# Patient Record
Sex: Female | Born: 1965
Health system: Southern US, Community
[De-identification: ages and names within clinical notes are randomized; demographics above are authoritative.]

## PROBLEM LIST (undated history)

## (undated) ENCOUNTER — Encounter

## (undated) ENCOUNTER — Ambulatory Visit

## (undated) ENCOUNTER — Encounter: Attending: Internal Medicine | Primary: Internal Medicine

## (undated) ENCOUNTER — Emergency Department (HOSPITAL_COMMUNITY): Payer: Medicare Other

## (undated) DIAGNOSIS — E119 Type 2 diabetes mellitus without complications: Secondary | ICD-10-CM

## (undated) DIAGNOSIS — I10 Essential (primary) hypertension: Secondary | ICD-10-CM

## (undated) DIAGNOSIS — I340 Nonrheumatic mitral (valve) insufficiency: Secondary | ICD-10-CM

## (undated) DIAGNOSIS — I739 Peripheral vascular disease, unspecified: Secondary | ICD-10-CM

## (undated) DIAGNOSIS — I5020 Unspecified systolic (congestive) heart failure: Secondary | ICD-10-CM

## (undated) DIAGNOSIS — E785 Hyperlipidemia, unspecified: Secondary | ICD-10-CM

## (undated) DIAGNOSIS — J90 Pleural effusion, not elsewhere classified: Secondary | ICD-10-CM

## (undated) DIAGNOSIS — N186 End stage renal disease: Secondary | ICD-10-CM

## (undated) HISTORY — PX: OTHER SURGICAL HISTORY: SHX169

## (undated) HISTORY — PX: TOE AMPUTATION: SHX809

## (undated) HISTORY — DX: Essential (primary) hypertension: I10

## (undated) HISTORY — DX: Hyperlipidemia, unspecified: E78.5

## (undated) HISTORY — PX: CHOLECYSTECTOMY: SHX55

## (undated) HISTORY — DX: Type 2 diabetes mellitus without complications: E11.9

---

## 2011-08-15 HISTORY — PX: COLONOSCOPY: SHX5424

## 2012-01-31 DIAGNOSIS — S98139A Complete traumatic amputation of one unspecified lesser toe, initial encounter: Secondary | ICD-10-CM

## 2012-01-31 HISTORY — DX: Complete traumatic amputation of one unspecified lesser toe, initial encounter: S98.139A

## 2013-09-11 DIAGNOSIS — I10 Essential (primary) hypertension: Secondary | ICD-10-CM | POA: Insufficient documentation

## 2013-09-11 DIAGNOSIS — E1121 Type 2 diabetes mellitus with diabetic nephropathy: Secondary | ICD-10-CM | POA: Insufficient documentation

## 2014-01-07 DIAGNOSIS — Z975 Presence of (intrauterine) contraceptive device: Secondary | ICD-10-CM | POA: Insufficient documentation

## 2014-04-01 DIAGNOSIS — Z8631 Personal history of diabetic foot ulcer: Secondary | ICD-10-CM | POA: Insufficient documentation

## 2014-04-01 HISTORY — DX: Personal history of diabetic foot ulcer: Z86.31

## 2014-08-28 LAB — HM COLONOSCOPY

## 2014-12-15 DIAGNOSIS — G4733 Obstructive sleep apnea (adult) (pediatric): Secondary | ICD-10-CM | POA: Insufficient documentation

## 2015-05-28 ENCOUNTER — Encounter: Payer: Self-pay | Admitting: Internal Medicine

## 2015-06-07 DIAGNOSIS — Z6841 Body Mass Index (BMI) 40.0 and over, adult: Secondary | ICD-10-CM | POA: Insufficient documentation

## 2015-07-09 ENCOUNTER — Other Ambulatory Visit: Payer: Self-pay | Admitting: Internal Medicine

## 2015-07-09 ENCOUNTER — Encounter: Payer: Self-pay | Admitting: Internal Medicine

## 2015-07-09 DIAGNOSIS — N189 Chronic kidney disease, unspecified: Secondary | ICD-10-CM | POA: Insufficient documentation

## 2015-07-09 DIAGNOSIS — E785 Hyperlipidemia, unspecified: Secondary | ICD-10-CM | POA: Insufficient documentation

## 2015-07-09 DIAGNOSIS — E1165 Type 2 diabetes mellitus with hyperglycemia: Secondary | ICD-10-CM

## 2015-07-09 DIAGNOSIS — E119 Type 2 diabetes mellitus without complications: Secondary | ICD-10-CM | POA: Insufficient documentation

## 2015-07-09 DIAGNOSIS — M179 Osteoarthritis of knee, unspecified: Secondary | ICD-10-CM | POA: Insufficient documentation

## 2015-07-09 DIAGNOSIS — E118 Type 2 diabetes mellitus with unspecified complications: Secondary | ICD-10-CM | POA: Insufficient documentation

## 2015-07-09 DIAGNOSIS — D631 Anemia in chronic kidney disease: Secondary | ICD-10-CM | POA: Insufficient documentation

## 2015-07-09 DIAGNOSIS — E1169 Type 2 diabetes mellitus with other specified complication: Secondary | ICD-10-CM | POA: Insufficient documentation

## 2015-07-09 DIAGNOSIS — E1122 Type 2 diabetes mellitus with diabetic chronic kidney disease: Secondary | ICD-10-CM | POA: Insufficient documentation

## 2015-07-09 DIAGNOSIS — M779 Enthesopathy, unspecified: Secondary | ICD-10-CM | POA: Insufficient documentation

## 2015-07-09 DIAGNOSIS — I130 Hypertensive heart and chronic kidney disease with heart failure and stage 1 through stage 4 chronic kidney disease, or unspecified chronic kidney disease: Secondary | ICD-10-CM | POA: Insufficient documentation

## 2015-07-09 DIAGNOSIS — J3089 Other allergic rhinitis: Secondary | ICD-10-CM | POA: Insufficient documentation

## 2015-07-09 DIAGNOSIS — N184 Chronic kidney disease, stage 4 (severe): Secondary | ICD-10-CM

## 2015-07-09 DIAGNOSIS — E1151 Type 2 diabetes mellitus with diabetic peripheral angiopathy without gangrene: Secondary | ICD-10-CM | POA: Insufficient documentation

## 2015-07-09 DIAGNOSIS — E559 Vitamin D deficiency, unspecified: Secondary | ICD-10-CM | POA: Insufficient documentation

## 2015-07-09 DIAGNOSIS — N186 End stage renal disease: Secondary | ICD-10-CM | POA: Insufficient documentation

## 2015-07-09 DIAGNOSIS — B009 Herpesviral infection, unspecified: Secondary | ICD-10-CM | POA: Insufficient documentation

## 2015-07-09 DIAGNOSIS — Z992 Dependence on renal dialysis: Secondary | ICD-10-CM | POA: Insufficient documentation

## 2015-07-09 DIAGNOSIS — M1710 Unilateral primary osteoarthritis, unspecified knee: Secondary | ICD-10-CM | POA: Insufficient documentation

## 2015-09-24 ENCOUNTER — Ambulatory Visit (INDEPENDENT_AMBULATORY_CARE_PROVIDER_SITE_OTHER): Payer: Federal, State, Local not specified - PPO | Admitting: Internal Medicine

## 2015-09-24 ENCOUNTER — Other Ambulatory Visit: Payer: Self-pay | Admitting: Internal Medicine

## 2015-09-24 ENCOUNTER — Encounter: Payer: Self-pay | Admitting: Internal Medicine

## 2015-09-24 VITALS — BP 124/70 | HR 80 | Ht 69.0 in | Wt 294.0 lb

## 2015-09-24 DIAGNOSIS — K5901 Slow transit constipation: Secondary | ICD-10-CM | POA: Diagnosis not present

## 2015-09-24 DIAGNOSIS — Z794 Long term (current) use of insulin: Secondary | ICD-10-CM

## 2015-09-24 DIAGNOSIS — E1122 Type 2 diabetes mellitus with diabetic chronic kidney disease: Secondary | ICD-10-CM

## 2015-09-24 DIAGNOSIS — E1121 Type 2 diabetes mellitus with diabetic nephropathy: Secondary | ICD-10-CM

## 2015-09-24 DIAGNOSIS — E1165 Type 2 diabetes mellitus with hyperglycemia: Secondary | ICD-10-CM | POA: Diagnosis not present

## 2015-09-24 DIAGNOSIS — N185 Chronic kidney disease, stage 5: Secondary | ICD-10-CM | POA: Insufficient documentation

## 2015-09-24 DIAGNOSIS — L739 Follicular disorder, unspecified: Secondary | ICD-10-CM | POA: Diagnosis not present

## 2015-09-24 DIAGNOSIS — E1129 Type 2 diabetes mellitus with other diabetic kidney complication: Secondary | ICD-10-CM | POA: Insufficient documentation

## 2015-09-24 DIAGNOSIS — N184 Chronic kidney disease, stage 4 (severe): Secondary | ICD-10-CM

## 2015-09-24 DIAGNOSIS — IMO0002 Reserved for concepts with insufficient information to code with codable children: Secondary | ICD-10-CM

## 2015-09-24 MED ORDER — LUBIPROSTONE 24 MCG PO CAPS: 24.0000 ug | ORAL_CAPSULE | Freq: Two times a day (BID) | ORAL | Status: DC

## 2015-09-24 MED ORDER — LEVOFLOXACIN 250 MG PO TABS: 250.0000 mg | ORAL_TABLET | Freq: Every day | ORAL | Status: DC

## 2015-09-24 NOTE — Progress Notes (Signed)
Date:  09/24/2015   Name:  Cindy Osborne   DOB:  01-23-1966   MRN:  [id number]   Chief Complaint: Abscess Patient developed infection under her left arm.  The area is tender and swollen but not draining.  She also has a smaller lesion under her right breast. Constipation - Patient plans constipation. The past several years she's had more difficulty despite using MiraLAX on a daily basis. If she does not take a laxative she has a bowel movement less than one time per week. She gets uncomfortable with upper abdominal fullness. She denies vomiting or blood in the stool. She has never tried another medication and is uncertain what to take with her renal disease. Renal insufficiency stage IV - she sees nephrology on a regular basis. She reports that her renal function is stable. She was declined the transplant list. She has a graft in place if dialysis is needed. Type 2 diabetes uncontrolled - she admits to poor control in part due to her diet. She sees endocrinology regularly. Her insulin was recently changed to NovoLog mix due to insurance issues.    Review of Systems  Constitutional: Negative for fever, chills and fatigue.  Eyes: Negative for visual disturbance.  Respiratory: Negative for cough, shortness of breath and wheezing.   Cardiovascular: Negative for chest pain and palpitations.  Gastrointestinal: Positive for constipation and abdominal distention. Negative for nausea, vomiting and blood in stool.  Genitourinary: Negative for dysuria.  Musculoskeletal: Negative for back pain.  Skin: Positive for color change and wound.    Patient Active Problem List   Diagnosis Date Noted  . DM (diabetes mellitus), type 2, uncontrolled, with renal complications ([place]) [id number]  . Allergic rhinitis 07/09/2015  . Anemia associated with chronic renal failure 07/09/2015  . Chronic kidney disease, stage IV (severe) ([place]) 07/09/2015  . Uncontrolled type 2 diabetes mellitus with diabetic  peripheral angiopathy without gangrene ([place]) 07/09/2015  . Herpes simplex infection 07/09/2015  . Hypercholesterolemia 07/09/2015  . Heart & renal disease, hypertensive, with heart failure ([place]) 07/09/2015  . Arthritis of knee, degenerative 07/09/2015  . Tendinitis 07/09/2015  . Avitaminosis D 07/09/2015  . Body mass index (BMI) of 40.0-44.9 in adult ([place]) 06/07/2015  . Obstructive apnea 12/15/2014  . History of diabetic ulcer of foot 04/01/2014  . Intracervical pessary 01/07/2014  . Hypertension 09/11/2013  . Amputated toe ([place]) 01/31/2012    Prior to Admission medications   Medication Sig Start Date End Date Taking? Authorizing Provider  amLODipine (NORVASC) 10 MG tablet Take 1 tablet by mouth daily.   Yes Historical Provider, MD  atorvastatin (LIPITOR) 10 MG tablet Take 1 tablet by mouth at bedtime. 09/21/15  Yes Historical Provider, MD  HUMULIN 70/30 KWIKPEN (70-30) 100 UNIT/ML PEN Dr Michiel Sites 08/04/15  Yes Historical Provider, MD  hydrochlorothiazide (HYDRODIURIL) 50 MG tablet Take 2 tablets by mouth daily.    Yes Historical Provider, MD  levonorgestrel (MIRENA, 52 MG,) 20 MCG/24HR IUD by Intrauterine route.   Yes Historical Provider, MD  lisinopril (PRINIVIL,ZESTRIL) 40 MG tablet Take 40 mg by mouth 2 (two) times daily. 09/07/15  Yes Historical Provider, MD  metoprolol succinate (TOPROL-XL) 100 MG 24 hr tablet Take 1.5 tablets by mouth daily. 06/04/15  Yes Historical Provider, MD  Vitamin D, Ergocalciferol, (DRISDOL) 50000 UNITS CAPS capsule Take 1 capsule by mouth once a week.   Yes Historical Provider, MD    Allergies  Allergen Reactions  . Atorvastatin     Other reaction(s): Other (See Comments)  Back pain  . Peanut-Containing Drug Products Itching  . Pioglitazone     Other reaction(s): Edema  . Rosuvastatin Itching  . Shellfish-Derived Products Rash    shrimp  . Sulfa Antibiotics Rash    Past Surgical History  Procedure Laterality Date  . Cholecystectomy    . Toe  amputation    . Avg for dialysis    . Colonoscopy  08/15/2011    cleared for 10 yrs    Social History  Substance Use Topics  . Smoking status: Never Smoker   . Smokeless tobacco: None  . Alcohol Use: 0.0 oz/week    0 Standard drinks or equivalent per week     Medication list has been reviewed and updated.   Physical Exam  Constitutional: She is oriented to person, place, and time. She appears well-developed. No distress.  HENT:  Head: Normocephalic and atraumatic.  Cardiovascular: Normal rate, regular rhythm and normal heart sounds.   Pulmonary/Chest: Effort normal and breath sounds normal. No respiratory distress.  Abdominal: Soft. Bowel sounds are normal.  Neurological: She is alert and oriented to person, place, and time.  Skin: Skin is warm and dry. No rash noted.     Psychiatric: She has a normal mood and affect. Her behavior is normal. Thought content normal.    BP 124/70 mmHg  Pulse 80  Ht 5\' 9"  (1.753 m)  Wt 294 lb (133.358 kg)  BMI 43.40 kg/m2  Assessment and Plan: 1. Folliculitis Renal dose levofloxacin Topical antibiotic cream - levofloxacin (LEVAQUIN) 250 MG tablet; Take 1 tablet (250 mg total) by mouth daily.  Dispense: 8 tablet; Refill: 0 (instructed differently due to renal insuff)  2. Uncontrolled type 2 diabetes mellitus with diabetic nephropathy, with long-term current use of insulin ([place]) Followed by Endocrinology  3. Chronic kidney disease, stage IV (severe) (HCC) Stable GFR 19; followed by Nephrology  4. Slow transit constipation Samples plus Rx of amitiza - lubiprostone (AMITIZA) 24 MCG capsule; Take 1 capsule (24 mcg total) by mouth 2 (two) times daily with a meal.  Dispense: 60 capsule; Refill: [place], MD [facility]  09/24/2015

## 2015-09-24 NOTE — Patient Instructions (Addendum)
Levofloxacin 250 mg - take 2 pills on the first day then one pill every 2 days until gone.  Amitiza - 24 mcg twice a day with food  Constipation, Adult Constipation is when a person has fewer than three bowel movements a week, has difficulty having a bowel movement, or has stools that are dry, hard, or larger than normal. As people grow older, constipation is more common. A low-fiber diet, not taking in enough fluids, and taking certain medicines may make constipation worse.  CAUSES   Certain medicines, such as antidepressants, pain medicine, iron supplements, antacids, and water pills.   Certain diseases, such as diabetes, irritable bowel syndrome (IBS), thyroid disease, or depression.   Not drinking enough water.   Not eating enough fiber-rich foods.   Stress or travel.   Lack of physical activity or exercise.   Ignoring the urge to have a bowel movement.   Using laxatives too much.  SIGNS AND SYMPTOMS   Having fewer than three bowel movements a week.   Straining to have a bowel movement.   Having stools that are hard, dry, or larger than normal.   Feeling full or bloated.   Pain in the lower abdomen.   Not feeling relief after having a bowel movement.  DIAGNOSIS  Your health care provider will take a medical history and perform a physical exam. Further testing may be done for severe constipation. Some tests may include:  A barium enema X-ray to examine your rectum, colon, and, sometimes, your small intestine.   A sigmoidoscopy to examine your lower colon.   A colonoscopy to examine your entire colon. TREATMENT  Treatment will depend on the severity of your constipation and what is causing it. Some dietary treatments include drinking more fluids and eating more fiber-rich foods. Lifestyle treatments may include regular exercise. If these diet and lifestyle recommendations do not help, your health care provider may recommend taking over-the-counter  laxative medicines to help you have bowel movements. Prescription medicines may be prescribed if over-the-counter medicines do not work.  HOME CARE INSTRUCTIONS   Eat foods that have a lot of fiber, such as fruits, vegetables, whole grains, and beans.  Limit foods high in fat and processed sugars, such as french fries, hamburgers, cookies, candies, and soda.   A fiber supplement may be added to your diet if you cannot get enough fiber from foods.   Drink enough fluids to keep your urine clear or pale yellow.   Exercise regularly or as directed by your health care provider.   Go to the restroom when you have the urge to go. Do not hold it.   Only take over-the-counter or prescription medicines as directed by your health care provider. Do not take other medicines for constipation without talking to your health care provider first.  [place] IF:   You have bright red blood in your stool.   Your constipation lasts for more than 4 days or gets worse.   You have abdominal or rectal pain.   You have thin, pencil-like stools.   You have unexplained weight loss. MAKE SURE YOU:   Understand these instructions.  Will watch your condition.  Will get help right away if you are not doing well or get worse.   This information is not intended to replace advice given to you by your health care provider. Make sure you discuss any questions you have with your health care provider.   Document Released: 04/28/2004 Document Revised: 08/21/2014 Document  Reviewed: 05/12/2013 Elsevier Interactive Patient Education [employer].

## 2015-11-12 ENCOUNTER — Ambulatory Visit (INDEPENDENT_AMBULATORY_CARE_PROVIDER_SITE_OTHER): Payer: Federal, State, Local not specified - PPO | Admitting: Internal Medicine

## 2015-11-12 ENCOUNTER — Encounter: Payer: Self-pay | Admitting: Internal Medicine

## 2015-11-12 VITALS — BP 118/70 | HR 102 | Temp 99.8°F | Ht 69.0 in | Wt 292.0 lb

## 2015-11-12 DIAGNOSIS — J40 Bronchitis, not specified as acute or chronic: Secondary | ICD-10-CM | POA: Diagnosis not present

## 2015-11-12 DIAGNOSIS — K5901 Slow transit constipation: Secondary | ICD-10-CM | POA: Diagnosis not present

## 2015-11-12 DIAGNOSIS — J101 Influenza due to other identified influenza virus with other respiratory manifestations: Secondary | ICD-10-CM

## 2015-11-12 LAB — POCT INFLUENZA A/B
INFLUENZA B, POC: NEGATIVE
Influenza A, POC: POSITIVE — AB

## 2015-11-12 MED ORDER — DOXYCYCLINE HYCLATE 100 MG PO TABS: 100.0000 mg | ORAL_TABLET | Freq: Two times a day (BID) | ORAL | Status: DC

## 2015-11-12 MED ORDER — LINACLOTIDE 290 MCG PO CAPS: 290.0000 ug | ORAL_CAPSULE | Freq: Every day | ORAL | Status: DC

## 2015-11-12 MED ORDER — HYDROCOD POLST-CPM POLST ER 10-8 MG/5ML PO SUER: 5.0000 mL | Freq: Two times a day (BID) | ORAL | Status: DC

## 2015-11-12 NOTE — Patient Instructions (Signed)

## 2015-11-12 NOTE — Progress Notes (Signed)
Date:  11/12/2015   Name:  Cindy Osborne   DOB:  [birth year]   MRN:  [id number]   Chief Complaint: Fever Fever  This is a new problem. The current episode started yesterday. The problem has been unchanged. The maximum temperature noted was 99 to 99.9 F. The temperature was taken using an oral thermometer. Associated symptoms include chest pain, congestion, coughing, headaches and muscle aches. Pertinent negatives include no diarrhea or sore throat. She has tried acetaminophen for the symptoms.   Constipation - did well with Amitiza but could not afford it.  Review of Systems  Constitutional: Positive for fever.  HENT: Positive for congestion and sinus pressure. Negative for sore throat and tinnitus.   Eyes: Negative for visual disturbance.  Respiratory: Positive for cough.   Cardiovascular: Positive for chest pain.  Gastrointestinal: Positive for constipation. Negative for diarrhea and blood in stool.  Musculoskeletal: Positive for myalgias. Negative for arthralgias.  Neurological: Positive for headaches. Negative for tremors and numbness.    Patient Active Problem List   Diagnosis Date Noted  . Uncontrolled type 2 diabetes mellitus with diabetic nephropathy, with long-term current use of insulin ([place]) 09/24/2015  . Slow transit constipation 09/24/2015  . Allergic rhinitis 07/09/2015  . Anemia associated with chronic renal failure 07/09/2015  . Chronic kidney disease, stage IV (severe) ([place]) 07/09/2015  . Uncontrolled type 2 diabetes mellitus with diabetic peripheral angiopathy without gangrene ([place]) 07/09/2015  . Herpes simplex infection 07/09/2015  . Hypercholesterolemia 07/09/2015  . Heart & renal disease, hypertensive, with heart failure ([place]) 07/09/2015  . Arthritis of knee, degenerative 07/09/2015  . Tendinitis 07/09/2015  . Avitaminosis D 07/09/2015  . Body mass index (BMI) of 40.0-44.9 in adult ([place]) 06/07/2015  . Obstructive apnea 12/15/2014  . History of diabetic  ulcer of foot 04/01/2014  . Intracervical pessary 01/07/2014  . Hypertension 09/11/2013  . Amputated toe ([place]) 01/31/2012    Prior to Admission medications   Medication Sig Start Date End Date Taking? Authorizing Provider  amLODipine (NORVASC) 10 MG tablet Take 1 tablet by mouth daily.   Yes Historical Provider, MD  atorvastatin (LIPITOR) 10 MG tablet Take 1 tablet by mouth at bedtime. 09/21/15  Yes Historical Provider, MD  HUMULIN 70/30 KWIKPEN (70-30) 100 UNIT/ML PEN Dr Michiel Sites 08/04/15  Yes Historical Provider, MD  hydrochlorothiazide (HYDRODIURIL) 50 MG tablet Take 2 tablets by mouth daily.    Yes Historical Provider, MD  levonorgestrel (MIRENA, 52 MG,) 20 MCG/24HR IUD by Intrauterine route.   Yes Historical Provider, MD  lisinopril (PRINIVIL,ZESTRIL) 40 MG tablet Take 40 mg by mouth 2 (two) times daily. 09/07/15  Yes Historical Provider, MD  lubiprostone (AMITIZA) 24 MCG capsule Take 1 capsule (24 mcg total) by mouth 2 (two) times daily with a meal. 09/24/15  Yes Glean Hess, MD  metoprolol succinate (TOPROL-XL) 100 MG 24 hr tablet Take 1.5 tablets by mouth daily. 06/04/15  Yes Historical Provider, MD  Vitamin D, Ergocalciferol, (DRISDOL) 50000 UNITS CAPS capsule Take 1 capsule by mouth once a week.   Yes Historical Provider, MD    Allergies  Allergen Reactions  . Atorvastatin     Other reaction(s): Other (See Comments) Back pain  . Peanut-Containing Drug Products Itching  . Pioglitazone     Other reaction(s): Edema  . Rosuvastatin Itching  . Shellfish-Derived Products Rash    shrimp  . Sulfa Antibiotics Rash    Past Surgical History  Procedure Laterality Date  . Cholecystectomy    . Toe amputation    .  Avg for dialysis    . Colonoscopy  08/15/2011    cleared for 10 yrs    Social History  Substance Use Topics  . Smoking status: Never Smoker   . Smokeless tobacco: None  . Alcohol Use: 0.0 oz/week    0 Standard drinks or equivalent per week     Medication list has  been reviewed and updated.   Physical Exam  Constitutional: She is oriented to person, place, and time. She appears well-developed and well-nourished. No distress.  HENT:  Head: Normocephalic and atraumatic.  Neck: Normal range of motion. Neck supple. No thyromegaly present.  Cardiovascular: Normal rate, regular rhythm and normal heart sounds.   Pulmonary/Chest: Effort normal. No respiratory distress. She has decreased breath sounds. She has no wheezes.  Musculoskeletal: Normal range of motion.  Lymphadenopathy:    She has no cervical adenopathy.  Neurological: She is alert and oriented to person, place, and time.  Skin: Skin is warm and dry. No rash noted.  Psychiatric: She has a normal mood and affect. Her speech is normal and behavior is normal. Thought content normal.  Nursing note and vitals reviewed.   BP 118/70 mmHg  Pulse 102  Temp(Src) 99.8 F (37.7 C) (Oral)  Ht 5\' 9"  (1.753 m)  Wt 292 lb (132.45 kg)  BMI 43.10 kg/m2  SpO2 99%  Assessment and Plan: 1. Influenza A Increase fluids, rest, out of work until 11/16/15 - chlorpheniramine-HYDROcodone (TUSSIONEX PENNKINETIC ER) 10-8 MG/5ML SUER; Take 5 mLs by mouth 2 (two) times daily.  Dispense: 473 mL; Refill: 0  2. Bronchitis - doxycycline (VIBRA-TABS) 100 MG tablet; Take 1 tablet (100 mg total) by mouth 2 (two) times daily.  Dispense: 20 tablet; Refill: 0  3. Slow transit constipation Samples of Linzess given - call for Rx if helpful  Halina Maidens, MD [facility]  11/12/2015

## 2016-01-12 ENCOUNTER — Ambulatory Visit (INDEPENDENT_AMBULATORY_CARE_PROVIDER_SITE_OTHER): Payer: Federal, State, Local not specified - PPO | Admitting: Internal Medicine

## 2016-01-12 ENCOUNTER — Encounter: Payer: Self-pay | Admitting: Internal Medicine

## 2016-01-12 VITALS — BP 144/78 | HR 88 | Ht 69.0 in | Wt 295.0 lb

## 2016-01-12 DIAGNOSIS — J018 Other acute sinusitis: Secondary | ICD-10-CM

## 2016-01-12 DIAGNOSIS — K5901 Slow transit constipation: Secondary | ICD-10-CM | POA: Diagnosis not present

## 2016-01-12 DIAGNOSIS — H109 Unspecified conjunctivitis: Secondary | ICD-10-CM | POA: Diagnosis not present

## 2016-01-12 MED ORDER — LINACLOTIDE 290 MCG PO CAPS: 290.0000 ug | ORAL_CAPSULE | Freq: Every day | ORAL | Status: DC

## 2016-01-12 MED ORDER — NEOMYCIN-POLYMYXIN-DEXAMETH 3.5-10000-0.1 OP SUSP: 2.0000 [drp] | Freq: Four times a day (QID) | OPHTHALMIC | Status: DC

## 2016-01-12 MED ORDER — AMOXICILLIN-POT CLAVULANATE 875-125 MG PO TABS: 1.0000 | ORAL_TABLET | Freq: Two times a day (BID) | ORAL | Status: DC

## 2016-01-12 NOTE — Progress Notes (Signed)
Date:  01/12/2016   Name:  Cindy Osborne   DOB:  1965/08/19   MRN:  [id number]   Chief Complaint: Sinusitis Sinusitis This is a new problem. The current episode started 1 to 4 weeks ago. The problem is unchanged. There has been no fever. Associated symptoms include congestion and sinus pressure. Pertinent negatives include no coughing or shortness of breath. (And foul odor from nares)  Conjunctivitis  The current episode started yesterday. Associated symptoms include eye itching, constipation, congestion, eye discharge, eye pain and eye redness. Pertinent negatives include no fever and no cough. The right eye is affected.  Constipation This is a chronic problem. The problem has been rapidly improving (after using Linzess) since onset. Pertinent negatives include no fever.     Review of Systems  Constitutional: Negative for fever.  HENT: Positive for congestion, postnasal drip and sinus pressure.   Eyes: Positive for pain, discharge, redness and itching.  Respiratory: Negative for cough, chest tightness and shortness of breath.   Cardiovascular: Negative for chest pain and palpitations.  Gastrointestinal: Positive for constipation.    Patient Active Problem List   Diagnosis Date Noted  . Uncontrolled type 2 diabetes mellitus with diabetic nephropathy, with long-term current use of insulin ([place]) 09/24/2015  . Slow transit constipation 09/24/2015  . Allergic rhinitis 07/09/2015  . Anemia associated with chronic renal failure 07/09/2015  . Chronic kidney disease, stage IV (severe) ([place]) 07/09/2015  . Uncontrolled type 2 diabetes mellitus with diabetic peripheral angiopathy without gangrene ([place]) 07/09/2015  . Herpes simplex infection 07/09/2015  . Hypercholesterolemia 07/09/2015  . Heart & renal disease, hypertensive, with heart failure ([place]) 07/09/2015  . Arthritis of knee, degenerative 07/09/2015  . Tendinitis 07/09/2015  . Avitaminosis D 07/09/2015  . Body mass index (BMI)  of 40.0-44.9 in adult ([place]) 06/07/2015  . Obstructive apnea 12/15/2014  . History of diabetic ulcer of foot 04/01/2014  . Intracervical pessary 01/07/2014  . Hypertension 09/11/2013  . Amputated toe ([place]) 01/31/2012    Prior to Admission medications   Medication Sig Start Date End Date Taking? Authorizing Provider  amLODipine (NORVASC) 10 MG tablet Take 1 tablet by mouth daily.   Yes Historical Provider, MD  atorvastatin (LIPITOR) 10 MG tablet Take 1 tablet by mouth at bedtime. 09/21/15  Yes Historical Provider, MD  chlorpheniramine-HYDROcodone (TUSSIONEX PENNKINETIC ER) 10-8 MG/5ML SUER Take 5 mLs by mouth 2 (two) times daily. 11/12/15  Yes Glean Hess, MD  HUMULIN 70/30 KWIKPEN (70-30) 100 UNIT/ML PEN Dr Michiel Sites 08/04/15  Yes Historical Provider, MD  hydrochlorothiazide (HYDRODIURIL) 50 MG tablet Take 2 tablets by mouth daily.    Yes Historical Provider, MD  levonorgestrel (MIRENA, 52 MG,) 20 MCG/24HR IUD by Intrauterine route.   Yes Historical Provider, MD  Linaclotide (LINZESS) 290 MCG CAPS capsule Take 1 capsule (290 mcg total) by mouth daily. 11/12/15  Yes Glean Hess, MD  lisinopril (PRINIVIL,ZESTRIL) 40 MG tablet Take 40 mg by mouth 2 (two) times daily. 09/07/15  Yes Historical Provider, MD  metoprolol succinate (TOPROL-XL) 100 MG 24 hr tablet Take 1.5 tablets by mouth daily. 06/04/15  Yes Historical Provider, MD  Vitamin D, Ergocalciferol, (DRISDOL) 50000 UNITS CAPS capsule Take 1 capsule by mouth once a week.   Yes Historical Provider, MD    Allergies  Allergen Reactions  . Atorvastatin     Other reaction(s): Other (See Comments) Back pain  . Peanut-Containing Drug Products Itching  . Pioglitazone     Other reaction(s): Edema  . Rosuvastatin  Itching  . Shellfish-Derived Products Rash    shrimp  . Sulfa Antibiotics Rash    Past Surgical History  Procedure Laterality Date  . Cholecystectomy    . Toe amputation    . Avg for dialysis    . Colonoscopy  08/15/2011     cleared for 10 yrs    Social History  Substance Use Topics  . Smoking status: Never Smoker   . Smokeless tobacco: None  . Alcohol Use: 0.0 oz/week    0 Standard drinks or equivalent per week    Medication list has been reviewed and updated.   Physical Exam  Constitutional: She is oriented to person, place, and time. She appears well-developed and well-nourished.  HENT:  Right Ear: External ear and ear canal normal. Tympanic membrane is not erythematous and not retracted.  Left Ear: External ear and ear canal normal. Tympanic membrane is not erythematous and not retracted.  Nose: Right sinus exhibits maxillary sinus tenderness and frontal sinus tenderness. Left sinus exhibits maxillary sinus tenderness and frontal sinus tenderness.  Mouth/Throat: Uvula is midline and mucous membranes are normal. No oral lesions. No oropharyngeal exudate or posterior oropharyngeal erythema.  Eyes: Right conjunctiva is injected. Right conjunctiva has no hemorrhage. Right eye exhibits no nystagmus.  Cardiovascular: Normal rate, regular rhythm and normal heart sounds.   Pulmonary/Chest: Breath sounds normal. She has no wheezes. She has no rales.  Musculoskeletal: She exhibits no edema or tenderness.  Lymphadenopathy:    She has no cervical adenopathy.  Neurological: She is alert and oriented to person, place, and time.  Psychiatric: She has a normal mood and affect. Her speech is normal.    BP 144/78 mmHg  Pulse 88  Ht 5\' 9"  (1.753 m)  Wt 295 lb (133.811 kg)  BMI 43.54 kg/m2  Assessment and Plan: 1. Conjunctivitis of right eye Continue warm compresses as needed - neomycin-polymyxin b-dexamethasone (MAXITROL) 3.5-10000-0.1 SUSP; Place 2 drops into the right eye every 6 (six) hours.  Dispense: 5 mL; Refill: 0  2. Other acute sinusitis - amoxicillin-clavulanate (AUGMENTIN) 875-125 MG tablet; Take 1 tablet by mouth 2 (two) times daily.  Dispense: 20 tablet; Refill: 0  3. Slow transit  constipation - linaclotide (LINZESS) 290 MCG CAPS capsule; Take 1 capsule (290 mcg total) by mouth daily.  Dispense: 30 capsule; Refill: [place], MD [facility]  01/12/2016

## 2016-01-26 ENCOUNTER — Telehealth: Payer: Self-pay

## 2016-01-26 NOTE — Telephone Encounter (Signed)
Pt called said she was recently seen, pt said she went to the eye doctor due to her eyes being puffy in the morning and swelling at night time and gave her eye drops, pt said her eye drops are helping throughout the day but start backs at night time pt is wanting to know if she could possibly have a ct scan done.

## 2016-01-26 NOTE — Telephone Encounter (Signed)
Cindy Osborne called and advised to go to [facility]

## 2016-05-26 DIAGNOSIS — D631 Anemia in chronic kidney disease: Secondary | ICD-10-CM | POA: Diagnosis not present

## 2016-05-26 DIAGNOSIS — N185 Chronic kidney disease, stage 5: Secondary | ICD-10-CM | POA: Diagnosis not present

## 2016-05-31 DIAGNOSIS — E1140 Type 2 diabetes mellitus with diabetic neuropathy, unspecified: Secondary | ICD-10-CM | POA: Diagnosis not present

## 2016-05-31 DIAGNOSIS — N185 Chronic kidney disease, stage 5: Secondary | ICD-10-CM | POA: Diagnosis not present

## 2016-05-31 DIAGNOSIS — I129 Hypertensive chronic kidney disease with stage 1 through stage 4 chronic kidney disease, or unspecified chronic kidney disease: Secondary | ICD-10-CM | POA: Diagnosis not present

## 2016-05-31 DIAGNOSIS — D631 Anemia in chronic kidney disease: Secondary | ICD-10-CM | POA: Diagnosis not present

## 2016-06-20 ENCOUNTER — Encounter: Payer: Self-pay | Admitting: Internal Medicine

## 2016-06-20 ENCOUNTER — Ambulatory Visit (INDEPENDENT_AMBULATORY_CARE_PROVIDER_SITE_OTHER): Payer: Federal, State, Local not specified - PPO | Admitting: Internal Medicine

## 2016-06-20 VITALS — BP 142/80 | HR 70 | Ht 69.0 in | Wt 298.0 lb

## 2016-06-20 DIAGNOSIS — H1033 Unspecified acute conjunctivitis, bilateral: Secondary | ICD-10-CM | POA: Diagnosis not present

## 2016-06-20 DIAGNOSIS — E1165 Type 2 diabetes mellitus with hyperglycemia: Secondary | ICD-10-CM

## 2016-06-20 DIAGNOSIS — Z794 Long term (current) use of insulin: Secondary | ICD-10-CM

## 2016-06-20 DIAGNOSIS — E1121 Type 2 diabetes mellitus with diabetic nephropathy: Secondary | ICD-10-CM

## 2016-06-20 DIAGNOSIS — J3089 Other allergic rhinitis: Secondary | ICD-10-CM

## 2016-06-20 DIAGNOSIS — IMO0002 Reserved for concepts with insufficient information to code with codable children: Secondary | ICD-10-CM

## 2016-06-20 DIAGNOSIS — L989 Disorder of the skin and subcutaneous tissue, unspecified: Secondary | ICD-10-CM

## 2016-06-20 DIAGNOSIS — N184 Chronic kidney disease, stage 4 (severe): Secondary | ICD-10-CM | POA: Diagnosis not present

## 2016-06-20 MED ORDER — CIPROFLOXACIN HCL 0.3 % OP SOLN: 2.0000 [drp] | OPHTHALMIC | 1 refills | Status: DC

## 2016-06-20 MED ORDER — BENZONATATE 100 MG PO CAPS: 200.0000 mg | ORAL_CAPSULE | Freq: Every day | ORAL | 5 refills | Status: DC

## 2016-06-20 NOTE — Progress Notes (Signed)
Date:  06/20/2016   Name:  Cindy Osborne   DOB:  Jan 01, 1966   MRN:  [id number]   Chief Complaint: Allergic Rhinitis  (both eyes swollen with "gunk in both" more in the mornings.) and Cyst (several keep popping up at bra line, heat compress helps them go down.)  Eye Problem   Both eyes are affected.This is a recurrent problem. The problem has been waxing and waning. There was no injury mechanism. The pain is mild. Associated symptoms include blurred vision, an eye discharge, eye redness and itching. Pertinent negatives include no fever.  Rash  This is a recurrent (she gets swellings under her breast from bra - she uses warm compresses and they go away after a few days.  There is no drainage or odor.  They receed.) problem. The affected locations include the chest (boils under breast). Associated symptoms include coughing. Pertinent negatives include no congestion, fatigue, fever, shortness of breath or sore throat.  Diabetes  She presents for her follow-up diabetic visit. She has type 2 diabetes mellitus. Her disease course has been improving. Pertinent negatives for hypoglycemia include no nervousness/anxiousness. Associated symptoms include blurred vision. Pertinent negatives for diabetes include no chest pain and no fatigue. Compliance with diabetes treatment: last A1C at [facility] 8.1.  Allergies - has been tested and has no specific allergies.  She takes claritin daily.  She also has post nasal drainage which is worse at night and causes a cough.  She has uses Tussionex for some time at night and requests a refill.  I advised it is not prudent to take this type of medication indefinitely.   Review of Systems  Constitutional: Negative for chills, fatigue and fever.  HENT: Positive for postnasal drip. Negative for congestion, sinus pressure, sore throat and trouble swallowing.   Eyes: Positive for blurred vision, discharge, redness and itching.  Respiratory: Positive for cough. Negative for  chest tightness, shortness of breath and wheezing.   Cardiovascular: Negative for chest pain and palpitations.  Gastrointestinal: Negative for abdominal pain.  Skin: Positive for rash.  Psychiatric/Behavioral: Positive for sleep disturbance. Negative for dysphoric mood. The patient is not nervous/anxious.     Patient Active Problem List   Diagnosis Date Noted  . Uncontrolled type 2 diabetes mellitus with diabetic nephropathy, with long-term current use of insulin ([place]) 09/24/2015  . Slow transit constipation 09/24/2015  . Allergic rhinitis 07/09/2015  . Anemia associated with chronic renal failure 07/09/2015  . Chronic kidney disease, stage IV (severe) ([place]) 07/09/2015  . Uncontrolled type 2 diabetes mellitus with diabetic peripheral angiopathy without gangrene ([place]) 07/09/2015  . Herpes simplex infection 07/09/2015  . Hypercholesterolemia 07/09/2015  . Heart & renal disease, hypertensive, with heart failure ([place]) 07/09/2015  . Arthritis of knee, degenerative 07/09/2015  . Tendinitis 07/09/2015  . Avitaminosis D 07/09/2015  . Body mass index (BMI) of 40.0-44.9 in adult ([place]) 06/07/2015  . Obstructive apnea 12/15/2014  . History of diabetic ulcer of foot 04/01/2014  . Intracervical pessary 01/07/2014  . Hypertension 09/11/2013  . Amputated toe ([place]) 01/31/2012    Prior to Admission medications   Medication Sig Start Date End Date Taking? Authorizing Provider  amLODipine (NORVASC) 10 MG tablet Take 1 tablet by mouth daily.   Yes Historical Provider, MD  amoxicillin-clavulanate (AUGMENTIN) 875-125 MG tablet Take 1 tablet by mouth 2 (two) times daily. 01/12/16  Yes Glean Hess, MD  atorvastatin (LIPITOR) 10 MG tablet Take 1 tablet by mouth at bedtime. 09/21/15  Yes Historical Provider,  MD  chlorpheniramine-HYDROcodone (TUSSIONEX PENNKINETIC ER) 10-8 MG/5ML SUER Take 5 mLs by mouth 2 (two) times daily. 11/12/15  Yes Glean Hess, MD  HUMULIN 70/30 KWIKPEN (70-30) 100 UNIT/ML PEN Dr  Michiel Sites 08/04/15  Yes Historical Provider, MD  hydrochlorothiazide (HYDRODIURIL) 50 MG tablet Take 2 tablets by mouth daily.    Yes Historical Provider, MD  levonorgestrel (MIRENA, 52 MG,) 20 MCG/24HR IUD by Intrauterine route.   Yes Historical Provider, MD  linaclotide (LINZESS) 290 MCG CAPS capsule Take 1 capsule (290 mcg total) by mouth daily. 01/12/16  Yes Glean Hess, MD  lisinopril (PRINIVIL,ZESTRIL) 40 MG tablet Take 40 mg by mouth 2 (two) times daily. 09/07/15  Yes Historical Provider, MD  metoprolol succinate (TOPROL-XL) 100 MG 24 hr tablet Take 1.5 tablets by mouth daily. 06/04/15  Yes Historical Provider, MD  neomycin-polymyxin b-dexamethasone (MAXITROL) 3.5-10000-0.1 SUSP Place 2 drops into the right eye every 6 (six) hours. 01/12/16  Yes Glean Hess, MD  Vitamin D, Ergocalciferol, (DRISDOL) 50000 UNITS CAPS capsule Take 1 capsule by mouth once a week.   Yes Historical Provider, MD    Allergies  Allergen Reactions  . Atorvastatin     Other reaction(s): Other (See Comments) Back pain  . Peanut-Containing Drug Products Itching  . Pioglitazone     Other reaction(s): Edema  . Rosuvastatin Itching  . Shellfish-Derived Products Rash    shrimp  . Sulfa Antibiotics Rash    Past Surgical History:  Procedure Laterality Date  . avg for dialysis    . CHOLECYSTECTOMY    . COLONOSCOPY  08/15/2011   cleared for 10 yrs  . TOE AMPUTATION      Social History  Substance Use Topics  . Smoking status: Never Smoker  . Smokeless tobacco: Never Used  . Alcohol use 0.0 oz/week     Medication list has been reviewed and updated.   Physical Exam  Constitutional: She is oriented to person, place, and time. She appears well-developed. No distress.  HENT:  Head: Normocephalic and atraumatic.  Eyes:  Eyelids swollen and inflamed.  Crusting seen on both eyes.  Conjunctiva red and tearing.  Neck: Normal range of motion. No thyromegaly present.  Cardiovascular: Normal rate, regular  rhythm and normal heart sounds.   Pulmonary/Chest: Effort normal and breath sounds normal. No respiratory distress. She has no wheezes.  Musculoskeletal: Normal range of motion.  Neurological: She is alert and oriented to person, place, and time.  Skin: Skin is warm and dry. No rash noted.  Skin under breasts is normal - no color change, rash or lesions.  Psychiatric: She has a normal mood and affect. Her speech is normal and behavior is normal. Thought content normal.  Nursing note and vitals reviewed.   BP (!) 142/80   Pulse 70   Ht 5\' 9"  (1.753 m)   Wt 298 lb (135.2 kg)   SpO2 100%   BMI 44.01 kg/m   Assessment and Plan: 1. Environmental and seasonal allergies Continue claritin Try tessalon instead of tussionex - benzonatate (TESSALON) 100 MG capsule; Take 2 capsules (200 mg total) by mouth at bedtime.  Dispense: 30 capsule; Refill: 5  2. Acute conjunctivitis of both eyes, unspecified acute conjunctivitis type - ciprofloxacin (CILOXAN) 0.3 % ophthalmic solution; Place 2 drops into both eyes every 4 (four) hours while awake.  Dispense: 10 mL; Refill: 1  3. Uncontrolled type 2 diabetes mellitus with diabetic nephropathy, with long-term current use of insulin ([place]) Continue [facility] at [facility]  4.  Chronic kidney disease, stage IV (severe) (HCC) Stable - followed by Nephrology  5. Skin lesion of chest wall Uncertain significance - could consult Dermatology   Halina Maidens, MD [facility]  06/20/2016

## 2016-06-29 DIAGNOSIS — H1033 Unspecified acute conjunctivitis, bilateral: Secondary | ICD-10-CM | POA: Diagnosis not present

## 2016-07-05 DIAGNOSIS — H10013 Acute follicular conjunctivitis, bilateral: Secondary | ICD-10-CM | POA: Diagnosis not present

## 2016-07-11 DIAGNOSIS — Z975 Presence of (intrauterine) contraceptive device: Secondary | ICD-10-CM | POA: Diagnosis not present

## 2016-07-11 DIAGNOSIS — Z01419 Encounter for gynecological examination (general) (routine) without abnormal findings: Secondary | ICD-10-CM | POA: Diagnosis not present

## 2016-07-11 DIAGNOSIS — E119 Type 2 diabetes mellitus without complications: Secondary | ICD-10-CM | POA: Diagnosis not present

## 2016-07-11 DIAGNOSIS — Z1231 Encounter for screening mammogram for malignant neoplasm of breast: Secondary | ICD-10-CM | POA: Diagnosis not present

## 2016-07-14 DIAGNOSIS — D631 Anemia in chronic kidney disease: Secondary | ICD-10-CM | POA: Diagnosis not present

## 2016-07-14 DIAGNOSIS — D539 Nutritional anemia, unspecified: Secondary | ICD-10-CM | POA: Diagnosis not present

## 2016-07-14 DIAGNOSIS — N185 Chronic kidney disease, stage 5: Secondary | ICD-10-CM | POA: Diagnosis not present

## 2016-07-14 DIAGNOSIS — H0014 Chalazion left upper eyelid: Secondary | ICD-10-CM | POA: Diagnosis not present

## 2016-07-19 DIAGNOSIS — I770 Arteriovenous fistula, acquired: Secondary | ICD-10-CM | POA: Diagnosis not present

## 2016-07-19 DIAGNOSIS — I129 Hypertensive chronic kidney disease with stage 1 through stage 4 chronic kidney disease, or unspecified chronic kidney disease: Secondary | ICD-10-CM | POA: Diagnosis not present

## 2016-07-19 DIAGNOSIS — N185 Chronic kidney disease, stage 5: Secondary | ICD-10-CM | POA: Diagnosis not present

## 2016-07-19 DIAGNOSIS — E1140 Type 2 diabetes mellitus with diabetic neuropathy, unspecified: Secondary | ICD-10-CM | POA: Diagnosis not present

## 2016-08-15 DIAGNOSIS — H00014 Hordeolum externum left upper eyelid: Secondary | ICD-10-CM | POA: Diagnosis not present

## 2016-08-15 DIAGNOSIS — H02539 Eyelid retraction unspecified eye, unspecified lid: Secondary | ICD-10-CM | POA: Diagnosis not present

## 2016-08-15 DIAGNOSIS — H02839 Dermatochalasis of unspecified eye, unspecified eyelid: Secondary | ICD-10-CM | POA: Diagnosis not present

## 2016-08-15 DIAGNOSIS — H029 Unspecified disorder of eyelid: Secondary | ICD-10-CM | POA: Diagnosis not present

## 2016-08-24 DIAGNOSIS — N185 Chronic kidney disease, stage 5: Secondary | ICD-10-CM | POA: Diagnosis not present

## 2016-08-29 DIAGNOSIS — N185 Chronic kidney disease, stage 5: Secondary | ICD-10-CM | POA: Diagnosis not present

## 2016-08-29 DIAGNOSIS — D631 Anemia in chronic kidney disease: Secondary | ICD-10-CM | POA: Diagnosis not present

## 2016-08-29 DIAGNOSIS — I129 Hypertensive chronic kidney disease with stage 1 through stage 4 chronic kidney disease, or unspecified chronic kidney disease: Secondary | ICD-10-CM | POA: Diagnosis not present

## 2016-08-29 DIAGNOSIS — E1140 Type 2 diabetes mellitus with diabetic neuropathy, unspecified: Secondary | ICD-10-CM | POA: Diagnosis not present

## 2016-09-06 DIAGNOSIS — B079 Viral wart, unspecified: Secondary | ICD-10-CM | POA: Diagnosis not present

## 2016-09-06 DIAGNOSIS — K080 Exfoliation of teeth due to systemic causes: Secondary | ICD-10-CM | POA: Diagnosis not present

## 2016-09-06 DIAGNOSIS — L97512 Non-pressure chronic ulcer of other part of right foot with fat layer exposed: Secondary | ICD-10-CM | POA: Diagnosis not present

## 2016-09-06 DIAGNOSIS — E11621 Type 2 diabetes mellitus with foot ulcer: Secondary | ICD-10-CM | POA: Diagnosis not present

## 2016-09-06 DIAGNOSIS — M2021 Hallux rigidus, right foot: Secondary | ICD-10-CM | POA: Diagnosis not present

## 2016-09-06 DIAGNOSIS — E119 Type 2 diabetes mellitus without complications: Secondary | ICD-10-CM | POA: Diagnosis not present

## 2016-09-15 DIAGNOSIS — L98491 Non-pressure chronic ulcer of skin of other sites limited to breakdown of skin: Secondary | ICD-10-CM | POA: Diagnosis not present

## 2016-10-05 DIAGNOSIS — N185 Chronic kidney disease, stage 5: Secondary | ICD-10-CM | POA: Diagnosis not present

## 2016-10-10 DIAGNOSIS — I129 Hypertensive chronic kidney disease with stage 1 through stage 4 chronic kidney disease, or unspecified chronic kidney disease: Secondary | ICD-10-CM | POA: Diagnosis not present

## 2016-10-10 DIAGNOSIS — E1140 Type 2 diabetes mellitus with diabetic neuropathy, unspecified: Secondary | ICD-10-CM | POA: Diagnosis not present

## 2016-10-10 DIAGNOSIS — N185 Chronic kidney disease, stage 5: Secondary | ICD-10-CM | POA: Diagnosis not present

## 2016-10-10 DIAGNOSIS — D631 Anemia in chronic kidney disease: Secondary | ICD-10-CM | POA: Diagnosis not present

## 2016-11-23 ENCOUNTER — Encounter: Payer: Self-pay | Admitting: Internal Medicine

## 2016-11-23 DIAGNOSIS — E11319 Type 2 diabetes mellitus with unspecified diabetic retinopathy without macular edema: Secondary | ICD-10-CM | POA: Insufficient documentation

## 2016-11-28 DIAGNOSIS — G4733 Obstructive sleep apnea (adult) (pediatric): Secondary | ICD-10-CM | POA: Diagnosis not present

## 2016-11-30 DIAGNOSIS — N185 Chronic kidney disease, stage 5: Secondary | ICD-10-CM | POA: Diagnosis not present

## 2016-12-05 DIAGNOSIS — E1140 Type 2 diabetes mellitus with diabetic neuropathy, unspecified: Secondary | ICD-10-CM | POA: Diagnosis not present

## 2016-12-05 DIAGNOSIS — N185 Chronic kidney disease, stage 5: Secondary | ICD-10-CM | POA: Diagnosis not present

## 2016-12-05 DIAGNOSIS — I129 Hypertensive chronic kidney disease with stage 1 through stage 4 chronic kidney disease, or unspecified chronic kidney disease: Secondary | ICD-10-CM | POA: Diagnosis not present

## 2017-01-10 DIAGNOSIS — J310 Chronic rhinitis: Secondary | ICD-10-CM | POA: Diagnosis not present

## 2017-01-10 DIAGNOSIS — N185 Chronic kidney disease, stage 5: Secondary | ICD-10-CM | POA: Diagnosis not present

## 2017-01-10 DIAGNOSIS — G4733 Obstructive sleep apnea (adult) (pediatric): Secondary | ICD-10-CM | POA: Diagnosis not present

## 2017-01-10 DIAGNOSIS — Z6841 Body Mass Index (BMI) 40.0 and over, adult: Secondary | ICD-10-CM | POA: Diagnosis not present

## 2017-01-16 DIAGNOSIS — I129 Hypertensive chronic kidney disease with stage 1 through stage 4 chronic kidney disease, or unspecified chronic kidney disease: Secondary | ICD-10-CM | POA: Diagnosis not present

## 2017-01-16 DIAGNOSIS — D631 Anemia in chronic kidney disease: Secondary | ICD-10-CM | POA: Diagnosis not present

## 2017-01-16 DIAGNOSIS — N185 Chronic kidney disease, stage 5: Secondary | ICD-10-CM | POA: Diagnosis not present

## 2017-01-16 DIAGNOSIS — I770 Arteriovenous fistula, acquired: Secondary | ICD-10-CM | POA: Diagnosis not present

## 2017-03-16 DIAGNOSIS — N185 Chronic kidney disease, stage 5: Secondary | ICD-10-CM | POA: Diagnosis not present

## 2017-03-21 DIAGNOSIS — E1140 Type 2 diabetes mellitus with diabetic neuropathy, unspecified: Secondary | ICD-10-CM | POA: Diagnosis not present

## 2017-03-21 DIAGNOSIS — Z6841 Body Mass Index (BMI) 40.0 and over, adult: Secondary | ICD-10-CM | POA: Diagnosis not present

## 2017-03-21 DIAGNOSIS — E1029 Type 1 diabetes mellitus with other diabetic kidney complication: Secondary | ICD-10-CM | POA: Diagnosis not present

## 2017-03-21 DIAGNOSIS — I129 Hypertensive chronic kidney disease with stage 1 through stage 4 chronic kidney disease, or unspecified chronic kidney disease: Secondary | ICD-10-CM | POA: Diagnosis not present

## 2017-03-21 DIAGNOSIS — I10 Essential (primary) hypertension: Secondary | ICD-10-CM | POA: Diagnosis not present

## 2017-03-21 DIAGNOSIS — G4733 Obstructive sleep apnea (adult) (pediatric): Secondary | ICD-10-CM | POA: Diagnosis not present

## 2017-03-21 DIAGNOSIS — E1065 Type 1 diabetes mellitus with hyperglycemia: Secondary | ICD-10-CM | POA: Diagnosis not present

## 2017-03-21 DIAGNOSIS — N185 Chronic kidney disease, stage 5: Secondary | ICD-10-CM | POA: Diagnosis not present

## 2017-03-26 DIAGNOSIS — N184 Chronic kidney disease, stage 4 (severe): Secondary | ICD-10-CM | POA: Diagnosis not present

## 2017-03-26 DIAGNOSIS — G4733 Obstructive sleep apnea (adult) (pediatric): Secondary | ICD-10-CM | POA: Diagnosis not present

## 2017-03-26 DIAGNOSIS — M2021 Hallux rigidus, right foot: Secondary | ICD-10-CM | POA: Diagnosis not present

## 2017-03-26 DIAGNOSIS — E538 Deficiency of other specified B group vitamins: Secondary | ICD-10-CM | POA: Diagnosis not present

## 2017-03-26 DIAGNOSIS — B079 Viral wart, unspecified: Secondary | ICD-10-CM | POA: Diagnosis not present

## 2017-03-26 DIAGNOSIS — E1021 Type 1 diabetes mellitus with diabetic nephropathy: Secondary | ICD-10-CM | POA: Diagnosis not present

## 2017-03-26 DIAGNOSIS — E1022 Type 1 diabetes mellitus with diabetic chronic kidney disease: Secondary | ICD-10-CM | POA: Diagnosis not present

## 2017-03-26 DIAGNOSIS — L97512 Non-pressure chronic ulcer of other part of right foot with fat layer exposed: Secondary | ICD-10-CM | POA: Diagnosis not present

## 2017-03-26 DIAGNOSIS — I129 Hypertensive chronic kidney disease with stage 1 through stage 4 chronic kidney disease, or unspecified chronic kidney disease: Secondary | ICD-10-CM | POA: Diagnosis not present

## 2017-03-26 DIAGNOSIS — E119 Type 2 diabetes mellitus without complications: Secondary | ICD-10-CM | POA: Diagnosis not present

## 2017-03-26 DIAGNOSIS — Z6841 Body Mass Index (BMI) 40.0 and over, adult: Secondary | ICD-10-CM | POA: Diagnosis not present

## 2017-03-26 DIAGNOSIS — I10 Essential (primary) hypertension: Secondary | ICD-10-CM | POA: Diagnosis not present

## 2017-03-26 DIAGNOSIS — E1065 Type 1 diabetes mellitus with hyperglycemia: Secondary | ICD-10-CM | POA: Diagnosis not present

## 2017-03-26 DIAGNOSIS — E782 Mixed hyperlipidemia: Secondary | ICD-10-CM | POA: Diagnosis not present

## 2017-03-26 DIAGNOSIS — E1029 Type 1 diabetes mellitus with other diabetic kidney complication: Secondary | ICD-10-CM | POA: Diagnosis not present

## 2017-04-08 DIAGNOSIS — S46812A Strain of other muscles, fascia and tendons at shoulder and upper arm level, left arm, initial encounter: Secondary | ICD-10-CM | POA: Diagnosis not present

## 2017-04-08 DIAGNOSIS — S29012A Strain of muscle and tendon of back wall of thorax, initial encounter: Secondary | ICD-10-CM | POA: Diagnosis not present

## 2017-04-08 DIAGNOSIS — M25519 Pain in unspecified shoulder: Secondary | ICD-10-CM | POA: Diagnosis not present

## 2017-04-08 DIAGNOSIS — S46811A Strain of other muscles, fascia and tendons at shoulder and upper arm level, right arm, initial encounter: Secondary | ICD-10-CM | POA: Diagnosis not present

## 2017-05-25 DIAGNOSIS — N185 Chronic kidney disease, stage 5: Secondary | ICD-10-CM | POA: Diagnosis not present

## 2017-05-30 DIAGNOSIS — N185 Chronic kidney disease, stage 5: Secondary | ICD-10-CM | POA: Diagnosis not present

## 2017-05-30 DIAGNOSIS — I770 Arteriovenous fistula, acquired: Secondary | ICD-10-CM | POA: Diagnosis not present

## 2017-05-30 DIAGNOSIS — E1140 Type 2 diabetes mellitus with diabetic neuropathy, unspecified: Secondary | ICD-10-CM | POA: Diagnosis not present

## 2017-05-30 DIAGNOSIS — I129 Hypertensive chronic kidney disease with stage 1 through stage 4 chronic kidney disease, or unspecified chronic kidney disease: Secondary | ICD-10-CM | POA: Diagnosis not present

## 2017-06-11 DIAGNOSIS — Z6841 Body Mass Index (BMI) 40.0 and over, adult: Secondary | ICD-10-CM | POA: Diagnosis not present

## 2017-06-11 DIAGNOSIS — J310 Chronic rhinitis: Secondary | ICD-10-CM | POA: Diagnosis not present

## 2017-06-11 DIAGNOSIS — G4733 Obstructive sleep apnea (adult) (pediatric): Secondary | ICD-10-CM | POA: Diagnosis not present

## 2017-06-25 ENCOUNTER — Ambulatory Visit: Payer: Federal, State, Local not specified - PPO | Admitting: Internal Medicine

## 2017-06-25 DIAGNOSIS — B9561 Methicillin susceptible Staphylococcus aureus infection as the cause of diseases classified elsewhere: Secondary | ICD-10-CM | POA: Diagnosis not present

## 2017-06-25 DIAGNOSIS — L0201 Cutaneous abscess of face: Secondary | ICD-10-CM | POA: Diagnosis not present

## 2017-08-03 DIAGNOSIS — N185 Chronic kidney disease, stage 5: Secondary | ICD-10-CM | POA: Diagnosis not present

## 2017-08-08 DIAGNOSIS — E1140 Type 2 diabetes mellitus with diabetic neuropathy, unspecified: Secondary | ICD-10-CM | POA: Diagnosis not present

## 2017-08-08 DIAGNOSIS — N185 Chronic kidney disease, stage 5: Secondary | ICD-10-CM | POA: Diagnosis not present

## 2017-08-08 DIAGNOSIS — I129 Hypertensive chronic kidney disease with stage 1 through stage 4 chronic kidney disease, or unspecified chronic kidney disease: Secondary | ICD-10-CM | POA: Diagnosis not present

## 2017-08-08 DIAGNOSIS — I770 Arteriovenous fistula, acquired: Secondary | ICD-10-CM | POA: Diagnosis not present

## 2017-09-26 ENCOUNTER — Ambulatory Visit: Payer: Federal, State, Local not specified - PPO | Admitting: Internal Medicine

## 2017-09-26 ENCOUNTER — Encounter: Payer: Self-pay | Admitting: Internal Medicine

## 2017-09-26 VITALS — BP 112/68 | HR 76 | Temp 98.0°F | Ht 69.0 in | Wt 282.0 lb

## 2017-09-26 DIAGNOSIS — J0100 Acute maxillary sinusitis, unspecified: Secondary | ICD-10-CM

## 2017-09-26 MED ORDER — HYDROCOD POLST-CPM POLST ER 10-8 MG/5ML PO SUER: 5.0000 mL | Freq: Two times a day (BID) | ORAL | 0 refills | Status: DC

## 2017-09-26 MED ORDER — AMOXICILLIN-POT CLAVULANATE 875-125 MG PO TABS: 1.0000 | ORAL_TABLET | Freq: Two times a day (BID) | ORAL | 0 refills | Status: AC

## 2017-09-26 NOTE — Progress Notes (Signed)
Date:  09/26/2017   Name:  Cindy Osborne   DOB:  1966/02/20   MRN:  [id number]   Chief Complaint: Cough (Started Thursday. Drainage, sinus pressure. Cough- clear mucous. Samll headache at times and eyes watery.)  Cough  This is a new problem. The current episode started in the past 7 days. The problem has been unchanged. The problem occurs every few minutes. The cough is productive of sputum. Associated symptoms include a sore throat. Pertinent negatives include no chills, fever, shortness of breath or wheezing. Her past medical history is significant for environmental allergies.     Review of Systems  Constitutional: Negative for chills, fatigue and fever.  HENT: Positive for congestion, sinus pressure and sore throat.   Eyes: Negative for visual disturbance.  Respiratory: Positive for cough. Negative for shortness of breath and wheezing.   Cardiovascular: Negative for palpitations and leg swelling.  Gastrointestinal: Negative for abdominal pain.  Allergic/Immunologic: Positive for environmental allergies.    Patient Active Problem List   Diagnosis Date Noted  . Diabetic retinopathy ([place]) 11/23/2016  . Uncontrolled type 2 diabetes mellitus with diabetic nephropathy, with long-term current use of insulin ([place]) 09/24/2015  . Slow transit constipation 09/24/2015  . Environmental and seasonal allergies 07/09/2015  . Anemia associated with chronic renal failure 07/09/2015  . Chronic kidney disease, stage IV (severe) ([place]) 07/09/2015  . Uncontrolled type 2 diabetes mellitus with diabetic peripheral angiopathy without gangrene ([place]) 07/09/2015  . Herpes simplex infection 07/09/2015  . Hypercholesterolemia 07/09/2015  . Heart & renal disease, hypertensive, with heart failure ([place]) 07/09/2015  . Arthritis of knee, degenerative 07/09/2015  . Tendinitis 07/09/2015  . Avitaminosis D 07/09/2015  . Body mass index (BMI) of 40.0-44.9 in adult ([place]) 06/07/2015  . Obstructive apnea  12/15/2014  . History of diabetic ulcer of foot 04/01/2014  . Intracervical pessary 01/07/2014  . Hypertension 09/11/2013  . Amputated toe ([place]) 01/31/2012    Prior to Admission medications   Medication Sig Start Date End Date Taking? Authorizing Provider  amLODipine (NORVASC) 10 MG tablet Take 1 tablet by mouth daily.   Yes [provider]  atorvastatin (LIPITOR) 10 MG tablet Take 1 tablet by mouth at bedtime. 09/21/15  Yes [provider]  calcium-vitamin D (OSCAL WITH D) 250-125 MG-UNIT tablet Take 1 tablet by mouth daily.   Yes [provider]  HUMULIN 70/30 KWIKPEN (70-30) 100 UNIT/ML PEN Dr Michiel Sites 08/04/15  Yes [provider]  hydrochlorothiazide (HYDRODIURIL) 50 MG tablet Take 2 tablets by mouth daily.    Yes [provider]  levonorgestrel (MIRENA, 52 MG,) 20 MCG/24HR IUD by Intrauterine route.   Yes [provider]  linaclotide (LINZESS) 290 MCG CAPS capsule Take 1 capsule (290 mcg total) by mouth daily. 01/12/16  Yes Glean Hess, MD  lisinopril (PRINIVIL,ZESTRIL) 40 MG tablet Take 40 mg by mouth 2 (two) times daily. 09/07/15  Yes [provider]  metoprolol succinate (TOPROL-XL) 100 MG 24 hr tablet Take 1.5 tablets by mouth daily. 06/04/15  Yes [provider]  neomycin-polymyxin b-dexamethasone (MAXITROL) 3.5-10000-0.1 SUSP Place 2 drops into the right eye every 6 (six) hours. 01/12/16  Yes Glean Hess, MD  Vitamin D, Ergocalciferol, (DRISDOL) 50000 UNITS CAPS capsule Take 1 capsule by mouth once a week.   Yes [provider]    Allergies  Allergen Reactions  . Atorvastatin     Other reaction(s): Other (See Comments) Back pain  . Peanut-Containing Drug Products Itching  . Pioglitazone  Other reaction(s): Edema  . Rosuvastatin Itching  . Shellfish-Derived Products Rash    shrimp  . Sulfa Antibiotics Rash    Past Surgical History:  Procedure Laterality Date  . avg for dialysis      . CHOLECYSTECTOMY    . COLONOSCOPY  08/15/2011   cleared for 10 yrs  . TOE AMPUTATION      Social History   Tobacco Use  . Smoking status: Never Smoker  . Smokeless tobacco: Never Used  Substance Use Topics  . Alcohol use: Yes    Alcohol/week: 0.0 oz  . Drug use: No     Medication list has been reviewed and updated.  PHQ 2/9 Scores 09/26/2017 01/12/2016 09/24/2015  PHQ - 2 Score 0 0 0  PHQ- 9 Score 0 - -    Physical Exam  Constitutional: She is oriented to person, place, and time. She appears well-developed and well-nourished.  HENT:  Right Ear: External ear and ear canal normal. Tympanic membrane is not erythematous and not retracted.  Left Ear: External ear and ear canal normal. Tympanic membrane is not erythematous and not retracted.  Nose: Right sinus exhibits maxillary sinus tenderness and frontal sinus tenderness. Left sinus exhibits maxillary sinus tenderness and frontal sinus tenderness.  Mouth/Throat: Uvula is midline and mucous membranes are normal. No oral lesions. Posterior oropharyngeal erythema present. No oropharyngeal exudate.  Eyes:  Clear tears without erythema or matting  Cardiovascular: Normal rate, regular rhythm and normal heart sounds.  Pulmonary/Chest: Breath sounds normal. She has no wheezes. She has no rales.  Lymphadenopathy:    She has no cervical adenopathy.  Neurological: She is alert and oriented to person, place, and time.    BP 112/68   Pulse 76   Temp 98 F (36.7 C) (Oral)   Ht 5\' 9"  (1.753 m)   Wt 282 lb (127.9 kg)   SpO2 100%   BMI 41.64 kg/m   Assessment and Plan: 1. Acute non-recurrent maxillary sinusitis - amoxicillin-clavulanate (AUGMENTIN) 875-125 MG tablet; Take 1 tablet by mouth 2 (two) times daily for 10 days.  Dispense: 20 tablet; Refill: 0 - chlorpheniramine-HYDROcodone (TUSSIONEX PENNKINETIC ER) 10-8 MG/5ML SUER; Take 5 mLs by mouth 2 (two) times daily.  Dispense: 230 mL; Refill: 0   Meds ordered this encounter   Medications  . amoxicillin-clavulanate (AUGMENTIN) 875-125 MG tablet    Sig: Take 1 tablet by mouth 2 (two) times daily for 10 days.    Dispense:  20 tablet    Refill:  0  . chlorpheniramine-HYDROcodone (TUSSIONEX PENNKINETIC ER) 10-8 MG/5ML SUER    Sig: Take 5 mLs by mouth 2 (two) times daily.    Dispense:  230 mL    Refill:  0    Partially dictated using Editor, commissioning. Any errors are unintentional.  Halina Maidens, MD [facility]  09/26/2017

## 2017-10-05 DIAGNOSIS — N185 Chronic kidney disease, stage 5: Secondary | ICD-10-CM | POA: Diagnosis not present

## 2017-10-11 DIAGNOSIS — N185 Chronic kidney disease, stage 5: Secondary | ICD-10-CM | POA: Diagnosis not present

## 2017-10-11 DIAGNOSIS — I129 Hypertensive chronic kidney disease with stage 1 through stage 4 chronic kidney disease, or unspecified chronic kidney disease: Secondary | ICD-10-CM | POA: Diagnosis not present

## 2017-10-11 DIAGNOSIS — E1140 Type 2 diabetes mellitus with diabetic neuropathy, unspecified: Secondary | ICD-10-CM | POA: Diagnosis not present

## 2017-10-11 DIAGNOSIS — D631 Anemia in chronic kidney disease: Secondary | ICD-10-CM | POA: Diagnosis not present

## 2017-10-29 DIAGNOSIS — E538 Deficiency of other specified B group vitamins: Secondary | ICD-10-CM | POA: Diagnosis not present

## 2017-10-29 DIAGNOSIS — Z6841 Body Mass Index (BMI) 40.0 and over, adult: Secondary | ICD-10-CM | POA: Diagnosis not present

## 2017-10-29 DIAGNOSIS — E782 Mixed hyperlipidemia: Secondary | ICD-10-CM | POA: Diagnosis not present

## 2017-10-29 DIAGNOSIS — N184 Chronic kidney disease, stage 4 (severe): Secondary | ICD-10-CM | POA: Diagnosis not present

## 2017-10-29 DIAGNOSIS — G4733 Obstructive sleep apnea (adult) (pediatric): Secondary | ICD-10-CM | POA: Diagnosis not present

## 2017-10-29 DIAGNOSIS — I10 Essential (primary) hypertension: Secondary | ICD-10-CM | POA: Diagnosis not present

## 2017-10-29 DIAGNOSIS — E1029 Type 1 diabetes mellitus with other diabetic kidney complication: Secondary | ICD-10-CM | POA: Diagnosis not present

## 2017-10-29 DIAGNOSIS — R0989 Other specified symptoms and signs involving the circulatory and respiratory systems: Secondary | ICD-10-CM | POA: Diagnosis not present

## 2017-10-29 DIAGNOSIS — E1065 Type 1 diabetes mellitus with hyperglycemia: Secondary | ICD-10-CM | POA: Diagnosis not present

## 2017-10-29 LAB — HEMOGLOBIN A1C: Hemoglobin A1C: 11.7

## 2017-11-08 DIAGNOSIS — N185 Chronic kidney disease, stage 5: Secondary | ICD-10-CM | POA: Diagnosis not present

## 2017-12-21 DIAGNOSIS — N185 Chronic kidney disease, stage 5: Secondary | ICD-10-CM | POA: Diagnosis not present

## 2017-12-24 DIAGNOSIS — M4306 Spondylolysis, lumbar region: Secondary | ICD-10-CM | POA: Diagnosis not present

## 2017-12-24 DIAGNOSIS — I129 Hypertensive chronic kidney disease with stage 1 through stage 4 chronic kidney disease, or unspecified chronic kidney disease: Secondary | ICD-10-CM | POA: Diagnosis not present

## 2017-12-24 DIAGNOSIS — I770 Arteriovenous fistula, acquired: Secondary | ICD-10-CM | POA: Diagnosis not present

## 2017-12-24 DIAGNOSIS — N185 Chronic kidney disease, stage 5: Secondary | ICD-10-CM | POA: Diagnosis not present

## 2017-12-24 DIAGNOSIS — E1140 Type 2 diabetes mellitus with diabetic neuropathy, unspecified: Secondary | ICD-10-CM | POA: Diagnosis not present

## 2017-12-24 DIAGNOSIS — M5416 Radiculopathy, lumbar region: Secondary | ICD-10-CM | POA: Diagnosis not present

## 2017-12-24 DIAGNOSIS — M461 Sacroiliitis, not elsewhere classified: Secondary | ICD-10-CM | POA: Diagnosis not present

## 2018-01-22 DIAGNOSIS — N185 Chronic kidney disease, stage 5: Secondary | ICD-10-CM | POA: Diagnosis not present

## 2018-01-23 DIAGNOSIS — M5416 Radiculopathy, lumbar region: Secondary | ICD-10-CM | POA: Diagnosis not present

## 2018-01-23 DIAGNOSIS — Z79899 Other long term (current) drug therapy: Secondary | ICD-10-CM | POA: Diagnosis not present

## 2018-01-25 DIAGNOSIS — E7800 Pure hypercholesterolemia, unspecified: Secondary | ICD-10-CM | POA: Diagnosis not present

## 2018-01-25 DIAGNOSIS — N185 Chronic kidney disease, stage 5: Secondary | ICD-10-CM | POA: Diagnosis not present

## 2018-01-25 DIAGNOSIS — I129 Hypertensive chronic kidney disease with stage 1 through stage 4 chronic kidney disease, or unspecified chronic kidney disease: Secondary | ICD-10-CM | POA: Diagnosis not present

## 2018-01-25 DIAGNOSIS — D631 Anemia in chronic kidney disease: Secondary | ICD-10-CM | POA: Diagnosis not present

## 2018-01-31 DIAGNOSIS — K080 Exfoliation of teeth due to systemic causes: Secondary | ICD-10-CM | POA: Diagnosis not present

## 2018-02-04 DIAGNOSIS — B9562 Methicillin resistant Staphylococcus aureus infection as the cause of diseases classified elsewhere: Secondary | ICD-10-CM | POA: Diagnosis not present

## 2018-02-04 DIAGNOSIS — L738 Other specified follicular disorders: Secondary | ICD-10-CM | POA: Diagnosis not present

## 2018-02-04 DIAGNOSIS — L0231 Cutaneous abscess of buttock: Secondary | ICD-10-CM | POA: Diagnosis not present

## 2018-02-19 LAB — HM DIABETES EYE EXAM

## 2018-02-20 ENCOUNTER — Encounter: Payer: Self-pay | Admitting: Internal Medicine

## 2018-04-01 DIAGNOSIS — M25559 Pain in unspecified hip: Secondary | ICD-10-CM | POA: Diagnosis not present

## 2018-04-01 DIAGNOSIS — N185 Chronic kidney disease, stage 5: Secondary | ICD-10-CM | POA: Diagnosis not present

## 2018-04-09 DIAGNOSIS — E11319 Type 2 diabetes mellitus with unspecified diabetic retinopathy without macular edema: Secondary | ICD-10-CM | POA: Diagnosis not present

## 2018-04-09 DIAGNOSIS — E1121 Type 2 diabetes mellitus with diabetic nephropathy: Secondary | ICD-10-CM | POA: Diagnosis not present

## 2018-04-09 DIAGNOSIS — N185 Chronic kidney disease, stage 5: Secondary | ICD-10-CM | POA: Diagnosis not present

## 2018-04-09 DIAGNOSIS — I10 Essential (primary) hypertension: Secondary | ICD-10-CM | POA: Diagnosis not present

## 2018-04-10 DIAGNOSIS — M25559 Pain in unspecified hip: Secondary | ICD-10-CM | POA: Diagnosis not present

## 2018-05-20 DIAGNOSIS — N185 Chronic kidney disease, stage 5: Secondary | ICD-10-CM | POA: Diagnosis not present

## 2018-05-20 LAB — BASIC METABOLIC PANEL
BUN: 75 — AB (ref 4–21)
Creatinine: 6.3 — AB (ref 0.5–1.1)

## 2018-05-23 DIAGNOSIS — E1121 Type 2 diabetes mellitus with diabetic nephropathy: Secondary | ICD-10-CM | POA: Diagnosis not present

## 2018-05-23 DIAGNOSIS — E7800 Pure hypercholesterolemia, unspecified: Secondary | ICD-10-CM | POA: Diagnosis not present

## 2018-05-23 DIAGNOSIS — I10 Essential (primary) hypertension: Secondary | ICD-10-CM | POA: Diagnosis not present

## 2018-05-23 DIAGNOSIS — N185 Chronic kidney disease, stage 5: Secondary | ICD-10-CM | POA: Diagnosis not present

## 2018-05-24 ENCOUNTER — Other Ambulatory Visit: Payer: Self-pay | Admitting: Internal Medicine

## 2018-07-10 DIAGNOSIS — G894 Chronic pain syndrome: Secondary | ICD-10-CM | POA: Diagnosis not present

## 2018-08-01 DIAGNOSIS — G4733 Obstructive sleep apnea (adult) (pediatric): Secondary | ICD-10-CM | POA: Diagnosis not present

## 2018-08-09 DIAGNOSIS — N185 Chronic kidney disease, stage 5: Secondary | ICD-10-CM | POA: Diagnosis not present

## 2018-08-27 DIAGNOSIS — L810 Postinflammatory hyperpigmentation: Secondary | ICD-10-CM | POA: Diagnosis not present

## 2018-10-04 DIAGNOSIS — Z1231 Encounter for screening mammogram for malignant neoplasm of breast: Secondary | ICD-10-CM | POA: Diagnosis not present

## 2018-10-04 DIAGNOSIS — Z01411 Encounter for gynecological examination (general) (routine) with abnormal findings: Secondary | ICD-10-CM | POA: Diagnosis not present

## 2018-10-04 DIAGNOSIS — Z6841 Body Mass Index (BMI) 40.0 and over, adult: Secondary | ICD-10-CM | POA: Diagnosis not present

## 2018-10-04 DIAGNOSIS — Z124 Encounter for screening for malignant neoplasm of cervix: Secondary | ICD-10-CM | POA: Diagnosis not present

## 2018-10-04 LAB — HM PAP SMEAR: HM Pap smear: NORMAL

## 2018-10-11 ENCOUNTER — Telehealth: Payer: Self-pay

## 2018-10-11 ENCOUNTER — Other Ambulatory Visit: Payer: Self-pay | Admitting: Internal Medicine

## 2018-10-11 DIAGNOSIS — E119 Type 2 diabetes mellitus without complications: Secondary | ICD-10-CM | POA: Diagnosis not present

## 2018-10-11 DIAGNOSIS — D485 Neoplasm of uncertain behavior of skin: Secondary | ICD-10-CM | POA: Diagnosis not present

## 2018-10-11 DIAGNOSIS — L97514 Non-pressure chronic ulcer of other part of right foot with necrosis of bone: Secondary | ICD-10-CM | POA: Diagnosis not present

## 2018-10-11 DIAGNOSIS — M2021 Hallux rigidus, right foot: Secondary | ICD-10-CM | POA: Diagnosis not present

## 2018-10-11 DIAGNOSIS — B079 Viral wart, unspecified: Secondary | ICD-10-CM | POA: Diagnosis not present

## 2018-10-11 DIAGNOSIS — M86271 Subacute osteomyelitis, right ankle and foot: Secondary | ICD-10-CM | POA: Diagnosis not present

## 2018-10-11 NOTE — Telephone Encounter (Signed)
I spoke with Dr. Berdine Addison and advised that I am not comfortable giving surgery clearance in a patient that I do not see on a regular basis.  In addition, she has CKD stage 5, uncontrolled DM and HTN which I do not manage.  Dr. Berdine Addison was asking for clearance to do non-elective surgery for osteomyelitis of the foot as an outpatient.  After talking, she understood my hesitation and will discuss with patient doing the surgery as an inpatient due to multiple risk factors.  She or her staff with contact the patient and make arrangements.

## 2018-10-11 NOTE — Telephone Encounter (Signed)
Patient called several times today asking Dr [name] to clear her for surgery for her toe. Her surgery is in 1 week. Patient informed we have not seen her in a year and Dr [name] cannot do surgery clearance.  Dr [name] called and spoke with Dr Berdine Addison about patient surgery clearance.   Sending to Dr B for additional documentation.

## 2018-10-14 DIAGNOSIS — D649 Anemia, unspecified: Secondary | ICD-10-CM | POA: Diagnosis not present

## 2018-10-14 DIAGNOSIS — E872 Acidosis: Secondary | ICD-10-CM | POA: Diagnosis not present

## 2018-10-14 DIAGNOSIS — E1152 Type 2 diabetes mellitus with diabetic peripheral angiopathy with gangrene: Secondary | ICD-10-CM | POA: Diagnosis not present

## 2018-10-14 DIAGNOSIS — E7800 Pure hypercholesterolemia, unspecified: Secondary | ICD-10-CM | POA: Diagnosis not present

## 2018-10-14 DIAGNOSIS — N184 Chronic kidney disease, stage 4 (severe): Secondary | ICD-10-CM | POA: Diagnosis not present

## 2018-10-14 DIAGNOSIS — L97519 Non-pressure chronic ulcer of other part of right foot with unspecified severity: Secondary | ICD-10-CM | POA: Diagnosis not present

## 2018-10-14 DIAGNOSIS — E1169 Type 2 diabetes mellitus with other specified complication: Secondary | ICD-10-CM | POA: Diagnosis not present

## 2018-10-14 DIAGNOSIS — M86271 Subacute osteomyelitis, right ankle and foot: Secondary | ICD-10-CM | POA: Diagnosis not present

## 2018-10-14 DIAGNOSIS — E11621 Type 2 diabetes mellitus with foot ulcer: Secondary | ICD-10-CM | POA: Diagnosis not present

## 2018-10-14 DIAGNOSIS — Z6841 Body Mass Index (BMI) 40.0 and over, adult: Secondary | ICD-10-CM | POA: Diagnosis not present

## 2018-10-14 DIAGNOSIS — E119 Type 2 diabetes mellitus without complications: Secondary | ICD-10-CM | POA: Diagnosis not present

## 2018-10-14 DIAGNOSIS — I10 Essential (primary) hypertension: Secondary | ICD-10-CM | POA: Diagnosis not present

## 2018-10-14 DIAGNOSIS — L97514 Non-pressure chronic ulcer of other part of right foot with necrosis of bone: Secondary | ICD-10-CM | POA: Diagnosis not present

## 2018-10-14 DIAGNOSIS — G4733 Obstructive sleep apnea (adult) (pediatric): Secondary | ICD-10-CM | POA: Diagnosis not present

## 2018-10-14 DIAGNOSIS — Z89421 Acquired absence of other right toe(s): Secondary | ICD-10-CM | POA: Diagnosis not present

## 2018-10-14 DIAGNOSIS — E1122 Type 2 diabetes mellitus with diabetic chronic kidney disease: Secondary | ICD-10-CM | POA: Diagnosis not present

## 2018-10-14 DIAGNOSIS — I120 Hypertensive chronic kidney disease with stage 5 chronic kidney disease or end stage renal disease: Secondary | ICD-10-CM | POA: Diagnosis not present

## 2018-10-14 DIAGNOSIS — E1165 Type 2 diabetes mellitus with hyperglycemia: Secondary | ICD-10-CM | POA: Diagnosis not present

## 2018-10-14 DIAGNOSIS — N185 Chronic kidney disease, stage 5: Secondary | ICD-10-CM | POA: Diagnosis not present

## 2018-10-14 DIAGNOSIS — E669 Obesity, unspecified: Secondary | ICD-10-CM | POA: Diagnosis not present

## 2018-10-14 DIAGNOSIS — M86171 Other acute osteomyelitis, right ankle and foot: Secondary | ICD-10-CM | POA: Diagnosis not present

## 2018-10-15 DIAGNOSIS — M868X8 Other osteomyelitis, other site: Secondary | ICD-10-CM | POA: Diagnosis not present

## 2018-10-15 DIAGNOSIS — M86671 Other chronic osteomyelitis, right ankle and foot: Secondary | ICD-10-CM | POA: Diagnosis not present

## 2018-10-15 DIAGNOSIS — E7800 Pure hypercholesterolemia, unspecified: Secondary | ICD-10-CM | POA: Diagnosis not present

## 2018-10-15 DIAGNOSIS — L97519 Non-pressure chronic ulcer of other part of right foot with unspecified severity: Secondary | ICD-10-CM | POA: Diagnosis not present

## 2018-10-15 DIAGNOSIS — M868X7 Other osteomyelitis, ankle and foot: Secondary | ICD-10-CM | POA: Diagnosis not present

## 2018-10-15 DIAGNOSIS — E1122 Type 2 diabetes mellitus with diabetic chronic kidney disease: Secondary | ICD-10-CM | POA: Diagnosis not present

## 2018-10-15 DIAGNOSIS — E11621 Type 2 diabetes mellitus with foot ulcer: Secondary | ICD-10-CM | POA: Diagnosis not present

## 2018-10-15 DIAGNOSIS — N184 Chronic kidney disease, stage 4 (severe): Secondary | ICD-10-CM | POA: Diagnosis not present

## 2018-10-15 DIAGNOSIS — N185 Chronic kidney disease, stage 5: Secondary | ICD-10-CM | POA: Diagnosis not present

## 2018-10-15 DIAGNOSIS — M869 Osteomyelitis, unspecified: Secondary | ICD-10-CM | POA: Diagnosis not present

## 2018-10-15 DIAGNOSIS — I10 Essential (primary) hypertension: Secondary | ICD-10-CM | POA: Diagnosis not present

## 2018-10-15 DIAGNOSIS — L97516 Non-pressure chronic ulcer of other part of right foot with bone involvement without evidence of necrosis: Secondary | ICD-10-CM | POA: Diagnosis not present

## 2018-10-15 HISTORY — PX: TOE AMPUTATION: SHX809

## 2018-10-16 DIAGNOSIS — E1065 Type 1 diabetes mellitus with hyperglycemia: Secondary | ICD-10-CM | POA: Diagnosis not present

## 2018-10-16 DIAGNOSIS — L97519 Non-pressure chronic ulcer of other part of right foot with unspecified severity: Secondary | ICD-10-CM | POA: Diagnosis not present

## 2018-10-16 DIAGNOSIS — N185 Chronic kidney disease, stage 5: Secondary | ICD-10-CM | POA: Diagnosis not present

## 2018-10-16 DIAGNOSIS — I10 Essential (primary) hypertension: Secondary | ICD-10-CM | POA: Diagnosis not present

## 2018-10-16 DIAGNOSIS — E11621 Type 2 diabetes mellitus with foot ulcer: Secondary | ICD-10-CM | POA: Diagnosis not present

## 2018-10-16 DIAGNOSIS — M86171 Other acute osteomyelitis, right ankle and foot: Secondary | ICD-10-CM | POA: Diagnosis not present

## 2018-10-16 DIAGNOSIS — I1311 Hypertensive heart and chronic kidney disease without heart failure, with stage 5 chronic kidney disease, or end stage renal disease: Secondary | ICD-10-CM | POA: Diagnosis not present

## 2018-10-16 DIAGNOSIS — I96 Gangrene, not elsewhere classified: Secondary | ICD-10-CM | POA: Diagnosis not present

## 2018-10-16 DIAGNOSIS — E1122 Type 2 diabetes mellitus with diabetic chronic kidney disease: Secondary | ICD-10-CM | POA: Diagnosis not present

## 2018-10-17 DIAGNOSIS — E1122 Type 2 diabetes mellitus with diabetic chronic kidney disease: Secondary | ICD-10-CM | POA: Diagnosis not present

## 2018-10-17 DIAGNOSIS — I10 Essential (primary) hypertension: Secondary | ICD-10-CM | POA: Diagnosis not present

## 2018-10-17 DIAGNOSIS — E1065 Type 1 diabetes mellitus with hyperglycemia: Secondary | ICD-10-CM | POA: Diagnosis not present

## 2018-10-17 DIAGNOSIS — E11621 Type 2 diabetes mellitus with foot ulcer: Secondary | ICD-10-CM | POA: Diagnosis not present

## 2018-10-17 DIAGNOSIS — I96 Gangrene, not elsewhere classified: Secondary | ICD-10-CM | POA: Diagnosis not present

## 2018-10-17 DIAGNOSIS — I1311 Hypertensive heart and chronic kidney disease without heart failure, with stage 5 chronic kidney disease, or end stage renal disease: Secondary | ICD-10-CM | POA: Diagnosis not present

## 2018-10-17 DIAGNOSIS — M86171 Other acute osteomyelitis, right ankle and foot: Secondary | ICD-10-CM | POA: Diagnosis not present

## 2018-10-17 DIAGNOSIS — N185 Chronic kidney disease, stage 5: Secondary | ICD-10-CM | POA: Diagnosis not present

## 2018-10-18 DIAGNOSIS — I1311 Hypertensive heart and chronic kidney disease without heart failure, with stage 5 chronic kidney disease, or end stage renal disease: Secondary | ICD-10-CM | POA: Diagnosis not present

## 2018-10-18 DIAGNOSIS — E1122 Type 2 diabetes mellitus with diabetic chronic kidney disease: Secondary | ICD-10-CM | POA: Diagnosis not present

## 2018-10-18 DIAGNOSIS — D631 Anemia in chronic kidney disease: Secondary | ICD-10-CM | POA: Diagnosis not present

## 2018-10-18 DIAGNOSIS — I10 Essential (primary) hypertension: Secondary | ICD-10-CM | POA: Diagnosis not present

## 2018-10-18 DIAGNOSIS — N185 Chronic kidney disease, stage 5: Secondary | ICD-10-CM | POA: Diagnosis not present

## 2018-10-18 DIAGNOSIS — E1065 Type 1 diabetes mellitus with hyperglycemia: Secondary | ICD-10-CM | POA: Diagnosis not present

## 2018-10-18 DIAGNOSIS — E11621 Type 2 diabetes mellitus with foot ulcer: Secondary | ICD-10-CM | POA: Diagnosis not present

## 2018-10-18 DIAGNOSIS — M86171 Other acute osteomyelitis, right ankle and foot: Secondary | ICD-10-CM | POA: Diagnosis not present

## 2018-10-19 DIAGNOSIS — M86171 Other acute osteomyelitis, right ankle and foot: Secondary | ICD-10-CM | POA: Diagnosis not present

## 2018-10-19 DIAGNOSIS — L97516 Non-pressure chronic ulcer of other part of right foot with bone involvement without evidence of necrosis: Secondary | ICD-10-CM | POA: Diagnosis not present

## 2018-10-19 DIAGNOSIS — E11621 Type 2 diabetes mellitus with foot ulcer: Secondary | ICD-10-CM | POA: Diagnosis not present

## 2018-10-21 ENCOUNTER — Encounter: Payer: Self-pay | Admitting: Internal Medicine

## 2018-10-21 ENCOUNTER — Other Ambulatory Visit: Payer: Self-pay | Admitting: Internal Medicine

## 2018-10-23 DIAGNOSIS — M86271 Subacute osteomyelitis, right ankle and foot: Secondary | ICD-10-CM | POA: Diagnosis not present

## 2018-10-25 ENCOUNTER — Inpatient Hospital Stay: Payer: Federal, State, Local not specified - PPO | Admitting: Internal Medicine

## 2018-10-30 DIAGNOSIS — N185 Chronic kidney disease, stage 5: Secondary | ICD-10-CM | POA: Diagnosis not present

## 2018-10-30 DIAGNOSIS — E119 Type 2 diabetes mellitus without complications: Secondary | ICD-10-CM | POA: Diagnosis not present

## 2018-10-30 DIAGNOSIS — L603 Nail dystrophy: Secondary | ICD-10-CM | POA: Diagnosis not present

## 2018-12-04 DIAGNOSIS — G894 Chronic pain syndrome: Secondary | ICD-10-CM | POA: Diagnosis not present

## 2018-12-10 DIAGNOSIS — E538 Deficiency of other specified B group vitamins: Secondary | ICD-10-CM | POA: Diagnosis not present

## 2018-12-10 DIAGNOSIS — E782 Mixed hyperlipidemia: Secondary | ICD-10-CM | POA: Diagnosis not present

## 2018-12-10 DIAGNOSIS — E1029 Type 1 diabetes mellitus with other diabetic kidney complication: Secondary | ICD-10-CM | POA: Diagnosis not present

## 2018-12-10 DIAGNOSIS — I10 Essential (primary) hypertension: Secondary | ICD-10-CM | POA: Diagnosis not present

## 2018-12-10 DIAGNOSIS — N185 Chronic kidney disease, stage 5: Secondary | ICD-10-CM | POA: Diagnosis not present

## 2018-12-10 DIAGNOSIS — E1065 Type 1 diabetes mellitus with hyperglycemia: Secondary | ICD-10-CM | POA: Diagnosis not present

## 2018-12-12 DIAGNOSIS — E1121 Type 2 diabetes mellitus with diabetic nephropathy: Secondary | ICD-10-CM | POA: Diagnosis not present

## 2018-12-12 DIAGNOSIS — Z794 Long term (current) use of insulin: Secondary | ICD-10-CM | POA: Diagnosis not present

## 2018-12-19 DIAGNOSIS — N185 Chronic kidney disease, stage 5: Secondary | ICD-10-CM | POA: Diagnosis not present

## 2018-12-19 DIAGNOSIS — Z89429 Acquired absence of other toe(s), unspecified side: Secondary | ICD-10-CM | POA: Diagnosis not present

## 2018-12-19 DIAGNOSIS — Z794 Long term (current) use of insulin: Secondary | ICD-10-CM | POA: Diagnosis not present

## 2018-12-19 DIAGNOSIS — I10 Essential (primary) hypertension: Secondary | ICD-10-CM | POA: Diagnosis not present

## 2018-12-27 ENCOUNTER — Telehealth: Payer: Self-pay

## 2018-12-27 NOTE — Telephone Encounter (Signed)
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 They have has several cases of COVID in the prison. Her work has sent out an email stating that if you have underlying medical conditions and you are concerned about it then to speak with HR.  Patient has not contacted her HR. She just received the e-mail and said she wanted to call her doctor to see what she recommended.  I did tell her that even tho she may have some underlying health conditions, we cannot give her a note to put her out of work. I told her if she is required to work then she will have to discuss this with her job.  She did ask for my name and stated that "I know who you are, you are the one that didn't get me want I needed for another Doctor. And my other Doctor had to call Dr. [name] and get it from her since you wouldn't." I informed patient I am unsure of what she is referring to but I will talk to Dr. [name] and call her back. Patient seemed upset, but said okay.  Please Advise. She said she wants to know what her doctor recommend because "this is getting crazy."

## 2018-12-27 NOTE — Telephone Encounter (Signed)
Please advise her that her that I do not give anyone letters to excuse them from work due to [place] and their personal health circumstances.  Any arrangement such as work from home or reduced hours is between the employee and the employer.  If she was advised to contact HR, then that is what I would recommend.

## 2018-12-27 NOTE — Telephone Encounter (Signed)
Patient informed of Dr. Gaspar Cola exact note. The only response she gave me was "mhm." I just told her to have a good weekend. And she said "byebye."

## 2019-02-07 DIAGNOSIS — G894 Chronic pain syndrome: Secondary | ICD-10-CM | POA: Diagnosis not present

## 2019-02-17 DIAGNOSIS — N185 Chronic kidney disease, stage 5: Secondary | ICD-10-CM | POA: Diagnosis not present

## 2019-02-17 LAB — CBC AND DIFFERENTIAL
HCT: 29 — AB (ref 36–46)
Hemoglobin: 9.6 — AB (ref 12.0–16.0)
Platelets: 227 (ref 150–399)
WBC: 8.3

## 2019-02-17 LAB — BASIC METABOLIC PANEL
BUN: 76 — AB (ref 4–21)
Creatinine: 7.1 — AB (ref 0.5–1.1)
Glucose: 161
Potassium: 4.9 (ref 3.4–5.3)
Sodium: 136 — AB (ref 137–147)

## 2019-02-17 LAB — HEMOGLOBIN A1C: Hemoglobin A1C: 7.6

## 2019-02-24 ENCOUNTER — Encounter: Payer: Self-pay | Admitting: Internal Medicine

## 2019-02-24 ENCOUNTER — Ambulatory Visit (INDEPENDENT_AMBULATORY_CARE_PROVIDER_SITE_OTHER): Payer: Federal, State, Local not specified - PPO | Admitting: Internal Medicine

## 2019-02-24 ENCOUNTER — Other Ambulatory Visit: Payer: Self-pay

## 2019-02-24 VITALS — BP 116/74 | HR 73 | Ht 69.0 in | Wt 271.0 lb

## 2019-02-24 DIAGNOSIS — E785 Hyperlipidemia, unspecified: Secondary | ICD-10-CM

## 2019-02-24 DIAGNOSIS — R935 Abnormal findings on diagnostic imaging of other abdominal regions, including retroperitoneum: Secondary | ICD-10-CM | POA: Diagnosis not present

## 2019-02-24 DIAGNOSIS — I10 Essential (primary) hypertension: Secondary | ICD-10-CM | POA: Diagnosis not present

## 2019-02-24 DIAGNOSIS — E7800 Pure hypercholesterolemia, unspecified: Secondary | ICD-10-CM | POA: Diagnosis not present

## 2019-02-24 DIAGNOSIS — E1151 Type 2 diabetes mellitus with diabetic peripheral angiopathy without gangrene: Secondary | ICD-10-CM | POA: Diagnosis not present

## 2019-02-24 DIAGNOSIS — S98131A Complete traumatic amputation of one right lesser toe, initial encounter: Secondary | ICD-10-CM

## 2019-02-24 DIAGNOSIS — IMO0002 Reserved for concepts with insufficient information to code with codable children: Secondary | ICD-10-CM

## 2019-02-24 DIAGNOSIS — Z1211 Encounter for screening for malignant neoplasm of colon: Secondary | ICD-10-CM

## 2019-02-24 DIAGNOSIS — E1169 Type 2 diabetes mellitus with other specified complication: Secondary | ICD-10-CM

## 2019-02-24 DIAGNOSIS — Z23 Encounter for immunization: Secondary | ICD-10-CM | POA: Diagnosis not present

## 2019-02-24 DIAGNOSIS — N185 Chronic kidney disease, stage 5: Secondary | ICD-10-CM

## 2019-02-24 DIAGNOSIS — E1121 Type 2 diabetes mellitus with diabetic nephropathy: Secondary | ICD-10-CM | POA: Diagnosis not present

## 2019-02-24 DIAGNOSIS — E1165 Type 2 diabetes mellitus with hyperglycemia: Secondary | ICD-10-CM | POA: Diagnosis not present

## 2019-02-24 DIAGNOSIS — E1122 Type 2 diabetes mellitus with diabetic chronic kidney disease: Secondary | ICD-10-CM

## 2019-02-24 DIAGNOSIS — Z0000 Encounter for general adult medical examination without abnormal findings: Secondary | ICD-10-CM

## 2019-02-24 NOTE — Progress Notes (Signed)
Date:  02/24/2019   Name:  Cindy Osborne   DOB:  07/29/1966   MRN:  [id number]   Chief Complaint: Annual Exam (Has OBGYN and Endo. ) Cindy Osborne is a 53 y.o. female who presents today for her Complete Annual Exam. She feels fairly well. She reports exercising walking.. She reports she is sleeping well. She continues to work but is contemplating retirement this fall.  Colonoscopy - done at [facility] per patient but no record found Mammogram - overdue; pt wants GYN to order Pap smear - 10/04/2018 Pneumonia vaccine due  Abnormal MRI - had MRI of the hip showing labral tear and tendinosis.  Also mention of left pelvic cystic structures. Hypertension This is a chronic problem. The problem is controlled. Pertinent negatives include no chest pain, headaches, palpitations or shortness of breath. Past treatments include beta blockers, calcium channel blockers, diuretics and ACE inhibitors. The current treatment provides significant improvement. Hypertensive end-organ damage includes PVD.  Diabetes She presents for her follow-up diabetic visit. She has type 2 diabetes mellitus. Pertinent negatives for hypoglycemia include no dizziness, headaches, nervousness/anxiousness or tremors. Pertinent negatives for diabetes include no chest pain, no fatigue, no polydipsia and no polyuria. Diabetic complications include nephropathy, peripheral neuropathy and PVD. Current diabetic treatment includes intensive insulin program. She is compliant with treatment most of the time. Her weight is stable. She is following a generally healthy diet. She participates in exercise three times a week. She monitors blood glucose at home 1-2 x per day. Her breakfast blood glucose is taken between 6-7 am. Her breakfast blood glucose range is generally 140-180 mg/dl. An ACE inhibitor/angiotensin II receptor blocker is being taken.  Hyperlipidemia This is a chronic problem. The problem is controlled. Pertinent negatives include no  chest pain or shortness of breath. Current antihyperlipidemic treatment includes ezetimibe. The current treatment provides mild improvement of lipids.  CKD stage 5 - followed closely by Nephrology.  Cr had been stable around 5.5 but now up to 7.  She has mild edema at times.   Lab Results  Component Value Date   CREATININE 7.1 (A) 02/17/2019   BUN 76 (A) 02/17/2019   NA 136 (A) 02/17/2019   K 4.9 02/17/2019   Lab Results  Component Value Date   HGBA1C 7.6 02/17/2019   Lab Results  Component Value Date   WBC 8.3 02/17/2019   HGB 9.6 (A) 02/17/2019   HCT 29 (A) 02/17/2019   PLT 227 02/17/2019    Review of Systems  Constitutional: Negative for chills, fatigue and fever.  HENT: Negative for congestion, hearing loss, tinnitus, trouble swallowing and voice change.   Eyes: Negative for visual disturbance.  Respiratory: Negative for cough, chest tightness, shortness of breath and wheezing.   Cardiovascular: Positive for leg swelling. Negative for chest pain and palpitations.  Gastrointestinal: Negative for abdominal pain, blood in stool, constipation, diarrhea and vomiting.  Endocrine: Negative for polydipsia and polyuria.  Genitourinary: Negative for dysuria, frequency, genital sores, vaginal bleeding and vaginal discharge.  Musculoskeletal: Positive for arthralgias. Negative for gait problem and joint swelling.  Skin: Negative for color change and rash.  Neurological: Positive for numbness. Negative for dizziness, tremors, light-headedness and headaches.  Hematological: Negative for adenopathy. Does not bruise/bleed easily.  Psychiatric/Behavioral: Negative for dysphoric mood and sleep disturbance. The patient is not nervous/anxious.     Patient Active Problem List   Diagnosis Date Noted  . Diabetic retinopathy ([place]) 11/23/2016  . Uncontrolled diabetes mellitus with stage 5 chronic kidney disease (  [place]) 09/24/2015  . Slow transit constipation 09/24/2015  . Environmental and  seasonal allergies 07/09/2015  . Anemia associated with chronic renal failure 07/09/2015  . Chronic kidney disease, stage IV (severe) ([place]) 07/09/2015  . Uncontrolled type 2 diabetes mellitus with diabetic peripheral angiopathy without gangrene ([place]) 07/09/2015  . Herpes simplex infection 07/09/2015  . Hyperlipidemia associated with type 2 diabetes mellitus ([place]) 07/09/2015  . Heart & renal disease, hypertensive, with heart failure ([place]) 07/09/2015  . Arthritis of knee, degenerative 07/09/2015  . Tendinitis 07/09/2015  . Avitaminosis D 07/09/2015  . Body mass index (BMI) of 40.0-44.9 in adult ([place]) 06/07/2015  . Obstructive apnea 12/15/2014  . History of diabetic ulcer of foot 04/01/2014  . Intracervical pessary 01/07/2014  . Hypertension 09/11/2013  . Amputated toe ([place]) 01/31/2012    Allergies  Allergen Reactions  . Atorvastatin     Other reaction(s): Other (See Comments) Back pain  . Peanut-Containing Drug Products Itching  . Pioglitazone     Other reaction(s): Edema  . Rosuvastatin Itching  . Shellfish-Derived Products Rash    shrimp  . Sulfa Antibiotics Rash    Past Surgical History:  Procedure Laterality Date  . avg for dialysis    . CHOLECYSTECTOMY    . COLONOSCOPY  08/15/2011   cleared for 10 yrs  . TOE AMPUTATION    . TOE AMPUTATION Right 10/15/2018    Social History   Tobacco Use  . Smoking status: Never Smoker  . Smokeless tobacco: Never Used  Substance Use Topics  . Alcohol use: Yes    Alcohol/week: 0.0 standard drinks  . Drug use: No     Medication list has been reviewed and updated.  Current Meds  Medication Sig  . amLODipine (NORVASC) 10 MG tablet Take 1 tablet by mouth daily.  . benazepril (LOTENSIN) 40 MG tablet   . ezetimibe (ZETIA) 10 MG tablet Take 10 mg by mouth daily.   . hydrochlorothiazide (HYDRODIURIL) 50 MG tablet Take 2 tablets by mouth daily.   . insulin aspart (NOVOLOG FLEXPEN) 100 UNIT/ML FlexPen Inject 5 Units into the skin  3 (three) times daily before meals.  . Insulin Glargine (LANTUS SOLOSTAR) 100 UNIT/ML Solostar Pen Inject 22 Units into the skin daily.  Marland Kitchen levonorgestrel (MIRENA, 52 MG,) 20 MCG/24HR IUD by Intrauterine route.  . metoprolol succinate (TOPROL-XL) 100 MG 24 hr tablet Take 1.5 tablets by mouth daily.  . Naloxone HCl 0.4 MG/0.4ML SOAJ Inject into the muscle.  . senna-docusate (SENOKOT-S) 8.6-50 MG tablet Take by mouth.  . sodium bicarbonate 650 MG tablet Take by mouth 3 (three) times daily.  . traMADol (ULTRAM) 50 MG tablet Take 50 mg by mouth every 6 (six) hours as needed. Fall- Cysts in hip bone- PRN for pain    PHQ 2/9 Scores 02/24/2019 09/26/2017 01/12/2016 09/24/2015  PHQ - 2 Score 0 0 0 0  PHQ- 9 Score 0 0 - -    BP Readings from Last 3 Encounters:  02/24/19 116/74  09/26/17 112/68  06/20/16 (!) 142/80    Physical Exam Vitals signs and nursing note reviewed.  Constitutional:      General: She is not in acute distress.    Appearance: She is well-developed.  HENT:     Head: Normocephalic and atraumatic.     Right Ear: Tympanic membrane and ear canal normal.     Left Ear: Tympanic membrane and ear canal normal.     Nose:     Right Sinus: No maxillary sinus tenderness.  Left Sinus: No maxillary sinus tenderness.  Eyes:     General: No scleral icterus.       Right eye: No discharge.        Left eye: No discharge.     Conjunctiva/sclera: Conjunctivae normal.  Neck:     Musculoskeletal: Normal range of motion. No erythema.     Thyroid: No thyromegaly.     Vascular: No carotid bruit.  Cardiovascular:     Rate and Rhythm: Normal rate and regular rhythm.     Pulses:          Dorsalis pedis pulses are 1+ on the right side and 1+ on the left side.       Posterior tibial pulses are 1+ on the right side and 1+ on the left side.     Heart sounds: Normal heart sounds. No murmur.  Pulmonary:     Effort: Pulmonary effort is normal. No respiratory distress.     Breath sounds: No  wheezing.  Abdominal:     General: Abdomen is protuberant. Bowel sounds are normal.     Palpations: Abdomen is soft.     Tenderness: There is no abdominal tenderness. There is no guarding or rebound.  Musculoskeletal:     Right lower leg: 1+ Pitting Edema present.     Left lower leg: 1+ Pitting Edema present.  Feet:     Right foot:     Skin integrity: Erythema present.     Left foot:     Skin integrity: Callus and dry skin present.     Comments: Right great toe surgically absent Distal right second toe surgically absent Lymphadenopathy:     Cervical: No cervical adenopathy.  Skin:    General: Skin is warm and dry.     Findings: No rash.  Neurological:     Mental Status: She is alert and oriented to person, place, and time.     Cranial Nerves: No cranial nerve deficit.     Sensory: No sensory deficit.     Motor: Motor function is intact.     Deep Tendon Reflexes: Reflexes are normal and symmetric.  Psychiatric:        Attention and Perception: Attention normal.        Mood and Affect: Mood normal.        Speech: Speech normal.        Behavior: Behavior normal.        Thought Content: Thought content normal.     Wt Readings from Last 3 Encounters:  02/24/19 271 lb (122.9 kg)  09/26/17 282 lb (127.9 kg)  06/20/16 298 lb (135.2 kg)    BP 116/74   Pulse 73   Ht 5\' 9"  (1.753 m)   Wt 271 lb (122.9 kg)   SpO2 99%   BMI 40.02 kg/m   Assessment and Plan: 1. Annual physical exam Pt sees GYN - due for Mammogram Needs to work on diet and weight loss  2. Colon cancer screening Likely due for colonoscopy but pt maintains that it has been done recently  3. Essential hypertension controlled  4. Uncontrolled type 2 diabetes mellitus with diabetic peripheral angiopathy without gangrene ([place]) Followed by Endocrinology - TSH + free T4  5. Hyperlipidemia associated with type 2 diabetes mellitus ([place]) continue zetia Intolerant of statins - Hepatic function panel - Lipid  panel  6. Abnormal MRI, pelvis Recommend GYN follow up and Mammogram  7. Amputation of toe of right foot ([place])  8. CKD stage 5  due to type 2 diabetes mellitus ([place]) Followed by Nephrology  9. Need for vaccination for pneumococcus Will schedule when she has a long weekend off of work   Psychologist, occupational. Any errors are unintentional.  Halina Maidens, MD [facility]  02/24/2019

## 2019-02-25 LAB — HEPATIC FUNCTION PANEL
ALT: 9 IU/L (ref 0–32)
AST: 9 IU/L (ref 0–40)
Albumin: 4 g/dL (ref 3.8–4.9)
Alkaline Phosphatase: 80 IU/L (ref 39–117)
Bilirubin Total: 0.3 mg/dL (ref 0.0–1.2)
Bilirubin, Direct: 0.07 mg/dL (ref 0.00–0.40)
Total Protein: 6.8 g/dL (ref 6.0–8.5)

## 2019-02-25 LAB — LIPID PANEL
Chol/HDL Ratio: 4.7 ratio — ABNORMAL HIGH (ref 0.0–4.4)
Cholesterol, Total: 155 mg/dL (ref 100–199)
HDL: 33 mg/dL — ABNORMAL LOW (ref >39)
LDL Calculated: 94 mg/dL (ref 0–99)
Triglycerides: 139 mg/dL (ref 0–149)
VLDL Cholesterol Cal: 28 mg/dL (ref 5–40)

## 2019-02-25 LAB — TSH+FREE T4
Free T4: 1.19 ng/dL (ref 0.82–1.77)
TSH: 1.6 u[IU]/mL (ref 0.450–4.500)

## 2019-02-26 ENCOUNTER — Other Ambulatory Visit: Payer: Self-pay

## 2019-02-26 ENCOUNTER — Ambulatory Visit (INDEPENDENT_AMBULATORY_CARE_PROVIDER_SITE_OTHER): Payer: Federal, State, Local not specified - PPO

## 2019-02-26 DIAGNOSIS — Z23 Encounter for immunization: Secondary | ICD-10-CM | POA: Diagnosis not present

## 2019-03-07 ENCOUNTER — Other Ambulatory Visit: Payer: Self-pay

## 2019-03-07 ENCOUNTER — Ambulatory Visit: Payer: Federal, State, Local not specified - PPO

## 2019-03-07 ENCOUNTER — Other Ambulatory Visit: Payer: Self-pay | Admitting: Internal Medicine

## 2019-03-07 DIAGNOSIS — Z20818 Contact with and (suspected) exposure to other bacterial communicable diseases: Secondary | ICD-10-CM

## 2019-03-07 NOTE — Addendum Note (Signed)
Addended by: Clista Bernhardt on: 03/07/2019 03:02 PM   Modules accepted: Orders

## 2019-03-14 DIAGNOSIS — N185 Chronic kidney disease, stage 5: Secondary | ICD-10-CM | POA: Diagnosis not present

## 2019-03-14 DIAGNOSIS — Z794 Long term (current) use of insulin: Secondary | ICD-10-CM | POA: Diagnosis not present

## 2019-03-14 DIAGNOSIS — E1121 Type 2 diabetes mellitus with diabetic nephropathy: Secondary | ICD-10-CM | POA: Diagnosis not present

## 2019-03-14 LAB — CULTURE, GROUP A STREP: Strep A Culture: NEGATIVE

## 2019-03-17 DIAGNOSIS — E1121 Type 2 diabetes mellitus with diabetic nephropathy: Secondary | ICD-10-CM | POA: Diagnosis not present

## 2019-03-17 DIAGNOSIS — Z794 Long term (current) use of insulin: Secondary | ICD-10-CM | POA: Diagnosis not present

## 2019-03-18 ENCOUNTER — Encounter: Payer: Self-pay | Admitting: Internal Medicine

## 2019-04-03 DIAGNOSIS — I10 Essential (primary) hypertension: Secondary | ICD-10-CM | POA: Diagnosis not present

## 2019-04-03 DIAGNOSIS — N185 Chronic kidney disease, stage 5: Secondary | ICD-10-CM | POA: Diagnosis not present

## 2019-04-03 DIAGNOSIS — E1121 Type 2 diabetes mellitus with diabetic nephropathy: Secondary | ICD-10-CM | POA: Diagnosis not present

## 2019-04-03 DIAGNOSIS — E7800 Pure hypercholesterolemia, unspecified: Secondary | ICD-10-CM | POA: Diagnosis not present

## 2019-04-14 DIAGNOSIS — E11621 Type 2 diabetes mellitus with foot ulcer: Secondary | ICD-10-CM | POA: Diagnosis not present

## 2019-04-14 DIAGNOSIS — L97514 Non-pressure chronic ulcer of other part of right foot with necrosis of bone: Secondary | ICD-10-CM | POA: Diagnosis not present

## 2019-04-15 DIAGNOSIS — M86171 Other acute osteomyelitis, right ankle and foot: Secondary | ICD-10-CM | POA: Diagnosis not present

## 2019-04-15 HISTORY — PX: LEG AMPUTATION BELOW KNEE: SHX694

## 2019-04-25 DIAGNOSIS — L0291 Cutaneous abscess, unspecified: Secondary | ICD-10-CM | POA: Diagnosis not present

## 2019-04-25 DIAGNOSIS — I739 Peripheral vascular disease, unspecified: Secondary | ICD-10-CM | POA: Diagnosis not present

## 2019-04-25 DIAGNOSIS — T8753 Necrosis of amputation stump, right lower extremity: Secondary | ICD-10-CM | POA: Diagnosis not present

## 2019-04-25 DIAGNOSIS — L02611 Cutaneous abscess of right foot: Secondary | ICD-10-CM | POA: Diagnosis not present

## 2019-04-25 DIAGNOSIS — I70201 Unspecified atherosclerosis of native arteries of extremities, right leg: Secondary | ICD-10-CM | POA: Diagnosis not present

## 2019-04-25 DIAGNOSIS — I129 Hypertensive chronic kidney disease with stage 1 through stage 4 chronic kidney disease, or unspecified chronic kidney disease: Secondary | ICD-10-CM | POA: Diagnosis not present

## 2019-04-25 DIAGNOSIS — L97814 Non-pressure chronic ulcer of other part of right lower leg with necrosis of bone: Secondary | ICD-10-CM | POA: Diagnosis not present

## 2019-04-25 DIAGNOSIS — E11621 Type 2 diabetes mellitus with foot ulcer: Secondary | ICD-10-CM | POA: Diagnosis not present

## 2019-04-25 DIAGNOSIS — N185 Chronic kidney disease, stage 5: Secondary | ICD-10-CM | POA: Diagnosis not present

## 2019-04-25 DIAGNOSIS — E1065 Type 1 diabetes mellitus with hyperglycemia: Secondary | ICD-10-CM | POA: Diagnosis not present

## 2019-04-25 DIAGNOSIS — E7800 Pure hypercholesterolemia, unspecified: Secondary | ICD-10-CM | POA: Diagnosis not present

## 2019-04-25 DIAGNOSIS — Z6841 Body Mass Index (BMI) 40.0 and over, adult: Secondary | ICD-10-CM | POA: Diagnosis not present

## 2019-04-25 DIAGNOSIS — E119 Type 2 diabetes mellitus without complications: Secondary | ICD-10-CM | POA: Diagnosis not present

## 2019-04-25 DIAGNOSIS — E1169 Type 2 diabetes mellitus with other specified complication: Secondary | ICD-10-CM | POA: Diagnosis not present

## 2019-04-25 DIAGNOSIS — N184 Chronic kidney disease, stage 4 (severe): Secondary | ICD-10-CM | POA: Diagnosis not present

## 2019-04-25 DIAGNOSIS — L97516 Non-pressure chronic ulcer of other part of right foot with bone involvement without evidence of necrosis: Secondary | ICD-10-CM | POA: Diagnosis not present

## 2019-04-25 DIAGNOSIS — M19071 Primary osteoarthritis, right ankle and foot: Secondary | ICD-10-CM | POA: Diagnosis not present

## 2019-04-25 DIAGNOSIS — L03115 Cellulitis of right lower limb: Secondary | ICD-10-CM | POA: Diagnosis not present

## 2019-04-25 DIAGNOSIS — E872 Acidosis: Secondary | ICD-10-CM | POA: Diagnosis not present

## 2019-04-25 DIAGNOSIS — Z89429 Acquired absence of other toe(s), unspecified side: Secondary | ICD-10-CM | POA: Diagnosis not present

## 2019-04-25 DIAGNOSIS — I120 Hypertensive chronic kidney disease with stage 5 chronic kidney disease or end stage renal disease: Secondary | ICD-10-CM | POA: Diagnosis not present

## 2019-04-25 DIAGNOSIS — E1152 Type 2 diabetes mellitus with diabetic peripheral angiopathy with gangrene: Secondary | ICD-10-CM | POA: Diagnosis not present

## 2019-04-25 DIAGNOSIS — E871 Hypo-osmolality and hyponatremia: Secondary | ICD-10-CM | POA: Diagnosis not present

## 2019-04-25 DIAGNOSIS — A480 Gas gangrene: Secondary | ICD-10-CM | POA: Diagnosis not present

## 2019-04-25 DIAGNOSIS — Z20828 Contact with and (suspected) exposure to other viral communicable diseases: Secondary | ICD-10-CM | POA: Diagnosis not present

## 2019-04-25 DIAGNOSIS — M86171 Other acute osteomyelitis, right ankle and foot: Secondary | ICD-10-CM | POA: Diagnosis not present

## 2019-04-25 DIAGNOSIS — M7989 Other specified soft tissue disorders: Secondary | ICD-10-CM | POA: Diagnosis not present

## 2019-04-25 DIAGNOSIS — N179 Acute kidney failure, unspecified: Secondary | ICD-10-CM | POA: Diagnosis not present

## 2019-04-25 DIAGNOSIS — L97518 Non-pressure chronic ulcer of other part of right foot with other specified severity: Secondary | ICD-10-CM | POA: Diagnosis not present

## 2019-04-26 DIAGNOSIS — I120 Hypertensive chronic kidney disease with stage 5 chronic kidney disease or end stage renal disease: Secondary | ICD-10-CM | POA: Diagnosis not present

## 2019-04-26 DIAGNOSIS — N179 Acute kidney failure, unspecified: Secondary | ICD-10-CM | POA: Diagnosis not present

## 2019-04-26 DIAGNOSIS — N185 Chronic kidney disease, stage 5: Secondary | ICD-10-CM | POA: Diagnosis not present

## 2019-04-26 DIAGNOSIS — E1122 Type 2 diabetes mellitus with diabetic chronic kidney disease: Secondary | ICD-10-CM | POA: Diagnosis not present

## 2019-04-26 DIAGNOSIS — Z6841 Body Mass Index (BMI) 40.0 and over, adult: Secondary | ICD-10-CM | POA: Diagnosis not present

## 2019-04-26 DIAGNOSIS — M86171 Other acute osteomyelitis, right ankle and foot: Secondary | ICD-10-CM | POA: Diagnosis not present

## 2019-04-26 DIAGNOSIS — E1065 Type 1 diabetes mellitus with hyperglycemia: Secondary | ICD-10-CM | POA: Diagnosis not present

## 2019-04-26 DIAGNOSIS — L02611 Cutaneous abscess of right foot: Secondary | ICD-10-CM | POA: Diagnosis not present

## 2019-04-27 DIAGNOSIS — E1122 Type 2 diabetes mellitus with diabetic chronic kidney disease: Secondary | ICD-10-CM | POA: Diagnosis not present

## 2019-04-27 DIAGNOSIS — M86171 Other acute osteomyelitis, right ankle and foot: Secondary | ICD-10-CM | POA: Diagnosis not present

## 2019-04-27 DIAGNOSIS — N179 Acute kidney failure, unspecified: Secondary | ICD-10-CM | POA: Diagnosis not present

## 2019-04-27 DIAGNOSIS — N185 Chronic kidney disease, stage 5: Secondary | ICD-10-CM | POA: Diagnosis not present

## 2019-04-27 DIAGNOSIS — L02611 Cutaneous abscess of right foot: Secondary | ICD-10-CM | POA: Diagnosis not present

## 2019-04-27 DIAGNOSIS — I120 Hypertensive chronic kidney disease with stage 5 chronic kidney disease or end stage renal disease: Secondary | ICD-10-CM | POA: Diagnosis not present

## 2019-04-27 DIAGNOSIS — Z6841 Body Mass Index (BMI) 40.0 and over, adult: Secondary | ICD-10-CM | POA: Diagnosis not present

## 2019-04-28 DIAGNOSIS — E1065 Type 1 diabetes mellitus with hyperglycemia: Secondary | ICD-10-CM | POA: Diagnosis not present

## 2019-04-28 DIAGNOSIS — E1122 Type 2 diabetes mellitus with diabetic chronic kidney disease: Secondary | ICD-10-CM | POA: Diagnosis not present

## 2019-04-28 DIAGNOSIS — G8918 Other acute postprocedural pain: Secondary | ICD-10-CM | POA: Diagnosis not present

## 2019-04-28 DIAGNOSIS — E11628 Type 2 diabetes mellitus with other skin complications: Secondary | ICD-10-CM | POA: Diagnosis not present

## 2019-04-28 DIAGNOSIS — M25571 Pain in right ankle and joints of right foot: Secondary | ICD-10-CM | POA: Diagnosis not present

## 2019-04-28 DIAGNOSIS — B9562 Methicillin resistant Staphylococcus aureus infection as the cause of diseases classified elsewhere: Secondary | ICD-10-CM | POA: Diagnosis not present

## 2019-04-28 DIAGNOSIS — M86171 Other acute osteomyelitis, right ankle and foot: Secondary | ICD-10-CM | POA: Diagnosis not present

## 2019-04-28 DIAGNOSIS — E1152 Type 2 diabetes mellitus with diabetic peripheral angiopathy with gangrene: Secondary | ICD-10-CM | POA: Diagnosis not present

## 2019-04-28 DIAGNOSIS — N185 Chronic kidney disease, stage 5: Secondary | ICD-10-CM | POA: Diagnosis not present

## 2019-04-28 DIAGNOSIS — I120 Hypertensive chronic kidney disease with stage 5 chronic kidney disease or end stage renal disease: Secondary | ICD-10-CM | POA: Diagnosis not present

## 2019-04-28 DIAGNOSIS — I739 Peripheral vascular disease, unspecified: Secondary | ICD-10-CM | POA: Diagnosis not present

## 2019-04-28 DIAGNOSIS — Z6841 Body Mass Index (BMI) 40.0 and over, adult: Secondary | ICD-10-CM | POA: Diagnosis not present

## 2019-04-28 DIAGNOSIS — L02611 Cutaneous abscess of right foot: Secondary | ICD-10-CM | POA: Diagnosis not present

## 2019-04-28 DIAGNOSIS — N179 Acute kidney failure, unspecified: Secondary | ICD-10-CM | POA: Diagnosis not present

## 2019-04-29 DIAGNOSIS — E1122 Type 2 diabetes mellitus with diabetic chronic kidney disease: Secondary | ICD-10-CM | POA: Diagnosis not present

## 2019-04-29 DIAGNOSIS — I120 Hypertensive chronic kidney disease with stage 5 chronic kidney disease or end stage renal disease: Secondary | ICD-10-CM | POA: Diagnosis not present

## 2019-04-29 DIAGNOSIS — E1065 Type 1 diabetes mellitus with hyperglycemia: Secondary | ICD-10-CM | POA: Diagnosis not present

## 2019-04-29 DIAGNOSIS — L02611 Cutaneous abscess of right foot: Secondary | ICD-10-CM | POA: Diagnosis not present

## 2019-04-29 DIAGNOSIS — N185 Chronic kidney disease, stage 5: Secondary | ICD-10-CM | POA: Diagnosis not present

## 2019-04-29 DIAGNOSIS — N179 Acute kidney failure, unspecified: Secondary | ICD-10-CM | POA: Diagnosis not present

## 2019-04-30 DIAGNOSIS — N179 Acute kidney failure, unspecified: Secondary | ICD-10-CM | POA: Diagnosis not present

## 2019-04-30 DIAGNOSIS — L02611 Cutaneous abscess of right foot: Secondary | ICD-10-CM | POA: Diagnosis not present

## 2019-04-30 DIAGNOSIS — E1122 Type 2 diabetes mellitus with diabetic chronic kidney disease: Secondary | ICD-10-CM | POA: Diagnosis not present

## 2019-04-30 DIAGNOSIS — I120 Hypertensive chronic kidney disease with stage 5 chronic kidney disease or end stage renal disease: Secondary | ICD-10-CM | POA: Diagnosis not present

## 2019-04-30 DIAGNOSIS — N185 Chronic kidney disease, stage 5: Secondary | ICD-10-CM | POA: Diagnosis not present

## 2019-05-01 DIAGNOSIS — N179 Acute kidney failure, unspecified: Secondary | ICD-10-CM | POA: Diagnosis not present

## 2019-05-01 DIAGNOSIS — E1065 Type 1 diabetes mellitus with hyperglycemia: Secondary | ICD-10-CM | POA: Diagnosis not present

## 2019-05-01 DIAGNOSIS — I120 Hypertensive chronic kidney disease with stage 5 chronic kidney disease or end stage renal disease: Secondary | ICD-10-CM | POA: Diagnosis not present

## 2019-05-01 DIAGNOSIS — M86171 Other acute osteomyelitis, right ankle and foot: Secondary | ICD-10-CM | POA: Diagnosis not present

## 2019-05-01 DIAGNOSIS — E1122 Type 2 diabetes mellitus with diabetic chronic kidney disease: Secondary | ICD-10-CM | POA: Diagnosis not present

## 2019-05-01 DIAGNOSIS — N185 Chronic kidney disease, stage 5: Secondary | ICD-10-CM | POA: Diagnosis not present

## 2019-05-02 DIAGNOSIS — M79661 Pain in right lower leg: Secondary | ICD-10-CM | POA: Diagnosis not present

## 2019-05-02 DIAGNOSIS — E1065 Type 1 diabetes mellitus with hyperglycemia: Secondary | ICD-10-CM | POA: Diagnosis not present

## 2019-05-02 DIAGNOSIS — I120 Hypertensive chronic kidney disease with stage 5 chronic kidney disease or end stage renal disease: Secondary | ICD-10-CM | POA: Diagnosis not present

## 2019-05-02 DIAGNOSIS — I70261 Atherosclerosis of native arteries of extremities with gangrene, right leg: Secondary | ICD-10-CM | POA: Diagnosis not present

## 2019-05-02 DIAGNOSIS — E1122 Type 2 diabetes mellitus with diabetic chronic kidney disease: Secondary | ICD-10-CM | POA: Diagnosis not present

## 2019-05-02 DIAGNOSIS — G8918 Other acute postprocedural pain: Secondary | ICD-10-CM | POA: Diagnosis not present

## 2019-05-02 DIAGNOSIS — N179 Acute kidney failure, unspecified: Secondary | ICD-10-CM | POA: Diagnosis not present

## 2019-05-02 DIAGNOSIS — N185 Chronic kidney disease, stage 5: Secondary | ICD-10-CM | POA: Diagnosis not present

## 2019-05-02 DIAGNOSIS — T8753 Necrosis of amputation stump, right lower extremity: Secondary | ICD-10-CM | POA: Diagnosis not present

## 2019-05-02 DIAGNOSIS — I739 Peripheral vascular disease, unspecified: Secondary | ICD-10-CM | POA: Diagnosis not present

## 2019-05-03 DIAGNOSIS — M86171 Other acute osteomyelitis, right ankle and foot: Secondary | ICD-10-CM | POA: Diagnosis not present

## 2019-05-03 DIAGNOSIS — N185 Chronic kidney disease, stage 5: Secondary | ICD-10-CM | POA: Diagnosis not present

## 2019-05-03 DIAGNOSIS — E1122 Type 2 diabetes mellitus with diabetic chronic kidney disease: Secondary | ICD-10-CM | POA: Diagnosis not present

## 2019-05-03 DIAGNOSIS — E1065 Type 1 diabetes mellitus with hyperglycemia: Secondary | ICD-10-CM | POA: Diagnosis not present

## 2019-05-03 DIAGNOSIS — I120 Hypertensive chronic kidney disease with stage 5 chronic kidney disease or end stage renal disease: Secondary | ICD-10-CM | POA: Diagnosis not present

## 2019-05-03 DIAGNOSIS — I96 Gangrene, not elsewhere classified: Secondary | ICD-10-CM | POA: Diagnosis not present

## 2019-05-03 DIAGNOSIS — N179 Acute kidney failure, unspecified: Secondary | ICD-10-CM | POA: Diagnosis not present

## 2019-05-04 DIAGNOSIS — E1065 Type 1 diabetes mellitus with hyperglycemia: Secondary | ICD-10-CM | POA: Diagnosis not present

## 2019-05-04 DIAGNOSIS — E1122 Type 2 diabetes mellitus with diabetic chronic kidney disease: Secondary | ICD-10-CM | POA: Diagnosis not present

## 2019-05-04 DIAGNOSIS — N185 Chronic kidney disease, stage 5: Secondary | ICD-10-CM | POA: Diagnosis not present

## 2019-05-04 DIAGNOSIS — N179 Acute kidney failure, unspecified: Secondary | ICD-10-CM | POA: Diagnosis not present

## 2019-05-04 DIAGNOSIS — I120 Hypertensive chronic kidney disease with stage 5 chronic kidney disease or end stage renal disease: Secondary | ICD-10-CM | POA: Diagnosis not present

## 2019-05-05 DIAGNOSIS — N185 Chronic kidney disease, stage 5: Secondary | ICD-10-CM | POA: Diagnosis not present

## 2019-05-05 DIAGNOSIS — N179 Acute kidney failure, unspecified: Secondary | ICD-10-CM | POA: Diagnosis not present

## 2019-05-05 DIAGNOSIS — E1065 Type 1 diabetes mellitus with hyperglycemia: Secondary | ICD-10-CM | POA: Diagnosis not present

## 2019-05-05 DIAGNOSIS — E1122 Type 2 diabetes mellitus with diabetic chronic kidney disease: Secondary | ICD-10-CM | POA: Diagnosis not present

## 2019-05-05 DIAGNOSIS — I120 Hypertensive chronic kidney disease with stage 5 chronic kidney disease or end stage renal disease: Secondary | ICD-10-CM | POA: Diagnosis not present

## 2019-05-06 DIAGNOSIS — E1122 Type 2 diabetes mellitus with diabetic chronic kidney disease: Secondary | ICD-10-CM | POA: Diagnosis not present

## 2019-05-06 DIAGNOSIS — L02611 Cutaneous abscess of right foot: Secondary | ICD-10-CM | POA: Diagnosis not present

## 2019-05-06 DIAGNOSIS — N179 Acute kidney failure, unspecified: Secondary | ICD-10-CM | POA: Diagnosis not present

## 2019-05-06 DIAGNOSIS — N185 Chronic kidney disease, stage 5: Secondary | ICD-10-CM | POA: Diagnosis not present

## 2019-05-06 DIAGNOSIS — I120 Hypertensive chronic kidney disease with stage 5 chronic kidney disease or end stage renal disease: Secondary | ICD-10-CM | POA: Diagnosis not present

## 2019-05-06 DIAGNOSIS — E1065 Type 1 diabetes mellitus with hyperglycemia: Secondary | ICD-10-CM | POA: Diagnosis not present

## 2019-05-06 DIAGNOSIS — M86171 Other acute osteomyelitis, right ankle and foot: Secondary | ICD-10-CM | POA: Diagnosis not present

## 2019-05-07 DIAGNOSIS — E669 Obesity, unspecified: Secondary | ICD-10-CM | POA: Diagnosis not present

## 2019-05-07 DIAGNOSIS — N184 Chronic kidney disease, stage 4 (severe): Secondary | ICD-10-CM | POA: Diagnosis not present

## 2019-05-07 DIAGNOSIS — N185 Chronic kidney disease, stage 5: Secondary | ICD-10-CM | POA: Diagnosis not present

## 2019-05-07 DIAGNOSIS — N179 Acute kidney failure, unspecified: Secondary | ICD-10-CM | POA: Diagnosis not present

## 2019-05-07 DIAGNOSIS — Z794 Long term (current) use of insulin: Secondary | ICD-10-CM | POA: Diagnosis not present

## 2019-05-07 DIAGNOSIS — M86171 Other acute osteomyelitis, right ankle and foot: Secondary | ICD-10-CM | POA: Diagnosis not present

## 2019-05-07 DIAGNOSIS — E1065 Type 1 diabetes mellitus with hyperglycemia: Secondary | ICD-10-CM | POA: Diagnosis not present

## 2019-05-07 DIAGNOSIS — L02611 Cutaneous abscess of right foot: Secondary | ICD-10-CM | POA: Diagnosis not present

## 2019-05-07 DIAGNOSIS — I129 Hypertensive chronic kidney disease with stage 1 through stage 4 chronic kidney disease, or unspecified chronic kidney disease: Secondary | ICD-10-CM | POA: Diagnosis not present

## 2019-05-07 DIAGNOSIS — S88111A Complete traumatic amputation at level between knee and ankle, right lower leg, initial encounter: Secondary | ICD-10-CM | POA: Diagnosis not present

## 2019-05-07 DIAGNOSIS — Z89511 Acquired absence of right leg below knee: Secondary | ICD-10-CM | POA: Diagnosis not present

## 2019-05-07 DIAGNOSIS — D631 Anemia in chronic kidney disease: Secondary | ICD-10-CM | POA: Diagnosis not present

## 2019-05-07 DIAGNOSIS — Z6841 Body Mass Index (BMI) 40.0 and over, adult: Secondary | ICD-10-CM | POA: Diagnosis not present

## 2019-05-07 DIAGNOSIS — R5383 Other fatigue: Secondary | ICD-10-CM | POA: Diagnosis not present

## 2019-05-07 DIAGNOSIS — I120 Hypertensive chronic kidney disease with stage 5 chronic kidney disease or end stage renal disease: Secondary | ICD-10-CM | POA: Diagnosis not present

## 2019-05-07 DIAGNOSIS — G4733 Obstructive sleep apnea (adult) (pediatric): Secondary | ICD-10-CM | POA: Diagnosis not present

## 2019-05-07 DIAGNOSIS — E7800 Pure hypercholesterolemia, unspecified: Secondary | ICD-10-CM | POA: Diagnosis not present

## 2019-05-07 DIAGNOSIS — Z4781 Encounter for orthopedic aftercare following surgical amputation: Secondary | ICD-10-CM | POA: Diagnosis not present

## 2019-05-07 DIAGNOSIS — E1122 Type 2 diabetes mellitus with diabetic chronic kidney disease: Secondary | ICD-10-CM | POA: Diagnosis not present

## 2019-05-07 DIAGNOSIS — R52 Pain, unspecified: Secondary | ICD-10-CM | POA: Diagnosis not present

## 2019-05-08 DIAGNOSIS — E1122 Type 2 diabetes mellitus with diabetic chronic kidney disease: Secondary | ICD-10-CM | POA: Diagnosis not present

## 2019-05-08 DIAGNOSIS — N185 Chronic kidney disease, stage 5: Secondary | ICD-10-CM | POA: Diagnosis not present

## 2019-05-08 DIAGNOSIS — I120 Hypertensive chronic kidney disease with stage 5 chronic kidney disease or end stage renal disease: Secondary | ICD-10-CM | POA: Diagnosis not present

## 2019-05-08 DIAGNOSIS — S88111D Complete traumatic amputation at level between knee and ankle, right lower leg, subsequent encounter: Secondary | ICD-10-CM | POA: Diagnosis not present

## 2019-05-08 DIAGNOSIS — N179 Acute kidney failure, unspecified: Secondary | ICD-10-CM | POA: Diagnosis not present

## 2019-05-09 DIAGNOSIS — I120 Hypertensive chronic kidney disease with stage 5 chronic kidney disease or end stage renal disease: Secondary | ICD-10-CM | POA: Diagnosis not present

## 2019-05-09 DIAGNOSIS — N179 Acute kidney failure, unspecified: Secondary | ICD-10-CM | POA: Diagnosis not present

## 2019-05-09 DIAGNOSIS — N185 Chronic kidney disease, stage 5: Secondary | ICD-10-CM | POA: Diagnosis not present

## 2019-05-09 DIAGNOSIS — S88111D Complete traumatic amputation at level between knee and ankle, right lower leg, subsequent encounter: Secondary | ICD-10-CM | POA: Diagnosis not present

## 2019-05-09 DIAGNOSIS — E1122 Type 2 diabetes mellitus with diabetic chronic kidney disease: Secondary | ICD-10-CM | POA: Diagnosis not present

## 2019-05-10 DIAGNOSIS — E1122 Type 2 diabetes mellitus with diabetic chronic kidney disease: Secondary | ICD-10-CM | POA: Diagnosis not present

## 2019-05-10 DIAGNOSIS — I120 Hypertensive chronic kidney disease with stage 5 chronic kidney disease or end stage renal disease: Secondary | ICD-10-CM | POA: Diagnosis not present

## 2019-05-10 DIAGNOSIS — S88111D Complete traumatic amputation at level between knee and ankle, right lower leg, subsequent encounter: Secondary | ICD-10-CM | POA: Diagnosis not present

## 2019-05-10 DIAGNOSIS — N185 Chronic kidney disease, stage 5: Secondary | ICD-10-CM | POA: Diagnosis not present

## 2019-05-10 DIAGNOSIS — N179 Acute kidney failure, unspecified: Secondary | ICD-10-CM | POA: Diagnosis not present

## 2019-05-11 DIAGNOSIS — S88111D Complete traumatic amputation at level between knee and ankle, right lower leg, subsequent encounter: Secondary | ICD-10-CM | POA: Diagnosis not present

## 2019-05-11 DIAGNOSIS — N185 Chronic kidney disease, stage 5: Secondary | ICD-10-CM | POA: Diagnosis not present

## 2019-05-11 DIAGNOSIS — I120 Hypertensive chronic kidney disease with stage 5 chronic kidney disease or end stage renal disease: Secondary | ICD-10-CM | POA: Diagnosis not present

## 2019-05-11 DIAGNOSIS — N179 Acute kidney failure, unspecified: Secondary | ICD-10-CM | POA: Diagnosis not present

## 2019-05-11 DIAGNOSIS — E1122 Type 2 diabetes mellitus with diabetic chronic kidney disease: Secondary | ICD-10-CM | POA: Diagnosis not present

## 2019-05-12 DIAGNOSIS — E1122 Type 2 diabetes mellitus with diabetic chronic kidney disease: Secondary | ICD-10-CM | POA: Diagnosis not present

## 2019-05-12 DIAGNOSIS — S88111D Complete traumatic amputation at level between knee and ankle, right lower leg, subsequent encounter: Secondary | ICD-10-CM | POA: Diagnosis not present

## 2019-05-12 DIAGNOSIS — N185 Chronic kidney disease, stage 5: Secondary | ICD-10-CM | POA: Diagnosis not present

## 2019-05-12 DIAGNOSIS — N179 Acute kidney failure, unspecified: Secondary | ICD-10-CM | POA: Diagnosis not present

## 2019-05-12 DIAGNOSIS — I120 Hypertensive chronic kidney disease with stage 5 chronic kidney disease or end stage renal disease: Secondary | ICD-10-CM | POA: Diagnosis not present

## 2019-05-13 DIAGNOSIS — N185 Chronic kidney disease, stage 5: Secondary | ICD-10-CM | POA: Diagnosis not present

## 2019-05-13 DIAGNOSIS — N179 Acute kidney failure, unspecified: Secondary | ICD-10-CM | POA: Diagnosis not present

## 2019-05-13 DIAGNOSIS — E1122 Type 2 diabetes mellitus with diabetic chronic kidney disease: Secondary | ICD-10-CM | POA: Diagnosis not present

## 2019-05-13 DIAGNOSIS — I120 Hypertensive chronic kidney disease with stage 5 chronic kidney disease or end stage renal disease: Secondary | ICD-10-CM | POA: Diagnosis not present

## 2019-05-13 DIAGNOSIS — S88111D Complete traumatic amputation at level between knee and ankle, right lower leg, subsequent encounter: Secondary | ICD-10-CM | POA: Diagnosis not present

## 2019-05-14 DIAGNOSIS — I120 Hypertensive chronic kidney disease with stage 5 chronic kidney disease or end stage renal disease: Secondary | ICD-10-CM | POA: Diagnosis not present

## 2019-05-14 DIAGNOSIS — N179 Acute kidney failure, unspecified: Secondary | ICD-10-CM | POA: Diagnosis not present

## 2019-05-14 DIAGNOSIS — S88111D Complete traumatic amputation at level between knee and ankle, right lower leg, subsequent encounter: Secondary | ICD-10-CM | POA: Diagnosis not present

## 2019-05-14 DIAGNOSIS — N185 Chronic kidney disease, stage 5: Secondary | ICD-10-CM | POA: Diagnosis not present

## 2019-05-14 DIAGNOSIS — E1122 Type 2 diabetes mellitus with diabetic chronic kidney disease: Secondary | ICD-10-CM | POA: Diagnosis not present

## 2019-05-15 DIAGNOSIS — N185 Chronic kidney disease, stage 5: Secondary | ICD-10-CM | POA: Diagnosis not present

## 2019-05-15 DIAGNOSIS — N179 Acute kidney failure, unspecified: Secondary | ICD-10-CM | POA: Diagnosis not present

## 2019-05-15 DIAGNOSIS — E1122 Type 2 diabetes mellitus with diabetic chronic kidney disease: Secondary | ICD-10-CM | POA: Diagnosis not present

## 2019-05-15 DIAGNOSIS — S88111D Complete traumatic amputation at level between knee and ankle, right lower leg, subsequent encounter: Secondary | ICD-10-CM | POA: Diagnosis not present

## 2019-05-15 DIAGNOSIS — I120 Hypertensive chronic kidney disease with stage 5 chronic kidney disease or end stage renal disease: Secondary | ICD-10-CM | POA: Diagnosis not present

## 2019-05-16 DIAGNOSIS — I120 Hypertensive chronic kidney disease with stage 5 chronic kidney disease or end stage renal disease: Secondary | ICD-10-CM | POA: Diagnosis not present

## 2019-05-16 DIAGNOSIS — E1122 Type 2 diabetes mellitus with diabetic chronic kidney disease: Secondary | ICD-10-CM | POA: Diagnosis not present

## 2019-05-16 DIAGNOSIS — N179 Acute kidney failure, unspecified: Secondary | ICD-10-CM | POA: Diagnosis not present

## 2019-05-16 DIAGNOSIS — N185 Chronic kidney disease, stage 5: Secondary | ICD-10-CM | POA: Diagnosis not present

## 2019-05-16 DIAGNOSIS — S88111D Complete traumatic amputation at level between knee and ankle, right lower leg, subsequent encounter: Secondary | ICD-10-CM | POA: Diagnosis not present

## 2019-05-17 DIAGNOSIS — N185 Chronic kidney disease, stage 5: Secondary | ICD-10-CM | POA: Diagnosis not present

## 2019-05-17 DIAGNOSIS — E1122 Type 2 diabetes mellitus with diabetic chronic kidney disease: Secondary | ICD-10-CM | POA: Diagnosis not present

## 2019-05-17 DIAGNOSIS — N179 Acute kidney failure, unspecified: Secondary | ICD-10-CM | POA: Diagnosis not present

## 2019-05-17 DIAGNOSIS — I120 Hypertensive chronic kidney disease with stage 5 chronic kidney disease or end stage renal disease: Secondary | ICD-10-CM | POA: Diagnosis not present

## 2019-05-17 DIAGNOSIS — S88111D Complete traumatic amputation at level between knee and ankle, right lower leg, subsequent encounter: Secondary | ICD-10-CM | POA: Diagnosis not present

## 2019-05-18 DIAGNOSIS — N179 Acute kidney failure, unspecified: Secondary | ICD-10-CM | POA: Diagnosis not present

## 2019-05-18 DIAGNOSIS — E1122 Type 2 diabetes mellitus with diabetic chronic kidney disease: Secondary | ICD-10-CM | POA: Diagnosis not present

## 2019-05-18 DIAGNOSIS — I120 Hypertensive chronic kidney disease with stage 5 chronic kidney disease or end stage renal disease: Secondary | ICD-10-CM | POA: Diagnosis not present

## 2019-05-18 DIAGNOSIS — N185 Chronic kidney disease, stage 5: Secondary | ICD-10-CM | POA: Diagnosis not present

## 2019-05-18 DIAGNOSIS — S88111D Complete traumatic amputation at level between knee and ankle, right lower leg, subsequent encounter: Secondary | ICD-10-CM | POA: Diagnosis not present

## 2019-05-19 DIAGNOSIS — N179 Acute kidney failure, unspecified: Secondary | ICD-10-CM | POA: Diagnosis not present

## 2019-05-19 DIAGNOSIS — Z89611 Acquired absence of right leg above knee: Secondary | ICD-10-CM | POA: Diagnosis not present

## 2019-05-19 DIAGNOSIS — E1122 Type 2 diabetes mellitus with diabetic chronic kidney disease: Secondary | ICD-10-CM | POA: Diagnosis not present

## 2019-05-19 DIAGNOSIS — I120 Hypertensive chronic kidney disease with stage 5 chronic kidney disease or end stage renal disease: Secondary | ICD-10-CM | POA: Diagnosis not present

## 2019-05-19 DIAGNOSIS — S88111D Complete traumatic amputation at level between knee and ankle, right lower leg, subsequent encounter: Secondary | ICD-10-CM | POA: Diagnosis not present

## 2019-05-19 DIAGNOSIS — N185 Chronic kidney disease, stage 5: Secondary | ICD-10-CM | POA: Diagnosis not present

## 2019-05-20 DIAGNOSIS — N179 Acute kidney failure, unspecified: Secondary | ICD-10-CM | POA: Diagnosis not present

## 2019-05-20 DIAGNOSIS — E1122 Type 2 diabetes mellitus with diabetic chronic kidney disease: Secondary | ICD-10-CM | POA: Diagnosis not present

## 2019-05-20 DIAGNOSIS — I120 Hypertensive chronic kidney disease with stage 5 chronic kidney disease or end stage renal disease: Secondary | ICD-10-CM | POA: Diagnosis not present

## 2019-05-20 DIAGNOSIS — N185 Chronic kidney disease, stage 5: Secondary | ICD-10-CM | POA: Diagnosis not present

## 2019-05-20 DIAGNOSIS — S88111D Complete traumatic amputation at level between knee and ankle, right lower leg, subsequent encounter: Secondary | ICD-10-CM | POA: Diagnosis not present

## 2019-05-21 DIAGNOSIS — Z89611 Acquired absence of right leg above knee: Secondary | ICD-10-CM | POA: Diagnosis not present

## 2019-05-23 DIAGNOSIS — Z4781 Encounter for orthopedic aftercare following surgical amputation: Secondary | ICD-10-CM | POA: Diagnosis not present

## 2019-05-23 DIAGNOSIS — E7800 Pure hypercholesterolemia, unspecified: Secondary | ICD-10-CM | POA: Diagnosis not present

## 2019-05-23 DIAGNOSIS — Z6841 Body Mass Index (BMI) 40.0 and over, adult: Secondary | ICD-10-CM | POA: Diagnosis not present

## 2019-05-23 DIAGNOSIS — Z9181 History of falling: Secondary | ICD-10-CM | POA: Diagnosis not present

## 2019-05-23 DIAGNOSIS — I120 Hypertensive chronic kidney disease with stage 5 chronic kidney disease or end stage renal disease: Secondary | ICD-10-CM | POA: Diagnosis not present

## 2019-05-23 DIAGNOSIS — E1151 Type 2 diabetes mellitus with diabetic peripheral angiopathy without gangrene: Secondary | ICD-10-CM | POA: Diagnosis not present

## 2019-05-23 DIAGNOSIS — D631 Anemia in chronic kidney disease: Secondary | ICD-10-CM | POA: Diagnosis not present

## 2019-05-23 DIAGNOSIS — E1122 Type 2 diabetes mellitus with diabetic chronic kidney disease: Secondary | ICD-10-CM | POA: Diagnosis not present

## 2019-05-23 DIAGNOSIS — Z975 Presence of (intrauterine) contraceptive device: Secondary | ICD-10-CM | POA: Diagnosis not present

## 2019-05-23 DIAGNOSIS — N185 Chronic kidney disease, stage 5: Secondary | ICD-10-CM | POA: Diagnosis not present

## 2019-05-23 DIAGNOSIS — Z794 Long term (current) use of insulin: Secondary | ICD-10-CM | POA: Diagnosis not present

## 2019-05-23 DIAGNOSIS — G4733 Obstructive sleep apnea (adult) (pediatric): Secondary | ICD-10-CM | POA: Diagnosis not present

## 2019-05-23 DIAGNOSIS — Z89511 Acquired absence of right leg below knee: Secondary | ICD-10-CM | POA: Diagnosis not present

## 2019-05-23 DIAGNOSIS — E669 Obesity, unspecified: Secondary | ICD-10-CM | POA: Diagnosis not present

## 2019-05-26 DIAGNOSIS — Z794 Long term (current) use of insulin: Secondary | ICD-10-CM | POA: Diagnosis not present

## 2019-05-26 DIAGNOSIS — N185 Chronic kidney disease, stage 5: Secondary | ICD-10-CM | POA: Diagnosis not present

## 2019-05-26 DIAGNOSIS — Z9181 History of falling: Secondary | ICD-10-CM | POA: Diagnosis not present

## 2019-05-26 DIAGNOSIS — Z89511 Acquired absence of right leg below knee: Secondary | ICD-10-CM | POA: Diagnosis not present

## 2019-05-26 DIAGNOSIS — E1151 Type 2 diabetes mellitus with diabetic peripheral angiopathy without gangrene: Secondary | ICD-10-CM | POA: Diagnosis not present

## 2019-05-26 DIAGNOSIS — Z4781 Encounter for orthopedic aftercare following surgical amputation: Secondary | ICD-10-CM | POA: Diagnosis not present

## 2019-05-26 DIAGNOSIS — G4733 Obstructive sleep apnea (adult) (pediatric): Secondary | ICD-10-CM | POA: Diagnosis not present

## 2019-05-26 DIAGNOSIS — E7800 Pure hypercholesterolemia, unspecified: Secondary | ICD-10-CM | POA: Diagnosis not present

## 2019-05-26 DIAGNOSIS — E1122 Type 2 diabetes mellitus with diabetic chronic kidney disease: Secondary | ICD-10-CM | POA: Diagnosis not present

## 2019-05-26 DIAGNOSIS — E669 Obesity, unspecified: Secondary | ICD-10-CM | POA: Diagnosis not present

## 2019-05-26 DIAGNOSIS — D631 Anemia in chronic kidney disease: Secondary | ICD-10-CM | POA: Diagnosis not present

## 2019-05-26 DIAGNOSIS — I120 Hypertensive chronic kidney disease with stage 5 chronic kidney disease or end stage renal disease: Secondary | ICD-10-CM | POA: Diagnosis not present

## 2019-05-26 DIAGNOSIS — Z6841 Body Mass Index (BMI) 40.0 and over, adult: Secondary | ICD-10-CM | POA: Diagnosis not present

## 2019-05-26 DIAGNOSIS — Z975 Presence of (intrauterine) contraceptive device: Secondary | ICD-10-CM | POA: Diagnosis not present

## 2019-05-27 DIAGNOSIS — E1122 Type 2 diabetes mellitus with diabetic chronic kidney disease: Secondary | ICD-10-CM | POA: Diagnosis not present

## 2019-05-27 DIAGNOSIS — D631 Anemia in chronic kidney disease: Secondary | ICD-10-CM | POA: Diagnosis not present

## 2019-05-27 DIAGNOSIS — I120 Hypertensive chronic kidney disease with stage 5 chronic kidney disease or end stage renal disease: Secondary | ICD-10-CM | POA: Diagnosis not present

## 2019-05-27 DIAGNOSIS — Z89511 Acquired absence of right leg below knee: Secondary | ICD-10-CM | POA: Diagnosis not present

## 2019-05-27 DIAGNOSIS — Z6841 Body Mass Index (BMI) 40.0 and over, adult: Secondary | ICD-10-CM | POA: Diagnosis not present

## 2019-05-27 DIAGNOSIS — Z975 Presence of (intrauterine) contraceptive device: Secondary | ICD-10-CM | POA: Diagnosis not present

## 2019-05-27 DIAGNOSIS — Z794 Long term (current) use of insulin: Secondary | ICD-10-CM | POA: Diagnosis not present

## 2019-05-27 DIAGNOSIS — N185 Chronic kidney disease, stage 5: Secondary | ICD-10-CM | POA: Diagnosis not present

## 2019-05-27 DIAGNOSIS — Z9181 History of falling: Secondary | ICD-10-CM | POA: Diagnosis not present

## 2019-05-27 DIAGNOSIS — E7800 Pure hypercholesterolemia, unspecified: Secondary | ICD-10-CM | POA: Diagnosis not present

## 2019-05-27 DIAGNOSIS — G4733 Obstructive sleep apnea (adult) (pediatric): Secondary | ICD-10-CM | POA: Diagnosis not present

## 2019-05-27 DIAGNOSIS — E669 Obesity, unspecified: Secondary | ICD-10-CM | POA: Diagnosis not present

## 2019-05-27 DIAGNOSIS — E1151 Type 2 diabetes mellitus with diabetic peripheral angiopathy without gangrene: Secondary | ICD-10-CM | POA: Diagnosis not present

## 2019-05-27 DIAGNOSIS — Z4781 Encounter for orthopedic aftercare following surgical amputation: Secondary | ICD-10-CM | POA: Diagnosis not present

## 2019-05-29 LAB — HEMOGLOBIN A1C: Hemoglobin A1C: 6.8

## 2019-05-30 DIAGNOSIS — I120 Hypertensive chronic kidney disease with stage 5 chronic kidney disease or end stage renal disease: Secondary | ICD-10-CM | POA: Diagnosis not present

## 2019-05-30 DIAGNOSIS — Z6841 Body Mass Index (BMI) 40.0 and over, adult: Secondary | ICD-10-CM | POA: Diagnosis not present

## 2019-05-30 DIAGNOSIS — E1151 Type 2 diabetes mellitus with diabetic peripheral angiopathy without gangrene: Secondary | ICD-10-CM | POA: Diagnosis not present

## 2019-05-30 DIAGNOSIS — E669 Obesity, unspecified: Secondary | ICD-10-CM | POA: Diagnosis not present

## 2019-05-30 DIAGNOSIS — Z794 Long term (current) use of insulin: Secondary | ICD-10-CM | POA: Diagnosis not present

## 2019-05-30 DIAGNOSIS — Z975 Presence of (intrauterine) contraceptive device: Secondary | ICD-10-CM | POA: Diagnosis not present

## 2019-05-30 DIAGNOSIS — D631 Anemia in chronic kidney disease: Secondary | ICD-10-CM | POA: Diagnosis not present

## 2019-05-30 DIAGNOSIS — E7800 Pure hypercholesterolemia, unspecified: Secondary | ICD-10-CM | POA: Diagnosis not present

## 2019-05-30 DIAGNOSIS — Z4781 Encounter for orthopedic aftercare following surgical amputation: Secondary | ICD-10-CM | POA: Diagnosis not present

## 2019-05-30 DIAGNOSIS — G4733 Obstructive sleep apnea (adult) (pediatric): Secondary | ICD-10-CM | POA: Diagnosis not present

## 2019-05-30 DIAGNOSIS — E1122 Type 2 diabetes mellitus with diabetic chronic kidney disease: Secondary | ICD-10-CM | POA: Diagnosis not present

## 2019-05-30 DIAGNOSIS — Z89511 Acquired absence of right leg below knee: Secondary | ICD-10-CM | POA: Diagnosis not present

## 2019-05-30 DIAGNOSIS — Z9181 History of falling: Secondary | ICD-10-CM | POA: Diagnosis not present

## 2019-05-30 DIAGNOSIS — N185 Chronic kidney disease, stage 5: Secondary | ICD-10-CM | POA: Diagnosis not present

## 2019-06-02 DIAGNOSIS — Z89511 Acquired absence of right leg below knee: Secondary | ICD-10-CM | POA: Diagnosis not present

## 2019-06-02 DIAGNOSIS — N184 Chronic kidney disease, stage 4 (severe): Secondary | ICD-10-CM | POA: Diagnosis not present

## 2019-06-03 DIAGNOSIS — Z89511 Acquired absence of right leg below knee: Secondary | ICD-10-CM | POA: Diagnosis not present

## 2019-06-03 DIAGNOSIS — Z975 Presence of (intrauterine) contraceptive device: Secondary | ICD-10-CM | POA: Diagnosis not present

## 2019-06-03 DIAGNOSIS — Z4781 Encounter for orthopedic aftercare following surgical amputation: Secondary | ICD-10-CM | POA: Diagnosis not present

## 2019-06-03 DIAGNOSIS — G4733 Obstructive sleep apnea (adult) (pediatric): Secondary | ICD-10-CM | POA: Diagnosis not present

## 2019-06-03 DIAGNOSIS — E1122 Type 2 diabetes mellitus with diabetic chronic kidney disease: Secondary | ICD-10-CM | POA: Diagnosis not present

## 2019-06-03 DIAGNOSIS — E669 Obesity, unspecified: Secondary | ICD-10-CM | POA: Diagnosis not present

## 2019-06-03 DIAGNOSIS — Z6841 Body Mass Index (BMI) 40.0 and over, adult: Secondary | ICD-10-CM | POA: Diagnosis not present

## 2019-06-03 DIAGNOSIS — I120 Hypertensive chronic kidney disease with stage 5 chronic kidney disease or end stage renal disease: Secondary | ICD-10-CM | POA: Diagnosis not present

## 2019-06-03 DIAGNOSIS — E7800 Pure hypercholesterolemia, unspecified: Secondary | ICD-10-CM | POA: Diagnosis not present

## 2019-06-03 DIAGNOSIS — N185 Chronic kidney disease, stage 5: Secondary | ICD-10-CM | POA: Diagnosis not present

## 2019-06-03 DIAGNOSIS — E1151 Type 2 diabetes mellitus with diabetic peripheral angiopathy without gangrene: Secondary | ICD-10-CM | POA: Diagnosis not present

## 2019-06-03 DIAGNOSIS — Z9181 History of falling: Secondary | ICD-10-CM | POA: Diagnosis not present

## 2019-06-03 DIAGNOSIS — Z794 Long term (current) use of insulin: Secondary | ICD-10-CM | POA: Diagnosis not present

## 2019-06-03 DIAGNOSIS — D631 Anemia in chronic kidney disease: Secondary | ICD-10-CM | POA: Diagnosis not present

## 2019-06-04 DIAGNOSIS — Z6841 Body Mass Index (BMI) 40.0 and over, adult: Secondary | ICD-10-CM | POA: Diagnosis not present

## 2019-06-04 DIAGNOSIS — Z794 Long term (current) use of insulin: Secondary | ICD-10-CM | POA: Diagnosis not present

## 2019-06-04 DIAGNOSIS — E7800 Pure hypercholesterolemia, unspecified: Secondary | ICD-10-CM | POA: Diagnosis not present

## 2019-06-04 DIAGNOSIS — E1122 Type 2 diabetes mellitus with diabetic chronic kidney disease: Secondary | ICD-10-CM | POA: Diagnosis not present

## 2019-06-04 DIAGNOSIS — I120 Hypertensive chronic kidney disease with stage 5 chronic kidney disease or end stage renal disease: Secondary | ICD-10-CM | POA: Diagnosis not present

## 2019-06-04 DIAGNOSIS — Z975 Presence of (intrauterine) contraceptive device: Secondary | ICD-10-CM | POA: Diagnosis not present

## 2019-06-04 DIAGNOSIS — Z9181 History of falling: Secondary | ICD-10-CM | POA: Diagnosis not present

## 2019-06-04 DIAGNOSIS — E669 Obesity, unspecified: Secondary | ICD-10-CM | POA: Diagnosis not present

## 2019-06-04 DIAGNOSIS — G4733 Obstructive sleep apnea (adult) (pediatric): Secondary | ICD-10-CM | POA: Diagnosis not present

## 2019-06-04 DIAGNOSIS — N185 Chronic kidney disease, stage 5: Secondary | ICD-10-CM | POA: Diagnosis not present

## 2019-06-04 DIAGNOSIS — Z89511 Acquired absence of right leg below knee: Secondary | ICD-10-CM | POA: Diagnosis not present

## 2019-06-04 DIAGNOSIS — E1151 Type 2 diabetes mellitus with diabetic peripheral angiopathy without gangrene: Secondary | ICD-10-CM | POA: Diagnosis not present

## 2019-06-04 DIAGNOSIS — Z4781 Encounter for orthopedic aftercare following surgical amputation: Secondary | ICD-10-CM | POA: Diagnosis not present

## 2019-06-04 DIAGNOSIS — D631 Anemia in chronic kidney disease: Secondary | ICD-10-CM | POA: Diagnosis not present

## 2019-06-06 DIAGNOSIS — D631 Anemia in chronic kidney disease: Secondary | ICD-10-CM | POA: Diagnosis not present

## 2019-06-06 DIAGNOSIS — E669 Obesity, unspecified: Secondary | ICD-10-CM | POA: Diagnosis not present

## 2019-06-06 DIAGNOSIS — N185 Chronic kidney disease, stage 5: Secondary | ICD-10-CM | POA: Diagnosis not present

## 2019-06-06 DIAGNOSIS — E7800 Pure hypercholesterolemia, unspecified: Secondary | ICD-10-CM | POA: Diagnosis not present

## 2019-06-06 DIAGNOSIS — G4733 Obstructive sleep apnea (adult) (pediatric): Secondary | ICD-10-CM | POA: Diagnosis not present

## 2019-06-06 DIAGNOSIS — E1151 Type 2 diabetes mellitus with diabetic peripheral angiopathy without gangrene: Secondary | ICD-10-CM | POA: Diagnosis not present

## 2019-06-06 DIAGNOSIS — E1122 Type 2 diabetes mellitus with diabetic chronic kidney disease: Secondary | ICD-10-CM | POA: Diagnosis not present

## 2019-06-06 DIAGNOSIS — Z9181 History of falling: Secondary | ICD-10-CM | POA: Diagnosis not present

## 2019-06-06 DIAGNOSIS — Z89511 Acquired absence of right leg below knee: Secondary | ICD-10-CM | POA: Diagnosis not present

## 2019-06-06 DIAGNOSIS — I120 Hypertensive chronic kidney disease with stage 5 chronic kidney disease or end stage renal disease: Secondary | ICD-10-CM | POA: Diagnosis not present

## 2019-06-06 DIAGNOSIS — Z4781 Encounter for orthopedic aftercare following surgical amputation: Secondary | ICD-10-CM | POA: Diagnosis not present

## 2019-06-06 DIAGNOSIS — Z975 Presence of (intrauterine) contraceptive device: Secondary | ICD-10-CM | POA: Diagnosis not present

## 2019-06-06 DIAGNOSIS — Z6841 Body Mass Index (BMI) 40.0 and over, adult: Secondary | ICD-10-CM | POA: Diagnosis not present

## 2019-06-06 DIAGNOSIS — Z794 Long term (current) use of insulin: Secondary | ICD-10-CM | POA: Diagnosis not present

## 2019-06-09 DIAGNOSIS — Z4781 Encounter for orthopedic aftercare following surgical amputation: Secondary | ICD-10-CM | POA: Diagnosis not present

## 2019-06-09 DIAGNOSIS — N185 Chronic kidney disease, stage 5: Secondary | ICD-10-CM | POA: Diagnosis not present

## 2019-06-09 DIAGNOSIS — Z89511 Acquired absence of right leg below knee: Secondary | ICD-10-CM | POA: Diagnosis not present

## 2019-06-09 DIAGNOSIS — E1151 Type 2 diabetes mellitus with diabetic peripheral angiopathy without gangrene: Secondary | ICD-10-CM | POA: Diagnosis not present

## 2019-06-09 DIAGNOSIS — I120 Hypertensive chronic kidney disease with stage 5 chronic kidney disease or end stage renal disease: Secondary | ICD-10-CM | POA: Diagnosis not present

## 2019-06-09 DIAGNOSIS — Z794 Long term (current) use of insulin: Secondary | ICD-10-CM | POA: Diagnosis not present

## 2019-06-09 DIAGNOSIS — E1122 Type 2 diabetes mellitus with diabetic chronic kidney disease: Secondary | ICD-10-CM | POA: Diagnosis not present

## 2019-06-09 DIAGNOSIS — Z9181 History of falling: Secondary | ICD-10-CM | POA: Diagnosis not present

## 2019-06-09 DIAGNOSIS — D631 Anemia in chronic kidney disease: Secondary | ICD-10-CM | POA: Diagnosis not present

## 2019-06-09 DIAGNOSIS — E7800 Pure hypercholesterolemia, unspecified: Secondary | ICD-10-CM | POA: Diagnosis not present

## 2019-06-09 DIAGNOSIS — Z6841 Body Mass Index (BMI) 40.0 and over, adult: Secondary | ICD-10-CM | POA: Diagnosis not present

## 2019-06-09 DIAGNOSIS — E669 Obesity, unspecified: Secondary | ICD-10-CM | POA: Diagnosis not present

## 2019-06-09 DIAGNOSIS — Z975 Presence of (intrauterine) contraceptive device: Secondary | ICD-10-CM | POA: Diagnosis not present

## 2019-06-09 DIAGNOSIS — G4733 Obstructive sleep apnea (adult) (pediatric): Secondary | ICD-10-CM | POA: Diagnosis not present

## 2019-06-12 DIAGNOSIS — Z9181 History of falling: Secondary | ICD-10-CM | POA: Diagnosis not present

## 2019-06-12 DIAGNOSIS — E669 Obesity, unspecified: Secondary | ICD-10-CM | POA: Diagnosis not present

## 2019-06-12 DIAGNOSIS — Z794 Long term (current) use of insulin: Secondary | ICD-10-CM | POA: Diagnosis not present

## 2019-06-12 DIAGNOSIS — I120 Hypertensive chronic kidney disease with stage 5 chronic kidney disease or end stage renal disease: Secondary | ICD-10-CM | POA: Diagnosis not present

## 2019-06-12 DIAGNOSIS — Z6841 Body Mass Index (BMI) 40.0 and over, adult: Secondary | ICD-10-CM | POA: Diagnosis not present

## 2019-06-12 DIAGNOSIS — E1151 Type 2 diabetes mellitus with diabetic peripheral angiopathy without gangrene: Secondary | ICD-10-CM | POA: Diagnosis not present

## 2019-06-12 DIAGNOSIS — Z975 Presence of (intrauterine) contraceptive device: Secondary | ICD-10-CM | POA: Diagnosis not present

## 2019-06-12 DIAGNOSIS — D631 Anemia in chronic kidney disease: Secondary | ICD-10-CM | POA: Diagnosis not present

## 2019-06-12 DIAGNOSIS — Z89511 Acquired absence of right leg below knee: Secondary | ICD-10-CM | POA: Diagnosis not present

## 2019-06-12 DIAGNOSIS — N185 Chronic kidney disease, stage 5: Secondary | ICD-10-CM | POA: Diagnosis not present

## 2019-06-12 DIAGNOSIS — E1122 Type 2 diabetes mellitus with diabetic chronic kidney disease: Secondary | ICD-10-CM | POA: Diagnosis not present

## 2019-06-12 DIAGNOSIS — G4733 Obstructive sleep apnea (adult) (pediatric): Secondary | ICD-10-CM | POA: Diagnosis not present

## 2019-06-12 DIAGNOSIS — E7800 Pure hypercholesterolemia, unspecified: Secondary | ICD-10-CM | POA: Diagnosis not present

## 2019-06-12 DIAGNOSIS — Z4781 Encounter for orthopedic aftercare following surgical amputation: Secondary | ICD-10-CM | POA: Diagnosis not present

## 2019-06-13 DIAGNOSIS — Z9181 History of falling: Secondary | ICD-10-CM | POA: Diagnosis not present

## 2019-06-13 DIAGNOSIS — E7800 Pure hypercholesterolemia, unspecified: Secondary | ICD-10-CM | POA: Diagnosis not present

## 2019-06-13 DIAGNOSIS — Z89511 Acquired absence of right leg below knee: Secondary | ICD-10-CM | POA: Diagnosis not present

## 2019-06-13 DIAGNOSIS — Z6841 Body Mass Index (BMI) 40.0 and over, adult: Secondary | ICD-10-CM | POA: Diagnosis not present

## 2019-06-13 DIAGNOSIS — I120 Hypertensive chronic kidney disease with stage 5 chronic kidney disease or end stage renal disease: Secondary | ICD-10-CM | POA: Diagnosis not present

## 2019-06-13 DIAGNOSIS — Z4781 Encounter for orthopedic aftercare following surgical amputation: Secondary | ICD-10-CM | POA: Diagnosis not present

## 2019-06-13 DIAGNOSIS — N185 Chronic kidney disease, stage 5: Secondary | ICD-10-CM | POA: Diagnosis not present

## 2019-06-13 DIAGNOSIS — E1151 Type 2 diabetes mellitus with diabetic peripheral angiopathy without gangrene: Secondary | ICD-10-CM | POA: Diagnosis not present

## 2019-06-13 DIAGNOSIS — G4733 Obstructive sleep apnea (adult) (pediatric): Secondary | ICD-10-CM | POA: Diagnosis not present

## 2019-06-13 DIAGNOSIS — D631 Anemia in chronic kidney disease: Secondary | ICD-10-CM | POA: Diagnosis not present

## 2019-06-13 DIAGNOSIS — E1122 Type 2 diabetes mellitus with diabetic chronic kidney disease: Secondary | ICD-10-CM | POA: Diagnosis not present

## 2019-06-13 DIAGNOSIS — E669 Obesity, unspecified: Secondary | ICD-10-CM | POA: Diagnosis not present

## 2019-06-13 DIAGNOSIS — Z794 Long term (current) use of insulin: Secondary | ICD-10-CM | POA: Diagnosis not present

## 2019-06-13 DIAGNOSIS — Z975 Presence of (intrauterine) contraceptive device: Secondary | ICD-10-CM | POA: Diagnosis not present

## 2019-06-17 DIAGNOSIS — D631 Anemia in chronic kidney disease: Secondary | ICD-10-CM | POA: Diagnosis not present

## 2019-06-17 DIAGNOSIS — Z6841 Body Mass Index (BMI) 40.0 and over, adult: Secondary | ICD-10-CM | POA: Diagnosis not present

## 2019-06-17 DIAGNOSIS — E669 Obesity, unspecified: Secondary | ICD-10-CM | POA: Diagnosis not present

## 2019-06-17 DIAGNOSIS — Z89511 Acquired absence of right leg below knee: Secondary | ICD-10-CM | POA: Diagnosis not present

## 2019-06-17 DIAGNOSIS — N185 Chronic kidney disease, stage 5: Secondary | ICD-10-CM | POA: Diagnosis not present

## 2019-06-17 DIAGNOSIS — Z794 Long term (current) use of insulin: Secondary | ICD-10-CM | POA: Diagnosis not present

## 2019-06-17 DIAGNOSIS — E1151 Type 2 diabetes mellitus with diabetic peripheral angiopathy without gangrene: Secondary | ICD-10-CM | POA: Diagnosis not present

## 2019-06-17 DIAGNOSIS — Z4781 Encounter for orthopedic aftercare following surgical amputation: Secondary | ICD-10-CM | POA: Diagnosis not present

## 2019-06-17 DIAGNOSIS — Z975 Presence of (intrauterine) contraceptive device: Secondary | ICD-10-CM | POA: Diagnosis not present

## 2019-06-17 DIAGNOSIS — G4733 Obstructive sleep apnea (adult) (pediatric): Secondary | ICD-10-CM | POA: Diagnosis not present

## 2019-06-17 DIAGNOSIS — E1122 Type 2 diabetes mellitus with diabetic chronic kidney disease: Secondary | ICD-10-CM | POA: Diagnosis not present

## 2019-06-17 DIAGNOSIS — E7800 Pure hypercholesterolemia, unspecified: Secondary | ICD-10-CM | POA: Diagnosis not present

## 2019-06-17 DIAGNOSIS — I120 Hypertensive chronic kidney disease with stage 5 chronic kidney disease or end stage renal disease: Secondary | ICD-10-CM | POA: Diagnosis not present

## 2019-06-17 DIAGNOSIS — Z9181 History of falling: Secondary | ICD-10-CM | POA: Diagnosis not present

## 2019-06-18 DIAGNOSIS — E7800 Pure hypercholesterolemia, unspecified: Secondary | ICD-10-CM | POA: Diagnosis not present

## 2019-06-18 DIAGNOSIS — Z89511 Acquired absence of right leg below knee: Secondary | ICD-10-CM | POA: Diagnosis not present

## 2019-06-18 DIAGNOSIS — Z4781 Encounter for orthopedic aftercare following surgical amputation: Secondary | ICD-10-CM | POA: Diagnosis not present

## 2019-06-18 DIAGNOSIS — Z794 Long term (current) use of insulin: Secondary | ICD-10-CM | POA: Diagnosis not present

## 2019-06-18 DIAGNOSIS — Z9181 History of falling: Secondary | ICD-10-CM | POA: Diagnosis not present

## 2019-06-18 DIAGNOSIS — E1122 Type 2 diabetes mellitus with diabetic chronic kidney disease: Secondary | ICD-10-CM | POA: Diagnosis not present

## 2019-06-18 DIAGNOSIS — E669 Obesity, unspecified: Secondary | ICD-10-CM | POA: Diagnosis not present

## 2019-06-18 DIAGNOSIS — Z6841 Body Mass Index (BMI) 40.0 and over, adult: Secondary | ICD-10-CM | POA: Diagnosis not present

## 2019-06-18 DIAGNOSIS — I120 Hypertensive chronic kidney disease with stage 5 chronic kidney disease or end stage renal disease: Secondary | ICD-10-CM | POA: Diagnosis not present

## 2019-06-18 DIAGNOSIS — E1151 Type 2 diabetes mellitus with diabetic peripheral angiopathy without gangrene: Secondary | ICD-10-CM | POA: Diagnosis not present

## 2019-06-18 DIAGNOSIS — G4733 Obstructive sleep apnea (adult) (pediatric): Secondary | ICD-10-CM | POA: Diagnosis not present

## 2019-06-18 DIAGNOSIS — N185 Chronic kidney disease, stage 5: Secondary | ICD-10-CM | POA: Diagnosis not present

## 2019-06-18 DIAGNOSIS — Z975 Presence of (intrauterine) contraceptive device: Secondary | ICD-10-CM | POA: Diagnosis not present

## 2019-06-18 DIAGNOSIS — D631 Anemia in chronic kidney disease: Secondary | ICD-10-CM | POA: Diagnosis not present

## 2019-06-19 DIAGNOSIS — I120 Hypertensive chronic kidney disease with stage 5 chronic kidney disease or end stage renal disease: Secondary | ICD-10-CM | POA: Diagnosis not present

## 2019-06-19 DIAGNOSIS — G4733 Obstructive sleep apnea (adult) (pediatric): Secondary | ICD-10-CM | POA: Diagnosis not present

## 2019-06-19 DIAGNOSIS — E1151 Type 2 diabetes mellitus with diabetic peripheral angiopathy without gangrene: Secondary | ICD-10-CM | POA: Diagnosis not present

## 2019-06-19 DIAGNOSIS — E1122 Type 2 diabetes mellitus with diabetic chronic kidney disease: Secondary | ICD-10-CM | POA: Diagnosis not present

## 2019-06-19 DIAGNOSIS — E7800 Pure hypercholesterolemia, unspecified: Secondary | ICD-10-CM | POA: Diagnosis not present

## 2019-06-19 DIAGNOSIS — Z975 Presence of (intrauterine) contraceptive device: Secondary | ICD-10-CM | POA: Diagnosis not present

## 2019-06-19 DIAGNOSIS — Z9181 History of falling: Secondary | ICD-10-CM | POA: Diagnosis not present

## 2019-06-19 DIAGNOSIS — N185 Chronic kidney disease, stage 5: Secondary | ICD-10-CM | POA: Diagnosis not present

## 2019-06-19 DIAGNOSIS — D631 Anemia in chronic kidney disease: Secondary | ICD-10-CM | POA: Diagnosis not present

## 2019-06-19 DIAGNOSIS — E669 Obesity, unspecified: Secondary | ICD-10-CM | POA: Diagnosis not present

## 2019-06-19 DIAGNOSIS — Z794 Long term (current) use of insulin: Secondary | ICD-10-CM | POA: Diagnosis not present

## 2019-06-19 DIAGNOSIS — Z6841 Body Mass Index (BMI) 40.0 and over, adult: Secondary | ICD-10-CM | POA: Diagnosis not present

## 2019-06-19 DIAGNOSIS — Z89511 Acquired absence of right leg below knee: Secondary | ICD-10-CM | POA: Diagnosis not present

## 2019-06-19 DIAGNOSIS — Z89611 Acquired absence of right leg above knee: Secondary | ICD-10-CM | POA: Diagnosis not present

## 2019-06-19 DIAGNOSIS — Z4781 Encounter for orthopedic aftercare following surgical amputation: Secondary | ICD-10-CM | POA: Diagnosis not present

## 2019-06-20 DIAGNOSIS — N185 Chronic kidney disease, stage 5: Secondary | ICD-10-CM | POA: Diagnosis not present

## 2019-06-20 DIAGNOSIS — Z975 Presence of (intrauterine) contraceptive device: Secondary | ICD-10-CM | POA: Diagnosis not present

## 2019-06-20 DIAGNOSIS — E669 Obesity, unspecified: Secondary | ICD-10-CM | POA: Diagnosis not present

## 2019-06-20 DIAGNOSIS — Z4781 Encounter for orthopedic aftercare following surgical amputation: Secondary | ICD-10-CM | POA: Diagnosis not present

## 2019-06-20 DIAGNOSIS — E7800 Pure hypercholesterolemia, unspecified: Secondary | ICD-10-CM | POA: Diagnosis not present

## 2019-06-20 DIAGNOSIS — Z89511 Acquired absence of right leg below knee: Secondary | ICD-10-CM | POA: Diagnosis not present

## 2019-06-20 DIAGNOSIS — I120 Hypertensive chronic kidney disease with stage 5 chronic kidney disease or end stage renal disease: Secondary | ICD-10-CM | POA: Diagnosis not present

## 2019-06-20 DIAGNOSIS — Z794 Long term (current) use of insulin: Secondary | ICD-10-CM | POA: Diagnosis not present

## 2019-06-20 DIAGNOSIS — Z6841 Body Mass Index (BMI) 40.0 and over, adult: Secondary | ICD-10-CM | POA: Diagnosis not present

## 2019-06-20 DIAGNOSIS — Z9181 History of falling: Secondary | ICD-10-CM | POA: Diagnosis not present

## 2019-06-20 DIAGNOSIS — E1122 Type 2 diabetes mellitus with diabetic chronic kidney disease: Secondary | ICD-10-CM | POA: Diagnosis not present

## 2019-06-20 DIAGNOSIS — E1151 Type 2 diabetes mellitus with diabetic peripheral angiopathy without gangrene: Secondary | ICD-10-CM | POA: Diagnosis not present

## 2019-06-20 DIAGNOSIS — G4733 Obstructive sleep apnea (adult) (pediatric): Secondary | ICD-10-CM | POA: Diagnosis not present

## 2019-06-20 DIAGNOSIS — D631 Anemia in chronic kidney disease: Secondary | ICD-10-CM | POA: Diagnosis not present

## 2019-06-26 DIAGNOSIS — Z4781 Encounter for orthopedic aftercare following surgical amputation: Secondary | ICD-10-CM | POA: Diagnosis not present

## 2019-06-26 DIAGNOSIS — I120 Hypertensive chronic kidney disease with stage 5 chronic kidney disease or end stage renal disease: Secondary | ICD-10-CM | POA: Diagnosis not present

## 2019-06-26 DIAGNOSIS — E669 Obesity, unspecified: Secondary | ICD-10-CM | POA: Diagnosis not present

## 2019-06-26 DIAGNOSIS — E7800 Pure hypercholesterolemia, unspecified: Secondary | ICD-10-CM | POA: Diagnosis not present

## 2019-06-26 DIAGNOSIS — E1151 Type 2 diabetes mellitus with diabetic peripheral angiopathy without gangrene: Secondary | ICD-10-CM | POA: Diagnosis not present

## 2019-06-26 DIAGNOSIS — D631 Anemia in chronic kidney disease: Secondary | ICD-10-CM | POA: Diagnosis not present

## 2019-06-26 DIAGNOSIS — Z975 Presence of (intrauterine) contraceptive device: Secondary | ICD-10-CM | POA: Diagnosis not present

## 2019-06-26 DIAGNOSIS — N185 Chronic kidney disease, stage 5: Secondary | ICD-10-CM | POA: Diagnosis not present

## 2019-06-26 DIAGNOSIS — E1122 Type 2 diabetes mellitus with diabetic chronic kidney disease: Secondary | ICD-10-CM | POA: Diagnosis not present

## 2019-06-26 DIAGNOSIS — Z9181 History of falling: Secondary | ICD-10-CM | POA: Diagnosis not present

## 2019-06-26 DIAGNOSIS — G4733 Obstructive sleep apnea (adult) (pediatric): Secondary | ICD-10-CM | POA: Diagnosis not present

## 2019-06-26 DIAGNOSIS — Z89511 Acquired absence of right leg below knee: Secondary | ICD-10-CM | POA: Diagnosis not present

## 2019-06-26 DIAGNOSIS — Z794 Long term (current) use of insulin: Secondary | ICD-10-CM | POA: Diagnosis not present

## 2019-06-26 DIAGNOSIS — Z6841 Body Mass Index (BMI) 40.0 and over, adult: Secondary | ICD-10-CM | POA: Diagnosis not present

## 2019-07-03 DIAGNOSIS — Z89511 Acquired absence of right leg below knee: Secondary | ICD-10-CM | POA: Diagnosis not present

## 2019-07-04 DIAGNOSIS — Z4781 Encounter for orthopedic aftercare following surgical amputation: Secondary | ICD-10-CM | POA: Diagnosis not present

## 2019-07-04 DIAGNOSIS — I120 Hypertensive chronic kidney disease with stage 5 chronic kidney disease or end stage renal disease: Secondary | ICD-10-CM | POA: Diagnosis not present

## 2019-07-04 DIAGNOSIS — Z975 Presence of (intrauterine) contraceptive device: Secondary | ICD-10-CM | POA: Diagnosis not present

## 2019-07-04 DIAGNOSIS — Z794 Long term (current) use of insulin: Secondary | ICD-10-CM | POA: Diagnosis not present

## 2019-07-04 DIAGNOSIS — E7800 Pure hypercholesterolemia, unspecified: Secondary | ICD-10-CM | POA: Diagnosis not present

## 2019-07-04 DIAGNOSIS — Z89511 Acquired absence of right leg below knee: Secondary | ICD-10-CM | POA: Diagnosis not present

## 2019-07-04 DIAGNOSIS — E669 Obesity, unspecified: Secondary | ICD-10-CM | POA: Diagnosis not present

## 2019-07-04 DIAGNOSIS — N185 Chronic kidney disease, stage 5: Secondary | ICD-10-CM | POA: Diagnosis not present

## 2019-07-04 DIAGNOSIS — E1151 Type 2 diabetes mellitus with diabetic peripheral angiopathy without gangrene: Secondary | ICD-10-CM | POA: Diagnosis not present

## 2019-07-04 DIAGNOSIS — D631 Anemia in chronic kidney disease: Secondary | ICD-10-CM | POA: Diagnosis not present

## 2019-07-04 DIAGNOSIS — Z9181 History of falling: Secondary | ICD-10-CM | POA: Diagnosis not present

## 2019-07-04 DIAGNOSIS — E1122 Type 2 diabetes mellitus with diabetic chronic kidney disease: Secondary | ICD-10-CM | POA: Diagnosis not present

## 2019-07-04 DIAGNOSIS — Z6841 Body Mass Index (BMI) 40.0 and over, adult: Secondary | ICD-10-CM | POA: Diagnosis not present

## 2019-07-04 DIAGNOSIS — G4733 Obstructive sleep apnea (adult) (pediatric): Secondary | ICD-10-CM | POA: Diagnosis not present

## 2019-07-08 DIAGNOSIS — N185 Chronic kidney disease, stage 5: Secondary | ICD-10-CM | POA: Diagnosis not present

## 2019-07-08 DIAGNOSIS — E669 Obesity, unspecified: Secondary | ICD-10-CM | POA: Diagnosis not present

## 2019-07-08 DIAGNOSIS — E1151 Type 2 diabetes mellitus with diabetic peripheral angiopathy without gangrene: Secondary | ICD-10-CM | POA: Diagnosis not present

## 2019-07-08 DIAGNOSIS — Z975 Presence of (intrauterine) contraceptive device: Secondary | ICD-10-CM | POA: Diagnosis not present

## 2019-07-08 DIAGNOSIS — Z6841 Body Mass Index (BMI) 40.0 and over, adult: Secondary | ICD-10-CM | POA: Diagnosis not present

## 2019-07-08 DIAGNOSIS — Z89511 Acquired absence of right leg below knee: Secondary | ICD-10-CM | POA: Diagnosis not present

## 2019-07-08 DIAGNOSIS — E7800 Pure hypercholesterolemia, unspecified: Secondary | ICD-10-CM | POA: Diagnosis not present

## 2019-07-08 DIAGNOSIS — E1122 Type 2 diabetes mellitus with diabetic chronic kidney disease: Secondary | ICD-10-CM | POA: Diagnosis not present

## 2019-07-08 DIAGNOSIS — I120 Hypertensive chronic kidney disease with stage 5 chronic kidney disease or end stage renal disease: Secondary | ICD-10-CM | POA: Diagnosis not present

## 2019-07-08 DIAGNOSIS — G4733 Obstructive sleep apnea (adult) (pediatric): Secondary | ICD-10-CM | POA: Diagnosis not present

## 2019-07-08 DIAGNOSIS — Z9181 History of falling: Secondary | ICD-10-CM | POA: Diagnosis not present

## 2019-07-08 DIAGNOSIS — Z4781 Encounter for orthopedic aftercare following surgical amputation: Secondary | ICD-10-CM | POA: Diagnosis not present

## 2019-07-08 DIAGNOSIS — Z794 Long term (current) use of insulin: Secondary | ICD-10-CM | POA: Diagnosis not present

## 2019-07-08 DIAGNOSIS — D631 Anemia in chronic kidney disease: Secondary | ICD-10-CM | POA: Diagnosis not present

## 2019-07-09 DIAGNOSIS — Z89511 Acquired absence of right leg below knee: Secondary | ICD-10-CM | POA: Diagnosis not present

## 2019-07-09 DIAGNOSIS — E7800 Pure hypercholesterolemia, unspecified: Secondary | ICD-10-CM | POA: Diagnosis not present

## 2019-07-09 DIAGNOSIS — E669 Obesity, unspecified: Secondary | ICD-10-CM | POA: Diagnosis not present

## 2019-07-09 DIAGNOSIS — E1122 Type 2 diabetes mellitus with diabetic chronic kidney disease: Secondary | ICD-10-CM | POA: Diagnosis not present

## 2019-07-09 DIAGNOSIS — Z975 Presence of (intrauterine) contraceptive device: Secondary | ICD-10-CM | POA: Diagnosis not present

## 2019-07-09 DIAGNOSIS — G4733 Obstructive sleep apnea (adult) (pediatric): Secondary | ICD-10-CM | POA: Diagnosis not present

## 2019-07-09 DIAGNOSIS — I120 Hypertensive chronic kidney disease with stage 5 chronic kidney disease or end stage renal disease: Secondary | ICD-10-CM | POA: Diagnosis not present

## 2019-07-09 DIAGNOSIS — Z6841 Body Mass Index (BMI) 40.0 and over, adult: Secondary | ICD-10-CM | POA: Diagnosis not present

## 2019-07-09 DIAGNOSIS — Z9181 History of falling: Secondary | ICD-10-CM | POA: Diagnosis not present

## 2019-07-09 DIAGNOSIS — E1151 Type 2 diabetes mellitus with diabetic peripheral angiopathy without gangrene: Secondary | ICD-10-CM | POA: Diagnosis not present

## 2019-07-09 DIAGNOSIS — D631 Anemia in chronic kidney disease: Secondary | ICD-10-CM | POA: Diagnosis not present

## 2019-07-09 DIAGNOSIS — Z4781 Encounter for orthopedic aftercare following surgical amputation: Secondary | ICD-10-CM | POA: Diagnosis not present

## 2019-07-09 DIAGNOSIS — N185 Chronic kidney disease, stage 5: Secondary | ICD-10-CM | POA: Diagnosis not present

## 2019-07-09 DIAGNOSIS — Z794 Long term (current) use of insulin: Secondary | ICD-10-CM | POA: Diagnosis not present

## 2019-07-21 DIAGNOSIS — N185 Chronic kidney disease, stage 5: Secondary | ICD-10-CM | POA: Diagnosis not present

## 2019-07-28 ENCOUNTER — Ambulatory Visit (INDEPENDENT_AMBULATORY_CARE_PROVIDER_SITE_OTHER): Payer: Federal, State, Local not specified - PPO | Admitting: Internal Medicine

## 2019-07-28 ENCOUNTER — Other Ambulatory Visit: Payer: Self-pay

## 2019-07-28 ENCOUNTER — Encounter: Payer: Self-pay | Admitting: Internal Medicine

## 2019-07-28 VITALS — BP 146/72 | HR 73 | Temp 97.4°F | Ht 69.0 in | Wt 254.0 lb

## 2019-07-28 DIAGNOSIS — G4733 Obstructive sleep apnea (adult) (pediatric): Secondary | ICD-10-CM | POA: Diagnosis not present

## 2019-07-28 DIAGNOSIS — E118 Type 2 diabetes mellitus with unspecified complications: Secondary | ICD-10-CM | POA: Diagnosis not present

## 2019-07-28 DIAGNOSIS — Z89511 Acquired absence of right leg below knee: Secondary | ICD-10-CM

## 2019-07-28 DIAGNOSIS — N185 Chronic kidney disease, stage 5: Secondary | ICD-10-CM | POA: Diagnosis not present

## 2019-07-28 DIAGNOSIS — I10 Essential (primary) hypertension: Secondary | ICD-10-CM

## 2019-07-28 NOTE — Progress Notes (Signed)
Date:  07/28/2019   Name:  Cindy Osborne   DOB:  10/20/1965   MRN:  [id number]   Chief Complaint: Leg Amputation (Follow up after surgery. Having high BP. ), Hypertension, and Work Form (Form to be able to stay out of work - form needed filling out. Surgeon took last stitches out today and she will have to follow up with PCP for future appts as needed. )  Hypertension This is a chronic problem. The problem is resistant. Pertinent negatives include no chest pain, headaches, palpitations or shortness of breath. Past treatments include beta blockers, diuretics, calcium channel blockers and ACE inhibitors. The current treatment provides moderate improvement.  Diabetes She presents for her follow-up diabetic visit. She has type 2 diabetes mellitus. Her disease course has been fluctuating. Pertinent negatives for hypoglycemia include no dizziness or headaches. Pertinent negatives for diabetes include no chest pain and no fatigue. Current diabetic treatment includes intensive insulin program. She is compliant with treatment most of the time (followed by endocrinology).  BKA Right - she just had final stitches removed today and is discharged from surgical follow up.  She has been measured for a prosthesis with [employer] in [place].   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  She does anticipate return to work - is working on early retirement but needs a form completed to continue to draw STD.  Lab Results  Component Value Date   CREATININE 7.1 (A) 02/17/2019   BUN 76 (A) 02/17/2019   NA 136 (A) 02/17/2019   K 4.9 02/17/2019   Lab Results  Component Value Date   CHOL 155 02/24/2019   HDL 33 (L) 02/24/2019   LDLCALC 94 02/24/2019   TRIG 139 02/24/2019   CHOLHDL 4.7 (H) 02/24/2019   Lab Results  Component Value Date   TSH 1.600 02/24/2019   Lab Results  Component Value Date   HGBA1C 7.6 02/17/2019     Review of Systems  Constitutional: Negative for chills, fatigue  and fever.  Respiratory: Negative for cough, shortness of breath and wheezing.   Cardiovascular: Negative for chest pain, palpitations and leg swelling.  Neurological: Negative for dizziness and headaches.  Psychiatric/Behavioral: Positive for sleep disturbance (not using CPAP nightly - often falls asleep without it.).    Patient Active Problem List   Diagnosis Date Noted  . Acquired absence of right leg below knee ([place]) 07/28/2019  . Diabetic retinopathy ([place]) 11/23/2016  . Uncontrolled diabetes mellitus with stage 5 chronic kidney disease ([place]) 09/24/2015  . Slow transit constipation 09/24/2015  . Environmental and seasonal allergies 07/09/2015  . Anemia associated with chronic renal failure 07/09/2015  . CKD stage 5 due to type 2 diabetes mellitus ([place]) 07/09/2015  . Uncontrolled type 2 diabetes mellitus with diabetic peripheral angiopathy without gangrene ([place]) 07/09/2015  . Herpes simplex infection 07/09/2015  . Hyperlipidemia associated with type 2 diabetes mellitus ([place]) 07/09/2015  . Heart & renal disease, hypertensive, with heart failure ([place]) 07/09/2015  . Arthritis of knee, degenerative 07/09/2015  . Tendinitis 07/09/2015  . Avitaminosis D 07/09/2015  . Body mass index (BMI) of 40.0-44.9 in adult ([employer]) 06/07/2015  . Obstructive apnea 12/15/2014  . History of diabetic ulcer of foot 04/01/2014  . Intracervical pessary 01/07/2014  . Hypertension 09/11/2013  . Amputated toe ([place]) 01/31/2012    Allergies  Allergen Reactions  . Atorvastatin     Other reaction(s): Other (See Comments) Back pain  . Peanut-Containing Drug Products Itching  . Pioglitazone  Other reaction(s): Edema  . Rosuvastatin Itching  . Shellfish-Derived Products Rash    shrimp  . Sulfa Antibiotics Rash    Past Surgical History:  Procedure Laterality Date  . avg for dialysis    . CHOLECYSTECTOMY    . COLONOSCOPY  08/15/2011   cleared for 10 yrs  . LEG AMPUTATION BELOW KNEE Right 04/2019  .  TOE AMPUTATION Right 10/15/2018    Social History   Tobacco Use  . Smoking status: Never Smoker  . Smokeless tobacco: Never Used  Substance Use Topics  . Alcohol use: Yes    Alcohol/week: 0.0 standard drinks  . Drug use: No     Medication list has been reviewed and updated.  Current Meds  Medication Sig  . amLODipine (NORVASC) 10 MG tablet Take 1 tablet by mouth daily.  . benazepril (LOTENSIN) 40 MG tablet   . ezetimibe (ZETIA) 10 MG tablet Take 10 mg by mouth daily.   . hydrochlorothiazide (HYDRODIURIL) 50 MG tablet Take 2 tablets by mouth daily.   . insulin aspart (NOVOLOG FLEXPEN) 100 UNIT/ML FlexPen Inject 5 Units into the skin 3 (three) times daily before meals.  . Insulin Glargine (LANTUS SOLOSTAR) 100 UNIT/ML Solostar Pen Inject 22 Units into the skin daily.  Marland Kitchen levonorgestrel (MIRENA, 52 MG,) 20 MCG/24HR IUD by Intrauterine route.  . metoprolol succinate (TOPROL-XL) 100 MG 24 hr tablet Take 1.5 tablets by mouth daily.  Marland Kitchen senna-docusate (SENOKOT-S) 8.6-50 MG tablet Take by mouth.  . sodium bicarbonate 650 MG tablet Take by mouth 3 (three) times daily.    PHQ 2/9 Scores 07/28/2019 02/24/2019 09/26/2017 01/12/2016  PHQ - 2 Score 0 0 0 0  PHQ- 9 Score 1 0 0 -    BP Readings from Last 3 Encounters:  07/28/19 (!) 146/72  02/24/19 116/74  09/26/17 112/68    Physical Exam Vitals and nursing note reviewed.  Constitutional:      General: She is not in acute distress.    Appearance: She is well-developed.  HENT:     Head: Normocephalic and atraumatic.  Cardiovascular:     Rate and Rhythm: Normal rate and regular rhythm.     Heart sounds: Normal heart sounds. No murmur.  Pulmonary:     Effort: Pulmonary effort is normal. No respiratory distress.     Breath sounds: Normal breath sounds and air entry. No wheezing or rhonchi.  Musculoskeletal:        General: Normal range of motion.     Right lower leg: No edema.     Left lower leg: No edema.     Right Lower Extremity:  Right leg is amputated below knee.  Skin:    General: Skin is warm and dry.     Findings: No rash.  Neurological:     Mental Status: She is alert and oriented to person, place, and time.  Psychiatric:        Attention and Perception: Attention normal.        Mood and Affect: Mood normal.        Speech: Speech normal.        Behavior: Behavior normal.        Thought Content: Thought content normal.     Wt Readings from Last 3 Encounters:  07/28/19 254 lb (115.2 kg)  02/24/19 271 lb (122.9 kg)  09/26/17 282 lb (127.9 kg)    BP (!) 146/72   Pulse 73   Temp (!) 97.4 F (36.3 C) (Temporal)   Ht 5'  9" (1.753 m)   Wt 254 lb (115.2 kg)   SpO2 99%   BMI 37.51 kg/m   Assessment and Plan: 1. Essential hypertension Fair control - recommend continuing 4 agents and follow up later today with Nephrology as planned May also improve with more regular use of CPAP  2. Obstructive apnea Recommend using CPAP nightly - set an alarm so that she does not sleep long without putting the equipment on  3. Type II diabetes mellitus with complication (HCC) Continue intensive insulin therapy and follow up with Endocrinology  4. Acquired absence of right leg below knee ([place]) Healing well Already established with Therapist, music Form for STD completed today and faxed   Partially dictated using Editor, commissioning. Any errors are unintentional.  Halina Maidens, MD [facility]  07/28/2019

## 2019-07-30 DIAGNOSIS — D638 Anemia in other chronic diseases classified elsewhere: Secondary | ICD-10-CM | POA: Diagnosis not present

## 2019-07-30 DIAGNOSIS — N185 Chronic kidney disease, stage 5: Secondary | ICD-10-CM | POA: Diagnosis not present

## 2019-07-30 DIAGNOSIS — E1121 Type 2 diabetes mellitus with diabetic nephropathy: Secondary | ICD-10-CM | POA: Diagnosis not present

## 2019-07-30 DIAGNOSIS — I10 Essential (primary) hypertension: Secondary | ICD-10-CM | POA: Diagnosis not present

## 2019-08-06 ENCOUNTER — Ambulatory Visit: Payer: Federal, State, Local not specified - PPO | Admitting: Internal Medicine

## 2019-08-14 DIAGNOSIS — Z89511 Acquired absence of right leg below knee: Secondary | ICD-10-CM | POA: Diagnosis not present

## 2019-08-20 ENCOUNTER — Ambulatory Visit (INDEPENDENT_AMBULATORY_CARE_PROVIDER_SITE_OTHER): Payer: Federal, State, Local not specified - PPO | Admitting: Internal Medicine

## 2019-08-20 ENCOUNTER — Encounter: Payer: Self-pay | Admitting: Internal Medicine

## 2019-08-20 ENCOUNTER — Telehealth: Payer: Self-pay

## 2019-08-20 ENCOUNTER — Other Ambulatory Visit: Payer: Self-pay

## 2019-08-20 VITALS — BP 148/88 | Temp 98.2°F | Ht 69.0 in | Wt 254.0 lb

## 2019-08-20 DIAGNOSIS — J3089 Other allergic rhinitis: Secondary | ICD-10-CM

## 2019-08-20 DIAGNOSIS — R05 Cough: Secondary | ICD-10-CM

## 2019-08-20 DIAGNOSIS — R059 Cough, unspecified: Secondary | ICD-10-CM

## 2019-08-20 DIAGNOSIS — Z89511 Acquired absence of right leg below knee: Secondary | ICD-10-CM

## 2019-08-20 MED ORDER — HYDROCODONE-HOMATROPINE 5-1.5 MG/5ML PO SYRP: 5.0000 mL | ORAL_SOLUTION | Freq: Four times a day (QID) | ORAL | 0 refills | Status: AC | PRN

## 2019-08-20 NOTE — Telephone Encounter (Signed)
Please call pt and schedule VV for cough and drainage today at 340 PM. She needs to take her tempeture and BP if able before 315 PM.  Thank you.

## 2019-08-20 NOTE — Progress Notes (Signed)
Date:  08/20/2019   Name:  Cindy Osborne   DOB:  11-25-1965   MRN:  [id number]  I connected with this patient, Cindy Osborne, by telephone at the patient's home.  I verified that I am speaking with the correct person using two identifiers. This visit was conducted via telephone due to the Covid-19 outbreak from my office at [facility] in [place], [place]. I discussed the limitations, risks, security and privacy concerns of performing an evaluation and management service by telephone. I also discussed with the patient that there may be a patient responsible charge related to this service. The patient expressed understanding and agreed to proceed.  Chief Complaint: Cough (Cough with drainage in throat. Pt requesting cough syrup she said she needs yearly around this time when she gets a cold. COugh is giving her trouble while sleeping. No fever or SOB or loss of taste or smell. )  Cough This is a new problem. The current episode started in the past 7 days. The problem has been unchanged. The problem occurs every few minutes. The cough is productive of sputum (mostly clear). Associated symptoms include ear congestion, nasal congestion, postnasal drip, rhinorrhea and a sore throat. Pertinent negatives include no chest pain, chills, fever, headaches, shortness of breath or wheezing. Risk factors: similar to the cough that she gets every winter related to indoor allergies. Treatments tried: allergra. The treatment provided mild relief.   S/P BKA - she reports doing well. Just measured for a prosthesis.  She is progressing in therapy.  She is requesting a handicapped parking tag.   Lab Results  Component Value Date   CREATININE 7.1 (A) 02/17/2019   BUN 76 (A) 02/17/2019   NA 136 (A) 02/17/2019   K 4.9 02/17/2019   Lab Results  Component Value Date   CHOL 155 02/24/2019   HDL 33 (L) 02/24/2019   LDLCALC 94 02/24/2019   TRIG 139 02/24/2019   CHOLHDL 4.7 (H) 02/24/2019   Lab Results    Component Value Date   TSH 1.600 02/24/2019   Lab Results  Component Value Date   HGBA1C 7.6 02/17/2019     Review of Systems  Constitutional: Negative for chills and fever.  HENT: Positive for congestion, postnasal drip, rhinorrhea and sore throat.        No loss of taste or smell  Respiratory: Positive for cough. Negative for chest tightness, shortness of breath and wheezing.   Cardiovascular: Negative for chest pain and palpitations.  Gastrointestinal: Negative for diarrhea.  Neurological: Negative for dizziness and headaches.    Patient Active Problem List   Diagnosis Date Noted  . Acquired absence of right leg below knee ([place]) 07/28/2019  . Diabetic retinopathy ([place]) 11/23/2016  . Slow transit constipation 09/24/2015  . Environmental and seasonal allergies 07/09/2015  . Anemia associated with chronic renal failure 07/09/2015  . CKD stage 5 due to type 2 diabetes mellitus ([place]) 07/09/2015  . Type II diabetes mellitus with complication ([place]) [id number]  . Herpes simplex infection 07/09/2015  . Hyperlipidemia associated with type 2 diabetes mellitus ([place]) 07/09/2015  . Heart & renal disease, hypertensive, with heart failure ([place]) 07/09/2015  . Arthritis of knee, degenerative 07/09/2015  . Tendinitis 07/09/2015  . Avitaminosis D 07/09/2015  . Body mass index (BMI) of 40.0-44.9 in adult ([place]) 06/07/2015  . Obstructive apnea 12/15/2014  . History of diabetic ulcer of foot 04/01/2014  . Intracervical pessary 01/07/2014  . Hypertension 09/11/2013  . Amputated toe ([place]) 01/31/2012  Allergies  Allergen Reactions  . Atorvastatin     Other reaction(s): Other (See Comments) Back pain  . Peanut-Containing Drug Products Itching  . Pioglitazone     Other reaction(s): Edema  . Rosuvastatin Itching  . Shellfish-Derived Products Rash    shrimp  . Sulfa Antibiotics Rash    Past Surgical History:  Procedure Laterality Date  . avg for dialysis    . CHOLECYSTECTOMY    .  COLONOSCOPY  08/15/2011   cleared for 10 yrs  . LEG AMPUTATION BELOW KNEE Right 04/2019  . TOE AMPUTATION Right 10/15/2018    Social History   Tobacco Use  . Smoking status: Never Smoker  . Smokeless tobacco: Never Used  Substance Use Topics  . Alcohol use: Yes    Alcohol/week: 0.0 standard drinks  . Drug use: No     Medication list has been reviewed and updated.  Current Meds  Medication Sig  . amLODipine (NORVASC) 10 MG tablet Take 1 tablet by mouth daily.  . benazepril (LOTENSIN) 40 MG tablet   . ezetimibe (ZETIA) 10 MG tablet Take 10 mg by mouth daily.   . hydrochlorothiazide (HYDRODIURIL) 50 MG tablet Take 2 tablets by mouth daily.   . insulin aspart (NOVOLOG FLEXPEN) 100 UNIT/ML FlexPen Inject 5 Units into the skin 3 (three) times daily before meals.  . Insulin Glargine (LANTUS SOLOSTAR) 100 UNIT/ML Solostar Pen Inject 22 Units into the skin daily.  Marland Kitchen levonorgestrel (MIRENA, 52 MG,) 20 MCG/24HR IUD by Intrauterine route.  . metoprolol succinate (TOPROL-XL) 100 MG 24 hr tablet Take 1.5 tablets by mouth daily.  Marland Kitchen senna-docusate (SENOKOT-S) 8.6-50 MG tablet Take by mouth.  . sodium bicarbonate 650 MG tablet Take by mouth 3 (three) times daily.    PHQ 2/9 Scores 08/20/2019 07/28/2019 02/24/2019 09/26/2017  PHQ - 2 Score 0 0 0 0  PHQ- 9 Score 0 1 0 0    BP Readings from Last 3 Encounters:  08/20/19 (!) 148/88  07/28/19 (!) 146/72  02/24/19 116/74    Physical Exam Pulmonary:     Comments: No cough or dyspnea noted during the call Neurological:     Mental Status: She is alert.  Psychiatric:        Attention and Perception: Attention normal.        Mood and Affect: Mood normal.        Speech: Speech normal.        Cognition and Memory: Cognition normal.     Wt Readings from Last 3 Encounters:  08/20/19 254 lb (115.2 kg)  07/28/19 254 lb (115.2 kg)  02/24/19 271 lb (122.9 kg)    BP (!) 148/88   Temp 98.2 F (36.8 C) (Oral)   Ht 5\' 9"  (1.753 m)   Wt 254 lb  (115.2 kg)   BMI 37.51 kg/m   Assessment and Plan: 1. Cough Due to post nasal drainage and allergies Doubt viral or bacterial so will defer antibiotics and Covid testing at this time - HYDROcodone-homatropine (HYCODAN) 5-1.5 MG/5ML syrup; Take 5 mLs by mouth every 6 (six) hours as needed for up to 10 days for cough.  Dispense: 120 mL; Refill: 0  2. Environmental and seasonal allergies Continue Allegra daily  3. Acquired absence of right leg below knee [facility]) Will mail her a handicapped parking placard application   Partially dictated using Editor, commissioning. Any errors are unintentional.  Halina Maidens, MD [facility]  08/20/2019

## 2019-08-26 DIAGNOSIS — N185 Chronic kidney disease, stage 5: Secondary | ICD-10-CM | POA: Diagnosis not present

## 2019-09-01 ENCOUNTER — Other Ambulatory Visit: Payer: Self-pay

## 2019-09-01 DIAGNOSIS — Z89511 Acquired absence of right leg below knee: Secondary | ICD-10-CM

## 2019-09-04 DIAGNOSIS — Z89511 Acquired absence of right leg below knee: Secondary | ICD-10-CM | POA: Diagnosis not present

## 2019-09-04 DIAGNOSIS — R269 Unspecified abnormalities of gait and mobility: Secondary | ICD-10-CM | POA: Diagnosis not present

## 2019-09-08 DIAGNOSIS — E7800 Pure hypercholesterolemia, unspecified: Secondary | ICD-10-CM | POA: Diagnosis not present

## 2019-09-08 DIAGNOSIS — N185 Chronic kidney disease, stage 5: Secondary | ICD-10-CM | POA: Diagnosis not present

## 2019-09-08 DIAGNOSIS — E1121 Type 2 diabetes mellitus with diabetic nephropathy: Secondary | ICD-10-CM | POA: Diagnosis not present

## 2019-09-08 DIAGNOSIS — I10 Essential (primary) hypertension: Secondary | ICD-10-CM | POA: Diagnosis not present

## 2019-09-09 LAB — HM DIABETES EYE EXAM

## 2019-09-11 DIAGNOSIS — L603 Nail dystrophy: Secondary | ICD-10-CM | POA: Diagnosis not present

## 2019-09-11 DIAGNOSIS — E1151 Type 2 diabetes mellitus with diabetic peripheral angiopathy without gangrene: Secondary | ICD-10-CM | POA: Diagnosis not present

## 2019-09-11 DIAGNOSIS — Z89511 Acquired absence of right leg below knee: Secondary | ICD-10-CM | POA: Diagnosis not present

## 2019-09-11 DIAGNOSIS — L851 Acquired keratosis [keratoderma] palmaris et plantaris: Secondary | ICD-10-CM | POA: Diagnosis not present

## 2019-10-06 DIAGNOSIS — N185 Chronic kidney disease, stage 5: Secondary | ICD-10-CM | POA: Diagnosis not present

## 2019-10-09 DIAGNOSIS — I10 Essential (primary) hypertension: Secondary | ICD-10-CM | POA: Diagnosis not present

## 2019-10-09 DIAGNOSIS — N185 Chronic kidney disease, stage 5: Secondary | ICD-10-CM | POA: Diagnosis not present

## 2019-10-09 DIAGNOSIS — Z23 Encounter for immunization: Secondary | ICD-10-CM | POA: Diagnosis not present

## 2019-10-09 DIAGNOSIS — E1121 Type 2 diabetes mellitus with diabetic nephropathy: Secondary | ICD-10-CM | POA: Diagnosis not present

## 2019-10-09 DIAGNOSIS — D638 Anemia in other chronic diseases classified elsewhere: Secondary | ICD-10-CM | POA: Diagnosis not present

## 2019-10-22 ENCOUNTER — Other Ambulatory Visit: Payer: Self-pay | Admitting: Internal Medicine

## 2019-10-22 ENCOUNTER — Telehealth: Payer: Self-pay

## 2019-10-22 DIAGNOSIS — J3089 Other allergic rhinitis: Secondary | ICD-10-CM

## 2019-10-22 MED ORDER — BENZONATATE 100 MG PO CAPS: 100.0000 mg | ORAL_CAPSULE | Freq: Two times a day (BID) | ORAL | 1 refills | Status: DC | PRN

## 2019-10-22 NOTE — Telephone Encounter (Signed)
Incoming voicemail from patient requesting a prescription for cough syrup.

## 2019-10-22 NOTE — Telephone Encounter (Signed)
Patient notified

## 2019-10-22 NOTE — Telephone Encounter (Signed)
I can not give her controlled/narcotic cough syrup.  I sent in [place] for her to use for cough.

## 2019-10-29 DIAGNOSIS — E1151 Type 2 diabetes mellitus with diabetic peripheral angiopathy without gangrene: Secondary | ICD-10-CM | POA: Diagnosis not present

## 2019-10-29 DIAGNOSIS — L603 Nail dystrophy: Secondary | ICD-10-CM | POA: Diagnosis not present

## 2019-10-29 DIAGNOSIS — Z89511 Acquired absence of right leg below knee: Secondary | ICD-10-CM | POA: Diagnosis not present

## 2019-11-19 ENCOUNTER — Ambulatory Visit (INDEPENDENT_AMBULATORY_CARE_PROVIDER_SITE_OTHER): Payer: Federal, State, Local not specified - PPO | Admitting: Internal Medicine

## 2019-11-19 ENCOUNTER — Other Ambulatory Visit: Payer: Self-pay

## 2019-11-19 ENCOUNTER — Encounter: Payer: Self-pay | Admitting: Internal Medicine

## 2019-11-19 VITALS — BP 132/78 | HR 79 | Ht 69.0 in | Wt 277.0 lb

## 2019-11-19 DIAGNOSIS — J3089 Other allergic rhinitis: Secondary | ICD-10-CM | POA: Diagnosis not present

## 2019-11-19 DIAGNOSIS — Z6841 Body Mass Index (BMI) 40.0 and over, adult: Secondary | ICD-10-CM

## 2019-11-19 DIAGNOSIS — I10 Essential (primary) hypertension: Secondary | ICD-10-CM

## 2019-11-19 DIAGNOSIS — E118 Type 2 diabetes mellitus with unspecified complications: Secondary | ICD-10-CM

## 2019-11-19 DIAGNOSIS — L308 Other specified dermatitis: Secondary | ICD-10-CM | POA: Diagnosis not present

## 2019-11-19 DIAGNOSIS — E1122 Type 2 diabetes mellitus with diabetic chronic kidney disease: Secondary | ICD-10-CM

## 2019-11-19 DIAGNOSIS — N185 Chronic kidney disease, stage 5: Secondary | ICD-10-CM

## 2019-11-19 MED ORDER — MONTELUKAST SODIUM 10 MG PO TABS: 10.0000 mg | ORAL_TABLET | Freq: Every day | ORAL | 3 refills | Status: DC

## 2019-11-19 MED ORDER — FLUTICASONE PROPIONATE 50 MCG/ACT NA SUSP: 2.0000 | Freq: Every day | NASAL | 6 refills | Status: DC

## 2019-11-19 MED ORDER — TRIAMCINOLONE ACETONIDE 0.1 % EX CREA: 1.0000 "application " | TOPICAL_CREAM | Freq: Two times a day (BID) | CUTANEOUS | 1 refills | Status: DC

## 2019-11-19 NOTE — Progress Notes (Signed)
Date:  11/19/2019   Name:  Cindy Osborne   DOB:  Feb 24, 1966   MRN:  [id number]   Chief Complaint: Hypertension (follow up.), Allergies (Face swelling- pearls does not help with cough. ), and Eczema (Right leg- dry spots. Started a month ago after starting to wear prostetic. )  Hypertension This is a chronic problem. The problem is controlled. Pertinent negatives include no chest pain, headaches, palpitations or shortness of breath. Past treatments include beta blockers, ACE inhibitors, diuretics and calcium channel blockers. The current treatment provides significant improvement.  Diabetes She presents for her follow-up diabetic visit. She has type 2 diabetes mellitus. Her disease course has been stable. Pertinent negatives for hypoglycemia include no dizziness, headaches or tremors. Pertinent negatives for diabetes include no chest pain, no fatigue, no polydipsia and no polyuria. Current diabetic treatment includes insulin injections. She monitors blood glucose at home 1-2 x per day. Her breakfast blood glucose is taken between 6-7 am. Her breakfast blood glucose range is generally 130-140 mg/dl. An ACE inhibitor/angiotensin II receptor blocker is being taken.  Rash This is a new problem. The problem is unchanged. The affected locations include the right lower leg (lower leg on right under the prosthetic cap). The rash is characterized by itchiness, scaling and peeling. Associated symptoms include congestion and coughing. Pertinent negatives include no fatigue, fever, shortness of breath or sore throat. Past treatments include moisturizer. The treatment provided no relief.  CKD - renal function is stable - she continues to follow up with Neph and will have labs next month along with A1C. Allergies -  Runny nose, watery eyes, sneezing and coughing. Using Allegra daily.  Using Flonase sometimes.  Lab Results  Component Value Date   CREATININE 7.1 (A) 02/17/2019   BUN 76 (A) 02/17/2019   NA  136 (A) 02/17/2019   K 4.9 02/17/2019   Lab Results  Component Value Date   CHOL 155 02/24/2019   HDL 33 (L) 02/24/2019   LDLCALC 94 02/24/2019   TRIG 139 02/24/2019   CHOLHDL 4.7 (H) 02/24/2019   Lab Results  Component Value Date   TSH 1.600 02/24/2019   Lab Results  Component Value Date   HGBA1C 7.6 02/17/2019   Lab Results  Component Value Date   WBC 8.3 02/17/2019   HGB 9.6 (A) 02/17/2019   HCT 29 (A) 02/17/2019   PLT 227 02/17/2019   Lab Results  Component Value Date   ALT 9 02/24/2019   AST 9 02/24/2019   ALKPHOS 80 02/24/2019   BILITOT 0.3 02/24/2019     Review of Systems  Constitutional: Negative for appetite change, fatigue, fever and unexpected weight change.  HENT: Positive for congestion, postnasal drip and sneezing. Negative for sinus pressure, sore throat, tinnitus and trouble swallowing.   Eyes: Negative for visual disturbance.  Respiratory: Positive for cough. Negative for chest tightness, shortness of breath and wheezing.   Cardiovascular: Negative for chest pain, palpitations and leg swelling.  Gastrointestinal: Negative for abdominal pain.  Endocrine: Negative for polydipsia and polyuria.  Genitourinary: Negative for dysuria and hematuria.  Musculoskeletal: Positive for gait problem (now ambultating with a cane on her prosthesis). Negative for arthralgias.  Skin: Positive for rash.  Neurological: Negative for dizziness, tremors, numbness and headaches.  Psychiatric/Behavioral: Negative for dysphoric mood.    Patient Active Problem List   Diagnosis Date Noted  . Acquired absence of right leg below knee ([place]) 07/28/2019  . Diabetic retinopathy ([place]) 11/23/2016  . Slow transit constipation 09/24/2015  .  Environmental and seasonal allergies 07/09/2015  . Anemia associated with chronic renal failure 07/09/2015  . CKD stage 5 due to type 2 diabetes mellitus ([place]) 07/09/2015  . Type II diabetes mellitus with complication ([place]) 83/15/1761  .  Herpes simplex infection 07/09/2015  . Hyperlipidemia associated with type 2 diabetes mellitus ([place]) 07/09/2015  . Heart & renal disease, hypertensive, with heart failure ([place]) 07/09/2015  . Arthritis of knee, degenerative 07/09/2015  . Tendinitis 07/09/2015  . Avitaminosis D 07/09/2015  . Body mass index (BMI) of 40.0-44.9 in adult ([place]) 06/07/2015  . Obstructive apnea 12/15/2014  . History of diabetic ulcer of foot 04/01/2014  . Intracervical pessary 01/07/2014  . Hypertension 09/11/2013  . Amputated toe ([place]) 01/31/2012    Allergies  Allergen Reactions  . Atorvastatin     Other reaction(s): Other (See Comments) Back pain  . Peanut-Containing Drug Products Itching  . Pioglitazone     Other reaction(s): Edema  . Rosuvastatin Itching  . Shellfish-Derived Products Rash    shrimp  . Sulfa Antibiotics Rash    Past Surgical History:  Procedure Laterality Date  . avg for dialysis    . CHOLECYSTECTOMY    . COLONOSCOPY  08/15/2011   cleared for 10 yrs  . LEG AMPUTATION BELOW KNEE Right 04/2019  . TOE AMPUTATION Right 10/15/2018    Social History   Tobacco Use  . Smoking status: Never Smoker  . Smokeless tobacco: Never Used  Substance Use Topics  . Alcohol use: Yes    Alcohol/week: 0.0 standard drinks  . Drug use: No     Medication list has been reviewed and updated.  Current Meds  Medication Sig  . amLODipine (NORVASC) 10 MG tablet Take 1 tablet by mouth daily.  . benazepril (LOTENSIN) 40 MG tablet   . ezetimibe (ZETIA) 10 MG tablet Take 10 mg by mouth daily.   . ferric citrate (AURYXIA) 1 GM 210 MG(Fe) tablet 1 tablet at breakfast, 2 at lunch, and 1 dinner  . hydrochlorothiazide (HYDRODIURIL) 50 MG tablet Take 2 tablets by mouth daily.   . Insulin Glargine (LANTUS SOLOSTAR) 100 UNIT/ML Solostar Pen Inject 22 Units into the skin daily.  Marland Kitchen levonorgestrel (MIRENA, 52 MG,) 20 MCG/24HR IUD by Intrauterine route.  . metoprolol succinate (TOPROL-XL) 100 MG 24 hr tablet  Take 1.5 tablets by mouth daily.  Marland Kitchen senna-docusate (SENOKOT-S) 8.6-50 MG tablet Take by mouth.  . sodium bicarbonate 650 MG tablet Take by mouth 3 (three) times daily.    PHQ 2/9 Scores 08/20/2019 07/28/2019 02/24/2019 09/26/2017  PHQ - 2 Score 0 0 0 0  PHQ- 9 Score 0 1 0 0    BP Readings from Last 3 Encounters:  11/19/19 132/78  08/20/19 (!) 148/88  07/28/19 (!) 146/72    Physical Exam Vitals and nursing note reviewed.  Constitutional:      General: She is not in acute distress.    Appearance: She is well-developed.  HENT:     Head: Normocephalic and atraumatic.  Cardiovascular:     Rate and Rhythm: Normal rate and regular rhythm.     Pulses: Normal pulses.  Pulmonary:     Effort: Pulmonary effort is normal. No respiratory distress.  Musculoskeletal:     Cervical back: Normal range of motion and neck supple.     Left lower leg: No edema.     Right Lower Extremity: Right leg is amputated below knee.  Skin:    General: Skin is warm and dry.     Findings:  Rash present. Rash is scaling.     Comments: On right lower leg  Neurological:     Mental Status: She is alert and oriented to person, place, and time.  Psychiatric:        Attention and Perception: Attention normal.        Mood and Affect: Mood normal.        Speech: Speech normal.        Behavior: Behavior normal.     Wt Readings from Last 3 Encounters:  11/19/19 277 lb (125.6 kg)  08/20/19 254 lb (115.2 kg)  07/28/19 254 lb (115.2 kg)    BP 132/78   Pulse 79   Ht 5\' 9"  (1.753 m)   Wt 277 lb (125.6 kg)   SpO2 100%   BMI 40.91 kg/m   Assessment and Plan: 1. Essential hypertension Clinically stable exam with well controlled BP on multiple medications. Tolerating medications without side effects at this time. Pt to continue current regimen and low sodium diet; benefits of regular exercise as able discussed.  2. Environmental and seasonal allergies Continue Allegra - start Flonase daily and add Singulair  daily - montelukast (SINGULAIR) 10 MG tablet; Take 1 tablet (10 mg total) by mouth at bedtime.  Dispense: 90 tablet; Refill: 3 - fluticasone (FLONASE) 50 MCG/ACT nasal spray; Place 2 sprays into both nostrils daily.  Dispense: 16 g; Refill: 6  3. Other eczema Use bid to rash on stump - triamcinolone cream (KENALOG) 0.1 %; Apply 1 application topically 2 (two) times daily. To rash on leg  Dispense: 30 g; Refill: 1  4. Type II diabetes mellitus with complication ([place]) Doing well; she has been submitting a home test for A1C to [employer] every 6 months Last A1C was excellent  5. CKD stage 5 due to type 2 diabetes mellitus ([place]) Being followed closely by Neph; has not required HD yet Neph will also perform other labs and A1C  6. Body mass index (BMI) of 40.0-44.9 in adult [facility]) Weight is not accurate since she has a prothesis She is becoming more active now and can get more exercise to help avoid further gain  Form for ongoing disability is completed and faxed. Partially dictated using Editor, commissioning. Any errors are unintentional.  Cindy Maidens, Cindy Osborne [facility]  11/19/2019

## 2019-11-28 DIAGNOSIS — E1121 Type 2 diabetes mellitus with diabetic nephropathy: Secondary | ICD-10-CM | POA: Diagnosis not present

## 2019-11-28 DIAGNOSIS — D638 Anemia in other chronic diseases classified elsewhere: Secondary | ICD-10-CM | POA: Diagnosis not present

## 2019-11-28 DIAGNOSIS — E1122 Type 2 diabetes mellitus with diabetic chronic kidney disease: Secondary | ICD-10-CM | POA: Diagnosis not present

## 2019-11-28 DIAGNOSIS — N185 Chronic kidney disease, stage 5: Secondary | ICD-10-CM | POA: Diagnosis not present

## 2019-11-28 LAB — HEMOGLOBIN A1C: Hemoglobin A1C: 6

## 2019-12-02 DIAGNOSIS — D638 Anemia in other chronic diseases classified elsewhere: Secondary | ICD-10-CM | POA: Diagnosis not present

## 2019-12-02 DIAGNOSIS — E1121 Type 2 diabetes mellitus with diabetic nephropathy: Secondary | ICD-10-CM | POA: Diagnosis not present

## 2019-12-02 DIAGNOSIS — N185 Chronic kidney disease, stage 5: Secondary | ICD-10-CM | POA: Diagnosis not present

## 2019-12-02 DIAGNOSIS — I120 Hypertensive chronic kidney disease with stage 5 chronic kidney disease or end stage renal disease: Secondary | ICD-10-CM | POA: Diagnosis not present

## 2019-12-03 ENCOUNTER — Telehealth: Payer: Self-pay | Admitting: Internal Medicine

## 2019-12-03 NOTE — Telephone Encounter (Unsigned)
Copied from [place] [id number]. Topic: General - Inquiry >> Dec 03, 2019  2:54 PM Richardo Priest, [place] wrote: Reason for CRM: Patient called in to inform office that she will be stopping by tomorrow to drop off form for insurance and employer. Please advise as patient would like it faxed to employer when done and a copy mailed to her.

## 2019-12-03 NOTE — Telephone Encounter (Signed)
Reviewed.  CM

## 2019-12-31 DIAGNOSIS — Z89511 Acquired absence of right leg below knee: Secondary | ICD-10-CM | POA: Diagnosis not present

## 2020-01-08 DIAGNOSIS — Z89511 Acquired absence of right leg below knee: Secondary | ICD-10-CM | POA: Diagnosis not present

## 2020-01-28 DIAGNOSIS — D638 Anemia in other chronic diseases classified elsewhere: Secondary | ICD-10-CM | POA: Diagnosis not present

## 2020-01-28 DIAGNOSIS — Z89511 Acquired absence of right leg below knee: Secondary | ICD-10-CM | POA: Diagnosis not present

## 2020-01-28 DIAGNOSIS — N185 Chronic kidney disease, stage 5: Secondary | ICD-10-CM | POA: Diagnosis not present

## 2020-02-03 DIAGNOSIS — Z23 Encounter for immunization: Secondary | ICD-10-CM | POA: Diagnosis not present

## 2020-02-03 DIAGNOSIS — I10 Essential (primary) hypertension: Secondary | ICD-10-CM | POA: Diagnosis not present

## 2020-02-03 DIAGNOSIS — N185 Chronic kidney disease, stage 5: Secondary | ICD-10-CM | POA: Diagnosis not present

## 2020-02-03 DIAGNOSIS — E1121 Type 2 diabetes mellitus with diabetic nephropathy: Secondary | ICD-10-CM | POA: Diagnosis not present

## 2020-02-11 DIAGNOSIS — Z89511 Acquired absence of right leg below knee: Secondary | ICD-10-CM | POA: Diagnosis not present

## 2020-02-13 ENCOUNTER — Telehealth: Payer: Self-pay | Admitting: Internal Medicine

## 2020-02-13 NOTE — Telephone Encounter (Signed)
Patient is calling to see if Dr. [name] can order her lab work for her 02/26/20. Please advise CB- [phone]

## 2020-02-19 DIAGNOSIS — Z89511 Acquired absence of right leg below knee: Secondary | ICD-10-CM | POA: Diagnosis not present

## 2020-02-24 DIAGNOSIS — Z01419 Encounter for gynecological examination (general) (routine) without abnormal findings: Secondary | ICD-10-CM | POA: Diagnosis not present

## 2020-02-24 DIAGNOSIS — N184 Chronic kidney disease, stage 4 (severe): Secondary | ICD-10-CM | POA: Diagnosis not present

## 2020-02-24 DIAGNOSIS — Z1231 Encounter for screening mammogram for malignant neoplasm of breast: Secondary | ICD-10-CM | POA: Diagnosis not present

## 2020-02-24 DIAGNOSIS — Z202 Contact with and (suspected) exposure to infections with a predominantly sexual mode of transmission: Secondary | ICD-10-CM | POA: Diagnosis not present

## 2020-02-24 DIAGNOSIS — E1121 Type 2 diabetes mellitus with diabetic nephropathy: Secondary | ICD-10-CM | POA: Diagnosis not present

## 2020-02-24 LAB — HM HEPATITIS C SCREENING LAB: HM Hepatitis Screen: NEGATIVE

## 2020-02-24 LAB — HM HIV SCREENING LAB: HM HIV Screening: NEGATIVE

## 2020-02-25 DIAGNOSIS — Z89511 Acquired absence of right leg below knee: Secondary | ICD-10-CM | POA: Diagnosis not present

## 2020-02-26 ENCOUNTER — Encounter: Payer: Self-pay | Admitting: Internal Medicine

## 2020-02-26 ENCOUNTER — Ambulatory Visit (INDEPENDENT_AMBULATORY_CARE_PROVIDER_SITE_OTHER): Payer: Federal, State, Local not specified - PPO | Admitting: Internal Medicine

## 2020-02-26 ENCOUNTER — Other Ambulatory Visit: Payer: Self-pay

## 2020-02-26 VITALS — BP 144/86 | HR 70 | Temp 97.9°F | Ht 69.0 in | Wt 279.0 lb

## 2020-02-26 DIAGNOSIS — E1122 Type 2 diabetes mellitus with diabetic chronic kidney disease: Secondary | ICD-10-CM

## 2020-02-26 DIAGNOSIS — E118 Type 2 diabetes mellitus with unspecified complications: Secondary | ICD-10-CM

## 2020-02-26 DIAGNOSIS — E1169 Type 2 diabetes mellitus with other specified complication: Secondary | ICD-10-CM

## 2020-02-26 DIAGNOSIS — I10 Essential (primary) hypertension: Secondary | ICD-10-CM | POA: Diagnosis not present

## 2020-02-26 DIAGNOSIS — Z0000 Encounter for general adult medical examination without abnormal findings: Secondary | ICD-10-CM

## 2020-02-26 DIAGNOSIS — E785 Hyperlipidemia, unspecified: Secondary | ICD-10-CM

## 2020-02-26 DIAGNOSIS — I130 Hypertensive heart and chronic kidney disease with heart failure and stage 1 through stage 4 chronic kidney disease, or unspecified chronic kidney disease: Secondary | ICD-10-CM

## 2020-02-26 DIAGNOSIS — N185 Chronic kidney disease, stage 5: Secondary | ICD-10-CM

## 2020-02-26 DIAGNOSIS — E1151 Type 2 diabetes mellitus with diabetic peripheral angiopathy without gangrene: Secondary | ICD-10-CM

## 2020-02-26 DIAGNOSIS — IMO0002 Reserved for concepts with insufficient information to code with codable children: Secondary | ICD-10-CM | POA: Insufficient documentation

## 2020-02-26 DIAGNOSIS — E11319 Type 2 diabetes mellitus with unspecified diabetic retinopathy without macular edema: Secondary | ICD-10-CM

## 2020-02-26 DIAGNOSIS — E1165 Type 2 diabetes mellitus with hyperglycemia: Secondary | ICD-10-CM

## 2020-02-26 NOTE — Progress Notes (Signed)
Date:  02/26/2020   Name:  Cindy Osborne   DOB:  Feb 20, 1966   MRN:  [id number]   Chief Complaint: Annual Exam (no pap, no breast exam, no UA, foot exam ) and Referral (needs toes cut needs to see podiatrist)  Tanya Crothers is a 54 y.o. female who presents today for her Complete Annual Exam. She feels well. She reports exercising walking and ride bike twice a week and PT every 2 weeks. She reports she is sleeping well. Breast complaints none.  She recently had CPX and STI testing with GYN.  Mammogram ordered.  Mammogram: ordered at [facility] by GYN DEXA: none Pap smear: due 2023 Colonoscopy: 2016 normal  Immunization History  Administered Date(s) Administered  . Hepatitis B, adult 10/09/2019  . Influenza Inj Mdck Quad With Preservative 05/27/2018  . Influenza,inj,Quad PF,6+ Mos 04/09/2019  . Influenza-Unspecified 06/10/2016, 04/23/2017  . PFIZER SARS-COV-2 Vaccination 11/13/2019, 12/04/2019  . Pneumococcal Polysaccharide-23 02/26/2019    Hypertension This is a chronic problem. The problem is controlled. Pertinent negatives include no chest pain, headaches, palpitations or shortness of breath. Past treatments include ACE inhibitors, beta blockers and diuretics. The current treatment provides significant improvement. Hypertensive end-organ damage includes kidney disease and heart failure.  Diabetes She presents for her follow-up diabetic visit. She has type 2 diabetes mellitus. Her disease course has been stable. Pertinent negatives for hypoglycemia include no dizziness, headaches, nervousness/anxiousness or tremors. Pertinent negatives for diabetes include no chest pain and no fatigue. Current diabetic treatment includes insulin injections. An ACE inhibitor/angiotensin II receptor blocker is being taken. Eye exam is not current.  Hyperlipidemia The problem is controlled. Pertinent negatives include no chest pain or shortness of breath. Current antihyperlipidemic treatment includes  ezetimibe (intolerant of statins).  BKA - doing well with prosthesis.  Still going to PT and doing home exercise.  Her stump is shrinking so she needs orders for [employer] to adjust her sleeve and socket.  CKD - being followed by Nephrology.  Has AVG in left forearm.  Still feels well, has not needed dialysis.  No n/v, itching, etc.   Lab Results  Component Value Date   CREATININE 7.1 (A) 02/17/2019   BUN 76 (A) 02/17/2019   NA 136 (A) 02/17/2019   K 4.9 02/17/2019   Lab Results  Component Value Date   CHOL 155 02/24/2019   HDL 33 (L) 02/24/2019   LDLCALC 94 02/24/2019   TRIG 139 02/24/2019   CHOLHDL 4.7 (H) 02/24/2019   Lab Results  Component Value Date   TSH 1.600 02/24/2019   Lab Results  Component Value Date   HGBA1C 6.0 11/28/2019   Lab Results  Component Value Date   WBC 8.3 02/17/2019   HGB 9.6 (A) 02/17/2019   HCT 29 (A) 02/17/2019   PLT 227 02/17/2019   Lab Results  Component Value Date   ALT 9 02/24/2019   AST 9 02/24/2019   ALKPHOS 80 02/24/2019   BILITOT 0.3 02/24/2019     Review of Systems  Constitutional: Negative for chills, fatigue and fever.  HENT: Negative for congestion, hearing loss, tinnitus, trouble swallowing and voice change.   Eyes: Negative for visual disturbance.  Respiratory: Negative for cough, chest tightness, shortness of breath and wheezing.   Cardiovascular: Negative for chest pain, palpitations and leg swelling.  Gastrointestinal: Negative for abdominal pain, constipation, diarrhea and vomiting.  Genitourinary: Negative for dysuria, frequency, genital sores, vaginal bleeding and vaginal discharge.  Musculoskeletal: Positive for gait problem (doing well with her  prosthesis). Negative for arthralgias and joint swelling.  Skin: Negative for color change, rash and wound.  Allergic/Immunologic: Positive for environmental allergies.  Neurological: Positive for numbness. Negative for dizziness, tremors, light-headedness and headaches.   Hematological: Negative for adenopathy. Does not bruise/bleed easily.  Psychiatric/Behavioral: Negative for dysphoric mood and sleep disturbance. The patient is not nervous/anxious.     Patient Active Problem List   Diagnosis Date Noted  . Uncontrolled type 2 diabetes mellitus with diabetic peripheral angiopathy without gangrene ([place]) 02/26/2020  . Acquired absence of right leg below knee ([place]) 07/28/2019  . Diabetic retinopathy ([place]) 11/23/2016  . Slow transit constipation 09/24/2015  . Environmental and seasonal allergies 07/09/2015  . Anemia associated with chronic renal failure 07/09/2015  . CKD stage 5 due to type 2 diabetes mellitus ([place]) 07/09/2015  . Type II diabetes mellitus with complication ([place]) 94/70/9628  . Herpes simplex infection 07/09/2015  . Hyperlipidemia associated with type 2 diabetes mellitus ([place]) 07/09/2015  . Heart & renal disease, hypertensive, with heart failure ([place]) 07/09/2015  . Arthritis of knee, degenerative 07/09/2015  . Tendinitis 07/09/2015  . Avitaminosis D 07/09/2015  . Body mass index (BMI) of 40.0-44.9 in adult ([place]) 06/07/2015  . Obstructive apnea 12/15/2014  . Intracervical pessary 01/07/2014  . Essential hypertension 09/11/2013  . Amputated toe ([place]) 01/31/2012    Allergies  Allergen Reactions  . Atorvastatin     Other reaction(s): Other (See Comments) Back pain  . Peanut-Containing Drug Products Itching  . Pioglitazone     Other reaction(s): Edema  . Rosuvastatin Itching  . Shellfish-Derived Products Rash    shrimp  . Sulfa Antibiotics Rash    Past Surgical History:  Procedure Laterality Date  . avg for dialysis    . CHOLECYSTECTOMY    . COLONOSCOPY  08/15/2011   cleared for 10 yrs  . LEG AMPUTATION BELOW KNEE Right 04/2019  . TOE AMPUTATION Right 10/15/2018    Social History   Tobacco Use  . Smoking status: Never Smoker  . Smokeless tobacco: Never Used  Substance Use Topics  . Alcohol use: Yes    Alcohol/week: 0.0  standard drinks  . Drug use: No     Medication list has been reviewed and updated.  Current Meds  Medication Sig  . amLODipine (NORVASC) 10 MG tablet Take 1 tablet by mouth daily.  . benazepril (LOTENSIN) 40 MG tablet   . ezetimibe (ZETIA) 10 MG tablet Take 10 mg by mouth daily.   . ferric citrate (AURYXIA) 1 GM 210 MG(Fe) tablet 1 tablet at breakfast, 2 at lunch, and 1 dinner  . fluticasone (FLONASE) 50 MCG/ACT nasal spray Place 2 sprays into both nostrils daily.  . hydrochlorothiazide (HYDRODIURIL) 50 MG tablet Take 2 tablets by mouth daily.   . insulin aspart (NOVOLOG FLEXPEN) 100 UNIT/ML FlexPen Inject 5 Units into the skin 3 (three) times daily before meals.  . Insulin Glargine (LANTUS SOLOSTAR) 100 UNIT/ML Solostar Pen Inject 22 Units into the skin daily.  Marland Kitchen levonorgestrel (MIRENA, 52 MG,) 20 MCG/24HR IUD by Intrauterine route.  . metoprolol succinate (TOPROL-XL) 100 MG 24 hr tablet Take 1.5 tablets by mouth daily.  . sodium bicarbonate 650 MG tablet Take by mouth 3 (three) times daily.  Marland Kitchen triamcinolone cream (KENALOG) 0.1 % Apply 1 application topically 2 (two) times daily. To rash on leg    PHQ 2/9 Scores 02/26/2020 08/20/2019 07/28/2019 02/24/2019  PHQ - 2 Score 0 0 0 0  PHQ- 9 Score 0 0 1 0  GAD 7 : Generalized Anxiety Score 02/26/2020  Nervous, Anxious, on Edge 0  Control/stop worrying 0  Worry too much - different things 0  Trouble relaxing 0  Restless 0  Easily annoyed or irritable 0  Afraid - awful might happen 0  Total GAD 7 Score 0  Anxiety Difficulty Not difficult at all    BP Readings from Last 3 Encounters:  02/26/20 (!) 144/86  11/19/19 132/78  08/20/19 (!) 148/88    Physical Exam Vitals and nursing note reviewed.  Constitutional:      General: She is not in acute distress.    Appearance: She is well-developed.  HENT:     Head: Normocephalic and atraumatic.     Right Ear: Tympanic membrane and ear canal normal.     Left Ear: Tympanic membrane and  ear canal normal.     Nose:     Right Sinus: No maxillary sinus tenderness.     Left Sinus: No maxillary sinus tenderness.  Eyes:     General: No scleral icterus.       Right eye: No discharge.        Left eye: No discharge.     Conjunctiva/sclera: Conjunctivae normal.  Neck:     Thyroid: No thyromegaly.     Vascular: No carotid bruit.  Cardiovascular:     Rate and Rhythm: Normal rate and regular rhythm.     Pulses:          Dorsalis pedis pulses are 1+ on the left side. Right dorsalis pedis pulse not accessible.       Posterior tibial pulses are 1+ on the left side. Right posterior tibial pulse not accessible.     Heart sounds: Normal heart sounds. No murmur heard.      Arteriovenous access: left arteriovenous access is present. Pulmonary:     Effort: Pulmonary effort is normal. No respiratory distress.     Breath sounds: No wheezing.  Musculoskeletal:     Cervical back: Normal range of motion. No erythema.     Right lower leg: No edema.     Left lower leg: No edema.  Lymphadenopathy:     Cervical: No cervical adenopathy.  Skin:    General: Skin is warm and dry.     Findings: No rash.  Neurological:     Mental Status: She is alert and oriented to person, place, and time.     Cranial Nerves: No cranial nerve deficit.     Sensory: No sensory deficit.     Deep Tendon Reflexes: Reflexes are normal and symmetric.  Psychiatric:        Attention and Perception: Attention normal.        Mood and Affect: Mood normal.     Wt Readings from Last 3 Encounters:  02/26/20 279 lb (126.6 kg)  11/19/19 277 lb (125.6 kg)  08/20/19 254 lb (115.2 kg)    BP (!) 144/86   Pulse 70   Temp 97.9 F (36.6 C) (Oral)   Ht 5\' 9"  (1.753 m)   Wt 279 lb (126.6 kg)   SpO2 99%   BMI 41.20 kg/m   Assessment and Plan: 1. Annual physical exam Up to date on [facility] Mammogram ordered Eye exam upcoming Immunizations UTD  2. Essential hypertension Clinically stable exam with well controlled  BP. Tolerating medications without side effects at this time. Pt to continue current regimen and low sodium diet; benefits of regular exercise as able discussed. - TSH - CBC with Differential/Platelet  3. Type II diabetes mellitus with complication ([place]) Clinically stable by exam and report without s/s of hypoglycemia. DM complicated by lipids and obesity. Followed by [facility] Recent A1C well controlled  4. Hyperlipidemia associated with type 2 diabetes mellitus ([place]) Tolerating  zetia without side effects at this time LDL is almost at goal of < 70 on current dose Continue same therapy without change at this time. - Lipid panel - Hepatic function panel  5. Diabetic retinopathy of both eyes associated with type 2 diabetes mellitus, macular edema presence unspecified, unspecified retinopathy severity ([place]) Has followed up eye exam in the near future  6. Uncontrolled type 2 diabetes mellitus with diabetic peripheral angiopathy without gangrene ([place]) Stable LE vascular findings Needs podiatry to trim nails and do foot care  7. Heart & renal disease, hypertensive, with heart failure (HCC) Appears stable with normal volume status No chest pain, SOB or change in edema  8. CKD stage 5 due to type 2 diabetes mellitus ([place]) Followed by Nephrology; has not progressed to HD yet despite GFR = 6 Has AVG available in left forearm   Partially dictated using Dragon software. Any errors are unintentional.  Halina Maidens, MD [facility]  02/26/2020

## 2020-02-27 LAB — LIPID PANEL
Chol/HDL Ratio: 5.6 ratio — ABNORMAL HIGH (ref 0.0–4.4)
Cholesterol, Total: 208 mg/dL — ABNORMAL HIGH (ref 100–199)
HDL: 37 mg/dL — ABNORMAL LOW (ref >39)
LDL Chol Calc (NIH): 149 mg/dL — ABNORMAL HIGH (ref 0–99)
Triglycerides: 122 mg/dL (ref 0–149)
VLDL Cholesterol Cal: 22 mg/dL (ref 5–40)

## 2020-02-27 LAB — HEPATIC FUNCTION PANEL
ALT: 9 IU/L (ref 0–32)
AST: 10 IU/L (ref 0–40)
Albumin: 4.2 g/dL (ref 3.8–4.9)
Alkaline Phosphatase: 111 IU/L (ref 48–121)
Bilirubin Total: 0.2 mg/dL (ref 0.0–1.2)
Bilirubin, Direct: 0.07 mg/dL (ref 0.00–0.40)
Total Protein: 6.9 g/dL (ref 6.0–8.5)

## 2020-02-27 LAB — CBC WITH DIFFERENTIAL/PLATELET
Basophils Absolute: 0 10*3/uL (ref 0.0–0.2)
Basos: 0 %
EOS (ABSOLUTE): 0.3 10*3/uL (ref 0.0–0.4)
Eos: 3 %
Hematocrit: 30.7 % — ABNORMAL LOW (ref 34.0–46.6)
Hemoglobin: 10.3 g/dL — ABNORMAL LOW (ref 11.1–15.9)
Immature Grans (Abs): 0 10*3/uL (ref 0.0–0.1)
Immature Granulocytes: 0 %
Lymphocytes Absolute: 3.2 10*3/uL — ABNORMAL HIGH (ref 0.7–3.1)
Lymphs: 35 %
MCH: 30.5 pg (ref 26.6–33.0)
MCHC: 33.6 g/dL (ref 31.5–35.7)
MCV: 91 fL (ref 79–97)
Monocytes Absolute: 0.7 10*3/uL (ref 0.1–0.9)
Monocytes: 7 %
Neutrophils Absolute: 5 10*3/uL (ref 1.4–7.0)
Neutrophils: 55 %
Platelets: 222 10*3/uL (ref 150–450)
RBC: 3.38 x10E6/uL — ABNORMAL LOW (ref 3.77–5.28)
RDW: 11.8 % (ref 11.7–15.4)
WBC: 9.2 10*3/uL (ref 3.4–10.8)

## 2020-02-27 LAB — TSH: TSH: 2.07 u[IU]/mL (ref 0.450–4.500)

## 2020-03-01 ENCOUNTER — Other Ambulatory Visit: Payer: Self-pay | Admitting: Internal Medicine

## 2020-03-01 DIAGNOSIS — E1169 Type 2 diabetes mellitus with other specified complication: Secondary | ICD-10-CM

## 2020-03-01 MED ORDER — PRAVASTATIN SODIUM 20 MG PO TABS: 20.0000 mg | ORAL_TABLET | Freq: Every day | ORAL | 3 refills | Status: DC

## 2020-03-01 NOTE — Progress Notes (Signed)
Pt is willing to take another statin. She would like it to be sent into [facility] in [place]

## 2020-03-11 DIAGNOSIS — Z794 Long term (current) use of insulin: Secondary | ICD-10-CM | POA: Diagnosis not present

## 2020-03-11 DIAGNOSIS — E1121 Type 2 diabetes mellitus with diabetic nephropathy: Secondary | ICD-10-CM | POA: Diagnosis not present

## 2020-03-12 DIAGNOSIS — E1151 Type 2 diabetes mellitus with diabetic peripheral angiopathy without gangrene: Secondary | ICD-10-CM | POA: Diagnosis not present

## 2020-03-12 DIAGNOSIS — L603 Nail dystrophy: Secondary | ICD-10-CM | POA: Diagnosis not present

## 2020-03-17 DIAGNOSIS — Z89511 Acquired absence of right leg below knee: Secondary | ICD-10-CM | POA: Diagnosis not present

## 2020-04-05 DIAGNOSIS — N185 Chronic kidney disease, stage 5: Secondary | ICD-10-CM | POA: Diagnosis not present

## 2020-04-05 DIAGNOSIS — E1121 Type 2 diabetes mellitus with diabetic nephropathy: Secondary | ICD-10-CM | POA: Diagnosis not present

## 2020-04-05 DIAGNOSIS — E211 Secondary hyperparathyroidism, not elsewhere classified: Secondary | ICD-10-CM | POA: Diagnosis not present

## 2020-04-05 DIAGNOSIS — D631 Anemia in chronic kidney disease: Secondary | ICD-10-CM | POA: Diagnosis not present

## 2020-04-06 DIAGNOSIS — E1121 Type 2 diabetes mellitus with diabetic nephropathy: Secondary | ICD-10-CM | POA: Diagnosis not present

## 2020-04-06 DIAGNOSIS — N185 Chronic kidney disease, stage 5: Secondary | ICD-10-CM | POA: Diagnosis not present

## 2020-04-06 DIAGNOSIS — D631 Anemia in chronic kidney disease: Secondary | ICD-10-CM | POA: Diagnosis not present

## 2020-04-06 DIAGNOSIS — E785 Hyperlipidemia, unspecified: Secondary | ICD-10-CM | POA: Diagnosis not present

## 2020-04-08 DIAGNOSIS — Z89511 Acquired absence of right leg below knee: Secondary | ICD-10-CM | POA: Diagnosis not present

## 2020-04-22 ENCOUNTER — Telehealth: Payer: Self-pay | Admitting: Internal Medicine

## 2020-04-22 NOTE — Telephone Encounter (Signed)
Called and left patient a VM informing that we received her disability FAX form and faxed it off this morning with a confirmed fax receipt. Told her to call back with any further questions.  CM

## 2020-04-22 NOTE — Telephone Encounter (Signed)
Copied from [place] [id number]. Topic: General - Other >> Apr 21, 2020  4:06 PM Rainey Pines A wrote: Patient would like a callback in regards to if a fax was received regarding paperwork for Dr. [name] to fill out. Please advise

## 2020-06-02 DIAGNOSIS — D631 Anemia in chronic kidney disease: Secondary | ICD-10-CM | POA: Diagnosis not present

## 2020-06-02 DIAGNOSIS — N185 Chronic kidney disease, stage 5: Secondary | ICD-10-CM | POA: Diagnosis not present

## 2020-06-08 DIAGNOSIS — E1121 Type 2 diabetes mellitus with diabetic nephropathy: Secondary | ICD-10-CM | POA: Diagnosis not present

## 2020-06-08 DIAGNOSIS — I10 Essential (primary) hypertension: Secondary | ICD-10-CM | POA: Diagnosis not present

## 2020-06-08 DIAGNOSIS — D631 Anemia in chronic kidney disease: Secondary | ICD-10-CM | POA: Diagnosis not present

## 2020-06-08 DIAGNOSIS — N185 Chronic kidney disease, stage 5: Secondary | ICD-10-CM | POA: Diagnosis not present

## 2020-06-10 DIAGNOSIS — Z0289 Encounter for other administrative examinations: Secondary | ICD-10-CM

## 2020-06-11 DIAGNOSIS — L603 Nail dystrophy: Secondary | ICD-10-CM | POA: Diagnosis not present

## 2020-06-11 DIAGNOSIS — L97521 Non-pressure chronic ulcer of other part of left foot limited to breakdown of skin: Secondary | ICD-10-CM | POA: Diagnosis not present

## 2020-06-11 DIAGNOSIS — E1121 Type 2 diabetes mellitus with diabetic nephropathy: Secondary | ICD-10-CM | POA: Diagnosis not present

## 2020-06-11 DIAGNOSIS — Z794 Long term (current) use of insulin: Secondary | ICD-10-CM | POA: Diagnosis not present

## 2020-06-11 DIAGNOSIS — E1151 Type 2 diabetes mellitus with diabetic peripheral angiopathy without gangrene: Secondary | ICD-10-CM | POA: Diagnosis not present

## 2020-06-11 LAB — HEMOGLOBIN A1C: Hemoglobin A1C: 7

## 2020-06-15 DIAGNOSIS — R230 Cyanosis: Secondary | ICD-10-CM | POA: Diagnosis not present

## 2020-06-15 DIAGNOSIS — Z89511 Acquired absence of right leg below knee: Secondary | ICD-10-CM | POA: Diagnosis not present

## 2020-06-15 DIAGNOSIS — E1151 Type 2 diabetes mellitus with diabetic peripheral angiopathy without gangrene: Secondary | ICD-10-CM | POA: Diagnosis not present

## 2020-06-28 DIAGNOSIS — N184 Chronic kidney disease, stage 4 (severe): Secondary | ICD-10-CM | POA: Diagnosis not present

## 2020-06-28 DIAGNOSIS — I10 Essential (primary) hypertension: Secondary | ICD-10-CM | POA: Diagnosis not present

## 2020-06-28 DIAGNOSIS — I739 Peripheral vascular disease, unspecified: Secondary | ICD-10-CM | POA: Diagnosis not present

## 2020-06-30 ENCOUNTER — Other Ambulatory Visit: Payer: Self-pay

## 2020-06-30 ENCOUNTER — Encounter: Payer: Self-pay | Admitting: Internal Medicine

## 2020-06-30 ENCOUNTER — Ambulatory Visit: Payer: Federal, State, Local not specified - PPO | Admitting: Internal Medicine

## 2020-06-30 VITALS — BP 128/80 | HR 77 | Temp 98.2°F | Ht 69.0 in | Wt 286.0 lb

## 2020-06-30 DIAGNOSIS — I10 Essential (primary) hypertension: Secondary | ICD-10-CM

## 2020-06-30 DIAGNOSIS — E785 Hyperlipidemia, unspecified: Secondary | ICD-10-CM | POA: Diagnosis not present

## 2020-06-30 DIAGNOSIS — E1169 Type 2 diabetes mellitus with other specified complication: Secondary | ICD-10-CM | POA: Diagnosis not present

## 2020-06-30 DIAGNOSIS — IMO0002 Reserved for concepts with insufficient information to code with codable children: Secondary | ICD-10-CM

## 2020-06-30 DIAGNOSIS — E1151 Type 2 diabetes mellitus with diabetic peripheral angiopathy without gangrene: Secondary | ICD-10-CM

## 2020-06-30 DIAGNOSIS — E1165 Type 2 diabetes mellitus with hyperglycemia: Secondary | ICD-10-CM

## 2020-06-30 NOTE — Progress Notes (Signed)
Date:  06/30/2020   Name:  Cindy Osborne   DOB:  14-Jun-1966   MRN:  [id number]   Chief Complaint: Hyperlipidemia and right leg check  Hyperlipidemia This is a chronic problem. The problem is uncontrolled. Pertinent negatives include no chest pain or shortness of breath. Current antihyperlipidemic treatment includes statins (started last visit). There are no compliance problems.  Risk factors for coronary artery disease include diabetes mellitus and dyslipidemia.  Diabetes She presents for her follow-up diabetic visit. She has type 2 diabetes mellitus. Her disease course has been stable (last a1c = 7.0). Pertinent negatives for hypoglycemia include no dizziness or headaches. Pertinent negatives for diabetes include no chest pain and no fatigue. Diabetic complications include nephropathy and PVD. An ACE inhibitor/angiotensin II receptor blocker is being taken.  Hypertension This is a chronic problem. The problem is controlled. Pertinent negatives include no chest pain, headaches or shortness of breath. Past treatments include beta blockers, calcium channel blockers, angiotensin blockers and ACE inhibitors. The current treatment provides significant improvement. Hypertensive end-organ damage includes kidney disease and PVD.  She is now seeing a new Vascular Psychologist, sport and exercise.  Right lower leg had plaque but she can not undergo a dye study due to advanced renal disease.  She will see him again soon to discuss intervention options. Her right stump is healed; she is walking with her prosthesis and doing well. No ulcers or skin breakdown on left leg and foot.  Lab Results  Component Value Date   CREATININE 7.1 (A) 02/17/2019   BUN 76 (A) 02/17/2019   NA 136 (A) 02/17/2019   K 4.9 02/17/2019   Lab Results  Component Value Date   CHOL 208 (H) 02/26/2020   HDL 37 (L) 02/26/2020   LDLCALC 149 (H) 02/26/2020   TRIG 122 02/26/2020   CHOLHDL 5.6 (H) 02/26/2020   Lab Results  Component Value Date    TSH 2.070 02/26/2020   Lab Results  Component Value Date   HGBA1C 7.0 06/11/2020   Lab Results  Component Value Date   WBC 9.2 02/26/2020   HGB 10.3 (L) 02/26/2020   HCT 30.7 (L) 02/26/2020   MCV 91 02/26/2020   PLT 222 02/26/2020   Lab Results  Component Value Date   ALT 9 02/26/2020   AST 10 02/26/2020   ALKPHOS 111 02/26/2020   BILITOT 0.2 02/26/2020     Review of Systems  Constitutional: Negative for chills, fatigue, fever and unexpected weight change.  Respiratory: Negative for chest tightness and shortness of breath.   Cardiovascular: Negative for chest pain and leg swelling.  Musculoskeletal: Positive for gait problem (walking fairly well with prothesis alone). Negative for arthralgias and joint swelling.  Neurological: Negative for dizziness and headaches.  Psychiatric/Behavioral: Negative for dysphoric mood and sleep disturbance.    Patient Active Problem List   Diagnosis Date Noted  . Uncontrolled type 2 diabetes mellitus with diabetic peripheral angiopathy without gangrene ([place]) 02/26/2020  . Acquired absence of right leg below knee ([place]) 07/28/2019  . Diabetic retinopathy ([place]) 11/23/2016  . Slow transit constipation 09/24/2015  . Environmental and seasonal allergies 07/09/2015  . Anemia associated with chronic renal failure 07/09/2015  . CKD stage 5 due to type 2 diabetes mellitus ([place]) 07/09/2015  . Type II diabetes mellitus with complication ([place]) 54/62/7035  . Herpes simplex infection 07/09/2015  . Hyperlipidemia associated with type 2 diabetes mellitus ([place]) 07/09/2015  . Heart & renal disease, hypertensive, with heart failure ([place]) 07/09/2015  . Arthritis of knee,  degenerative 07/09/2015  . Tendinitis 07/09/2015  . Avitaminosis D 07/09/2015  . Body mass index (BMI) of 40.0-44.9 in adult ([place]) 06/07/2015  . Obstructive apnea 12/15/2014  . Intracervical pessary 01/07/2014  . Essential hypertension 09/11/2013  . Type 2 diabetes mellitus with diabetic  nephropathy ([place]) 09/11/2013  . Amputated toe ([place]) 01/31/2012    Allergies  Allergen Reactions  . Atorvastatin     Other reaction(s): Other (See Comments) Back pain  . Peanut-Containing Drug Products Itching  . Pioglitazone     Other reaction(s): Edema  . Rosuvastatin Itching  . Shellfish-Derived Products Rash    shrimp  . Sulfa Antibiotics Rash    Past Surgical History:  Procedure Laterality Date  . avg for dialysis    . CHOLECYSTECTOMY    . COLONOSCOPY  08/15/2011   cleared for 10 yrs  . LEG AMPUTATION BELOW KNEE Right 04/2019  . TOE AMPUTATION Right 10/15/2018    Social History   Tobacco Use  . Smoking status: Never Smoker  . Smokeless tobacco: Never Used  Substance Use Topics  . Alcohol use: Yes    Alcohol/week: 0.0 standard drinks  . Drug use: No     Medication list has been reviewed and updated.  Current Meds  Medication Sig  . amLODipine (NORVASC) 10 MG tablet Take 1 tablet by mouth daily.  . benazepril (LOTENSIN) 40 MG tablet   . Cholecalciferol 25 MCG (1000 UT) tablet Take by mouth daily. Takes 3 tablets  . ferric citrate (AURYXIA) 1 GM 210 MG(Fe) tablet 1 tablet at breakfast, 2 at lunch, and 1 dinner  . fluticasone (FLONASE) 50 MCG/ACT nasal spray Place 2 sprays into both nostrils daily.  . hydrochlorothiazide (HYDRODIURIL) 50 MG tablet Take 2 tablets by mouth daily.   . insulin aspart (NOVOLOG FLEXPEN) 100 UNIT/ML FlexPen Inject 5 Units into the skin 3 (three) times daily before meals.  . Insulin Glargine (LANTUS SOLOSTAR) 100 UNIT/ML Solostar Pen Inject 22 Units into the skin daily.  Marland Kitchen levonorgestrel (MIRENA, 52 MG,) 20 MCG/24HR IUD by Intrauterine route.  . metoprolol (TOPROL-XL) 200 MG 24 hr tablet Take 200 mg by mouth daily.  . montelukast (SINGULAIR) 10 MG tablet Take 1 tablet (10 mg total) by mouth at bedtime. (Patient taking differently: Take 10 mg by mouth as needed. )  . mupirocin ointment (BACTROBAN) 2 % mupirocin 2 % topical ointment  .  pravastatin (PRAVACHOL) 20 MG tablet Take 1 tablet (20 mg total) by mouth daily.  . sodium bicarbonate 650 MG tablet Take by mouth 3 (three) times daily.  Marland Kitchen triamcinolone cream (KENALOG) 0.1 % Apply 1 application topically 2 (two) times daily. To rash on leg  . [DISCONTINUED] ezetimibe (ZETIA) 10 MG tablet Take 10 mg by mouth daily.     PHQ 2/9 Scores 06/30/2020 02/26/2020 08/20/2019 07/28/2019  PHQ - 2 Score 0 0 0 0  PHQ- 9 Score 0 0 0 1    GAD 7 : Generalized Anxiety Score 06/30/2020 02/26/2020  Nervous, Anxious, on Edge 0 0  Control/stop worrying 0 0  Worry too much - different things 0 0  Trouble relaxing 0 0  Restless 0 0  Easily annoyed or irritable 0 0  Afraid - awful might happen 0 0  Total GAD 7 Score 0 0  Anxiety Difficulty - Not difficult at all    BP Readings from Last 3 Encounters:  06/30/20 128/80  02/26/20 (!) 144/86  11/19/19 132/78    Physical Exam Vitals and nursing note reviewed.  Constitutional:      General: She is not in acute distress.    Appearance: Normal appearance. She is well-developed.  HENT:     Head: Normocephalic and atraumatic.  Cardiovascular:     Rate and Rhythm: Normal rate and regular rhythm.     Pulses:          Popliteal pulses are 1+ on the right side and 1+ on the left side.       Dorsalis pedis pulses are 0 on the left side.       Posterior tibial pulses are 0 on the left side.     Heart sounds: No murmur heard.   Pulmonary:     Effort: Pulmonary effort is normal. No respiratory distress.     Breath sounds: No wheezing or rhonchi.  Musculoskeletal:     Cervical back: Normal range of motion.     Left lower leg: No edema.     Right Lower Extremity: Right leg is amputated below knee.  Skin:    General: Skin is warm and dry.     Findings: No rash.  Neurological:     Mental Status: She is alert and oriented to person, place, and time.  Psychiatric:        Attention and Perception: Attention normal.        Mood and Affect: Mood  normal.     Wt Readings from Last 3 Encounters:  06/30/20 286 lb (129.7 kg)  02/26/20 279 lb (126.6 kg)  11/19/19 277 lb (125.6 kg)    BP 128/80   Pulse 77   Temp 98.2 F (36.8 C) (Oral)   Ht 5\' 9"  (1.753 m)   Wt 286 lb (129.7 kg)   SpO2 99%   BMI 42.23 kg/m   Assessment and Plan: 1. Hyperlipidemia associated with type 2 diabetes mellitus ([place]) Now on pravachol and doing well without side effects Will get follow up labs Return in 6 months - Comprehensive metabolic panel - Lipid panel  2. Uncontrolled type 2 diabetes mellitus with diabetic peripheral angiopathy without gangrene ([place]) Begin followed by Endo and vascular surgery A1C is fairly well controlled Will follow up regarding LLE revascularization  3. Essential hypertension Clinically stable exam with well controlled BP on metoprolol, benazepril and amlodipine. Tolerating medications without side effects at this time. Pt to continue current regimen and low sodium diet; benefits of regular exercise as able discussed.   Partially dictated using Editor, commissioning. Any errors are unintentional.  Halina Maidens, MD [facility]  06/30/2020

## 2020-07-01 LAB — COMPREHENSIVE METABOLIC PANEL
ALT: 10 IU/L (ref 0–32)
AST: 8 IU/L (ref 0–40)
Albumin/Globulin Ratio: 1.4 (ref 1.2–2.2)
Albumin: 4.2 g/dL (ref 3.8–4.9)
Alkaline Phosphatase: 118 IU/L (ref 44–121)
BUN/Creatinine Ratio: 10 (ref 9–23)
BUN: 80 mg/dL (ref 6–24)
Bilirubin Total: 0.2 mg/dL (ref 0.0–1.2)
CO2: 18 mmol/L — ABNORMAL LOW (ref 20–29)
Calcium: 9.9 mg/dL (ref 8.7–10.2)
Chloride: 102 mmol/L (ref 96–106)
Creatinine, Ser: 7.63 mg/dL — ABNORMAL HIGH (ref 0.57–1.00)
GFR calc Af Amer: 6 mL/min/{1.73_m2} — ABNORMAL LOW (ref >59)
GFR calc non Af Amer: 5 mL/min/{1.73_m2} — ABNORMAL LOW (ref >59)
Globulin, Total: 2.9 g/dL (ref 1.5–4.5)
Glucose: 73 mg/dL (ref 65–99)
Potassium: 5.2 mmol/L (ref 3.5–5.2)
Sodium: 137 mmol/L (ref 134–144)
Total Protein: 7.1 g/dL (ref 6.0–8.5)

## 2020-07-01 LAB — LIPID PANEL
Chol/HDL Ratio: 5.7 ratio — ABNORMAL HIGH (ref 0.0–4.4)
Cholesterol, Total: 177 mg/dL (ref 100–199)
HDL: 31 mg/dL — ABNORMAL LOW (ref >39)
LDL Chol Calc (NIH): 112 mg/dL — ABNORMAL HIGH (ref 0–99)
Triglycerides: 191 mg/dL — ABNORMAL HIGH (ref 0–149)
VLDL Cholesterol Cal: 34 mg/dL (ref 5–40)

## 2020-07-02 DIAGNOSIS — L97522 Non-pressure chronic ulcer of other part of left foot with fat layer exposed: Secondary | ICD-10-CM | POA: Diagnosis not present

## 2020-07-02 DIAGNOSIS — E1151 Type 2 diabetes mellitus with diabetic peripheral angiopathy without gangrene: Secondary | ICD-10-CM | POA: Diagnosis not present

## 2020-07-13 DIAGNOSIS — E1151 Type 2 diabetes mellitus with diabetic peripheral angiopathy without gangrene: Secondary | ICD-10-CM | POA: Diagnosis not present

## 2020-07-13 DIAGNOSIS — L97521 Non-pressure chronic ulcer of other part of left foot limited to breakdown of skin: Secondary | ICD-10-CM | POA: Diagnosis not present

## 2020-07-14 DIAGNOSIS — E1140 Type 2 diabetes mellitus with diabetic neuropathy, unspecified: Secondary | ICD-10-CM | POA: Diagnosis not present

## 2020-07-14 DIAGNOSIS — T148XXA Other injury of unspecified body region, initial encounter: Secondary | ICD-10-CM | POA: Diagnosis not present

## 2020-07-14 DIAGNOSIS — E669 Obesity, unspecified: Secondary | ICD-10-CM | POA: Diagnosis not present

## 2020-07-14 DIAGNOSIS — E785 Hyperlipidemia, unspecified: Secondary | ICD-10-CM | POA: Diagnosis not present

## 2020-07-14 DIAGNOSIS — Z01818 Encounter for other preprocedural examination: Secondary | ICD-10-CM | POA: Diagnosis not present

## 2020-07-14 DIAGNOSIS — N189 Chronic kidney disease, unspecified: Secondary | ICD-10-CM | POA: Diagnosis not present

## 2020-07-14 DIAGNOSIS — N179 Acute kidney failure, unspecified: Secondary | ICD-10-CM | POA: Diagnosis not present

## 2020-07-14 DIAGNOSIS — D72828 Other elevated white blood cell count: Secondary | ICD-10-CM | POA: Diagnosis not present

## 2020-07-14 DIAGNOSIS — I70222 Atherosclerosis of native arteries of extremities with rest pain, left leg: Secondary | ICD-10-CM | POA: Diagnosis not present

## 2020-07-14 DIAGNOSIS — I120 Hypertensive chronic kidney disease with stage 5 chronic kidney disease or end stage renal disease: Secondary | ICD-10-CM | POA: Diagnosis not present

## 2020-07-14 DIAGNOSIS — Z20822 Contact with and (suspected) exposure to covid-19: Secondary | ICD-10-CM | POA: Diagnosis not present

## 2020-07-14 DIAGNOSIS — Z6841 Body Mass Index (BMI) 40.0 and over, adult: Secondary | ICD-10-CM | POA: Diagnosis not present

## 2020-07-14 DIAGNOSIS — E1151 Type 2 diabetes mellitus with diabetic peripheral angiopathy without gangrene: Secondary | ICD-10-CM | POA: Diagnosis not present

## 2020-07-14 DIAGNOSIS — E1122 Type 2 diabetes mellitus with diabetic chronic kidney disease: Secondary | ICD-10-CM | POA: Diagnosis not present

## 2020-07-14 DIAGNOSIS — R059 Cough, unspecified: Secondary | ICD-10-CM | POA: Diagnosis not present

## 2020-07-14 DIAGNOSIS — T391X5A Adverse effect of 4-Aminophenol derivatives, initial encounter: Secondary | ICD-10-CM | POA: Diagnosis not present

## 2020-07-14 DIAGNOSIS — L03116 Cellulitis of left lower limb: Secondary | ICD-10-CM | POA: Diagnosis not present

## 2020-07-14 DIAGNOSIS — L97509 Non-pressure chronic ulcer of other part of unspecified foot with unspecified severity: Secondary | ICD-10-CM | POA: Diagnosis not present

## 2020-07-14 DIAGNOSIS — N184 Chronic kidney disease, stage 4 (severe): Secondary | ICD-10-CM | POA: Diagnosis not present

## 2020-07-14 DIAGNOSIS — I739 Peripheral vascular disease, unspecified: Secondary | ICD-10-CM | POA: Diagnosis not present

## 2020-07-14 DIAGNOSIS — D649 Anemia, unspecified: Secondary | ICD-10-CM | POA: Diagnosis not present

## 2020-07-14 DIAGNOSIS — N185 Chronic kidney disease, stage 5: Secondary | ICD-10-CM | POA: Diagnosis not present

## 2020-07-14 DIAGNOSIS — L97529 Non-pressure chronic ulcer of other part of left foot with unspecified severity: Secondary | ICD-10-CM | POA: Diagnosis not present

## 2020-07-14 HISTORY — PX: OTHER SURGICAL HISTORY: SHX169

## 2020-07-15 DIAGNOSIS — N179 Acute kidney failure, unspecified: Secondary | ICD-10-CM | POA: Diagnosis not present

## 2020-07-15 DIAGNOSIS — I120 Hypertensive chronic kidney disease with stage 5 chronic kidney disease or end stage renal disease: Secondary | ICD-10-CM | POA: Diagnosis not present

## 2020-07-15 DIAGNOSIS — I10 Essential (primary) hypertension: Secondary | ICD-10-CM | POA: Diagnosis not present

## 2020-07-15 DIAGNOSIS — I739 Peripheral vascular disease, unspecified: Secondary | ICD-10-CM | POA: Diagnosis not present

## 2020-07-15 DIAGNOSIS — I70202 Unspecified atherosclerosis of native arteries of extremities, left leg: Secondary | ICD-10-CM | POA: Diagnosis not present

## 2020-07-15 DIAGNOSIS — R208 Other disturbances of skin sensation: Secondary | ICD-10-CM | POA: Diagnosis not present

## 2020-07-15 DIAGNOSIS — Z01818 Encounter for other preprocedural examination: Secondary | ICD-10-CM | POA: Diagnosis not present

## 2020-07-15 DIAGNOSIS — N185 Chronic kidney disease, stage 5: Secondary | ICD-10-CM | POA: Diagnosis not present

## 2020-07-15 DIAGNOSIS — N184 Chronic kidney disease, stage 4 (severe): Secondary | ICD-10-CM | POA: Diagnosis not present

## 2020-07-15 DIAGNOSIS — T148XXA Other injury of unspecified body region, initial encounter: Secondary | ICD-10-CM | POA: Diagnosis not present

## 2020-07-15 DIAGNOSIS — E119 Type 2 diabetes mellitus without complications: Secondary | ICD-10-CM | POA: Diagnosis not present

## 2020-07-15 DIAGNOSIS — E1121 Type 2 diabetes mellitus with diabetic nephropathy: Secondary | ICD-10-CM | POA: Diagnosis not present

## 2020-07-15 DIAGNOSIS — M7989 Other specified soft tissue disorders: Secondary | ICD-10-CM | POA: Diagnosis not present

## 2020-07-15 DIAGNOSIS — L03116 Cellulitis of left lower limb: Secondary | ICD-10-CM | POA: Diagnosis not present

## 2020-07-16 DIAGNOSIS — I70202 Unspecified atherosclerosis of native arteries of extremities, left leg: Secondary | ICD-10-CM | POA: Diagnosis not present

## 2020-07-16 DIAGNOSIS — M7989 Other specified soft tissue disorders: Secondary | ICD-10-CM | POA: Diagnosis not present

## 2020-07-16 DIAGNOSIS — D631 Anemia in chronic kidney disease: Secondary | ICD-10-CM | POA: Diagnosis not present

## 2020-07-16 DIAGNOSIS — I120 Hypertensive chronic kidney disease with stage 5 chronic kidney disease or end stage renal disease: Secondary | ICD-10-CM | POA: Diagnosis not present

## 2020-07-16 DIAGNOSIS — I10 Essential (primary) hypertension: Secondary | ICD-10-CM | POA: Diagnosis not present

## 2020-07-16 DIAGNOSIS — N179 Acute kidney failure, unspecified: Secondary | ICD-10-CM | POA: Diagnosis not present

## 2020-07-16 DIAGNOSIS — I739 Peripheral vascular disease, unspecified: Secondary | ICD-10-CM | POA: Diagnosis not present

## 2020-07-16 DIAGNOSIS — N185 Chronic kidney disease, stage 5: Secondary | ICD-10-CM | POA: Diagnosis not present

## 2020-07-16 DIAGNOSIS — N184 Chronic kidney disease, stage 4 (severe): Secondary | ICD-10-CM | POA: Diagnosis not present

## 2020-07-16 DIAGNOSIS — E119 Type 2 diabetes mellitus without complications: Secondary | ICD-10-CM | POA: Diagnosis not present

## 2020-07-16 DIAGNOSIS — L03116 Cellulitis of left lower limb: Secondary | ICD-10-CM | POA: Diagnosis not present

## 2020-07-17 DIAGNOSIS — I120 Hypertensive chronic kidney disease with stage 5 chronic kidney disease or end stage renal disease: Secondary | ICD-10-CM | POA: Diagnosis not present

## 2020-07-17 DIAGNOSIS — I10 Essential (primary) hypertension: Secondary | ICD-10-CM | POA: Diagnosis not present

## 2020-07-17 DIAGNOSIS — N179 Acute kidney failure, unspecified: Secondary | ICD-10-CM | POA: Diagnosis not present

## 2020-07-17 DIAGNOSIS — N185 Chronic kidney disease, stage 5: Secondary | ICD-10-CM | POA: Diagnosis not present

## 2020-07-17 DIAGNOSIS — L03116 Cellulitis of left lower limb: Secondary | ICD-10-CM | POA: Diagnosis not present

## 2020-07-17 DIAGNOSIS — I739 Peripheral vascular disease, unspecified: Secondary | ICD-10-CM | POA: Diagnosis not present

## 2020-07-17 DIAGNOSIS — N184 Chronic kidney disease, stage 4 (severe): Secondary | ICD-10-CM | POA: Diagnosis not present

## 2020-07-17 DIAGNOSIS — D631 Anemia in chronic kidney disease: Secondary | ICD-10-CM | POA: Diagnosis not present

## 2020-07-18 DIAGNOSIS — N185 Chronic kidney disease, stage 5: Secondary | ICD-10-CM | POA: Diagnosis not present

## 2020-07-18 DIAGNOSIS — I10 Essential (primary) hypertension: Secondary | ICD-10-CM | POA: Diagnosis not present

## 2020-07-18 DIAGNOSIS — D631 Anemia in chronic kidney disease: Secondary | ICD-10-CM | POA: Diagnosis not present

## 2020-07-18 DIAGNOSIS — I739 Peripheral vascular disease, unspecified: Secondary | ICD-10-CM | POA: Diagnosis not present

## 2020-07-18 DIAGNOSIS — I120 Hypertensive chronic kidney disease with stage 5 chronic kidney disease or end stage renal disease: Secondary | ICD-10-CM | POA: Diagnosis not present

## 2020-07-18 DIAGNOSIS — N184 Chronic kidney disease, stage 4 (severe): Secondary | ICD-10-CM | POA: Diagnosis not present

## 2020-07-18 DIAGNOSIS — L03116 Cellulitis of left lower limb: Secondary | ICD-10-CM | POA: Diagnosis not present

## 2020-07-18 DIAGNOSIS — N179 Acute kidney failure, unspecified: Secondary | ICD-10-CM | POA: Diagnosis not present

## 2020-07-19 DIAGNOSIS — N185 Chronic kidney disease, stage 5: Secondary | ICD-10-CM | POA: Diagnosis not present

## 2020-07-19 DIAGNOSIS — I120 Hypertensive chronic kidney disease with stage 5 chronic kidney disease or end stage renal disease: Secondary | ICD-10-CM | POA: Diagnosis not present

## 2020-07-19 DIAGNOSIS — N184 Chronic kidney disease, stage 4 (severe): Secondary | ICD-10-CM | POA: Diagnosis not present

## 2020-07-19 DIAGNOSIS — I739 Peripheral vascular disease, unspecified: Secondary | ICD-10-CM | POA: Diagnosis not present

## 2020-07-19 DIAGNOSIS — D631 Anemia in chronic kidney disease: Secondary | ICD-10-CM | POA: Diagnosis not present

## 2020-07-19 DIAGNOSIS — N179 Acute kidney failure, unspecified: Secondary | ICD-10-CM | POA: Diagnosis not present

## 2020-07-19 DIAGNOSIS — L03116 Cellulitis of left lower limb: Secondary | ICD-10-CM | POA: Diagnosis not present

## 2020-07-19 DIAGNOSIS — I10 Essential (primary) hypertension: Secondary | ICD-10-CM | POA: Diagnosis not present

## 2020-07-20 DIAGNOSIS — N185 Chronic kidney disease, stage 5: Secondary | ICD-10-CM | POA: Diagnosis not present

## 2020-07-20 DIAGNOSIS — N179 Acute kidney failure, unspecified: Secondary | ICD-10-CM | POA: Diagnosis not present

## 2020-07-20 DIAGNOSIS — N184 Chronic kidney disease, stage 4 (severe): Secondary | ICD-10-CM | POA: Diagnosis not present

## 2020-07-20 DIAGNOSIS — I120 Hypertensive chronic kidney disease with stage 5 chronic kidney disease or end stage renal disease: Secondary | ICD-10-CM | POA: Diagnosis not present

## 2020-07-20 DIAGNOSIS — N189 Chronic kidney disease, unspecified: Secondary | ICD-10-CM | POA: Diagnosis not present

## 2020-07-20 DIAGNOSIS — D631 Anemia in chronic kidney disease: Secondary | ICD-10-CM | POA: Diagnosis not present

## 2020-07-20 DIAGNOSIS — Z89511 Acquired absence of right leg below knee: Secondary | ICD-10-CM | POA: Diagnosis not present

## 2020-07-20 DIAGNOSIS — I7092 Chronic total occlusion of artery of the extremities: Secondary | ICD-10-CM | POA: Diagnosis not present

## 2020-07-20 DIAGNOSIS — I739 Peripheral vascular disease, unspecified: Secondary | ICD-10-CM | POA: Diagnosis not present

## 2020-07-20 DIAGNOSIS — L03116 Cellulitis of left lower limb: Secondary | ICD-10-CM | POA: Diagnosis not present

## 2020-07-20 DIAGNOSIS — I70222 Atherosclerosis of native arteries of extremities with rest pain, left leg: Secondary | ICD-10-CM | POA: Diagnosis not present

## 2020-07-20 DIAGNOSIS — E1121 Type 2 diabetes mellitus with diabetic nephropathy: Secondary | ICD-10-CM | POA: Diagnosis not present

## 2020-07-21 DIAGNOSIS — D631 Anemia in chronic kidney disease: Secondary | ICD-10-CM | POA: Diagnosis not present

## 2020-07-21 DIAGNOSIS — E1121 Type 2 diabetes mellitus with diabetic nephropathy: Secondary | ICD-10-CM | POA: Diagnosis not present

## 2020-07-21 DIAGNOSIS — N185 Chronic kidney disease, stage 5: Secondary | ICD-10-CM | POA: Diagnosis not present

## 2020-07-21 DIAGNOSIS — L03116 Cellulitis of left lower limb: Secondary | ICD-10-CM | POA: Diagnosis not present

## 2020-07-21 DIAGNOSIS — I120 Hypertensive chronic kidney disease with stage 5 chronic kidney disease or end stage renal disease: Secondary | ICD-10-CM | POA: Diagnosis not present

## 2020-07-21 DIAGNOSIS — I739 Peripheral vascular disease, unspecified: Secondary | ICD-10-CM | POA: Diagnosis not present

## 2020-07-21 DIAGNOSIS — N179 Acute kidney failure, unspecified: Secondary | ICD-10-CM | POA: Diagnosis not present

## 2020-07-21 DIAGNOSIS — N184 Chronic kidney disease, stage 4 (severe): Secondary | ICD-10-CM | POA: Diagnosis not present

## 2020-07-22 DIAGNOSIS — I120 Hypertensive chronic kidney disease with stage 5 chronic kidney disease or end stage renal disease: Secondary | ICD-10-CM | POA: Diagnosis not present

## 2020-07-22 DIAGNOSIS — N185 Chronic kidney disease, stage 5: Secondary | ICD-10-CM | POA: Diagnosis not present

## 2020-07-22 DIAGNOSIS — N179 Acute kidney failure, unspecified: Secondary | ICD-10-CM | POA: Diagnosis not present

## 2020-07-22 DIAGNOSIS — D631 Anemia in chronic kidney disease: Secondary | ICD-10-CM | POA: Diagnosis not present

## 2020-07-23 DIAGNOSIS — D631 Anemia in chronic kidney disease: Secondary | ICD-10-CM | POA: Diagnosis not present

## 2020-07-23 DIAGNOSIS — N179 Acute kidney failure, unspecified: Secondary | ICD-10-CM | POA: Diagnosis not present

## 2020-07-23 DIAGNOSIS — I120 Hypertensive chronic kidney disease with stage 5 chronic kidney disease or end stage renal disease: Secondary | ICD-10-CM | POA: Diagnosis not present

## 2020-07-23 DIAGNOSIS — N185 Chronic kidney disease, stage 5: Secondary | ICD-10-CM | POA: Diagnosis not present

## 2020-07-23 DIAGNOSIS — L03116 Cellulitis of left lower limb: Secondary | ICD-10-CM | POA: Diagnosis not present

## 2020-07-28 ENCOUNTER — Other Ambulatory Visit: Payer: Self-pay

## 2020-07-28 ENCOUNTER — Ambulatory Visit: Payer: Federal, State, Local not specified - PPO | Admitting: Internal Medicine

## 2020-07-28 ENCOUNTER — Encounter: Payer: Self-pay | Admitting: Internal Medicine

## 2020-07-28 VITALS — BP 128/80 | HR 76 | Temp 98.5°F | Ht 69.0 in | Wt 280.0 lb

## 2020-07-28 DIAGNOSIS — Z794 Long term (current) use of insulin: Secondary | ICD-10-CM | POA: Diagnosis not present

## 2020-07-28 DIAGNOSIS — Z6841 Body Mass Index (BMI) 40.0 and over, adult: Secondary | ICD-10-CM | POA: Diagnosis not present

## 2020-07-28 DIAGNOSIS — I739 Peripheral vascular disease, unspecified: Secondary | ICD-10-CM | POA: Insufficient documentation

## 2020-07-28 DIAGNOSIS — L03032 Cellulitis of left toe: Secondary | ICD-10-CM

## 2020-07-28 DIAGNOSIS — E1121 Type 2 diabetes mellitus with diabetic nephropathy: Secondary | ICD-10-CM | POA: Diagnosis not present

## 2020-07-28 DIAGNOSIS — Z89511 Acquired absence of right leg below knee: Secondary | ICD-10-CM | POA: Diagnosis not present

## 2020-07-28 MED ORDER — CLINDAMYCIN HCL 300 MG PO CAPS: 300.0000 mg | ORAL_CAPSULE | Freq: Three times a day (TID) | ORAL | 0 refills | Status: AC

## 2020-07-28 NOTE — Progress Notes (Signed)
Date:  07/28/2020   Name:  Cindy Osborne   DOB:  11-Apr-1966   MRN:  [id number]   Chief Complaint: Hyperlipidemia and Foot Problem (Went to ER 07/16/20 for ulcer on toe per podiatrist. Opened veins (surgery) in hopsital so pt could get circulation in foot, foots not getting better with healing process. Dr. Rockey Situ pt that PCP needs to look at foot )  Wound Check She was originally treated more than 14 days ago. Previous treatment included oral antibiotics (and politeal angioplasty). There has been no drainage from the wound. The redness has not changed. The swelling has worsened (since being up on her foot today). There is no pain present.  Treated with 2 rounds of antibiotics without improvement.  Admitted to [facility] and treated with IV Vancomycin and a left angioplasty was performed with improvement in blood flow.  She was discharged home without antibiotics.  Instructed to clean the wounds and apply a dry dressing.  She is concerned that the wound is not improving.  There is no pain due to neuropathy.  No bleeding and no drainage.  She can not see the area very well. Her follow up is in 5 days with vascular surgery but they told her to come here for evaluation.  Lab Results  Component Value Date   CREATININE 7.63 (H) 06/30/2020   BUN 80 (HH) 06/30/2020   NA 137 06/30/2020   K 5.2 06/30/2020   CL 102 06/30/2020   CO2 18 (L) 06/30/2020   Lab Results  Component Value Date   CHOL 177 06/30/2020   HDL 31 (L) 06/30/2020   LDLCALC 112 (H) 06/30/2020   TRIG 191 (H) 06/30/2020   CHOLHDL 5.7 (H) 06/30/2020   Lab Results  Component Value Date   TSH 2.070 02/26/2020   Lab Results  Component Value Date   HGBA1C 7.0 06/11/2020   Lab Results  Component Value Date   WBC 9.2 02/26/2020   HGB 10.3 (L) 02/26/2020   HCT 30.7 (L) 02/26/2020   MCV 91 02/26/2020   PLT 222 02/26/2020   Lab Results  Component Value Date   ALT 10 06/30/2020   AST 8 06/30/2020   ALKPHOS 118 06/30/2020    BILITOT 0.2 06/30/2020     Review of Systems  Constitutional: Negative for chills, fatigue and fever.  Skin: Positive for color change and wound.    Patient Active Problem List   Diagnosis Date Noted  . Uncontrolled type 2 diabetes mellitus with diabetic peripheral angiopathy without gangrene ([place]) 02/26/2020  . Acquired absence of right leg below knee ([place]) 07/28/2019  . Diabetic retinopathy ([place]) 11/23/2016  . Slow transit constipation 09/24/2015  . Environmental and seasonal allergies 07/09/2015  . Anemia associated with chronic renal failure 07/09/2015  . CKD stage 5 due to type 2 diabetes mellitus ([place]) 07/09/2015  . Type II diabetes mellitus with complication ([place]) 63/87/5643  . Herpes simplex infection 07/09/2015  . Hyperlipidemia associated with type 2 diabetes mellitus ([place]) 07/09/2015  . Heart & renal disease, hypertensive, with heart failure ([place]) 07/09/2015  . Arthritis of knee, degenerative 07/09/2015  . Tendinitis 07/09/2015  . Avitaminosis D 07/09/2015  . Body mass index (BMI) of 40.0-44.9 in adult ([place]) 06/07/2015  . Obstructive apnea 12/15/2014  . Intracervical pessary 01/07/2014  . Essential hypertension 09/11/2013  . Type 2 diabetes mellitus with diabetic nephropathy ([place]) 09/11/2013  . Amputated toe ([place]) 01/31/2012    Allergies  Allergen Reactions  . Atorvastatin     Other  reaction(s): Other (See Comments) Back pain  . Peanut-Containing Drug Products Itching  . Pioglitazone     Other reaction(s): Edema  . Rosuvastatin Itching  . Shellfish-Derived Products Rash    shrimp  . Sulfa Antibiotics Rash    Past Surgical History:  Procedure Laterality Date  . avg for dialysis    . CHOLECYSTECTOMY    . COLONOSCOPY  08/15/2011   cleared for 10 yrs  . LEG AMPUTATION BELOW KNEE Right 04/2019  . TOE AMPUTATION Right 10/15/2018    Social History   Tobacco Use  . Smoking status: Never Smoker  . Smokeless tobacco: Never Used  Substance Use Topics  .  Alcohol use: Yes    Alcohol/week: 0.0 standard drinks  . Drug use: No     Medication list has been reviewed and updated.  Current Meds  Medication Sig  . Acetaminophen (TYLENOL PO) Take by mouth.  Marland Kitchen amLODipine (NORVASC) 10 MG tablet Take 1 tablet by mouth daily.  Marland Kitchen aspirin 81 MG EC tablet Take by mouth.  . benazepril (LOTENSIN) 40 MG tablet   . Cholecalciferol 25 MCG (1000 UT) tablet Take by mouth daily. Takes 3 tablets  . clopidogrel (PLAVIX) 75 MG tablet Take 75 mg by mouth daily.  . ferric citrate (AURYXIA) 1 GM 210 MG(Fe) tablet 1 tablet at breakfast, 2 at lunch, and 1 dinner  . fluticasone (FLONASE) 50 MCG/ACT nasal spray Place 2 sprays into both nostrils daily.  . insulin aspart (NOVOLOG) 100 UNIT/ML FlexPen Inject 5 Units into the skin 3 (three) times daily before meals.  . insulin glargine (LANTUS) 100 UNIT/ML Solostar Pen Inject 24 Units into the skin daily.  Marland Kitchen losartan (COZAAR) 25 MG tablet Take 10 mg by mouth daily.  . metoprolol (TOPROL-XL) 200 MG 24 hr tablet Take 200 mg by mouth daily.  . montelukast (SINGULAIR) 10 MG tablet Take 1 tablet (10 mg total) by mouth at bedtime. (Patient taking differently: Take 10 mg by mouth as needed.)  . mupirocin ointment (BACTROBAN) 2 % mupirocin 2 % topical ointment  . pravastatin (PRAVACHOL) 20 MG tablet Take 1 tablet (20 mg total) by mouth daily.  . sennosides-docusate sodium (SENOKOT-S) 8.6-50 MG tablet Take 1 tablet by mouth as needed for constipation.  . sodium bicarbonate 650 MG tablet Take by mouth 3 (three) times daily.  Marland Kitchen triamcinolone cream (KENALOG) 0.1 % Apply 1 application topically 2 (two) times daily. To rash on leg    PHQ 2/9 Scores 07/28/2020 06/30/2020 02/26/2020 08/20/2019  PHQ - 2 Score 0 0 0 0  PHQ- 9 Score 0 0 0 0    GAD 7 : Generalized Anxiety Score 07/28/2020 06/30/2020 02/26/2020  Nervous, Anxious, on Edge 0 0 0  Control/stop worrying 0 0 0  Worry too much - different things 0 0 0  Trouble relaxing 0 0 0   Restless 0 0 0  Easily annoyed or irritable 0 0 0  Afraid - awful might happen 0 0 0  Total GAD 7 Score 0 0 0  Anxiety Difficulty - - Not difficult at all    BP Readings from Last 3 Encounters:  07/28/20 128/80  06/30/20 128/80  02/26/20 (!) 144/86    Physical Exam Vitals and nursing note reviewed.  Constitutional:      General: She is not in acute distress.    Appearance: She is well-developed.  HENT:     Head: Normocephalic and atraumatic.  Cardiovascular:     Pulses:  Dorsalis pedis pulses are 0 on the left side.       Posterior tibial pulses are 0 on the left side.  Pulmonary:     Effort: Pulmonary effort is normal. No respiratory distress.  Musculoskeletal:        General: Normal range of motion.       Feet:     Right Lower Extremity: Right leg is amputated below knee.  Feet:     Left foot:     Skin integrity: Ulcer and skin breakdown present.     Comments: Maceration between 2-3 and 3-4 toes. Toes warm, slightly swollen. 1.5 cm ulcer base of 1-2 toes Skin peeling from under all 5 toes; no drainage or odor, no bleeding Skin:    General: Skin is warm and dry.     Findings: No rash.  Neurological:     Mental Status: She is alert and oriented to person, place, and time.  Psychiatric:        Mood and Affect: Mood and affect normal.        Behavior: Behavior normal.        Thought Content: Thought content normal.     Wt Readings from Last 3 Encounters:  07/28/20 280 lb (127 kg)  06/30/20 286 lb (129.7 kg)  02/26/20 279 lb (126.6 kg)    BP 128/80   Pulse 76   Temp 98.5 F (36.9 C) (Oral)   Ht 5\' 9"  (1.753 m)   Wt 280 lb (127 kg)   SpO2 100%   BMI 41.35 kg/m   Assessment and Plan: 1. Cellulitis of toe of left foot Needs aggressive wound care - will refer to wound clinic Additional antibiotics with clindamycin Follow up with Vascular surgery as planned - pt will contact them to let them know about the current plan and see if they can  expedite wound clinic evaluation - clindamycin (CLEOCIN) 300 MG capsule; Take 1 capsule (300 mg total) by mouth 3 (three) times daily for 10 days.  Dispense: 30 capsule; Refill: 0 - Ambulatory referral to Wound Clinic   Partially dictated using Dragon software. Any errors are unintentional.  Halina Maidens, MD [facility]  07/28/2020

## 2020-08-03 ENCOUNTER — Telehealth: Payer: Self-pay

## 2020-08-03 NOTE — Telephone Encounter (Signed)
Patient did reach out to a [place] with an email, with the dates. Patient is emailing me the information on a Therapist, art from [place].

## 2020-08-03 NOTE — Telephone Encounter (Signed)
Copied from [place] [id number]. Topic: General - Other >> Aug 03, 2020  8:35 AM Celene Kras wrote: Reason for CRM: Pt called stating that the disability paperwork that was filled out by PCP is needing to be more specific with the dates of the pts disability. She states that her insurance is not accepting the paperwork how it was written. Pt states that the insurance company did send over a new fax with a request for a revision. She states that the paperwork still was not filled out correctly and is requesting to have this revised again. Please advise.

## 2020-08-04 NOTE — Telephone Encounter (Signed)
Pt calling stating that the company is faxing over paperwork today and is requesting to have [place] on the lookout. Please advise.

## 2020-08-04 NOTE — Telephone Encounter (Signed)
Ok. We are still waiting on the fax.

## 2020-08-04 NOTE — Telephone Encounter (Signed)
Pt called back requesting [facility] email address, send to [email or web address] insurance  Pt is requesting direct number for [place]. Pt is going to send [email or web address] to the insurance company (she vaguely remembers taking this down yesterday)

## 2020-08-05 NOTE — Telephone Encounter (Signed)
Still waiting on fax.

## 2020-08-09 NOTE — Telephone Encounter (Signed)
We have received the e-mail and I have routed it to Dr Gaspar Cola nurse.

## 2020-08-18 ENCOUNTER — Telehealth: Payer: Self-pay

## 2020-08-18 NOTE — Telephone Encounter (Unsigned)
Copied from [place] [id number]. Topic: General - Other >> Aug 18, 2020  9:32 AM Hinda Lenis D wrote: PT needs to speak with Estill Bamberg  about her insurance paperwork / please advise

## 2020-08-19 DIAGNOSIS — D631 Anemia in chronic kidney disease: Secondary | ICD-10-CM | POA: Diagnosis not present

## 2020-08-19 DIAGNOSIS — N185 Chronic kidney disease, stage 5: Secondary | ICD-10-CM | POA: Diagnosis not present

## 2020-08-19 DIAGNOSIS — E1121 Type 2 diabetes mellitus with diabetic nephropathy: Secondary | ICD-10-CM | POA: Diagnosis not present

## 2020-08-23 DIAGNOSIS — E1121 Type 2 diabetes mellitus with diabetic nephropathy: Secondary | ICD-10-CM | POA: Diagnosis not present

## 2020-08-23 DIAGNOSIS — I739 Peripheral vascular disease, unspecified: Secondary | ICD-10-CM | POA: Diagnosis not present

## 2020-08-23 DIAGNOSIS — Z794 Long term (current) use of insulin: Secondary | ICD-10-CM | POA: Diagnosis not present

## 2020-08-24 DIAGNOSIS — Z888 Allergy status to other drugs, medicaments and biological substances status: Secondary | ICD-10-CM | POA: Diagnosis not present

## 2020-08-24 DIAGNOSIS — Z89511 Acquired absence of right leg below knee: Secondary | ICD-10-CM | POA: Diagnosis not present

## 2020-08-24 DIAGNOSIS — Z7902 Long term (current) use of antithrombotics/antiplatelets: Secondary | ICD-10-CM | POA: Diagnosis not present

## 2020-08-24 DIAGNOSIS — E872 Acidosis: Secondary | ICD-10-CM | POA: Diagnosis not present

## 2020-08-24 DIAGNOSIS — E1169 Type 2 diabetes mellitus with other specified complication: Secondary | ICD-10-CM | POA: Diagnosis not present

## 2020-08-24 DIAGNOSIS — E1152 Type 2 diabetes mellitus with diabetic peripheral angiopathy with gangrene: Secondary | ICD-10-CM | POA: Diagnosis not present

## 2020-08-24 DIAGNOSIS — I1311 Hypertensive heart and chronic kidney disease without heart failure, with stage 5 chronic kidney disease, or end stage renal disease: Secondary | ICD-10-CM | POA: Diagnosis not present

## 2020-08-24 DIAGNOSIS — E871 Hypo-osmolality and hyponatremia: Secondary | ICD-10-CM | POA: Diagnosis not present

## 2020-08-24 DIAGNOSIS — Z20822 Contact with and (suspected) exposure to covid-19: Secondary | ICD-10-CM | POA: Diagnosis not present

## 2020-08-24 DIAGNOSIS — L97529 Non-pressure chronic ulcer of other part of left foot with unspecified severity: Secondary | ICD-10-CM | POA: Diagnosis not present

## 2020-08-24 DIAGNOSIS — N185 Chronic kidney disease, stage 5: Secondary | ICD-10-CM | POA: Diagnosis not present

## 2020-08-24 DIAGNOSIS — Z794 Long term (current) use of insulin: Secondary | ICD-10-CM | POA: Diagnosis not present

## 2020-08-24 DIAGNOSIS — Z882 Allergy status to sulfonamides status: Secondary | ICD-10-CM | POA: Diagnosis not present

## 2020-08-24 DIAGNOSIS — Z7982 Long term (current) use of aspirin: Secondary | ICD-10-CM | POA: Diagnosis not present

## 2020-08-24 DIAGNOSIS — I739 Peripheral vascular disease, unspecified: Secondary | ICD-10-CM | POA: Diagnosis not present

## 2020-08-24 DIAGNOSIS — I120 Hypertensive chronic kidney disease with stage 5 chronic kidney disease or end stage renal disease: Secondary | ICD-10-CM | POA: Diagnosis not present

## 2020-08-24 DIAGNOSIS — E1122 Type 2 diabetes mellitus with diabetic chronic kidney disease: Secondary | ICD-10-CM | POA: Diagnosis not present

## 2020-08-24 DIAGNOSIS — M869 Osteomyelitis, unspecified: Secondary | ICD-10-CM | POA: Diagnosis not present

## 2020-08-24 DIAGNOSIS — E11621 Type 2 diabetes mellitus with foot ulcer: Secondary | ICD-10-CM | POA: Diagnosis not present

## 2020-08-25 DIAGNOSIS — G8918 Other acute postprocedural pain: Secondary | ICD-10-CM | POA: Diagnosis not present

## 2020-08-25 DIAGNOSIS — M869 Osteomyelitis, unspecified: Secondary | ICD-10-CM | POA: Diagnosis not present

## 2020-08-25 DIAGNOSIS — I739 Peripheral vascular disease, unspecified: Secondary | ICD-10-CM | POA: Diagnosis not present

## 2020-08-25 DIAGNOSIS — M79672 Pain in left foot: Secondary | ICD-10-CM | POA: Diagnosis not present

## 2020-08-26 DIAGNOSIS — N185 Chronic kidney disease, stage 5: Secondary | ICD-10-CM | POA: Diagnosis not present

## 2020-08-26 DIAGNOSIS — I1311 Hypertensive heart and chronic kidney disease without heart failure, with stage 5 chronic kidney disease, or end stage renal disease: Secondary | ICD-10-CM | POA: Diagnosis not present

## 2020-08-26 DIAGNOSIS — E1122 Type 2 diabetes mellitus with diabetic chronic kidney disease: Secondary | ICD-10-CM | POA: Diagnosis not present

## 2020-08-26 DIAGNOSIS — E872 Acidosis: Secondary | ICD-10-CM | POA: Diagnosis not present

## 2020-08-27 DIAGNOSIS — N185 Chronic kidney disease, stage 5: Secondary | ICD-10-CM | POA: Diagnosis not present

## 2020-08-27 DIAGNOSIS — I1311 Hypertensive heart and chronic kidney disease without heart failure, with stage 5 chronic kidney disease, or end stage renal disease: Secondary | ICD-10-CM | POA: Diagnosis not present

## 2020-08-27 DIAGNOSIS — E872 Acidosis: Secondary | ICD-10-CM | POA: Diagnosis not present

## 2020-08-27 DIAGNOSIS — E1122 Type 2 diabetes mellitus with diabetic chronic kidney disease: Secondary | ICD-10-CM | POA: Diagnosis not present

## 2020-09-02 DIAGNOSIS — E1122 Type 2 diabetes mellitus with diabetic chronic kidney disease: Secondary | ICD-10-CM | POA: Diagnosis not present

## 2020-09-02 DIAGNOSIS — N2581 Secondary hyperparathyroidism of renal origin: Secondary | ICD-10-CM | POA: Diagnosis not present

## 2020-09-02 DIAGNOSIS — E669 Obesity, unspecified: Secondary | ICD-10-CM | POA: Diagnosis not present

## 2020-09-02 DIAGNOSIS — I132 Hypertensive heart and chronic kidney disease with heart failure and with stage 5 chronic kidney disease, or end stage renal disease: Secondary | ICD-10-CM | POA: Diagnosis not present

## 2020-09-02 DIAGNOSIS — N185 Chronic kidney disease, stage 5: Secondary | ICD-10-CM | POA: Diagnosis not present

## 2020-09-02 DIAGNOSIS — Z4781 Encounter for orthopedic aftercare following surgical amputation: Secondary | ICD-10-CM | POA: Diagnosis not present

## 2020-09-02 DIAGNOSIS — E7800 Pure hypercholesterolemia, unspecified: Secondary | ICD-10-CM | POA: Diagnosis not present

## 2020-09-02 DIAGNOSIS — D631 Anemia in chronic kidney disease: Secondary | ICD-10-CM | POA: Diagnosis not present

## 2020-09-02 DIAGNOSIS — Z794 Long term (current) use of insulin: Secondary | ICD-10-CM | POA: Diagnosis not present

## 2020-09-02 DIAGNOSIS — Z4801 Encounter for change or removal of surgical wound dressing: Secondary | ICD-10-CM | POA: Diagnosis not present

## 2020-09-02 DIAGNOSIS — I509 Heart failure, unspecified: Secondary | ICD-10-CM | POA: Diagnosis not present

## 2020-09-02 DIAGNOSIS — E11319 Type 2 diabetes mellitus with unspecified diabetic retinopathy without macular edema: Secondary | ICD-10-CM | POA: Diagnosis not present

## 2020-09-02 DIAGNOSIS — G4733 Obstructive sleep apnea (adult) (pediatric): Secondary | ICD-10-CM | POA: Diagnosis not present

## 2020-09-02 DIAGNOSIS — E1151 Type 2 diabetes mellitus with diabetic peripheral angiopathy without gangrene: Secondary | ICD-10-CM | POA: Diagnosis not present

## 2020-09-02 DIAGNOSIS — Z7982 Long term (current) use of aspirin: Secondary | ICD-10-CM | POA: Diagnosis not present

## 2020-09-02 DIAGNOSIS — Z7951 Long term (current) use of inhaled steroids: Secondary | ICD-10-CM | POA: Diagnosis not present

## 2020-09-06 DIAGNOSIS — I120 Hypertensive chronic kidney disease with stage 5 chronic kidney disease or end stage renal disease: Secondary | ICD-10-CM | POA: Diagnosis not present

## 2020-09-06 DIAGNOSIS — Z6841 Body Mass Index (BMI) 40.0 and over, adult: Secondary | ICD-10-CM | POA: Diagnosis not present

## 2020-09-06 DIAGNOSIS — Z89511 Acquired absence of right leg below knee: Secondary | ICD-10-CM | POA: Diagnosis not present

## 2020-09-06 DIAGNOSIS — Z20822 Contact with and (suspected) exposure to covid-19: Secondary | ICD-10-CM | POA: Diagnosis not present

## 2020-09-06 DIAGNOSIS — I739 Peripheral vascular disease, unspecified: Secondary | ICD-10-CM | POA: Diagnosis not present

## 2020-09-06 DIAGNOSIS — L03116 Cellulitis of left lower limb: Secondary | ICD-10-CM | POA: Diagnosis not present

## 2020-09-06 DIAGNOSIS — E1122 Type 2 diabetes mellitus with diabetic chronic kidney disease: Secondary | ICD-10-CM | POA: Diagnosis not present

## 2020-09-06 DIAGNOSIS — Z882 Allergy status to sulfonamides status: Secondary | ICD-10-CM | POA: Diagnosis not present

## 2020-09-06 DIAGNOSIS — E875 Hyperkalemia: Secondary | ICD-10-CM | POA: Diagnosis not present

## 2020-09-06 DIAGNOSIS — N185 Chronic kidney disease, stage 5: Secondary | ICD-10-CM | POA: Diagnosis not present

## 2020-09-06 DIAGNOSIS — D649 Anemia, unspecified: Secondary | ICD-10-CM | POA: Diagnosis not present

## 2020-09-06 DIAGNOSIS — E669 Obesity, unspecified: Secondary | ICD-10-CM | POA: Diagnosis not present

## 2020-09-06 DIAGNOSIS — E1151 Type 2 diabetes mellitus with diabetic peripheral angiopathy without gangrene: Secondary | ICD-10-CM | POA: Diagnosis not present

## 2020-09-06 DIAGNOSIS — E871 Hypo-osmolality and hyponatremia: Secondary | ICD-10-CM | POA: Diagnosis not present

## 2020-09-06 DIAGNOSIS — E785 Hyperlipidemia, unspecified: Secondary | ICD-10-CM | POA: Diagnosis not present

## 2020-09-06 DIAGNOSIS — T8744 Infection of amputation stump, left lower extremity: Secondary | ICD-10-CM | POA: Diagnosis not present

## 2020-09-07 DIAGNOSIS — E872 Acidosis: Secondary | ICD-10-CM | POA: Diagnosis not present

## 2020-09-07 DIAGNOSIS — N185 Chronic kidney disease, stage 5: Secondary | ICD-10-CM | POA: Diagnosis not present

## 2020-09-07 DIAGNOSIS — I120 Hypertensive chronic kidney disease with stage 5 chronic kidney disease or end stage renal disease: Secondary | ICD-10-CM | POA: Diagnosis not present

## 2020-09-07 DIAGNOSIS — E1122 Type 2 diabetes mellitus with diabetic chronic kidney disease: Secondary | ICD-10-CM | POA: Diagnosis not present

## 2020-09-08 DIAGNOSIS — E872 Acidosis: Secondary | ICD-10-CM | POA: Diagnosis not present

## 2020-09-08 DIAGNOSIS — I7389 Other specified peripheral vascular diseases: Secondary | ICD-10-CM | POA: Diagnosis not present

## 2020-09-08 DIAGNOSIS — T8754 Necrosis of amputation stump, left lower extremity: Secondary | ICD-10-CM | POA: Diagnosis not present

## 2020-09-08 DIAGNOSIS — N185 Chronic kidney disease, stage 5: Secondary | ICD-10-CM | POA: Diagnosis not present

## 2020-09-08 DIAGNOSIS — G8918 Other acute postprocedural pain: Secondary | ICD-10-CM | POA: Diagnosis not present

## 2020-09-08 DIAGNOSIS — T8744 Infection of amputation stump, left lower extremity: Secondary | ICD-10-CM | POA: Diagnosis not present

## 2020-09-08 DIAGNOSIS — E1122 Type 2 diabetes mellitus with diabetic chronic kidney disease: Secondary | ICD-10-CM | POA: Diagnosis not present

## 2020-09-08 DIAGNOSIS — M79605 Pain in left leg: Secondary | ICD-10-CM | POA: Diagnosis not present

## 2020-09-08 DIAGNOSIS — I739 Peripheral vascular disease, unspecified: Secondary | ICD-10-CM | POA: Diagnosis not present

## 2020-09-08 DIAGNOSIS — I120 Hypertensive chronic kidney disease with stage 5 chronic kidney disease or end stage renal disease: Secondary | ICD-10-CM | POA: Diagnosis not present

## 2020-09-08 HISTORY — PX: LEG AMPUTATION BELOW KNEE: SHX694

## 2020-09-09 DIAGNOSIS — E1122 Type 2 diabetes mellitus with diabetic chronic kidney disease: Secondary | ICD-10-CM | POA: Diagnosis not present

## 2020-09-09 DIAGNOSIS — I120 Hypertensive chronic kidney disease with stage 5 chronic kidney disease or end stage renal disease: Secondary | ICD-10-CM | POA: Diagnosis not present

## 2020-09-09 DIAGNOSIS — E872 Acidosis: Secondary | ICD-10-CM | POA: Diagnosis not present

## 2020-09-09 DIAGNOSIS — N185 Chronic kidney disease, stage 5: Secondary | ICD-10-CM | POA: Diagnosis not present

## 2020-09-10 DIAGNOSIS — E875 Hyperkalemia: Secondary | ICD-10-CM | POA: Diagnosis not present

## 2020-09-10 DIAGNOSIS — N185 Chronic kidney disease, stage 5: Secondary | ICD-10-CM | POA: Diagnosis not present

## 2020-09-10 DIAGNOSIS — E1122 Type 2 diabetes mellitus with diabetic chronic kidney disease: Secondary | ICD-10-CM | POA: Diagnosis not present

## 2020-09-10 DIAGNOSIS — I120 Hypertensive chronic kidney disease with stage 5 chronic kidney disease or end stage renal disease: Secondary | ICD-10-CM | POA: Diagnosis not present

## 2020-09-11 DIAGNOSIS — I120 Hypertensive chronic kidney disease with stage 5 chronic kidney disease or end stage renal disease: Secondary | ICD-10-CM | POA: Diagnosis not present

## 2020-09-11 DIAGNOSIS — E875 Hyperkalemia: Secondary | ICD-10-CM | POA: Diagnosis not present

## 2020-09-11 DIAGNOSIS — E1122 Type 2 diabetes mellitus with diabetic chronic kidney disease: Secondary | ICD-10-CM | POA: Diagnosis not present

## 2020-09-11 DIAGNOSIS — N185 Chronic kidney disease, stage 5: Secondary | ICD-10-CM | POA: Diagnosis not present

## 2020-09-12 DIAGNOSIS — N185 Chronic kidney disease, stage 5: Secondary | ICD-10-CM | POA: Diagnosis not present

## 2020-09-12 DIAGNOSIS — I120 Hypertensive chronic kidney disease with stage 5 chronic kidney disease or end stage renal disease: Secondary | ICD-10-CM | POA: Diagnosis not present

## 2020-09-12 DIAGNOSIS — E875 Hyperkalemia: Secondary | ICD-10-CM | POA: Diagnosis not present

## 2020-09-12 DIAGNOSIS — E1122 Type 2 diabetes mellitus with diabetic chronic kidney disease: Secondary | ICD-10-CM | POA: Diagnosis not present

## 2020-09-13 ENCOUNTER — Other Ambulatory Visit (INDEPENDENT_AMBULATORY_CARE_PROVIDER_SITE_OTHER): Payer: Federal, State, Local not specified - PPO | Admitting: Internal Medicine

## 2020-09-13 DIAGNOSIS — I130 Hypertensive heart and chronic kidney disease with heart failure and stage 1 through stage 4 chronic kidney disease, or unspecified chronic kidney disease: Secondary | ICD-10-CM

## 2020-09-13 DIAGNOSIS — N185 Chronic kidney disease, stage 5: Secondary | ICD-10-CM

## 2020-09-13 DIAGNOSIS — E118 Type 2 diabetes mellitus with unspecified complications: Secondary | ICD-10-CM

## 2020-09-13 DIAGNOSIS — I120 Hypertensive chronic kidney disease with stage 5 chronic kidney disease or end stage renal disease: Secondary | ICD-10-CM | POA: Diagnosis not present

## 2020-09-13 DIAGNOSIS — E1122 Type 2 diabetes mellitus with diabetic chronic kidney disease: Secondary | ICD-10-CM

## 2020-09-13 DIAGNOSIS — E11319 Type 2 diabetes mellitus with unspecified diabetic retinopathy without macular edema: Secondary | ICD-10-CM

## 2020-09-13 DIAGNOSIS — Z7902 Long term (current) use of antithrombotics/antiplatelets: Secondary | ICD-10-CM

## 2020-09-13 DIAGNOSIS — Z4801 Encounter for change or removal of surgical wound dressing: Secondary | ICD-10-CM | POA: Diagnosis not present

## 2020-09-13 DIAGNOSIS — Z89432 Acquired absence of left foot: Secondary | ICD-10-CM

## 2020-09-13 DIAGNOSIS — E1151 Type 2 diabetes mellitus with diabetic peripheral angiopathy without gangrene: Secondary | ICD-10-CM

## 2020-09-13 DIAGNOSIS — Z794 Long term (current) use of insulin: Secondary | ICD-10-CM

## 2020-09-13 DIAGNOSIS — E875 Hyperkalemia: Secondary | ICD-10-CM | POA: Diagnosis not present

## 2020-09-13 DIAGNOSIS — Z9181 History of falling: Secondary | ICD-10-CM

## 2020-09-13 DIAGNOSIS — E1169 Type 2 diabetes mellitus with other specified complication: Secondary | ICD-10-CM

## 2020-09-13 DIAGNOSIS — I10 Essential (primary) hypertension: Secondary | ICD-10-CM

## 2020-09-13 DIAGNOSIS — E785 Hyperlipidemia, unspecified: Secondary | ICD-10-CM

## 2020-09-13 DIAGNOSIS — Z4781 Encounter for orthopedic aftercare following surgical amputation: Secondary | ICD-10-CM

## 2020-09-13 DIAGNOSIS — Z7982 Long term (current) use of aspirin: Secondary | ICD-10-CM

## 2020-09-13 DIAGNOSIS — I739 Peripheral vascular disease, unspecified: Secondary | ICD-10-CM

## 2020-09-13 DIAGNOSIS — E1165 Type 2 diabetes mellitus with hyperglycemia: Secondary | ICD-10-CM

## 2020-09-13 DIAGNOSIS — Z7951 Long term (current) use of inhaled steroids: Secondary | ICD-10-CM

## 2020-09-13 DIAGNOSIS — Z89511 Acquired absence of right leg below knee: Secondary | ICD-10-CM

## 2020-09-13 DIAGNOSIS — IMO0002 Reserved for concepts with insufficient information to code with codable children: Secondary | ICD-10-CM

## 2020-09-13 NOTE — Progress Notes (Signed)
Received orders from [facility]. Start of care 09/02/20.   Orders from 09/02/20 through 10/31/20 are reviewed, signed and faxed.

## 2020-09-14 DIAGNOSIS — I120 Hypertensive chronic kidney disease with stage 5 chronic kidney disease or end stage renal disease: Secondary | ICD-10-CM | POA: Diagnosis not present

## 2020-09-14 DIAGNOSIS — N185 Chronic kidney disease, stage 5: Secondary | ICD-10-CM | POA: Diagnosis not present

## 2020-09-14 DIAGNOSIS — E1122 Type 2 diabetes mellitus with diabetic chronic kidney disease: Secondary | ICD-10-CM | POA: Diagnosis not present

## 2020-09-14 DIAGNOSIS — E875 Hyperkalemia: Secondary | ICD-10-CM | POA: Diagnosis not present

## 2020-09-15 DIAGNOSIS — I120 Hypertensive chronic kidney disease with stage 5 chronic kidney disease or end stage renal disease: Secondary | ICD-10-CM | POA: Diagnosis not present

## 2020-09-15 DIAGNOSIS — E872 Acidosis: Secondary | ICD-10-CM | POA: Diagnosis not present

## 2020-09-15 DIAGNOSIS — E1122 Type 2 diabetes mellitus with diabetic chronic kidney disease: Secondary | ICD-10-CM | POA: Diagnosis not present

## 2020-09-15 DIAGNOSIS — N185 Chronic kidney disease, stage 5: Secondary | ICD-10-CM | POA: Diagnosis not present

## 2020-09-16 DIAGNOSIS — E872 Acidosis: Secondary | ICD-10-CM | POA: Diagnosis not present

## 2020-09-16 DIAGNOSIS — N185 Chronic kidney disease, stage 5: Secondary | ICD-10-CM | POA: Diagnosis not present

## 2020-09-16 DIAGNOSIS — I120 Hypertensive chronic kidney disease with stage 5 chronic kidney disease or end stage renal disease: Secondary | ICD-10-CM | POA: Diagnosis not present

## 2020-09-16 DIAGNOSIS — E1122 Type 2 diabetes mellitus with diabetic chronic kidney disease: Secondary | ICD-10-CM | POA: Diagnosis not present

## 2020-09-17 DIAGNOSIS — Z89511 Acquired absence of right leg below knee: Secondary | ICD-10-CM | POA: Diagnosis not present

## 2020-09-17 DIAGNOSIS — R52 Pain, unspecified: Secondary | ICD-10-CM | POA: Diagnosis not present

## 2020-09-17 DIAGNOSIS — E669 Obesity, unspecified: Secondary | ICD-10-CM | POA: Diagnosis not present

## 2020-09-17 DIAGNOSIS — N185 Chronic kidney disease, stage 5: Secondary | ICD-10-CM | POA: Diagnosis not present

## 2020-09-17 DIAGNOSIS — K5900 Constipation, unspecified: Secondary | ICD-10-CM | POA: Diagnosis not present

## 2020-09-17 DIAGNOSIS — Z4781 Encounter for orthopedic aftercare following surgical amputation: Secondary | ICD-10-CM | POA: Diagnosis not present

## 2020-09-17 DIAGNOSIS — E872 Acidosis: Secondary | ICD-10-CM | POA: Diagnosis not present

## 2020-09-17 DIAGNOSIS — E1122 Type 2 diabetes mellitus with diabetic chronic kidney disease: Secondary | ICD-10-CM | POA: Diagnosis not present

## 2020-09-17 DIAGNOSIS — Z20822 Contact with and (suspected) exposure to covid-19: Secondary | ICD-10-CM | POA: Diagnosis not present

## 2020-09-17 DIAGNOSIS — D631 Anemia in chronic kidney disease: Secondary | ICD-10-CM | POA: Diagnosis not present

## 2020-09-17 DIAGNOSIS — Z6841 Body Mass Index (BMI) 40.0 and over, adult: Secondary | ICD-10-CM | POA: Diagnosis not present

## 2020-09-17 DIAGNOSIS — E7800 Pure hypercholesterolemia, unspecified: Secondary | ICD-10-CM | POA: Diagnosis not present

## 2020-09-17 DIAGNOSIS — E11319 Type 2 diabetes mellitus with unspecified diabetic retinopathy without macular edema: Secondary | ICD-10-CM | POA: Diagnosis not present

## 2020-09-17 DIAGNOSIS — Z89512 Acquired absence of left leg below knee: Secondary | ICD-10-CM | POA: Diagnosis not present

## 2020-09-17 DIAGNOSIS — I120 Hypertensive chronic kidney disease with stage 5 chronic kidney disease or end stage renal disease: Secondary | ICD-10-CM | POA: Diagnosis not present

## 2020-09-17 DIAGNOSIS — E875 Hyperkalemia: Secondary | ICD-10-CM | POA: Diagnosis not present

## 2020-09-18 DIAGNOSIS — I120 Hypertensive chronic kidney disease with stage 5 chronic kidney disease or end stage renal disease: Secondary | ICD-10-CM | POA: Diagnosis not present

## 2020-09-18 DIAGNOSIS — E1122 Type 2 diabetes mellitus with diabetic chronic kidney disease: Secondary | ICD-10-CM | POA: Diagnosis not present

## 2020-09-18 DIAGNOSIS — Z89512 Acquired absence of left leg below knee: Secondary | ICD-10-CM | POA: Diagnosis not present

## 2020-09-18 DIAGNOSIS — N185 Chronic kidney disease, stage 5: Secondary | ICD-10-CM | POA: Diagnosis not present

## 2020-09-18 DIAGNOSIS — E872 Acidosis: Secondary | ICD-10-CM | POA: Diagnosis not present

## 2020-09-19 DIAGNOSIS — I120 Hypertensive chronic kidney disease with stage 5 chronic kidney disease or end stage renal disease: Secondary | ICD-10-CM | POA: Diagnosis not present

## 2020-09-19 DIAGNOSIS — E872 Acidosis: Secondary | ICD-10-CM | POA: Diagnosis not present

## 2020-09-19 DIAGNOSIS — E1122 Type 2 diabetes mellitus with diabetic chronic kidney disease: Secondary | ICD-10-CM | POA: Diagnosis not present

## 2020-09-19 DIAGNOSIS — Z89512 Acquired absence of left leg below knee: Secondary | ICD-10-CM | POA: Diagnosis not present

## 2020-09-19 DIAGNOSIS — N185 Chronic kidney disease, stage 5: Secondary | ICD-10-CM | POA: Diagnosis not present

## 2020-09-20 DIAGNOSIS — Z89512 Acquired absence of left leg below knee: Secondary | ICD-10-CM | POA: Diagnosis not present

## 2020-09-20 DIAGNOSIS — E1122 Type 2 diabetes mellitus with diabetic chronic kidney disease: Secondary | ICD-10-CM | POA: Diagnosis not present

## 2020-09-20 DIAGNOSIS — I120 Hypertensive chronic kidney disease with stage 5 chronic kidney disease or end stage renal disease: Secondary | ICD-10-CM | POA: Diagnosis not present

## 2020-09-20 DIAGNOSIS — E875 Hyperkalemia: Secondary | ICD-10-CM | POA: Diagnosis not present

## 2020-09-20 DIAGNOSIS — N185 Chronic kidney disease, stage 5: Secondary | ICD-10-CM | POA: Diagnosis not present

## 2020-09-21 DIAGNOSIS — I120 Hypertensive chronic kidney disease with stage 5 chronic kidney disease or end stage renal disease: Secondary | ICD-10-CM | POA: Diagnosis not present

## 2020-09-21 DIAGNOSIS — N185 Chronic kidney disease, stage 5: Secondary | ICD-10-CM | POA: Diagnosis not present

## 2020-09-21 DIAGNOSIS — Z89512 Acquired absence of left leg below knee: Secondary | ICD-10-CM | POA: Diagnosis not present

## 2020-09-21 DIAGNOSIS — E1122 Type 2 diabetes mellitus with diabetic chronic kidney disease: Secondary | ICD-10-CM | POA: Diagnosis not present

## 2020-09-21 DIAGNOSIS — E875 Hyperkalemia: Secondary | ICD-10-CM | POA: Diagnosis not present

## 2020-09-22 DIAGNOSIS — I120 Hypertensive chronic kidney disease with stage 5 chronic kidney disease or end stage renal disease: Secondary | ICD-10-CM | POA: Diagnosis not present

## 2020-09-22 DIAGNOSIS — Z89512 Acquired absence of left leg below knee: Secondary | ICD-10-CM | POA: Diagnosis not present

## 2020-09-22 DIAGNOSIS — E1122 Type 2 diabetes mellitus with diabetic chronic kidney disease: Secondary | ICD-10-CM | POA: Diagnosis not present

## 2020-09-22 DIAGNOSIS — E872 Acidosis: Secondary | ICD-10-CM | POA: Diagnosis not present

## 2020-09-22 DIAGNOSIS — N185 Chronic kidney disease, stage 5: Secondary | ICD-10-CM | POA: Diagnosis not present

## 2020-09-23 DIAGNOSIS — E872 Acidosis: Secondary | ICD-10-CM | POA: Diagnosis not present

## 2020-09-23 DIAGNOSIS — I120 Hypertensive chronic kidney disease with stage 5 chronic kidney disease or end stage renal disease: Secondary | ICD-10-CM | POA: Diagnosis not present

## 2020-09-23 DIAGNOSIS — N185 Chronic kidney disease, stage 5: Secondary | ICD-10-CM | POA: Diagnosis not present

## 2020-09-23 DIAGNOSIS — Z89512 Acquired absence of left leg below knee: Secondary | ICD-10-CM | POA: Diagnosis not present

## 2020-09-23 DIAGNOSIS — E1122 Type 2 diabetes mellitus with diabetic chronic kidney disease: Secondary | ICD-10-CM | POA: Diagnosis not present

## 2020-09-24 DIAGNOSIS — I120 Hypertensive chronic kidney disease with stage 5 chronic kidney disease or end stage renal disease: Secondary | ICD-10-CM | POA: Diagnosis not present

## 2020-09-24 DIAGNOSIS — E872 Acidosis: Secondary | ICD-10-CM | POA: Diagnosis not present

## 2020-09-24 DIAGNOSIS — N185 Chronic kidney disease, stage 5: Secondary | ICD-10-CM | POA: Diagnosis not present

## 2020-09-24 DIAGNOSIS — Z89512 Acquired absence of left leg below knee: Secondary | ICD-10-CM | POA: Diagnosis not present

## 2020-09-24 DIAGNOSIS — E1122 Type 2 diabetes mellitus with diabetic chronic kidney disease: Secondary | ICD-10-CM | POA: Diagnosis not present

## 2020-09-25 DIAGNOSIS — Z89512 Acquired absence of left leg below knee: Secondary | ICD-10-CM | POA: Diagnosis not present

## 2020-09-26 DIAGNOSIS — Z89512 Acquired absence of left leg below knee: Secondary | ICD-10-CM | POA: Diagnosis not present

## 2020-09-27 DIAGNOSIS — Z89512 Acquired absence of left leg below knee: Secondary | ICD-10-CM | POA: Diagnosis not present

## 2020-09-28 ENCOUNTER — Telehealth: Payer: Self-pay

## 2020-09-28 DIAGNOSIS — Z89512 Acquired absence of left leg below knee: Secondary | ICD-10-CM | POA: Diagnosis not present

## 2020-09-28 NOTE — Telephone Encounter (Signed)
Called, left message

## 2020-09-28 NOTE — Telephone Encounter (Signed)
No hospital follow up appointment within 14 days available  Copied from [place] [phone]. Topic: Appointment Scheduling - Scheduling Inquiry for Clinic >> Sep 28, 2020  8:30 AM Loma Boston wrote: CRM for notification. See Telephone encounter for: 09/28/20.[facility] calling re pt as to a HFU visit for a BK amputation and wanting a FU 10 to 14 days out, no appt hfu available, called Estill Bamberg and requested for a crm FU . Please fu with pt re fu at [phone]

## 2020-09-30 DIAGNOSIS — E669 Obesity, unspecified: Secondary | ICD-10-CM | POA: Diagnosis not present

## 2020-09-30 DIAGNOSIS — G4733 Obstructive sleep apnea (adult) (pediatric): Secondary | ICD-10-CM | POA: Diagnosis not present

## 2020-09-30 DIAGNOSIS — I132 Hypertensive heart and chronic kidney disease with heart failure and with stage 5 chronic kidney disease, or end stage renal disease: Secondary | ICD-10-CM | POA: Diagnosis not present

## 2020-09-30 DIAGNOSIS — N185 Chronic kidney disease, stage 5: Secondary | ICD-10-CM | POA: Diagnosis not present

## 2020-09-30 DIAGNOSIS — E1122 Type 2 diabetes mellitus with diabetic chronic kidney disease: Secondary | ICD-10-CM | POA: Diagnosis not present

## 2020-09-30 DIAGNOSIS — Z7982 Long term (current) use of aspirin: Secondary | ICD-10-CM | POA: Diagnosis not present

## 2020-09-30 DIAGNOSIS — I509 Heart failure, unspecified: Secondary | ICD-10-CM | POA: Diagnosis not present

## 2020-09-30 DIAGNOSIS — N2581 Secondary hyperparathyroidism of renal origin: Secondary | ICD-10-CM | POA: Diagnosis not present

## 2020-09-30 DIAGNOSIS — D631 Anemia in chronic kidney disease: Secondary | ICD-10-CM | POA: Diagnosis not present

## 2020-09-30 DIAGNOSIS — Z794 Long term (current) use of insulin: Secondary | ICD-10-CM | POA: Diagnosis not present

## 2020-09-30 DIAGNOSIS — E7800 Pure hypercholesterolemia, unspecified: Secondary | ICD-10-CM | POA: Diagnosis not present

## 2020-09-30 DIAGNOSIS — Z4781 Encounter for orthopedic aftercare following surgical amputation: Secondary | ICD-10-CM | POA: Diagnosis not present

## 2020-09-30 DIAGNOSIS — E11319 Type 2 diabetes mellitus with unspecified diabetic retinopathy without macular edema: Secondary | ICD-10-CM | POA: Diagnosis not present

## 2020-09-30 DIAGNOSIS — E1151 Type 2 diabetes mellitus with diabetic peripheral angiopathy without gangrene: Secondary | ICD-10-CM | POA: Diagnosis not present

## 2020-09-30 DIAGNOSIS — Z7951 Long term (current) use of inhaled steroids: Secondary | ICD-10-CM | POA: Diagnosis not present

## 2020-09-30 DIAGNOSIS — Z4801 Encounter for change or removal of surgical wound dressing: Secondary | ICD-10-CM | POA: Diagnosis not present

## 2020-10-06 DIAGNOSIS — I10 Essential (primary) hypertension: Secondary | ICD-10-CM | POA: Diagnosis not present

## 2020-10-06 DIAGNOSIS — Z89511 Acquired absence of right leg below knee: Secondary | ICD-10-CM | POA: Diagnosis not present

## 2020-10-06 DIAGNOSIS — Z89512 Acquired absence of left leg below knee: Secondary | ICD-10-CM | POA: Diagnosis not present

## 2020-10-06 DIAGNOSIS — E1065 Type 1 diabetes mellitus with hyperglycemia: Secondary | ICD-10-CM | POA: Diagnosis not present

## 2020-10-06 DIAGNOSIS — Z6841 Body Mass Index (BMI) 40.0 and over, adult: Secondary | ICD-10-CM | POA: Diagnosis not present

## 2020-10-07 ENCOUNTER — Ambulatory Visit: Payer: Self-pay | Admitting: *Deleted

## 2020-10-07 DIAGNOSIS — I132 Hypertensive heart and chronic kidney disease with heart failure and with stage 5 chronic kidney disease, or end stage renal disease: Secondary | ICD-10-CM | POA: Diagnosis not present

## 2020-10-07 DIAGNOSIS — E1122 Type 2 diabetes mellitus with diabetic chronic kidney disease: Secondary | ICD-10-CM | POA: Diagnosis not present

## 2020-10-07 DIAGNOSIS — Z4801 Encounter for change or removal of surgical wound dressing: Secondary | ICD-10-CM | POA: Diagnosis not present

## 2020-10-07 DIAGNOSIS — I509 Heart failure, unspecified: Secondary | ICD-10-CM | POA: Diagnosis not present

## 2020-10-07 DIAGNOSIS — Z7951 Long term (current) use of inhaled steroids: Secondary | ICD-10-CM | POA: Diagnosis not present

## 2020-10-07 DIAGNOSIS — E7800 Pure hypercholesterolemia, unspecified: Secondary | ICD-10-CM | POA: Diagnosis not present

## 2020-10-07 DIAGNOSIS — Z794 Long term (current) use of insulin: Secondary | ICD-10-CM | POA: Diagnosis not present

## 2020-10-07 DIAGNOSIS — E11319 Type 2 diabetes mellitus with unspecified diabetic retinopathy without macular edema: Secondary | ICD-10-CM | POA: Diagnosis not present

## 2020-10-07 DIAGNOSIS — Z4781 Encounter for orthopedic aftercare following surgical amputation: Secondary | ICD-10-CM | POA: Diagnosis not present

## 2020-10-07 DIAGNOSIS — N185 Chronic kidney disease, stage 5: Secondary | ICD-10-CM | POA: Diagnosis not present

## 2020-10-07 DIAGNOSIS — D631 Anemia in chronic kidney disease: Secondary | ICD-10-CM | POA: Diagnosis not present

## 2020-10-07 DIAGNOSIS — E669 Obesity, unspecified: Secondary | ICD-10-CM | POA: Diagnosis not present

## 2020-10-07 DIAGNOSIS — G4733 Obstructive sleep apnea (adult) (pediatric): Secondary | ICD-10-CM | POA: Diagnosis not present

## 2020-10-07 DIAGNOSIS — E1151 Type 2 diabetes mellitus with diabetic peripheral angiopathy without gangrene: Secondary | ICD-10-CM | POA: Diagnosis not present

## 2020-10-07 DIAGNOSIS — N2581 Secondary hyperparathyroidism of renal origin: Secondary | ICD-10-CM | POA: Diagnosis not present

## 2020-10-07 DIAGNOSIS — Z7982 Long term (current) use of aspirin: Secondary | ICD-10-CM | POA: Diagnosis not present

## 2020-10-07 NOTE — Telephone Encounter (Signed)
Follow-up:  call for B/P. Now 177/90 HR 71 bpm. Continues to deny pain, CP,SOB, dizziness, weakness/vision changes, HA.

## 2020-10-07 NOTE — Telephone Encounter (Signed)
Spoke to pt let her know that she would need to call her nephrologists. Pt verbalized understanding.  KP

## 2020-10-07 NOTE — Telephone Encounter (Signed)
Home Health OT visiting with patient. Reporting B/P 177/ and 188/96 this morning. Denies CP/SOB/dizziness/one-sided weakness/HA. Denies vision changes and is not having any pain. She is S/P below knee amputation from several weeks ago. Had OV with vascular yesterday B/P was 177 immediately following a chair transfer. Was not rechecked.  Patient takes antihypertensives as directed. While hospitalzied for BKA surgery and upon discharge benazepril 40 mg tabs were discontinued she reported. Care Advice: increase daily water intake now and today. Placing on callback in one hour, 10:15a. Reviewed with patient if she experiences CP, SOB, dizziness, one-sided weakness, severe HA, vision changes to call 911 immediately.  Reason for Disposition . Systolic BP  >= 99991111 OR Diastolic >= A999333  Answer Assessment - Initial Assessment Questions 1. BLOOD PRESSURE: "What is the blood pressure?" "Did you take at least two measurements 5 minutes apart?"     188/96 and 177/84   2. ONSET: "When did you take your blood pressure?"     This morning 3. HOW: "How did you obtain the blood pressure?" (e.g., visiting nurse, automatic home BP monitor)     Home Health visiting her home  4. HISTORY: "Do you have a history of high blood pressure?"     yes 5. MEDICATIONS: "Are you taking any medications for blood pressure?" "Have you missed any doses recently?"     Yes taking medications and have not missed any. 6. OTHER SYMPTOMS: "Do you have any symptoms?" (e.g., headache, chest pain, blurred vision, difficulty breathing, weakness)     Denies any symptoms. 7. PREGNANCY: "Is there any chance you are pregnant?" "When was your last menstrual period?"     na  Protocols used: BLOOD PRESSURE - HIGH-A-AH

## 2020-10-07 NOTE — Telephone Encounter (Signed)
She needs to call her nephrologist who is managing her HTN in view of her stage V renal diseaes.

## 2020-10-09 DIAGNOSIS — Z7951 Long term (current) use of inhaled steroids: Secondary | ICD-10-CM | POA: Diagnosis not present

## 2020-10-09 DIAGNOSIS — E1122 Type 2 diabetes mellitus with diabetic chronic kidney disease: Secondary | ICD-10-CM | POA: Diagnosis not present

## 2020-10-09 DIAGNOSIS — D631 Anemia in chronic kidney disease: Secondary | ICD-10-CM | POA: Diagnosis not present

## 2020-10-09 DIAGNOSIS — E669 Obesity, unspecified: Secondary | ICD-10-CM | POA: Diagnosis not present

## 2020-10-09 DIAGNOSIS — Z7982 Long term (current) use of aspirin: Secondary | ICD-10-CM | POA: Diagnosis not present

## 2020-10-09 DIAGNOSIS — N2581 Secondary hyperparathyroidism of renal origin: Secondary | ICD-10-CM | POA: Diagnosis not present

## 2020-10-09 DIAGNOSIS — I509 Heart failure, unspecified: Secondary | ICD-10-CM | POA: Diagnosis not present

## 2020-10-09 DIAGNOSIS — E11319 Type 2 diabetes mellitus with unspecified diabetic retinopathy without macular edema: Secondary | ICD-10-CM | POA: Diagnosis not present

## 2020-10-09 DIAGNOSIS — E7800 Pure hypercholesterolemia, unspecified: Secondary | ICD-10-CM | POA: Diagnosis not present

## 2020-10-09 DIAGNOSIS — Z4781 Encounter for orthopedic aftercare following surgical amputation: Secondary | ICD-10-CM | POA: Diagnosis not present

## 2020-10-09 DIAGNOSIS — I132 Hypertensive heart and chronic kidney disease with heart failure and with stage 5 chronic kidney disease, or end stage renal disease: Secondary | ICD-10-CM | POA: Diagnosis not present

## 2020-10-09 DIAGNOSIS — N185 Chronic kidney disease, stage 5: Secondary | ICD-10-CM | POA: Diagnosis not present

## 2020-10-09 DIAGNOSIS — E1151 Type 2 diabetes mellitus with diabetic peripheral angiopathy without gangrene: Secondary | ICD-10-CM | POA: Diagnosis not present

## 2020-10-09 DIAGNOSIS — Z794 Long term (current) use of insulin: Secondary | ICD-10-CM | POA: Diagnosis not present

## 2020-10-09 DIAGNOSIS — G4733 Obstructive sleep apnea (adult) (pediatric): Secondary | ICD-10-CM | POA: Diagnosis not present

## 2020-10-09 DIAGNOSIS — Z4801 Encounter for change or removal of surgical wound dressing: Secondary | ICD-10-CM | POA: Diagnosis not present

## 2020-10-21 DIAGNOSIS — E1121 Type 2 diabetes mellitus with diabetic nephropathy: Secondary | ICD-10-CM | POA: Diagnosis not present

## 2020-10-21 DIAGNOSIS — I10 Essential (primary) hypertension: Secondary | ICD-10-CM | POA: Diagnosis not present

## 2020-10-21 DIAGNOSIS — N185 Chronic kidney disease, stage 5: Secondary | ICD-10-CM | POA: Diagnosis not present

## 2020-10-21 DIAGNOSIS — E785 Hyperlipidemia, unspecified: Secondary | ICD-10-CM | POA: Diagnosis not present

## 2020-10-21 DIAGNOSIS — D631 Anemia in chronic kidney disease: Secondary | ICD-10-CM | POA: Diagnosis not present

## 2020-10-26 ENCOUNTER — Encounter: Payer: Self-pay | Admitting: Internal Medicine

## 2020-10-26 ENCOUNTER — Other Ambulatory Visit: Payer: Self-pay

## 2020-10-26 ENCOUNTER — Telehealth (INDEPENDENT_AMBULATORY_CARE_PROVIDER_SITE_OTHER): Payer: Federal, State, Local not specified - PPO | Admitting: Internal Medicine

## 2020-10-26 VITALS — BP 170/88 | HR 71 | Temp 97.8°F | Resp 18 | Ht 69.0 in | Wt 280.0 lb

## 2020-10-26 DIAGNOSIS — J9801 Acute bronchospasm: Secondary | ICD-10-CM | POA: Diagnosis not present

## 2020-10-26 DIAGNOSIS — J3089 Other allergic rhinitis: Secondary | ICD-10-CM | POA: Diagnosis not present

## 2020-10-26 DIAGNOSIS — Z89512 Acquired absence of left leg below knee: Secondary | ICD-10-CM | POA: Insufficient documentation

## 2020-10-26 DIAGNOSIS — I10 Essential (primary) hypertension: Secondary | ICD-10-CM | POA: Diagnosis not present

## 2020-10-26 MED ORDER — ALBUTEROL SULFATE HFA 108 (90 BASE) MCG/ACT IN AERS: 2.0000 | INHALATION_SPRAY | Freq: Four times a day (QID) | RESPIRATORY_TRACT | 0 refills | Status: DC | PRN

## 2020-10-26 MED ORDER — FLUTICASONE PROPIONATE 50 MCG/ACT NA SUSP: 2.0000 | Freq: Every day | NASAL | 6 refills | Status: DC

## 2020-10-26 NOTE — Progress Notes (Signed)
Date:  10/26/2020   Name:  Cindy Osborne   DOB:  [birth year]   MRN:  [id number]  This encounter was conducted via video encounter due to the need for social distancing in light of the Covid-19 pandemic.  The patient was correctly identified.  I advised that I am conducting the visit from a secure room in my office at [facility] clinic.  The patient is located at her home. The limitations of this form of encounter were discussed with the patient and he/she agreed to proceed.  Some vital signs will be absent. Due to technical issues, the visit was converted to a telephone encounter.  Chief Complaint: Allergies (Nose Running, Sneezing, Wheezing. Has allergies every year this time. Started a few days ago. Taking Flonase, and Singulair daily. )  Wheezing  This is a new problem. The current episode started yesterday. The problem occurs intermittently. The problem has been gradually worsening. Associated symptoms include rhinorrhea. Pertinent negatives include no chills, coughing, diarrhea, ear pain, fever, headaches, neck pain, shortness of breath, sputum production or swollen glands. The symptoms are aggravated by pollens and lying flat. She has tried beta agonist inhalers (used albuterol in the past) for the symptoms.    Lab Results  Component Value Date   CREATININE 7.63 (H) 06/30/2020   BUN 80 (HH) 06/30/2020   NA 137 06/30/2020   K 5.2 06/30/2020   CL 102 06/30/2020   CO2 18 (L) 06/30/2020   Lab Results  Component Value Date   CHOL 177 06/30/2020   HDL 31 (L) 06/30/2020   LDLCALC 112 (H) 06/30/2020   TRIG 191 (H) 06/30/2020   CHOLHDL 5.7 (H) 06/30/2020   Lab Results  Component Value Date   TSH 2.070 02/26/2020   Lab Results  Component Value Date   HGBA1C 7.0 06/11/2020   Lab Results  Component Value Date   WBC 9.2 02/26/2020   HGB 10.3 (L) 02/26/2020   HCT 30.7 (L) 02/26/2020   MCV 91 02/26/2020   PLT 222 02/26/2020   Lab Results  Component Value Date   ALT 10  06/30/2020   AST 8 06/30/2020   ALKPHOS 118 06/30/2020   BILITOT 0.2 06/30/2020     Review of Systems  Constitutional: Negative for chills, fatigue and fever.  HENT: Positive for postnasal drip, rhinorrhea and sneezing. Negative for ear pain and sinus pressure.   Respiratory: Positive for wheezing. Negative for cough, sputum production, chest tightness and shortness of breath.   Gastrointestinal: Negative for diarrhea.  Musculoskeletal: Positive for gait problem (recent BKA). Negative for neck pain.  Neurological: Negative for dizziness, light-headedness and headaches.  Psychiatric/Behavioral: Positive for sleep disturbance. Negative for dysphoric mood. The patient is not nervous/anxious.     Patient Active Problem List   Diagnosis Date Noted  . Acquired absence of left leg below knee ([place]) 10/26/2020  . PVD (peripheral vascular disease) ([place]) 07/28/2020  . Uncontrolled type 2 diabetes mellitus with diabetic peripheral angiopathy without gangrene ([place]) 02/26/2020  . Acquired absence of right leg below knee ([place]) 07/28/2019  . Diabetic retinopathy ([place]) 11/23/2016  . Slow transit constipation 09/24/2015  . Environmental and seasonal allergies 07/09/2015  . Anemia associated with chronic renal failure 07/09/2015  . CKD stage 5 due to type 2 diabetes mellitus ([place]) 07/09/2015  . Type II diabetes mellitus with complication ([place]) [id number]  . Herpes simplex infection 07/09/2015  . Hyperlipidemia associated with type 2 diabetes mellitus ([place]) 07/09/2015  . Heart & renal disease, hypertensive, with  heart failure ([place]) 07/09/2015  . Arthritis of knee, degenerative 07/09/2015  . Tendinitis 07/09/2015  . Avitaminosis D 07/09/2015  . Body mass index (BMI) of 40.0-44.9 in adult ([place]) 06/07/2015  . Obstructive apnea 12/15/2014  . Intracervical pessary 01/07/2014  . Essential hypertension 09/11/2013  . Type 2 diabetes mellitus with diabetic nephropathy ([place]) 09/11/2013    Allergies   Allergen Reactions  . Atorvastatin     Other reaction(s): Other (See Comments) Back pain  . Peanut-Containing Drug Products Itching  . Pioglitazone     Other reaction(s): Edema  . Rosuvastatin Itching  . Shellfish-Derived Products Rash    shrimp  . Sulfa Antibiotics Rash    Past Surgical History:  Procedure Laterality Date  . angioplasty politeal Left 07/2020  . avg for dialysis    . CHOLECYSTECTOMY    . COLONOSCOPY  08/15/2011   cleared for 10 yrs  . LEG AMPUTATION BELOW KNEE Right 04/2019  . LEG AMPUTATION BELOW KNEE Left 09/08/2020  . TOE AMPUTATION Right 10/15/2018    Social History   Tobacco Use  . Smoking status: Never Smoker  . Smokeless tobacco: Never Used  Substance Use Topics  . Alcohol use: Yes    Alcohol/week: 0.0 standard drinks  . Drug use: No     Medication list has been reviewed and updated.  Current Meds  Medication Sig  . Acetaminophen (TYLENOL PO) Take by mouth.  Marland Kitchen albuterol (VENTOLIN HFA) 108 (90 Base) MCG/ACT inhaler Inhale 2 puffs into the lungs every 6 (six) hours as needed for wheezing or shortness of breath.  Marland Kitchen amLODipine (NORVASC) 10 MG tablet Take 1 tablet by mouth daily.  Marland Kitchen aspirin 81 MG EC tablet Take by mouth.  . Cholecalciferol 25 MCG (1000 UT) tablet Take by mouth daily. Takes 3 tablets  . clopidogrel (PLAVIX) 75 MG tablet Take 75 mg by mouth daily.  . ferric citrate (AURYXIA) 1 GM 210 MG(Fe) tablet 1 tablet at breakfast, 2 at lunch, and 1 dinner  . fluticasone (FLONASE) 50 MCG/ACT nasal spray Place 2 sprays into both nostrils daily.  . hydrochlorothiazide (HYDRODIURIL) 50 MG tablet Take 2 tablets by mouth daily.   . insulin aspart (NOVOLOG) 100 UNIT/ML FlexPen Inject 5 Units into the skin 3 (three) times daily before meals.  . insulin glargine (LANTUS) 100 UNIT/ML Solostar Pen Inject 24 Units into the skin daily.  Marland Kitchen levonorgestrel (MIRENA) 20 MCG/24HR IUD by Intrauterine route.  . metoprolol (TOPROL-XL) 200 MG 24 hr tablet Take  200 mg by mouth daily.  . montelukast (SINGULAIR) 10 MG tablet Take 1 tablet (10 mg total) by mouth at bedtime. (Patient taking differently: Take 10 mg by mouth as needed.)  . mupirocin ointment (BACTROBAN) 2 % mupirocin 2 % topical ointment  . pravastatin (PRAVACHOL) 20 MG tablet Take 1 tablet (20 mg total) by mouth daily.  . sennosides-docusate sodium (SENOKOT-S) 8.6-50 MG tablet Take 1 tablet by mouth as needed for constipation.  . sodium bicarbonate 650 MG tablet Take by mouth 3 (three) times daily.  Marland Kitchen triamcinolone cream (KENALOG) 0.1 % Apply 1 application topically 2 (two) times daily. To rash on leg  . [DISCONTINUED] benazepril (LOTENSIN) 40 MG tablet   . [DISCONTINUED] losartan (COZAAR) 25 MG tablet Take 10 mg by mouth daily.    PHQ 2/9 Scores 10/26/2020 07/28/2020 06/30/2020 02/26/2020  PHQ - 2 Score 0 0 0 0  PHQ- 9 Score 0 0 0 0    GAD 7 : Generalized Anxiety Score 10/26/2020 07/28/2020 06/30/2020 02/26/2020  Nervous, Anxious, on Edge 1 0 0 0  Control/stop worrying 2 0 0 0  Worry too much - different things 2 0 0 0  Trouble relaxing 0 0 0 0  Restless 0 0 0 0  Easily annoyed or irritable 0 0 0 0  Afraid - awful might happen 0 0 0 0  Total GAD 7 Score 5 0 0 0  Anxiety Difficulty Not difficult at all - - Not difficult at all    BP Readings from Last 3 Encounters:  10/26/20 (!) 170/88  07/28/20 128/80  06/30/20 128/80    Physical Exam Pulmonary:     Effort: Pulmonary effort is normal.     Comments: No cough or dyspnea noted during the call No wheezing appreciated Neurological:     Mental Status: She is alert.  Psychiatric:        Attention and Perception: Attention normal.        Mood and Affect: Mood normal.        Speech: Speech normal.        Cognition and Memory: Cognition normal.     Wt Readings from Last 3 Encounters:  10/26/20 280 lb (127 kg)  07/28/20 280 lb (127 kg)  06/30/20 286 lb (129.7 kg)    BP (!) 170/88   Pulse 71   Temp 97.8 F (36.6 C)  (Oral)   Resp 18   Ht '5\' 9"'$  (1.753 m)   Wt 280 lb (127 kg)   SpO2 99%   BMI 41.35 kg/m   Assessment and Plan: 1. Environmental and seasonal allergies Continue Singulair and Allegra Will refill flonase spray - fluticasone (FLONASE) 50 MCG/ACT nasal spray; Place 2 sprays into both nostrils daily.  Dispense: 16 g; Refill: 6  2. Bronchospasm Due to PND and allergies - albuterol (VENTOLIN HFA) 108 (90 Base) MCG/ACT inhaler; Inhale 2 puffs into the lungs every 6 (six) hours as needed for wheezing or shortness of breath.  Dispense: 1 each; Refill: 0  3. Essential hypertension Recent changes in regimen due to hyperkalemia Now on amlodipine, HCTZ, Lasix and metoprolol She will follow up with Nephrology for management  I spent 10 minutes on this encounter. Partially dictated using Editor, commissioning. Any errors are unintentional.  Halina Maidens, MD [facility]  10/26/2020

## 2020-11-01 DIAGNOSIS — Z89512 Acquired absence of left leg below knee: Secondary | ICD-10-CM | POA: Diagnosis not present

## 2020-11-01 DIAGNOSIS — Z794 Long term (current) use of insulin: Secondary | ICD-10-CM | POA: Diagnosis not present

## 2020-11-01 DIAGNOSIS — Z6841 Body Mass Index (BMI) 40.0 and over, adult: Secondary | ICD-10-CM | POA: Diagnosis not present

## 2020-11-01 DIAGNOSIS — Z89511 Acquired absence of right leg below knee: Secondary | ICD-10-CM | POA: Diagnosis not present

## 2020-11-01 DIAGNOSIS — E1121 Type 2 diabetes mellitus with diabetic nephropathy: Secondary | ICD-10-CM | POA: Diagnosis not present

## 2020-11-14 ENCOUNTER — Other Ambulatory Visit: Payer: Self-pay | Admitting: Internal Medicine

## 2020-11-14 DIAGNOSIS — J9801 Acute bronchospasm: Secondary | ICD-10-CM

## 2020-11-14 NOTE — Telephone Encounter (Signed)
Requested Prescriptions  Pending Prescriptions Disp Refills  . albuterol (VENTOLIN HFA) 108 (90 Base) MCG/ACT inhaler [Pharmacy Med Name: ALBUTEROL HFA (VENTOLIN) INH] 18 each 0    Sig: TAKE 2 PUFFS BY MOUTH EVERY 6 HOURS AS NEEDED FOR WHEEZE OR SHORTNESS OF BREATH     Pulmonology:  Beta Agonists Failed - 11/14/2020  1:17 AM      Failed - One inhaler should last at least one month. If the patient is requesting refills earlier, contact the patient to check for uncontrolled symptoms.      Passed - Valid encounter within last 12 months    Recent Outpatient Visits          2 weeks ago Environmental and seasonal allergies   [facility] Glean Hess, MD   3 months ago Cellulitis of toe of left foot   [facility] Glean Hess, MD   4 months ago Hyperlipidemia associated with type 2 diabetes mellitus [facility])   [facility] Glean Hess, MD   8 months ago Annual physical exam   [facility] Glean Hess, MD   12 months ago Essential hypertension   [facility] Glean Hess, MD      Future Appointments            In 3 months [name] [name], MD [facility], [facility]

## 2020-11-25 ENCOUNTER — Telehealth: Payer: Self-pay | Admitting: Internal Medicine

## 2020-11-25 NOTE — Telephone Encounter (Signed)
Patient called to request a referral for PT for her amputated foot.  She stated that she is ready for PT since she will be getting a prosthetic foot.  Please advise and call patient once the referral has been submitted.  Patient would like it to be with [facility].  CB# [phone]

## 2020-11-29 ENCOUNTER — Other Ambulatory Visit: Payer: Self-pay

## 2020-11-29 DIAGNOSIS — Z7689 Persons encountering health services in other specified circumstances: Secondary | ICD-10-CM

## 2020-11-29 DIAGNOSIS — Z0189 Encounter for other specified special examinations: Secondary | ICD-10-CM

## 2020-11-29 NOTE — Telephone Encounter (Signed)
Pt will get 50 visits with insurance. News a new order. Pt gets her foot in 2 weeks. Help pt learn how to walk with her new foot. PT will come out and see pt at her home. [facility] Fax [phone] Phone number-[phone]. A RF will be placed.

## 2020-11-29 NOTE — Telephone Encounter (Signed)
Please review.  KP

## 2020-12-04 DIAGNOSIS — F1020 Alcohol dependence, uncomplicated: Secondary | ICD-10-CM | POA: Diagnosis not present

## 2020-12-04 DIAGNOSIS — G4733 Obstructive sleep apnea (adult) (pediatric): Secondary | ICD-10-CM | POA: Diagnosis not present

## 2020-12-04 DIAGNOSIS — Z89512 Acquired absence of left leg below knee: Secondary | ICD-10-CM | POA: Diagnosis not present

## 2020-12-04 DIAGNOSIS — I132 Hypertensive heart and chronic kidney disease with heart failure and with stage 5 chronic kidney disease, or end stage renal disease: Secondary | ICD-10-CM | POA: Diagnosis not present

## 2020-12-04 DIAGNOSIS — E1151 Type 2 diabetes mellitus with diabetic peripheral angiopathy without gangrene: Secondary | ICD-10-CM | POA: Diagnosis not present

## 2020-12-04 DIAGNOSIS — Z7982 Long term (current) use of aspirin: Secondary | ICD-10-CM | POA: Diagnosis not present

## 2020-12-04 DIAGNOSIS — Z794 Long term (current) use of insulin: Secondary | ICD-10-CM | POA: Diagnosis not present

## 2020-12-04 DIAGNOSIS — Z7951 Long term (current) use of inhaled steroids: Secondary | ICD-10-CM | POA: Diagnosis not present

## 2020-12-04 DIAGNOSIS — N185 Chronic kidney disease, stage 5: Secondary | ICD-10-CM | POA: Diagnosis not present

## 2020-12-04 DIAGNOSIS — E11319 Type 2 diabetes mellitus with unspecified diabetic retinopathy without macular edema: Secondary | ICD-10-CM | POA: Diagnosis not present

## 2020-12-04 DIAGNOSIS — D649 Anemia, unspecified: Secondary | ICD-10-CM | POA: Diagnosis not present

## 2020-12-04 DIAGNOSIS — Z7902 Long term (current) use of antithrombotics/antiplatelets: Secondary | ICD-10-CM | POA: Diagnosis not present

## 2020-12-04 DIAGNOSIS — E1169 Type 2 diabetes mellitus with other specified complication: Secondary | ICD-10-CM | POA: Diagnosis not present

## 2020-12-04 DIAGNOSIS — I509 Heart failure, unspecified: Secondary | ICD-10-CM | POA: Diagnosis not present

## 2020-12-04 DIAGNOSIS — E785 Hyperlipidemia, unspecified: Secondary | ICD-10-CM | POA: Diagnosis not present

## 2020-12-04 DIAGNOSIS — Z89511 Acquired absence of right leg below knee: Secondary | ICD-10-CM | POA: Diagnosis not present

## 2020-12-13 ENCOUNTER — Other Ambulatory Visit (INDEPENDENT_AMBULATORY_CARE_PROVIDER_SITE_OTHER): Payer: Federal, State, Local not specified - PPO | Admitting: Internal Medicine

## 2020-12-13 DIAGNOSIS — Z89432 Acquired absence of left foot: Secondary | ICD-10-CM

## 2020-12-13 DIAGNOSIS — E1165 Type 2 diabetes mellitus with hyperglycemia: Secondary | ICD-10-CM

## 2020-12-13 DIAGNOSIS — Z7902 Long term (current) use of antithrombotics/antiplatelets: Secondary | ICD-10-CM

## 2020-12-13 DIAGNOSIS — Z89512 Acquired absence of left leg below knee: Secondary | ICD-10-CM

## 2020-12-13 DIAGNOSIS — E1122 Type 2 diabetes mellitus with diabetic chronic kidney disease: Secondary | ICD-10-CM

## 2020-12-13 DIAGNOSIS — F1020 Alcohol dependence, uncomplicated: Secondary | ICD-10-CM

## 2020-12-13 DIAGNOSIS — IMO0002 Reserved for concepts with insufficient information to code with codable children: Secondary | ICD-10-CM

## 2020-12-13 DIAGNOSIS — E1151 Type 2 diabetes mellitus with diabetic peripheral angiopathy without gangrene: Secondary | ICD-10-CM

## 2020-12-13 DIAGNOSIS — I10 Essential (primary) hypertension: Secondary | ICD-10-CM

## 2020-12-13 DIAGNOSIS — G4733 Obstructive sleep apnea (adult) (pediatric): Secondary | ICD-10-CM

## 2020-12-13 DIAGNOSIS — N185 Chronic kidney disease, stage 5: Secondary | ICD-10-CM

## 2020-12-13 DIAGNOSIS — I130 Hypertensive heart and chronic kidney disease with heart failure and stage 1 through stage 4 chronic kidney disease, or unspecified chronic kidney disease: Secondary | ICD-10-CM

## 2020-12-13 DIAGNOSIS — Z89511 Acquired absence of right leg below knee: Secondary | ICD-10-CM

## 2020-12-13 DIAGNOSIS — Z7951 Long term (current) use of inhaled steroids: Secondary | ICD-10-CM

## 2020-12-13 DIAGNOSIS — Z7982 Long term (current) use of aspirin: Secondary | ICD-10-CM

## 2020-12-13 DIAGNOSIS — E118 Type 2 diabetes mellitus with unspecified complications: Secondary | ICD-10-CM

## 2020-12-13 NOTE — Progress Notes (Signed)
Received orders from [facility]. Start of care 12/04/20.   Orders from 12/04/20 through 02/01/21 are reviewed, signed and faxed.   

## 2020-12-22 DIAGNOSIS — N185 Chronic kidney disease, stage 5: Secondary | ICD-10-CM | POA: Diagnosis not present

## 2020-12-22 DIAGNOSIS — E1121 Type 2 diabetes mellitus with diabetic nephropathy: Secondary | ICD-10-CM | POA: Diagnosis not present

## 2020-12-22 DIAGNOSIS — E785 Hyperlipidemia, unspecified: Secondary | ICD-10-CM | POA: Diagnosis not present

## 2020-12-22 DIAGNOSIS — Z89512 Acquired absence of left leg below knee: Secondary | ICD-10-CM | POA: Diagnosis not present

## 2020-12-22 DIAGNOSIS — D631 Anemia in chronic kidney disease: Secondary | ICD-10-CM | POA: Diagnosis not present

## 2020-12-23 ENCOUNTER — Other Ambulatory Visit: Payer: Self-pay | Admitting: Internal Medicine

## 2020-12-23 DIAGNOSIS — E1169 Type 2 diabetes mellitus with other specified complication: Secondary | ICD-10-CM

## 2020-12-28 DIAGNOSIS — Z794 Long term (current) use of insulin: Secondary | ICD-10-CM | POA: Diagnosis not present

## 2020-12-28 DIAGNOSIS — I132 Hypertensive heart and chronic kidney disease with heart failure and with stage 5 chronic kidney disease, or end stage renal disease: Secondary | ICD-10-CM | POA: Diagnosis not present

## 2020-12-28 DIAGNOSIS — E11319 Type 2 diabetes mellitus with unspecified diabetic retinopathy without macular edema: Secondary | ICD-10-CM | POA: Diagnosis not present

## 2020-12-28 DIAGNOSIS — E1151 Type 2 diabetes mellitus with diabetic peripheral angiopathy without gangrene: Secondary | ICD-10-CM | POA: Diagnosis not present

## 2020-12-28 DIAGNOSIS — F1020 Alcohol dependence, uncomplicated: Secondary | ICD-10-CM | POA: Diagnosis not present

## 2020-12-28 DIAGNOSIS — Z7982 Long term (current) use of aspirin: Secondary | ICD-10-CM | POA: Diagnosis not present

## 2020-12-28 DIAGNOSIS — Z7902 Long term (current) use of antithrombotics/antiplatelets: Secondary | ICD-10-CM | POA: Diagnosis not present

## 2020-12-28 DIAGNOSIS — Z89511 Acquired absence of right leg below knee: Secondary | ICD-10-CM | POA: Diagnosis not present

## 2020-12-28 DIAGNOSIS — Z7951 Long term (current) use of inhaled steroids: Secondary | ICD-10-CM | POA: Diagnosis not present

## 2020-12-28 DIAGNOSIS — E1169 Type 2 diabetes mellitus with other specified complication: Secondary | ICD-10-CM | POA: Diagnosis not present

## 2020-12-28 DIAGNOSIS — N185 Chronic kidney disease, stage 5: Secondary | ICD-10-CM | POA: Diagnosis not present

## 2020-12-28 DIAGNOSIS — G4733 Obstructive sleep apnea (adult) (pediatric): Secondary | ICD-10-CM | POA: Diagnosis not present

## 2020-12-28 DIAGNOSIS — Z89512 Acquired absence of left leg below knee: Secondary | ICD-10-CM | POA: Diagnosis not present

## 2020-12-28 DIAGNOSIS — E785 Hyperlipidemia, unspecified: Secondary | ICD-10-CM | POA: Diagnosis not present

## 2020-12-28 DIAGNOSIS — D649 Anemia, unspecified: Secondary | ICD-10-CM | POA: Diagnosis not present

## 2020-12-28 DIAGNOSIS — I509 Heart failure, unspecified: Secondary | ICD-10-CM | POA: Diagnosis not present

## 2020-12-29 DIAGNOSIS — F1020 Alcohol dependence, uncomplicated: Secondary | ICD-10-CM | POA: Diagnosis not present

## 2020-12-29 DIAGNOSIS — I509 Heart failure, unspecified: Secondary | ICD-10-CM | POA: Diagnosis not present

## 2020-12-29 DIAGNOSIS — N185 Chronic kidney disease, stage 5: Secondary | ICD-10-CM | POA: Diagnosis not present

## 2020-12-29 DIAGNOSIS — E1169 Type 2 diabetes mellitus with other specified complication: Secondary | ICD-10-CM | POA: Diagnosis not present

## 2020-12-29 DIAGNOSIS — E1151 Type 2 diabetes mellitus with diabetic peripheral angiopathy without gangrene: Secondary | ICD-10-CM | POA: Diagnosis not present

## 2020-12-29 DIAGNOSIS — Z89512 Acquired absence of left leg below knee: Secondary | ICD-10-CM | POA: Diagnosis not present

## 2020-12-29 DIAGNOSIS — I132 Hypertensive heart and chronic kidney disease with heart failure and with stage 5 chronic kidney disease, or end stage renal disease: Secondary | ICD-10-CM | POA: Diagnosis not present

## 2020-12-29 DIAGNOSIS — Z794 Long term (current) use of insulin: Secondary | ICD-10-CM | POA: Diagnosis not present

## 2020-12-29 DIAGNOSIS — Z7982 Long term (current) use of aspirin: Secondary | ICD-10-CM | POA: Diagnosis not present

## 2020-12-29 DIAGNOSIS — E11319 Type 2 diabetes mellitus with unspecified diabetic retinopathy without macular edema: Secondary | ICD-10-CM | POA: Diagnosis not present

## 2020-12-29 DIAGNOSIS — Z7951 Long term (current) use of inhaled steroids: Secondary | ICD-10-CM | POA: Diagnosis not present

## 2020-12-29 DIAGNOSIS — Z89511 Acquired absence of right leg below knee: Secondary | ICD-10-CM | POA: Diagnosis not present

## 2020-12-29 DIAGNOSIS — D649 Anemia, unspecified: Secondary | ICD-10-CM | POA: Diagnosis not present

## 2020-12-29 DIAGNOSIS — Z7902 Long term (current) use of antithrombotics/antiplatelets: Secondary | ICD-10-CM | POA: Diagnosis not present

## 2020-12-29 DIAGNOSIS — G4733 Obstructive sleep apnea (adult) (pediatric): Secondary | ICD-10-CM | POA: Diagnosis not present

## 2020-12-29 DIAGNOSIS — E785 Hyperlipidemia, unspecified: Secondary | ICD-10-CM | POA: Diagnosis not present

## 2020-12-30 LAB — HEMOGLOBIN A1C: Hemoglobin A1C: 6.3

## 2021-01-04 DIAGNOSIS — Z89511 Acquired absence of right leg below knee: Secondary | ICD-10-CM | POA: Diagnosis not present

## 2021-01-04 DIAGNOSIS — Z89512 Acquired absence of left leg below knee: Secondary | ICD-10-CM | POA: Diagnosis not present

## 2021-01-04 DIAGNOSIS — E1169 Type 2 diabetes mellitus with other specified complication: Secondary | ICD-10-CM | POA: Diagnosis not present

## 2021-01-04 DIAGNOSIS — Z7951 Long term (current) use of inhaled steroids: Secondary | ICD-10-CM | POA: Diagnosis not present

## 2021-01-04 DIAGNOSIS — I509 Heart failure, unspecified: Secondary | ICD-10-CM | POA: Diagnosis not present

## 2021-01-04 DIAGNOSIS — E11319 Type 2 diabetes mellitus with unspecified diabetic retinopathy without macular edema: Secondary | ICD-10-CM | POA: Diagnosis not present

## 2021-01-04 DIAGNOSIS — D649 Anemia, unspecified: Secondary | ICD-10-CM | POA: Diagnosis not present

## 2021-01-04 DIAGNOSIS — N185 Chronic kidney disease, stage 5: Secondary | ICD-10-CM | POA: Diagnosis not present

## 2021-01-04 DIAGNOSIS — E785 Hyperlipidemia, unspecified: Secondary | ICD-10-CM | POA: Diagnosis not present

## 2021-01-04 DIAGNOSIS — I132 Hypertensive heart and chronic kidney disease with heart failure and with stage 5 chronic kidney disease, or end stage renal disease: Secondary | ICD-10-CM | POA: Diagnosis not present

## 2021-01-04 DIAGNOSIS — G4733 Obstructive sleep apnea (adult) (pediatric): Secondary | ICD-10-CM | POA: Diagnosis not present

## 2021-01-04 DIAGNOSIS — F1020 Alcohol dependence, uncomplicated: Secondary | ICD-10-CM | POA: Diagnosis not present

## 2021-01-04 DIAGNOSIS — E1151 Type 2 diabetes mellitus with diabetic peripheral angiopathy without gangrene: Secondary | ICD-10-CM | POA: Diagnosis not present

## 2021-01-04 DIAGNOSIS — Z794 Long term (current) use of insulin: Secondary | ICD-10-CM | POA: Diagnosis not present

## 2021-01-04 DIAGNOSIS — Z7982 Long term (current) use of aspirin: Secondary | ICD-10-CM | POA: Diagnosis not present

## 2021-01-04 DIAGNOSIS — Z7902 Long term (current) use of antithrombotics/antiplatelets: Secondary | ICD-10-CM | POA: Diagnosis not present

## 2021-01-06 DIAGNOSIS — I509 Heart failure, unspecified: Secondary | ICD-10-CM | POA: Diagnosis not present

## 2021-01-06 DIAGNOSIS — Z7951 Long term (current) use of inhaled steroids: Secondary | ICD-10-CM | POA: Diagnosis not present

## 2021-01-06 DIAGNOSIS — E1151 Type 2 diabetes mellitus with diabetic peripheral angiopathy without gangrene: Secondary | ICD-10-CM | POA: Diagnosis not present

## 2021-01-06 DIAGNOSIS — Z7982 Long term (current) use of aspirin: Secondary | ICD-10-CM | POA: Diagnosis not present

## 2021-01-06 DIAGNOSIS — F1020 Alcohol dependence, uncomplicated: Secondary | ICD-10-CM | POA: Diagnosis not present

## 2021-01-06 DIAGNOSIS — Z7902 Long term (current) use of antithrombotics/antiplatelets: Secondary | ICD-10-CM | POA: Diagnosis not present

## 2021-01-06 DIAGNOSIS — E1169 Type 2 diabetes mellitus with other specified complication: Secondary | ICD-10-CM | POA: Diagnosis not present

## 2021-01-06 DIAGNOSIS — Z89512 Acquired absence of left leg below knee: Secondary | ICD-10-CM | POA: Diagnosis not present

## 2021-01-06 DIAGNOSIS — Z794 Long term (current) use of insulin: Secondary | ICD-10-CM | POA: Diagnosis not present

## 2021-01-06 DIAGNOSIS — Z89511 Acquired absence of right leg below knee: Secondary | ICD-10-CM | POA: Diagnosis not present

## 2021-01-06 DIAGNOSIS — N185 Chronic kidney disease, stage 5: Secondary | ICD-10-CM | POA: Diagnosis not present

## 2021-01-06 DIAGNOSIS — E11319 Type 2 diabetes mellitus with unspecified diabetic retinopathy without macular edema: Secondary | ICD-10-CM | POA: Diagnosis not present

## 2021-01-06 DIAGNOSIS — G4733 Obstructive sleep apnea (adult) (pediatric): Secondary | ICD-10-CM | POA: Diagnosis not present

## 2021-01-06 DIAGNOSIS — E785 Hyperlipidemia, unspecified: Secondary | ICD-10-CM | POA: Diagnosis not present

## 2021-01-06 DIAGNOSIS — D649 Anemia, unspecified: Secondary | ICD-10-CM | POA: Diagnosis not present

## 2021-01-06 DIAGNOSIS — I132 Hypertensive heart and chronic kidney disease with heart failure and with stage 5 chronic kidney disease, or end stage renal disease: Secondary | ICD-10-CM | POA: Diagnosis not present

## 2021-01-07 DIAGNOSIS — Z4781 Encounter for orthopedic aftercare following surgical amputation: Secondary | ICD-10-CM | POA: Diagnosis not present

## 2021-01-07 DIAGNOSIS — Z89512 Acquired absence of left leg below knee: Secondary | ICD-10-CM | POA: Diagnosis not present

## 2021-01-07 DIAGNOSIS — Z89511 Acquired absence of right leg below knee: Secondary | ICD-10-CM | POA: Diagnosis not present

## 2021-01-11 DIAGNOSIS — F1020 Alcohol dependence, uncomplicated: Secondary | ICD-10-CM | POA: Diagnosis not present

## 2021-01-11 DIAGNOSIS — Z89511 Acquired absence of right leg below knee: Secondary | ICD-10-CM | POA: Diagnosis not present

## 2021-01-11 DIAGNOSIS — Z794 Long term (current) use of insulin: Secondary | ICD-10-CM | POA: Diagnosis not present

## 2021-01-11 DIAGNOSIS — E785 Hyperlipidemia, unspecified: Secondary | ICD-10-CM | POA: Diagnosis not present

## 2021-01-11 DIAGNOSIS — E1151 Type 2 diabetes mellitus with diabetic peripheral angiopathy without gangrene: Secondary | ICD-10-CM | POA: Diagnosis not present

## 2021-01-11 DIAGNOSIS — I132 Hypertensive heart and chronic kidney disease with heart failure and with stage 5 chronic kidney disease, or end stage renal disease: Secondary | ICD-10-CM | POA: Diagnosis not present

## 2021-01-11 DIAGNOSIS — Z7951 Long term (current) use of inhaled steroids: Secondary | ICD-10-CM | POA: Diagnosis not present

## 2021-01-11 DIAGNOSIS — Z7982 Long term (current) use of aspirin: Secondary | ICD-10-CM | POA: Diagnosis not present

## 2021-01-11 DIAGNOSIS — Z7902 Long term (current) use of antithrombotics/antiplatelets: Secondary | ICD-10-CM | POA: Diagnosis not present

## 2021-01-11 DIAGNOSIS — G4733 Obstructive sleep apnea (adult) (pediatric): Secondary | ICD-10-CM | POA: Diagnosis not present

## 2021-01-11 DIAGNOSIS — E11319 Type 2 diabetes mellitus with unspecified diabetic retinopathy without macular edema: Secondary | ICD-10-CM | POA: Diagnosis not present

## 2021-01-11 DIAGNOSIS — D649 Anemia, unspecified: Secondary | ICD-10-CM | POA: Diagnosis not present

## 2021-01-11 DIAGNOSIS — I509 Heart failure, unspecified: Secondary | ICD-10-CM | POA: Diagnosis not present

## 2021-01-11 DIAGNOSIS — N185 Chronic kidney disease, stage 5: Secondary | ICD-10-CM | POA: Diagnosis not present

## 2021-01-11 DIAGNOSIS — E1169 Type 2 diabetes mellitus with other specified complication: Secondary | ICD-10-CM | POA: Diagnosis not present

## 2021-01-11 DIAGNOSIS — Z89512 Acquired absence of left leg below knee: Secondary | ICD-10-CM | POA: Diagnosis not present

## 2021-01-13 DIAGNOSIS — E1169 Type 2 diabetes mellitus with other specified complication: Secondary | ICD-10-CM | POA: Diagnosis not present

## 2021-01-13 DIAGNOSIS — Z7902 Long term (current) use of antithrombotics/antiplatelets: Secondary | ICD-10-CM | POA: Diagnosis not present

## 2021-01-13 DIAGNOSIS — N185 Chronic kidney disease, stage 5: Secondary | ICD-10-CM | POA: Diagnosis not present

## 2021-01-13 DIAGNOSIS — Z7982 Long term (current) use of aspirin: Secondary | ICD-10-CM | POA: Diagnosis not present

## 2021-01-13 DIAGNOSIS — I509 Heart failure, unspecified: Secondary | ICD-10-CM | POA: Diagnosis not present

## 2021-01-13 DIAGNOSIS — E1151 Type 2 diabetes mellitus with diabetic peripheral angiopathy without gangrene: Secondary | ICD-10-CM | POA: Diagnosis not present

## 2021-01-13 DIAGNOSIS — E785 Hyperlipidemia, unspecified: Secondary | ICD-10-CM | POA: Diagnosis not present

## 2021-01-13 DIAGNOSIS — Z89512 Acquired absence of left leg below knee: Secondary | ICD-10-CM | POA: Diagnosis not present

## 2021-01-13 DIAGNOSIS — F1020 Alcohol dependence, uncomplicated: Secondary | ICD-10-CM | POA: Diagnosis not present

## 2021-01-13 DIAGNOSIS — G4733 Obstructive sleep apnea (adult) (pediatric): Secondary | ICD-10-CM | POA: Diagnosis not present

## 2021-01-13 DIAGNOSIS — Z89511 Acquired absence of right leg below knee: Secondary | ICD-10-CM | POA: Diagnosis not present

## 2021-01-13 DIAGNOSIS — D649 Anemia, unspecified: Secondary | ICD-10-CM | POA: Diagnosis not present

## 2021-01-13 DIAGNOSIS — E11319 Type 2 diabetes mellitus with unspecified diabetic retinopathy without macular edema: Secondary | ICD-10-CM | POA: Diagnosis not present

## 2021-01-13 DIAGNOSIS — Z7951 Long term (current) use of inhaled steroids: Secondary | ICD-10-CM | POA: Diagnosis not present

## 2021-01-13 DIAGNOSIS — Z794 Long term (current) use of insulin: Secondary | ICD-10-CM | POA: Diagnosis not present

## 2021-01-13 DIAGNOSIS — I132 Hypertensive heart and chronic kidney disease with heart failure and with stage 5 chronic kidney disease, or end stage renal disease: Secondary | ICD-10-CM | POA: Diagnosis not present

## 2021-01-17 DIAGNOSIS — D649 Anemia, unspecified: Secondary | ICD-10-CM | POA: Diagnosis not present

## 2021-01-17 DIAGNOSIS — N185 Chronic kidney disease, stage 5: Secondary | ICD-10-CM | POA: Diagnosis not present

## 2021-01-17 DIAGNOSIS — I132 Hypertensive heart and chronic kidney disease with heart failure and with stage 5 chronic kidney disease, or end stage renal disease: Secondary | ICD-10-CM | POA: Diagnosis not present

## 2021-01-17 DIAGNOSIS — Z7902 Long term (current) use of antithrombotics/antiplatelets: Secondary | ICD-10-CM | POA: Diagnosis not present

## 2021-01-17 DIAGNOSIS — F1020 Alcohol dependence, uncomplicated: Secondary | ICD-10-CM | POA: Diagnosis not present

## 2021-01-17 DIAGNOSIS — E1151 Type 2 diabetes mellitus with diabetic peripheral angiopathy without gangrene: Secondary | ICD-10-CM | POA: Diagnosis not present

## 2021-01-17 DIAGNOSIS — E11319 Type 2 diabetes mellitus with unspecified diabetic retinopathy without macular edema: Secondary | ICD-10-CM | POA: Diagnosis not present

## 2021-01-17 DIAGNOSIS — Z89512 Acquired absence of left leg below knee: Secondary | ICD-10-CM | POA: Diagnosis not present

## 2021-01-17 DIAGNOSIS — G4733 Obstructive sleep apnea (adult) (pediatric): Secondary | ICD-10-CM | POA: Diagnosis not present

## 2021-01-17 DIAGNOSIS — Z7982 Long term (current) use of aspirin: Secondary | ICD-10-CM | POA: Diagnosis not present

## 2021-01-17 DIAGNOSIS — Z794 Long term (current) use of insulin: Secondary | ICD-10-CM | POA: Diagnosis not present

## 2021-01-17 DIAGNOSIS — I509 Heart failure, unspecified: Secondary | ICD-10-CM | POA: Diagnosis not present

## 2021-01-17 DIAGNOSIS — Z7951 Long term (current) use of inhaled steroids: Secondary | ICD-10-CM | POA: Diagnosis not present

## 2021-01-17 DIAGNOSIS — E785 Hyperlipidemia, unspecified: Secondary | ICD-10-CM | POA: Diagnosis not present

## 2021-01-17 DIAGNOSIS — Z89511 Acquired absence of right leg below knee: Secondary | ICD-10-CM | POA: Diagnosis not present

## 2021-01-17 DIAGNOSIS — E1169 Type 2 diabetes mellitus with other specified complication: Secondary | ICD-10-CM | POA: Diagnosis not present

## 2021-01-20 DIAGNOSIS — Z7982 Long term (current) use of aspirin: Secondary | ICD-10-CM | POA: Diagnosis not present

## 2021-01-20 DIAGNOSIS — Z89512 Acquired absence of left leg below knee: Secondary | ICD-10-CM | POA: Diagnosis not present

## 2021-01-20 DIAGNOSIS — I132 Hypertensive heart and chronic kidney disease with heart failure and with stage 5 chronic kidney disease, or end stage renal disease: Secondary | ICD-10-CM | POA: Diagnosis not present

## 2021-01-20 DIAGNOSIS — N185 Chronic kidney disease, stage 5: Secondary | ICD-10-CM | POA: Diagnosis not present

## 2021-01-20 DIAGNOSIS — Z7951 Long term (current) use of inhaled steroids: Secondary | ICD-10-CM | POA: Diagnosis not present

## 2021-01-20 DIAGNOSIS — I509 Heart failure, unspecified: Secondary | ICD-10-CM | POA: Diagnosis not present

## 2021-01-20 DIAGNOSIS — E1151 Type 2 diabetes mellitus with diabetic peripheral angiopathy without gangrene: Secondary | ICD-10-CM | POA: Diagnosis not present

## 2021-01-20 DIAGNOSIS — D649 Anemia, unspecified: Secondary | ICD-10-CM | POA: Diagnosis not present

## 2021-01-20 DIAGNOSIS — F1020 Alcohol dependence, uncomplicated: Secondary | ICD-10-CM | POA: Diagnosis not present

## 2021-01-20 DIAGNOSIS — Z89511 Acquired absence of right leg below knee: Secondary | ICD-10-CM | POA: Diagnosis not present

## 2021-01-20 DIAGNOSIS — Z7902 Long term (current) use of antithrombotics/antiplatelets: Secondary | ICD-10-CM | POA: Diagnosis not present

## 2021-01-20 DIAGNOSIS — E11319 Type 2 diabetes mellitus with unspecified diabetic retinopathy without macular edema: Secondary | ICD-10-CM | POA: Diagnosis not present

## 2021-01-20 DIAGNOSIS — E785 Hyperlipidemia, unspecified: Secondary | ICD-10-CM | POA: Diagnosis not present

## 2021-01-20 DIAGNOSIS — Z794 Long term (current) use of insulin: Secondary | ICD-10-CM | POA: Diagnosis not present

## 2021-01-20 DIAGNOSIS — G4733 Obstructive sleep apnea (adult) (pediatric): Secondary | ICD-10-CM | POA: Diagnosis not present

## 2021-01-20 DIAGNOSIS — E1169 Type 2 diabetes mellitus with other specified complication: Secondary | ICD-10-CM | POA: Diagnosis not present

## 2021-01-26 DIAGNOSIS — E785 Hyperlipidemia, unspecified: Secondary | ICD-10-CM | POA: Diagnosis not present

## 2021-01-26 DIAGNOSIS — Z89512 Acquired absence of left leg below knee: Secondary | ICD-10-CM | POA: Diagnosis not present

## 2021-01-26 DIAGNOSIS — G4733 Obstructive sleep apnea (adult) (pediatric): Secondary | ICD-10-CM | POA: Diagnosis not present

## 2021-01-26 DIAGNOSIS — E11319 Type 2 diabetes mellitus with unspecified diabetic retinopathy without macular edema: Secondary | ICD-10-CM | POA: Diagnosis not present

## 2021-01-26 DIAGNOSIS — D649 Anemia, unspecified: Secondary | ICD-10-CM | POA: Diagnosis not present

## 2021-01-26 DIAGNOSIS — N185 Chronic kidney disease, stage 5: Secondary | ICD-10-CM | POA: Diagnosis not present

## 2021-01-26 DIAGNOSIS — F1020 Alcohol dependence, uncomplicated: Secondary | ICD-10-CM | POA: Diagnosis not present

## 2021-01-26 DIAGNOSIS — Z7902 Long term (current) use of antithrombotics/antiplatelets: Secondary | ICD-10-CM | POA: Diagnosis not present

## 2021-01-26 DIAGNOSIS — I509 Heart failure, unspecified: Secondary | ICD-10-CM | POA: Diagnosis not present

## 2021-01-26 DIAGNOSIS — Z794 Long term (current) use of insulin: Secondary | ICD-10-CM | POA: Diagnosis not present

## 2021-01-26 DIAGNOSIS — E1151 Type 2 diabetes mellitus with diabetic peripheral angiopathy without gangrene: Secondary | ICD-10-CM | POA: Diagnosis not present

## 2021-01-26 DIAGNOSIS — Z7951 Long term (current) use of inhaled steroids: Secondary | ICD-10-CM | POA: Diagnosis not present

## 2021-01-26 DIAGNOSIS — Z89511 Acquired absence of right leg below knee: Secondary | ICD-10-CM | POA: Diagnosis not present

## 2021-01-26 DIAGNOSIS — I132 Hypertensive heart and chronic kidney disease with heart failure and with stage 5 chronic kidney disease, or end stage renal disease: Secondary | ICD-10-CM | POA: Diagnosis not present

## 2021-01-26 DIAGNOSIS — E1169 Type 2 diabetes mellitus with other specified complication: Secondary | ICD-10-CM | POA: Diagnosis not present

## 2021-01-26 DIAGNOSIS — Z7982 Long term (current) use of aspirin: Secondary | ICD-10-CM | POA: Diagnosis not present

## 2021-01-28 DIAGNOSIS — G4733 Obstructive sleep apnea (adult) (pediatric): Secondary | ICD-10-CM | POA: Diagnosis not present

## 2021-01-28 DIAGNOSIS — E785 Hyperlipidemia, unspecified: Secondary | ICD-10-CM | POA: Diagnosis not present

## 2021-01-28 DIAGNOSIS — F1020 Alcohol dependence, uncomplicated: Secondary | ICD-10-CM | POA: Diagnosis not present

## 2021-01-28 DIAGNOSIS — E11319 Type 2 diabetes mellitus with unspecified diabetic retinopathy without macular edema: Secondary | ICD-10-CM | POA: Diagnosis not present

## 2021-01-28 DIAGNOSIS — Z89512 Acquired absence of left leg below knee: Secondary | ICD-10-CM | POA: Diagnosis not present

## 2021-01-28 DIAGNOSIS — Z7951 Long term (current) use of inhaled steroids: Secondary | ICD-10-CM | POA: Diagnosis not present

## 2021-01-28 DIAGNOSIS — Z7902 Long term (current) use of antithrombotics/antiplatelets: Secondary | ICD-10-CM | POA: Diagnosis not present

## 2021-01-28 DIAGNOSIS — N185 Chronic kidney disease, stage 5: Secondary | ICD-10-CM | POA: Diagnosis not present

## 2021-01-28 DIAGNOSIS — Z89511 Acquired absence of right leg below knee: Secondary | ICD-10-CM | POA: Diagnosis not present

## 2021-01-28 DIAGNOSIS — Z7982 Long term (current) use of aspirin: Secondary | ICD-10-CM | POA: Diagnosis not present

## 2021-01-28 DIAGNOSIS — Z794 Long term (current) use of insulin: Secondary | ICD-10-CM | POA: Diagnosis not present

## 2021-01-28 DIAGNOSIS — D649 Anemia, unspecified: Secondary | ICD-10-CM | POA: Diagnosis not present

## 2021-01-28 DIAGNOSIS — I132 Hypertensive heart and chronic kidney disease with heart failure and with stage 5 chronic kidney disease, or end stage renal disease: Secondary | ICD-10-CM | POA: Diagnosis not present

## 2021-01-28 DIAGNOSIS — E1169 Type 2 diabetes mellitus with other specified complication: Secondary | ICD-10-CM | POA: Diagnosis not present

## 2021-01-28 DIAGNOSIS — I509 Heart failure, unspecified: Secondary | ICD-10-CM | POA: Diagnosis not present

## 2021-01-28 DIAGNOSIS — E1151 Type 2 diabetes mellitus with diabetic peripheral angiopathy without gangrene: Secondary | ICD-10-CM | POA: Diagnosis not present

## 2021-01-31 DIAGNOSIS — E1169 Type 2 diabetes mellitus with other specified complication: Secondary | ICD-10-CM | POA: Diagnosis not present

## 2021-01-31 DIAGNOSIS — E1151 Type 2 diabetes mellitus with diabetic peripheral angiopathy without gangrene: Secondary | ICD-10-CM | POA: Diagnosis not present

## 2021-01-31 DIAGNOSIS — Z7902 Long term (current) use of antithrombotics/antiplatelets: Secondary | ICD-10-CM | POA: Diagnosis not present

## 2021-01-31 DIAGNOSIS — E785 Hyperlipidemia, unspecified: Secondary | ICD-10-CM | POA: Diagnosis not present

## 2021-01-31 DIAGNOSIS — D649 Anemia, unspecified: Secondary | ICD-10-CM | POA: Diagnosis not present

## 2021-01-31 DIAGNOSIS — Z7951 Long term (current) use of inhaled steroids: Secondary | ICD-10-CM | POA: Diagnosis not present

## 2021-01-31 DIAGNOSIS — I132 Hypertensive heart and chronic kidney disease with heart failure and with stage 5 chronic kidney disease, or end stage renal disease: Secondary | ICD-10-CM | POA: Diagnosis not present

## 2021-01-31 DIAGNOSIS — I509 Heart failure, unspecified: Secondary | ICD-10-CM | POA: Diagnosis not present

## 2021-01-31 DIAGNOSIS — N185 Chronic kidney disease, stage 5: Secondary | ICD-10-CM | POA: Diagnosis not present

## 2021-01-31 DIAGNOSIS — Z89512 Acquired absence of left leg below knee: Secondary | ICD-10-CM | POA: Diagnosis not present

## 2021-01-31 DIAGNOSIS — E11319 Type 2 diabetes mellitus with unspecified diabetic retinopathy without macular edema: Secondary | ICD-10-CM | POA: Diagnosis not present

## 2021-01-31 DIAGNOSIS — Z7982 Long term (current) use of aspirin: Secondary | ICD-10-CM | POA: Diagnosis not present

## 2021-01-31 DIAGNOSIS — F1020 Alcohol dependence, uncomplicated: Secondary | ICD-10-CM | POA: Diagnosis not present

## 2021-01-31 DIAGNOSIS — G4733 Obstructive sleep apnea (adult) (pediatric): Secondary | ICD-10-CM | POA: Diagnosis not present

## 2021-01-31 DIAGNOSIS — Z794 Long term (current) use of insulin: Secondary | ICD-10-CM | POA: Diagnosis not present

## 2021-01-31 DIAGNOSIS — Z89511 Acquired absence of right leg below knee: Secondary | ICD-10-CM | POA: Diagnosis not present

## 2021-02-01 DIAGNOSIS — G4733 Obstructive sleep apnea (adult) (pediatric): Secondary | ICD-10-CM | POA: Diagnosis not present

## 2021-02-01 DIAGNOSIS — I132 Hypertensive heart and chronic kidney disease with heart failure and with stage 5 chronic kidney disease, or end stage renal disease: Secondary | ICD-10-CM | POA: Diagnosis not present

## 2021-02-01 DIAGNOSIS — E1151 Type 2 diabetes mellitus with diabetic peripheral angiopathy without gangrene: Secondary | ICD-10-CM | POA: Diagnosis not present

## 2021-02-01 DIAGNOSIS — E11319 Type 2 diabetes mellitus with unspecified diabetic retinopathy without macular edema: Secondary | ICD-10-CM | POA: Diagnosis not present

## 2021-02-01 DIAGNOSIS — Z7982 Long term (current) use of aspirin: Secondary | ICD-10-CM | POA: Diagnosis not present

## 2021-02-01 DIAGNOSIS — Z7951 Long term (current) use of inhaled steroids: Secondary | ICD-10-CM | POA: Diagnosis not present

## 2021-02-01 DIAGNOSIS — F1020 Alcohol dependence, uncomplicated: Secondary | ICD-10-CM | POA: Diagnosis not present

## 2021-02-01 DIAGNOSIS — N185 Chronic kidney disease, stage 5: Secondary | ICD-10-CM | POA: Diagnosis not present

## 2021-02-01 DIAGNOSIS — I509 Heart failure, unspecified: Secondary | ICD-10-CM | POA: Diagnosis not present

## 2021-02-01 DIAGNOSIS — Z794 Long term (current) use of insulin: Secondary | ICD-10-CM | POA: Diagnosis not present

## 2021-02-01 DIAGNOSIS — Z7902 Long term (current) use of antithrombotics/antiplatelets: Secondary | ICD-10-CM | POA: Diagnosis not present

## 2021-02-01 DIAGNOSIS — D649 Anemia, unspecified: Secondary | ICD-10-CM | POA: Diagnosis not present

## 2021-02-01 DIAGNOSIS — E785 Hyperlipidemia, unspecified: Secondary | ICD-10-CM | POA: Diagnosis not present

## 2021-02-01 DIAGNOSIS — E1169 Type 2 diabetes mellitus with other specified complication: Secondary | ICD-10-CM | POA: Diagnosis not present

## 2021-02-01 DIAGNOSIS — Z89511 Acquired absence of right leg below knee: Secondary | ICD-10-CM | POA: Diagnosis not present

## 2021-02-01 DIAGNOSIS — Z89512 Acquired absence of left leg below knee: Secondary | ICD-10-CM | POA: Diagnosis not present

## 2021-02-02 DIAGNOSIS — D631 Anemia in chronic kidney disease: Secondary | ICD-10-CM | POA: Diagnosis not present

## 2021-02-02 LAB — CBC AND DIFFERENTIAL
HCT: 30 — AB (ref 36–46)
Hemoglobin: 10.2 — AB (ref 12.0–16.0)
Platelets: 227 (ref 150–399)
WBC: 8

## 2021-02-02 LAB — BASIC METABOLIC PANEL
BUN: 112 — AB (ref 4–21)
CO2: 22 (ref 13–22)
Chloride: 92 — AB (ref 99–108)
Creatinine: 9.8 — AB (ref 0.5–1.1)
Glucose: 103
Potassium: 4.3 (ref 3.4–5.3)
Sodium: 136 — AB (ref 137–147)

## 2021-02-02 LAB — HEPATIC FUNCTION PANEL
ALT: 14 (ref 7–35)
AST: 16 (ref 13–35)
Alkaline Phosphatase: 80 (ref 25–125)

## 2021-02-02 LAB — COMPREHENSIVE METABOLIC PANEL
Calcium: 10.4 (ref 8.7–10.7)
GFR calc Af Amer: 4

## 2021-02-07 DIAGNOSIS — I10 Essential (primary) hypertension: Secondary | ICD-10-CM | POA: Diagnosis not present

## 2021-02-07 DIAGNOSIS — E1121 Type 2 diabetes mellitus with diabetic nephropathy: Secondary | ICD-10-CM | POA: Diagnosis not present

## 2021-02-07 DIAGNOSIS — N185 Chronic kidney disease, stage 5: Secondary | ICD-10-CM | POA: Diagnosis not present

## 2021-02-07 DIAGNOSIS — D631 Anemia in chronic kidney disease: Secondary | ICD-10-CM | POA: Diagnosis not present

## 2021-02-23 DIAGNOSIS — N185 Chronic kidney disease, stage 5: Secondary | ICD-10-CM | POA: Diagnosis not present

## 2021-02-23 DIAGNOSIS — E1122 Type 2 diabetes mellitus with diabetic chronic kidney disease: Secondary | ICD-10-CM | POA: Diagnosis not present

## 2021-02-23 DIAGNOSIS — E1159 Type 2 diabetes mellitus with other circulatory complications: Secondary | ICD-10-CM | POA: Diagnosis not present

## 2021-02-23 DIAGNOSIS — E1169 Type 2 diabetes mellitus with other specified complication: Secondary | ICD-10-CM | POA: Diagnosis not present

## 2021-02-25 DIAGNOSIS — Z1231 Encounter for screening mammogram for malignant neoplasm of breast: Secondary | ICD-10-CM | POA: Diagnosis not present

## 2021-02-25 DIAGNOSIS — N6321 Unspecified lump in the left breast, upper outer quadrant: Secondary | ICD-10-CM | POA: Diagnosis not present

## 2021-02-25 LAB — HM DIABETES EYE EXAM

## 2021-02-25 LAB — HM MAMMOGRAPHY

## 2021-02-28 DIAGNOSIS — Z89511 Acquired absence of right leg below knee: Secondary | ICD-10-CM | POA: Diagnosis not present

## 2021-02-28 DIAGNOSIS — Z89512 Acquired absence of left leg below knee: Secondary | ICD-10-CM | POA: Diagnosis not present

## 2021-03-03 ENCOUNTER — Encounter: Payer: Self-pay | Admitting: Internal Medicine

## 2021-03-03 ENCOUNTER — Ambulatory Visit (INDEPENDENT_AMBULATORY_CARE_PROVIDER_SITE_OTHER): Payer: Federal, State, Local not specified - PPO | Admitting: Internal Medicine

## 2021-03-03 ENCOUNTER — Other Ambulatory Visit: Payer: Self-pay

## 2021-03-03 VITALS — BP 116/68 | HR 80 | Temp 98.4°F | Resp 14 | Ht 69.0 in | Wt 251.5 lb

## 2021-03-03 DIAGNOSIS — N185 Chronic kidney disease, stage 5: Secondary | ICD-10-CM

## 2021-03-03 DIAGNOSIS — F410 Panic disorder [episodic paroxysmal anxiety] without agoraphobia: Secondary | ICD-10-CM

## 2021-03-03 DIAGNOSIS — Z6837 Body mass index (BMI) 37.0-37.9, adult: Secondary | ICD-10-CM

## 2021-03-03 DIAGNOSIS — Z0000 Encounter for general adult medical examination without abnormal findings: Secondary | ICD-10-CM | POA: Diagnosis not present

## 2021-03-03 DIAGNOSIS — Z1231 Encounter for screening mammogram for malignant neoplasm of breast: Secondary | ICD-10-CM | POA: Diagnosis not present

## 2021-03-03 DIAGNOSIS — Z89511 Acquired absence of right leg below knee: Secondary | ICD-10-CM | POA: Diagnosis not present

## 2021-03-03 DIAGNOSIS — E1122 Type 2 diabetes mellitus with diabetic chronic kidney disease: Secondary | ICD-10-CM | POA: Diagnosis not present

## 2021-03-03 DIAGNOSIS — E1169 Type 2 diabetes mellitus with other specified complication: Secondary | ICD-10-CM

## 2021-03-03 DIAGNOSIS — L308 Other specified dermatitis: Secondary | ICD-10-CM | POA: Diagnosis not present

## 2021-03-03 DIAGNOSIS — E785 Hyperlipidemia, unspecified: Secondary | ICD-10-CM | POA: Diagnosis not present

## 2021-03-03 DIAGNOSIS — I739 Peripheral vascular disease, unspecified: Secondary | ICD-10-CM

## 2021-03-03 MED ORDER — TRIAMCINOLONE ACETONIDE 0.1 % EX CREA: 1.0000 "application " | TOPICAL_CREAM | Freq: Two times a day (BID) | CUTANEOUS | 1 refills | Status: DC

## 2021-03-03 MED ORDER — HYDROXYZINE PAMOATE 25 MG PO CAPS
25.0000 mg | ORAL_CAPSULE | Freq: Three times a day (TID) | ORAL | 0 refills | Status: DC | PRN
Start: 2021-03-03 — End: 2021-05-17

## 2021-03-03 NOTE — Progress Notes (Signed)
Date:  03/03/2021   Name:  Cindy Osborne   DOB:  1965-09-13   MRN:  [id number]   Chief Complaint: Annual Exam (No breast exam. Just had mammo. ) Daureen Wassell is a 55 y.o. female who presents today for her Complete Annual Exam. She feels well. She reports exercising - walking/ PT. She reports she is sleeping fairly well. Breast complaints - none.  Mammogram: 02/2021 left breast mass UOQ- additional views needed DEXA: none Pap smear: 09/2018 Colonoscopy: 04/2015 normal Eye exam - recently done; report to be sent  Immunization History  Administered Date(s) Administered   Hepatitis B, adult 10/09/2019, 02/03/2020   Influenza Inj Mdck Quad With Preservative 05/27/2018   Influenza,inj,Quad PF,6+ Mos 04/09/2019, 05/27/2020   Influenza-Unspecified 06/10/2016, 04/23/2017   PFIZER(Purple Top)SARS-COV-2 Vaccination 11/13/2019, 12/04/2019, 09/27/2020   Pneumococcal Polysaccharide-23 02/26/2019    Diabetes She presents for her follow-up (now seeing Dr. Honor Junes) diabetic visit. She has type 2 diabetes mellitus. Pertinent negatives for hypoglycemia include no dizziness, headaches, nervousness/anxiousness or tremors. Pertinent negatives for diabetes include no chest pain, no fatigue, no polydipsia and no polyuria. Symptoms are stable. Diabetic complications include PVD and retinopathy.  Hypertension This is a chronic (followed and treated by Nephrology) problem. The problem is resistant. Pertinent negatives include no chest pain, headaches, palpitations or shortness of breath. Past treatments include calcium channel blockers, beta blockers and diuretics. The current treatment provides moderate improvement. Hypertensive end-organ damage includes kidney disease, PVD and retinopathy.  Hyperlipidemia This is a chronic problem. The problem is controlled. Pertinent negatives include no chest pain or shortness of breath. Current antihyperlipidemic treatment includes statins (tolerating pravastatin).   PVD - now s/p bilateral BKA.  Doing well with prostheses and walker; still working with PTx. Rash - scaly area on stump under rubber prosthetic cup - itching at times.  Lab Results  Component Value Date   CREATININE 9.8 (A) 02/02/2021   BUN 112 (A) 02/02/2021   NA 136 (A) 02/02/2021   K 4.3 02/02/2021   CL 92 (A) 02/02/2021   CO2 22 02/02/2021   Lab Results  Component Value Date   CHOL 177 06/30/2020   HDL 31 (L) 06/30/2020   LDLCALC 112 (H) 06/30/2020   TRIG 191 (H) 06/30/2020   CHOLHDL 5.7 (H) 06/30/2020   Lab Results  Component Value Date   TSH 2.070 02/26/2020   Lab Results  Component Value Date   HGBA1C 6.3 12/30/2020   Lab Results  Component Value Date   WBC 8.0 02/02/2021   HGB 10.2 (A) 02/02/2021   HCT 30 (A) 02/02/2021   MCV 91 02/26/2020   PLT 227 02/02/2021   Lab Results  Component Value Date   ALT 14 02/02/2021   AST 16 02/02/2021   ALKPHOS 80 02/02/2021   BILITOT 0.2 06/30/2020     Review of Systems  Constitutional:  Negative for chills, fatigue and fever.  HENT:  Negative for congestion, hearing loss, tinnitus, trouble swallowing and voice change.   Eyes:  Negative for visual disturbance.  Respiratory:  Negative for cough, chest tightness, shortness of breath and wheezing.   Cardiovascular:  Negative for chest pain, palpitations and leg swelling.  Gastrointestinal:  Negative for abdominal pain, constipation, diarrhea and vomiting.  Endocrine: Negative for polydipsia and polyuria.  Genitourinary:  Negative for dysuria, frequency, genital sores, vaginal bleeding and vaginal discharge.  Musculoskeletal:  Positive for gait problem (bilateral BKA). Negative for arthralgias and joint swelling.  Skin:  Positive for rash. Negative for color  change.  Neurological:  Negative for dizziness, tremors, light-headedness and headaches.  Hematological:  Negative for adenopathy. Does not bruise/bleed easily.  Psychiatric/Behavioral:  Negative for dysphoric mood  and sleep disturbance. The patient is not nervous/anxious.    Patient Active Problem List   Diagnosis Date Noted   Acquired absence of left leg below knee ([place]) 10/26/2020   PVD (peripheral vascular disease) ([place]) 07/28/2020   Uncontrolled type 2 diabetes mellitus with diabetic peripheral angiopathy without gangrene ([place]) 02/26/2020   Acquired absence of right leg below knee ([place]) 07/28/2019   Diabetic retinopathy ([place]) 11/23/2016   Slow transit constipation 09/24/2015   Environmental and seasonal allergies 07/09/2015   Anemia associated with chronic renal failure 07/09/2015   CKD stage 5 due to type 2 diabetes mellitus ([place]) 07/09/2015   Type II diabetes mellitus with complication ([place]) [id number]   Herpes simplex infection 07/09/2015   Hyperlipidemia associated with type 2 diabetes mellitus ([place]) 07/09/2015   Heart & renal disease, hypertensive, with heart failure ([place]) 07/09/2015   Arthritis of knee, degenerative 07/09/2015   Tendinitis 07/09/2015   Avitaminosis D 07/09/2015   Body mass index (BMI) of 40.0-44.9 in adult [facility]) 06/07/2015   Obstructive apnea 12/15/2014   Intracervical pessary 01/07/2014   Essential hypertension 09/11/2013   Type 2 diabetes mellitus with diabetic nephropathy ([place]) 09/11/2013    Allergies  Allergen Reactions   Atorvastatin     Other reaction(s): Other (See Comments) Back pain   Peanut-Containing Drug Products Itching   Pioglitazone     Other reaction(s): Edema   Rosuvastatin Itching   Shellfish-Derived Products Rash    shrimp   Sulfa Antibiotics Rash    Past Surgical History:  Procedure Laterality Date   angioplasty politeal Left 07/2020   avg for dialysis     CHOLECYSTECTOMY     COLONOSCOPY  08/15/2011   cleared for 10 yrs   LEG AMPUTATION BELOW KNEE Right 04/2019   LEG AMPUTATION BELOW KNEE Left 09/08/2020   TOE AMPUTATION Right 10/15/2018    Social History   Tobacco Use   Smoking status: Never   Smokeless tobacco: Never   Substance Use Topics   Alcohol use: Yes    Comment: less than monthly   Drug use: No     Medication list has been reviewed and updated.  Current Meds  Medication Sig   Acetaminophen (TYLENOL PO) Take by mouth.   albuterol (VENTOLIN HFA) 108 (90 Base) MCG/ACT inhaler TAKE 2 PUFFS BY MOUTH EVERY 6 HOURS AS NEEDED FOR WHEEZE OR SHORTNESS OF BREATH   amLODipine (NORVASC) 10 MG tablet Take 1 tablet by mouth daily.   aspirin 81 MG EC tablet Take by mouth.   ferric citrate (AURYXIA) 1 GM 210 MG(Fe) tablet 1 tablet at breakfast, 2 at lunch, and 1 dinner   fluticasone (FLONASE) 50 MCG/ACT nasal spray Place 2 sprays into both nostrils daily.   furosemide (LASIX) 80 MG tablet Take 80 mg by mouth every morning.   hydrochlorothiazide (HYDRODIURIL) 50 MG tablet Take 2 tablets by mouth daily.    hydrOXYzine (VISTARIL) 25 MG capsule Take 1 capsule (25 mg total) by mouth 3 (three) times daily as needed for anxiety.   insulin glargine (LANTUS) 100 UNIT/ML Solostar Pen Inject 24 Units into the skin daily.   levonorgestrel (MIRENA) 20 MCG/24HR IUD by Intrauterine route.   metoprolol (TOPROL-XL) 200 MG 24 hr tablet Take 200 mg by mouth daily.   montelukast (SINGULAIR) 10 MG tablet Take 1 tablet (10 mg total) by  mouth at bedtime. (Patient taking differently: Take 10 mg by mouth as needed.)   mupirocin ointment (BACTROBAN) 2 % mupirocin 2 % topical ointment   pravastatin (PRAVACHOL) 20 MG tablet TAKE 1 TABLET BY MOUTH EVERY DAY   sennosides-docusate sodium (SENOKOT-S) 8.6-50 MG tablet Take 1 tablet by mouth as needed for constipation.   sodium bicarbonate 650 MG tablet Take by mouth 3 (three) times daily.   [DISCONTINUED] ASPIRIN 81 PO SMARTSIG:Tablet(s) By Mouth   [DISCONTINUED] Cholecalciferol 25 MCG (1000 UT) tablet Take by mouth daily. Takes 3 tablets   [DISCONTINUED] triamcinolone cream (KENALOG) 0.1 % Apply 1 application topically 2 (two) times daily. To rash on leg    PHQ 2/9 Scores 03/03/2021  10/26/2020 07/28/2020 06/30/2020  PHQ - 2 Score 0 0 0 0  PHQ- 9 Score 0 0 0 0    GAD 7 : Generalized Anxiety Score 03/03/2021 10/26/2020 07/28/2020 06/30/2020  Nervous, Anxious, on Edge 0 1 0 0  Control/stop worrying 0 2 0 0  Worry too much - different things 0 2 0 0  Trouble relaxing 1 0 0 0  Restless 0 0 0 0  Easily annoyed or irritable 1 0 0 0  Afraid - awful might happen 0 0 0 0  Total GAD 7 Score 2 5 0 0  Anxiety Difficulty Not difficult at all Not difficult at all - -    BP Readings from Last 3 Encounters:  03/03/21 116/68  10/26/20 (!) 170/88  07/28/20 128/80    Physical Exam Vitals and nursing note reviewed.  Constitutional:      General: She is not in acute distress.    Appearance: She is well-developed.  HENT:     Head: Normocephalic and atraumatic.     Right Ear: Tympanic membrane and ear canal normal.     Left Ear: Tympanic membrane and ear canal normal.     Nose:     Right Sinus: No maxillary sinus tenderness.     Left Sinus: No maxillary sinus tenderness.  Eyes:     General: No scleral icterus.       Right eye: No discharge.        Left eye: No discharge.     Conjunctiva/sclera: Conjunctivae normal.  Neck:     Thyroid: No thyromegaly.     Vascular: No carotid bruit.  Cardiovascular:     Rate and Rhythm: Normal rate and regular rhythm.     Pulses: Normal pulses.     Heart sounds: Normal heart sounds.  Pulmonary:     Effort: Pulmonary effort is normal. No respiratory distress.     Breath sounds: No wheezing.  Chest:  Breasts:    Right: No mass, nipple discharge, skin change or tenderness.     Left: No mass, nipple discharge, skin change or tenderness.  Abdominal:     General: Bowel sounds are normal.     Palpations: Abdomen is soft.     Tenderness: There is no abdominal tenderness.  Musculoskeletal:     Cervical back: Normal range of motion. No erythema.     Right lower leg: No edema.     Left lower leg: No edema.     Right Lower Extremity: Right  leg is amputated below knee.     Left Lower Extremity: Left leg is amputated below knee.  Lymphadenopathy:     Cervical: No cervical adenopathy.  Skin:    General: Skin is warm and dry.     Findings: Rash (peeling dry skin  on left knee and area of dry skin below the left knee anteriorly) present.  Neurological:     Mental Status: She is alert and oriented to person, place, and time.     Cranial Nerves: No cranial nerve deficit.     Sensory: No sensory deficit.     Deep Tendon Reflexes: Reflexes are normal and symmetric.  Psychiatric:        Attention and Perception: Attention normal.        Mood and Affect: Mood normal.    Wt Readings from Last 3 Encounters:  03/03/21 251 lb 8 oz (114.1 kg)  10/26/20 280 lb (127 kg)  07/28/20 280 lb (127 kg)    BP 116/68 (BP Location: Right Arm, Patient Position: Sitting, Cuff Size: Large)   Pulse 80   Temp 98.4 F (36.9 C) (Oral)   Resp 14   Ht '5\' 9"'$  (1.753 m)   Wt 251 lb 8 oz (114.1 kg)   SpO2 100%   BMI 37.14 kg/m   Assessment and Plan: 1. Annual physical exam Exam is normal except for weight. Encourage appropriate dietary changes. Up to date on screenings and immunizations  2. Encounter for screening mammogram for breast cancer Follow up on left breast mass planned with [place] [facility]  3. Panic disorder Having more anxiety since second amputation.  Mostly at night once she lies down to sleep -  Will try vistaril PRN - TSH - hydrOXYzine (VISTARIL) 25 MG capsule; Take 1 capsule (25 mg total) by mouth 3 (three) times daily as needed for anxiety.  Dispense: 30 capsule; Refill: 0  4. Hyperlipidemia associated with type 2 diabetes mellitus ([place]) Tolerating statin medication without side effects at this time LDL is not at goal of < 70 on current dose Continue same therapy without change at this time. She has not tolerated other statins - Lipid panel  5. Acquired absence of right leg below knee ([facility])  6. CKD stage 5 due to type 2  diabetes mellitus ([place]) Followed by Nephrology Has AVG but has not started HD yet  7. PVD (peripheral vascular disease) ([place]) Now s/p bilateral BKAs  8. BMI 37.0-37.9, adult Continue to work on diet changes  9. Other eczema Noted on left BKA and left knee - triamcinolone cream (KENALOG) 0.1 %; Apply 1 application topically 2 (two) times daily. To rash on leg  Dispense: 30 g; Refill: 1   Partially dictated using Editor, commissioning. Any errors are unintentional.  Halina Maidens, MD [facility]  03/03/2021

## 2021-03-04 LAB — LIPID PANEL
Chol/HDL Ratio: 4.8 ratio — ABNORMAL HIGH (ref 0.0–4.4)
Cholesterol, Total: 168 mg/dL (ref 100–199)
HDL: 35 mg/dL — ABNORMAL LOW (ref >39)
LDL Chol Calc (NIH): 113 mg/dL — ABNORMAL HIGH (ref 0–99)
Triglycerides: 107 mg/dL (ref 0–149)
VLDL Cholesterol Cal: 20 mg/dL (ref 5–40)

## 2021-03-04 LAB — TSH: TSH: 3.12 u[IU]/mL (ref 0.450–4.500)

## 2021-03-06 ENCOUNTER — Encounter: Payer: Self-pay | Admitting: Internal Medicine

## 2021-03-07 DIAGNOSIS — Z89512 Acquired absence of left leg below knee: Secondary | ICD-10-CM | POA: Diagnosis not present

## 2021-03-07 DIAGNOSIS — Z89511 Acquired absence of right leg below knee: Secondary | ICD-10-CM | POA: Diagnosis not present

## 2021-03-08 DIAGNOSIS — N6320 Unspecified lump in the left breast, unspecified quadrant: Secondary | ICD-10-CM | POA: Diagnosis not present

## 2021-03-08 DIAGNOSIS — N6321 Unspecified lump in the left breast, upper outer quadrant: Secondary | ICD-10-CM | POA: Diagnosis not present

## 2021-03-08 DIAGNOSIS — N6002 Solitary cyst of left breast: Secondary | ICD-10-CM | POA: Diagnosis not present

## 2021-03-15 DIAGNOSIS — Z01419 Encounter for gynecological examination (general) (routine) without abnormal findings: Secondary | ICD-10-CM | POA: Diagnosis not present

## 2021-03-15 DIAGNOSIS — Z30433 Encounter for removal and reinsertion of intrauterine contraceptive device: Secondary | ICD-10-CM | POA: Diagnosis not present

## 2021-03-15 DIAGNOSIS — Z30430 Encounter for insertion of intrauterine contraceptive device: Secondary | ICD-10-CM | POA: Insufficient documentation

## 2021-03-22 DIAGNOSIS — Z89512 Acquired absence of left leg below knee: Secondary | ICD-10-CM | POA: Diagnosis not present

## 2021-03-22 DIAGNOSIS — Z89511 Acquired absence of right leg below knee: Secondary | ICD-10-CM | POA: Diagnosis not present

## 2021-03-26 ENCOUNTER — Other Ambulatory Visit: Payer: Self-pay | Admitting: Internal Medicine

## 2021-03-26 DIAGNOSIS — E1169 Type 2 diabetes mellitus with other specified complication: Secondary | ICD-10-CM

## 2021-03-28 DIAGNOSIS — E113211 Type 2 diabetes mellitus with mild nonproliferative diabetic retinopathy with macular edema, right eye: Secondary | ICD-10-CM | POA: Diagnosis not present

## 2021-03-28 DIAGNOSIS — H25813 Combined forms of age-related cataract, bilateral: Secondary | ICD-10-CM | POA: Diagnosis not present

## 2021-03-29 DIAGNOSIS — Z89512 Acquired absence of left leg below knee: Secondary | ICD-10-CM | POA: Diagnosis not present

## 2021-03-29 DIAGNOSIS — N185 Chronic kidney disease, stage 5: Secondary | ICD-10-CM | POA: Diagnosis not present

## 2021-03-29 DIAGNOSIS — D631 Anemia in chronic kidney disease: Secondary | ICD-10-CM | POA: Diagnosis not present

## 2021-03-29 DIAGNOSIS — Z89511 Acquired absence of right leg below knee: Secondary | ICD-10-CM | POA: Diagnosis not present

## 2021-03-31 DIAGNOSIS — E785 Hyperlipidemia, unspecified: Secondary | ICD-10-CM | POA: Diagnosis not present

## 2021-03-31 DIAGNOSIS — I10 Essential (primary) hypertension: Secondary | ICD-10-CM | POA: Diagnosis not present

## 2021-03-31 DIAGNOSIS — N185 Chronic kidney disease, stage 5: Secondary | ICD-10-CM | POA: Diagnosis not present

## 2021-03-31 DIAGNOSIS — E1121 Type 2 diabetes mellitus with diabetic nephropathy: Secondary | ICD-10-CM | POA: Diagnosis not present

## 2021-04-05 DIAGNOSIS — Z89512 Acquired absence of left leg below knee: Secondary | ICD-10-CM | POA: Diagnosis not present

## 2021-04-05 DIAGNOSIS — Z89511 Acquired absence of right leg below knee: Secondary | ICD-10-CM | POA: Diagnosis not present

## 2021-04-11 DIAGNOSIS — N184 Chronic kidney disease, stage 4 (severe): Secondary | ICD-10-CM | POA: Diagnosis not present

## 2021-04-12 DIAGNOSIS — Z794 Long term (current) use of insulin: Secondary | ICD-10-CM | POA: Diagnosis not present

## 2021-04-12 DIAGNOSIS — N184 Chronic kidney disease, stage 4 (severe): Secondary | ICD-10-CM | POA: Diagnosis not present

## 2021-04-12 DIAGNOSIS — I10 Essential (primary) hypertension: Secondary | ICD-10-CM | POA: Diagnosis not present

## 2021-04-12 DIAGNOSIS — E1121 Type 2 diabetes mellitus with diabetic nephropathy: Secondary | ICD-10-CM | POA: Diagnosis not present

## 2021-04-19 DIAGNOSIS — Z89512 Acquired absence of left leg below knee: Secondary | ICD-10-CM | POA: Diagnosis not present

## 2021-04-19 DIAGNOSIS — Z89511 Acquired absence of right leg below knee: Secondary | ICD-10-CM | POA: Diagnosis not present

## 2021-04-22 DIAGNOSIS — D631 Anemia in chronic kidney disease: Secondary | ICD-10-CM | POA: Diagnosis not present

## 2021-04-22 DIAGNOSIS — N184 Chronic kidney disease, stage 4 (severe): Secondary | ICD-10-CM | POA: Diagnosis not present

## 2021-04-22 DIAGNOSIS — Z975 Presence of (intrauterine) contraceptive device: Secondary | ICD-10-CM | POA: Diagnosis not present

## 2021-04-22 DIAGNOSIS — I739 Peripheral vascular disease, unspecified: Secondary | ICD-10-CM | POA: Diagnosis not present

## 2021-04-22 DIAGNOSIS — G4733 Obstructive sleep apnea (adult) (pediatric): Secondary | ICD-10-CM | POA: Diagnosis not present

## 2021-04-22 DIAGNOSIS — Z01818 Encounter for other preprocedural examination: Secondary | ICD-10-CM | POA: Diagnosis not present

## 2021-04-22 DIAGNOSIS — F410 Panic disorder [episodic paroxysmal anxiety] without agoraphobia: Secondary | ICD-10-CM | POA: Diagnosis not present

## 2021-04-22 DIAGNOSIS — N189 Chronic kidney disease, unspecified: Secondary | ICD-10-CM | POA: Diagnosis not present

## 2021-04-22 DIAGNOSIS — E1121 Type 2 diabetes mellitus with diabetic nephropathy: Secondary | ICD-10-CM | POA: Diagnosis not present

## 2021-04-22 DIAGNOSIS — Z89511 Acquired absence of right leg below knee: Secondary | ICD-10-CM | POA: Diagnosis not present

## 2021-04-22 DIAGNOSIS — Z794 Long term (current) use of insulin: Secondary | ICD-10-CM | POA: Diagnosis not present

## 2021-04-22 DIAGNOSIS — I10 Essential (primary) hypertension: Secondary | ICD-10-CM | POA: Diagnosis not present

## 2021-04-26 DIAGNOSIS — Z89512 Acquired absence of left leg below knee: Secondary | ICD-10-CM | POA: Diagnosis not present

## 2021-04-26 DIAGNOSIS — Z89511 Acquired absence of right leg below knee: Secondary | ICD-10-CM | POA: Diagnosis not present

## 2021-05-05 DIAGNOSIS — I129 Hypertensive chronic kidney disease with stage 1 through stage 4 chronic kidney disease, or unspecified chronic kidney disease: Secondary | ICD-10-CM | POA: Insufficient documentation

## 2021-05-05 DIAGNOSIS — I120 Hypertensive chronic kidney disease with stage 5 chronic kidney disease or end stage renal disease: Secondary | ICD-10-CM | POA: Diagnosis not present

## 2021-05-05 DIAGNOSIS — N185 Chronic kidney disease, stage 5: Secondary | ICD-10-CM | POA: Diagnosis not present

## 2021-05-05 DIAGNOSIS — D631 Anemia in chronic kidney disease: Secondary | ICD-10-CM | POA: Diagnosis not present

## 2021-05-05 DIAGNOSIS — E1121 Type 2 diabetes mellitus with diabetic nephropathy: Secondary | ICD-10-CM | POA: Diagnosis not present

## 2021-05-05 DIAGNOSIS — E872 Acidosis, unspecified: Secondary | ICD-10-CM | POA: Insufficient documentation

## 2021-05-06 DIAGNOSIS — Z89512 Acquired absence of left leg below knee: Secondary | ICD-10-CM | POA: Diagnosis not present

## 2021-05-06 DIAGNOSIS — Z89511 Acquired absence of right leg below knee: Secondary | ICD-10-CM | POA: Diagnosis not present

## 2021-05-12 DIAGNOSIS — Z89511 Acquired absence of right leg below knee: Secondary | ICD-10-CM | POA: Diagnosis not present

## 2021-05-12 DIAGNOSIS — Z89512 Acquired absence of left leg below knee: Secondary | ICD-10-CM | POA: Diagnosis not present

## 2021-05-16 ENCOUNTER — Other Ambulatory Visit: Payer: Self-pay | Admitting: Internal Medicine

## 2021-05-16 DIAGNOSIS — F410 Panic disorder [episodic paroxysmal anxiety] without agoraphobia: Secondary | ICD-10-CM

## 2021-05-25 DIAGNOSIS — Z89512 Acquired absence of left leg below knee: Secondary | ICD-10-CM | POA: Diagnosis not present

## 2021-05-25 DIAGNOSIS — Z4789 Encounter for other orthopedic aftercare: Secondary | ICD-10-CM | POA: Diagnosis not present

## 2021-05-25 DIAGNOSIS — Z89511 Acquired absence of right leg below knee: Secondary | ICD-10-CM | POA: Diagnosis not present

## 2021-05-30 DIAGNOSIS — Z89511 Acquired absence of right leg below knee: Secondary | ICD-10-CM | POA: Diagnosis not present

## 2021-05-30 DIAGNOSIS — Z4789 Encounter for other orthopedic aftercare: Secondary | ICD-10-CM | POA: Diagnosis not present

## 2021-05-30 DIAGNOSIS — Z89512 Acquired absence of left leg below knee: Secondary | ICD-10-CM | POA: Diagnosis not present

## 2021-06-06 DIAGNOSIS — Z89511 Acquired absence of right leg below knee: Secondary | ICD-10-CM | POA: Diagnosis not present

## 2021-06-06 DIAGNOSIS — Z4789 Encounter for other orthopedic aftercare: Secondary | ICD-10-CM | POA: Diagnosis not present

## 2021-06-06 DIAGNOSIS — Z89512 Acquired absence of left leg below knee: Secondary | ICD-10-CM | POA: Diagnosis not present

## 2021-06-07 ENCOUNTER — Ambulatory Visit: Payer: Self-pay | Admitting: *Deleted

## 2021-06-07 NOTE — Telephone Encounter (Signed)
Pt reports increased anxiety.  States onset in Jan after LLE amputation. Reports has worsened last 2 months, "The Hydroxyzine never really worked." States "Just feel like I have to move, restless, have to move my legs." Occurs mostly after P.T. and at night, varies during day. States once up and moving around for few hours, resolves. Has been taking hydroxyzine  3 times day/daily. No associated physical symptoms; denies sweating, no nausea, no rapid heart beat or SOB, no CP. No suicidal or homicidal ideation. "Just feel like I have to move." Reports awakens frequently at night, able to go back to sleep but awakens frequently all night. Has support group through P.T.  Offered appt, declines. "Just wonder if I could try another medication or add one." Advised may need appt. Assured pt NT would route to practice for PCPs review and final disposition. Pt verbalizes understanding.   CB#[phone]

## 2021-06-07 NOTE — Telephone Encounter (Signed)
Reason for Disposition  [1] Started on anti-anxiety medication AND [2] no relief  Answer Assessment - Initial Assessment Questions 1. CONCERN: "Did anything happen that prompted you to call today?"     Med just not helping 2. ANXIETY SYMPTOMS: "Can you describe how you (your loved one; patient) have been feeling?" (e.g., tense, restless, panicky, anxious, keyed up, overwhelmed, sense of impending doom).      Since amputation JAn 3. ONSET: "How long have you been feeling this way?" (e.g., hours, days, weeks)     Jan. Worsening past  4. SEVERITY: "How would you rate the level of anxiety?" (e.g., 0 - 10; or mild, moderate, severe).     7.5/10  Varies 5. FUNCTIONAL IMPAIRMENT: "How have these feelings affected your ability to do daily activities?" "Have you had more difficulty than usual doing your normal daily activities?" (e.g., getting better, same, worse; self-care, school, work, interactions)     "Need to keep moving." 6. HISTORY: "Have you felt this way before?" "Have you ever been diagnosed with an anxiety problem in the past?" (e.g., generalized anxiety disorder, panic attacks, PTSD). If Yes, ask: "How was this problem treated?" (e.g., medicines, counseling, etc.)     Yes after amputation 7. RISK OF HARM - SUICIDAL IDEATION: "Do you ever have thoughts of hurting or killing yourself?" If Yes, ask:  "Do you have these feelings now?" "Do you have a plan on how you would do this?"     no 8. TREATMENT:  "What has been done so far to treat this anxiety?" (e.g., medicines, relaxation strategies). "What has helped?"     Anxiety med 9. TREATMENT - THERAPIST: "Do you have a counselor or therapist? Name?"    Support group in [place].T.. 10. POTENTIAL TRIGGERS: "Do you drink caffeinated beverages (e.g., coffee, colas, teas), and how much daily?" "Do you drink alcohol or use any drugs?" "Have you started any new medicines recently?"     no 10. PATIENT SUPPORT: "Who is with you now?" "Who do you live with?"  "Do you have family or friends who you can talk to?"        yes 55. OTHER SYMPTOMS: "Do you have any other symptoms?" (e.g., feeling depressed, trouble concentrating, trouble sleeping, trouble breathing, palpitations or fast heartbeat, chest pain, sweating, nausea, or diarrhea)      None  Protocols used: Anxiety and Panic Attack-A-AH

## 2021-06-08 DIAGNOSIS — I120 Hypertensive chronic kidney disease with stage 5 chronic kidney disease or end stage renal disease: Secondary | ICD-10-CM | POA: Diagnosis not present

## 2021-06-08 DIAGNOSIS — N185 Chronic kidney disease, stage 5: Secondary | ICD-10-CM | POA: Diagnosis not present

## 2021-06-08 DIAGNOSIS — D631 Anemia in chronic kidney disease: Secondary | ICD-10-CM | POA: Diagnosis not present

## 2021-06-08 DIAGNOSIS — E1121 Type 2 diabetes mellitus with diabetic nephropathy: Secondary | ICD-10-CM | POA: Diagnosis not present

## 2021-06-09 ENCOUNTER — Other Ambulatory Visit: Payer: Self-pay

## 2021-06-09 ENCOUNTER — Encounter: Payer: Self-pay | Admitting: Internal Medicine

## 2021-06-09 ENCOUNTER — Ambulatory Visit (INDEPENDENT_AMBULATORY_CARE_PROVIDER_SITE_OTHER): Payer: Federal, State, Local not specified - PPO | Admitting: Internal Medicine

## 2021-06-09 VITALS — BP 126/72 | HR 78 | Ht 69.0 in | Wt 249.5 lb

## 2021-06-09 DIAGNOSIS — R300 Dysuria: Secondary | ICD-10-CM

## 2021-06-09 DIAGNOSIS — F39 Unspecified mood [affective] disorder: Secondary | ICD-10-CM

## 2021-06-09 DIAGNOSIS — G2581 Restless legs syndrome: Secondary | ICD-10-CM

## 2021-06-09 LAB — POC URINALYSIS WITH MICROSCOPIC (NON AUTO)MANUAL RESULT
Bilirubin, UA: NEGATIVE
Crystals: 0
Glucose, UA: POSITIVE — AB
Ketones, UA: NEGATIVE
Mucus, UA: 0
Nitrite, UA: NEGATIVE
Protein, UA: POSITIVE — AB
RBC: 5 M/uL (ref 4.04–5.48)
Spec Grav, UA: 1.015 (ref 1.010–1.025)
Urobilinogen, UA: 0.2 E.U./dL
WBC Casts, UA: 5
pH, UA: 7 (ref 5.0–8.0)

## 2021-06-09 MED ORDER — AMITRIPTYLINE HCL 25 MG PO TABS: 25.0000 mg | ORAL_TABLET | Freq: Every day | ORAL | 1 refills | Status: DC

## 2021-06-09 MED ORDER — LEVOFLOXACIN 500 MG PO TABS: 500.0000 mg | ORAL_TABLET | Freq: Every day | ORAL | 0 refills | Status: AC

## 2021-06-09 NOTE — Progress Notes (Signed)
Date:  06/09/2021   Name:  Cindy Osborne   DOB:  07/17/1966   MRN:  [id number]   Chief Complaint: restless leg  Anxiety Presents for follow-up visit. Symptoms include excessive worry, irritability, nervous/anxious behavior and restlessness. Patient reports no chest pain, confusion, feeling of choking, insomnia, shortness of breath or suicidal ideas. Symptoms occur most days.    Dysuria  This is a new problem. The current episode started in the past 7 days. The problem has been waxing and waning. The quality of the pain is described as aching. The patient is experiencing no pain. There has been no fever. Pertinent negatives include no chills.   Lab Results  Component Value Date   CREATININE 9.8 (A) 02/02/2021   BUN 112 (A) 02/02/2021   NA 136 (A) 02/02/2021   K 4.3 02/02/2021   CL 92 (A) 02/02/2021   CO2 22 02/02/2021   Lab Results  Component Value Date   CHOL 168 03/03/2021   HDL 35 (L) 03/03/2021   LDLCALC 113 (H) 03/03/2021   TRIG 107 03/03/2021   CHOLHDL 4.8 (H) 03/03/2021   Lab Results  Component Value Date   TSH 3.120 03/03/2021   Lab Results  Component Value Date   HGBA1C 6.3 12/30/2020   Lab Results  Component Value Date   WBC 8.0 02/02/2021   HGB 10.2 (A) 02/02/2021   HCT 30 (A) 02/02/2021   MCV 91 02/26/2020   PLT 227 02/02/2021   Lab Results  Component Value Date   ALT 14 02/02/2021   AST 16 02/02/2021   ALKPHOS 80 02/02/2021   BILITOT 0.2 06/30/2020     Review of Systems  Constitutional:  Positive for irritability. Negative for chills, fatigue and fever.  Respiratory:  Negative for chest tightness and shortness of breath.   Cardiovascular:  Negative for chest pain.  Gastrointestinal:  Negative for abdominal pain, constipation and diarrhea.  Genitourinary:  Positive for dysuria (cloudy urine).  Psychiatric/Behavioral:  Positive for dysphoric mood. Negative for confusion and suicidal ideas. The patient is nervous/anxious. The patient does  not have insomnia.    Patient Active Problem List   Diagnosis Date Noted   Panic disorder 03/03/2021   Acquired absence of left leg below knee ([place]) 10/26/2020   PVD (peripheral vascular disease) ([place]) 07/28/2020   Uncontrolled type 2 diabetes mellitus with diabetic peripheral angiopathy without gangrene 02/26/2020   Acquired absence of right leg below knee ([place]) 07/28/2019   Diabetic retinopathy ([place]) 11/23/2016   Slow transit constipation 09/24/2015   Environmental and seasonal allergies 07/09/2015   Anemia associated with chronic renal failure 07/09/2015   CKD stage 5 due to type 2 diabetes mellitus ([place]) 07/09/2015   Type II diabetes mellitus with complication ([place]) 38/25/0539   Herpes simplex infection 07/09/2015   Hyperlipidemia associated with type 2 diabetes mellitus ([place]) 07/09/2015   Heart & renal disease, hypertensive, with heart failure ([place]) 07/09/2015   Arthritis of knee, degenerative 07/09/2015   Tendinitis 07/09/2015   Avitaminosis D 07/09/2015   Obstructive apnea 12/15/2014   Intracervical pessary 01/07/2014   Essential hypertension 09/11/2013   Type 2 diabetes mellitus with diabetic nephropathy ([place]) 09/11/2013    Allergies  Allergen Reactions   Atorvastatin     Other reaction(s): Other (See Comments) Back pain   Peanut-Containing Drug Products Itching   Pioglitazone     Other reaction(s): Edema   Rosuvastatin Itching   Shellfish-Derived Products Rash    shrimp   Sulfa Antibiotics Rash  Past Surgical History:  Procedure Laterality Date   angioplasty politeal Left 07/2020   avg for dialysis     CHOLECYSTECTOMY     COLONOSCOPY  08/15/2011   cleared for 10 yrs   LEG AMPUTATION BELOW KNEE Right 04/2019   LEG AMPUTATION BELOW KNEE Left 09/08/2020   TOE AMPUTATION Right 10/15/2018    Social History   Tobacco Use   Smoking status: Never   Smokeless tobacco: Never  Substance Use Topics   Alcohol use: Yes    Comment: less than monthly   Drug  use: No     Medication list has been reviewed and updated.  Current Meds  Medication Sig   Acetaminophen (TYLENOL PO) Take by mouth.   albuterol (VENTOLIN HFA) 108 (90 Base) MCG/ACT inhaler TAKE 2 PUFFS BY MOUTH EVERY 6 HOURS AS NEEDED FOR WHEEZE OR SHORTNESS OF BREATH   amLODipine (NORVASC) 10 MG tablet Take 1 tablet by mouth daily.   aspirin 81 MG EC tablet Take by mouth.   fluticasone (FLONASE) 50 MCG/ACT nasal spray Place 2 sprays into both nostrils daily.   furosemide (LASIX) 80 MG tablet Take 80 mg by mouth every morning.   hydrochlorothiazide (HYDRODIURIL) 50 MG tablet Take 2 tablets by mouth daily.    hydrOXYzine (VISTARIL) 25 MG capsule TAKE 1 CAPSULE BY MOUTH 3 TIMES DAILY AS NEEDED FOR ANXIETY.   insulin aspart (NOVOLOG) 100 UNIT/ML FlexPen Inject 5 Units into the skin 3 (three) times daily before meals.   insulin glargine (LANTUS) 100 UNIT/ML Solostar Pen Inject 24 Units into the skin daily.   levonorgestrel (MIRENA) 20 MCG/24HR IUD by Intrauterine route.   metoprolol (TOPROL-XL) 200 MG 24 hr tablet Take 200 mg by mouth daily.   montelukast (SINGULAIR) 10 MG tablet Take 1 tablet (10 mg total) by mouth at bedtime. (Patient taking differently: Take 10 mg by mouth as needed.)   mupirocin ointment (BACTROBAN) 2 % mupirocin 2 % topical ointment   pravastatin (PRAVACHOL) 20 MG tablet TAKE 1 TABLET BY MOUTH EVERY DAY   sennosides-docusate sodium (SENOKOT-S) 8.6-50 MG tablet Take 1 tablet by mouth as needed for constipation.   sodium bicarbonate 650 MG tablet Take by mouth 3 (three) times daily.   sucroferric oxyhydroxide (VELPHORO) 500 MG chewable tablet Chew 500 mg by mouth in the morning and at bedtime.   triamcinolone cream (KENALOG) 0.1 % Apply 1 application topically 2 (two) times daily. To rash on leg    PHQ 2/9 Scores 06/09/2021 03/03/2021 10/26/2020 07/28/2020  PHQ - 2 Score 0 0 0 0  PHQ- 9 Score 3 0 0 0    GAD 7 : Generalized Anxiety Score 06/09/2021 03/03/2021 10/26/2020  07/28/2020  Nervous, Anxious, on Edge 3 0 1 0  Control/stop worrying 0 0 2 0  Worry too much - different things 1 0 2 0  Trouble relaxing 3 1 0 0  Restless 3 0 0 0  Easily annoyed or irritable 3 1 0 0  Afraid - awful might happen 0 0 0 0  Total GAD 7 Score 13 2 5  0  Anxiety Difficulty Not difficult at all Not difficult at all Not difficult at all -    BP Readings from Last 3 Encounters:  06/09/21 126/72  03/03/21 116/68  10/26/20 (!) 170/88    Physical Exam Vitals and nursing note reviewed.  Constitutional:      General: She is not in acute distress.    Appearance: She is well-developed.  HENT:  Head: Normocephalic and atraumatic.  Cardiovascular:     Rate and Rhythm: Normal rate and regular rhythm.     Pulses: Normal pulses.  Pulmonary:     Effort: Pulmonary effort is normal. No respiratory distress.     Breath sounds: No wheezing or rhonchi.  Musculoskeletal:     Cervical back: Normal range of motion.     Comments: Bilateral AKAs  Skin:    General: Skin is warm and dry.     Capillary Refill: Capillary refill takes less than 2 seconds.     Findings: No rash.  Neurological:     Mental Status: She is alert and oriented to person, place, and time.  Psychiatric:        Mood and Affect: Mood normal.        Behavior: Behavior normal.    Wt Readings from Last 3 Encounters:  06/09/21 249 lb 8 oz (113.2 kg)  03/03/21 251 lb 8 oz (114.1 kg)  10/26/20 280 lb (127 kg)    BP 126/72   Pulse 78   Ht 5\' 9"  (1.753 m)   Wt 249 lb 8 oz (113.2 kg)   SpO2 99%   BMI 36.84 kg/m   Assessment and Plan: 1. Mood disorder ([place]) She is not getting much relief from Vistaril; SSRIs and similar are contraindicated or unknown in RF/HD. Will start Elavil at bedtime - call if there are side effects - amitriptyline (ELAVIL) 25 MG tablet; Take 1 tablet (25 mg total) by mouth at bedtime.  Dispense: 30 tablet; Refill: 1  2. Restless legs syndrome She has a hx of restless leg symptoms  that have never been addressed She take tylenol and gets up to walk as needed Consider medication once she starts on HD (probably Jan)  3. Dysuria UA is positive; will give one dose of levofloxacin. She can discuss with Nephrology next week if sx persist - POC urinalysis w microscopic (non auto) - levofloxacin (LEVAQUIN) 500 MG tablet; Take 1 tablet (500 mg total) by mouth daily for 1 day.  Dispense: 1 tablet; Refill: 0   Partially dictated using Editor, commissioning. Any errors are unintentional.  Halina Maidens, MD [facility]  06/09/2021

## 2021-06-13 DIAGNOSIS — Z4789 Encounter for other orthopedic aftercare: Secondary | ICD-10-CM | POA: Diagnosis not present

## 2021-06-13 DIAGNOSIS — E211 Secondary hyperparathyroidism, not elsewhere classified: Secondary | ICD-10-CM | POA: Diagnosis not present

## 2021-06-13 DIAGNOSIS — Z89511 Acquired absence of right leg below knee: Secondary | ICD-10-CM | POA: Diagnosis not present

## 2021-06-13 DIAGNOSIS — R809 Proteinuria, unspecified: Secondary | ICD-10-CM | POA: Insufficient documentation

## 2021-06-13 DIAGNOSIS — Z89512 Acquired absence of left leg below knee: Secondary | ICD-10-CM | POA: Diagnosis not present

## 2021-06-13 DIAGNOSIS — D631 Anemia in chronic kidney disease: Secondary | ICD-10-CM | POA: Diagnosis not present

## 2021-06-13 DIAGNOSIS — N185 Chronic kidney disease, stage 5: Secondary | ICD-10-CM | POA: Diagnosis not present

## 2021-06-13 DIAGNOSIS — E1121 Type 2 diabetes mellitus with diabetic nephropathy: Secondary | ICD-10-CM | POA: Diagnosis not present

## 2021-06-20 DIAGNOSIS — Z89511 Acquired absence of right leg below knee: Secondary | ICD-10-CM | POA: Diagnosis not present

## 2021-06-20 DIAGNOSIS — Z89512 Acquired absence of left leg below knee: Secondary | ICD-10-CM | POA: Diagnosis not present

## 2021-06-20 DIAGNOSIS — Z4789 Encounter for other orthopedic aftercare: Secondary | ICD-10-CM | POA: Diagnosis not present

## 2021-06-21 ENCOUNTER — Other Ambulatory Visit: Payer: Self-pay | Admitting: Internal Medicine

## 2021-06-21 NOTE — Telephone Encounter (Signed)
Requested medication (s) are due for refill today: Yes  Requested medication (s) are on the active medication list: Yes  Last refill:  07/28/20  Future visit scheduled: Yes  Notes to clinic:  Historical provider.    Requested Prescriptions  Pending Prescriptions Disp Refills   ASPIRIN LOW DOSE 81 MG EC tablet [Pharmacy Med Name: ASPIRIN EC 81 MG TABLET] 30 tablet 1    Sig: TAKE 1 TABLET BY MOUTH EVERY DAY     Analgesics:  NSAIDS - aspirin Passed - 06/21/2021  9:33 AM      Passed - Patient is not pregnant      Passed - Valid encounter within last 12 months    Recent Outpatient Visits           1 week ago Mood disorder [facility])   [facility]an [facility], [facility]   3 months ago Annual physical exam   [facility]   7 months ago Environmental and seasonal allergies   [facility] Glean Hess, MD   10 months ago Cellulitis of toe of left foot   [facility], [facility]   11 months ago Hyperlipidemia associated with type 2 diabetes mellitus [facility])   [facility] Glean Hess, MD       Future Appointments             In 8 months [name] [name], MD [facility], [facility]

## 2021-06-23 DIAGNOSIS — Z6835 Body mass index (BMI) 35.0-35.9, adult: Secondary | ICD-10-CM | POA: Diagnosis not present

## 2021-06-23 DIAGNOSIS — M79622 Pain in left upper arm: Secondary | ICD-10-CM | POA: Diagnosis not present

## 2021-06-23 DIAGNOSIS — I440 Atrioventricular block, first degree: Secondary | ICD-10-CM | POA: Diagnosis not present

## 2021-06-23 DIAGNOSIS — I119 Hypertensive heart disease without heart failure: Secondary | ICD-10-CM | POA: Diagnosis not present

## 2021-06-23 DIAGNOSIS — F419 Anxiety disorder, unspecified: Secondary | ICD-10-CM | POA: Diagnosis not present

## 2021-06-23 DIAGNOSIS — D631 Anemia in chronic kidney disease: Secondary | ICD-10-CM | POA: Diagnosis not present

## 2021-06-23 DIAGNOSIS — Z794 Long term (current) use of insulin: Secondary | ICD-10-CM | POA: Diagnosis not present

## 2021-06-23 DIAGNOSIS — N186 End stage renal disease: Secondary | ICD-10-CM | POA: Diagnosis not present

## 2021-06-23 DIAGNOSIS — E1122 Type 2 diabetes mellitus with diabetic chronic kidney disease: Secondary | ICD-10-CM | POA: Diagnosis not present

## 2021-06-23 DIAGNOSIS — T82898A Other specified complication of vascular prosthetic devices, implants and grafts, initial encounter: Secondary | ICD-10-CM | POA: Diagnosis not present

## 2021-06-23 DIAGNOSIS — Y832 Surgical operation with anastomosis, bypass or graft as the cause of abnormal reaction of the patient, or of later complication, without mention of misadventure at the time of the procedure: Secondary | ICD-10-CM | POA: Diagnosis not present

## 2021-06-23 DIAGNOSIS — E669 Obesity, unspecified: Secondary | ICD-10-CM | POA: Diagnosis not present

## 2021-06-23 DIAGNOSIS — E1151 Type 2 diabetes mellitus with diabetic peripheral angiopathy without gangrene: Secondary | ICD-10-CM | POA: Diagnosis not present

## 2021-06-23 DIAGNOSIS — E1142 Type 2 diabetes mellitus with diabetic polyneuropathy: Secondary | ICD-10-CM | POA: Diagnosis not present

## 2021-06-23 DIAGNOSIS — G4733 Obstructive sleep apnea (adult) (pediatric): Secondary | ICD-10-CM | POA: Diagnosis not present

## 2021-06-23 DIAGNOSIS — I871 Compression of vein: Secondary | ICD-10-CM | POA: Diagnosis not present

## 2021-06-23 DIAGNOSIS — G8918 Other acute postprocedural pain: Secondary | ICD-10-CM | POA: Diagnosis not present

## 2021-06-23 DIAGNOSIS — Z882 Allergy status to sulfonamides status: Secondary | ICD-10-CM | POA: Diagnosis not present

## 2021-06-27 ENCOUNTER — Telehealth: Payer: Self-pay

## 2021-06-27 NOTE — Telephone Encounter (Signed)
Copied from [place] [id number]. Topic: General - Other >> Jun 27, 2021 11:28 AM Valere Dross wrote: Reason for CRM: Pt called in stating if pcp could write her a letter so she can [place] to [place], stating she is disabled, and uses a walker, and would need the trash men to be able to come up to her house to get her trash because she is unable to go outside and take it, it can be sent [email or web address] please advise.

## 2021-06-28 ENCOUNTER — Encounter: Payer: Self-pay | Admitting: Internal Medicine

## 2021-06-29 NOTE — Telephone Encounter (Signed)
Patient informed. Sent to email address listed.

## 2021-06-30 DIAGNOSIS — Z89511 Acquired absence of right leg below knee: Secondary | ICD-10-CM | POA: Diagnosis not present

## 2021-06-30 DIAGNOSIS — E11311 Type 2 diabetes mellitus with unspecified diabetic retinopathy with macular edema: Secondary | ICD-10-CM | POA: Insufficient documentation

## 2021-06-30 DIAGNOSIS — H25813 Combined forms of age-related cataract, bilateral: Secondary | ICD-10-CM | POA: Diagnosis not present

## 2021-06-30 DIAGNOSIS — E113293 Type 2 diabetes mellitus with mild nonproliferative diabetic retinopathy without macular edema, bilateral: Secondary | ICD-10-CM | POA: Diagnosis not present

## 2021-06-30 DIAGNOSIS — E113313 Type 2 diabetes mellitus with moderate nonproliferative diabetic retinopathy with macular edema, bilateral: Secondary | ICD-10-CM | POA: Diagnosis not present

## 2021-06-30 DIAGNOSIS — Z89512 Acquired absence of left leg below knee: Secondary | ICD-10-CM | POA: Diagnosis not present

## 2021-07-01 ENCOUNTER — Other Ambulatory Visit: Payer: Self-pay | Admitting: Internal Medicine

## 2021-07-01 DIAGNOSIS — F39 Unspecified mood [affective] disorder: Secondary | ICD-10-CM

## 2021-07-01 DIAGNOSIS — Z89511 Acquired absence of right leg below knee: Secondary | ICD-10-CM | POA: Diagnosis not present

## 2021-07-01 DIAGNOSIS — Z4781 Encounter for orthopedic aftercare following surgical amputation: Secondary | ICD-10-CM | POA: Diagnosis not present

## 2021-07-01 DIAGNOSIS — Z89512 Acquired absence of left leg below knee: Secondary | ICD-10-CM | POA: Diagnosis not present

## 2021-07-01 NOTE — Telephone Encounter (Signed)
Requested medication (s) are due for refill today: No  Requested medication (s) are on the active medication list: Yes  Last refill:  06/09/21 #30/1RF  Future visit scheduled: Yes  Notes to clinic:  requesting for 90 day supply, new medication that was started on 06/09/21     Requested Prescriptions  Pending Prescriptions Disp Refills   amitriptyline (ELAVIL) 25 MG tablet [Pharmacy Med Name: AMITRIPTYLINE HCL 25 MG TAB] 90 tablet 1    Sig: TAKE 1 TABLET BY MOUTH EVERYDAY AT BEDTIME     Psychiatry:  Antidepressants - Heterocyclics (TCAs) Passed - 07/01/2021  8:35 AM      Passed - Valid encounter within last 6 months    Recent Outpatient Visits           3 weeks ago Mood disorder [facility])   [facility], [facility]   4 months ago Annual physical exam   [facility] Glean Hess, MD   8 months ago Environmental and seasonal allergies   [facility] Glean Hess, MD   11 months ago Cellulitis of toe of left foot   [facility] Glean Hess, MD   1 year ago Hyperlipidemia associated with type 2 diabetes mellitus [facility])   [facility], [facility]       Future Appointments             In 8 months [name] [name], MD [facility], [facility]

## 2021-07-05 NOTE — Telephone Encounter (Signed)
Resent letter to e- mail through fax machine.

## 2021-07-05 NOTE — Telephone Encounter (Signed)
Pt states the city of [place] has not received her letter.  Can you resend to [email or web address]?  They need by the 28th.  Thanks.

## 2021-07-12 ENCOUNTER — Telehealth: Payer: Self-pay

## 2021-07-12 NOTE — Telephone Encounter (Signed)
Copied from [place] [phone]. Topic: General - Other >> Jul 12, 2021  8:46 AM Tessa Lerner A wrote: The patient would like their letter related to trash services, originally written on 06/28/21 emailed to [email or web address] [place]ans can also be reached by phone at [phone]  [employer] sanitation  Please contact further if needed

## 2021-07-12 NOTE — Telephone Encounter (Signed)
Spoke to Mrs. Edwards she gave me the fax number to send letter to. [phone]  KP

## 2021-07-14 ENCOUNTER — Other Ambulatory Visit: Payer: Self-pay | Admitting: Internal Medicine

## 2021-07-14 DIAGNOSIS — E11311 Type 2 diabetes mellitus with unspecified diabetic retinopathy with macular edema: Secondary | ICD-10-CM | POA: Diagnosis not present

## 2021-07-15 NOTE — Telephone Encounter (Signed)
Requested Prescriptions  Pending Prescriptions Disp Refills  . ASPIRIN LOW DOSE 81 MG EC tablet [Pharmacy Med Name: ASPIRIN EC 81 MG TABLET] 30 tablet 1    Sig: TAKE 1 TABLET BY MOUTH EVERY DAY     Analgesics:  NSAIDS - aspirin Passed - 07/14/2021  9:35 AM      Passed - Patient is not pregnant      Passed - Valid encounter within last 12 months    Recent Outpatient Visits          1 month ago Mood disorder [facility])   [facility], [facility]   4 months ago Annual physical exam   [facility], [facility]   8 months ago Environmental and seasonal allergies   [facility] Gle[facility] Hess, [facility]   11 months ago Cellulitis of toe of left foot   [facility] Glean Hess, MD   1 year ago Hyperlipidemia associated with type 2 diabetes mellitus [facility])   [facility] Glean Hess, MD      Future Appointments            In 7 months [name] [name], MD [facility], [facility]

## 2021-07-19 DIAGNOSIS — E1121 Type 2 diabetes mellitus with diabetic nephropathy: Secondary | ICD-10-CM | POA: Diagnosis not present

## 2021-07-19 DIAGNOSIS — D631 Anemia in chronic kidney disease: Secondary | ICD-10-CM | POA: Diagnosis not present

## 2021-07-19 DIAGNOSIS — I120 Hypertensive chronic kidney disease with stage 5 chronic kidney disease or end stage renal disease: Secondary | ICD-10-CM | POA: Diagnosis not present

## 2021-07-19 DIAGNOSIS — N185 Chronic kidney disease, stage 5: Secondary | ICD-10-CM | POA: Diagnosis not present

## 2021-07-20 DIAGNOSIS — L988 Other specified disorders of the skin and subcutaneous tissue: Secondary | ICD-10-CM | POA: Diagnosis not present

## 2021-07-20 DIAGNOSIS — R238 Other skin changes: Secondary | ICD-10-CM | POA: Diagnosis not present

## 2021-07-22 DIAGNOSIS — E1121 Type 2 diabetes mellitus with diabetic nephropathy: Secondary | ICD-10-CM | POA: Diagnosis not present

## 2021-07-22 DIAGNOSIS — N186 End stage renal disease: Secondary | ICD-10-CM | POA: Diagnosis not present

## 2021-07-22 DIAGNOSIS — Z794 Long term (current) use of insulin: Secondary | ICD-10-CM | POA: Diagnosis not present

## 2021-07-22 DIAGNOSIS — I10 Essential (primary) hypertension: Secondary | ICD-10-CM | POA: Diagnosis not present

## 2021-07-22 DIAGNOSIS — N184 Chronic kidney disease, stage 4 (severe): Secondary | ICD-10-CM | POA: Diagnosis not present

## 2021-07-25 DIAGNOSIS — Z89511 Acquired absence of right leg below knee: Secondary | ICD-10-CM | POA: Diagnosis not present

## 2021-07-25 DIAGNOSIS — Z89512 Acquired absence of left leg below knee: Secondary | ICD-10-CM | POA: Diagnosis not present

## 2021-07-25 DIAGNOSIS — Z4789 Encounter for other orthopedic aftercare: Secondary | ICD-10-CM | POA: Diagnosis not present

## 2021-07-26 DIAGNOSIS — N185 Chronic kidney disease, stage 5: Secondary | ICD-10-CM | POA: Diagnosis not present

## 2021-07-26 DIAGNOSIS — E1121 Type 2 diabetes mellitus with diabetic nephropathy: Secondary | ICD-10-CM | POA: Diagnosis not present

## 2021-07-26 DIAGNOSIS — R809 Proteinuria, unspecified: Secondary | ICD-10-CM | POA: Diagnosis not present

## 2021-07-26 DIAGNOSIS — D631 Anemia in chronic kidney disease: Secondary | ICD-10-CM | POA: Diagnosis not present

## 2021-08-01 DIAGNOSIS — Z89512 Acquired absence of left leg below knee: Secondary | ICD-10-CM | POA: Diagnosis not present

## 2021-08-01 DIAGNOSIS — Z89511 Acquired absence of right leg below knee: Secondary | ICD-10-CM | POA: Diagnosis not present

## 2021-08-01 DIAGNOSIS — Z4789 Encounter for other orthopedic aftercare: Secondary | ICD-10-CM | POA: Diagnosis not present

## 2021-08-10 ENCOUNTER — Other Ambulatory Visit: Payer: Self-pay | Admitting: Internal Medicine

## 2021-08-10 NOTE — Telephone Encounter (Signed)
Requested Prescriptions  Pending Prescriptions Disp Refills   ASPIRIN LOW DOSE 81 MG EC tablet [Pharmacy Med Name: ASPIRIN EC 81 MG TABLET] 30 tablet 1    Sig: TAKE 1 TABLET BY MOUTH EVERY DAY     Analgesics:  NSAIDS - aspirin Passed - 08/10/2021  8:34 AM      Passed - Patient is not pregnant      Passed - Valid encounter within last 12 months    Recent Outpatient Visits          2 months ago Mood disorder [facility])   [facility] Gle[facility], [facility]   5 months ago Annual physical exam   [facility] Glean Hess, MD   9 months ago Environmental and seasonal allergies   [facility] Glean Hess, MD   1 year ago Cellulitis of toe of left foot   [facility] Glean Hess, MD   1 year ago Hyperlipidemia associated with type 2 diabetes mellitus [facility])   [facility] Glean Hess, MD      Future Appointments            In 6 months [name] [name], MD [facility], [facility]

## 2021-08-12 DIAGNOSIS — E11311 Type 2 diabetes mellitus with unspecified diabetic retinopathy with macular edema: Secondary | ICD-10-CM | POA: Diagnosis not present

## 2021-08-20 ENCOUNTER — Other Ambulatory Visit: Payer: Self-pay | Admitting: Internal Medicine

## 2021-08-20 DIAGNOSIS — J9801 Acute bronchospasm: Secondary | ICD-10-CM

## 2021-08-20 NOTE — Telephone Encounter (Signed)
Requested Prescriptions  Pending Prescriptions Disp Refills   albuterol (VENTOLIN HFA) 108 (90 Base) MCG/ACT inhaler [Pharmacy Med Name: ALBUTEROL HFA (VENTOLIN) INH] 18 each 0    Sig: TAKE 2 PUFFS BY MOUTH EVERY 6 HOURS AS NEEDED FOR WHEEZE OR SHORTNESS OF BREATH     Pulmonology:  Beta Agonists Failed - 08/20/2021 12:44 AM      Failed - One inhaler should last at least one month. If the patient is requesting refills earlier, contact the patient to check for uncontrolled symptoms.      Passed - Valid encounter within last 12 months    Recent Outpatient Visits          2 months ago Mood disorder [facility])   [facility] Gle[facility], [facility]   5 months ago Annual physical exam   [facility] Glean Hess, MD   9 months ago Environmental and seasonal allergies   [facility] Glean Hess, MD   1 year ago Cellulitis of toe of left foot   [facility] Glean [facility], MD   1 year ago Hyperlipidemia associated with type 2 diabetes mellitus [facility])   [facility] Glean Hess, MD      Future Appointments            In 6 months [name] [name], MD [facility], [facility]

## 2021-08-22 DIAGNOSIS — Z4789 Encounter for other orthopedic aftercare: Secondary | ICD-10-CM | POA: Diagnosis not present

## 2021-08-22 DIAGNOSIS — Z89511 Acquired absence of right leg below knee: Secondary | ICD-10-CM | POA: Diagnosis not present

## 2021-08-22 DIAGNOSIS — Z89512 Acquired absence of left leg below knee: Secondary | ICD-10-CM | POA: Diagnosis not present

## 2021-08-30 DIAGNOSIS — Z794 Long term (current) use of insulin: Secondary | ICD-10-CM | POA: Diagnosis not present

## 2021-08-30 DIAGNOSIS — I152 Hypertension secondary to endocrine disorders: Secondary | ICD-10-CM | POA: Diagnosis not present

## 2021-08-30 DIAGNOSIS — E1159 Type 2 diabetes mellitus with other circulatory complications: Secondary | ICD-10-CM | POA: Diagnosis not present

## 2021-08-30 DIAGNOSIS — N185 Chronic kidney disease, stage 5: Secondary | ICD-10-CM | POA: Diagnosis not present

## 2021-08-30 DIAGNOSIS — E785 Hyperlipidemia, unspecified: Secondary | ICD-10-CM | POA: Diagnosis not present

## 2021-08-30 DIAGNOSIS — E1169 Type 2 diabetes mellitus with other specified complication: Secondary | ICD-10-CM | POA: Diagnosis not present

## 2021-08-30 DIAGNOSIS — E1122 Type 2 diabetes mellitus with diabetic chronic kidney disease: Secondary | ICD-10-CM | POA: Diagnosis not present

## 2021-08-30 LAB — HEMOGLOBIN A1C: Hemoglobin A1C: 6

## 2021-08-31 DIAGNOSIS — I10 Essential (primary) hypertension: Secondary | ICD-10-CM | POA: Diagnosis not present

## 2021-08-31 DIAGNOSIS — N184 Chronic kidney disease, stage 4 (severe): Secondary | ICD-10-CM | POA: Diagnosis not present

## 2021-08-31 DIAGNOSIS — E113313 Type 2 diabetes mellitus with moderate nonproliferative diabetic retinopathy with macular edema, bilateral: Secondary | ICD-10-CM | POA: Diagnosis not present

## 2021-08-31 DIAGNOSIS — Z89511 Acquired absence of right leg below knee: Secondary | ICD-10-CM | POA: Diagnosis not present

## 2021-09-05 DIAGNOSIS — Z4789 Encounter for other orthopedic aftercare: Secondary | ICD-10-CM | POA: Diagnosis not present

## 2021-09-05 DIAGNOSIS — Z89512 Acquired absence of left leg below knee: Secondary | ICD-10-CM | POA: Diagnosis not present

## 2021-09-05 DIAGNOSIS — Z89511 Acquired absence of right leg below knee: Secondary | ICD-10-CM | POA: Diagnosis not present

## 2021-09-06 DIAGNOSIS — Z794 Long term (current) use of insulin: Secondary | ICD-10-CM | POA: Diagnosis not present

## 2021-09-06 DIAGNOSIS — G4733 Obstructive sleep apnea (adult) (pediatric): Secondary | ICD-10-CM | POA: Diagnosis not present

## 2021-09-06 DIAGNOSIS — E118 Type 2 diabetes mellitus with unspecified complications: Secondary | ICD-10-CM | POA: Diagnosis not present

## 2021-09-06 DIAGNOSIS — I10 Essential (primary) hypertension: Secondary | ICD-10-CM | POA: Diagnosis not present

## 2021-09-06 DIAGNOSIS — D631 Anemia in chronic kidney disease: Secondary | ICD-10-CM | POA: Diagnosis not present

## 2021-09-06 DIAGNOSIS — N186 End stage renal disease: Secondary | ICD-10-CM | POA: Diagnosis not present

## 2021-09-06 DIAGNOSIS — N189 Chronic kidney disease, unspecified: Secondary | ICD-10-CM | POA: Diagnosis not present

## 2021-09-06 DIAGNOSIS — Z89511 Acquired absence of right leg below knee: Secondary | ICD-10-CM | POA: Diagnosis not present

## 2021-09-06 DIAGNOSIS — Z01818 Encounter for other preprocedural examination: Secondary | ICD-10-CM | POA: Diagnosis not present

## 2021-09-06 DIAGNOSIS — I739 Peripheral vascular disease, unspecified: Secondary | ICD-10-CM | POA: Diagnosis not present

## 2021-09-06 DIAGNOSIS — N2581 Secondary hyperparathyroidism of renal origin: Secondary | ICD-10-CM | POA: Diagnosis not present

## 2021-09-06 DIAGNOSIS — E1121 Type 2 diabetes mellitus with diabetic nephropathy: Secondary | ICD-10-CM | POA: Diagnosis not present

## 2021-09-12 ENCOUNTER — Telehealth: Payer: Federal, State, Local not specified - PPO | Admitting: Internal Medicine

## 2021-09-13 ENCOUNTER — Other Ambulatory Visit: Payer: Self-pay | Admitting: Internal Medicine

## 2021-09-13 DIAGNOSIS — J9801 Acute bronchospasm: Secondary | ICD-10-CM

## 2021-09-13 NOTE — Telephone Encounter (Signed)
Requested Prescriptions  Pending Prescriptions Disp Refills   albuterol (VENTOLIN HFA) 108 (90 Base) MCG/ACT inhaler [Pharmacy Med Name: ALBUTEROL HFA (VENTOLIN) INH] 18 each 0    Sig: TAKE 2 PUFFS BY MOUTH EVERY 6 HOURS AS NEEDED FOR WHEEZE OR SHORTNESS OF BREATH     Pulmonology:  Beta Agonists Failed - 09/13/2021  1:35 AM      Failed - One inhaler should last at least one month. If the patient is requesting refills earlier, contact the patient to check for uncontrolled symptoms.      Passed - Valid encounter within last 12 months    Recent Outpatient Visits          3 months ago Mood disorder [facility])   [facility] Gle[facility] Hess, [facility]   6 months ago Annual physical exam   [facility] Glean Hess, MD   10 months ago Environmental and seasonal allergies   [facility] Glean Hess, [facility]   1 year ago Cellulitis of toe of left foot   [facility] Glean Hess, MD   1 year ago Hyperlipidemia associated with type 2 diabetes mellitus [facility])   [facility] Glean [facility], MD      Future Appointments            Tomorrow Glean Hess, MD [facility], [place]   In 5 months [name] [name], MD [facility], [facility]

## 2021-09-14 ENCOUNTER — Encounter: Payer: Self-pay | Admitting: Internal Medicine

## 2021-09-14 ENCOUNTER — Other Ambulatory Visit: Payer: Self-pay

## 2021-09-14 ENCOUNTER — Ambulatory Visit: Payer: Medicare Other | Admitting: Internal Medicine

## 2021-09-14 VITALS — BP 138/82 | HR 70 | Temp 98.2°F | Ht 69.0 in | Wt 253.8 lb

## 2021-09-14 DIAGNOSIS — J0100 Acute maxillary sinusitis, unspecified: Secondary | ICD-10-CM

## 2021-09-14 MED ORDER — AMOXICILLIN-POT CLAVULANATE 500-125 MG PO TABS: 1.0000 | ORAL_TABLET | Freq: Two times a day (BID) | ORAL | 0 refills | Status: DC

## 2021-09-14 MED ORDER — LEVOFLOXACIN 250 MG PO TABS: 250.0000 mg | ORAL_TABLET | ORAL | 0 refills | Status: AC

## 2021-09-14 NOTE — Progress Notes (Signed)
Date:  09/14/2021   Name:  Cindy Osborne   DOB:  Mar 28, 1966   MRN:  [id number]   Chief Complaint: Nasal Congestion (Symptoms of fatigue, runny nose, sneezing x1 week; has not taken home Covid test; afebrile, no body aches)  Sinus Problem This is a new problem. The current episode started in the past 7 days. The problem is unchanged. There has been no fever. The pain is mild. Associated symptoms include congestion, sinus pressure and sneezing. Pertinent negatives include no chills, coughing, headaches, shortness of breath or sore throat. Past treatments include spray decongestants. The treatment provided mild relief.   Lab Results  Component Value Date   NA 136 (A) 02/02/2021   K 4.3 02/02/2021   CO2 22 02/02/2021   GLUCOSE 73 06/30/2020   BUN 112 (A) 02/02/2021   CREATININE 9.8 (A) 02/02/2021   CALCIUM 10.4 02/02/2021   GFRNONAA 5 (L) 06/30/2020   Lab Results  Component Value Date   CHOL 168 03/03/2021   HDL 35 (L) 03/03/2021   LDLCALC 113 (H) 03/03/2021   TRIG 107 03/03/2021   CHOLHDL 4.8 (H) 03/03/2021   Lab Results  Component Value Date   TSH 3.120 03/03/2021   Lab Results  Component Value Date   HGBA1C 6.3 12/30/2020   Lab Results  Component Value Date   WBC 8.0 02/02/2021   HGB 10.2 (A) 02/02/2021   HCT 30 (A) 02/02/2021   MCV 91 02/26/2020   PLT 227 02/02/2021   Lab Results  Component Value Date   ALT 14 02/02/2021   AST 16 02/02/2021   ALKPHOS 80 02/02/2021   BILITOT 0.2 06/30/2020   No results found for: 25OHVITD2, 25OHVITD3, VD25OH   Review of Systems  Constitutional:  Negative for chills, fatigue, fever and unexpected weight change.  HENT:  Positive for congestion, postnasal drip, sinus pressure and sneezing. Negative for nosebleeds and sore throat.   Eyes:  Negative for visual disturbance.  Respiratory:  Negative for cough, choking, chest tightness, shortness of breath and wheezing.   Cardiovascular:  Negative for chest pain and palpitations.   Gastrointestinal:  Negative for abdominal pain, constipation and diarrhea.  Neurological:  Negative for dizziness, weakness, light-headedness and headaches.   Patient Active Problem List   Diagnosis Date Noted   Combined forms of age-related cataract of both eyes 06/30/2021   Diabetic macular edema ([place]) 06/30/2021   Proteinuria 06/13/2021   Benign hypertensive kidney disease with chronic kidney disease 67/89/3810   Metabolic acidosis 17/51/0258   Encounter for insertion of mirena IUD 03/15/2021   Panic disorder 03/03/2021   Acquired absence of left leg below knee ([place]) 10/26/2020   PVD (peripheral vascular disease) ([place]) 07/28/2020   Uncontrolled type 2 diabetes mellitus with diabetic peripheral angiopathy without gangrene 02/26/2020   Acquired absence of right leg below knee ([place]) 07/28/2019   Diabetic retinopathy ([place]) 11/23/2016   Slow transit constipation 09/24/2015   Environmental and seasonal allergies 07/09/2015   Anemia associated with chronic renal failure 07/09/2015   CKD stage 5 due to type 2 diabetes mellitus ([place]) 07/09/2015   Type II diabetes mellitus with complication ([place]) 52/77/8242   Herpes simplex infection 07/09/2015   Hyperlipidemia associated with type 2 diabetes mellitus ([place]) 07/09/2015   Heart & renal disease, hypertensive, with heart failure ([place]) 07/09/2015   Arthritis of knee, degenerative 07/09/2015   Tendinitis 07/09/2015   Avitaminosis D 07/09/2015   Obstructive apnea 12/15/2014   Intracervical pessary 01/07/2014   Essential hypertension 09/11/2013  Type 2 diabetes mellitus with diabetic nephropathy (HCC) 09/11/2013    Allergies  Allergen Reactions   Atorvastatin Other (See Comments)    Back pain   Pioglitazone Other (See Comments)    Edema   Peanut-Containing Drug Products Itching   Rosuvastatin Itching   Shellfish-Derived Products Rash    shrimp   Sulfa Antibiotics Rash    Past Surgical History:  Procedure Laterality Date    angioplasty politeal Left 07/2020   avg for dialysis     CHOLECYSTECTOMY     COLONOSCOPY  08/15/2011   cleared for 10 yrs   LEG AMPUTATION BELOW KNEE Right 04/2019   LEG AMPUTATION BELOW KNEE Left 09/08/2020   TOE AMPUTATION Right 10/15/2018    Social History   Tobacco Use   Smoking status: Never   Smokeless tobacco: Never  Vaping Use   Vaping Use: Never used  Substance Use Topics   Alcohol use: Yes   Drug use: Never     Medication list has been reviewed and updated.  Current Meds  Medication Sig   Acetaminophen (TYLENOL PO) Take 1,000 mg by mouth daily as needed.   albuterol (VENTOLIN HFA) 108 (90 Base) MCG/ACT inhaler TAKE 2 PUFFS BY MOUTH EVERY 6 HOURS AS NEEDED FOR WHEEZE OR SHORTNESS OF BREATH   amitriptyline (ELAVIL) 25 MG tablet Take 25 mg by mouth at bedtime.   amLODipine (NORVASC) 10 MG tablet Take 1 tablet by mouth daily.   ASPIRIN LOW DOSE 81 MG EC tablet TAKE 1 TABLET BY MOUTH EVERY DAY   fexofenadine (ALLEGRA) 180 MG tablet Take 180 mg by mouth daily.   fluticasone (FLONASE) 50 MCG/ACT nasal spray Place 2 sprays into both nostrils daily.   furosemide (LASIX) 80 MG tablet Take 80 mg by mouth every morning.   insulin aspart (NOVOLOG) 100 UNIT/ML FlexPen Inject 5 Units into the skin 3 (three) times daily before meals.   Insulin Glargine (BASAGLAR KWIKPEN [place]) Inject 18 Units into the skin daily.   levonorgestrel (MIRENA) 20 MCG/24HR IUD by Intrauterine route.   metoprolol (TOPROL-XL) 200 MG 24 hr tablet Take 200 mg by mouth daily.   montelukast (SINGULAIR) 10 MG tablet Take 1 tablet (10 mg total) by mouth at bedtime. (Patient taking differently: Take 10 mg by mouth as needed.)   mupirocin ointment (BACTROBAN) 2 % mupirocin 2 % topical ointment   pravastatin (PRAVACHOL) 20 MG tablet TAKE 1 TABLET BY MOUTH EVERY DAY   sennosides-docusate sodium (SENOKOT-S) 8.6-50 MG tablet Take 1 tablet by mouth as needed for constipation.   sodium bicarbonate 650 MG tablet Take by  mouth 3 (three) times daily.   sucroferric oxyhydroxide (VELPHORO) 500 MG chewable tablet Chew 500 mg by mouth in the morning and at bedtime.   triamcinolone cream (KENALOG) 0.1 % Apply 1 application topically 2 (two) times daily. To rash on leg   [DISCONTINUED] amitriptyline (ELAVIL) 25 MG tablet TAKE 1 TABLET BY MOUTH EVERYDAY AT BEDTIME    PHQ 2/9 Scores 06/09/2021 03/03/2021 10/26/2020 07/28/2020  PHQ - 2 Score 0 0 0 0  PHQ- 9 Score 3 0 0 0    GAD 7 : Generalized Anxiety Score 06/09/2021 03/03/2021 10/26/2020 07/28/2020  Nervous, Anxious, on Edge 3 0 1 0  Control/stop worrying 0 0 2 0  Worry too much - different things 1 0 2 0  Trouble relaxing 3 1 0 0  Restless 3 0 0 0  Easily annoyed or irritable 3 1 0 0  Afraid - awful might happen  0 0 0 0  Total GAD 7 Score 13 2 5  0  Anxiety Difficulty Not difficult at all Not difficult at all Not difficult at all -    BP Readings from Last 3 Encounters:  09/14/21 138/82  06/09/21 126/72  03/03/21 116/68    Physical Exam Constitutional:      Appearance: Normal appearance. She is well-developed.  HENT:     Right Ear: Ear canal and external ear normal. Tympanic membrane is not erythematous or retracted.     Left Ear: Ear canal and external ear normal. Tympanic membrane is not erythematous or retracted.     Nose:     Right Sinus: Maxillary sinus tenderness present. No frontal sinus tenderness.     Left Sinus: Maxillary sinus tenderness present. No frontal sinus tenderness.     Mouth/Throat:     Pharynx: Uvula midline.  Cardiovascular:     Rate and Rhythm: Normal rate and regular rhythm.     Heart sounds: Normal heart sounds.  Pulmonary:     Effort: Pulmonary effort is normal.     Breath sounds: Normal breath sounds. No wheezing or rales.  Musculoskeletal:     Cervical back: Normal range of motion.  Lymphadenopathy:     Cervical: No cervical adenopathy.  Neurological:     Mental Status: She is alert and oriented to person, place,  and time.    Wt Readings from Last 3 Encounters:  09/14/21 253 lb 12.8 oz (115.1 kg)  06/09/21 249 lb 8 oz (113.2 kg)  03/03/21 251 lb 8 oz (114.1 kg)    BP 138/82 (BP Location: Right Arm, Patient Position: Sitting, Cuff Size: Large)    Pulse 70    Temp 98.2 F (36.8 C) (Oral)    Ht 5\' 9"  (1.753 m)    Wt 253 lb 12.8 oz (115.1 kg) Comment: patient wears prosthetic   SpO2 98%    BMI 37.48 kg/m   Assessment and Plan: 1. Acute non-recurrent maxillary sinusitis Continue Flonase spray. Treat with dose adjusted Levofloxacin for 10 days. - levofloxacin (LEVAQUIN) 250 MG tablet; Take 1 tablet (250 mg total) by mouth every other day for 10 days.  Dispense: 5 tablet; Refill: 0   Partially dictated using Editor, commissioning. Any errors are unintentional.  Halina Maidens, MD [facility]  09/14/2021

## 2021-09-16 DIAGNOSIS — Z89511 Acquired absence of right leg below knee: Secondary | ICD-10-CM | POA: Diagnosis not present

## 2021-09-16 DIAGNOSIS — N186 End stage renal disease: Secondary | ICD-10-CM | POA: Diagnosis not present

## 2021-09-16 DIAGNOSIS — Z7982 Long term (current) use of aspirin: Secondary | ICD-10-CM | POA: Diagnosis not present

## 2021-09-16 DIAGNOSIS — Z882 Allergy status to sulfonamides status: Secondary | ICD-10-CM | POA: Diagnosis not present

## 2021-09-16 DIAGNOSIS — Z888 Allergy status to other drugs, medicaments and biological substances status: Secondary | ICD-10-CM | POA: Diagnosis not present

## 2021-09-16 DIAGNOSIS — Y832 Surgical operation with anastomosis, bypass or graft as the cause of abnormal reaction of the patient, or of later complication, without mention of misadventure at the time of the procedure: Secondary | ICD-10-CM | POA: Diagnosis not present

## 2021-09-16 DIAGNOSIS — E1122 Type 2 diabetes mellitus with diabetic chronic kidney disease: Secondary | ICD-10-CM | POA: Diagnosis not present

## 2021-09-16 DIAGNOSIS — Z89432 Acquired absence of left foot: Secondary | ICD-10-CM | POA: Diagnosis not present

## 2021-09-16 DIAGNOSIS — G8918 Other acute postprocedural pain: Secondary | ICD-10-CM | POA: Diagnosis not present

## 2021-09-16 DIAGNOSIS — Z794 Long term (current) use of insulin: Secondary | ICD-10-CM | POA: Diagnosis not present

## 2021-09-16 DIAGNOSIS — I120 Hypertensive chronic kidney disease with stage 5 chronic kidney disease or end stage renal disease: Secondary | ICD-10-CM | POA: Diagnosis not present

## 2021-09-16 DIAGNOSIS — Z79899 Other long term (current) drug therapy: Secondary | ICD-10-CM | POA: Diagnosis not present

## 2021-09-16 DIAGNOSIS — T82590A Other mechanical complication of surgically created arteriovenous fistula, initial encounter: Secondary | ICD-10-CM | POA: Diagnosis not present

## 2021-09-16 DIAGNOSIS — M79622 Pain in left upper arm: Secondary | ICD-10-CM | POA: Diagnosis not present

## 2021-09-17 DIAGNOSIS — Z79899 Other long term (current) drug therapy: Secondary | ICD-10-CM | POA: Diagnosis not present

## 2021-09-17 DIAGNOSIS — Z888 Allergy status to other drugs, medicaments and biological substances status: Secondary | ICD-10-CM | POA: Diagnosis not present

## 2021-09-17 DIAGNOSIS — Z794 Long term (current) use of insulin: Secondary | ICD-10-CM | POA: Diagnosis not present

## 2021-09-17 DIAGNOSIS — I120 Hypertensive chronic kidney disease with stage 5 chronic kidney disease or end stage renal disease: Secondary | ICD-10-CM | POA: Diagnosis not present

## 2021-09-17 DIAGNOSIS — T82590A Other mechanical complication of surgically created arteriovenous fistula, initial encounter: Secondary | ICD-10-CM | POA: Diagnosis not present

## 2021-09-17 DIAGNOSIS — Z89432 Acquired absence of left foot: Secondary | ICD-10-CM | POA: Diagnosis not present

## 2021-09-17 DIAGNOSIS — Z89511 Acquired absence of right leg below knee: Secondary | ICD-10-CM | POA: Diagnosis not present

## 2021-09-17 DIAGNOSIS — E1122 Type 2 diabetes mellitus with diabetic chronic kidney disease: Secondary | ICD-10-CM | POA: Diagnosis not present

## 2021-09-17 DIAGNOSIS — N186 End stage renal disease: Secondary | ICD-10-CM | POA: Diagnosis not present

## 2021-09-17 DIAGNOSIS — Z7982 Long term (current) use of aspirin: Secondary | ICD-10-CM | POA: Diagnosis not present

## 2021-09-17 DIAGNOSIS — Z882 Allergy status to sulfonamides status: Secondary | ICD-10-CM | POA: Diagnosis not present

## 2021-09-17 DIAGNOSIS — Y832 Surgical operation with anastomosis, bypass or graft as the cause of abnormal reaction of the patient, or of later complication, without mention of misadventure at the time of the procedure: Secondary | ICD-10-CM | POA: Diagnosis not present

## 2021-10-04 DIAGNOSIS — E1122 Type 2 diabetes mellitus with diabetic chronic kidney disease: Secondary | ICD-10-CM | POA: Diagnosis not present

## 2021-10-04 DIAGNOSIS — I770 Arteriovenous fistula, acquired: Secondary | ICD-10-CM | POA: Diagnosis not present

## 2021-10-04 DIAGNOSIS — Z6841 Body Mass Index (BMI) 40.0 and over, adult: Secondary | ICD-10-CM | POA: Diagnosis not present

## 2021-10-04 DIAGNOSIS — I120 Hypertensive chronic kidney disease with stage 5 chronic kidney disease or end stage renal disease: Secondary | ICD-10-CM | POA: Diagnosis not present

## 2021-10-04 DIAGNOSIS — N184 Chronic kidney disease, stage 4 (severe): Secondary | ICD-10-CM | POA: Diagnosis not present

## 2021-10-04 DIAGNOSIS — Z89511 Acquired absence of right leg below knee: Secondary | ICD-10-CM | POA: Diagnosis not present

## 2021-10-04 DIAGNOSIS — N186 End stage renal disease: Secondary | ICD-10-CM | POA: Diagnosis not present

## 2021-10-06 ENCOUNTER — Other Ambulatory Visit: Payer: Self-pay

## 2021-10-06 ENCOUNTER — Other Ambulatory Visit: Payer: Self-pay | Admitting: Internal Medicine

## 2021-10-06 DIAGNOSIS — J9801 Acute bronchospasm: Secondary | ICD-10-CM

## 2021-10-06 NOTE — Telephone Encounter (Signed)
Requested medication (s) are due for refill today - unsure  Requested medication (s) are on the active medication list -yes  Future visit scheduled -yes  Last refill: 09/13/21  Notes to clinic: Request RF: Call to patient- less than 1 month RF- left message to call office- not sure if this Rx was for acute visit- or chronic use- sent for review   Requested Prescriptions  Pending Prescriptions Disp Refills   albuterol (VENTOLIN HFA) 108 (90 Base) MCG/ACT inhaler [Pharmacy Med Name: ALBUTEROL HFA (VENTOLIN) INH] 18 each 0    Sig: TAKE 2 PUFFS BY MOUTH EVERY 6 HOURS AS NEEDED FOR WHEEZE OR SHORTNESS OF BREATH     Pulmonology:  Beta Agonists 2 Passed - 10/06/2021  1:46 AM      Passed - Last BP in normal range    BP Readings from Last 1 Encounters:  09/14/21 138/82          Passed - Last Heart Rate in normal range    Pulse Readings from Last 1 Encounters:  09/14/21 70          Passed - Valid encounter within last 12 months    Recent Outpatient Visits           3 weeks ago Acute non-recurrent maxillary sinusitis   [facility], [facility]   3 months ago Mood disorder [facility])   [facility] Glean [facility]   7 months ago Annual physical exam   [facility] Glean Hess, MD   11 months ago Environmental and seasonal allergies   [facility] Gle[facility], [facility]   1 year ago Cellulitis of toe of left foot   [facility] Glean Hess, MD       Future Appointments             In 5 months [name] [name], MD [facility], [facility]               Requested Prescriptions  Pending Prescriptions Disp Refills   albuterol (VENTOLIN HFA) 108 (90 Base) MCG/ACT inhaler [Pharmacy Med Name: ALBUTEROL HFA (VENTOLIN) INH] 18 each 0    Sig: TAKE 2 PUFFS BY MOUTH EVERY 6 HOURS AS NEEDED FOR WHEEZE OR SHORTNESS OF BREATH     Pulmonology:  Beta Agonists 2 Passed - 10/06/2021  1:46 AM      Passed - Last BP in  normal range    BP Readings from Last 1 Encounters:  09/14/21 138/82          Passed - Last Heart Rate in normal range    Pulse Readings from Last 1 Encounters:  09/14/21 70          Passed - Valid encounter within last 12 months    Recent Outpatient Visits           3 weeks ago Acute non-recurrent maxillary sinusitis   [facility] Glean [facility]   3 months ago Mood disorder [facility])   [facility] Glean [facility], MD   7 months ago Annual physical exam   [facility], [facility]   11 months ago Environmental and seasonal allergies   [facility] Glean Hess, MD   1 year ago Cellulitis of toe of left foot   [facility] Glean Hess, MD       Future Appointments  In 5 months [name], [name], MD [place]

## 2021-10-07 DIAGNOSIS — Z89512 Acquired absence of left leg below knee: Secondary | ICD-10-CM | POA: Diagnosis not present

## 2021-10-13 DIAGNOSIS — E1121 Type 2 diabetes mellitus with diabetic nephropathy: Secondary | ICD-10-CM | POA: Diagnosis not present

## 2021-10-13 DIAGNOSIS — N185 Chronic kidney disease, stage 5: Secondary | ICD-10-CM | POA: Diagnosis not present

## 2021-10-13 DIAGNOSIS — D631 Anemia in chronic kidney disease: Secondary | ICD-10-CM | POA: Diagnosis not present

## 2021-10-13 DIAGNOSIS — R809 Proteinuria, unspecified: Secondary | ICD-10-CM | POA: Diagnosis not present

## 2021-10-19 DIAGNOSIS — D631 Anemia in chronic kidney disease: Secondary | ICD-10-CM | POA: Diagnosis not present

## 2021-10-19 DIAGNOSIS — R809 Proteinuria, unspecified: Secondary | ICD-10-CM | POA: Diagnosis not present

## 2021-10-19 DIAGNOSIS — E1121 Type 2 diabetes mellitus with diabetic nephropathy: Secondary | ICD-10-CM | POA: Diagnosis not present

## 2021-10-19 DIAGNOSIS — N185 Chronic kidney disease, stage 5: Secondary | ICD-10-CM | POA: Diagnosis not present

## 2021-10-25 ENCOUNTER — Other Ambulatory Visit: Payer: Self-pay | Admitting: Internal Medicine

## 2021-10-25 DIAGNOSIS — J3089 Other allergic rhinitis: Secondary | ICD-10-CM

## 2021-10-25 NOTE — Telephone Encounter (Signed)
Requested medication (s) are due for refill today: expired medication ? ?Requested medication (s) are on the active medication list: yes  ? ?Last refill:  11/19/19   #90 3 refills ? ?Future visit scheduled: yes in 4 months  ? ?Notes to clinic:  expired medication. Do you want to renew Rx? ? ? ?  ?Requested Prescriptions  ?Pending Prescriptions Disp Refills  ? montelukast (SINGULAIR) 10 MG tablet [Pharmacy Med Name: MONTELUKAST SOD 10 MG TABLET] 90 tablet 3  ?  Sig: TAKE 1 TABLET BY MOUTH EVERYDAY AT BEDTIME  ?  ? Pulmonology:  Leukotriene Inhibitors Passed - 10/25/2021 12:36 PM  ?  ?  Passed - Valid encounter within last 12 months  ?  Recent Outpatient Visits   ? ?      ? 1 month ago Acute non-recurrent maxillary sinusitis  ? [facility] Glean Hess, MD  ? 4 months ago Mood disorder [facility])  ? [facility] Glean Hess, [facility]  ? 7 months ago Annual physical exam  ? [facility] Glean Hess, MD  ? 12 months ago Environmental and seasonal allergies  ? [facility] Gle[facility], [facility]  ? 1 year ago Cellulitis of toe of left foot  ? [facility] Glean Hess, MD  ? ?  ?  ?Future Appointments   ? ?        ? In 4 months [name] [name], MD [facility], [place]  ? ?  ? ?  ?  ?  ? ?

## 2021-10-26 DIAGNOSIS — Y832 Surgical operation with anastomosis, bypass or graft as the cause of abnormal reaction of the patient, or of later complication, without mention of misadventure at the time of the procedure: Secondary | ICD-10-CM | POA: Diagnosis not present

## 2021-10-26 DIAGNOSIS — Z992 Dependence on renal dialysis: Secondary | ICD-10-CM | POA: Diagnosis not present

## 2021-10-26 DIAGNOSIS — T82858A Stenosis of vascular prosthetic devices, implants and grafts, initial encounter: Secondary | ICD-10-CM | POA: Diagnosis not present

## 2021-10-26 DIAGNOSIS — N186 End stage renal disease: Secondary | ICD-10-CM | POA: Diagnosis not present

## 2021-10-27 ENCOUNTER — Other Ambulatory Visit: Payer: Self-pay | Admitting: Internal Medicine

## 2021-10-27 NOTE — Telephone Encounter (Signed)
Requested medication (s) are due for refill today: yes ? ?Requested medication (s) are on the active medication list: yes ? ?Last refill:  07/15/21 #30/1 ? ?Future visit scheduled: yes ? ?Notes to clinic:  Unable to refill per protocol due to failed labs, no updated results. ? ? ? ?  ?Requested Prescriptions  ?Pending Prescriptions Disp Refills  ? ASPIRIN LOW DOSE 81 MG EC tablet [Pharmacy Med Name: ASPIRIN EC 81 MG TABLET] 30 tablet 1  ?  Sig: TAKE 1 TABLET BY MOUTH EVERY DAY  ?  ? Analgesics:  NSAIDS - aspirin Failed - 10/27/2021  2:35 PM  ?  ?  Failed - Cr in normal range and within 360 days  ?  Creatinine  ?Date Value Ref Range Status  ?02/02/2021 9.8 (A) 0.5 - 1.1 Final  ? ?Creatinine, Ser  ?Date Value Ref Range Status  ?06/30/2020 7.63 (H) 0.57 - 1.00 mg/dL Final  ?  ?  ?  ?  Failed - eGFR is 10 or above and within 360 days  ?  GFR calc Af Amer  ?Date Value Ref Range Status  ?02/02/2021 4  Final  ? ?GFR calc non Af Amer  ?Date Value Ref Range Status  ?06/30/2020 5 (L) >59 mL/min/1.73 Final  ?  ?  ?  ?  Passed - Patient is not pregnant  ?  ?  Passed - Valid encounter within last 12 months  ?  Recent Outpatient Visits   ? ?      ? 1 month ago Acute non-recurrent maxillary sinusitis  ? [facility] Glean Hess, MD  ? 4 months ago Mood disorder [facility])  ? [facility] Glean Hess, [facility]  ? 7 months ago Annual physical exam  ? [facility] Glean Hess, MD  ? 1 year ago Environmental and seasonal allergies  ? [facility] Gle[facility], [facility]  ? 1 year ago Cellulitis of toe of left foot  ? [facility] Glean Hess, MD  ? ?  ?  ?Future Appointments   ? ?        ? In 4 months [name] [name], MD [facility], [place]  ? ?  ? ?  ?  ?  ? ?

## 2021-10-29 DIAGNOSIS — E1122 Type 2 diabetes mellitus with diabetic chronic kidney disease: Secondary | ICD-10-CM | POA: Diagnosis not present

## 2021-10-29 DIAGNOSIS — Z89511 Acquired absence of right leg below knee: Secondary | ICD-10-CM | POA: Diagnosis not present

## 2021-10-29 DIAGNOSIS — J810 Acute pulmonary edema: Secondary | ICD-10-CM | POA: Diagnosis not present

## 2021-10-29 DIAGNOSIS — Z794 Long term (current) use of insulin: Secondary | ICD-10-CM | POA: Diagnosis not present

## 2021-10-29 DIAGNOSIS — Z89512 Acquired absence of left leg below knee: Secondary | ICD-10-CM | POA: Diagnosis not present

## 2021-10-29 DIAGNOSIS — D649 Anemia, unspecified: Secondary | ICD-10-CM | POA: Diagnosis not present

## 2021-10-29 DIAGNOSIS — E8770 Fluid overload, unspecified: Secondary | ICD-10-CM | POA: Diagnosis not present

## 2021-10-29 DIAGNOSIS — E113313 Type 2 diabetes mellitus with moderate nonproliferative diabetic retinopathy with macular edema, bilateral: Secondary | ICD-10-CM | POA: Diagnosis not present

## 2021-10-29 DIAGNOSIS — I120 Hypertensive chronic kidney disease with stage 5 chronic kidney disease or end stage renal disease: Secondary | ICD-10-CM | POA: Diagnosis not present

## 2021-10-29 DIAGNOSIS — E11649 Type 2 diabetes mellitus with hypoglycemia without coma: Secondary | ICD-10-CM | POA: Diagnosis not present

## 2021-10-29 DIAGNOSIS — J181 Lobar pneumonia, unspecified organism: Secondary | ICD-10-CM | POA: Diagnosis not present

## 2021-10-29 DIAGNOSIS — E1151 Type 2 diabetes mellitus with diabetic peripheral angiopathy without gangrene: Secondary | ICD-10-CM | POA: Diagnosis not present

## 2021-10-29 DIAGNOSIS — D631 Anemia in chronic kidney disease: Secondary | ICD-10-CM | POA: Diagnosis not present

## 2021-10-29 DIAGNOSIS — N186 End stage renal disease: Secondary | ICD-10-CM | POA: Diagnosis not present

## 2021-10-29 DIAGNOSIS — E669 Obesity, unspecified: Secondary | ICD-10-CM | POA: Diagnosis not present

## 2021-10-29 DIAGNOSIS — N2581 Secondary hyperparathyroidism of renal origin: Secondary | ICD-10-CM | POA: Diagnosis not present

## 2021-10-29 DIAGNOSIS — J9601 Acute respiratory failure with hypoxia: Secondary | ICD-10-CM | POA: Diagnosis not present

## 2021-10-30 DIAGNOSIS — R0600 Dyspnea, unspecified: Secondary | ICD-10-CM | POA: Diagnosis not present

## 2021-10-30 DIAGNOSIS — N185 Chronic kidney disease, stage 5: Secondary | ICD-10-CM | POA: Diagnosis not present

## 2021-10-30 DIAGNOSIS — E8770 Fluid overload, unspecified: Secondary | ICD-10-CM | POA: Diagnosis not present

## 2021-10-30 DIAGNOSIS — N289 Disorder of kidney and ureter, unspecified: Secondary | ICD-10-CM | POA: Diagnosis not present

## 2021-10-30 DIAGNOSIS — D649 Anemia, unspecified: Secondary | ICD-10-CM | POA: Diagnosis not present

## 2021-10-30 DIAGNOSIS — E1121 Type 2 diabetes mellitus with diabetic nephropathy: Secondary | ICD-10-CM | POA: Diagnosis not present

## 2021-10-30 DIAGNOSIS — J181 Lobar pneumonia, unspecified organism: Secondary | ICD-10-CM | POA: Diagnosis not present

## 2021-10-31 DIAGNOSIS — E8770 Fluid overload, unspecified: Secondary | ICD-10-CM | POA: Diagnosis not present

## 2021-10-31 DIAGNOSIS — J9601 Acute respiratory failure with hypoxia: Secondary | ICD-10-CM | POA: Diagnosis not present

## 2021-10-31 DIAGNOSIS — I739 Peripheral vascular disease, unspecified: Secondary | ICD-10-CM | POA: Diagnosis not present

## 2021-10-31 DIAGNOSIS — R0600 Dyspnea, unspecified: Secondary | ICD-10-CM | POA: Diagnosis not present

## 2021-10-31 DIAGNOSIS — R0609 Other forms of dyspnea: Secondary | ICD-10-CM | POA: Diagnosis not present

## 2021-10-31 DIAGNOSIS — N185 Chronic kidney disease, stage 5: Secondary | ICD-10-CM | POA: Diagnosis not present

## 2021-10-31 DIAGNOSIS — J810 Acute pulmonary edema: Secondary | ICD-10-CM | POA: Diagnosis not present

## 2021-10-31 DIAGNOSIS — E1122 Type 2 diabetes mellitus with diabetic chronic kidney disease: Secondary | ICD-10-CM | POA: Diagnosis not present

## 2021-11-01 DIAGNOSIS — N185 Chronic kidney disease, stage 5: Secondary | ICD-10-CM | POA: Diagnosis not present

## 2021-11-01 DIAGNOSIS — R0609 Other forms of dyspnea: Secondary | ICD-10-CM | POA: Diagnosis not present

## 2021-11-01 DIAGNOSIS — E1122 Type 2 diabetes mellitus with diabetic chronic kidney disease: Secondary | ICD-10-CM | POA: Diagnosis not present

## 2021-11-01 DIAGNOSIS — J810 Acute pulmonary edema: Secondary | ICD-10-CM | POA: Diagnosis not present

## 2021-11-01 DIAGNOSIS — E8770 Fluid overload, unspecified: Secondary | ICD-10-CM | POA: Diagnosis not present

## 2021-11-01 DIAGNOSIS — R0600 Dyspnea, unspecified: Secondary | ICD-10-CM | POA: Diagnosis not present

## 2021-11-02 DIAGNOSIS — R0609 Other forms of dyspnea: Secondary | ICD-10-CM | POA: Diagnosis not present

## 2021-11-02 DIAGNOSIS — N185 Chronic kidney disease, stage 5: Secondary | ICD-10-CM | POA: Diagnosis not present

## 2021-11-02 DIAGNOSIS — E8770 Fluid overload, unspecified: Secondary | ICD-10-CM | POA: Diagnosis not present

## 2021-11-02 DIAGNOSIS — J810 Acute pulmonary edema: Secondary | ICD-10-CM | POA: Diagnosis not present

## 2021-11-02 DIAGNOSIS — R0600 Dyspnea, unspecified: Secondary | ICD-10-CM | POA: Diagnosis not present

## 2021-11-02 DIAGNOSIS — E1122 Type 2 diabetes mellitus with diabetic chronic kidney disease: Secondary | ICD-10-CM | POA: Diagnosis not present

## 2021-11-03 DIAGNOSIS — R0609 Other forms of dyspnea: Secondary | ICD-10-CM | POA: Diagnosis not present

## 2021-11-03 DIAGNOSIS — E8770 Fluid overload, unspecified: Secondary | ICD-10-CM | POA: Diagnosis not present

## 2021-11-03 DIAGNOSIS — N185 Chronic kidney disease, stage 5: Secondary | ICD-10-CM | POA: Diagnosis not present

## 2021-11-03 DIAGNOSIS — E1122 Type 2 diabetes mellitus with diabetic chronic kidney disease: Secondary | ICD-10-CM | POA: Diagnosis not present

## 2021-11-03 DIAGNOSIS — J810 Acute pulmonary edema: Secondary | ICD-10-CM | POA: Diagnosis not present

## 2021-11-03 DIAGNOSIS — R0600 Dyspnea, unspecified: Secondary | ICD-10-CM | POA: Diagnosis not present

## 2021-11-04 ENCOUNTER — Other Ambulatory Visit: Payer: Self-pay | Admitting: Internal Medicine

## 2021-11-04 DIAGNOSIS — N185 Chronic kidney disease, stage 5: Secondary | ICD-10-CM | POA: Diagnosis not present

## 2021-11-04 DIAGNOSIS — J9801 Acute bronchospasm: Secondary | ICD-10-CM

## 2021-11-04 DIAGNOSIS — E1122 Type 2 diabetes mellitus with diabetic chronic kidney disease: Secondary | ICD-10-CM | POA: Diagnosis not present

## 2021-11-04 DIAGNOSIS — E8770 Fluid overload, unspecified: Secondary | ICD-10-CM | POA: Diagnosis not present

## 2021-11-04 DIAGNOSIS — R0609 Other forms of dyspnea: Secondary | ICD-10-CM | POA: Diagnosis not present

## 2021-11-04 DIAGNOSIS — J810 Acute pulmonary edema: Secondary | ICD-10-CM | POA: Diagnosis not present

## 2021-11-04 DIAGNOSIS — R0600 Dyspnea, unspecified: Secondary | ICD-10-CM | POA: Diagnosis not present

## 2021-11-07 NOTE — Telephone Encounter (Signed)
Requested Prescriptions  ?Pending Prescriptions Disp Refills  ?? albuterol (VENTOLIN HFA) 108 (90 Base) MCG/ACT inhaler [Pharmacy Med Name: ALBUTEROL HFA (VENTOLIN) INH] 18 each 1  ?  Sig: TAKE 2 PUFFS BY MOUTH EVERY 6 HOURS AS NEEDED FOR WHEEZE OR SHORTNESS OF BREATH  ?  ? Pulmonology:  [facility] 2 Passed - 11/04/2021  2:10 AM  ?  ?  Passed - Last BP in normal range  ?  BP Readings from Last 1 Encounters:  ?09/14/21 138/82  ?   ?  ?  Passed - Last Heart Rate in normal range  ?  Pulse Readings from Last 1 Encounters:  ?09/14/21 70  ?   ?  ?  Passed - Valid encounter within last 12 months  ?  Recent Outpatient Visits   ?      ? 1 month ago Acute non-recurrent maxillary sinusitis  ? [facility] Glean Hess, MD  ? 5 months ago Mood disorder [facility])  ? [facility] Glean Hess, MD  ? 8 months ago Annual physical exam  ? [facility] Glean Hess, MD  ? 1 year ago Environmental and seasonal allergies  ? [facility] Glean Hess, MD  ? 1 year ago Cellulitis of toe of left foot  ? [facility] Glean Hess, MD  ?  ?  ?Future Appointments   ?        ? In 4 months [name] [name], MD [facility], [place]  ?  ? ?  ?  ?  ? ?

## 2021-11-08 DIAGNOSIS — N2581 Secondary hyperparathyroidism of renal origin: Secondary | ICD-10-CM | POA: Diagnosis not present

## 2021-11-08 DIAGNOSIS — N186 End stage renal disease: Secondary | ICD-10-CM | POA: Diagnosis not present

## 2021-11-08 DIAGNOSIS — Z992 Dependence on renal dialysis: Secondary | ICD-10-CM | POA: Diagnosis not present

## 2021-11-10 DIAGNOSIS — N186 End stage renal disease: Secondary | ICD-10-CM | POA: Diagnosis not present

## 2021-11-10 DIAGNOSIS — N2581 Secondary hyperparathyroidism of renal origin: Secondary | ICD-10-CM | POA: Diagnosis not present

## 2021-11-10 DIAGNOSIS — Z992 Dependence on renal dialysis: Secondary | ICD-10-CM | POA: Diagnosis not present

## 2021-11-12 DIAGNOSIS — Z992 Dependence on renal dialysis: Secondary | ICD-10-CM | POA: Diagnosis not present

## 2021-11-12 DIAGNOSIS — N186 End stage renal disease: Secondary | ICD-10-CM | POA: Diagnosis not present

## 2021-11-12 DIAGNOSIS — N2581 Secondary hyperparathyroidism of renal origin: Secondary | ICD-10-CM | POA: Diagnosis not present

## 2021-11-12 DIAGNOSIS — Z23 Encounter for immunization: Secondary | ICD-10-CM | POA: Diagnosis not present

## 2021-11-15 DIAGNOSIS — Z23 Encounter for immunization: Secondary | ICD-10-CM | POA: Diagnosis not present

## 2021-11-15 DIAGNOSIS — Z992 Dependence on renal dialysis: Secondary | ICD-10-CM | POA: Diagnosis not present

## 2021-11-15 DIAGNOSIS — N2581 Secondary hyperparathyroidism of renal origin: Secondary | ICD-10-CM | POA: Diagnosis not present

## 2021-11-15 DIAGNOSIS — N186 End stage renal disease: Secondary | ICD-10-CM | POA: Diagnosis not present

## 2021-11-17 DIAGNOSIS — Z992 Dependence on renal dialysis: Secondary | ICD-10-CM | POA: Diagnosis not present

## 2021-11-17 DIAGNOSIS — Z23 Encounter for immunization: Secondary | ICD-10-CM | POA: Diagnosis not present

## 2021-11-17 DIAGNOSIS — N2581 Secondary hyperparathyroidism of renal origin: Secondary | ICD-10-CM | POA: Diagnosis not present

## 2021-11-17 DIAGNOSIS — N186 End stage renal disease: Secondary | ICD-10-CM | POA: Diagnosis not present

## 2021-11-19 DIAGNOSIS — N2581 Secondary hyperparathyroidism of renal origin: Secondary | ICD-10-CM | POA: Diagnosis not present

## 2021-11-19 DIAGNOSIS — Z992 Dependence on renal dialysis: Secondary | ICD-10-CM | POA: Diagnosis not present

## 2021-11-19 DIAGNOSIS — Z23 Encounter for immunization: Secondary | ICD-10-CM | POA: Diagnosis not present

## 2021-11-19 DIAGNOSIS — N186 End stage renal disease: Secondary | ICD-10-CM | POA: Diagnosis not present

## 2021-11-22 DIAGNOSIS — N2581 Secondary hyperparathyroidism of renal origin: Secondary | ICD-10-CM | POA: Diagnosis not present

## 2021-11-22 DIAGNOSIS — N186 End stage renal disease: Secondary | ICD-10-CM | POA: Diagnosis not present

## 2021-11-22 DIAGNOSIS — Z992 Dependence on renal dialysis: Secondary | ICD-10-CM | POA: Diagnosis not present

## 2021-11-22 DIAGNOSIS — Z23 Encounter for immunization: Secondary | ICD-10-CM | POA: Diagnosis not present

## 2021-11-24 DIAGNOSIS — Z992 Dependence on renal dialysis: Secondary | ICD-10-CM | POA: Diagnosis not present

## 2021-11-24 DIAGNOSIS — N186 End stage renal disease: Secondary | ICD-10-CM | POA: Diagnosis not present

## 2021-11-24 DIAGNOSIS — N2581 Secondary hyperparathyroidism of renal origin: Secondary | ICD-10-CM | POA: Diagnosis not present

## 2021-11-24 DIAGNOSIS — Z23 Encounter for immunization: Secondary | ICD-10-CM | POA: Diagnosis not present

## 2021-11-26 DIAGNOSIS — Z992 Dependence on renal dialysis: Secondary | ICD-10-CM | POA: Diagnosis not present

## 2021-11-26 DIAGNOSIS — Z23 Encounter for immunization: Secondary | ICD-10-CM | POA: Diagnosis not present

## 2021-11-26 DIAGNOSIS — N186 End stage renal disease: Secondary | ICD-10-CM | POA: Diagnosis not present

## 2021-11-26 DIAGNOSIS — N2581 Secondary hyperparathyroidism of renal origin: Secondary | ICD-10-CM | POA: Diagnosis not present

## 2021-11-28 ENCOUNTER — Other Ambulatory Visit: Payer: Self-pay

## 2021-11-29 ENCOUNTER — Telehealth: Payer: Self-pay | Admitting: Internal Medicine

## 2021-11-29 ENCOUNTER — Other Ambulatory Visit: Payer: Self-pay

## 2021-11-29 DIAGNOSIS — Z992 Dependence on renal dialysis: Secondary | ICD-10-CM | POA: Diagnosis not present

## 2021-11-29 DIAGNOSIS — N2581 Secondary hyperparathyroidism of renal origin: Secondary | ICD-10-CM | POA: Diagnosis not present

## 2021-11-29 DIAGNOSIS — N186 End stage renal disease: Secondary | ICD-10-CM | POA: Diagnosis not present

## 2021-11-29 DIAGNOSIS — Z23 Encounter for immunization: Secondary | ICD-10-CM | POA: Diagnosis not present

## 2021-11-29 MED ORDER — AMITRIPTYLINE HCL 25 MG PO TABS: 25.0000 mg | ORAL_TABLET | Freq: Every day | ORAL | 0 refills | Status: DC

## 2021-11-29 NOTE — Telephone Encounter (Signed)
Reached pt for clarification. Pt states she has new pharmacy through "Medicaid" States they called practice for verification of RX amitriptyline. States pharmacy was told she was no longer on med. Pt states she had refill last month and does still take. Not aware med was discontinued. States she has meds through May but needs clarification communicated to pharmacy. Please review and advise. Med is not on current profile. Thank you. ?

## 2021-11-29 NOTE — Telephone Encounter (Signed)
Pt called to report that she still takes her Rx because she needs it, it was discontinued yesterday but she needs it called in so she can receive her refill that is due in May. The patient does not know the name of her pharmacy because she says she receives it via Medicaid. [facility].  ?  ?amitriptyline (ELAVIL) 25 MG tablet ?

## 2021-11-29 NOTE — Telephone Encounter (Signed)
[facility] ?

## 2021-12-01 ENCOUNTER — Telehealth: Payer: Self-pay

## 2021-12-01 ENCOUNTER — Other Ambulatory Visit: Payer: Self-pay | Admitting: Internal Medicine

## 2021-12-01 DIAGNOSIS — N186 End stage renal disease: Secondary | ICD-10-CM | POA: Diagnosis not present

## 2021-12-01 DIAGNOSIS — N2581 Secondary hyperparathyroidism of renal origin: Secondary | ICD-10-CM | POA: Diagnosis not present

## 2021-12-01 DIAGNOSIS — Z992 Dependence on renal dialysis: Secondary | ICD-10-CM | POA: Diagnosis not present

## 2021-12-01 DIAGNOSIS — Z23 Encounter for immunization: Secondary | ICD-10-CM | POA: Diagnosis not present

## 2021-12-01 MED ORDER — AMITRIPTYLINE HCL 25 MG PO TABS: 25.0000 mg | ORAL_TABLET | Freq: Every day | ORAL | 0 refills | Status: DC

## 2021-12-01 NOTE — Telephone Encounter (Signed)
Received 2nd request for this medication to be refilled at a different pharmacy.  ?I refilled the 2nd request to the new pharmacy. [facility] in [place] ? ?[facility] [phone] and requested to cancel refill already sent there. I spoke with Anne Ng [phone], who placed a Priority note to cancel this refill. Medication had not been refilled yet.  ?

## 2021-12-01 NOTE — Telephone Encounter (Signed)
Copied from [place] [id number]. Topic: General - Other ?>> Dec 01, 2021 11:31 AM Tessa Lerner A wrote: ?Reason for CRM: Medication Refill - Medication: amitriptyline (ELAVIL) 25 MG tablet [[id number]] ? ?Has the patient contacted their pharmacy? Yes.  The patient's pharmacy has made contact on their behalf  ?(Agent: If no, request that the patient contact the pharmacy for the refill. If patient does not wish to contact the pharmacy document the reason why and proceed with request.) ?(Agent: If yes, when and what did the pharmacy advise?) ? ?Preferred Pharmacy (with phone number or street name): [facility] - [place], [place] - [address] ?[address] [employer], [place] [address] ?Phone: [phone] Fax: [phone] ?Hours: Not open 24 hours ? ?Has the patient been seen for an appointment in the last year OR does the patient have an upcoming appointment? Yes.   ? ?Agent: Please be advised that RX refills may take up to 3 business days. We ask that you follow-up with your pharmacy. ?

## 2021-12-01 NOTE — Telephone Encounter (Signed)
Requested Prescriptions  ?Pending Prescriptions Disp Refills  ?? amitriptyline (ELAVIL) 25 MG tablet 90 tablet 0  ?  Sig: Take 1 tablet (25 mg total) by mouth at bedtime.  ?  ? Psychiatry:  Antidepressants - Heterocyclics (TCAs) Passed - 12/01/2021 12:19 PM  ?  ?  Passed - Valid encounter within last 6 months  ?  Recent Outpatient Visits   ?      ? 2 months ago Acute non-recurrent maxillary sinusitis  ? [facility] Glean Hess, MD  ? 5 months ago Mood disorder [facility])  ? [facility] Glean Hess, MD  ? 9 months ago Annual physical exam  ? [facility] Glean Hess, MD  ? 1 year ago Environmental and seasonal allergies  ? [facility] Glean Hess, MD  ? 1 year ago Cellulitis of toe of left foot  ? [facility] Glean Hess, MD  ?  ?  ?Future Appointments   ?        ? In 3 months [name] [name], MD [facility], [place]  ?  ? ?  ?  ?  ? ?

## 2021-12-02 NOTE — Telephone Encounter (Signed)
Noted  KP 

## 2021-12-03 DIAGNOSIS — N186 End stage renal disease: Secondary | ICD-10-CM | POA: Diagnosis not present

## 2021-12-03 DIAGNOSIS — Z992 Dependence on renal dialysis: Secondary | ICD-10-CM | POA: Diagnosis not present

## 2021-12-03 DIAGNOSIS — Z23 Encounter for immunization: Secondary | ICD-10-CM | POA: Diagnosis not present

## 2021-12-03 DIAGNOSIS — N2581 Secondary hyperparathyroidism of renal origin: Secondary | ICD-10-CM | POA: Diagnosis not present

## 2021-12-06 DIAGNOSIS — N2581 Secondary hyperparathyroidism of renal origin: Secondary | ICD-10-CM | POA: Diagnosis not present

## 2021-12-06 DIAGNOSIS — N186 End stage renal disease: Secondary | ICD-10-CM | POA: Diagnosis not present

## 2021-12-06 DIAGNOSIS — Z23 Encounter for immunization: Secondary | ICD-10-CM | POA: Diagnosis not present

## 2021-12-06 DIAGNOSIS — Z992 Dependence on renal dialysis: Secondary | ICD-10-CM | POA: Diagnosis not present

## 2021-12-08 DIAGNOSIS — Z23 Encounter for immunization: Secondary | ICD-10-CM | POA: Diagnosis not present

## 2021-12-08 DIAGNOSIS — Z992 Dependence on renal dialysis: Secondary | ICD-10-CM | POA: Diagnosis not present

## 2021-12-08 DIAGNOSIS — N186 End stage renal disease: Secondary | ICD-10-CM | POA: Diagnosis not present

## 2021-12-08 DIAGNOSIS — N2581 Secondary hyperparathyroidism of renal origin: Secondary | ICD-10-CM | POA: Diagnosis not present

## 2021-12-10 DIAGNOSIS — Z992 Dependence on renal dialysis: Secondary | ICD-10-CM | POA: Diagnosis not present

## 2021-12-10 DIAGNOSIS — N2581 Secondary hyperparathyroidism of renal origin: Secondary | ICD-10-CM | POA: Diagnosis not present

## 2021-12-10 DIAGNOSIS — Z23 Encounter for immunization: Secondary | ICD-10-CM | POA: Diagnosis not present

## 2021-12-10 DIAGNOSIS — N186 End stage renal disease: Secondary | ICD-10-CM | POA: Diagnosis not present

## 2022-01-07 ENCOUNTER — Other Ambulatory Visit: Payer: Self-pay | Admitting: Internal Medicine

## 2022-01-07 DIAGNOSIS — F39 Unspecified mood [affective] disorder: Secondary | ICD-10-CM

## 2022-01-10 NOTE — Telephone Encounter (Signed)
Rx 12/01/21 #90- too soon Requested Prescriptions  Pending Prescriptions Disp Refills  . amitriptyline (ELAVIL) 25 MG tablet [Pharmacy Med Name: AMITRIPTYLINE HCL 25 MG TAB] 90 tablet 1    Sig: TAKE 1 TABLET BY MOUTH EVERYDAY AT BEDTIME     Psychiatry:  Antidepressants - Heterocyclics (TCAs) Passed - 01/07/2022  1:21 AM      Passed - Valid encounter within last 6 months    Recent Outpatient Visits          3 months ago Acute non-recurrent maxillary sinusitis   [facility] Glean Hess, MD   7 months ago Mood disorder [facility])   [facility] Glean Hess, MD   10 months ago Annual physical exam   [facility] Glean Hess, MD   1 year ago Environmental and seasonal allergies   [facility] Glean Hess, MD   1 year ago Cellulitis of toe of left foot   [facility] Glean Hess, MD      Future Appointments            In 1 month [name], [name], MD [facility], [facility]

## 2022-01-21 ENCOUNTER — Emergency Department (HOSPITAL_COMMUNITY): Payer: Medicare Other

## 2022-01-21 ENCOUNTER — Encounter (HOSPITAL_COMMUNITY): Admission: EM | Disposition: A | Discharge status: Home / Self Care | Payer: Self-pay | Attending: Neurology

## 2022-01-21 ENCOUNTER — Emergency Department (HOSPITAL_COMMUNITY): Payer: Medicare Other | Admitting: Anesthesiology

## 2022-01-21 ENCOUNTER — Inpatient Hospital Stay (HOSPITAL_COMMUNITY)
Admission: EM | Admit: 2022-01-21 | Discharge: 2022-01-24 | DRG: 023 | Disposition: A | Discharge status: Home / Self Care | Payer: Medicare Other | Source: Other Acute Inpatient Hospital | Attending: Internal Medicine | Admitting: Internal Medicine

## 2022-01-21 DIAGNOSIS — I639 Cerebral infarction, unspecified: Secondary | ICD-10-CM

## 2022-01-21 DIAGNOSIS — Z794 Long term (current) use of insulin: Secondary | ICD-10-CM

## 2022-01-21 DIAGNOSIS — N186 End stage renal disease: Secondary | ICD-10-CM | POA: Diagnosis present

## 2022-01-21 DIAGNOSIS — Z91010 Allergy to peanuts: Secondary | ICD-10-CM | POA: Diagnosis not present

## 2022-01-21 DIAGNOSIS — E119 Type 2 diabetes mellitus without complications: Secondary | ICD-10-CM | POA: Diagnosis present

## 2022-01-21 DIAGNOSIS — Z882 Allergy status to sulfonamides status: Secondary | ICD-10-CM

## 2022-01-21 DIAGNOSIS — I120 Hypertensive chronic kidney disease with stage 5 chronic kidney disease or end stage renal disease: Secondary | ICD-10-CM

## 2022-01-21 DIAGNOSIS — E669 Obesity, unspecified: Secondary | ICD-10-CM | POA: Diagnosis present

## 2022-01-21 DIAGNOSIS — I63511 Cerebral infarction due to unspecified occlusion or stenosis of right middle cerebral artery: Principal | ICD-10-CM | POA: Diagnosis present

## 2022-01-21 DIAGNOSIS — Z833 Family history of diabetes mellitus: Secondary | ICD-10-CM

## 2022-01-21 DIAGNOSIS — R29716 NIHSS score 16: Secondary | ICD-10-CM | POA: Diagnosis present

## 2022-01-21 DIAGNOSIS — N185 Chronic kidney disease, stage 5: Secondary | ICD-10-CM | POA: Diagnosis present

## 2022-01-21 DIAGNOSIS — Z7982 Long term (current) use of aspirin: Secondary | ICD-10-CM | POA: Diagnosis not present

## 2022-01-21 DIAGNOSIS — G8194 Hemiplegia, unspecified affecting left nondominant side: Secondary | ICD-10-CM | POA: Diagnosis present

## 2022-01-21 DIAGNOSIS — Z8673 Personal history of transient ischemic attack (TIA), and cerebral infarction without residual deficits: Secondary | ICD-10-CM

## 2022-01-21 DIAGNOSIS — I10 Essential (primary) hypertension: Secondary | ICD-10-CM

## 2022-01-21 DIAGNOSIS — Z66 Do not resuscitate: Secondary | ICD-10-CM | POA: Diagnosis present

## 2022-01-21 DIAGNOSIS — N189 Chronic kidney disease, unspecified: Secondary | ICD-10-CM | POA: Diagnosis present

## 2022-01-21 DIAGNOSIS — Z89512 Acquired absence of left leg below knee: Secondary | ICD-10-CM

## 2022-01-21 DIAGNOSIS — E785 Hyperlipidemia, unspecified: Secondary | ICD-10-CM | POA: Diagnosis present

## 2022-01-21 DIAGNOSIS — Z6836 Body mass index (BMI) 36.0-36.9, adult: Secondary | ICD-10-CM

## 2022-01-21 DIAGNOSIS — D631 Anemia in chronic kidney disease: Secondary | ICD-10-CM | POA: Diagnosis present

## 2022-01-21 DIAGNOSIS — Z89511 Acquired absence of right leg below knee: Secondary | ICD-10-CM

## 2022-01-21 DIAGNOSIS — Z91013 Allergy to seafood: Secondary | ICD-10-CM | POA: Diagnosis not present

## 2022-01-21 DIAGNOSIS — E1122 Type 2 diabetes mellitus with diabetic chronic kidney disease: Secondary | ICD-10-CM | POA: Diagnosis present

## 2022-01-21 DIAGNOSIS — Z992 Dependence on renal dialysis: Secondary | ICD-10-CM

## 2022-01-21 DIAGNOSIS — E118 Type 2 diabetes mellitus with unspecified complications: Secondary | ICD-10-CM | POA: Diagnosis present

## 2022-01-21 DIAGNOSIS — N2581 Secondary hyperparathyroidism of renal origin: Secondary | ICD-10-CM | POA: Diagnosis present

## 2022-01-21 DIAGNOSIS — J9801 Acute bronchospasm: Secondary | ICD-10-CM

## 2022-01-21 DIAGNOSIS — R471 Dysarthria and anarthria: Secondary | ICD-10-CM | POA: Diagnosis present

## 2022-01-21 HISTORY — PX: IR CT HEAD LTD: IMG2386

## 2022-01-21 HISTORY — PX: IR US GUIDE VASC ACCESS RIGHT: IMG2390

## 2022-01-21 HISTORY — PX: RADIOLOGY WITH ANESTHESIA: SHX6223

## 2022-01-21 HISTORY — DX: Cerebral infarction, unspecified: I63.9

## 2022-01-21 HISTORY — PX: IR PERCUTANEOUS ART THROMBECTOMY/INFUSION INTRACRANIAL INC DIAG ANGIO: IMG6087

## 2022-01-21 LAB — PROTIME-INR
INR: 1.2 (ref 0.8–1.2)
Prothrombin Time: 14.6 seconds (ref 11.4–15.2)

## 2022-01-21 LAB — DIFFERENTIAL
Abs Immature Granulocytes: 0.01 10*3/uL (ref 0.00–0.07)
Basophils Absolute: 0 10*3/uL (ref 0.0–0.1)
Basophils Relative: 1 %
Eosinophils Absolute: 0.4 10*3/uL (ref 0.0–0.5)
Eosinophils Relative: 5 %
Immature Granulocytes: 0 %
Lymphocytes Relative: 33 %
Lymphs Abs: 2.7 10*3/uL (ref 0.7–4.0)
Monocytes Absolute: 0.6 10*3/uL (ref 0.1–1.0)
Monocytes Relative: 7 %
Neutro Abs: 4.4 10*3/uL (ref 1.7–7.7)
Neutrophils Relative %: 54 %

## 2022-01-21 LAB — CBC
HCT: 35.4 % — ABNORMAL LOW (ref 36.0–46.0)
Hemoglobin: 10.9 g/dL — ABNORMAL LOW (ref 12.0–15.0)
MCH: 29.1 pg (ref 26.0–34.0)
MCHC: 30.8 g/dL (ref 30.0–36.0)
MCV: 94.4 fL (ref 80.0–100.0)
Platelets: 181 10*3/uL (ref 150–400)
RBC: 3.75 MIL/uL — ABNORMAL LOW (ref 3.87–5.11)
RDW: 15.5 % (ref 11.5–15.5)
WBC: 8 10*3/uL (ref 4.0–10.5)
nRBC: 0 % (ref 0.0–0.2)

## 2022-01-21 LAB — HIV ANTIBODY (ROUTINE TESTING W REFLEX): HIV Screen 4th Generation wRfx: NONREACTIVE

## 2022-01-21 LAB — I-STAT CHEM 8, ED
BUN: 44 mg/dL — ABNORMAL HIGH (ref 6–20)
Calcium, Ion: 1.07 mmol/L — ABNORMAL LOW (ref 1.15–1.40)
Chloride: 97 mmol/L — ABNORMAL LOW (ref 98–111)
Creatinine, Ser: 9.9 mg/dL — ABNORMAL HIGH (ref 0.44–1.00)
Glucose, Bld: 142 mg/dL — ABNORMAL HIGH (ref 70–99)
HCT: 35 % — ABNORMAL LOW (ref 36.0–46.0)
Hemoglobin: 11.9 g/dL — ABNORMAL LOW (ref 12.0–15.0)
Potassium: 3.7 mmol/L (ref 3.5–5.1)
Sodium: 138 mmol/L (ref 135–145)
TCO2: 25 mmol/L (ref 22–32)

## 2022-01-21 LAB — COMPREHENSIVE METABOLIC PANEL
ALT: 9 U/L (ref 0–44)
AST: 12 U/L — ABNORMAL LOW (ref 15–41)
Albumin: 3.5 g/dL (ref 3.5–5.0)
Alkaline Phosphatase: 153 U/L — ABNORMAL HIGH (ref 38–126)
Anion gap: 12 (ref 5–15)
BUN: 47 mg/dL — ABNORMAL HIGH (ref 6–20)
CO2: 27 mmol/L (ref 22–32)
Calcium: 9.6 mg/dL (ref 8.9–10.3)
Chloride: 98 mmol/L (ref 98–111)
Creatinine, Ser: 9.16 mg/dL — ABNORMAL HIGH (ref 0.44–1.00)
GFR, Estimated: 5 mL/min — ABNORMAL LOW (ref >60)
Glucose, Bld: 147 mg/dL — ABNORMAL HIGH (ref 70–99)
Potassium: 3.7 mmol/L (ref 3.5–5.1)
Sodium: 137 mmol/L (ref 135–145)
Total Bilirubin: 0.6 mg/dL (ref 0.3–1.2)
Total Protein: 7.2 g/dL (ref 6.5–8.1)

## 2022-01-21 LAB — GLUCOSE, CAPILLARY
Glucose-Capillary: 125 mg/dL — ABNORMAL HIGH (ref 70–99)
Glucose-Capillary: 173 mg/dL — ABNORMAL HIGH (ref 70–99)
Glucose-Capillary: 229 mg/dL — ABNORMAL HIGH (ref 70–99)

## 2022-01-21 LAB — APTT: aPTT: 29 seconds (ref 24–36)

## 2022-01-21 LAB — I-STAT BETA HCG BLOOD, ED (MC, WL, AP ONLY): I-stat hCG, quantitative: 11 m[IU]/mL — ABNORMAL HIGH (ref <5)

## 2022-01-21 LAB — CBG MONITORING, ED: Glucose-Capillary: 151 mg/dL — ABNORMAL HIGH (ref 70–99)

## 2022-01-21 SURGERY — IR WITH ANESTHESIA
Anesthesia: General

## 2022-01-21 MED ORDER — ESMOLOL HCL 100 MG/10ML IV SOLN
INTRAVENOUS | Status: DC | PRN
  Administered 2022-01-21 (×2): 20 mg via INTRAVENOUS

## 2022-01-21 MED ORDER — PANTOPRAZOLE SODIUM 40 MG IV SOLR
40.0000 mg | Freq: Every day | INTRAVENOUS | Status: DC
  Administered 2022-01-21: 40 mg via INTRAVENOUS
  Filled 2022-01-21: qty 10

## 2022-01-21 MED ORDER — CEFAZOLIN SODIUM-DEXTROSE 2-3 GM-%(50ML) IV SOLR
INTRAVENOUS | Status: DC | PRN
  Administered 2022-01-21: 2 g via INTRAVENOUS

## 2022-01-21 MED ORDER — NITROGLYCERIN 1 MG/10 ML FOR IR/CATH LAB
INTRA_ARTERIAL | Status: AC
  Filled 2022-01-21: qty 10

## 2022-01-21 MED ORDER — MONTELUKAST SODIUM 10 MG PO TABS
10.0000 mg | ORAL_TABLET | Freq: Every day | ORAL | Status: DC
  Administered 2022-01-21: 10 mg via ORAL
  Filled 2022-01-21 (×3): qty 1

## 2022-01-21 MED ORDER — TENECTEPLASE FOR STROKE
25.0000 mg | PACK | Freq: Once | INTRAVENOUS | Status: AC
  Administered 2022-01-21: 25 mg via INTRAVENOUS
  Filled 2022-01-21: qty 10

## 2022-01-21 MED ORDER — CANGRELOR TETRASODIUM 50 MG IV SOLR
INTRAVENOUS | Status: AC
  Filled 2022-01-21: qty 50

## 2022-01-21 MED ORDER — IOHEXOL 300 MG/ML  SOLN
100.0000 mL | Freq: Once | INTRAMUSCULAR | Status: AC | PRN
  Administered 2022-01-21: 60 mL via INTRA_ARTERIAL

## 2022-01-21 MED ORDER — SUCCINYLCHOLINE CHLORIDE 200 MG/10ML IV SOSY
PREFILLED_SYRINGE | INTRAVENOUS | Status: DC | PRN
  Administered 2022-01-21: 120 mg via INTRAVENOUS

## 2022-01-21 MED ORDER — CLEVIDIPINE BUTYRATE 0.5 MG/ML IV EMUL
INTRAVENOUS | Status: AC
  Filled 2022-01-21: qty 50

## 2022-01-21 MED ORDER — CLEVIDIPINE BUTYRATE 0.5 MG/ML IV EMUL
0.0000 mg/h | INTRAVENOUS | Status: DC
  Administered 2022-01-21 (×2): 4 mg/h via INTRAVENOUS
  Filled 2022-01-21 (×2): qty 50

## 2022-01-21 MED ORDER — IOHEXOL 350 MG/ML SOLN
80.0000 mL | Freq: Once | INTRAVENOUS | Status: AC | PRN
  Administered 2022-01-21: 80 mL via INTRAVENOUS

## 2022-01-21 MED ORDER — EPTIFIBATIDE 20 MG/10ML IV SOLN
INTRAVENOUS | Status: AC
  Filled 2022-01-21: qty 10

## 2022-01-21 MED ORDER — FENTANYL CITRATE (PF) 100 MCG/2ML IJ SOLN
INTRAMUSCULAR | Status: DC | PRN
Start: 2022-01-21 — End: 2022-01-21
  Administered 2022-01-21: 100 ug via INTRAVENOUS

## 2022-01-21 MED ORDER — ROCURONIUM BROMIDE 100 MG/10ML IV SOLN: INTRAVENOUS | Status: DC | PRN

## 2022-01-21 MED ORDER — STROKE: EARLY STAGES OF RECOVERY BOOK
Freq: Once | Status: AC
  Filled 2022-01-21: qty 1

## 2022-01-21 MED ORDER — ACETAMINOPHEN 650 MG RE SUPP: 650.0000 mg | RECTAL | Status: DC | PRN

## 2022-01-21 MED ORDER — IOHEXOL 300 MG/ML  SOLN: 100.0000 mL | Freq: Once | INTRAMUSCULAR | Status: DC | PRN

## 2022-01-21 MED ORDER — LIDOCAINE 2% (20 MG/ML) 5 ML SYRINGE
INTRAMUSCULAR | Status: DC | PRN
  Administered 2022-01-21: 60 mg via INTRAVENOUS

## 2022-01-21 MED ORDER — ASPIRIN 81 MG PO CHEW
CHEWABLE_TABLET | ORAL | Status: AC
  Filled 2022-01-21: qty 1

## 2022-01-21 MED ORDER — CLOPIDOGREL BISULFATE 300 MG PO TABS
ORAL_TABLET | ORAL | Status: AC
  Filled 2022-01-21: qty 1

## 2022-01-21 MED ORDER — PRAVASTATIN SODIUM 10 MG PO TABS
20.0000 mg | ORAL_TABLET | Freq: Every day | ORAL | Status: DC
  Administered 2022-01-21: 20 mg via ORAL
  Filled 2022-01-21: qty 2

## 2022-01-21 MED ORDER — ACETAMINOPHEN 160 MG/5ML PO SOLN: 650.0000 mg | ORAL | Status: DC | PRN

## 2022-01-21 MED ORDER — ACETAMINOPHEN 325 MG PO TABS
650.0000 mg | ORAL_TABLET | ORAL | Status: DC | PRN
  Administered 2022-01-22: 650 mg via ORAL
  Filled 2022-01-21: qty 2

## 2022-01-21 MED ORDER — DEXAMETHASONE SODIUM PHOSPHATE 10 MG/ML IJ SOLN
INTRAMUSCULAR | Status: DC | PRN
  Administered 2022-01-21: 5 mg via INTRAVENOUS

## 2022-01-21 MED ORDER — METOPROLOL SUCCINATE ER 100 MG PO TB24
200.0000 mg | ORAL_TABLET | Freq: Every day | ORAL | Status: DC
  Administered 2022-01-21 – 2022-01-24 (×3): 200 mg via ORAL
  Filled 2022-01-21: qty 4
  Filled 2022-01-21: qty 2
  Filled 2022-01-21: qty 4

## 2022-01-21 MED ORDER — VERAPAMIL HCL 2.5 MG/ML IV SOLN
INTRAVENOUS | Status: AC
  Filled 2022-01-21: qty 2

## 2022-01-21 MED ORDER — TICAGRELOR 90 MG PO TABS
ORAL_TABLET | ORAL | Status: AC
  Filled 2022-01-21: qty 2

## 2022-01-21 MED ORDER — IOHEXOL 350 MG/ML SOLN: 100.0000 mL | Freq: Once | INTRAVENOUS | Status: DC | PRN

## 2022-01-21 MED ORDER — FENTANYL CITRATE (PF) 100 MCG/2ML IJ SOLN
INTRAMUSCULAR | Status: AC
  Filled 2022-01-21: qty 2

## 2022-01-21 MED ORDER — PROPOFOL 10 MG/ML IV BOLUS
INTRAVENOUS | Status: DC | PRN
  Administered 2022-01-21: 100 mg via INTRAVENOUS

## 2022-01-21 MED ORDER — SODIUM CHLORIDE 0.9% FLUSH: 3.0000 mL | Freq: Once | INTRAVENOUS | Status: DC

## 2022-01-21 MED ORDER — ONDANSETRON HCL 4 MG/2ML IJ SOLN
INTRAMUSCULAR | Status: DC | PRN
  Administered 2022-01-21: 4 mg via INTRAVENOUS

## 2022-01-21 MED ORDER — INSULIN ASPART 100 UNIT/ML IJ SOLN
0.0000 [IU] | INTRAMUSCULAR | Status: DC
  Administered 2022-01-21: 5 [IU] via SUBCUTANEOUS
  Administered 2022-01-21 – 2022-01-22 (×3): 3 [IU] via SUBCUTANEOUS
  Administered 2022-01-22: 2 [IU] via SUBCUTANEOUS
  Administered 2022-01-22: 5 [IU] via SUBCUTANEOUS
  Administered 2022-01-22 (×2): 3 [IU] via SUBCUTANEOUS
  Administered 2022-01-23 (×2): 2 [IU] via SUBCUTANEOUS

## 2022-01-21 MED ORDER — SUGAMMADEX SODIUM 200 MG/2ML IV SOLN
INTRAVENOUS | Status: DC | PRN
  Administered 2022-01-21: 200 mg via INTRAVENOUS

## 2022-01-21 MED ORDER — PHENYLEPHRINE HCL-NACL 20-0.9 MG/250ML-% IV SOLN
INTRAVENOUS | Status: DC | PRN
  Administered 2022-01-21: 50 ug/min via INTRAVENOUS

## 2022-01-21 MED ORDER — SODIUM CHLORIDE 0.9 % IV SOLN: INTRAVENOUS | Status: DC | PRN

## 2022-01-21 MED ORDER — CEFAZOLIN SODIUM-DEXTROSE 2-4 GM/100ML-% IV SOLN
INTRAVENOUS | Status: AC
  Filled 2022-01-21: qty 100

## 2022-01-21 MED ORDER — ROCURONIUM BROMIDE 10 MG/ML (PF) SYRINGE
PREFILLED_SYRINGE | INTRAVENOUS | Status: DC | PRN
  Administered 2022-01-21: 60 mg via INTRAVENOUS

## 2022-01-21 NOTE — Assessment & Plan Note (Signed)
Due to DM and HTN Makes some urine   - Contact nephrology for scheduled HD via fistula

## 2022-01-21 NOTE — Consult Note (Signed)
   NAME:  Cindy Osborne, MRN:  [id number], DOB:  22-Apr-1966, LOS: 0 ADMISSION DATE:  01/21/2022, CONSULTATION DATE:  01/21/2022 REFERRING MD:  Leonel Ramsay, CHIEF COMPLAINT:  acute stroke   History of Present Illness:  56 year old woman who developed sudden left sided weakness while waiting for HD.  Arrived via EMS - CT showed acute right M1 stroke Mechanically revascularized.   Pertinent  Medical History   Past Medical History:  Diagnosis Date   Amputated toe ([place]) 01/31/2012   Right second toe distal phalanx Right great toe   Diabetes mellitus without complication ([place])    History of diabetic ulcer of foot 04/01/2014   Hyperlipidemia    Hypertension     Significant Hospital Events: Including procedures, antibiotic start and stop dates in addition to other pertinent events   6/10 Distal Rt M1 occlusion, Mechanical thrombectomy was performed by an ADAPT(A direct Aspiration First Pass Technique) approach with TICI 3 results at the first pass  Interim History / Subjective:  Arrived extubated with no discomfort.   Objective   Blood pressure 132/72, pulse 80, temperature 97.7 F (36.5 C), temperature source Axillary, resp. rate 17, weight 111.6 kg, SpO2 100 %.        Intake/Output Summary (Last 24 hours) at 01/21/2022 1520 Last data filed at 01/21/2022 1327 Gross per 24 hour  Intake 350 ml  Output 20 ml  Net 330 ml   Filed Weights   01/21/22 1100  Weight: 111.6 kg    Examination: General: Obese, no distress.  HENT: Moist mucosae with no carotid bruits Lungs: clear Cardiovascular: HS are normal  Abdomen: soft Extremities: bilateral BKA Neuro: awake and answers appropriately, moves all limbs but less spontaneous movement on left side GU: Fistula for HD  Ancillary tests   El;evated creatininie but other electrolytes nomal.   Assessment & Plan:  Arterial ischemic stroke, MCA (middle cerebral artery), right, acute (HCC) Improving strength on left. Remains  dysarthric.  - Secondary stroke prevention: ASA, Brillinta, statin - Echo pending.  - MRI at 24h mark - Arrhythmia monitoring for afib. - Swallow evaluation.   CKD (chronic kidney disease), stage V (HCC) Due to DM and HTN Makes some urine   - Contact nephrology for scheduled HD via fistula  Hypertension Currently requiring Cleviprex to maintain goal BP 130-150 following intervention.   - Restart oral medications once she is cleared to swallow.    Best Practice (right click and "Reselect all SmartList Selections" daily)   Diet/type: full liquids  DVT prophylaxis: other GI prophylaxis: DAPT Lines: N/A Foley:  N/A Code Status:  DNR Last date of multidisciplinary goals of care discussion [6/10, patient updated]   CRITICAL CARE Performed by: Kipp Brood   Total critical care time: 35 minutes  Critical care time was exclusive of separately billable procedures and treating other patients.  Critical care was necessary to treat or prevent imminent or life-threatening deterioration.  Critical care was time spent personally by me on the following activities: development of treatment plan with patient and/or surrogate as well as nursing, discussions with consultants, evaluation of patient's response to treatment, examination of patient, obtaining history from patient or surrogate, ordering and performing treatments and interventions, ordering and review of laboratory studies, ordering and review of radiographic studies, pulse oximetry, re-evaluation of patient's condition and participation in multidisciplinary rounds.  Kipp Brood, MD [facility] ICU Physician [place]  Pager: [phone] Mobile: [phone] After hours: [phone].

## 2022-01-21 NOTE — Anesthesia Postprocedure Evaluation (Signed)
Anesthesia Post Note  Patient: Cindy Osborne  Procedure(s) Performed: IR WITH ANESTHESIA     Patient location during evaluation: PACU Anesthesia Type: General Level of consciousness: awake and alert Pain management: pain level controlled Vital Signs Assessment: post-procedure vital signs reviewed and stable Respiratory status: spontaneous breathing, nonlabored ventilation, respiratory function stable and patient connected to nasal cannula oxygen Cardiovascular status: blood pressure returned to baseline and stable Postop Assessment: no apparent nausea or vomiting Anesthetic complications: no   No notable events documented.  Last Vitals:  Vitals:   01/21/22 1600 01/21/22 1630  BP: 132/72 129/75  Pulse: 81 80  Resp: 15 18  Temp: 36.5 C   SpO2: 97% 94%    Last Pain:  Vitals:   01/21/22 1600  TempSrc: Axillary                 Belenda Cruise P Greysen [place]

## 2022-01-21 NOTE — Procedures (Signed)
  Procedure: Mechanical Thrombectomy  for distal Rt M1 occlusion Preprocedure diagnosis: Distal Rt M1 occlusion Postprocedure diagnosis: same EBL:    minimal Complications:   none immediate  Findings: Distal Rt M1 occlusion, Mechanical thrombectomy was performed by an ADAPT(A direct Aspiration First Pass Technique) approach with TICI 3 results at the first pass.  No intracranial bleed  See full dictation in [employer].  Frazier Richards, MD Main # [phone] Mobile [phone]

## 2022-01-21 NOTE — Anesthesia Procedure Notes (Signed)
Arterial Line Insertion Start/End6/05/2022 12:28 PM, 01/21/2022 12:35 PM Performed by: Dorann Lodge, CRNA, CRNA  Patient location: Pre-op. Preanesthetic checklist: patient identified, IV checked, site marked, risks and benefits discussed, surgical consent, monitors and equipment checked, pre-op evaluation, timeout performed and anesthesia consent Lidocaine 1% used for infiltration Right, radial was placed Catheter size: 20 G Hand hygiene performed  and maximum sterile barriers used   Attempts: 1 Procedure performed without using ultrasound guided technique. Following insertion, dressing applied and Biopatch. Post procedure assessment: normal and unchanged  Patient tolerated the procedure well with no immediate complications.

## 2022-01-21 NOTE — H&P (Signed)
Neurology H&P  CC: Left-sided weakness  History is obtained from: Patient, EMS  HPI: Cindy Osborne is a 56 y.o. female who was in her normal state of health this morning.  She was normal on arrival to the dialysis center when she checked in.  When folks at dialysis went to get her to come back, she had begun having problems and was unable to walk.  EMS was called and a code stroke was activated.  On arrival, she presented with signs and symptoms of a right MCA syndrome.  She does have bilateral BKA's, but lives alone and takes care of all of her own normal activities of daily living.  LKW: 11 AM tnk given?:  Yes IR Thrombectomy?  Yes Modified Rankin Scale: 2-Slight disability-UNABLE to perform all activities but does not need assistance NIHSS: 16   Past Medical History:  Diagnosis Date   Amputated toe ([place]) 01/31/2012   Right second toe distal phalanx Right great toe   Diabetes mellitus without complication ([place])    History of diabetic ulcer of foot 04/01/2014   Hyperlipidemia    Hypertension      Family History  Problem Relation Age of Onset   Diabetes Mother    Diabetes Sister      Social History:  reports that she has never smoked. She has never used smokeless tobacco. She reports current alcohol use. She reports that she does not use drugs.   Prior to Admission medications   Medication Sig Start Date End Date Taking? Authorizing Provider  Acetaminophen (TYLENOL PO) Take 1,000 mg by mouth daily as needed.    [provider]  albuterol (VENTOLIN HFA) 108 (90 Base) MCG/ACT inhaler TAKE 2 PUFFS BY MOUTH EVERY 6 HOURS AS NEEDED FOR WHEEZE OR SHORTNESS OF BREATH 11/07/21   Glean Hess, MD  amitriptyline (ELAVIL) 25 MG tablet Take 1 tablet (25 mg total) by mouth at bedtime. 12/01/21   Glean Hess, MD  amLODipine (NORVASC) 10 MG tablet Take 1 tablet by mouth daily.    [provider]  ASPIRIN LOW DOSE 81 MG EC tablet TAKE 1 TABLET BY MOUTH EVERY  DAY 10/28/21   Glean Hess, MD  fexofenadine (ALLEGRA) 180 MG tablet Take 180 mg by mouth daily.    [provider]  fluticasone (FLONASE) 50 MCG/ACT nasal spray Place 2 sprays into both nostrils daily. 10/26/20   Glean Hess, MD  furosemide (LASIX) 80 MG tablet Take 80 mg by mouth every morning. 12/26/20   [provider]  insulin aspart (NOVOLOG) 100 UNIT/ML FlexPen Inject 5 Units into the skin 3 (three) times daily before meals. 10/18/18 09/14/21  [provider]  Insulin Glargine (BASAGLAR KWIKPEN [place]) Inject 18 Units into the skin daily.    [provider]  levonorgestrel (MIRENA) 20 MCG/24HR IUD by Intrauterine route.    [provider]  metoprolol (TOPROL-XL) 200 MG 24 hr tablet Take 200 mg by mouth daily. 04/02/20   [provider]  montelukast (SINGULAIR) 10 MG tablet TAKE 1 TABLET BY MOUTH EVERYDAY AT BEDTIME 10/25/21   Glean Hess, MD  mupirocin ointment (BACTROBAN) 2 % mupirocin 2 % topical ointment    [provider]  pravastatin (PRAVACHOL) 20 MG tablet TAKE 1 TABLET BY MOUTH EVERY DAY 03/26/21   Glean Hess, MD  sennosides-docusate sodium (SENOKOT-S) 8.6-50 MG tablet Take 1 tablet by mouth as needed for constipation.    [provider]  sodium bicarbonate 650 MG tablet Take  by mouth 3 (three) times daily. 11/14/14   [provider]  sucroferric oxyhydroxide (VELPHORO) 500 MG chewable tablet Chew 500 mg by mouth in the morning and at bedtime.    [provider]  triamcinolone cream (KENALOG) 0.1 % Apply 1 application topically 2 (two) times daily. To rash on leg 03/03/21   Glean Hess, MD     Exam: Current vital signs: Wt 111.6 kg   BMI 36.33 kg/m    Physical Exam  Constitutional: Appears well-developed and well-nourished.  Psych: Affect appropriate to situation Eyes: No scleral injection HENT: No OP obstrucion Head: Normocephalic.  Cardiovascular: Normal rate and  regular rhythm.  Respiratory: Effort normal and breath sounds normal to anterior ascultation GI: Soft.  No distension. There is no tenderness.  Skin: WDI  Neuro: Mental Status: Patient is awake, alert, oriented to month and year, no signs of aphasia but she does have a severe neglect.  She does not recognize her own hand in front of her face, does not recommend that she has any deficits on the left side Cranial Nerves: II: She has a left hemianopia. Pupils are equal, round, and reactive to light.   III,IV, VI: She has a left gaze preference, comes to midline but does not cross midline. VII: Facial movement is diminished in the left Motor: She has a severe left hemiparesis, minimal movement both her arm and leg. 1/5 Sensory: She has a severe reduction in sensation on the left Cerebellar: Does not perform  I have reviewed labs in epic and the pertinent results are: Results for orders placed or performed during the hospital encounter of 01/21/22 (from the past 24 hour(s))  Protime-INR     Status: None   Collection Time: 01/21/22 11:47 AM  Result Value Ref Range   Prothrombin Time 14.6 11.4 - 15.2 seconds   INR 1.2 0.8 - 1.2  APTT     Status: None   Collection Time: 01/21/22 11:47 AM  Result Value Ref Range   aPTT 29 24 - 36 seconds  CBC     Status: Abnormal   Collection Time: 01/21/22 11:47 AM  Result Value Ref Range   WBC 8.0 4.0 - 10.5 K/uL   RBC 3.75 (L) 3.87 - 5.11 MIL/uL   Hemoglobin 10.9 (L) 12.0 - 15.0 g/dL   HCT 35.4 (L) 36.0 - 46.0 %   MCV 94.4 80.0 - 100.0 fL   MCH 29.1 26.0 - 34.0 pg   MCHC 30.8 30.0 - 36.0 g/dL   RDW 15.5 11.5 - 15.5 %   Platelets 181 150 - 400 K/uL   nRBC 0.0 0.0 - 0.2 %  Differential     Status: None   Collection Time: 01/21/22 11:47 AM  Result Value Ref Range   Neutrophils Relative % 54 %   Neutro Abs 4.4 1.7 - 7.7 K/uL   Lymphocytes Relative 33 %   Lymphs Abs 2.7 0.7 - 4.0 K/uL   Monocytes Relative 7 %   Monocytes Absolute 0.6 0.1 - 1.0  K/uL   Eosinophils Relative 5 %   Eosinophils Absolute 0.4 0.0 - 0.5 K/uL   Basophils Relative 1 %   Basophils Absolute 0.0 0.0 - 0.1 K/uL   Immature Granulocytes 0 %   Abs Immature Granulocytes 0.01 0.00 - 0.07 K/uL  Comprehensive metabolic panel     Status: Abnormal   Collection Time: 01/21/22 11:47 AM  Result Value Ref Range   Sodium 137 135 - 145 mmol/L   Potassium 3.7  3.5 - 5.1 mmol/L   Chloride 98 98 - 111 mmol/L   CO2 27 22 - 32 mmol/L   Glucose, Bld 147 (H) 70 - 99 mg/dL   BUN 47 (H) 6 - 20 mg/dL   Creatinine, Ser 9.16 (H) 0.44 - 1.00 mg/dL   Calcium 9.6 8.9 - 10.3 mg/dL   Total Protein 7.2 6.5 - 8.1 g/dL   Albumin 3.5 3.5 - 5.0 g/dL   AST 12 (L) 15 - 41 U/L   ALT 9 0 - 44 U/L   Alkaline Phosphatase 153 (H) 38 - 126 U/L   Total Bilirubin 0.6 0.3 - 1.2 mg/dL   GFR, Estimated 5 (L) >60 mL/min   Anion gap 12 5 - 15  CBG monitoring, ED     Status: Abnormal   Collection Time: 01/21/22 11:50 AM  Result Value Ref Range   Glucose-Capillary 151 (H) 70 - 99 mg/dL  I-Stat beta hCG blood, ED     Status: Abnormal   Collection Time: 01/21/22 11:52 AM  Result Value Ref Range   I-stat hCG, quantitative 11.0 (H) <5 mIU/mL   Comment 3          I-stat chem 8, ED     Status: Abnormal   Collection Time: 01/21/22 11:54 AM  Result Value Ref Range   Sodium 138 135 - 145 mmol/L   Potassium 3.7 3.5 - 5.1 mmol/L   Chloride 97 (L) 98 - 111 mmol/L   BUN 44 (H) 6 - 20 mg/dL   Creatinine, Ser 9.90 (H) 0.44 - 1.00 mg/dL   Glucose, Bld 142 (H) 70 - 99 mg/dL   Calcium, Ion 1.07 (L) 1.15 - 1.40 mmol/L   TCO2 25 22 - 32 mmol/L   Hemoglobin 11.9 (L) 12.0 - 15.0 g/dL   HCT 35.0 (L) 36.0 - 46.0 %     I have reviewed the images obtained: CT head-no acute findings, CTA-right M1 occlusion  Primary Diagnosis:  Cerebral infarction due to embolism of  right middle cerebral artery.   Secondary Diagnosis: Essential (primary) hypertension, Type 2 diabetes mellitus with hyperglycemia , Obesity, and  ESRD   Impression: 56 year old female with right M1 occlusion.  She has received IV tenecteplase and we will proceed with thrombectomy. The number had for sister appears to be incorrect and therefore we are proceeding with emergency consent, the patient does indicate that she wants to proceed as well.  Plan: - HgbA1c, fasting lipid panel - MRI of the brain without contrast - Frequent neuro checks - Echocardiogram - Prophylactic therapy-none for 24 hours - Risk factor modification - Telemetry monitoring - PT consult, OT consult, Speech consult - Stroke team to follow  - SSI for DM - hold home anti-htn meds for now, will use PRN labetalol if needed    This patient is critically ill and at significant risk of neurological worsening, death and care requires constant monitoring of vital signs, hemodynamics,respiratory and cardiac monitoring, neurological assessment, discussion with family, other specialists and medical decision making of high complexity. I spent 60 minutes of neurocritical care time  in the care of  this patient. This was time spent independent of any time provided by nurse practitioner or PA.  Roland Rack, MD [facility] [phone]  If 7pm- 7am, please page neurology on call as listed in [place].

## 2022-01-21 NOTE — Progress Notes (Incomplete)
1515: Oozing noted from groin site with blood visible on gauze dressing. Pressure applied to groin site at this time.  1519: Dr. Hyman Hopes called regarding bleeding from groin site, provider en route to assess patient.

## 2022-01-21 NOTE — Consult Note (Addendum)
[facility] Renal Consultation Note    Indication for Consultation:  Management of ESRD/hemodialysis; anemia, hypertension/volume and secondary hyperparathyroidism  SWN:IOEVOJJK, [name], MD  HPI: Cindy Osborne is a 56 y.o. female with ESRD on HD TTS at [facility]. Followed by [facility]. Her past medical history is significant for HTN, hyperlipidemia, DM, and amputated R 2nd toe. Patient presented to the ED from her outpatient HD center for further evaluation for stroke-like symptoms. A code stroke was initiated. Head CT (+) large vessel occlusion at R M1 segment. S/p mechanical thrombectomy today in IR. Seen and examined patient at bedside. Patient's mother and sister also at bedside. She denies SOB, CP, and N/V. Labs include: SrCr 9.9, BUN 44, K+ 3.7, Ca 9.6, WBC 8.0, and Hgb 11.9. Plan for HD on Monday 6/12.  Past Medical History:  Diagnosis Date   Amputated toe ([place]) 01/31/2012   Right second toe distal phalanx Right great toe   Diabetes mellitus without complication ([place])    History of diabetic ulcer of foot 04/01/2014   Hyperlipidemia    Hypertension    Past Surgical History:  Procedure Laterality Date   angioplasty politeal Left 07/2020   avg for dialysis     CHOLECYSTECTOMY     COLONOSCOPY  08/15/2011   cleared for 10 yrs   IR CT HEAD LTD  01/21/2022   IR PERCUTANEOUS ART THROMBECTOMY/INFUSION INTRACRANIAL INC DIAG ANGIO  01/21/2022   IR US GUIDE VASC ACCESS RIGHT  01/21/2022   LEG AMPUTATION BELOW KNEE Right 04/2019   LEG AMPUTATION BELOW KNEE Left 09/08/2020   TOE AMPUTATION Right 10/15/2018   Family History  Problem Relation Age of Onset   Diabetes Mother    Diabetes Sister    Social History:  reports that she has never smoked. She has never used smokeless tobacco. She reports current alcohol use. She reports that she does not use drugs. Allergies  Allergen Reactions   Augmentin [Amoxicillin-Pot Clavulanate]  Shortness Of Breath   Atorvastatin Other (See Comments)    Back pain   Pioglitazone Other (See Comments)    Edema   Peanut-Containing Drug Products Itching   Rosuvastatin Itching   Shellfish-Derived Products Rash    shrimp   Sulfa Antibiotics Rash   Prior to Admission medications   Medication Sig Start Date End Date Taking? Authorizing Provider  Acetaminophen (TYLENOL PO) Take 1,000 mg by mouth daily as needed.    [provider]  albuterol (VENTOLIN HFA) 108 (90 Base) MCG/ACT inhaler TAKE 2 PUFFS BY MOUTH EVERY 6 HOURS AS NEEDED FOR WHEEZE OR SHORTNESS OF BREATH 11/07/21   Glean Hess, MD  amitriptyline (ELAVIL) 25 MG tablet Take 1 tablet (25 mg total) by mouth at bedtime. 12/01/21   Glean Hess, MD  amLODipine (NORVASC) 10 MG tablet Take 1 tablet by mouth daily.    [provider]  ASPIRIN LOW DOSE 81 MG EC tablet TAKE 1 TABLET BY MOUTH EVERY DAY 10/28/21   Glean Hess, MD  fexofenadine (ALLEGRA) 180 MG tablet Take 180 mg by mouth daily.    [provider]  fluticasone (FLONASE) 50 MCG/ACT nasal spray Place 2 sprays into both nostrils daily. 10/26/20   Glean Hess, MD  furosemide (LASIX) 80 MG tablet Take 80 mg by mouth every morning. 12/26/20   [provider]  insulin aspart (NOVOLOG) 100 UNIT/ML FlexPen Inject 5 Units into the skin 3 (three) times daily before meals. 10/18/18 09/14/21  [provider]  Insulin Glargine (BASAGLAR KWIKPEN [place]) Inject 18 Units into the skin daily.    [provider]  levonorgestrel (MIRENA) 20 MCG/24HR IUD by Intrauterine route.    [provider]  metoprolol (TOPROL-XL) 200 MG 24 hr tablet Take 200 mg by mouth daily. 04/02/20   [provider]  montelukast (SINGULAIR) 10 MG tablet TAKE 1 TABLET BY MOUTH EVERYDAY AT BEDTIME 10/25/21   Glean Hess, MD  mupirocin ointment (BACTROBAN) 2 % mupirocin 2 % topical ointment    [provider]  pravastatin  (PRAVACHOL) 20 MG tablet TAKE 1 TABLET BY MOUTH EVERY DAY 03/26/21   Glean Hess, MD  sennosides-docusate sodium (SENOKOT-S) 8.6-50 MG tablet Take 1 tablet by mouth as needed for constipation.    [provider]  sodium bicarbonate 650 MG tablet Take by mouth 3 (three) times daily. 11/14/14   [provider]  sucroferric oxyhydroxide (VELPHORO) 500 MG chewable tablet Chew 500 mg by mouth in the morning and at bedtime.    [provider]  triamcinolone cream (KENALOG) 0.1 % Apply 1 application topically 2 (two) times daily. To rash on leg 03/03/21   Glean Hess, MD   Current Facility-Administered Medications  Medication Dose Route Frequency Provider Last Rate Last Admin    stroke: early stages of recovery book   Does not apply Once Greta Doom, MD       acetaminophen (TYLENOL) tablet 650 mg  650 mg Oral Q4H PRN Greta Doom, MD       Or   acetaminophen (TYLENOL) 160 MG/5ML solution 650 mg  650 mg Per Tube Q4H PRN Greta Doom, MD       Or   acetaminophen (TYLENOL) suppository 650 mg  650 mg Rectal Q4H PRN Greta Doom, MD       aspirin 81 MG chewable tablet            cangrelor (KENGREAL) 50 MG SOLR            ceFAZolin (ANCEF) 2-4 GM/100ML-% IVPB            clevidipine (CLEVIPREX) infusion 0.5 mg/mL  0-21 mg/hr Intravenous Continuous Glori Bickers, MD 6 mL/hr at 01/21/22 1358 3 mg/hr at 01/21/22 1358   clopidogrel (PLAVIX) 300 MG tablet            eptifibatide (INTEGRILIN) 20 MG/10ML injection            insulin aspart (novoLOG) injection 0-15 Units  0-15 Units Subcutaneous Q4H Greta Doom, MD       iohexol (OMNIPAQUE) 300 MG/ML solution 100 mL  100 mL Intra-arterial Once PRN Glori Bickers, MD       metoprolol succinate (TOPROL-XL) 24 hr tablet 200 mg  200 mg Oral Daily Agarwala, Ravi, MD       montelukast (SINGULAIR) tablet 10 mg  10 mg Oral QHS Greta Doom, MD       nitroGLYCERIN 100 mcg/mL  intra-arterial injection            pantoprazole (PROTONIX) injection 40 mg  40 mg Intravenous QHS Greta Doom, MD       pravastatin (PRAVACHOL) tablet 20 mg  20 mg Oral q1800 Agarwala, Einar Grad, MD       sodium chloride flush (NS) 0.9 % injection 3 mL  3 mL Intravenous Once Godfrey Pick, MD       ticagrelor (BRILINTA) 90 MG tablet  verapamil (ISOPTIN) 2.5 MG/ML injection            Labs: Basic Metabolic Panel: Recent Labs  Lab 01/21/22 1147 01/21/22 1154  NA 137 138  K 3.7 3.7  CL 98 97*  CO2 27  --   GLUCOSE 147* 142*  BUN 47* 44*  CREATININE 9.16* 9.90*  CALCIUM 9.6  --    Liver Function Tests: Recent Labs  Lab 01/21/22 1147  AST 12*  ALT 9  ALKPHOS 153*  BILITOT 0.6  PROT 7.2  ALBUMIN 3.5   No results for input(s): "LIPASE", "AMYLASE" in the last 168 hours. No results for input(s): "AMMONIA" in the last 168 hours. CBC: Recent Labs  Lab 01/21/22 1147 01/21/22 1154  WBC 8.0  --   NEUTROABS 4.4  --   HGB 10.9* 11.9*  HCT 35.4* 35.0*  MCV 94.4  --   PLT 181  --    Cardiac Enzymes: No results for input(s): "CKTOTAL", "CKMB", "CKMBINDEX", "TROPONINI" in the last 168 hours. CBG: Recent Labs  Lab 01/21/22 1150 01/21/22 1503  GLUCAP 151* 125*   Iron Studies: No results for input(s): "IRON", "TIBC", "TRANSFERRIN", "FERRITIN" in the last 72 hours. Studies/Results: IR PERCUTANEOUS ART THROMBECTOMY/INFUSION INTRACRANIAL INC DIAG ANGIO  Result Date: 01/21/2022 INDICATION: Patient with MCA syndrome, distal M1 occlusion, left hemiplegia EXAM: 1. EMERGENT LARGE VESSEL OCCLUSION THROMBOLYSIS (ANTERIOR CIRCULATION) 2. MECHANICAL THROMBECTOMY OF THE DISTAL M1 OCCLUSION BY THE ADAPT (A DIRECT ASPIRATION FIRST PASS TECHNIQUE) TECHNIQUE COMPARISON:  Correlation is made with a CTA of the head and neck performed on the same day earlier MEDICATIONS: Injection Ancef 2 g was administered within 1 hour of the procedure ANESTHESIA/SEDATION: General anesthesia  CONTRAST:  60 mL of Omnipaque 300 FLUOROSCOPY TIME:  Fluoroscopy Time: 10 minutes 42 seconds (581 mGy). COMPLICATIONS: None immediate. TECHNIQUE: As the patient's family was not available and patient was not in a position to understand and consent for the procedure, procedure was performed under an emergent consent. The patient was then put under general anesthesia by the Department of Anesthesiology at [facility]. The right groin was prepped and draped in the usual sterile fashion. Thereafter using modified Seldinger technique, transfemoral access into the right common femoral artery was obtained without difficulty. Over a 0.035 inch guidewire a 8 French x 25 cm pinnacle was inserted. Through this, and also over a 0.035 inch glidewire a a walrus balloon guide catheter with a 6 French neuron select catheter was advanced to the aortic arch region and selective catheterization of the right common carotid artery followed by angiogram was performed. Then, under roadmap guidance, the guide catheter was advanced into the distal intracranial internal carotid artery. Next, zoom 55 and phenom 21 microcatheter and synchro 14 microwire were utilized and under roadmap guidance, microcatheter and zoom 55 were advanced into the distal intracranial ICA. Biplane DSA cerebral angiography of the right ICA injection was performed over the head. Then, under roadmap guidance, microwire and microcatheter were navigated into the M1 segment of the right MCA. The microwire was advanced into the M2-M3 segment past the occlusion at the distal M1 segment. Next, zoom 55 was advanced with its distal end at the site of the occlusion of the distal M1. The microcatheter and wire were removed and the zoom 55 was connected to the penumbra aspiration suction system. After 3 minutes of suction, the zoom 55 catheter was removed the walrus guide catheter was disconnected at the hub, 10 mL of blood was aspirated and then DSA  cerebral angiogram  was performed. Next, repeat DSA cerebral angiogram was performed after about 10 minutes to confirm the continued patency of the right MCA and its branches. Subsequently, dynamic CT head was performed. Next, the femoral sheath was partially withdrawn with its distal end in the external iliac artery and right femoral angiogram was performed and the femoral access site was closed by using an 8 [place] Angio-Seal device. Patient was then extubated and was transferred to the ICU in stable condition. FINDINGS: Right common carotid angiogram demonstrated common, internal and external carotid arteries to be widely patent without significant stenosis. Mild atheromatous disease. Biplane DSA cerebral angiography demonstrated distal intracranial internal carotid artery to be widely patent. Anterior cerebral artery and its branches have a normal appearance. There is occlusion at the distal M1 immediately distal to the origin of the anterior temporal artery. Post aspiration thrombectomy cerebral angiogram demonstrated complete revascularization with TICI 3 results. The right middle cerebral artery and its branches have a normal appearance. The venous phase of the angiogram is unremarkable with patent superior sagittal, inferior sagittal, transverse, sigmoid sinuses. No aneurysm, av malformation or dural fistula seen. Dyna CT of the head demonstrated no intracranial bleed. IMPRESSION: Successful mechanical thrombectomy for distal right M1 occlusion of the MCA by direct aspiration first pass technique with TICI 3 results. PLAN: Patient will be admitted in the ICU for close monitoring. Systolic blood pressure goal to be at 120-140 mm Hg. Electronically Signed   By: Frazier Richards M.D.   On: 01/21/2022 15:23   IR CT Head Ltd  Result Date: 01/21/2022 INDICATION: Patient with MCA syndrome, distal M1 occlusion, left hemiplegia EXAM: 1. EMERGENT LARGE VESSEL OCCLUSION THROMBOLYSIS (ANTERIOR CIRCULATION) 2. MECHANICAL THROMBECTOMY OF  THE DISTAL M1 OCCLUSION BY THE ADAPT (A DIRECT ASPIRATION FIRST PASS TECHNIQUE) TECHNIQUE COMPARISON:  Correlation is made with a CTA of the head and neck performed on the same day earlier MEDICATIONS: Injection Ancef 2 g was administered within 1 hour of the procedure ANESTHESIA/SEDATION: General anesthesia CONTRAST:  60 mL of Omnipaque 300 FLUOROSCOPY TIME:  Fluoroscopy Time: 10 minutes 42 seconds (581 mGy). COMPLICATIONS: None immediate. TECHNIQUE: As the patient's family was not available and patient was not in a position to understand and consent for the procedure, procedure was performed under an emergent consent. The patient was then put under general anesthesia by the Department of Anesthesiology at [facility]. The right groin was prepped and draped in the usual sterile fashion. Thereafter using modified Seldinger technique, transfemoral access into the right common femoral artery was obtained without difficulty. Over a 0.035 inch guidewire a 8 French x 25 cm pinnacle was inserted. Through this, and also over a 0.035 inch glidewire a a walrus balloon guide catheter with a 6 French neuron select catheter was advanced to the aortic arch region and selective catheterization of the right common carotid artery followed by angiogram was performed. Then, under roadmap guidance, the guide catheter was advanced into the distal intracranial internal carotid artery. Next, zoom 55 and phenom 21 microcatheter and synchro 14 microwire were utilized and under roadmap guidance, microcatheter and zoom 55 were advanced into the distal intracranial ICA. Biplane DSA cerebral angiography of the right ICA injection was performed over the head. Then, under roadmap guidance, microwire and microcatheter were navigated into the M1 segment of the right MCA. The microwire was advanced into the M2-M3 segment past the occlusion at the distal M1 segment. Next, zoom 55 was advanced with its distal end at the site  of the occlusion  of the distal M1. The microcatheter and wire were removed and the zoom 55 was connected to the penumbra aspiration suction system. After 3 minutes of suction, the zoom 55 catheter was removed the walrus guide catheter was disconnected at the hub, 10 mL of blood was aspirated and then DSA cerebral angiogram was performed. Next, repeat DSA cerebral angiogram was performed after about 10 minutes to confirm the continued patency of the right MCA and its branches. Subsequently, dynamic CT head was performed. Next, the femoral sheath was partially withdrawn with its distal end in the external iliac artery and right femoral angiogram was performed and the femoral access site was closed by using an 8 [place] Angio-Seal device. Patient was then extubated and was transferred to the ICU in stable condition. FINDINGS: Right common carotid angiogram demonstrated common, internal and external carotid arteries to be widely patent without significant stenosis. Mild atheromatous disease. Biplane DSA cerebral angiography demonstrated distal intracranial internal carotid artery to be widely patent. Anterior cerebral artery and its branches have a normal appearance. There is occlusion at the distal M1 immediately distal to the origin of the anterior temporal artery. Post aspiration thrombectomy cerebral angiogram demonstrated complete revascularization with TICI 3 results. The right middle cerebral artery and its branches have a normal appearance. The venous phase of the angiogram is unremarkable with patent superior sagittal, inferior sagittal, transverse, sigmoid sinuses. No aneurysm, av malformation or dural fistula seen. Dyna CT of the head demonstrated no intracranial bleed. IMPRESSION: Successful mechanical thrombectomy for distal right M1 occlusion of the MCA by direct aspiration first pass technique with TICI 3 results. PLAN: Patient will be admitted in the ICU for close monitoring. Systolic blood pressure goal to be at 120-140  mm Hg. Electronically Signed   By: Frazier Richards M.D.   On: 01/21/2022 15:23   IR US Guide Vasc Access Right  Result Date: 01/21/2022 INDICATION: Patient with MCA syndrome, distal M1 occlusion, left hemiplegia EXAM: 1. EMERGENT LARGE VESSEL OCCLUSION THROMBOLYSIS (ANTERIOR CIRCULATION) 2. MECHANICAL THROMBECTOMY OF THE DISTAL M1 OCCLUSION BY THE ADAPT (A DIRECT ASPIRATION FIRST PASS TECHNIQUE) TECHNIQUE COMPARISON:  Correlation is made with a CTA of the head and neck performed on the same day earlier MEDICATIONS: Injection Ancef 2 g was administered within 1 hour of the procedure ANESTHESIA/SEDATION: General anesthesia CONTRAST:  60 mL of Omnipaque 300 FLUOROSCOPY TIME:  Fluoroscopy Time: 10 minutes 42 seconds (581 mGy). COMPLICATIONS: None immediate. TECHNIQUE: As the patient's family was not available and patient was not in a position to understand and consent for the procedure, procedure was performed under an emergent consent. The patient was then put under general anesthesia by the Department of Anesthesiology at [facility]. The right groin was prepped and draped in the usual sterile fashion. Thereafter using modified Seldinger technique, transfemoral access into the right common femoral artery was obtained without difficulty. Over a 0.035 inch guidewire a 8 French x 25 cm pinnacle was inserted. Through this, and also over a 0.035 inch glidewire a a walrus balloon guide catheter with a 6 French neuron select catheter was advanced to the aortic arch region and selective catheterization of the right common carotid artery followed by angiogram was performed. Then, under roadmap guidance, the guide catheter was advanced into the distal intracranial internal carotid artery. Next, zoom 55 and phenom 21 microcatheter and synchro 14 microwire were utilized and under roadmap guidance, microcatheter and zoom 55 were advanced into the distal intracranial ICA. Biplane DSA cerebral  angiography of the right ICA  injection was performed over the head. Then, under roadmap guidance, microwire and microcatheter were navigated into the M1 segment of the right MCA. The microwire was advanced into the M2-M3 segment past the occlusion at the distal M1 segment. Next, zoom 55 was advanced with its distal end at the site of the occlusion of the distal M1. The microcatheter and wire were removed and the zoom 55 was connected to the penumbra aspiration suction system. After 3 minutes of suction, the zoom 55 catheter was removed the walrus guide catheter was disconnected at the hub, 10 mL of blood was aspirated and then DSA cerebral angiogram was performed. Next, repeat DSA cerebral angiogram was performed after about 10 minutes to confirm the continued patency of the right MCA and its branches. Subsequently, dynamic CT head was performed. Next, the femoral sheath was partially withdrawn with its distal end in the external iliac artery and right femoral angiogram was performed and the femoral access site was closed by using an 8 [place] Angio-Seal device. Patient was then extubated and was transferred to the ICU in stable condition. FINDINGS: Right common carotid angiogram demonstrated common, internal and external carotid arteries to be widely patent without significant stenosis. Mild atheromatous disease. Biplane DSA cerebral angiography demonstrated distal intracranial internal carotid artery to be widely patent. Anterior cerebral artery and its branches have a normal appearance. There is occlusion at the distal M1 immediately distal to the origin of the anterior temporal artery. Post aspiration thrombectomy cerebral angiogram demonstrated complete revascularization with TICI 3 results. The right middle cerebral artery and its branches have a normal appearance. The venous phase of the angiogram is unremarkable with patent superior sagittal, inferior sagittal, transverse, sigmoid sinuses. No aneurysm, av malformation or dural fistula  seen. Dyna CT of the head demonstrated no intracranial bleed. IMPRESSION: Successful mechanical thrombectomy for distal right M1 occlusion of the MCA by direct aspiration first pass technique with TICI 3 results. PLAN: Patient will be admitted in the ICU for close monitoring. Systolic blood pressure goal to be at 120-140 mm Hg. Electronically Signed   By: Frazier Richards M.D.   On: 01/21/2022 15:23   CT ANGIO HEAD NECK W WO CM (CODE STROKE)  Result Date: 01/21/2022 CLINICAL DATA:  Acute stroke suspected EXAM: CT ANGIOGRAPHY HEAD AND NECK TECHNIQUE: Multidetector CT imaging of the head and neck was performed using the standard protocol during bolus administration of intravenous contrast. Multiplanar CT image reconstructions and MIPs were obtained to evaluate the vascular anatomy. Carotid stenosis measurements (when applicable) are obtained utilizing NASCET criteria, using the distal internal carotid diameter as the denominator. RADIATION DOSE REDUCTION: This exam was performed according to the departmental dose-optimization program which includes automated exposure control, adjustment of the mA and/or kV according to patient size and/or use of iterative reconstruction technique. CONTRAST:  44mL OMNIPAQUE IOHEXOL 350 MG/ML SOLN COMPARISON:  None Available. FINDINGS: CTA NECK FINDINGS Aortic arch: Atheromatous plaque.  Three vessel branching. Right carotid system: Diffuse atheromatous plaque, primarily calcified. No flow limiting stenosis or ulceration of the common and internal carotid arteries. ECA origin stenosis. Left carotid system: Calcified atheromatous plaque without stenosis or ulceration. Vertebral arteries: No proximal subclavian stenosis. Calcified plaque at the right vertebral origin. No flow limiting stenosis, beading, or dissection. Skeleton: Renal osteodystrophy findings at the medial clavicular heads. Other neck: 13 mm left thyroid nodule. No followup recommended (ref: J Am Coll Radiol. 2015  Feb;12(2): 143-50). Upper chest: At least moderate volume layering right  pleural effusion. Review of the MIP images confirms the above findings CTA HEAD FINDINGS Anterior circulation: Abrupt cut off of the right MCA just beyond the anterior temporal branch. Partial downstream reconstitution. No left-sided or anterior cerebral embolic disease is seen. Confluent calcified plaque on the carotid siphons Posterior circulation: The vertebral and basilar arteries are smoothly contoured and diffusely patent. No branch occlusion, beading, or aneurysm. Venous sinuses: Unremarkable for the arterial phase Anatomic variants: None significant Review of the MIP images confirms the above findings Critical Value/emergent results were called by telephone at the time of interpretation on 01/21/2022 at 12:05 pm to provider [facility] , who verbally acknowledged these results. IMPRESSION: 1. Emergent large vessel occlusion at the right M1 segment, just beyond the anterior temporal branch. 2. Atherosclerosis without flow limiting stenosis or embolic source seen in the more proximal neck. 3. Layering right pleural effusion. Electronically Signed   By: Jorje Guild M.D.   On: 01/21/2022 12:10   CT HEAD CODE STROKE WO CONTRAST  Result Date: 01/21/2022 CLINICAL DATA:  Code stroke.  Left-sided weakness EXAM: CT HEAD WITHOUT CONTRAST TECHNIQUE: Contiguous axial images were obtained from the base of the skull through the vertex without intravenous contrast. RADIATION DOSE REDUCTION: This exam was performed according to the departmental dose-optimization program which includes automated exposure control, adjustment of the mA and/or kV according to patient size and/or use of iterative reconstruction technique. COMPARISON:  None Available. FINDINGS: Brain: No hemorrhage or convincing acute infarction. Remote left occipital parietal cortex infarct. No hydrocephalus or masslike finding Vascular: Only seen on reformats (due to slice  spacing) there is a dense appearance at the distal right MCA Skull: Heterogeneous calvarial density diffusely, likely renal osteodystrophy. Sinuses/Orbits: No acute finding Other: These results were communicated to Dr. Leonel Ramsay at 11:55 am on 01/21/2022 by text page via the [facility] messaging system. ASPECTS [facility] Stroke Program Early CT Score) -when compared to gray-white differentiation on the left - Ganglionic level infarction (caudate, lentiform nuclei, internal capsule, insula, M1-M3 cortex): 7 - Supraganglionic infarction (M4-M6 cortex): 3 Total score (0-10 with 10 being normal): 10 IMPRESSION: 1. Possible dense right MCA. 2. No intracranial hemorrhage.  ASPECTS is 10. 3. Remote left occipital parietal cortex infarction. Electronically Signed   By: Jorje Guild M.D.   On: 01/21/2022 11:57     Physical Exam: Vitals:   01/21/22 1500 01/21/22 1515 01/21/22 1530 01/21/22 1545  BP: 132/72 129/77 133/74 126/76  Pulse: 80 80 80 80  Resp: 17 (!) 21 19 15   Temp:      TempSrc:      SpO2: 100% 100% 95% 92%  Weight:         General: Awake and alert; NAD Head: sclera anicteric  Lungs: Clear anteriorly and laterally; No wheeze, rales or rhonchi. Breathing is unlabored. Heart: RRR. No murmur, rubs or gallops.  Abdomen: large, soft, nontender Lower extremities: Bilateral BKA; no edema noted at stumps. Noted bedside RN applying pressure to R groin (s/p mechanical thrmobectomy) for bleeding-IR to come and assess. Neuro: AAOx3. Moves all extremities spontaneously. Psych:  Responds to questions appropriately with a normal affect. Dialysis Access: AVF (+) B/T  Dialysis Orders:  [facility] - HD Rx currently being faxed over to [place] here.   Assessment/Plan: Arterial Ischemic CVA/MCA-s/p mechanical thrombectomy today in IR; on 2nd CVA prevention: ASA, Brillinta, statin; ECHO pending; MRI and swallow eval ordered; followed by critical care team ESRD  - on HD TTS; plan for  HD on 6/12. If remains in ICU, HD will need to be done at bedside. Hypertension/volume-patient euvolemic on exam and Bps stable Anemia of CKD - Hgb 11.9; no indication for ESA or Fe at this time 5.   Secondary Hyperparathyroidism - Corr Ca 10; will obtain PO4 in     AM 6.   Nutrition - Currently NPO; advance to renal diet once clinically stable  Tobie Poet, NP [employer] 01/21/2022, 3:52 PM    I have seen and examined this patient and agree with plan and assessment in the above note with renal recommendations/intervention highlighted. Pt went to HD today but became confused and "lost my balance" and was  sent to [facility].  CT showed acute right M1 stroke underwent mechanical thrombectomy.  We were asked to provide dialysis during her hospitalization.  No evidence of volume overload or electrolyte abnormalities. Will plan for HD either tomorrow or Monday given high hospital census.   Governor Rooks Burdette Forehand,MD 01/21/2022 4:29 PM

## 2022-01-21 NOTE — Anesthesia Preprocedure Evaluation (Addendum)
Anesthesia Evaluation  Patient identified by MRN, date of birth, ID band Patient confused  Preop documentation limited or incomplete due to emergent nature of procedure.  Airway Mallampati: II  TM Distance: >3 FB Neck ROM: Full    Dental  (+) Poor Dentition   Pulmonary sleep apnea ,    Pulmonary exam normal        Cardiovascular hypertension, Pt. on medications and Pt. on home beta blockers + Peripheral Vascular Disease   Rhythm:Regular Rate:Normal     Neuro/Psych Anxiety CODE STROKE CVA (left arm weakness), Residual Symptoms    GI/Hepatic negative GI ROS,   Endo/Other  diabetes, Poorly Controlled, Type 2  Renal/GU CRF and DialysisRenal disease  negative genitourinary   Musculoskeletal  (+) Arthritis , Osteoarthritis,    Abdominal Normal abdominal exam  (+)   Peds  Hematology  (+) Blood dyscrasia, anemia ,   Anesthesia Other Findings   Reproductive/Obstetrics                           Anesthesia Physical Anesthesia Plan  ASA: 4 and emergent  Anesthesia Plan: General   Post-op Pain Management:    Induction: Intravenous and Rapid sequence  PONV Risk Score and Plan: 3 and Ondansetron and Treatment may vary due to age or medical condition  Airway Management Planned: Mask and Oral ETT  Additional Equipment: Arterial line  Intra-op Plan:   Post-operative Plan: Possible Post-op intubation/ventilation  Informed Consent:     Only emergency history available  Plan Discussed with: CRNA  Anesthesia Plan Comments: (Lab Results      Component                Value               Date                      WBC                      8.0                 01/21/2022                HGB                      11.9 (L)            01/21/2022                HCT                      35.0 (L)            01/21/2022                MCV                      94.4                01/21/2022                 PLT                      181                 01/21/2022           Lab Results  Component                Value               Date                      NA                       138                 01/21/2022                K                        3.7                 01/21/2022                CO2                      27                  01/21/2022                GLUCOSE                  142 (H)             01/21/2022                BUN                      44 (H)              01/21/2022                CREATININE               9.90 (H)            01/21/2022                CALCIUM                  9.6                 01/21/2022                GFRNONAA                 5 (L)               01/21/2022          )        Anesthesia Quick Evaluation

## 2022-01-21 NOTE — Assessment & Plan Note (Signed)
Currently requiring Cleviprex to maintain goal BP 130-150 following intervention.   - Restart oral medications once she is cleared to swallow.

## 2022-01-21 NOTE — Progress Notes (Addendum)
PHARMACIST CODE STROKE RESPONSE  Notified to mix TNK at 1152 by Dr. Leonel Ramsay Delivered TNK to RN at 1153  TNK dose = 25 mg IV over 5 seconds.   Issues/delays encountered (if applicable): IV access, administered [place] 01/21/22 11:57 AM

## 2022-01-21 NOTE — Anesthesia Procedure Notes (Signed)
Procedure Name: Intubation Date/Time: 01/21/2022 12:50 PM  Performed by: Dorann Lodge, CRNAPre-anesthesia Checklist: Patient identified, Emergency Drugs available, Suction available and Patient being monitored Patient Re-evaluated:Patient Re-evaluated prior to induction Oxygen Delivery Method: Circle System Utilized Preoxygenation: Pre-oxygenation with 100% oxygen Induction Type: IV induction and Rapid sequence Laryngoscope Size: Mac and 4 Grade View: Grade I Tube type: Oral Tube size: 7.0 mm Number of attempts: 1 Airway Equipment and Method: Stylet Placement Confirmation: ETT inserted through vocal cords under direct vision, positive ETCO2 and breath sounds checked- equal and bilateral Secured at: 22 cm Tube secured with: Tape Dental Injury: Teeth and Oropharynx as per pre-operative assessment

## 2022-01-21 NOTE — ED Triage Notes (Signed)
Pt BIB [facility] from a dialysis center as a code stroke. Pt has extensive past medical history .Per EMS, pt was in the waiting room of the dialysis center, and all of sudden pt started exhibiting  L side weakness , slurred speech and L side negligence. Pt has bilateral BKA. A&O x4. Pt's LKW was 1050 am this morning.   Pt arrived to the ED at 1144.  Arrived in CT at 1145.  TNK was given at 1156.  Pt went to the IR directly from CT , did not stop by ED.

## 2022-01-21 NOTE — Transfer of Care (Signed)
Immediate Anesthesia Transfer of Care Note  Patient: Cindy Osborne  Procedure(s) Performed: IR WITH ANESTHESIA  Patient Location: ICU  Anesthesia Type:General  Level of Consciousness: drowsy  Airway & Oxygen Therapy: Patient Spontanous Breathing and Patient connected to face mask oxygen  Post-op Assessment: Report given to RN and Post -op Vital signs reviewed and stable  Post vital signs: Reviewed and stable  Last Vitals:  Vitals Value Taken Time  BP 124/78 01/21/22 1420  Temp    Pulse 81 01/21/22 1421  Resp 19 01/21/22 1422  SpO2 94 % 01/21/22 1421  Vitals shown include unvalidated device data.  Last Pain: There were no vitals filed for this visit.       Complications: No notable events documented.

## 2022-01-21 NOTE — ED Notes (Signed)
Temperature was not obtained as the Pt went to the IR emergently from the CT.

## 2022-01-21 NOTE — ED Provider Notes (Signed)
[facility] EMERGENCY DEPARTMENT Provider Note   CSN: [id number] Arrival date & time: 01/21/22  1144  An emergency department physician performed an initial assessment on this suspected stroke patient at 1145.  History  Chief Complaint  Patient presents with   Code Stroke    Cindy Osborne is a 56 y.o. female.  HPI Patient presents for strokelike symptoms.  Her medical history includes ESRD on HD, DM, HLD, HTN.  Patient was at her dialysis session this morning.  While undergoing dialysis, she had left-sided weakness and slurred speech.  Time of onset was 10:50 AM.  Patient has had ongoing symptoms since that time.  Patient was normotensive with normal blood glucose prior to arrival.  Patient denies any current blood thinner use.    Home Medications Prior to Admission medications   Medication Sig Start Date End Date Taking? Authorizing Provider  Acetaminophen (TYLENOL PO) Take 1,000 mg by mouth daily as needed.    [provider]  albuterol (VENTOLIN HFA) 108 (90 Base) MCG/ACT inhaler TAKE 2 PUFFS BY MOUTH EVERY 6 HOURS AS NEEDED FOR WHEEZE OR SHORTNESS OF BREATH 11/07/21   Glean Hess, MD  amitriptyline (ELAVIL) 25 MG tablet Take 1 tablet (25 mg total) by mouth at bedtime. 12/01/21   Glean Hess, MD  amLODipine (NORVASC) 10 MG tablet Take 1 tablet by mouth daily.    [provider]  ASPIRIN LOW DOSE 81 MG EC tablet TAKE 1 TABLET BY MOUTH EVERY DAY 10/28/21   Glean Hess, MD  fexofenadine (ALLEGRA) 180 MG tablet Take 180 mg by mouth daily.    [provider]  fluticasone (FLONASE) 50 MCG/ACT nasal spray Place 2 sprays into both nostrils daily. 10/26/20   Glean Hess, MD  furosemide (LASIX) 80 MG tablet Take 80 mg by mouth every morning. 12/26/20   [provider]  insulin aspart (NOVOLOG) 100 UNIT/ML FlexPen Inject 5 Units into the skin 3 (three) times daily before meals. 10/18/18 09/14/21  [provider]   Insulin Glargine (BASAGLAR KWIKPEN [place]) Inject 18 Units into the skin daily.    [provider]  levonorgestrel (MIRENA) 20 MCG/24HR IUD by Intrauterine route.    [provider]  metoprolol (TOPROL-XL) 200 MG 24 hr tablet Take 200 mg by mouth daily. 04/02/20   [provider]  montelukast (SINGULAIR) 10 MG tablet TAKE 1 TABLET BY MOUTH EVERYDAY AT BEDTIME 10/25/21   Glean Hess, MD  mupirocin ointment (BACTROBAN) 2 % mupirocin 2 % topical ointment    [provider]  pravastatin (PRAVACHOL) 20 MG tablet TAKE 1 TABLET BY MOUTH EVERY DAY 03/26/21   Glean Hess, MD  sennosides-docusate sodium (SENOKOT-S) 8.6-50 MG tablet Take 1 tablet by mouth as needed for constipation.    [provider]  sodium bicarbonate 650 MG tablet Take by mouth 3 (three) times daily. 11/14/14   [provider]  sucroferric oxyhydroxide (VELPHORO) 500 MG chewable tablet Chew 500 mg by mouth in the morning and at bedtime.    [provider]  triamcinolone cream (KENALOG) 0.1 % Apply 1 application topically 2 (two) times daily. To rash on leg 03/03/21   Glean Hess, MD      Allergies    Augmentin [amoxicillin-pot clavulanate], Atorvastatin, Pioglitazone, Peanut-containing drug products, Rosuvastatin, Shellfish-derived products, and Sulfa antibiotics    Review of Systems   Review of Systems  Neurological:  Positive for facial asymmetry, speech difficulty, weakness and numbness.  All other  systems reviewed and are negative.   Physical Exam Updated Vital Signs BP 129/75   Pulse 80   Temp 97.7 F (36.5 C) (Axillary)   Resp 18   Wt 111.6 kg   SpO2 94%   BMI 36.33 kg/m  Physical Exam Vitals and nursing note reviewed.  Constitutional:      General: She is not in acute distress.    Appearance: She is well-developed. She is not toxic-appearing or diaphoretic.  HENT:     Head: Normocephalic and atraumatic.     Right Ear: External ear normal.      Left Ear: External ear normal.     Nose: Nose normal.     Mouth/Throat:     Mouth: Mucous membranes are moist.     Pharynx: Oropharynx is clear.  Eyes:     General: Visual field deficit present.     Extraocular Movements: Extraocular movements intact.     Conjunctiva/sclera: Conjunctivae normal.  Cardiovascular:     Rate and Rhythm: Normal rate and regular rhythm.     Heart sounds: No murmur heard. Pulmonary:     Effort: Pulmonary effort is normal. No respiratory distress.  Abdominal:     General: There is no distension.     Palpations: Abdomen is soft.  Musculoskeletal:        General: No swelling or deformity.     Cervical back: Neck supple. No rigidity.  Skin:    General: Skin is warm and dry.  Neurological:     Mental Status: She is alert and oriented to person, place, and time.     Cranial Nerves: Dysarthria and facial asymmetry present.     Motor: Weakness (Left hemibody) present.  Psychiatric:        Mood and Affect: Mood normal.        Behavior: Behavior normal.     ED Results / Procedures / Treatments   Labs (all labs ordered are listed, but only abnormal results are displayed) Labs Reviewed  CBC - Abnormal; Notable for the following components:      Result Value   RBC 3.75 (*)    Hemoglobin 10.9 (*)    HCT 35.4 (*)    All other components within normal limits  COMPREHENSIVE METABOLIC PANEL - Abnormal; Notable for the following components:   Glucose, Bld 147 (*)    BUN 47 (*)    Creatinine, Ser 9.16 (*)    AST 12 (*)    Alkaline Phosphatase 153 (*)    GFR, Estimated 5 (*)    All other components within normal limits  GLUCOSE, CAPILLARY - Abnormal; Notable for the following components:   Glucose-Capillary 125 (*)    All other components within normal limits  I-STAT CHEM 8, ED - Abnormal; Notable for the following components:   Chloride 97 (*)    BUN 44 (*)    Creatinine, Ser 9.90 (*)    Glucose, Bld 142 (*)    Calcium, Ion 1.07 (*)    Hemoglobin  11.9 (*)    HCT 35.0 (*)    All other components within normal limits  CBG MONITORING, ED - Abnormal; Notable for the following components:   Glucose-Capillary 151 (*)    All other components within normal limits  I-STAT BETA HCG BLOOD, ED ([facility], [facility], [facility] ONLY) - Abnormal; Notable for the following components:   I-stat hCG, quantitative 11.0 (*)    All other components within normal limits  PROTIME-INR  APTT  DIFFERENTIAL  RAPID URINE  DRUG SCREEN, HOSP PERFORMED  HIV ANTIBODY (ROUTINE TESTING W REFLEX)  LIPID PANEL    EKG None  Radiology IR PERCUTANEOUS ART THROMBECTOMY/INFUSION INTRACRANIAL INC DIAG ANGIO  Result Date: 01/21/2022 INDICATION: Patient with MCA syndrome, distal M1 occlusion, left hemiplegia EXAM: 1. EMERGENT LARGE VESSEL OCCLUSION THROMBOLYSIS (ANTERIOR CIRCULATION) 2. MECHANICAL THROMBECTOMY OF THE DISTAL M1 OCCLUSION BY THE ADAPT (A DIRECT ASPIRATION FIRST PASS TECHNIQUE) TECHNIQUE COMPARISON:  Correlation is made with a CTA of the head and neck performed on the same day earlier MEDICATIONS: Injection Ancef 2 g was administered within 1 hour of the procedure ANESTHESIA/SEDATION: General anesthesia CONTRAST:  60 mL of Omnipaque 300 FLUOROSCOPY TIME:  Fluoroscopy Time: 10 minutes 42 seconds (581 mGy). COMPLICATIONS: None immediate. TECHNIQUE: As the patient's family was not available and patient was not in a position to understand and consent for the procedure, procedure was performed under an emergent consent. The patient was then put under general anesthesia by the Department of Anesthesiology at [facility]. The right groin was prepped and draped in the usual sterile fashion. Thereafter using modified Seldinger technique, transfemoral access into the right common femoral artery was obtained without difficulty. Over a 0.035 inch guidewire a 8 French x 25 cm pinnacle was inserted. Through this, and also over a 0.035 inch glidewire a a walrus balloon guide catheter with a 6  French neuron select catheter was advanced to the aortic arch region and selective catheterization of the right common carotid artery followed by angiogram was performed. Then, under roadmap guidance, the guide catheter was advanced into the distal intracranial internal carotid artery. Next, zoom 55 and phenom 21 microcatheter and synchro 14 microwire were utilized and under roadmap guidance, microcatheter and zoom 55 were advanced into the distal intracranial ICA. Biplane DSA cerebral angiography of the right ICA injection was performed over the head. Then, under roadmap guidance, microwire and microcatheter were navigated into the M1 segment of the right MCA. The microwire was advanced into the M2-M3 segment past the occlusion at the distal M1 segment. Next, zoom 55 was advanced with its distal end at the site of the occlusion of the distal M1. The microcatheter and wire were removed and the zoom 55 was connected to the penumbra aspiration suction system. After 3 minutes of suction, the zoom 55 catheter was removed the walrus guide catheter was disconnected at the hub, 10 mL of blood was aspirated and then DSA cerebral angiogram was performed. Next, repeat DSA cerebral angiogram was performed after about 10 minutes to confirm the continued patency of the right MCA and its branches. Subsequently, dynamic CT head was performed. Next, the femoral sheath was partially withdrawn with its distal end in the external iliac artery and right femoral angiogram was performed and the femoral access site was closed by using an 8 [place] Angio-Seal device. Patient was then extubated and was transferred to the ICU in stable condition. FINDINGS: Right common carotid angiogram demonstrated common, internal and external carotid arteries to be widely patent without significant stenosis. Mild atheromatous disease. Biplane DSA cerebral angiography demonstrated distal intracranial internal carotid artery to be widely patent. Anterior  cerebral artery and its branches have a normal appearance. There is occlusion at the distal M1 immediately distal to the origin of the anterior temporal artery. Post aspiration thrombectomy cerebral angiogram demonstrated complete revascularization with TICI 3 results. The right middle cerebral artery and its branches have a normal appearance. The venous phase of the angiogram is unremarkable with patent superior sagittal, inferior sagittal,  transverse, sigmoid sinuses. No aneurysm, av malformation or dural fistula seen. Dyna CT of the head demonstrated no intracranial bleed. IMPRESSION: Successful mechanical thrombectomy for distal right M1 occlusion of the MCA by direct aspiration first pass technique with TICI 3 results. PLAN: Patient will be admitted in the ICU for close monitoring. Systolic blood pressure goal to be at 120-140 mm Hg. Electronically Signed   By: Frazier Richards M.D.   On: 01/21/2022 15:23   IR CT Head Ltd  Result Date: 01/21/2022 INDICATION: Patient with MCA syndrome, distal M1 occlusion, left hemiplegia EXAM: 1. EMERGENT LARGE VESSEL OCCLUSION THROMBOLYSIS (ANTERIOR CIRCULATION) 2. MECHANICAL THROMBECTOMY OF THE DISTAL M1 OCCLUSION BY THE ADAPT (A DIRECT ASPIRATION FIRST PASS TECHNIQUE) TECHNIQUE COMPARISON:  Correlation is made with a CTA of the head and neck performed on the same day earlier MEDICATIONS: Injection Ancef 2 g was administered within 1 hour of the procedure ANESTHESIA/SEDATION: General anesthesia CONTRAST:  60 mL of Omnipaque 300 FLUOROSCOPY TIME:  Fluoroscopy Time: 10 minutes 42 seconds (581 mGy). COMPLICATIONS: None immediate. TECHNIQUE: As the patient's family was not available and patient was not in a position to understand and consent for the procedure, procedure was performed under an emergent consent. The patient was then put under general anesthesia by the Department of Anesthesiology at [facility]. The right groin was prepped and draped in the usual sterile  fashion. Thereafter using modified Seldinger technique, transfemoral access into the right common femoral artery was obtained without difficulty. Over a 0.035 inch guidewire a 8 French x 25 cm pinnacle was inserted. Through this, and also over a 0.035 inch glidewire a a walrus balloon guide catheter with a 6 French neuron select catheter was advanced to the aortic arch region and selective catheterization of the right common carotid artery followed by angiogram was performed. Then, under roadmap guidance, the guide catheter was advanced into the distal intracranial internal carotid artery. Next, zoom 55 and phenom 21 microcatheter and synchro 14 microwire were utilized and under roadmap guidance, microcatheter and zoom 55 were advanced into the distal intracranial ICA. Biplane DSA cerebral angiography of the right ICA injection was performed over the head. Then, under roadmap guidance, microwire and microcatheter were navigated into the M1 segment of the right MCA. The microwire was advanced into the M2-M3 segment past the occlusion at the distal M1 segment. Next, zoom 55 was advanced with its distal end at the site of the occlusion of the distal M1. The microcatheter and wire were removed and the zoom 55 was connected to the penumbra aspiration suction system. After 3 minutes of suction, the zoom 55 catheter was removed the walrus guide catheter was disconnected at the hub, 10 mL of blood was aspirated and then DSA cerebral angiogram was performed. Next, repeat DSA cerebral angiogram was performed after about 10 minutes to confirm the continued patency of the right MCA and its branches. Subsequently, dynamic CT head was performed. Next, the femoral sheath was partially withdrawn with its distal end in the external iliac artery and right femoral angiogram was performed and the femoral access site was closed by using an 8 [place] Angio-Seal device. Patient was then extubated and was transferred to the ICU in stable  condition. FINDINGS: Right common carotid angiogram demonstrated common, internal and external carotid arteries to be widely patent without significant stenosis. Mild atheromatous disease. Biplane DSA cerebral angiography demonstrated distal intracranial internal carotid artery to be widely patent. Anterior cerebral artery and its branches have a normal appearance. There  is occlusion at the distal M1 immediately distal to the origin of the anterior temporal artery. Post aspiration thrombectomy cerebral angiogram demonstrated complete revascularization with TICI 3 results. The right middle cerebral artery and its branches have a normal appearance. The venous phase of the angiogram is unremarkable with patent superior sagittal, inferior sagittal, transverse, sigmoid sinuses. No aneurysm, av malformation or dural fistula seen. Dyna CT of the head demonstrated no intracranial bleed. IMPRESSION: Successful mechanical thrombectomy for distal right M1 occlusion of the MCA by direct aspiration first pass technique with TICI 3 results. PLAN: Patient will be admitted in the ICU for close monitoring. Systolic blood pressure goal to be at 120-140 mm Hg. Electronically Signed   By: Frazier Richards M.D.   On: 01/21/2022 15:23   IR US Guide Vasc Access Right  Result Date: 01/21/2022 INDICATION: Patient with MCA syndrome, distal M1 occlusion, left hemiplegia EXAM: 1. EMERGENT LARGE VESSEL OCCLUSION THROMBOLYSIS (ANTERIOR CIRCULATION) 2. MECHANICAL THROMBECTOMY OF THE DISTAL M1 OCCLUSION BY THE ADAPT (A DIRECT ASPIRATION FIRST PASS TECHNIQUE) TECHNIQUE COMPARISON:  Correlation is made with a CTA of the head and neck performed on the same day earlier MEDICATIONS: Injection Ancef 2 g was administered within 1 hour of the procedure ANESTHESIA/SEDATION: General anesthesia CONTRAST:  60 mL of Omnipaque 300 FLUOROSCOPY TIME:  Fluoroscopy Time: 10 minutes 42 seconds (581 mGy). COMPLICATIONS: None immediate. TECHNIQUE: As the patient's  family was not available and patient was not in a position to understand and consent for the procedure, procedure was performed under an emergent consent. The patient was then put under general anesthesia by the Department of Anesthesiology at [facility]. The right groin was prepped and draped in the usual sterile fashion. Thereafter using modified Seldinger technique, transfemoral access into the right common femoral artery was obtained without difficulty. Over a 0.035 inch guidewire a 8 French x 25 cm pinnacle was inserted. Through this, and also over a 0.035 inch glidewire a a walrus balloon guide catheter with a 6 French neuron select catheter was advanced to the aortic arch region and selective catheterization of the right common carotid artery followed by angiogram was performed. Then, under roadmap guidance, the guide catheter was advanced into the distal intracranial internal carotid artery. Next, zoom 55 and phenom 21 microcatheter and synchro 14 microwire were utilized and under roadmap guidance, microcatheter and zoom 55 were advanced into the distal intracranial ICA. Biplane DSA cerebral angiography of the right ICA injection was performed over the head. Then, under roadmap guidance, microwire and microcatheter were navigated into the M1 segment of the right MCA. The microwire was advanced into the M2-M3 segment past the occlusion at the distal M1 segment. Next, zoom 55 was advanced with its distal end at the site of the occlusion of the distal M1. The microcatheter and wire were removed and the zoom 55 was connected to the penumbra aspiration suction system. After 3 minutes of suction, the zoom 55 catheter was removed the walrus guide catheter was disconnected at the hub, 10 mL of blood was aspirated and then DSA cerebral angiogram was performed. Next, repeat DSA cerebral angiogram was performed after about 10 minutes to confirm the continued patency of the right MCA and its branches.  Subsequently, dynamic CT head was performed. Next, the femoral sheath was partially withdrawn with its distal end in the external iliac artery and right femoral angiogram was performed and the femoral access site was closed by using an 8 [place] Angio-Seal device. Patient was then extubated  and was transferred to the ICU in stable condition. FINDINGS: Right common carotid angiogram demonstrated common, internal and external carotid arteries to be widely patent without significant stenosis. Mild atheromatous disease. Biplane DSA cerebral angiography demonstrated distal intracranial internal carotid artery to be widely patent. Anterior cerebral artery and its branches have a normal appearance. There is occlusion at the distal M1 immediately distal to the origin of the anterior temporal artery. Post aspiration thrombectomy cerebral angiogram demonstrated complete revascularization with TICI 3 results. The right middle cerebral artery and its branches have a normal appearance. The venous phase of the angiogram is unremarkable with patent superior sagittal, inferior sagittal, transverse, sigmoid sinuses. No aneurysm, av malformation or dural fistula seen. Dyna CT of the head demonstrated no intracranial bleed. IMPRESSION: Successful mechanical thrombectomy for distal right M1 occlusion of the MCA by direct aspiration first pass technique with TICI 3 results. PLAN: Patient will be admitted in the ICU for close monitoring. Systolic blood pressure goal to be at 120-140 mm Hg. Electronically Signed   By: Frazier Richards M.D.   On: 01/21/2022 15:23   CT ANGIO HEAD NECK W WO CM (CODE STROKE)  Result Date: 01/21/2022 CLINICAL DATA:  Acute stroke suspected EXAM: CT ANGIOGRAPHY HEAD AND NECK TECHNIQUE: Multidetector CT imaging of the head and neck was performed using the standard protocol during bolus administration of intravenous contrast. Multiplanar CT image reconstructions and MIPs were obtained to evaluate the vascular  anatomy. Carotid stenosis measurements (when applicable) are obtained utilizing NASCET criteria, using the distal internal carotid diameter as the denominator. RADIATION DOSE REDUCTION: This exam was performed according to the departmental dose-optimization program which includes automated exposure control, adjustment of the mA and/or kV according to patient size and/or use of iterative reconstruction technique. CONTRAST:  45mL OMNIPAQUE IOHEXOL 350 MG/ML SOLN COMPARISON:  None Available. FINDINGS: CTA NECK FINDINGS Aortic arch: Atheromatous plaque.  Three vessel branching. Right carotid system: Diffuse atheromatous plaque, primarily calcified. No flow limiting stenosis or ulceration of the common and internal carotid arteries. ECA origin stenosis. Left carotid system: Calcified atheromatous plaque without stenosis or ulceration. Vertebral arteries: No proximal subclavian stenosis. Calcified plaque at the right vertebral origin. No flow limiting stenosis, beading, or dissection. Skeleton: Renal osteodystrophy findings at the medial clavicular heads. Other neck: 13 mm left thyroid nodule. No followup recommended (ref: J Am Coll Radiol. 2015 Feb;12(2): 143-50). Upper chest: At least moderate volume layering right pleural effusion. Review of the MIP images confirms the above findings CTA HEAD FINDINGS Anterior circulation: Abrupt cut off of the right MCA just beyond the anterior temporal branch. Partial downstream reconstitution. No left-sided or anterior cerebral embolic disease is seen. Confluent calcified plaque on the carotid siphons Posterior circulation: The vertebral and basilar arteries are smoothly contoured and diffusely patent. No branch occlusion, beading, or aneurysm. Venous sinuses: Unremarkable for the arterial phase Anatomic variants: None significant Review of the MIP images confirms the above findings Critical Value/emergent results were called by telephone at the time of interpretation on 01/21/2022  at 12:05 pm to provider [facility] , who verbally acknowledged these results. IMPRESSION: 1. Emergent large vessel occlusion at the right M1 segment, just beyond the anterior temporal branch. 2. Atherosclerosis without flow limiting stenosis or embolic source seen in the more proximal neck. 3. Layering right pleural effusion. Electronically Signed   By: Jorje Guild M.D.   On: 01/21/2022 12:10   CT HEAD CODE STROKE WO CONTRAST  Result Date: 01/21/2022 CLINICAL DATA:  Code stroke.  Left-sided  weakness EXAM: CT HEAD WITHOUT CONTRAST TECHNIQUE: Contiguous axial images were obtained from the base of the skull through the vertex without intravenous contrast. RADIATION DOSE REDUCTION: This exam was performed according to the departmental dose-optimization program which includes automated exposure control, adjustment of the mA and/or kV according to patient size and/or use of iterative reconstruction technique. COMPARISON:  None Available. FINDINGS: Brain: No hemorrhage or convincing acute infarction. Remote left occipital parietal cortex infarct. No hydrocephalus or masslike finding Vascular: Only seen on reformats (due to slice spacing) there is a dense appearance at the distal right MCA Skull: Heterogeneous calvarial density diffusely, likely renal osteodystrophy. Sinuses/Orbits: No acute finding Other: These results were communicated to Dr. Leonel Ramsay at 11:55 am on 01/21/2022 by text page via the [facility] messaging system. ASPECTS [facility] Stroke Program Early CT Score) -when compared to gray-white differentiation on the left - Ganglionic level infarction (caudate, lentiform nuclei, internal capsule, insula, M1-M3 cortex): 7 - Supraganglionic infarction (M4-M6 cortex): 3 Total score (0-10 with 10 being normal): 10 IMPRESSION: 1. Possible dense right MCA. 2. No intracranial hemorrhage.  ASPECTS is 10. 3. Remote left occipital parietal cortex infarction. Electronically Signed   By: Jorje Guild M.D.    On: 01/21/2022 11:57    Procedures Procedures    Medications Ordered in ED Medications  sodium chloride flush (NS) 0.9 % injection 3 mL ( Intravenous MAR Unhold 01/21/22 1429)   stroke: early stages of recovery book ( Does not apply MAR Unhold 01/21/22 1429)  acetaminophen (TYLENOL) tablet 650 mg ( Oral MAR Unhold 01/21/22 1429)    Or  acetaminophen (TYLENOL) 160 MG/5ML solution 650 mg ( Per Tube MAR Unhold 01/21/22 1429)    Or  acetaminophen (TYLENOL) suppository 650 mg ( Rectal MAR Unhold 01/21/22 1429)  pantoprazole (PROTONIX) injection 40 mg ( Intravenous MAR Unhold 01/21/22 1429)  ceFAZolin (ANCEF) 2-4 GM/100ML-% IVPB (has no administration in time range)  nitroGLYCERIN 100 mcg/mL intra-arterial injection (has no administration in time range)  aspirin 81 MG chewable tablet (has no administration in time range)  verapamil (ISOPTIN) 2.5 MG/ML injection (has no administration in time range)  ticagrelor (BRILINTA) 90 MG tablet (has no administration in time range)  clopidogrel (PLAVIX) 300 MG tablet (has no administration in time range)  cangrelor (KENGREAL) 50 MG SOLR (has no administration in time range)  eptifibatide (INTEGRILIN) 20 MG/10ML injection (has no administration in time range)  montelukast (SINGULAIR) tablet 10 mg ( Oral MAR Unhold 01/21/22 1429)  insulin aspart (novoLOG) injection 0-15 Units ( Subcutaneous Not Given 01/21/22 1505)  iohexol (OMNIPAQUE) 300 MG/ML solution 100 mL ( Intra-arterial MAR Unhold 01/21/22 1429)  clevidipine (CLEVIPREX) infusion 0.5 mg/mL (4 mg/hr Intravenous New Bag/Given 01/21/22 1746)  pravastatin (PRAVACHOL) tablet 20 mg (has no administration in time range)  metoprolol succinate (TOPROL-XL) 24 hr tablet 200 mg (has no administration in time range)  tenecteplase (TNKASE) injection for Stroke 25 mg ( Intravenous MAR Unhold 01/21/22 1429)  iohexol (OMNIPAQUE) 350 MG/ML injection 80 mL (80 mLs Intravenous Contrast Given 01/21/22 1202)  fentaNYL (SUBLIMAZE)  100 MCG/2ML injection (  Override pull for Anesthesia 01/21/22 1312)  clevidipine (CLEVIPREX) 0.5 MG/ML infusion (  Override pull for Anesthesia 01/21/22 1419)  iohexol (OMNIPAQUE) 300 MG/ML solution 100 mL (60 mLs Intra-arterial Contrast Given 01/21/22 1338)    ED Course/ Medical Decision Making/ A&P                           Medical Decision  Making Amount and/or Complexity of Data Reviewed Labs: ordered. Radiology: ordered.  Risk Decision regarding hospitalization.   This patient presents to the ED for concern of strokelike symptoms, this involves an extensive number of treatment options, and is a complaint that carries with it a high risk of complications and morbidity.  The differential diagnosis includes CVA, hypoglycemia, seizure, complex migraine, ICH, neoplasm   Co morbidities that complicate the patient evaluation  ESRD on HD, DM, HLD, HTN   Additional history obtained:  Additional history obtained from EMS External records from outside source obtained and reviewed including EMR   Lab Tests:  I Ordered, and personally interpreted labs.  The pertinent results include: Elevated creatinine and BUN consistent with ESRD, baseline anemia, no leukocytosis, normal electrolytes   Imaging Studies ordered:  I ordered imaging studies including CT code stroke studies I independently visualized and interpreted imaging which showed right MCA infarct I agree with the radiologist interpretation   Cardiac Monitoring: / EKG:  The patient was maintained on a cardiac monitor.  I personally viewed and interpreted the cardiac monitored which showed an underlying rhythm of: Sinus rhythm   Consultations Obtained:  I requested consultation with the neurologist,  and discussed lab and imaging findings as well as pertinent plan - they recommend: Initiation of TNKase and IR thrombectomy   Problem List / ED Course / Critical interventions / Medication management  56 year old female  presenting from hemodialysis via EMS for acute onset of left-sided facial droop, left hemibody weakness, and slurred speech.  Vital signs are normal on arrival.  Patient is alert and oriented.  She has clear left facial droop, slurred speech, left hemineglect, and left hemibody weakness.  Time of onset was specific given that she was at dialysis at the time.  She states that she was in her normal state of health this morning and did drive herself to the dialysis session.  Patient arrives as a code stroke and was taken to [facility].  CT scans confirmed right MCA infarct.  Neurology initiated [facility] and patient was taken to IR for thrombectomy.         Final Clinical Impression(s) / ED Diagnoses Final diagnoses:  Acute ischemic right middle cerebral artery (MCA) stroke [facility])    Rx / DC Orders ED Discharge Orders     None         Godfrey Pick, MD 01/21/22 1754

## 2022-01-21 NOTE — Assessment & Plan Note (Addendum)
Improving strength on left. Remains dysarthric.  - Secondary stroke prevention: ASA, Brillinta, statin - Echo pending.  - MRI at 24h mark - Arrhythmia monitoring for afib. - Swallow evaluation.

## 2022-01-22 ENCOUNTER — Inpatient Hospital Stay (HOSPITAL_COMMUNITY): Payer: Medicare Other

## 2022-01-22 ENCOUNTER — Other Ambulatory Visit (HOSPITAL_COMMUNITY): Payer: Medicare Other

## 2022-01-22 ENCOUNTER — Encounter (HOSPITAL_COMMUNITY): Payer: Self-pay | Admitting: Radiology

## 2022-01-22 DIAGNOSIS — E785 Hyperlipidemia, unspecified: Secondary | ICD-10-CM

## 2022-01-22 DIAGNOSIS — I63511 Cerebral infarction due to unspecified occlusion or stenosis of right middle cerebral artery: Secondary | ICD-10-CM | POA: Diagnosis not present

## 2022-01-22 DIAGNOSIS — I10 Essential (primary) hypertension: Secondary | ICD-10-CM

## 2022-01-22 LAB — ECHOCARDIOGRAM COMPLETE
AR max vel: 1.36 cm2
AV Area VTI: 1.4 cm2
AV Area mean vel: 1.29 cm2
AV Mean grad: 9 mmHg
AV Peak grad: 15.7 mmHg
Ao pk vel: 1.98 m/s
Area-P 1/2: 3.02 cm2
MV M vel: 4.78 m/s
MV Peak grad: 91.4 mmHg
Radius: 0.6 cm
S' Lateral: 3.9 cm
Weight: 3936.53 oz

## 2022-01-22 LAB — HEPATITIS C ANTIBODY: HCV Ab: NONREACTIVE

## 2022-01-22 LAB — RENAL FUNCTION PANEL
Albumin: 3.1 g/dL — ABNORMAL LOW (ref 3.5–5.0)
Anion gap: 18 — ABNORMAL HIGH (ref 5–15)
BUN: 60 mg/dL — ABNORMAL HIGH (ref 6–20)
CO2: 22 mmol/L (ref 22–32)
Calcium: 9.5 mg/dL (ref 8.9–10.3)
Chloride: 95 mmol/L — ABNORMAL LOW (ref 98–111)
Creatinine, Ser: 10.07 mg/dL — ABNORMAL HIGH (ref 0.44–1.00)
GFR, Estimated: 4 mL/min — ABNORMAL LOW (ref >60)
Glucose, Bld: 178 mg/dL — ABNORMAL HIGH (ref 70–99)
Phosphorus: 6.3 mg/dL — ABNORMAL HIGH (ref 2.5–4.6)
Potassium: 4.2 mmol/L (ref 3.5–5.1)
Sodium: 135 mmol/L (ref 135–145)

## 2022-01-22 LAB — GLUCOSE, CAPILLARY
Glucose-Capillary: 171 mg/dL — ABNORMAL HIGH (ref 70–99)
Glucose-Capillary: 182 mg/dL — ABNORMAL HIGH (ref 70–99)
Glucose-Capillary: 138 mg/dL — ABNORMAL HIGH (ref 70–99)
Glucose-Capillary: 198 mg/dL — ABNORMAL HIGH (ref 70–99)
Glucose-Capillary: 158 mg/dL — ABNORMAL HIGH (ref 70–99)
Glucose-Capillary: 201 mg/dL — ABNORMAL HIGH (ref 70–99)

## 2022-01-22 LAB — HEPATITIS B CORE ANTIBODY, TOTAL: Hep B Core Total Ab: NONREACTIVE

## 2022-01-22 LAB — LIPID PANEL
Cholesterol: 134 mg/dL (ref 0–200)
HDL: 36 mg/dL — ABNORMAL LOW (ref >40)
LDL Cholesterol: 79 mg/dL (ref 0–99)
Total CHOL/HDL Ratio: 3.7 RATIO
Triglycerides: 93 mg/dL (ref <150)
VLDL: 19 mg/dL (ref 0–40)

## 2022-01-22 LAB — HEMOGLOBIN A1C
Hgb A1c MFr Bld: 6 % — ABNORMAL HIGH (ref 4.8–5.6)
Mean Plasma Glucose: 125.5 mg/dL

## 2022-01-22 LAB — HEPATITIS B SURFACE ANTIGEN: Hepatitis B Surface Ag: NONREACTIVE

## 2022-01-22 LAB — HEPATITIS B SURFACE ANTIBODY,QUALITATIVE: Hep B S Ab: REACTIVE — AB

## 2022-01-22 MED ORDER — PANTOPRAZOLE SODIUM 40 MG PO TBEC
40.0000 mg | DELAYED_RELEASE_TABLET | Freq: Every day | ORAL | Status: DC
  Administered 2022-01-22 – 2022-01-23 (×2): 40 mg via ORAL
  Filled 2022-01-22 (×2): qty 1

## 2022-01-22 MED ORDER — EZETIMIBE 10 MG PO TABS: 10.0000 mg | ORAL_TABLET | Freq: Every day | ORAL | Status: DC

## 2022-01-22 MED ORDER — LIDOCAINE-PRILOCAINE 2.5-2.5 % EX CREA: 1.0000 "application " | TOPICAL_CREAM | CUTANEOUS | Status: DC | PRN

## 2022-01-22 MED ORDER — HEPARIN SODIUM (PORCINE) 1000 UNIT/ML DIALYSIS: 1000.0000 [IU] | INTRAMUSCULAR | Status: DC | PRN

## 2022-01-22 MED ORDER — CINACALCET HCL 30 MG PO TABS
30.0000 mg | ORAL_TABLET | Freq: Once | ORAL | Status: AC
  Administered 2022-01-23: 30 mg via ORAL
  Filled 2022-01-22 (×2): qty 1

## 2022-01-22 MED ORDER — PRAVASTATIN SODIUM 40 MG PO TABS
40.0000 mg | ORAL_TABLET | Freq: Every day | ORAL | Status: DC
  Administered 2022-01-22 – 2022-01-23 (×2): 40 mg via ORAL
  Filled 2022-01-22 (×2): qty 1

## 2022-01-22 MED ORDER — LIDOCAINE HCL (PF) 1 % IJ SOLN
5.0000 mL | INTRAMUSCULAR | Status: DC | PRN
Start: 2022-01-22 — End: 2022-01-24

## 2022-01-22 MED ORDER — ALTEPLASE 2 MG IJ SOLR: 2.0000 mg | Freq: Once | INTRAMUSCULAR | Status: DC | PRN

## 2022-01-22 MED ORDER — SODIUM ZIRCONIUM CYCLOSILICATE 10 G PO PACK: 10.0000 g | PACK | Freq: Every day | ORAL | Status: DC

## 2022-01-22 MED ORDER — FUROSEMIDE 10 MG/ML IJ SOLN
160.0000 mg | Freq: Once | INTRAVENOUS | Status: AC
  Administered 2022-01-22: 160 mg via INTRAVENOUS
  Filled 2022-01-22: qty 10

## 2022-01-22 MED ORDER — CHLORHEXIDINE GLUCONATE CLOTH 2 % EX PADS
6.0000 | MEDICATED_PAD | Freq: Every day | CUTANEOUS | Status: DC
  Administered 2022-01-22 – 2022-01-24 (×3): 6 via TOPICAL

## 2022-01-22 MED ORDER — PENTAFLUOROPROP-TETRAFLUOROETH EX AERO
1.0000 | INHALATION_SPRAY | CUTANEOUS | Status: DC | PRN
Start: 2022-01-22 — End: 2022-01-24

## 2022-01-22 NOTE — Progress Notes (Signed)
  Progress Note   Patient: Cindy Osborne QQV:[id number] DOB: 05-01-1966 DOA: 01/21/2022     1 DOS: the patient was seen and examined on 01/22/2022   Brief hospital course: Pt with s/p mechanical thrombectomy for Rt distal M1 occlusion yesterday by ADAPT technique. Pt has recovered well with complete resolution of left sided weakness. She is talking, well oriented x3, w/o any complaintes.  Assessment and Plan: No notes have been filed under this hospital service. Service: Hospitalist       Subjective: Stroke, with left sided weakness has resolved  Physical Exam: Vitals:   01/22/22 1300 01/22/22 1400 01/22/22 1500 01/22/22 1600  BP: 122/61 93/70 (!) 88/76   Pulse: 73 76 71   Resp: 13 15 19    Temp:    97.9 F (36.6 C)  TempSrc:    Oral  SpO2: 97% 96% 95%   Weight:       CN II to XII are grossly normal. Motor and sensory functions are WNL. Her motor strength in all the extremities are 5/5.  Data Reviewed:  There are no new results to review at this time.  Family Communication: Findings were discussed with the patient's mother and sister  Disposition: Status is: Inpatient Remains inpatient appropriate because: of recent stroke and thrombectomy yesterday.      Time spent: 10 minutes. 50% of the time was spent on emphasizing the importance of controlling her BP, DM, cholesterol, dieting and weight adequately.  Author: Glori Bickers, MD 01/22/2022 8:30 PM  For on call review [email or web address].

## 2022-01-22 NOTE — Progress Notes (Addendum)
[facility] Progress Note   Subjective:    Seen and examined patient at bedside. Doing much better. BP stable. Denies SOB, CP, and N/V. Plan for HD tomorrow morning first case.  Objective Vitals:   01/22/22 0600 01/22/22 0700 01/22/22 0754 01/22/22 0800  BP: 131/64 132/81  116/73  Pulse: 88 72  73  Resp: 10 16    Temp:   97.8 F (36.6 C)   TempSrc:   Oral   SpO2: 96% 92%  91%  Weight:       Physical Exam General: Well-appearing; on RA; NAD Heart: Normal S1 and S2; No murmurs, gallops, or rubs Lungs: Clear throughout; No wheezing, rales, or rhonchi Abdomen: Soft and non-tender Extremities: Bilateral BKAs-no edema at stumps Dialysis Access: AVF (+) B/T   Filed Weights   01/21/22 1100  Weight: 111.6 kg    Intake/Output Summary (Last 24 hours) at 01/22/2022 0908 Last data filed at 01/22/2022 0600 Gross per 24 hour  Intake 789.71 ml  Output 20 ml  Net 769.71 ml    Additional Objective Labs: Basic Metabolic Panel: Recent Labs  Lab 01/21/22 1147 01/21/22 1154  NA 137 138  K 3.7 3.7  CL 98 97*  CO2 27  --   GLUCOSE 147* 142*  BUN 47* 44*  CREATININE 9.16* 9.90*  CALCIUM 9.6  --    Liver Function Tests: Recent Labs  Lab 01/21/22 1147  AST 12*  ALT 9  ALKPHOS 153*  BILITOT 0.6  PROT 7.2  ALBUMIN 3.5   No results for input(s): "LIPASE", "AMYLASE" in the last 168 hours. CBC: Recent Labs  Lab 01/21/22 1147 01/21/22 1154  WBC 8.0  --   NEUTROABS 4.4  --   HGB 10.9* 11.9*  HCT 35.4* 35.0*  MCV 94.4  --   PLT 181  --    Blood Culture No results found for: "SDES", "SPECREQUEST", "CULT", "REPTSTATUS"  Cardiac Enzymes: No results for input(s): "CKTOTAL", "CKMB", "CKMBINDEX", "TROPONINI" in the last 168 hours. CBG: Recent Labs  Lab 01/21/22 1503 01/21/22 1955 01/21/22 2259 01/22/22 0322 01/22/22 0751  GLUCAP 125* 173* 229* 171* 182*   Iron Studies: No results for input(s): "IRON", "TIBC", "TRANSFERRIN", "FERRITIN" in the last 72  hours. Lab Results  Component Value Date   INR 1.2 01/21/2022   Studies/Results: IR PERCUTANEOUS ART THROMBECTOMY/INFUSION INTRACRANIAL INC DIAG ANGIO  Result Date: 01/21/2022 INDICATION: Patient with MCA syndrome, distal M1 occlusion, left hemiplegia EXAM: 1. EMERGENT LARGE VESSEL OCCLUSION THROMBOLYSIS (ANTERIOR CIRCULATION) 2. MECHANICAL THROMBECTOMY OF THE DISTAL M1 OCCLUSION BY THE ADAPT (A DIRECT ASPIRATION FIRST PASS TECHNIQUE) TECHNIQUE COMPARISON:  Correlation is made with a CTA of the head and neck performed on the same day earlier MEDICATIONS: Injection Ancef 2 g was administered within 1 hour of the procedure ANESTHESIA/SEDATION: General anesthesia CONTRAST:  60 mL of Omnipaque 300 FLUOROSCOPY TIME:  Fluoroscopy Time: 10 minutes 42 seconds (581 mGy). COMPLICATIONS: None immediate. TECHNIQUE: As the patient's family was not available and patient was not in a position to understand and consent for the procedure, procedure was performed under an emergent consent. The patient was then put under general anesthesia by the Department of Anesthesiology at [facility]. The right groin was prepped and draped in the usual sterile fashion. Thereafter using modified Seldinger technique, transfemoral access into the right common femoral artery was obtained without difficulty. Over a 0.035 inch guidewire a 8 French x 25 cm pinnacle was inserted. Through this, and also over a 0.035 inch glidewire  a a walrus balloon guide catheter with a 6 French neuron select catheter was advanced to the aortic arch region and selective catheterization of the right common carotid artery followed by angiogram was performed. Then, under roadmap guidance, the guide catheter was advanced into the distal intracranial internal carotid artery. Next, zoom 55 and phenom 21 microcatheter and synchro 14 microwire were utilized and under roadmap guidance, microcatheter and zoom 55 were advanced into the distal intracranial ICA.  Biplane DSA cerebral angiography of the right ICA injection was performed over the head. Then, under roadmap guidance, microwire and microcatheter were navigated into the M1 segment of the right MCA. The microwire was advanced into the M2-M3 segment past the occlusion at the distal M1 segment. Next, zoom 55 was advanced with its distal end at the site of the occlusion of the distal M1. The microcatheter and wire were removed and the zoom 55 was connected to the penumbra aspiration suction system. After 3 minutes of suction, the zoom 55 catheter was removed the walrus guide catheter was disconnected at the hub, 10 mL of blood was aspirated and then DSA cerebral angiogram was performed. Next, repeat DSA cerebral angiogram was performed after about 10 minutes to confirm the continued patency of the right MCA and its branches. Subsequently, dynamic CT head was performed. Next, the femoral sheath was partially withdrawn with its distal end in the external iliac artery and right femoral angiogram was performed and the femoral access site was closed by using an 8 [place] Angio-Seal device. Patient was then extubated and was transferred to the ICU in stable condition. FINDINGS: Right common carotid angiogram demonstrated common, internal and external carotid arteries to be widely patent without significant stenosis. Mild atheromatous disease. Biplane DSA cerebral angiography demonstrated distal intracranial internal carotid artery to be widely patent. Anterior cerebral artery and its branches have a normal appearance. There is occlusion at the distal M1 immediately distal to the origin of the anterior temporal artery. Post aspiration thrombectomy cerebral angiogram demonstrated complete revascularization with TICI 3 results. The right middle cerebral artery and its branches have a normal appearance. The venous phase of the angiogram is unremarkable with patent superior sagittal, inferior sagittal, transverse, sigmoid  sinuses. No aneurysm, av malformation or dural fistula seen. Dyna CT of the head demonstrated no intracranial bleed. IMPRESSION: Successful mechanical thrombectomy for distal right M1 occlusion of the MCA by direct aspiration first pass technique with TICI 3 results. PLAN: Patient will be admitted in the ICU for close monitoring. Systolic blood pressure goal to be at 120-140 mm Hg. Electronically Signed   By: Frazier Richards M.D.   On: 01/21/2022 15:23   IR CT Head Ltd  Result Date: 01/21/2022 INDICATION: Patient with MCA syndrome, distal M1 occlusion, left hemiplegia EXAM: 1. EMERGENT LARGE VESSEL OCCLUSION THROMBOLYSIS (ANTERIOR CIRCULATION) 2. MECHANICAL THROMBECTOMY OF THE DISTAL M1 OCCLUSION BY THE ADAPT (A DIRECT ASPIRATION FIRST PASS TECHNIQUE) TECHNIQUE COMPARISON:  Correlation is made with a CTA of the head and neck performed on the same day earlier MEDICATIONS: Injection Ancef 2 g was administered within 1 hour of the procedure ANESTHESIA/SEDATION: General anesthesia CONTRAST:  60 mL of Omnipaque 300 FLUOROSCOPY TIME:  Fluoroscopy Time: 10 minutes 42 seconds (581 mGy). COMPLICATIONS: None immediate. TECHNIQUE: As the patient's family was not available and patient was not in a position to understand and consent for the procedure, procedure was performed under an emergent consent. The patient was then put under general anesthesia by the Department of Anesthesiology at [facility]  [place] right groin was prepped and draped in the usual sterile fashion. Thereafter using modified Seldinger technique, transfemoral access into the right common femoral artery was obtained without difficulty. Over a 0.035 inch guidewire a 8 French x 25 cm pinnacle was inserted. Through this, and also over a 0.035 inch glidewire a a walrus balloon guide catheter with a 6 French neuron select catheter was advanced to the aortic arch region and selective catheterization of the right common carotid artery followed by angiogram  was performed. Then, under roadmap guidance, the guide catheter was advanced into the distal intracranial internal carotid artery. Next, zoom 55 and phenom 21 microcatheter and synchro 14 microwire were utilized and under roadmap guidance, microcatheter and zoom 55 were advanced into the distal intracranial ICA. Biplane DSA cerebral angiography of the right ICA injection was performed over the head. Then, under roadmap guidance, microwire and microcatheter were navigated into the M1 segment of the right MCA. The microwire was advanced into the M2-M3 segment past the occlusion at the distal M1 segment. Next, zoom 55 was advanced with its distal end at the site of the occlusion of the distal M1. The microcatheter and wire were removed and the zoom 55 was connected to the penumbra aspiration suction system. After 3 minutes of suction, the zoom 55 catheter was removed the walrus guide catheter was disconnected at the hub, 10 mL of blood was aspirated and then DSA cerebral angiogram was performed. Next, repeat DSA cerebral angiogram was performed after about 10 minutes to confirm the continued patency of the right MCA and its branches. Subsequently, dynamic CT head was performed. Next, the femoral sheath was partially withdrawn with its distal end in the external iliac artery and right femoral angiogram was performed and the femoral access site was closed by using an 8 [place] Angio-Seal device. Patient was then extubated and was transferred to the ICU in stable condition. FINDINGS: Right common carotid angiogram demonstrated common, internal and external carotid arteries to be widely patent without significant stenosis. Mild atheromatous disease. Biplane DSA cerebral angiography demonstrated distal intracranial internal carotid artery to be widely patent. Anterior cerebral artery and its branches have a normal appearance. There is occlusion at the distal M1 immediately distal to the origin of the anterior temporal  artery. Post aspiration thrombectomy cerebral angiogram demonstrated complete revascularization with TICI 3 results. The right middle cerebral artery and its branches have a normal appearance. The venous phase of the angiogram is unremarkable with patent superior sagittal, inferior sagittal, transverse, sigmoid sinuses. No aneurysm, av malformation or dural fistula seen. Dyna CT of the head demonstrated no intracranial bleed. IMPRESSION: Successful mechanical thrombectomy for distal right M1 occlusion of the MCA by direct aspiration first pass technique with TICI 3 results. PLAN: Patient will be admitted in the ICU for close monitoring. Systolic blood pressure goal to be at 120-140 mm Hg. Electronically Signed   By: Frazier Richards M.D.   On: 01/21/2022 15:23   IR US Guide Vasc Access Right  Result Date: 01/21/2022 INDICATION: Patient with MCA syndrome, distal M1 occlusion, left hemiplegia EXAM: 1. EMERGENT LARGE VESSEL OCCLUSION THROMBOLYSIS (ANTERIOR CIRCULATION) 2. MECHANICAL THROMBECTOMY OF THE DISTAL M1 OCCLUSION BY THE ADAPT (A DIRECT ASPIRATION FIRST PASS TECHNIQUE) TECHNIQUE COMPARISON:  Correlation is made with a CTA of the head and neck performed on the same day earlier MEDICATIONS: Injection Ancef 2 g was administered within 1 hour of the procedure ANESTHESIA/SEDATION: General anesthesia CONTRAST:  60 mL of Omnipaque 300 FLUOROSCOPY TIME:  Fluoroscopy Time: 10 minutes 42 seconds (581 mGy). COMPLICATIONS: None immediate. TECHNIQUE: As the patient's family was not available and patient was not in a position to understand and consent for the procedure, procedure was performed under an emergent consent. The patient was then put under general anesthesia by the Department of Anesthesiology at [facility]. The right groin was prepped and draped in the usual sterile fashion. Thereafter using modified Seldinger technique, transfemoral access into the right common femoral artery was obtained without  difficulty. Over a 0.035 inch guidewire a 8 French x 25 cm pinnacle was inserted. Through this, and also over a 0.035 inch glidewire a a walrus balloon guide catheter with a 6 French neuron select catheter was advanced to the aortic arch region and selective catheterization of the right common carotid artery followed by angiogram was performed. Then, under roadmap guidance, the guide catheter was advanced into the distal intracranial internal carotid artery. Next, zoom 55 and phenom 21 microcatheter and synchro 14 microwire were utilized and under roadmap guidance, microcatheter and zoom 55 were advanced into the distal intracranial ICA. Biplane DSA cerebral angiography of the right ICA injection was performed over the head. Then, under roadmap guidance, microwire and microcatheter were navigated into the M1 segment of the right MCA. The microwire was advanced into the M2-M3 segment past the occlusion at the distal M1 segment. Next, zoom 55 was advanced with its distal end at the site of the occlusion of the distal M1. The microcatheter and wire were removed and the zoom 55 was connected to the penumbra aspiration suction system. After 3 minutes of suction, the zoom 55 catheter was removed the walrus guide catheter was disconnected at the hub, 10 mL of blood was aspirated and then DSA cerebral angiogram was performed. Next, repeat DSA cerebral angiogram was performed after about 10 minutes to confirm the continued patency of the right MCA and its branches. Subsequently, dynamic CT head was performed. Next, the femoral sheath was partially withdrawn with its distal end in the external iliac artery and right femoral angiogram was performed and the femoral access site was closed by using an 8 [place] Angio-Seal device. Patient was then extubated and was transferred to the ICU in stable condition. FINDINGS: Right common carotid angiogram demonstrated common, internal and external carotid arteries to be widely patent  without significant stenosis. Mild atheromatous disease. Biplane DSA cerebral angiography demonstrated distal intracranial internal carotid artery to be widely patent. Anterior cerebral artery and its branches have a normal appearance. There is occlusion at the distal M1 immediately distal to the origin of the anterior temporal artery. Post aspiration thrombectomy cerebral angiogram demonstrated complete revascularization with TICI 3 results. The right middle cerebral artery and its branches have a normal appearance. The venous phase of the angiogram is unremarkable with patent superior sagittal, inferior sagittal, transverse, sigmoid sinuses. No aneurysm, av malformation or dural fistula seen. Dyna CT of the head demonstrated no intracranial bleed. IMPRESSION: Successful mechanical thrombectomy for distal right M1 occlusion of the MCA by direct aspiration first pass technique with TICI 3 results. PLAN: Patient will be admitted in the ICU for close monitoring. Systolic blood pressure goal to be at 120-140 mm Hg. Electronically Signed   By: Frazier Richards M.D.   On: 01/21/2022 15:23   CT ANGIO HEAD NECK W WO CM (CODE STROKE)  Result Date: 01/21/2022 CLINICAL DATA:  Acute stroke suspected EXAM: CT ANGIOGRAPHY HEAD AND NECK TECHNIQUE: Multidetector CT imaging of the head and neck was performed  using the standard protocol during bolus administration of intravenous contrast. Multiplanar CT image reconstructions and MIPs were obtained to evaluate the vascular anatomy. Carotid stenosis measurements (when applicable) are obtained utilizing NASCET criteria, using the distal internal carotid diameter as the denominator. RADIATION DOSE REDUCTION: This exam was performed according to the departmental dose-optimization program which includes automated exposure control, adjustment of the mA and/or kV according to patient size and/or use of iterative reconstruction technique. CONTRAST:  81mL OMNIPAQUE IOHEXOL 350 MG/ML SOLN  COMPARISON:  None Available. FINDINGS: CTA NECK FINDINGS Aortic arch: Atheromatous plaque.  Three vessel branching. Right carotid system: Diffuse atheromatous plaque, primarily calcified. No flow limiting stenosis or ulceration of the common and internal carotid arteries. ECA origin stenosis. Left carotid system: Calcified atheromatous plaque without stenosis or ulceration. Vertebral arteries: No proximal subclavian stenosis. Calcified plaque at the right vertebral origin. No flow limiting stenosis, beading, or dissection. Skeleton: Renal osteodystrophy findings at the medial clavicular heads. Other neck: 13 mm left thyroid nodule. No followup recommended (ref: J Am Coll Radiol. 2015 Feb;12(2): 143-50). Upper chest: At least moderate volume layering right pleural effusion. Review of the MIP images confirms the above findings CTA HEAD FINDINGS Anterior circulation: Abrupt cut off of the right MCA just beyond the anterior temporal branch. Partial downstream reconstitution. No left-sided or anterior cerebral embolic disease is seen. Confluent calcified plaque on the carotid siphons Posterior circulation: The vertebral and basilar arteries are smoothly contoured and diffusely patent. No branch occlusion, beading, or aneurysm. Venous sinuses: Unremarkable for the arterial phase Anatomic variants: None significant Review of the MIP images confirms the above findings Critical Value/emergent results were called by telephone at the time of interpretation on 01/21/2022 at 12:05 pm to provider [facility] , who verbally acknowledged these results. IMPRESSION: 1. Emergent large vessel occlusion at the right M1 segment, just beyond the anterior temporal branch. 2. Atherosclerosis without flow limiting stenosis or embolic source seen in the more proximal neck. 3. Layering right pleural effusion. Electronically Signed   By: Jorje Guild M.D.   On: 01/21/2022 12:10   CT HEAD CODE STROKE WO CONTRAST  Result Date:  01/21/2022 CLINICAL DATA:  Code stroke.  Left-sided weakness EXAM: CT HEAD WITHOUT CONTRAST TECHNIQUE: Contiguous axial images were obtained from the base of the skull through the vertex without intravenous contrast. RADIATION DOSE REDUCTION: This exam was performed according to the departmental dose-optimization program which includes automated exposure control, adjustment of the mA and/or kV according to patient size and/or use of iterative reconstruction technique. COMPARISON:  None Available. FINDINGS: Brain: No hemorrhage or convincing acute infarction. Remote left occipital parietal cortex infarct. No hydrocephalus or masslike finding Vascular: Only seen on reformats (due to slice spacing) there is a dense appearance at the distal right MCA Skull: Heterogeneous calvarial density diffusely, likely renal osteodystrophy. Sinuses/Orbits: No acute finding Other: These results were communicated to Dr. Leonel Ramsay at 11:55 am on 01/21/2022 by text page via the [facility] messaging system. ASPECTS [facility] Stroke Program Early CT Score) -when compared to gray-white differentiation on the left - Ganglionic level infarction (caudate, lentiform nuclei, internal capsule, insula, M1-M3 cortex): 7 - Supraganglionic infarction (M4-M6 cortex): 3 Total score (0-10 with 10 being normal): 10 IMPRESSION: 1. Possible dense right MCA. 2. No intracranial hemorrhage.  ASPECTS is 10. 3. Remote left occipital parietal cortex infarction. Electronically Signed   By: Jorje Guild M.D.   On: 01/21/2022 11:57    Medications:  clevidipine Stopped (01/22/22 0325)   furosemide  stroke: early stages of recovery book   Does not apply Once   Chlorhexidine Gluconate Cloth  6 each Topical Daily   insulin aspart  0-15 Units Subcutaneous Q4H   metoprolol succinate  200 mg Oral Daily   montelukast  10 mg Oral QHS   pantoprazole (PROTONIX) IV  40 mg Intravenous QHS   pravastatin  20 mg Oral q1800   sodium chloride flush  3 mL Intravenous  Once    Dialysis Orders: [facility] TTS - 4hrs; BFR 400; DFR 600; 2K/2.5Ca; EDW 105kg -Heparin 2000 unit bolus with HD -Sensipar 30mg  with HD -Calcitriol 0.61mcg with HD-last dose 01/19/22 -Venofer 100mg  IV X 5 doses (new order, suppose to start 6/10) -Micera 2110mcg Q2wks-last dose 01/14/22  Assessment/Plan: Arterial Ischemic CVA/MCA-s/p mechanical thrombectomy 6/10 in IR; on 2nd CVA prevention: ASA, Brillinta, statin; ECHO pending; MRI and swallow eval ordered; followed by critical care team ESRD - on HD TTS; no urgent need for HD today. Plan for HD on 6/12 first case. Patient reports 3.6kg is removed from pre-HD weight d/t BL amputations. I will set UFG 2-3L as tolerated. If she remains inpatient, will also schedule HD 6/13 to place her back on routine schedule. If remains in ICU, HD will need to be done at bedside. Hypertension/volume-patient remains euvolemic on exam and Bps stable. Noted patient ordered 1x dose IV lasix. Monitor weight trends closely. Anemia of CKD - Hgb 11.9; ESA last given 01/14/22. No indication to resume ESA or Fe at this time. Checking labs in AM. 5.   Secondary Hyperparathyroidism - Corr Ca 10.2 and PO4 elevated. Will resume Sensipar and hold Calcitriol for now. 6.   Nutrition - Advance to renal diet once clinically stable  Tobie Poet, NP [facility] 01/22/2022,9:08 AM  LOS: 1 day    I have seen and examined this patient and agree with plan and assessment in the above note with renal recommendations/intervention highlighted.  Feels much better and no focal deficits.  Per Neurology, she will be able to be discharged after HD tomorrow.  She will then follow up with her outpatient schedule on Tuesday.  Broadus John A Rosenda Geffrard,MD 01/22/2022 11:14 AM

## 2022-01-22 NOTE — Progress Notes (Addendum)
STROKE TEAM PROGRESS NOTE   INTERVAL HISTORY Patient is seen in her room with her RN at the bedside.  Yesterday, when she went to her dialysis appointment, she experienced acute onset left sided weakness and sensory deficit.  With NIH stroke scale 16 on admission.  CTA showed right M1 occlusion TNK was given in the ED, and patient underwent thrombectomy.  This was successful with TICI 3 flow achieved.  Patient has been extubated and is now doing well with very few deficits seen on neuro exam. Vital signs are stable.  Blood pressure adequately controlled. Vitals:   01/22/22 0754 01/22/22 0800 01/22/22 0900 01/22/22 1000  BP:  116/73 138/73 121/67  Pulse:  73 72 75  Resp:    14  Temp: 97.8 F (36.6 C)     TempSrc: Oral     SpO2:  91% 94% (!) 86%  Weight:       CBC:  Recent Labs  Lab 01/21/22 1147 01/21/22 1154  WBC 8.0  --   NEUTROABS 4.4  --   HGB 10.9* 11.9*  HCT 35.4* 35.0*  MCV 94.4  --   PLT 181  --    Basic Metabolic Panel:  Recent Labs  Lab 01/21/22 1147 01/21/22 1154 01/22/22 1018  NA 137 138 135  K 3.7 3.7 4.2  CL 98 97* 95*  CO2 27  --  22  GLUCOSE 147* 142* 178*  BUN 47* 44* 60*  CREATININE 9.16* 9.90* 10.07*  CALCIUM 9.6  --  9.5  PHOS  --   --  6.3*   Lipid Panel:  Recent Labs  Lab 01/22/22 0328  CHOL 134  TRIG 93  HDL 36*  CHOLHDL 3.7  VLDL 19  LDLCALC 79   HgbA1c:  Recent Labs  Lab 01/22/22 1018  HGBA1C 6.0*   Urine Drug Screen: No results for input(s): "LABOPIA", "COCAINSCRNUR", "LABBENZ", "AMPHETMU", "THCU", "LABBARB" in the last 168 hours.  Alcohol Level No results for input(s): "ETH" in the last 168 hours.  IMAGING past 24 hours IR PERCUTANEOUS ART THROMBECTOMY/INFUSION INTRACRANIAL INC DIAG ANGIO  Result Date: 01/21/2022 INDICATION: Patient with MCA syndrome, distal M1 occlusion, left hemiplegia EXAM: 1. EMERGENT LARGE VESSEL OCCLUSION THROMBOLYSIS (ANTERIOR CIRCULATION) 2. MECHANICAL THROMBECTOMY OF THE DISTAL M1 OCCLUSION BY THE  ADAPT (A DIRECT ASPIRATION FIRST PASS TECHNIQUE) TECHNIQUE COMPARISON:  Correlation is made with a CTA of the head and neck performed on the same day earlier MEDICATIONS: Injection Ancef 2 g was administered within 1 hour of the procedure ANESTHESIA/SEDATION: General anesthesia CONTRAST:  60 mL of Omnipaque 300 FLUOROSCOPY TIME:  Fluoroscopy Time: 10 minutes 42 seconds (581 mGy). COMPLICATIONS: None immediate. TECHNIQUE: As the patient's family was not available and patient was not in a position to understand and consent for the procedure, procedure was performed under an emergent consent. The patient was then put under general anesthesia by the Department of Anesthesiology at [facility]. The right groin was prepped and draped in the usual sterile fashion. Thereafter using modified Seldinger technique, transfemoral access into the right common femoral artery was obtained without difficulty. Over a 0.035 inch guidewire a 8 French x 25 cm pinnacle was inserted. Through this, and also over a 0.035 inch glidewire a a walrus balloon guide catheter with a 6 French neuron select catheter was advanced to the aortic arch region and selective catheterization of the right common carotid artery followed by angiogram was performed. Then, under roadmap guidance, the guide catheter was advanced into the distal intracranial  internal carotid artery. Next, zoom 55 and phenom 21 microcatheter and synchro 14 microwire were utilized and under roadmap guidance, microcatheter and zoom 55 were advanced into the distal intracranial ICA. Biplane DSA cerebral angiography of the right ICA injection was performed over the head. Then, under roadmap guidance, microwire and microcatheter were navigated into the M1 segment of the right MCA. The microwire was advanced into the M2-M3 segment past the occlusion at the distal M1 segment. Next, zoom 55 was advanced with its distal end at the site of the occlusion of the distal M1. The  microcatheter and wire were removed and the zoom 55 was connected to the penumbra aspiration suction system. After 3 minutes of suction, the zoom 55 catheter was removed the walrus guide catheter was disconnected at the hub, 10 mL of blood was aspirated and then DSA cerebral angiogram was performed. Next, repeat DSA cerebral angiogram was performed after about 10 minutes to confirm the continued patency of the right MCA and its branches. Subsequently, dynamic CT head was performed. Next, the femoral sheath was partially withdrawn with its distal end in the external iliac artery and right femoral angiogram was performed and the femoral access site was closed by using an 8 [place] Angio-Seal device. Patient was then extubated and was transferred to the ICU in stable condition. FINDINGS: Right common carotid angiogram demonstrated common, internal and external carotid arteries to be widely patent without significant stenosis. Mild atheromatous disease. Biplane DSA cerebral angiography demonstrated distal intracranial internal carotid artery to be widely patent. Anterior cerebral artery and its branches have a normal appearance. There is occlusion at the distal M1 immediately distal to the origin of the anterior temporal artery. Post aspiration thrombectomy cerebral angiogram demonstrated complete revascularization with TICI 3 results. The right middle cerebral artery and its branches have a normal appearance. The venous phase of the angiogram is unremarkable with patent superior sagittal, inferior sagittal, transverse, sigmoid sinuses. No aneurysm, av malformation or dural fistula seen. Dyna CT of the head demonstrated no intracranial bleed. IMPRESSION: Successful mechanical thrombectomy for distal right M1 occlusion of the MCA by direct aspiration first pass technique with TICI 3 results. PLAN: Patient will be admitted in the ICU for close monitoring. Systolic blood pressure goal to be at 120-140 mm Hg. Electronically  Signed   By: Frazier Richards M.D.   On: 01/21/2022 15:23   IR CT Head Ltd  Result Date: 01/21/2022 INDICATION: Patient with MCA syndrome, distal M1 occlusion, left hemiplegia EXAM: 1. EMERGENT LARGE VESSEL OCCLUSION THROMBOLYSIS (ANTERIOR CIRCULATION) 2. MECHANICAL THROMBECTOMY OF THE DISTAL M1 OCCLUSION BY THE ADAPT (A DIRECT ASPIRATION FIRST PASS TECHNIQUE) TECHNIQUE COMPARISON:  Correlation is made with a CTA of the head and neck performed on the same day earlier MEDICATIONS: Injection Ancef 2 g was administered within 1 hour of the procedure ANESTHESIA/SEDATION: General anesthesia CONTRAST:  60 mL of Omnipaque 300 FLUOROSCOPY TIME:  Fluoroscopy Time: 10 minutes 42 seconds (581 mGy). COMPLICATIONS: None immediate. TECHNIQUE: As the patient's family was not available and patient was not in a position to understand and consent for the procedure, procedure was performed under an emergent consent. The patient was then put under general anesthesia by the Department of Anesthesiology at [facility]. The right groin was prepped and draped in the usual sterile fashion. Thereafter using modified Seldinger technique, transfemoral access into the right common femoral artery was obtained without difficulty. Over a 0.035 inch guidewire a 8 French x 25 cm pinnacle was inserted. Through this,  and also over a 0.035 inch glidewire a a walrus balloon guide catheter with a 6 [place] neuron select catheter was advanced to the aortic arch region and selective catheterization of the right common carotid artery followed by angiogram was performed. Then, under roadmap guidance, the guide catheter was advanced into the distal intracranial internal carotid artery. Next, zoom 55 and phenom 21 microcatheter and synchro 14 microwire were utilized and under roadmap guidance, microcatheter and zoom 55 were advanced into the distal intracranial ICA. Biplane DSA cerebral angiography of the right ICA injection was performed over the  head. Then, under roadmap guidance, microwire and microcatheter were navigated into the M1 segment of the right MCA. The microwire was advanced into the M2-M3 segment past the occlusion at the distal M1 segment. Next, zoom 55 was advanced with its distal end at the site of the occlusion of the distal M1. The microcatheter and wire were removed and the zoom 55 was connected to the penumbra aspiration suction system. After 3 minutes of suction, the zoom 55 catheter was removed the walrus guide catheter was disconnected at the hub, 10 mL of blood was aspirated and then DSA cerebral angiogram was performed. Next, repeat DSA cerebral angiogram was performed after about 10 minutes to confirm the continued patency of the right MCA and its branches. Subsequently, dynamic CT head was performed. Next, the femoral sheath was partially withdrawn with its distal end in the external iliac artery and right femoral angiogram was performed and the femoral access site was closed by using an 8 [place] Angio-Seal device. Patient was then extubated and was transferred to the ICU in stable condition. FINDINGS: Right common carotid angiogram demonstrated common, internal and external carotid arteries to be widely patent without significant stenosis. Mild atheromatous disease. Biplane DSA cerebral angiography demonstrated distal intracranial internal carotid artery to be widely patent. Anterior cerebral artery and its branches have a normal appearance. There is occlusion at the distal M1 immediately distal to the origin of the anterior temporal artery. Post aspiration thrombectomy cerebral angiogram demonstrated complete revascularization with TICI 3 results. The right middle cerebral artery and its branches have a normal appearance. The venous phase of the angiogram is unremarkable with patent superior sagittal, inferior sagittal, transverse, sigmoid sinuses. No aneurysm, av malformation or dural fistula seen. Dyna CT of the head  demonstrated no intracranial bleed. IMPRESSION: Successful mechanical thrombectomy for distal right M1 occlusion of the MCA by direct aspiration first pass technique with TICI 3 results. PLAN: Patient will be admitted in the ICU for close monitoring. Systolic blood pressure goal to be at 120-140 mm Hg. Electronically Signed   By: Frazier Richards M.D.   On: 01/21/2022 15:23   IR US Guide Vasc Access Right  Result Date: 01/21/2022 INDICATION: Patient with MCA syndrome, distal M1 occlusion, left hemiplegia EXAM: 1. EMERGENT LARGE VESSEL OCCLUSION THROMBOLYSIS (ANTERIOR CIRCULATION) 2. MECHANICAL THROMBECTOMY OF THE DISTAL M1 OCCLUSION BY THE ADAPT (A DIRECT ASPIRATION FIRST PASS TECHNIQUE) TECHNIQUE COMPARISON:  Correlation is made with a CTA of the head and neck performed on the same day earlier MEDICATIONS: Injection Ancef 2 g was administered within 1 hour of the procedure ANESTHESIA/SEDATION: General anesthesia CONTRAST:  60 mL of Omnipaque 300 FLUOROSCOPY TIME:  Fluoroscopy Time: 10 minutes 42 seconds (581 mGy). COMPLICATIONS: None immediate. TECHNIQUE: As the patient's family was not available and patient was not in a position to understand and consent for the procedure, procedure was performed under an emergent consent. The patient was then put under  general anesthesia by the Department of Anesthesiology at [facility]. The right groin was prepped and draped in the usual sterile fashion. Thereafter using modified Seldinger technique, transfemoral access into the right common femoral artery was obtained without difficulty. Over a 0.035 inch guidewire a 8 French x 25 cm pinnacle was inserted. Through this, and also over a 0.035 inch glidewire a a walrus balloon guide catheter with a 6 French neuron select catheter was advanced to the aortic arch region and selective catheterization of the right common carotid artery followed by angiogram was performed. Then, under roadmap guidance, the guide catheter was  advanced into the distal intracranial internal carotid artery. Next, zoom 55 and phenom 21 microcatheter and synchro 14 microwire were utilized and under roadmap guidance, microcatheter and zoom 55 were advanced into the distal intracranial ICA. Biplane DSA cerebral angiography of the right ICA injection was performed over the head. Then, under roadmap guidance, microwire and microcatheter were navigated into the M1 segment of the right MCA. The microwire was advanced into the M2-M3 segment past the occlusion at the distal M1 segment. Next, zoom 55 was advanced with its distal end at the site of the occlusion of the distal M1. The microcatheter and wire were removed and the zoom 55 was connected to the penumbra aspiration suction system. After 3 minutes of suction, the zoom 55 catheter was removed the walrus guide catheter was disconnected at the hub, 10 mL of blood was aspirated and then DSA cerebral angiogram was performed. Next, repeat DSA cerebral angiogram was performed after about 10 minutes to confirm the continued patency of the right MCA and its branches. Subsequently, dynamic CT head was performed. Next, the femoral sheath was partially withdrawn with its distal end in the external iliac artery and right femoral angiogram was performed and the femoral access site was closed by using an 8 [place] Angio-Seal device. Patient was then extubated and was transferred to the ICU in stable condition. FINDINGS: Right common carotid angiogram demonstrated common, internal and external carotid arteries to be widely patent without significant stenosis. Mild atheromatous disease. Biplane DSA cerebral angiography demonstrated distal intracranial internal carotid artery to be widely patent. Anterior cerebral artery and its branches have a normal appearance. There is occlusion at the distal M1 immediately distal to the origin of the anterior temporal artery. Post aspiration thrombectomy cerebral angiogram demonstrated  complete revascularization with TICI 3 results. The right middle cerebral artery and its branches have a normal appearance. The venous phase of the angiogram is unremarkable with patent superior sagittal, inferior sagittal, transverse, sigmoid sinuses. No aneurysm, av malformation or dural fistula seen. Dyna CT of the head demonstrated no intracranial bleed. IMPRESSION: Successful mechanical thrombectomy for distal right M1 occlusion of the MCA by direct aspiration first pass technique with TICI 3 results. PLAN: Patient will be admitted in the ICU for close monitoring. Systolic blood pressure goal to be at 120-140 mm Hg. Electronically Signed   By: Frazier Richards M.D.   On: 01/21/2022 15:23   CT ANGIO HEAD NECK W WO CM (CODE STROKE)  Result Date: 01/21/2022 CLINICAL DATA:  Acute stroke suspected EXAM: CT ANGIOGRAPHY HEAD AND NECK TECHNIQUE: Multidetector CT imaging of the head and neck was performed using the standard protocol during bolus administration of intravenous contrast. Multiplanar CT image reconstructions and MIPs were obtained to evaluate the vascular anatomy. Carotid stenosis measurements (when applicable) are obtained utilizing NASCET criteria, using the distal internal carotid diameter as the denominator. RADIATION DOSE REDUCTION: This  exam was performed according to the departmental dose-optimization program which includes automated exposure control, adjustment of the mA and/or kV according to patient size and/or use of iterative reconstruction technique. CONTRAST:  65mL OMNIPAQUE IOHEXOL 350 MG/ML SOLN COMPARISON:  None Available. FINDINGS: CTA NECK FINDINGS Aortic arch: Atheromatous plaque.  Three vessel branching. Right carotid system: Diffuse atheromatous plaque, primarily calcified. No flow limiting stenosis or ulceration of the common and internal carotid arteries. ECA origin stenosis. Left carotid system: Calcified atheromatous plaque without stenosis or ulceration. Vertebral arteries: No  proximal subclavian stenosis. Calcified plaque at the right vertebral origin. No flow limiting stenosis, beading, or dissection. Skeleton: Renal osteodystrophy findings at the medial clavicular heads. Other neck: 13 mm left thyroid nodule. No followup recommended (ref: J Am Coll Radiol. 2015 Feb;12(2): 143-50). Upper chest: At least moderate volume layering right pleural effusion. Review of the MIP images confirms the above findings CTA HEAD FINDINGS Anterior circulation: Abrupt cut off of the right MCA just beyond the anterior temporal branch. Partial downstream reconstitution. No left-sided or anterior cerebral embolic disease is seen. Confluent calcified plaque on the carotid siphons Posterior circulation: The vertebral and basilar arteries are smoothly contoured and diffusely patent. No branch occlusion, beading, or aneurysm. Venous sinuses: Unremarkable for the arterial phase Anatomic variants: None significant Review of the MIP images confirms the above findings Critical Value/emergent results were called by telephone at the time of interpretation on 01/21/2022 at 12:05 pm to provider MCNEILL [facility] , who verbally acknowledged these results. IMPRESSION: 1. Emergent large vessel occlusion at the right M1 segment, just beyond the anterior temporal branch. 2. Atherosclerosis without flow limiting stenosis or embolic source seen in the more proximal neck. 3. Layering right pleural effusion. Electronically Signed   By: Jorje Guild M.D.   On: 01/21/2022 12:10   CT HEAD CODE STROKE WO CONTRAST  Result Date: 01/21/2022 CLINICAL DATA:  Code stroke.  Left-sided weakness EXAM: CT HEAD WITHOUT CONTRAST TECHNIQUE: Contiguous axial images were obtained from the base of the skull through the vertex without intravenous contrast. RADIATION DOSE REDUCTION: This exam was performed according to the departmental dose-optimization program which includes automated exposure control, adjustment of the mA and/or kV  according to patient size and/or use of iterative reconstruction technique. COMPARISON:  None Available. FINDINGS: Brain: No hemorrhage or convincing acute infarction. Remote left occipital parietal cortex infarct. No hydrocephalus or masslike finding Vascular: Only seen on reformats (due to slice spacing) there is a dense appearance at the distal right MCA Skull: Heterogeneous calvarial density diffusely, likely renal osteodystrophy. Sinuses/Orbits: No acute finding Other: These results were communicated to Dr. Leonel Ramsay at 11:55 am on 01/21/2022 by text page via the [facility] messaging system. ASPECTS [facility] Stroke Program Early CT Score) -when compared to gray-white differentiation on the left - Ganglionic level infarction (caudate, lentiform nuclei, internal capsule, insula, M1-M3 cortex): 7 - Supraganglionic infarction (M4-M6 cortex): 3 Total score (0-10 with 10 being normal): 10 IMPRESSION: 1. Possible dense right MCA. 2. No intracranial hemorrhage.  ASPECTS is 10. 3. Remote left occipital parietal cortex infarction. Electronically Signed   By: Jorje Guild M.D.   On: 01/21/2022 11:57    PHYSICAL EXAM General:  Alert, obese middle-aged African-American lady with bilateral BKAs in no acute distress Respiratory:  Regular, unlabored respirations on room air  NEURO:  Mental Status: AA&Ox3  Speech/Language: speech is without dysarthria or aphasia.  Fluency, and comprehension intact.  Cranial Nerves:  II: PERRL. Visual fields full.  III, IV, VI: EOMI. Eyelids  elevate symmetrically.  V: Sensation is intact to light touch and symmetrical to face.  VII: Smile is symmetrical. Able to puff cheeks and raise eyebrows.  VIII: hearing intact to voice. IX, X: Phonation is normal.  XII: tongue is midline without fasciculations. Motor: 5/5 strength to all muscle groups tested.  Diminished fine finger movements on the left.  Orbits right over left upper extremity. Tone: is normal and bulk is  normal Sensation- Intact to light touch bilaterally.  Coordination: FTN intact bilaterally, HKS: no ataxia in BLE.No drift. Fine motor movements slowed on right with left hand orbiting right Gait- deferred  NIH stroke scale 0  ASSESSMENT/PLAN Cindy Osborne is a 56 y.o. female with history of T2DM, bilateral BKA and ESRD presenting with acute onset left sided weakness and sensory deficit.  TNK was given in the ED, and patient underwent thrombectomy.  This was successful with TICI 3 flow achieved.  Patient has been extubated and is now doing well with very few deficits seen on neuro exam.  Stroke:  right MCA infarct likely secondary due to right M1 occlusion s/p TNK and mechanical thrombectomy with TICI 3 revascularization Code Stroke CT head dense right MCA CTA head & neck LVO at right M1 segment MRI  pending 2D Echo pending LDL 79 HgbA1c 6.0 VTE prophylaxis - unable to wear SCDs due to BKA, hold lovenox for 24 hours post TNK    Diet   Diet Carb Modified Fluid consistency: Thin; Room service appropriate? Yes; Fluid restriction: 1200 mL Fluid   aspirin 81 mg daily prior to admission, now on No antithrombotic <24 hours post TNK Therapy recommendations:  no PT/OT follow up Disposition:  pending, likely home  Hypertension Home meds:  amlodipine 10 mg daily, metoprolol XR 200 mg daily Stable Permissive hypertension (OK if < 220/120) but gradually normalize in 5-7 days Long-term BP goal normotensive  Hyperlipidemia Home meds:  pravastatin 20 mg daily, increase to 40 mg daily LDL 79, goal < 70 High intensity statin not indicated due to previous intolerance Continue statin at discharge  Diabetes type II Controlled Home meds:  insulin glargine 18 units daily HgbA1c 6.0, goal < 7.0 CBGs Recent Labs    01/21/22 2259 01/22/22 0322 01/22/22 0751  GLUCAP 229* 171* 182*    SSI  ESRD Patient is on IHD outpatient Plan for dialysis tomorrow  Other Stroke Risk  Factors Obesity, Body mass index is 36.33 kg/m., BMI >/= 30 associated with increased stroke risk, recommend weight loss, diet and exercise as appropriate   Other Active Problems none  Hospital day # Archer , MSN, AGACNP-BC [facility] See Amion for schedule and pager information 01/22/2022 11:08 AM    STROKE MD NOTE :  I have personally obtained history,examined this patient, reviewed notes, independently viewed imaging studies, participated in medical decision making and plan of care.ROS completed by me personally and pertinent positives fully documented  I have made any additions or clarifications directly to the above note. Agree with note above.  Patient presented with left hemiparesis due to right M1 occlusion and was treated with thrombolysis with IV TNK followed by successful mechanical thrombectomy with resultant TICI 3 revascularization.  She is doing neurologically quite well with practically no residual deficits.  Continue close neurological observation and strict blood pressure control as per post TNK and thrombectomy protocol.  Keep systolic blood pressure in the 120-140 range for the first 24 hours.  Check MRI scan of the brain  later today.  Mobilize out of bed.  Physical Occupational Therapy consults.  Decision on antiplatelet agents after the brain MRI.  Check echocardiogram and continue cardiac monitoring.  Long discussion with the patient and daughter and mother at the bedside and answered questions.  Discussed with Dr. Lynetta Mare critical care medicine.This patient is critically ill and at significant risk of neurological worsening, death and care requires constant monitoring of vital signs, hemodynamics,respiratory and cardiac monitoring, extensive review of multiple databases, frequent neurological assessment, discussion with family, other specialists and medical decision making of high complexity.I have made any additions or clarifications directly to  the above note.This critical care time does not reflect procedure time, or teaching time or supervisory time of PA/NP/Med Resident etc but could involve care discussion time.  I spent 40 minutes of neurocritical care time  in the care of  this patient.       Antony Contras, MD Medical Director [facility] Stroke Center Pager: [phone] 01/22/2022 12:48 PM   To contact Stroke Continuity provider, please refer to [email or web address] After hours, contact General Neurology

## 2022-01-22 NOTE — Evaluation (Addendum)
Physical Therapy Evaluation Patient Details Name: Cindy Osborne MRN: [id number] DOB: 1965/10/22 Today's Date: 01/22/2022  History of Present Illness  56 yo female admited 6/10 with L side weakness, found to have have R M1 occlusion. Pt now s/p R MCA revasularization thrombectomy TICI 3 on 6/10. PMH includes: Bil BKA, DM, HLD, HTN.   Clinical Impression  Pt in bed upon arrival of PT, agreeable to evaluation at this time. Prior to admission the pt was ambulating with modified independence (using cane and bilateral prosthetics), living alone, driving, and independent with all ADLs and IADLs. The pt now presents with mild limitations in functional mobility, power, and dynamic stability due to above dx, but is safe to return home with intermittent family support when medically stable for d/c. The pt was able to complete all bed mobility independently, don prosthetics with good seated balance and no assist, and complete 200 ft hallway ambulation with gait that pt reports is her baseline. Will continue to follow acutely to maintain mobility during admission, do not anticipate follow up therapy needs.         Recommendations for follow up therapy are one component of a multi-disciplinary discharge planning process, led by the attending physician.  Recommendations may be updated based on patient status, additional functional criteria and insurance authorization.  Follow Up Recommendations No PT follow up    Assistance Recommended at Discharge Intermittent Supervision/Assistance  Patient can return home with the following  A little help with bathing/dressing/bathroom;Assistance with cooking/housework;Help with stairs or ramp for entrance    Equipment Recommendations None recommended by PT  Recommendations for Other Services       Functional Status Assessment Patient has had a recent decline in their functional status and demonstrates the ability to make significant improvements in function in a  reasonable and predictable amount of time.     Precautions / Restrictions Precautions Precautions: Fall Precaution Comments: HD TTHS Required Braces or Orthoses: Other Brace (bil prosthetic) Restrictions Weight Bearing Restrictions: No      Mobility  Bed Mobility Overal bed mobility: Independent                  Transfers Overall transfer level: Independent                 General transfer comment: has a widen base of support for standing but able to use correct hand placement and power up. cued for RW slightly closer.    Ambulation/Gait Ambulation/Gait assistance: Min guard, Supervision Gait Distance (Feet): 200 Feet Assistive device: Rolling walker (2 wheels) Gait Pattern/deviations: Step-through pattern, Decreased stride length Gait velocity: decreased but pt reports baseline Gait velocity interpretation: <1.31 ft/sec, indicative of household ambulator   General Gait Details: pt with slow but steady gait, states she is near her baseline. Fatigued after 100 ft and took short standing rest break. No overt LOB, progressed to supervision   Modified Rankin (Stroke Patients Only) Modified Rankin (Stroke Patients Only) Pre-Morbid Rankin Score: Slight disability Modified Rankin: Moderate disability     Balance Overall balance assessment: Modified Independent                                           Pertinent Vitals/Pain Pain Assessment Pain Assessment: No/denies pain    Home Living Family/patient expects to be discharged to:: Private residence Living Arrangements: Alone Available Help at Discharge: Family Type of Home: House  Home Access: Ramped entrance       Home Layout: One level Home Equipment: Conservation officer, nature (2 wheels);Cane - single point;Tub bench;Wheelchair - manual;Grab bars - tub/shower;BSC/3in1;Adaptive equipment (has hand held shower if needed) Additional Comments: having bathroom redone    Prior Function Prior Level  of Function : Independent/Modified Independent;Driving             Mobility Comments: uses a cane, no recent falls ADLs Comments: pt reports full independence     Hand Dominance   Dominant Hand: Right    Extremity/Trunk Assessment   Upper Extremity Assessment Upper Extremity Assessment: Overall WFL for tasks assessed    Lower Extremity Assessment Lower Extremity Assessment: Overall WFL for tasks assessed RLE Deficits / Details: BKA with prosthetic, no sensory deficits or weakness in hips, able to fully extend knee LLE Deficits / Details: BKA with prosthetic, no sensory deficits or weakness in hips, able to fully extend knee    Cervical / Trunk Assessment Cervical / Trunk Assessment: Normal  Communication   Communication: No difficulties  Cognition Arousal/Alertness: Awake/alert Behavior During Therapy: WFL for tasks assessed/performed Overall Cognitive Status: Within Functional Limits for tasks assessed                                          General Comments General comments (skin integrity, edema, etc.): VSS with all activity        Assessment/Plan    PT Assessment Patient needs continued PT services  PT Problem List Decreased strength;Decreased range of motion;Decreased activity tolerance;Decreased balance;Decreased mobility;Decreased coordination;Decreased cognition       PT Treatment Interventions DME instruction;Stair training;Gait training;Functional mobility training;Therapeutic activities;Therapeutic exercise;Balance training;Patient/family education    PT Goals (Current goals can be found in the Care Plan section)  Acute Rehab PT Goals Patient Stated Goal: return home PT Goal Formulation: With patient Time For Goal Achievement: 02/05/22 Potential to Achieve Goals: Good    Frequency Min 3X/week        AM-PAC PT "6 Clicks" Mobility  Outcome Measure Help needed turning from your back to your side while in a flat bed without  using bedrails?: None Help needed moving from lying on your back to sitting on the side of a flat bed without using bedrails?: None Help needed moving to and from a bed to a chair (including a wheelchair)?: A Little Help needed standing up from a chair using your arms (e.g., wheelchair or bedside chair)?: A Little Help needed to walk in hospital room?: A Little Help needed climbing 3-5 steps with a railing? : A Little 6 Click Score: 20    End of Session Equipment Utilized During Treatment: Gait belt Activity Tolerance: Patient tolerated treatment well Patient left: in chair;with call bell/phone within reach Nurse Communication: Mobility status PT Visit Diagnosis: Other abnormalities of gait and mobility (R26.89)    Time: 7026-3785 PT Time Calculation (min) (ACUTE ONLY): 38 min   Charges:   PT Evaluation $PT Eval Low Complexity: 1 Low PT Treatments $Therapeutic Exercise: 8-22 mins        [place], PT, DPT   Acute Rehabilitation Department  Sandra Cockayne 01/22/2022, 10:00 AM

## 2022-01-22 NOTE — Consult Note (Addendum)
   NAME:  Cindy Osborne, MRN:  [id number], DOB:  06-23-1966, LOS: 1 ADMISSION DATE:  01/21/2022, CONSULTATION DATE:  01/21/2022 REFERRING MD:  Leonel Ramsay, CHIEF COMPLAINT:  acute stroke   History of Present Illness:  56 year old woman who developed sudden left sided weakness while waiting for HD.  Arrived via EMS - CT showed acute right M1 stroke Mechanically revascularized.   Pertinent  Medical History   Past Medical History:  Diagnosis Date   Amputated toe ([place]) 01/31/2012   Right second toe distal phalanx Right great toe   Diabetes mellitus without complication ([place])    History of diabetic ulcer of foot 04/01/2014   Hyperlipidemia    Hypertension     Significant Hospital Events: Including procedures, antibiotic start and stop dates in addition to other pertinent events   6/10 Distal Rt M1 occlusion, Mechanical thrombectomy was performed by an ADAPT(A direct Aspiration First Pass Technique) approach with TICI 3 results at the first pass  Interim History / Subjective:  Dysarthria has completely resolved. Patient still feels left face is a little weak. Function of left hand back to normal  Objective   Blood pressure 116/73, pulse 73, temperature 97.8 F (36.6 C), temperature source Oral, resp. rate 16, weight 111.6 kg, SpO2 91 %.        Intake/Output Summary (Last 24 hours) at 01/22/2022 0824 Last data filed at 01/22/2022 0600 Gross per 24 hour  Intake 789.71 ml  Output 20 ml  Net 769.71 ml    Filed Weights   01/21/22 1100  Weight: 111.6 kg    Examination: General: Obese, no distress.  HENT: Moist mucosae with no carotid bruits Lungs: clear Cardiovascular: HS are normal  Abdomen: soft Extremities: bilateral BKA Neuro: awake and answers appropriately, no focal deficits, speech now completely fluent.  GU: Fistula for HD  Ancillary tests   Low HDL on lipid panel.   Assessment & Plan:  Arterial ischemic stroke, MCA (middle cerebral artery), right, acute  (HCC) Deficits have essentially completely resolved.  - Secondary stroke prevention: ASA, Brillinta, statin - Echo pending.  - MRI at 24h mark - Arrhythmia monitoring for afib. - Swallow evaluation.   CKD (chronic kidney disease), stage V (HCC) Due to DM and HTN Makes some urine   - For HD today.   Hypertension Currently requiring Cleviprex to maintain goal BP 130-150 following intervention.   - Hold oral BP medications on dialysis days as patient does as OP.  Ready for transfer. PCCM will sign off  Best Practice (right click and "Reselect all SmartList Selections" daily)   Diet/type: Regular consistency (see orders) DVT prophylaxis: other DAPT GI prophylaxis: N/A Lines: Arterial Line and No longer needed.  Order written to d/c  Foley:  N/A Code Status:  full code Last date of multidisciplinary goals of care discussion [6/10, patient updated]   Kipp Brood, MD [facility] ICU Physician [place]  Pager: [phone] Mobile: [phone] After hours: [phone].

## 2022-01-22 NOTE — Progress Notes (Signed)
  2D Echocardiogram has been performed.  Merrie Roof F 01/22/2022, 2:37 PM

## 2022-01-22 NOTE — Evaluation (Signed)
Occupational Therapy Evaluation Patient Details Name: Cindy Osborne MRN: [id number] DOB: August 19, 1965 Today's Date: 01/22/2022   History of Present Illness 56 yo female admited 6/10 L side weakness would to have R M1 occlusion / R MCA 6/10 revasularization thrombectomy  TICI 3 results PMH Bil BKA, DM, HLD, HTN,   Clinical Impression   Patient evaluated by Occupational Therapy with no further acute OT needs identified. All education has been completed and the patient has no further questions. See below for any follow-up Occupational Therapy or equipment needs. OT to sign off. Thank you for referral.        Recommendations for follow up therapy are one component of a multi-disciplinary discharge planning process, led by the attending physician.  Recommendations may be updated based on patient status, additional functional criteria and insurance authorization.   Follow Up Recommendations  No OT follow up    Assistance Recommended at Discharge None  Patient can return home with the following      Functional Status Assessment  Patient has had a recent decline in their functional status and demonstrates the ability to make significant improvements in function in a reasonable and predictable amount of time.  Equipment Recommendations  None recommended by OT    Recommendations for Other Services       Precautions / Restrictions Precautions Precautions: Fall Precaution Comments: HD TTHS Required Braces or Orthoses: Other Brace (bil prosthetic)      Mobility Bed Mobility Overal bed mobility: Independent                  Transfers Overall transfer level: Independent                 General transfer comment: has a widen base of support for standing but able to use correct hand placement and power up. cued for RW slightly closer.      Balance Overall balance assessment: Modified Independent                                         ADL either  performed or assessed with clinical judgement   ADL Overall ADL's : Modified independent                                             Vision Baseline Vision/History: 1 Wears glasses Additional Comments: readers, had prescriptions glasses     Perception     Praxis      Pertinent Vitals/Pain Pain Assessment Pain Assessment: No/denies pain     Hand Dominance Right   Extremity/Trunk Assessment Upper Extremity Assessment Upper Extremity Assessment: Overall WFL for tasks assessed   Lower Extremity Assessment Lower Extremity Assessment: Defer to PT evaluation;RLE deficits/detail;LLE deficits/detail RLE Deficits / Details: BKA with prosthetic LLE Deficits / Details: BKA with prosthetic   Cervical / Trunk Assessment Cervical / Trunk Assessment: Normal   Communication Communication Communication: No difficulties   Cognition Arousal/Alertness: Awake/alert Behavior During Therapy: WFL for tasks assessed/performed Overall Cognitive Status: Within Functional Limits for tasks assessed                                       General Comments  VSS    Exercises  Shoulder Instructions      Home Living Family/patient expects to be discharged to:: Private residence Living Arrangements: Alone Available Help at Discharge: Family Type of Home: House Home Access: [place]: One level     Bathroom Shower/Tub: Teacher, early years/pre: [place]: Conservation officer, nature (2 wheels);Cane - single point;Tub bench;Wheelchair - manual;Grab bars - tub/shower;BSC/3in1;Adaptive equipment (has hand held shower if needed) Adaptive Equipment: Long-handled sponge;Reacher Additional Comments: having bathroom redone      Prior Functioning/Environment Prior Level of Function : Independent/Modified Independent;Driving             Mobility Comments: uses a cane          OT Problem List:        OT  Treatment/Interventions:      OT Goals(Current goals can be found in the care plan section) Acute Rehab OT Goals Patient Stated Goal: to return home Potential to Achieve Goals: Good  OT Frequency:      Co-evaluation              AM-PAC OT "6 Clicks" Daily Activity     Outcome Measure Help from another person eating meals?: None Help from another person taking care of personal grooming?: None Help from another person toileting, which includes using toliet, bedpan, or urinal?: None Help from another person bathing (including washing, rinsing, drying)?: None Help from another person to put on and taking off regular upper body clothing?: None Help from another person to put on and taking off regular lower body clothing?: None 6 Click Score: 24   End of Session Equipment Utilized During Treatment: Gait belt;Rolling walker (2 wheels) Nurse Communication: Mobility status  Activity Tolerance: Patient tolerated treatment well Patient left: Other (comment) (up with PT Katie)                   Time: 5784-6962 OT Time Calculation (min): 27 min Charges:  OT General Charges $OT Visit: 1 Visit OT Evaluation $OT Eval Moderate Complexity: 1 Mod   Brynn, OTR/L  Acute Rehabilitation Services Office: [phone] .   Jeri Modena 01/22/2022, 9:45 AM

## 2022-01-22 NOTE — Progress Notes (Signed)
NIR follow up. Pt currently off unit for imaging. Per RN and team notes, doing very well. Minimal residual neuro deficits. No groin issues/hematoma per RN.  NIR will cont following.  Ascencion Dike PA-C Interventional Radiology 01/22/2022 12:42 PM

## 2022-01-23 LAB — RENAL FUNCTION PANEL
Albumin: 3.4 g/dL — ABNORMAL LOW (ref 3.5–5.0)
Anion gap: 16 — ABNORMAL HIGH (ref 5–15)
BUN: 71 mg/dL — ABNORMAL HIGH (ref 6–20)
CO2: 25 mmol/L (ref 22–32)
Calcium: 9.8 mg/dL (ref 8.9–10.3)
Chloride: 94 mmol/L — ABNORMAL LOW (ref 98–111)
Creatinine, Ser: 11.33 mg/dL — ABNORMAL HIGH (ref 0.44–1.00)
GFR, Estimated: 4 mL/min — ABNORMAL LOW (ref >60)
Glucose, Bld: 85 mg/dL (ref 70–99)
Phosphorus: 7.1 mg/dL — ABNORMAL HIGH (ref 2.5–4.6)
Potassium: 4.6 mmol/L (ref 3.5–5.1)
Sodium: 135 mmol/L (ref 135–145)

## 2022-01-23 LAB — CBC WITH DIFFERENTIAL/PLATELET
Abs Immature Granulocytes: 0.03 10*3/uL (ref 0.00–0.07)
Basophils Absolute: 0 10*3/uL (ref 0.0–0.1)
Basophils Relative: 1 %
Eosinophils Absolute: 0.5 10*3/uL (ref 0.0–0.5)
Eosinophils Relative: 6 %
HCT: 32.7 % — ABNORMAL LOW (ref 36.0–46.0)
Hemoglobin: 10 g/dL — ABNORMAL LOW (ref 12.0–15.0)
Immature Granulocytes: 0 %
Lymphocytes Relative: 33 %
Lymphs Abs: 2.7 10*3/uL (ref 0.7–4.0)
MCH: 28.3 pg (ref 26.0–34.0)
MCHC: 30.6 g/dL (ref 30.0–36.0)
MCV: 92.6 fL (ref 80.0–100.0)
Monocytes Absolute: 0.4 10*3/uL (ref 0.1–1.0)
Monocytes Relative: 5 %
Neutro Abs: 4.6 10*3/uL (ref 1.7–7.7)
Neutrophils Relative %: 55 %
Platelets: 176 10*3/uL (ref 150–400)
RBC: 3.53 MIL/uL — ABNORMAL LOW (ref 3.87–5.11)
RDW: 15.8 % — ABNORMAL HIGH (ref 11.5–15.5)
WBC: 8.3 10*3/uL (ref 4.0–10.5)
nRBC: 0 % (ref 0.0–0.2)

## 2022-01-23 LAB — GLUCOSE, CAPILLARY
Glucose-Capillary: 121 mg/dL — ABNORMAL HIGH (ref 70–99)
Glucose-Capillary: 77 mg/dL (ref 70–99)
Glucose-Capillary: 87 mg/dL (ref 70–99)
Glucose-Capillary: 109 mg/dL — ABNORMAL HIGH (ref 70–99)
Glucose-Capillary: 123 mg/dL — ABNORMAL HIGH (ref 70–99)

## 2022-01-23 MED ORDER — ASPIRIN 81 MG PO CHEW
81.0000 mg | CHEWABLE_TABLET | Freq: Every day | ORAL | Status: DC
  Administered 2022-01-23 – 2022-01-24 (×2): 81 mg via ORAL
  Filled 2022-01-23 (×2): qty 1

## 2022-01-23 MED ORDER — CLOPIDOGREL BISULFATE 75 MG PO TABS
75.0000 mg | ORAL_TABLET | Freq: Every day | ORAL | Status: DC
  Administered 2022-01-23 – 2022-01-24 (×2): 75 mg via ORAL
  Filled 2022-01-23 (×2): qty 1

## 2022-01-23 MED ORDER — CHLORHEXIDINE GLUCONATE CLOTH 2 % EX PADS
6.0000 | MEDICATED_PAD | Freq: Every day | CUTANEOUS | Status: DC
  Administered 2022-01-24: 6 via TOPICAL

## 2022-01-23 MED ORDER — AMITRIPTYLINE HCL 25 MG PO TABS
25.0000 mg | ORAL_TABLET | Freq: Every day | ORAL | Status: DC
  Administered 2022-01-23 – 2022-01-24 (×2): 25 mg via ORAL
  Filled 2022-01-23 (×2): qty 1

## 2022-01-23 NOTE — Progress Notes (Signed)
  Transition of Care [facility]) Screening Note   Patient Details  Name: Cindy Osborne Date of Birth: April 09, 1966   Transition of Care [facility]) CM/SW Contact:    Ella Bodo, RN Phone Number: 01/23/2022, 4:18 PM    Transition of Care Department [facility]) has reviewed patient and no TOC needs have been identified at this time. We will continue to monitor patient advancement through interdisciplinary progression rounds. If new patient transition needs arise, please place a TOC consult.  Reinaldo Raddle, RN, BSN  Trauma/Neuro ICU Case Manager [phone]

## 2022-01-23 NOTE — Progress Notes (Signed)
PT Cancellation Note  Patient Details Name: Cindy Osborne MRN: [id number] DOB: [birth year]   Cancelled Treatment:    Reason Eval/Treat Not Completed: Medical issues which prohibited therapy  Patient reports she feels too short of breath to ambulate. States she has not had dialysis since Thursday and knows she will be breathing/feeling better after dialysis today. Agrees to walk with nursing after dialysis today.   [place]  Office [phone]  Rexanne Mano 01/23/2022, 12:49 PM

## 2022-01-23 NOTE — Progress Notes (Signed)
Referring Physician(s): Stroke Team   Supervising Physician: Glori Bickers MD  Patient Status:  [facility] - In-pt  Chief Complaint:  Rt M1 Stroke  Subjective:  Patient is 56 yo female with PMH significant for DM, HTN, hyperlipidemia, and hx of bilateral BKAs who presented on 6/10 for stroke-like symptoms. CT revealed a Rt distal M1 occlusion, and pt underwent  mechanical thrombectomy on 01/21/22. Pt was alert and sitting upright in bed at time of exam.  Allergies: Augmentin [amoxicillin-pot clavulanate], Atorvastatin, Pioglitazone, Peanut-containing drug products, Rosuvastatin, Shellfish-derived products, and Sulfa antibiotics  Medications: Prior to Admission medications   Medication Sig Start Date End Date Taking? Authorizing Provider  acetaminophen (TYLENOL) 500 MG tablet Take 1,000 mg by mouth at bedtime as needed (pain).   Yes [provider]  albuterol (VENTOLIN HFA) 108 (90 Base) MCG/ACT inhaler TAKE 2 PUFFS BY MOUTH EVERY 6 HOURS AS NEEDED FOR WHEEZE OR SHORTNESS OF BREATH Patient taking differently: Inhale 2 puffs into the lungs 4 (four) times daily as needed for wheezing or shortness of breath (during allergy season). 11/07/21  Yes Glean Hess, MD  amitriptyline (ELAVIL) 25 MG tablet Take 1 tablet (25 mg total) by mouth at bedtime. 12/01/21  Yes Glean Hess, MD  amLODipine (NORVASC) 10 MG tablet Take 10 mg by mouth See admin instructions. Take 10 mg by mouth on Monday, Wednesday, Friday, and Sunday   Yes [provider]  ASPIRIN LOW DOSE 81 MG EC tablet TAKE 1 TABLET BY MOUTH EVERY DAY Patient taking differently: Take 81 mg by mouth daily. 10/28/21  Yes Glean Hess, MD  calcitRIOL (ROCALTROL) 0.5 MCG capsule Take 0.5 mcg by mouth See admin instructions. Given at dialysis   Yes [provider]  cinacalcet (SENSIPAR) 30 MG tablet Take 30 mg by mouth See admin instructions. Given at dialysis   Yes [provider]  fexofenadine  (ALLEGRA) 180 MG tablet Take 180 mg by mouth daily.   Yes [provider]  fluticasone (FLONASE) 50 MCG/ACT nasal spray Place 2 sprays into both nostrils daily. Patient taking differently: Place 2 sprays into both nostrils daily as needed for allergies. 10/26/20  Yes Glean Hess, MD  insulin aspart (NOVOLOG) 100 UNIT/ML FlexPen Inject 2-3 Units into the skin daily as needed for high blood sugar. 10/18/18 01/22/22 Yes [provider]  Insulin Glargine (BASAGLAR KWIKPEN [place]) Inject 10-12 Units into the skin daily.   Yes [provider]  levonorgestrel (MIRENA) 20 MCG/24HR IUD by Intrauterine route.   Yes [provider]  metoprolol (TOPROL-XL) 200 MG 24 hr tablet Take 200 mg by mouth See admin instructions. Take 200 mg by mouth on Monday, Wednesday, Friday, and Sunday 04/02/20  Yes [provider]  montelukast (SINGULAIR) 10 MG tablet TAKE 1 TABLET BY MOUTH EVERYDAY AT BEDTIME Patient taking differently: Take 10 mg by mouth at bedtime as needed (allergies). 10/25/21  Yes Glean Hess, MD  pravastatin (PRAVACHOL) 20 MG tablet TAKE 1 TABLET BY MOUTH EVERY DAY Patient taking differently: Take 20 mg by mouth at bedtime. 03/26/21  Yes Glean Hess, MD  PRESCRIPTION MEDICATION 200 mg See admin instructions. 200 mg every 2 weeks Given at dialysis   Yes [provider]  sucroferric oxyhydroxide (VELPHORO) 500 MG chewable tablet Chew 2,000 mg by mouth daily.   Yes [provider]  torsemide (DEMADEX) 100 MG tablet Take 100 mg by mouth See admin instructions. Take 100 mg by mouth twice a day on  Monday, Wednesday, Friday, and Sunday 11/27/21  Yes [provider]  triamcinolone cream (KENALOG) 0.1 % Apply 1 application topically 2 (two) times daily. To rash on leg Patient taking differently: Apply 1 application  topically See admin instructions. 1 application to rash on leg 1-2 times a week 03/03/21  Yes Berglund, Laura H, MD   furosemide (LASIX) 80 MG tablet Take 80 mg by mouth every morning. Patient not taking: Reported on 01/22/2022 12/26/20   [provider]  mupirocin ointment (BACTROBAN) 2 % mupirocin 2 % topical ointment Patient not taking: Reported on 01/22/2022    [provider]  sennosides-docusate sodium (SENOKOT-S) 8.6-50 MG tablet Take 1 tablet by mouth as needed for constipation. Patient not taking: Reported on 01/22/2022    [provider]  sodium bicarbonate 650 MG tablet Take by mouth 3 (three) times daily. Patient not taking: Reported on 01/22/2022 11/14/14   [provider]     Vital Signs: BP 139/81   Pulse 73   Temp 97.9 F (36.6 C) (Oral)   Resp 19   Wt 111.6 kg   SpO2 95%   BMI 36.33 kg/m   Physical Exam HENT:     Head: Normocephalic.  Eyes:     Pupils: Pupils are equal, round, and reactive to light.  Cardiovascular:     Rate and Rhythm: Normal rate.     Pulses:          Popliteal pulses are 2+ on the right side.  Pulmonary:     Effort: Pulmonary effort is normal.  Musculoskeletal:     Comments: Rt groin non-tender and soft at site of puncture. Site was clean, dry, and intact.   Skin:    General: Skin is warm and dry.  Neurological:     Mental Status: She is alert and oriented to person, place, and time.     Cranial Nerves: No facial asymmetry.     Motor: Motor function is intact. No weakness or pronator drift.     Coordination: Coordination is intact.     Comments: Motor strength 5/5 in all extremities. Fine motor intact. Bilateral BKAs noted.  Psychiatric:        Mood and Affect: Mood normal.        Behavior: Behavior normal.        Thought Content: Thought content normal.        Judgment: Judgment normal.     Imaging: ECHOCARDIOGRAM COMPLETE  Result Date: 01/22/2022    ECHOCARDIOGRAM REPORT   Patient Name:   Erandi Mcglinchey Date of Exam: 01/22/2022 Medical Rec #:  [id number]         Height:       69.0 in Accession #:     [id number]        Weight:       246.0 lb Date of Birth:  01/10/1966          BSA:          2.256 m Patient Age:    55 years          BP:           122/61 mmHg Patient Gender: F                 HR:           72  bpm. Exam Location:  Inpatient Procedure: 2D Echo, Cardiac Doppler and Color Doppler Indications:    Stroke  History:        Patient  has prior history of Echocardiogram examinations, most                 recent 10/31/2021.  Sonographer:    Merrie Roof RDCS Referring Phys: [phone] MCNEILL P [place]  1. Left ventricular ejection fraction, by estimation, is 55 to 60%. The left ventricle has normal function. The left ventricle has no regional wall motion abnormalities. The left ventricular internal cavity size was mildly dilated. There is moderate concentric left ventricular hypertrophy. Left ventricular diastolic parameters are consistent with Grade II diastolic dysfunction (pseudonormalization). Elevated left ventricular end-diastolic pressure.  2. Right ventricular systolic function is normal. The right ventricular size is mildly enlarged. Tricuspid regurgitation signal is inadequate for assessing PA pressure.  3. Left atrial size was moderately dilated.  4. The mitral valve is abnormal. Mild to moderate mitral valve regurgitation. No evidence of mitral stenosis. Moderate to severe mitral annular calcification.  5. The aortic valve is tricuspid. There is moderate calcification of the aortic valve. There is mild thickening of the aortic valve. Aortic valve regurgitation is not visualized. Mild aortic valve stenosis.  6. The inferior vena cava is normal in size with greater than 50% respiratory variability, suggesting right atrial pressure of 3 mmHg. Comparison(s): Prior images unable to be directly viewed, comparison made by report only. [facility] echo 10/31/21: EF 55%, moderate LVH, mild MR, trivial TR, RVSP 54 mmHg. Conclusion(s)/Recommendation(s): No intracardiac source of embolism detected on this  transthoracic study. Consider a transesophageal echocardiogram to exclude cardiac source of embolism if clinically indicated. FINDINGS  Left Ventricle: Left ventricular ejection fraction, by estimation, is 55 to 60%. The left ventricle has normal function. The left ventricle has no regional wall motion abnormalities. The left ventricular internal cavity size was mildly dilated. There is  moderate concentric left ventricular hypertrophy. Left ventricular diastolic parameters are consistent with Grade II diastolic dysfunction (pseudonormalization). Elevated left ventricular end-diastolic pressure. Right Ventricle: The right ventricular size is mildly enlarged. Right vetricular wall thickness was not well visualized. Right ventricular systolic function is normal. Tricuspid regurgitation signal is inadequate for assessing PA pressure. Left Atrium: Left atrial size was moderately dilated. Right Atrium: Right atrial size was normal in size. Pericardium: There is no evidence of pericardial effusion. Mitral Valve: The mitral valve is abnormal. There is moderate thickening of the mitral valve leaflet(s). There is moderate calcification of the mitral valve leaflet(s). Moderate to severe mitral annular calcification. Mild to moderate mitral valve regurgitation. No evidence of mitral valve stenosis. Tricuspid Valve: The tricuspid valve is grossly normal. Tricuspid valve regurgitation is trivial. No evidence of tricuspid stenosis. Aortic Valve: The aortic valve is tricuspid. There is moderate calcification of the aortic valve. There is mild thickening of the aortic valve. Aortic valve regurgitation is not visualized. Mild aortic stenosis is present. Aortic valve mean gradient measures 9.0 mmHg. Aortic valve peak gradient measures 15.7 mmHg. Aortic valve area, by VTI measures 1.40 cm. Pulmonic Valve: The pulmonic valve was not well visualized. Pulmonic valve regurgitation is trivial. No evidence of pulmonic stenosis. Aorta: The  aortic root and ascending aorta are structurally normal, with no evidence of dilitation. Venous: The inferior vena cava is normal in size with greater than 50% respiratory variability, suggesting right atrial pressure of 3 mmHg. IAS/Shunts: The interatrial septum was not well visualized.  LEFT VENTRICLE PLAX 2D LVIDd:         5.30 cm   Diastology LVIDs:         3.90 cm   LV e'  medial:    5.70 cm/s LV PW:         1.40 cm   LV E/e' medial:  28.2 LV IVS:        1.20 cm   LV e' lateral:   6.30 cm/s LVOT diam:     2.00 cm   LV E/e' lateral: 25.6 LV SV:         59 LV SV Index:   26 LVOT Area:     3.14 cm  RIGHT VENTRICLE             IVC RV Basal diam:  4.50 cm     IVC diam: 2.00 cm RV S prime:     11.40 cm/s TAPSE (M-mode): 2.4 cm LEFT ATRIUM              Index        RIGHT ATRIUM           Index LA diam:        5.00 cm  2.22 cm/m   RA Area:     18.20 cm LA Vol (A2C):   109.0 ml 48.31 ml/m  RA Volume:   52.10 ml  23.09 ml/m LA Vol (A4C):   68.9 ml  30.54 ml/m LA Biplane Vol: 89.6 ml  39.71 ml/m  AORTIC VALVE AV Area (Vmax):    1.36 cm AV Area (Vmean):   1.29 cm AV Area (VTI):     1.40 cm AV Vmax:           198.00 cm/s AV Vmean:          138.000 cm/s AV VTI:            0.423 m AV Peak Grad:      15.7 mmHg AV Mean Grad:      9.0 mmHg LVOT Vmax:         85.40 cm/s LVOT Vmean:        56.600 cm/s LVOT VTI:          0.188 m LVOT/AV VTI ratio: 0.44  AORTA Ao Root diam: 3.00 cm Ao Asc diam:  3.40 cm MITRAL VALVE MV Area (PHT): 3.02 cm       SHUNTS MV Decel Time: 251 msec       Systemic VTI:  0.19 m MR Peak grad:    91.4 mmHg    Systemic Diam: 2.00 cm MR Mean grad:    60.0 mmHg MR Vmax:         478.00 cm/s MR Vmean:        364.0 cm/s MR PISA:         2.26 cm MR PISA Eff ROA: 16 mm MR PISA Radius:  0.60 cm MV E velocity: 161.00 cm/s MV A velocity: 83.80 cm/s MV E/A ratio:  1.92 Buford Dresser MD Electronically signed by Buford Dresser MD Signature Date/Time: 01/22/2022/3:55:40 PM    Final    MR BRAIN WO  CONTRAST  Result Date: 01/22/2022 CLINICAL DATA:  Stroke follow-up. Acute onset left-sided weakness and sensory deficit at dialysis appointment EXAM: MRI HEAD WITHOUT CONTRAST MRA HEAD WITHOUT CONTRAST TECHNIQUE: Multiplanar, multi-echo pulse sequences of the brain and surrounding structures were acquired without intravenous contrast. Angiographic images of the Circle of Willis were acquired using MRA technique without intravenous contrast. COMPARISON:  Head CT and CTA from yesterday. FINDINGS: MRI HEAD FINDINGS Brain: Scattered areas of acute cortical infarction in the right insula, posterior temporal, and frontal cortex, overall small volume. Mild white matter infarction  in the right occipital parietal region. No acute hemorrhage, hydrocephalus, or collection. Left parietal white matter FLAIR hyperintensity likely from old ischemic insult. Vascular: Arterial findings below. Skull and upper cervical spine: Normal marrow signal Sinuses/Orbits: Negative MRA HEAD FINDINGS Anterior circulation: Atheromatous irregularity of the carotid siphons. Re-established flow in the right MCA at site of previously seen embolism. No downstream or contralateral embolic disease noted. Negative for aneurysm. Posterior circulation: Codominant vertebral arteries. The vertebral and basilar arteries are smoothly contoured and widely patent. No branch occlusion, beading, or aneurysm IMPRESSION: Brain MRI: Small areas of acute infarction in the right MCA distribution, primarily cortical and limited in extent when compared to the level of prior occlusion. MRA: 1. Normalized appearance of the right MCA bifurcation after embolectomy. No evidence of interval embolic disease. 2. Atheromatous irregularity of the carotid siphons. Electronically Signed   By: Jorje Guild M.D.   On: 01/22/2022 12:26   MR ANGIO HEAD WO CONTRAST  Result Date: 01/22/2022 CLINICAL DATA:  Stroke follow-up. Acute onset left-sided weakness and sensory deficit at  dialysis appointment EXAM: MRI HEAD WITHOUT CONTRAST MRA HEAD WITHOUT CONTRAST TECHNIQUE: Multiplanar, multi-echo pulse sequences of the brain and surrounding structures were acquired without intravenous contrast. Angiographic images of the Circle of Willis were acquired using MRA technique without intravenous contrast. COMPARISON:  Head CT and CTA from yesterday. FINDINGS: MRI HEAD FINDINGS Brain: Scattered areas of acute cortical infarction in the right insula, posterior temporal, and frontal cortex, overall small volume. Mild white matter infarction in the right occipital parietal region. No acute hemorrhage, hydrocephalus, or collection. Left parietal white matter FLAIR hyperintensity likely from old ischemic insult. Vascular: Arterial findings below. Skull and upper cervical spine: Normal marrow signal Sinuses/Orbits: Negative MRA HEAD FINDINGS Anterior circulation: Atheromatous irregularity of the carotid siphons. Re-established flow in the right MCA at site of previously seen embolism. No downstream or contralateral embolic disease noted. Negative for aneurysm. Posterior circulation: Codominant vertebral arteries. The vertebral and basilar arteries are smoothly contoured and widely patent. No branch occlusion, beading, or aneurysm IMPRESSION: Brain MRI: Small areas of acute infarction in the right MCA distribution, primarily cortical and limited in extent when compared to the level of prior occlusion. MRA: 1. Normalized appearance of the right MCA bifurcation after embolectomy. No evidence of interval embolic disease. 2. Atheromatous irregularity of the carotid siphons. Electronically Signed   By: Jorje Guild M.D.   On: 01/22/2022 12:26   IR PERCUTANEOUS ART THROMBECTOMY/INFUSION INTRACRANIAL INC DIAG ANGIO  Result Date: 01/21/2022 INDICATION: Patient with MCA syndrome, distal M1 occlusion, left hemiplegia EXAM: 1. EMERGENT LARGE VESSEL OCCLUSION THROMBOLYSIS (ANTERIOR CIRCULATION) 2. MECHANICAL  THROMBECTOMY OF THE DISTAL M1 OCCLUSION BY THE ADAPT (A DIRECT ASPIRATION FIRST PASS TECHNIQUE) TECHNIQUE COMPARISON:  Correlation is made with a CTA of the head and neck performed on the same day earlier MEDICATIONS: Injection Ancef 2 g was administered within 1 hour of the procedure ANESTHESIA/SEDATION: General anesthesia CONTRAST:  60 mL of Omnipaque 300 FLUOROSCOPY TIME:  Fluoroscopy Time: 10 minutes 42 seconds (581 mGy). COMPLICATIONS: None immediate. TECHNIQUE: As the patient's family was not available and patient was not in a position to understand and consent for the procedure, procedure was performed under an emergent consent. The patient was then put under general anesthesia by the Department of Anesthesiology at [facility]. The right groin was prepped and draped in the usual sterile fashion. Thereafter using modified Seldinger technique, transfemoral access into the right common femoral artery was obtained without difficulty. Over  a 0.035 inch guidewire a 8 French x 25 cm pinnacle was inserted. Through this, and also over a 0.035 inch glidewire a a walrus balloon guide catheter with a 6 French neuron select catheter was advanced to the aortic arch region and selective catheterization of the right common carotid artery followed by angiogram was performed. Then, under roadmap guidance, the guide catheter was advanced into the distal intracranial internal carotid artery. Next, zoom 55 and phenom 21 microcatheter and synchro 14 microwire were utilized and under roadmap guidance, microcatheter and zoom 55 were advanced into the distal intracranial ICA. Biplane DSA cerebral angiography of the right ICA injection was performed over the head. Then, under roadmap guidance, microwire and microcatheter were navigated into the M1 segment of the right MCA. The microwire was advanced into the M2-M3 segment past the occlusion at the distal M1 segment. Next, zoom 55 was advanced with its distal end at the site  of the occlusion of the distal M1. The microcatheter and wire were removed and the zoom 55 was connected to the penumbra aspiration suction system. After 3 minutes of suction, the zoom 55 catheter was removed the walrus guide catheter was disconnected at the hub, 10 mL of blood was aspirated and then DSA cerebral angiogram was performed. Next, repeat DSA cerebral angiogram was performed after about 10 minutes to confirm the continued patency of the right MCA and its branches. Subsequently, dynamic CT head was performed. Next, the femoral sheath was partially withdrawn with its distal end in the external iliac artery and right femoral angiogram was performed and the femoral access site was closed by using an 8 [place] Angio-Seal device. Patient was then extubated and was transferred to the ICU in stable condition. FINDINGS: Right common carotid angiogram demonstrated common, internal and external carotid arteries to be widely patent without significant stenosis. Mild atheromatous disease. Biplane DSA cerebral angiography demonstrated distal intracranial internal carotid artery to be widely patent. Anterior cerebral artery and its branches have a normal appearance. There is occlusion at the distal M1 immediately distal to the origin of the anterior temporal artery. Post aspiration thrombectomy cerebral angiogram demonstrated complete revascularization with TICI 3 results. The right middle cerebral artery and its branches have a normal appearance. The venous phase of the angiogram is unremarkable with patent superior sagittal, inferior sagittal, transverse, sigmoid sinuses. No aneurysm, av malformation or dural fistula seen. Dyna CT of the head demonstrated no intracranial bleed. IMPRESSION: Successful mechanical thrombectomy for distal right M1 occlusion of the MCA by direct aspiration first pass technique with TICI 3 results. PLAN: Patient will be admitted in the ICU for close monitoring. Systolic blood pressure goal  to be at 120-140 mm Hg. Electronically Signed   By: Frazier Richards M.D.   On: 01/21/2022 15:23   IR CT Head Ltd  Result Date: 01/21/2022 INDICATION: Patient with MCA syndrome, distal M1 occlusion, left hemiplegia EXAM: 1. EMERGENT LARGE VESSEL OCCLUSION THROMBOLYSIS (ANTERIOR CIRCULATION) 2. MECHANICAL THROMBECTOMY OF THE DISTAL M1 OCCLUSION BY THE ADAPT (A DIRECT ASPIRATION FIRST PASS TECHNIQUE) TECHNIQUE COMPARISON:  Correlation is made with a CTA of the head and neck performed on the same day earlier MEDICATIONS: Injection Ancef 2 g was administered within 1 hour of the procedure ANESTHESIA/SEDATION: General anesthesia CONTRAST:  60 mL of Omnipaque 300 FLUOROSCOPY TIME:  Fluoroscopy Time: 10 minutes 42 seconds (581 mGy). COMPLICATIONS: None immediate. TECHNIQUE: As the patient's family was not available and patient was not in a position to understand and consent for the procedure,  procedure was performed under an emergent consent. The patient was then put under general anesthesia by the Department of Anesthesiology at [facility]. The right groin was prepped and draped in the usual sterile fashion. Thereafter using modified Seldinger technique, transfemoral access into the right common femoral artery was obtained without difficulty. Over a 0.035 inch guidewire a 8 French x 25 cm pinnacle was inserted. Through this, and also over a 0.035 inch glidewire a a walrus balloon guide catheter with a 6 French neuron select catheter was advanced to the aortic arch region and selective catheterization of the right common carotid artery followed by angiogram was performed. Then, under roadmap guidance, the guide catheter was advanced into the distal intracranial internal carotid artery. Next, zoom 55 and phenom 21 microcatheter and synchro 14 microwire were utilized and under roadmap guidance, microcatheter and zoom 55 were advanced into the distal intracranial ICA. Biplane DSA cerebral angiography of the right  ICA injection was performed over the head. Then, under roadmap guidance, microwire and microcatheter were navigated into the M1 segment of the right MCA. The microwire was advanced into the M2-M3 segment past the occlusion at the distal M1 segment. Next, zoom 55 was advanced with its distal end at the site of the occlusion of the distal M1. The microcatheter and wire were removed and the zoom 55 was connected to the penumbra aspiration suction system. After 3 minutes of suction, the zoom 55 catheter was removed the walrus guide catheter was disconnected at the hub, 10 mL of blood was aspirated and then DSA cerebral angiogram was performed. Next, repeat DSA cerebral angiogram was performed after about 10 minutes to confirm the continued patency of the right MCA and its branches. Subsequently, dynamic CT head was performed. Next, the femoral sheath was partially withdrawn with its distal end in the external iliac artery and right femoral angiogram was performed and the femoral access site was closed by using an 8 [place] Angio-Seal device. Patient was then extubated and was transferred to the ICU in stable condition. FINDINGS: Right common carotid angiogram demonstrated common, internal and external carotid arteries to be widely patent without significant stenosis. Mild atheromatous disease. Biplane DSA cerebral angiography demonstrated distal intracranial internal carotid artery to be widely patent. Anterior cerebral artery and its branches have a normal appearance. There is occlusion at the distal M1 immediately distal to the origin of the anterior temporal artery. Post aspiration thrombectomy cerebral angiogram demonstrated complete revascularization with TICI 3 results. The right middle cerebral artery and its branches have a normal appearance. The venous phase of the angiogram is unremarkable with patent superior sagittal, inferior sagittal, transverse, sigmoid sinuses. No aneurysm, av malformation or dural  fistula seen. Dyna CT of the head demonstrated no intracranial bleed. IMPRESSION: Successful mechanical thrombectomy for distal right M1 occlusion of the MCA by direct aspiration first pass technique with TICI 3 results. PLAN: Patient will be admitted in the ICU for close monitoring. Systolic blood pressure goal to be at 120-140 mm Hg. Electronically Signed   By: Frazier Richards M.D.   On: 01/21/2022 15:23   IR US Guide Vasc Access Right  Result Date: 01/21/2022 INDICATION: Patient with MCA syndrome, distal M1 occlusion, left hemiplegia EXAM: 1. EMERGENT LARGE VESSEL OCCLUSION THROMBOLYSIS (ANTERIOR CIRCULATION) 2. MECHANICAL THROMBECTOMY OF THE DISTAL M1 OCCLUSION BY THE ADAPT (A DIRECT ASPIRATION FIRST PASS TECHNIQUE) TECHNIQUE COMPARISON:  Correlation is made with a CTA of the head and neck performed on the same day earlier MEDICATIONS: Injection Ancef  2 g was administered within 1 hour of the procedure ANESTHESIA/SEDATION: General anesthesia CONTRAST:  60 mL of Omnipaque 300 FLUOROSCOPY TIME:  Fluoroscopy Time: 10 minutes 42 seconds (581 mGy). COMPLICATIONS: None immediate. TECHNIQUE: As the patient's family was not available and patient was not in a position to understand and consent for the procedure, procedure was performed under an emergent consent. The patient was then put under general anesthesia by the Department of Anesthesiology at [facility]. The right groin was prepped and draped in the usual sterile fashion. Thereafter using modified Seldinger technique, transfemoral access into the right common femoral artery was obtained without difficulty. Over a 0.035 inch guidewire a 8 French x 25 cm pinnacle was inserted. Through this, and also over a 0.035 inch glidewire a a walrus balloon guide catheter with a 6 French neuron select catheter was advanced to the aortic arch region and selective catheterization of the right common carotid artery followed by angiogram was performed. Then, under  roadmap guidance, the guide catheter was advanced into the distal intracranial internal carotid artery. Next, zoom 55 and phenom 21 microcatheter and synchro 14 microwire were utilized and under roadmap guidance, microcatheter and zoom 55 were advanced into the distal intracranial ICA. Biplane DSA cerebral angiography of the right ICA injection was performed over the head. Then, under roadmap guidance, microwire and microcatheter were navigated into the M1 segment of the right MCA. The microwire was advanced into the M2-M3 segment past the occlusion at the distal M1 segment. Next, zoom 55 was advanced with its distal end at the site of the occlusion of the distal M1. The microcatheter and wire were removed and the zoom 55 was connected to the penumbra aspiration suction system. After 3 minutes of suction, the zoom 55 catheter was removed the walrus guide catheter was disconnected at the hub, 10 mL of blood was aspirated and then DSA cerebral angiogram was performed. Next, repeat DSA cerebral angiogram was performed after about 10 minutes to confirm the continued patency of the right MCA and its branches. Subsequently, dynamic CT head was performed. Next, the femoral sheath was partially withdrawn with its distal end in the external iliac artery and right femoral angiogram was performed and the femoral access site was closed by using an 8 [place] Angio-Seal device. Patient was then extubated and was transferred to the ICU in stable condition. FINDINGS: Right common carotid angiogram demonstrated common, internal and external carotid arteries to be widely patent without significant stenosis. Mild atheromatous disease. Biplane DSA cerebral angiography demonstrated distal intracranial internal carotid artery to be widely patent. Anterior cerebral artery and its branches have a normal appearance. There is occlusion at the distal M1 immediately distal to the origin of the anterior temporal artery. Post aspiration  thrombectomy cerebral angiogram demonstrated complete revascularization with TICI 3 results. The right middle cerebral artery and its branches have a normal appearance. The venous phase of the angiogram is unremarkable with patent superior sagittal, inferior sagittal, transverse, sigmoid sinuses. No aneurysm, av malformation or dural fistula seen. Dyna CT of the head demonstrated no intracranial bleed. IMPRESSION: Successful mechanical thrombectomy for distal right M1 occlusion of the MCA by direct aspiration first pass technique with TICI 3 results. PLAN: Patient will be admitted in the ICU for close monitoring. Systolic blood pressure goal to be at 120-140 mm Hg. Electronically Signed   By: Frazier Richards M.D.   On: 01/21/2022 15:23   CT ANGIO HEAD NECK W WO CM (CODE STROKE)  Result Date: 01/21/2022  CLINICAL DATA:  Acute stroke suspected EXAM: CT ANGIOGRAPHY HEAD AND NECK TECHNIQUE: Multidetector CT imaging of the head and neck was performed using the standard protocol during bolus administration of intravenous contrast. Multiplanar CT image reconstructions and MIPs were obtained to evaluate the vascular anatomy. Carotid stenosis measurements (when applicable) are obtained utilizing NASCET criteria, using the distal internal carotid diameter as the denominator. RADIATION DOSE REDUCTION: This exam was performed according to the departmental dose-optimization program which includes automated exposure control, adjustment of the mA and/or kV according to patient size and/or use of iterative reconstruction technique. CONTRAST:  43mL OMNIPAQUE IOHEXOL 350 MG/ML SOLN COMPARISON:  None Available. FINDINGS: CTA NECK FINDINGS Aortic arch: Atheromatous plaque.  Three vessel branching. Right carotid system: Diffuse atheromatous plaque, primarily calcified. No flow limiting stenosis or ulceration of the common and internal carotid arteries. ECA origin stenosis. Left carotid system: Calcified atheromatous plaque without  stenosis or ulceration. Vertebral arteries: No proximal subclavian stenosis. Calcified plaque at the right vertebral origin. No flow limiting stenosis, beading, or dissection. Skeleton: Renal osteodystrophy findings at the medial clavicular heads. Other neck: 13 mm left thyroid nodule. No followup recommended (ref: J Am Coll Radiol. 2015 Feb;12(2): 143-50). Upper chest: At least moderate volume layering right pleural effusion. Review of the MIP images confirms the above findings CTA HEAD FINDINGS Anterior circulation: Abrupt cut off of the right MCA just beyond the anterior temporal branch. Partial downstream reconstitution. No left-sided or anterior cerebral embolic disease is seen. Confluent calcified plaque on the carotid siphons Posterior circulation: The vertebral and basilar arteries are smoothly contoured and diffusely patent. No branch occlusion, beading, or aneurysm. Venous sinuses: Unremarkable for the arterial phase Anatomic variants: None significant Review of the MIP images confirms the above findings Critical Value/emergent results were called by telephone at the time of interpretation on 01/21/2022 at 12:05 pm to provider [facility] , who verbally acknowledged these results. IMPRESSION: 1. Emergent large vessel occlusion at the right M1 segment, just beyond the anterior temporal branch. 2. Atherosclerosis without flow limiting stenosis or embolic source seen in the more proximal neck. 3. Layering right pleural effusion. Electronically Signed   By: Jorje Guild M.D.   On: 01/21/2022 12:10   CT HEAD CODE STROKE WO CONTRAST  Result Date: 01/21/2022 CLINICAL DATA:  Code stroke.  Left-sided weakness EXAM: CT HEAD WITHOUT CONTRAST TECHNIQUE: Contiguous axial images were obtained from the base of the skull through the vertex without intravenous contrast. RADIATION DOSE REDUCTION: This exam was performed according to the departmental dose-optimization program which includes automated exposure  control, adjustment of the mA and/or kV according to patient size and/or use of iterative reconstruction technique. COMPARISON:  None Available. FINDINGS: Brain: No hemorrhage or convincing acute infarction. Remote left occipital parietal cortex infarct. No hydrocephalus or masslike finding Vascular: Only seen on reformats (due to slice spacing) there is a dense appearance at the distal right MCA Skull: Heterogeneous calvarial density diffusely, likely renal osteodystrophy. Sinuses/Orbits: No acute finding Other: These results were communicated to Dr. Leonel Ramsay at 11:55 am on 01/21/2022 by text page via the [facility] messaging system. ASPECTS [facility] Stroke Program Early CT Score) -when compared to gray-white differentiation on the left - Ganglionic level infarction (caudate, lentiform nuclei, internal capsule, insula, M1-M3 cortex): 7 - Supraganglionic infarction (M4-M6 cortex): 3 Total score (0-10 with 10 being normal): 10 IMPRESSION: 1. Possible dense right MCA. 2. No intracranial hemorrhage.  ASPECTS is 10. 3. Remote left occipital parietal cortex infarction. Electronically Signed   By: Roderic Palau  Watts M.D.   On: 01/21/2022 11:57    Labs:  CBC: Recent Labs    02/02/21 0000 01/21/22 1147 01/21/22 1154 01/23/22 0526  WBC 8.0 8.0  --  8.3  HGB 10.2* 10.9* 11.9* 10.0*  HCT 30* 35.4* 35.0* 32.7*  PLT 227 181  --  176    COAGS: Recent Labs    01/21/22 1147  INR 1.2  APTT 29    BMP: Recent Labs    02/02/21 0000 01/21/22 1147 01/21/22 1154 01/22/22 1018 01/23/22 0526  NA 136* 137 138 135 135  K 4.3 3.7 3.7 4.2 4.6  CL 92* 98 97* 95* 94*  CO2 22 27  --  22 25  GLUCOSE  --  147* 142* 178* 85  BUN 112* 47* 44* 60* 71*  CALCIUM 10.4 9.6  --  9.5 9.8  CREATININE 9.8* 9.16* 9.90* 10.07* 11.33*  GFRNONAA  --  5*  --  4* 4*  GFRAA 4  --   --   --   --     LIVER FUNCTION TESTS: Recent Labs    02/02/21 0000 01/21/22 1147 01/22/22 1018 01/23/22 0526  BILITOT  --  0.6  --   --    AST 16 12*  --   --   ALT 14 9  --   --   ALKPHOS 80 153*  --   --   PROT  --  7.2  --   --   ALBUMIN  --  3.5 3.1* 3.4*    Assessment and Plan: 56 yo female with code stroke, s/p Rt distal M1 mechanical thrombectomy on 01/21/22 by Dr. Gerhard Perches.  Plan -Continue ASA 81mg  QD and Plavix 75mg  QD as directed -No further follow-up needed from [facility] -Further plan per inpatient neurology and stroke team; NIR available for assistance at any time. Please call for any questions or concerns      Electronically Signed: Lura Em, PA 01/23/2022, 9:59 AM   I spent a total of 15 Minutes at the the patient's bedside AND on the patient's hospital floor or unit, greater than 50% of which was counseling/coordinating care for s/p Rt Distal M1 thrombectomy.

## 2022-01-23 NOTE — Progress Notes (Addendum)
STROKE TEAM PROGRESS NOTE   INTERVAL HISTORY She looks good today. Awake and alert sitting up in bed in NAD Will start ASA/Plavix today. HD scheduled for this afternoon  Vitals:   01/23/22 0500 01/23/22 0600 01/23/22 0700 01/23/22 0800  BP: 131/77 138/74 131/84 139/81  Pulse: 70 72 71 73  Resp: (!) 21 16 20 19   Temp:    97.9 F (36.6 C)  TempSrc:    Oral  SpO2: 95% 94% 98% 95%  Weight:       CBC:  Recent Labs  Lab 01/21/22 1147 01/21/22 1154 01/23/22 0526  WBC 8.0  --  8.3  NEUTROABS 4.4  --  4.6  HGB 10.9* 11.9* 10.0*  HCT 35.4* 35.0* 32.7*  MCV 94.4  --  92.6  PLT 181  --  299    Basic Metabolic Panel:  Recent Labs  Lab 01/22/22 1018 01/23/22 0526  NA 135 135  K 4.2 4.6  CL 95* 94*  CO2 22 25  GLUCOSE 178* 85  BUN 60* 71*  CREATININE 10.07* 11.33*  CALCIUM 9.5 9.8  PHOS 6.3* 7.1*    Lipid Panel:  Recent Labs  Lab 01/22/22 0328  CHOL 134  TRIG 93  HDL 36*  CHOLHDL 3.7  VLDL 19  LDLCALC 79    HgbA1c:  Recent Labs  Lab 01/22/22 1018  HGBA1C 6.0*     IMAGING past 24 hours ECHOCARDIOGRAM COMPLETE  Result Date: 01/22/2022    ECHOCARDIOGRAM REPORT   Patient Name:   KATLEN SEYER Date of Exam: 01/22/2022 Medical Rec #:  [id number]         Height:       69.0 in Accession #:    [id number]        Weight:       246.0 lb Date of Birth:  15-May-1966          BSA:          2.256 m Patient Age:    56 years          BP:           122/61 mmHg Patient Gender: F                 HR:           72 bpm. Exam Location:  Inpatient Procedure: 2D Echo, Cardiac Doppler and Color Doppler Indications:    Stroke  History:        Patient has prior history of Echocardiogram examinations, most                 recent 10/31/2021.  Sonographer:    Merrie Roof RDCS Referring Phys: [phone] MCNE[place] P [place]  1. Left ventricular ejection fraction, by estimation, is 55 to 60%. The left ventricle has normal function. The left ventricle has no regional wall motion  abnormalities. The left ventricular internal cavity size was mildly dilated. There is moderate concentric left ventricular hypertrophy. Left ventricular diastolic parameters are consistent with Grade II diastolic dysfunction (pseudonormalization). Elevated left ventricular end-diastolic pressure.  2. Right ventricular systolic function is normal. The right ventricular size is mildly enlarged. Tricuspid regurgitation signal is inadequate for assessing PA pressure.  3. Left atrial size was moderately dilated.  4. The mitral valve is abnormal. Mild to moderate mitral valve regurgitation. No evidence of mitral stenosis. Moderate to severe mitral annular calcification.  5. The aortic valve is tricuspid. There is moderate calcification of the aortic valve. There is mild thickening  of the aortic valve. Aortic valve regurgitation is not visualized. Mild aortic valve stenosis.  6. The inferior vena cava is normal in size with greater than 50% respiratory variability, suggesting right atrial pressure of 3 mmHg. Comparison(s): Prior images unable to be directly viewed, comparison made by report only. [facility] echo 10/31/21: EF 55%, moderate LVH, mild MR, trivial TR, RVSP 54 mmHg. Conclusion(s)/Recommendation(s): No intracardiac source of embolism detected on this transthoracic study. Consider a transesophageal echocardiogram to exclude cardiac source of embolism if clinically indicated. FINDINGS  Left Ventricle: Left ventricular ejection fraction, by estimation, is 55 to 60%. The left ventricle has normal function. The left ventricle has no regional wall motion abnormalities. The left ventricular internal cavity size was mildly dilated. There is  moderate concentric left ventricular hypertrophy. Left ventricular diastolic parameters are consistent with Grade II diastolic dysfunction (pseudonormalization). Elevated left ventricular end-diastolic pressure. Right Ventricle: The right ventricular size is mildly enlarged. Right  vetricular wall thickness was not well visualized. Right ventricular systolic function is normal. Tricuspid regurgitation signal is inadequate for assessing PA pressure. Left Atrium: Left atrial size was moderately dilated. Right Atrium: Right atrial size was normal in size. Pericardium: There is no evidence of pericardial effusion. Mitral Valve: The mitral valve is abnormal. There is moderate thickening of the mitral valve leaflet(s). There is moderate calcification of the mitral valve leaflet(s). Moderate to severe mitral annular calcification. Mild to moderate mitral valve regurgitation. No evidence of mitral valve stenosis. Tricuspid Valve: The tricuspid valve is grossly normal. Tricuspid valve regurgitation is trivial. No evidence of tricuspid stenosis. Aortic Valve: The aortic valve is tricuspid. There is moderate calcification of the aortic valve. There is mild thickening of the aortic valve. Aortic valve regurgitation is not visualized. Mild aortic stenosis is present. Aortic valve mean gradient measures 9.0 mmHg. Aortic valve peak gradient measures 15.7 mmHg. Aortic valve area, by VTI measures 1.40 cm. Pulmonic Valve: The pulmonic valve was not well visualized. Pulmonic valve regurgitation is trivial. No evidence of pulmonic stenosis. Aorta: The aortic root and ascending aorta are structurally normal, with no evidence of dilitation. Venous: The inferior vena cava is normal in size with greater than 50% respiratory variability, suggesting right atrial pressure of 3 mmHg. IAS/Shunts: The interatrial septum was not well visualized.  LEFT VENTRICLE PLAX 2D LVIDd:         5.30 cm   Diastology LVIDs:         3.90 cm   LV e' medial:    5.70 cm/s LV PW:         1.40 cm   LV E/e' medial:  28.2 LV IVS:        1.20 cm   LV e' lateral:   6.30 cm/s LVOT diam:     2.00 cm   LV E/e' lateral: 25.6 LV SV:         59 LV SV Index:   26 LVOT Area:     3.14 cm  RIGHT VENTRICLE             IVC RV Basal diam:  4.50 cm     IVC  diam: 2.00 cm RV S prime:     11.40 cm/s TAPSE (M-mode): 2.4 cm LEFT ATRIUM              Index        RIGHT ATRIUM           Index LA diam:        5.00 cm  2.22 cm/m   RA Area:     18.20 cm LA Vol (A2C):   109.0 ml 48.31 ml/m  RA Volume:   52.10 ml  23.09 ml/m LA Vol (A4C):   68.9 ml  30.54 ml/m LA Biplane Vol: 89.6 ml  39.71 ml/m  AORTIC VALVE AV Area (Vmax):    1.36 cm AV Area (Vmean):   1.29 cm AV Area (VTI):     1.40 cm AV Vmax:           198.00 cm/s AV Vmean:          138.000 cm/s AV VTI:            0.423 m AV Peak Grad:      15.7 mmHg AV Mean Grad:      9.0 mmHg LVOT Vmax:         85.40 cm/s LVOT Vmean:        56.600 cm/s LVOT VTI:          0.188 m LVOT/AV VTI ratio: 0.44  AORTA Ao Root diam: 3.00 cm Ao Asc diam:  3.40 cm MITRAL VALVE MV Area (PHT): 3.02 cm       SHUNTS MV Decel Time: 251 msec       Systemic VTI:  0.19 m MR Peak grad:    91.4 mmHg    Systemic Diam: 2.00 cm MR Mean grad:    60.0 mmHg MR Vmax:         478.00 cm/s MR Vmean:        364.0 cm/s MR PISA:         2.26 cm MR PISA Eff ROA: 16 mm MR PISA Radius:  0.60 cm MV E velocity: 161.00 cm/s MV A velocity: 83.80 cm/s MV E/A ratio:  1.92 Buford Dresser MD Electronically signed by Buford Dresser MD Signature Date/Time: 01/22/2022/3:55:40 PM    Final    MR BRAIN WO CONTRAST  Result Date: 01/22/2022 CLINICAL DATA:  Stroke follow-up. Acute onset left-sided weakness and sensory deficit at dialysis appointment EXAM: MRI HEAD WITHOUT CONTRAST MRA HEAD WITHOUT CONTRAST TECHNIQUE: Multiplanar, multi-echo pulse sequences of the brain and surrounding structures were acquired without intravenous contrast. Angiographic images of the Circle of Willis were acquired using MRA technique without intravenous contrast. COMPARISON:  Head CT and CTA from yesterday. FINDINGS: MRI HEAD FINDINGS Brain: Scattered areas of acute cortical infarction in the right insula, posterior temporal, and frontal cortex, overall small volume. Mild white matter  infarction in the right occipital parietal region. No acute hemorrhage, hydrocephalus, or collection. Left parietal white matter FLAIR hyperintensity likely from old ischemic insult. Vascular: Arterial findings below. Skull and upper cervical spine: Normal marrow signal Sinuses/Orbits: Negative MRA HEAD FINDINGS Anterior circulation: Atheromatous irregularity of the carotid siphons. Re-established flow in the right MCA at site of previously seen embolism. No downstream or contralateral embolic disease noted. Negative for aneurysm. Posterior circulation: Codominant vertebral arteries. The vertebral and basilar arteries are smoothly contoured and widely patent. No branch occlusion, beading, or aneurysm IMPRESSION: Brain MRI: Small areas of acute infarction in the right MCA distribution, primarily cortical and limited in extent when compared to the level of prior occlusion. MRA: 1. Normalized appearance of the right MCA bifurcation after embolectomy. No evidence of interval embolic disease. 2. Atheromatous irregularity of the carotid siphons. Electronically Signed   By: Jorje Guild M.D.   On: 01/22/2022 12:26   MR ANGIO HEAD WO CONTRAST  Result Date: 01/22/2022 CLINICAL DATA:  Stroke follow-up. Acute onset  left-sided weakness and sensory deficit at dialysis appointment EXAM: MRI HEAD WITHOUT CONTRAST MRA HEAD WITHOUT CONTRAST TECHNIQUE: Multiplanar, multi-echo pulse sequences of the brain and surrounding structures were acquired without intravenous contrast. Angiographic images of the Circle of Willis were acquired using MRA technique without intravenous contrast. COMPARISON:  Head CT and CTA from yesterday. FINDINGS: MRI HEAD FINDINGS Brain: Scattered areas of acute cortical infarction in the right insula, posterior temporal, and frontal cortex, overall small volume. Mild white matter infarction in the right occipital parietal region. No acute hemorrhage, hydrocephalus, or collection. Left parietal white  matter FLAIR hyperintensity likely from old ischemic insult. Vascular: Arterial findings below. Skull and upper cervical spine: Normal marrow signal Sinuses/Orbits: Negative MRA HEAD FINDINGS Anterior circulation: Atheromatous irregularity of the carotid siphons. Re-established flow in the right MCA at site of previously seen embolism. No downstream or contralateral embolic disease noted. Negative for aneurysm. Posterior circulation: Codominant vertebral arteries. The vertebral and basilar arteries are smoothly contoured and widely patent. No branch occlusion, beading, or aneurysm IMPRESSION: Brain MRI: Small areas of acute infarction in the right MCA distribution, primarily cortical and limited in extent when compared to the level of prior occlusion. MRA: 1. Normalized appearance of the right MCA bifurcation after embolectomy. No evidence of interval embolic disease. 2. Atheromatous irregularity of the carotid siphons. Electronically Signed   By: Jorje Guild M.D.   On: 01/22/2022 12:26    PHYSICAL EXAM General:  Alert, obese middle-aged African-American lady with bilateral BKAs in no acute distress Respiratory:  Regular, unlabored respirations on room air  NEURO:  Mental Status: AA&Ox3  Speech/Language: speech is without dysarthria or aphasia.  Fluency, and comprehension intact.  Cranial Nerves:  II: PERRL. Visual fields full.  III, IV, VI: EOMI. Eyelids elevate symmetrically.  V: Sensation is intact to light touch and symmetrical to face.  VII: Smile is symmetrical. Able to puff cheeks and raise eyebrows.  VIII: hearing intact to voice. IX, X: Phonation is normal.  XII: tongue is midline without fasciculations. Motor: 5/5 strength to all muscle groups tested.  Diminished fine finger movements on the left.  Orbits right over left upper extremity. Tone: is normal and bulk is normal Sensation- Intact to light touch bilaterally.  Coordination: FTN intact bilaterally, HKS: no ataxia in BLE.No  drift. Fine motor movements slowed on right with left hand orbiting right Gait- deferred  NIH stroke scale 0  ASSESSMENT/PLAN Ms. Cindy Osborne is a 56 y.o. female with history of T2DM, bilateral BKA and ESRD presenting with acute onset left sided weakness and sensory deficit.  TNK was given in the ED, and patient underwent thrombectomy.  This was successful with TICI 3 flow achieved.  Patient has been extubated and is now doing well with very few deficits seen on neuro exam.  Stroke:  right MCA infarct likely secondary due to right M1 occlusion s/p TNK and mechanical thrombectomy with TICI 3 revascularization Code Stroke CT head dense right MCA CTA head & neck LVO at right M1 segment MRI  Small areas of acute infarction in the right MCA distribution, primarily cortical and limited in extent when compared to the level of prior occlusion MRA head 1. Normalized appearance of the right MCA bifurcation after embolectomy. No evidence of interval embolic disease. 2. Atheromatous irregularity of the carotid siphons 2D Echo EF 55-60% LDL 79 HgbA1c 6.0 VTE prophylaxis - unable to wear SCDs due to BKA, hold lovenox for 24 hours post TNK    Diet   Diet Carb  Modified Fluid consistency: Thin; Room service appropriate? Yes; Fluid restriction: 1200 mL Fluid   aspirin 81 mg daily prior to admission, now on aspirin 81 mg daily and clopidogrel 75 mg daily  Therapy recommendations:  no PT/OT follow up Disposition:  pending, likely home  Hypertension Home meds:  amlodipine 10 mg daily, metoprolol XR 200 mg daily Stable Permissive hypertension (OK if < 220/120) but gradually normalize in 5-7 days Long-term BP goal normotensive  Hyperlipidemia Home meds:  pravastatin 20 mg daily, increase to 40 mg daily LDL 79, goal < 70 High intensity statin not indicated due to previous intolerance Continue statin at discharge  Diabetes type II Controlled Home meds:  insulin glargine 18 units daily HgbA1c  6.0, goal < 7.0 CBGs Recent Labs    01/22/22 2321 01/23/22 0311 01/23/22 0745  GLUCAP 201* 121* 66    SSI  ESRD Patient is on IHD outpatient Plan for dialysis today  Other Stroke Risk Factors Obesity, Body mass index is 36.33 kg/m., BMI >/= 30 associated with increased stroke risk, recommend weight loss, diet and exercise as appropriate   Other Active Problems Anemia due to CKD    Hospital day # 2  August Albino  DNP, [place] See Amion for schedule and pager information 01/23/2022 10:05 AM  ATTENDING ATTESTATION:  56 year old right M1 occlusion status post TNK and IR.  She is now back to her baseline which is quite remarkable.  MRI shows right MCA CVA.  MRI is negative.  Echo is negative.  We will add DAPT therapy today.  She is to go for dialysis today and can get transfer out of the ICU today.  Dr. Reeves Forth evaluated pt independently, reviewed imaging, chart, labs. Discussed and formulated plan with the APP. Please see APP note above for details.   I spent 35 minutes of care time in the care of this patient.   Ranen Doolin,MD     To contact Stroke Continuity provider, please refer to [email or web address] After hours, contact General Neurology

## 2022-01-23 NOTE — Progress Notes (Signed)
Requested to see pt this afternoon regarding pt receiving out-pt HD tomorrow at pt's clinic to avoid HD on day of d/c. Navigator made multiple attempts to reach staff at [facility] to make clinic aware pt will likely require out-pt HD tomorrow but was unable to reach any staff. Contacted pt via phone. Pt is scheduled for inpt HD today and was expecting to d/c to home after HD. Pt's inpt HD treatment has been pushed back to 2nd shift. Pt is hoping to d/c home this evening after HD and resume out-pt HD at clinic tomorrow. Navigator did discuss with pt that in order to avoid HD on day of d/c, pt may be d/c tomorrow am in order to make it to out-pt clinic appt. Pt voices concerns about obtaining transportation at d/c from hospital in order to get to pt's home in order for pt to drive self to clinic. May need to discuss this further with [facility] staff in the am if pt unable to d/c this evening. Have requested that pt not receive HD on first shift tomorrow so plans can be made for out-pt HD treatment at clinic. Will contact clinic in the am if pt does not d/c this evening. Will also f/u with pt and providers in the am if needed.  Melven Sartorius Renal Navigator [phone]

## 2022-01-23 NOTE — Progress Notes (Signed)
[facility] Progress Note   Dialysis Orders: [facility] - 4hrs; BFR 400; DFR 600; 2K/2.5Ca; EDW 105kg -Heparin 2000 unit bolus with HD -Sensipar 30mg  with HD -Calcitriol 0.68mcg with HD-last dose 01/19/22 -Venofer 100mg  IV X 5 doses (new order, suppose to start 6/10) -Micera 216mcg Q2wks-last dose 01/14/22  Assessment/Plan: Arterial Ischemic CVA/MCA-s/p mechanical thrombectomy 6/10 in IR; on 2nd CVA prevention: ASA, Brillinta, statin; ECHO pending; MRI and swallow eval ordered; followed by critical care team ESRD - on HD TTS; no urgent need for HD today. She's on for HD today 2nd shift; if she's still here tomorrow will HD Tuesday o/w resume outpt regimen.  Hypertension/volume-patient remains euvolemic on exam and Bps stable. Noted patient ordered 1x dose IV lasix. Monitor weight trends closely. Anemia of CKD - Hgb 11.9; ESA last given 01/14/22. No indication to resume ESA or Fe at this time. Checking labs in AM. 5.   Secondary Hyperparathyroidism - Corr Ca 10.2 and PO4 elevated. Will resume Sensipar and hold Calcitriol for now. 6.   Nutrition - Advance to renal diet once clinically stable  Subjective:    Seen and examined patient at bedside. BP stable. Denies SOB, CP, and N/V. Plan for HD today 2nd shift.  Objective Vitals:   01/23/22 0500 01/23/22 0600 01/23/22 0700 01/23/22 0800  BP: 131/77 138/74 131/84 139/81  Pulse: 70 72 71 73  Resp: (!) 21 16 20 19   Temp:    97.9 F (36.6 C)  TempSrc:    Oral  SpO2: 95% 94% 98% 95%  Weight:       Physical Exam General: Well-appearing; on RA; NAD Heart: Normal S1 and S2; No murmurs, gallops, or rubs Lungs: Clear throughout; No wheezing, rales, or rhonchi Abdomen: Soft and non-tender Extremities: Bilateral BKAs-no edema at stumps Dialysis Access: AVF (+) B/T, transposed lt BCF  Filed Weights   01/21/22 1100  Weight: 111.6 kg    Intake/Output Summary (Last 24 hours)  at 01/23/2022 1147 Last data filed at 01/23/2022 0800 Gross per 24 hour  Intake 497.38 ml  Output --  Net 497.38 ml    Additional Objective Labs: Basic Metabolic Panel: Recent Labs  Lab 01/21/22 1147 01/21/22 1154 01/22/22 1018 01/23/22 0526  NA 137 138 135 135  K 3.7 3.7 4.2 4.6  CL 98 97* 95* 94*  CO2 27  --  22 25  GLUCOSE 147* 142* 178* 85  BUN 47* 44* 60* 71*  CREATININE 9.16* 9.90* 10.07* 11.33*  CALCIUM 9.6  --  9.5 9.8  PHOS  --   --  6.3* 7.1*   Liver Function Tests: Recent Labs  Lab 01/21/22 1147 01/22/22 1018 01/23/22 0526  AST 12*  --   --   ALT 9  --   --   ALKPHOS 153*  --   --   BILITOT 0.6  --   --   PROT 7.2  --   --   ALBUMIN 3.5 3.1* 3.4*   No results for input(s): "LIPASE", "AMYLASE" in the last 168 hours. CBC: Recent Labs  Lab 01/21/22 1147 01/21/22 1154 01/23/22 0526  WBC 8.0  --  8.3  NEUTROABS 4.4  --  4.6  HGB 10.9* 11.9* 10.0*  HCT 35.4* 35.0* 32.7*  MCV 94.4  --  92.6  PLT 181  --  176   Blood Culture No results found for: "SDES", "SPECREQUEST", "CULT", "REPTSTATUS"  Cardiac Enzymes: No results for input(s): "CKTOTAL", "CKMB", "CKMBINDEX", "TROPONINI" in  the last 168 hours. CBG: Recent Labs  Lab 01/22/22 1921 01/22/22 2321 01/23/22 0311 01/23/22 0745 01/23/22 1134  GLUCAP 158* 201* 121* 77 87   Iron Studies: No results for input(s): "IRON", "TIBC", "TRANSFERRIN", "FERRITIN" in the last 72 hours. Lab Results  Component Value Date   INR 1.2 01/21/2022   Studies/Results: ECHOCARDIOGRAM COMPLETE  Result Date: 01/22/2022    ECHOCARDIOGRAM REPORT   Patient Name:   Cindy Osborne Date of Exam: 01/22/2022 Medical Rec #:  [id number]         Height:       69.0 in Accession #:    [id number]        Weight:       246.0 lb Date of Birth:  February 12, 1966          BSA:          2.256 m Patient Age:    56 years          BP:           122/61 mmHg Patient Gender: F                 HR:           72 bpm. Exam Location:  Inpatient Procedure:  2D Echo, Cardiac Doppler and Color Doppler Indications:    Stroke  History:        Patient has prior history of Echocardiogram examinations, most                 recent 10/31/2021.  Sonographer:    Merrie Roof RDCS Referring Phys: [phone] MCNEILL P [place]  1. Left ventricular ejection fraction, by estimation, is 55 to 60%. The left ventricle has normal function. The left ventricle has no regional wall motion abnormalities. The left ventricular internal cavity size was mildly dilated. There is moderate concentric left ventricular hypertrophy. Left ventricular diastolic parameters are consistent with Grade II diastolic dysfunction (pseudonormalization). Elevated left ventricular end-diastolic pressure.  2. Right ventricular systolic function is normal. The right ventricular size is mildly enlarged. Tricuspid regurgitation signal is inadequate for assessing PA pressure.  3. Left atrial size was moderately dilated.  4. The mitral valve is abnormal. Mild to moderate mitral valve regurgitation. No evidence of mitral stenosis. Moderate to severe mitral annular calcification.  5. The aortic valve is tricuspid. There is moderate calcification of the aortic valve. There is mild thickening of the aortic valve. Aortic valve regurgitation is not visualized. Mild aortic valve stenosis.  6. The inferior vena cava is normal in size with greater than 50% respiratory variability, suggesting right atrial pressure of 3 mmHg. Comparison(s): Prior images unable to be directly viewed, comparison made by report only. [facility] echo 10/31/21: EF 55%, moderate LVH, mild MR, trivial TR, RVSP 54 mmHg. Conclusion(s)/Recommendation(s): No intracardiac source of embolism detected on this transthoracic study. Consider a transesophageal echocardiogram to exclude cardiac source of embolism if clinically indicated. FINDINGS  Left Ventricle: Left ventricular ejection fraction, by estimation, is 55 to 60%. The left ventricle has normal function.  The left ventricle has no regional wall motion abnormalities. The left ventricular internal cavity size was mildly dilated. There is  moderate concentric left ventricular hypertrophy. Left ventricular diastolic parameters are consistent with Grade II diastolic dysfunction (pseudonormalization). Elevated left ventricular end-diastolic pressure. Right Ventricle: The right ventricular size is mildly enlarged. Right vetricular wall thickness was not well visualized. Right ventricular systolic function is normal. Tricuspid regurgitation signal is inadequate for assessing PA pressure. Left Atrium:  Left atrial size was moderately dilated. Right Atrium: Right atrial size was normal in size. Pericardium: There is no evidence of pericardial effusion. Mitral Valve: The mitral valve is abnormal. There is moderate thickening of the mitral valve leaflet(s). There is moderate calcification of the mitral valve leaflet(s). Moderate to severe mitral annular calcification. Mild to moderate mitral valve regurgitation. No evidence of mitral valve stenosis. Tricuspid Valve: The tricuspid valve is grossly normal. Tricuspid valve regurgitation is trivial. No evidence of tricuspid stenosis. Aortic Valve: The aortic valve is tricuspid. There is moderate calcification of the aortic valve. There is mild thickening of the aortic valve. Aortic valve regurgitation is not visualized. Mild aortic stenosis is present. Aortic valve mean gradient measures 9.0 mmHg. Aortic valve peak gradient measures 15.7 mmHg. Aortic valve area, by VTI measures 1.40 cm. Pulmonic Valve: The pulmonic valve was not well visualized. Pulmonic valve regurgitation is trivial. No evidence of pulmonic stenosis. Aorta: The aortic root and ascending aorta are structurally normal, with no evidence of dilitation. Venous: The inferior vena cava is normal in size with greater than 50% respiratory variability, suggesting right atrial pressure of 3 mmHg. IAS/Shunts: The interatrial  septum was not well visualized.  LEFT VENTRICLE PLAX 2D LVIDd:         5.30 cm   Diastology LVIDs:         3.90 cm   LV e' medial:    5.70 cm/s LV PW:         1.40 cm   LV E/e' medial:  28.2 LV IVS:        1.20 cm   LV e' lateral:   6.30 cm/s LVOT diam:     2.00 cm   LV E/e' lateral: 25.6 LV SV:         59 LV SV Index:   26 LVOT Area:     3.14 cm  RIGHT VENTRICLE             IVC RV Basal diam:  4.50 cm     IVC diam: 2.00 cm RV S prime:     11.40 cm/s TAPSE (M-mode): 2.4 cm LEFT ATRIUM              Index        RIGHT ATRIUM           Index LA diam:        5.00 cm  2.22 cm/m   RA Area:     18.20 cm LA Vol (A2C):   109.0 ml 48.31 ml/m  RA Volume:   52.10 ml  23.09 ml/m LA Vol (A4C):   68.9 ml  30.54 ml/m LA Biplane Vol: 89.6 ml  39.71 ml/m  AORTIC VALVE AV Area (Vmax):    1.36 cm AV Area (Vmean):   1.29 cm AV Area (VTI):     1.40 cm AV Vmax:           198.00 cm/s AV Vmean:          138.000 cm/s AV VTI:            0.423 m AV Peak Grad:      15.7 mmHg AV Mean Grad:      9.0 mmHg LVOT Vmax:         85.40 cm/s LVOT Vmean:        56.600 cm/s LVOT VTI:          0.188 m LVOT/AV VTI ratio: 0.44  AORTA Ao Root diam: 3.00 cm Ao  Asc diam:  3.40 cm MITRAL VALVE MV Area (PHT): 3.02 cm       SHUNTS MV Decel Time: 251 msec       Systemic VTI:  0.19 m MR Peak grad:    91.4 mmHg    Systemic Diam: 2.00 cm MR Mean grad:    60.0 mmHg MR Vmax:         478.00 cm/s MR Vmean:        364.0 cm/s MR PISA:         2.26 cm MR PISA Eff ROA: 16 mm MR PISA Radius:  0.60 cm MV E velocity: 161.00 cm/s MV A velocity: 83.80 cm/s MV E/A ratio:  1.92 Buford Dresser MD Electronically signed by Buford Dresser MD Signature Date/Time: 01/22/2022/3:55:40 PM    Final    MR BRAIN WO CONTRAST  Result Date: 01/22/2022 CLINICAL DATA:  Stroke follow-up. Acute onset left-sided weakness and sensory deficit at dialysis appointment EXAM: MRI HEAD WITHOUT CONTRAST MRA HEAD WITHOUT CONTRAST TECHNIQUE: Multiplanar, multi-echo pulse sequences of  the brain and surrounding structures were acquired without intravenous contrast. Angiographic images of the Circle of Willis were acquired using MRA technique without intravenous contrast. COMPARISON:  Head CT and CTA from yesterday. FINDINGS: MRI HEAD FINDINGS Brain: Scattered areas of acute cortical infarction in the right insula, posterior temporal, and frontal cortex, overall small volume. Mild white matter infarction in the right occipital parietal region. No acute hemorrhage, hydrocephalus, or collection. Left parietal white matter FLAIR hyperintensity likely from old ischemic insult. Vascular: Arterial findings below. Skull and upper cervical spine: Normal marrow signal Sinuses/Orbits: Negative MRA HEAD FINDINGS Anterior circulation: Atheromatous irregularity of the carotid siphons. Re-established flow in the right MCA at site of previously seen embolism. No downstream or contralateral embolic disease noted. Negative for aneurysm. Posterior circulation: Codominant vertebral arteries. The vertebral and basilar arteries are smoothly contoured and widely patent. No branch occlusion, beading, or aneurysm IMPRESSION: Brain MRI: Small areas of acute infarction in the right MCA distribution, primarily cortical and limited in extent when compared to the level of prior occlusion. MRA: 1. Normalized appearance of the right MCA bifurcation after embolectomy. No evidence of interval embolic disease. 2. Atheromatous irregularity of the carotid siphons. Electronically Signed   By: Jorje Guild M.D.   On: 01/22/2022 12:26   MR ANGIO HEAD WO CONTRAST  Result Date: 01/22/2022 CLINICAL DATA:  Stroke follow-up. Acute onset left-sided weakness and sensory deficit at dialysis appointment EXAM: MRI HEAD WITHOUT CONTRAST MRA HEAD WITHOUT CONTRAST TECHNIQUE: Multiplanar, multi-echo pulse sequences of the brain and surrounding structures were acquired without intravenous contrast. Angiographic images of the Circle of Willis  were acquired using MRA technique without intravenous contrast. COMPARISON:  Head CT and CTA from yesterday. FINDINGS: MRI HEAD FINDINGS Brain: Scattered areas of acute cortical infarction in the right insula, posterior temporal, and frontal cortex, overall small volume. Mild white matter infarction in the right occipital parietal region. No acute hemorrhage, hydrocephalus, or collection. Left parietal white matter FLAIR hyperintensity likely from old ischemic insult. Vascular: Arterial findings below. Skull and upper cervical spine: Normal marrow signal Sinuses/Orbits: Negative MRA HEAD FINDINGS Anterior circulation: Atheromatous irregularity of the carotid siphons. Re-established flow in the right MCA at site of previously seen embolism. No downstream or contralateral embolic disease noted. Negative for aneurysm. Posterior circulation: Codominant vertebral arteries. The vertebral and basilar arteries are smoothly contoured and widely patent. No branch occlusion, beading, or aneurysm IMPRESSION: Brain MRI: Small areas of acute infarction in the  right MCA distribution, primarily cortical and limited in extent when compared to the level of prior occlusion. MRA: 1. Normalized appearance of the right MCA bifurcation after embolectomy. No evidence of interval embolic disease. 2. Atheromatous irregularity of the carotid siphons. Electronically Signed   By: Jorje Guild M.D.   On: 01/22/2022 12:26   IR PERCUTANEOUS ART THROMBECTOMY/INFUSION INTRACRANIAL INC DIAG ANGIO  Result Date: 01/21/2022 INDICATION: Patient with MCA syndrome, distal M1 occlusion, left hemiplegia EXAM: 1. EMERGENT LARGE VESSEL OCCLUSION THROMBOLYSIS (ANTERIOR CIRCULATION) 2. MECHANICAL THROMBECTOMY OF THE DISTAL M1 OCCLUSION BY THE ADAPT (A DIRECT ASPIRATION FIRST PASS TECHNIQUE) TECHNIQUE COMPARISON:  Correlation is made with a CTA of the head and neck performed on the same day earlier MEDICATIONS: Injection Ancef 2 g was administered within 1  hour of the procedure ANESTHESIA/SEDATION: General anesthesia CONTRAST:  60 mL of Omnipaque 300 FLUOROSCOPY TIME:  Fluoroscopy Time: 10 minutes 42 seconds (581 mGy). COMPLICATIONS: None immediate. TECHNIQUE: As the patient's family was not available and patient was not in a position to understand and consent for the procedure, procedure was performed under an emergent consent. The patient was then put under general anesthesia by the Department of Anesthesiology at [facility]. The right groin was prepped and draped in the usual sterile fashion. Thereafter using modified Seldinger technique, transfemoral access into the right common femoral artery was obtained without difficulty. Over a 0.035 inch guidewire a 8 French x 25 cm pinnacle was inserted. Through this, and also over a 0.035 inch glidewire a a walrus balloon guide catheter with a 6 French neuron select catheter was advanced to the aortic arch region and selective catheterization of the right common carotid artery followed by angiogram was performed. Then, under roadmap guidance, the guide catheter was advanced into the distal intracranial internal carotid artery. Next, zoom 55 and phenom 21 microcatheter and synchro 14 microwire were utilized and under roadmap guidance, microcatheter and zoom 55 were advanced into the distal intracranial ICA. Biplane DSA cerebral angiography of the right ICA injection was performed over the head. Then, under roadmap guidance, microwire and microcatheter were navigated into the M1 segment of the right MCA. The microwire was advanced into the M2-M3 segment past the occlusion at the distal M1 segment. Next, zoom 55 was advanced with its distal end at the site of the occlusion of the distal M1. The microcatheter and wire were removed and the zoom 55 was connected to the penumbra aspiration suction system. After 3 minutes of suction, the zoom 55 catheter was removed the walrus guide catheter was disconnected at the hub,  10 mL of blood was aspirated and then DSA cerebral angiogram was performed. Next, repeat DSA cerebral angiogram was performed after about 10 minutes to confirm the continued patency of the right MCA and its branches. Subsequently, dynamic CT head was performed. Next, the femoral sheath was partially withdrawn with its distal end in the external iliac artery and right femoral angiogram was performed and the femoral access site was closed by using an 8 [place] Angio-Seal device. Patient was then extubated and was transferred to the ICU in stable condition. FINDINGS: Right common carotid angiogram demonstrated common, internal and external carotid arteries to be widely patent without significant stenosis. Mild atheromatous disease. Biplane DSA cerebral angiography demonstrated distal intracranial internal carotid artery to be widely patent. Anterior cerebral artery and its branches have a normal appearance. There is occlusion at the distal M1 immediately distal to the origin of the anterior temporal artery. Post aspiration  thrombectomy cerebral angiogram demonstrated complete revascularization with TICI 3 results. The right middle cerebral artery and its branches have a normal appearance. The venous phase of the angiogram is unremarkable with patent superior sagittal, inferior sagittal, transverse, sigmoid sinuses. No aneurysm, av malformation or dural fistula seen. Dyna CT of the head demonstrated no intracranial bleed. IMPRESSION: Successful mechanical thrombectomy for distal right M1 occlusion of the MCA by direct aspiration first pass technique with TICI 3 results. PLAN: Patient will be admitted in the ICU for close monitoring. Systolic blood pressure goal to be at 120-140 mm Hg. Electronically Signed   By: Frazier Richards M.D.   On: 01/21/2022 15:23   IR CT Head Ltd  Result Date: 01/21/2022 INDICATION: Patient with MCA syndrome, distal M1 occlusion, left hemiplegia EXAM: 1. EMERGENT LARGE VESSEL OCCLUSION  THROMBOLYSIS (ANTERIOR CIRCULATION) 2. MECHANICAL THROMBECTOMY OF THE DISTAL M1 OCCLUSION BY THE ADAPT (A DIRECT ASPIRATION FIRST PASS TECHNIQUE) TECHNIQUE COMPARISON:  Correlation is made with a CTA of the head and neck performed on the same day earlier MEDICATIONS: Injection Ancef 2 g was administered within 1 hour of the procedure ANESTHESIA/SEDATION: General anesthesia CONTRAST:  60 mL of Omnipaque 300 FLUOROSCOPY TIME:  Fluoroscopy Time: 10 minutes 42 seconds (581 mGy). COMPLICATIONS: None immediate. TECHNIQUE: As the patient's family was not available and patient was not in a position to understand and consent for the procedure, procedure was performed under an emergent consent. The patient was then put under general anesthesia by the Department of Anesthesiology at [facility]. The right groin was prepped and draped in the usual sterile fashion. Thereafter using modified Seldinger technique, transfemoral access into the right common femoral artery was obtained without difficulty. Over a 0.035 inch guidewire a 8 French x 25 cm pinnacle was inserted. Through this, and also over a 0.035 inch glidewire a a walrus balloon guide catheter with a 6 French neuron select catheter was advanced to the aortic arch region and selective catheterization of the right common carotid artery followed by angiogram was performed. Then, under roadmap guidance, the guide catheter was advanced into the distal intracranial internal carotid artery. Next, zoom 55 and phenom 21 microcatheter and synchro 14 microwire were utilized and under roadmap guidance, microcatheter and zoom 55 were advanced into the distal intracranial ICA. Biplane DSA cerebral angiography of the right ICA injection was performed over the head. Then, under roadmap guidance, microwire and microcatheter were navigated into the M1 segment of the right MCA. The microwire was advanced into the M2-M3 segment past the occlusion at the distal M1 segment. Next, zoom  55 was advanced with its distal end at the site of the occlusion of the distal M1. The microcatheter and wire were removed and the zoom 55 was connected to the penumbra aspiration suction system. After 3 minutes of suction, the zoom 55 catheter was removed the walrus guide catheter was disconnected at the hub, 10 mL of blood was aspirated and then DSA cerebral angiogram was performed. Next, repeat DSA cerebral angiogram was performed after about 10 minutes to confirm the continued patency of the right MCA and its branches. Subsequently, dynamic CT head was performed. Next, the femoral sheath was partially withdrawn with its distal end in the external iliac artery and right femoral angiogram was performed and the femoral access site was closed by using an 8 [place] Angio-Seal device. Patient was then extubated and was transferred to the ICU in stable condition. FINDINGS: Right common carotid angiogram demonstrated common, internal and external carotid  arteries to be widely patent without significant stenosis. Mild atheromatous disease. Biplane DSA cerebral angiography demonstrated distal intracranial internal carotid artery to be widely patent. Anterior cerebral artery and its branches have a normal appearance. There is occlusion at the distal M1 immediately distal to the origin of the anterior temporal artery. Post aspiration thrombectomy cerebral angiogram demonstrated complete revascularization with TICI 3 results. The right middle cerebral artery and its branches have a normal appearance. The venous phase of the angiogram is unremarkable with patent superior sagittal, inferior sagittal, transverse, sigmoid sinuses. No aneurysm, av malformation or dural fistula seen. Dyna CT of the head demonstrated no intracranial bleed. IMPRESSION: Successful mechanical thrombectomy for distal right M1 occlusion of the MCA by direct aspiration first pass technique with TICI 3 results. PLAN: Patient will be admitted in the ICU for  close monitoring. Systolic blood pressure goal to be at 120-140 mm Hg. Electronically Signed   By: Frazier Richards M.D.   On: 01/21/2022 15:23   IR US Guide Vasc Access Right  Result Date: 01/21/2022 INDICATION: Patient with MCA syndrome, distal M1 occlusion, left hemiplegia EXAM: 1. EMERGENT LARGE VESSEL OCCLUSION THROMBOLYSIS (ANTERIOR CIRCULATION) 2. MECHANICAL THROMBECTOMY OF THE DISTAL M1 OCCLUSION BY THE ADAPT (A DIRECT ASPIRATION FIRST PASS TECHNIQUE) TECHNIQUE COMPARISON:  Correlation is made with a CTA of the head and neck performed on the same day earlier MEDICATIONS: Injection Ancef 2 g was administered within 1 hour of the procedure ANESTHESIA/SEDATION: General anesthesia CONTRAST:  60 mL of Omnipaque 300 FLUOROSCOPY TIME:  Fluoroscopy Time: 10 minutes 42 seconds (581 mGy). COMPLICATIONS: None immediate. TECHNIQUE: As the patient's family was not available and patient was not in a position to understand and consent for the procedure, procedure was performed under an emergent consent. The patient was then put under general anesthesia by the Department of Anesthesiology at [facility]. The right groin was prepped and draped in the usual sterile fashion. Thereafter using modified Seldinger technique, transfemoral access into the right common femoral artery was obtained without difficulty. Over a 0.035 inch guidewire a 8 French x 25 cm pinnacle was inserted. Through this, and also over a 0.035 inch glidewire a a walrus balloon guide catheter with a 6 French neuron select catheter was advanced to the aortic arch region and selective catheterization of the right common carotid artery followed by angiogram was performed. Then, under roadmap guidance, the guide catheter was advanced into the distal intracranial internal carotid artery. Next, zoom 55 and phenom 21 microcatheter and synchro 14 microwire were utilized and under roadmap guidance, microcatheter and zoom 55 were advanced into the distal  intracranial ICA. Biplane DSA cerebral angiography of the right ICA injection was performed over the head. Then, under roadmap guidance, microwire and microcatheter were navigated into the M1 segment of the right MCA. The microwire was advanced into the M2-M3 segment past the occlusion at the distal M1 segment. Next, zoom 55 was advanced with its distal end at the site of the occlusion of the distal M1. The microcatheter and wire were removed and the zoom 55 was connected to the penumbra aspiration suction system. After 3 minutes of suction, the zoom 55 catheter was removed the walrus guide catheter was disconnected at the hub, 10 mL of blood was aspirated and then DSA cerebral angiogram was performed. Next, repeat DSA cerebral angiogram was performed after about 10 minutes to confirm the continued patency of the right MCA and its branches. Subsequently, dynamic CT head was performed. Next, the femoral sheath  was partially withdrawn with its distal end in the external iliac artery and right femoral angiogram was performed and the femoral access site was closed by using an 8 [place] Angio-Seal device. Patient was then extubated and was transferred to the ICU in stable condition. FINDINGS: Right common carotid angiogram demonstrated common, internal and external carotid arteries to be widely patent without significant stenosis. Mild atheromatous disease. Biplane DSA cerebral angiography demonstrated distal intracranial internal carotid artery to be widely patent. Anterior cerebral artery and its branches have a normal appearance. There is occlusion at the distal M1 immediately distal to the origin of the anterior temporal artery. Post aspiration thrombectomy cerebral angiogram demonstrated complete revascularization with TICI 3 results. The right middle cerebral artery and its branches have a normal appearance. The venous phase of the angiogram is unremarkable with patent superior sagittal, inferior sagittal,  transverse, sigmoid sinuses. No aneurysm, av malformation or dural fistula seen. Dyna CT of the head demonstrated no intracranial bleed. IMPRESSION: Successful mechanical thrombectomy for distal right M1 occlusion of the MCA by direct aspiration first pass technique with TICI 3 results. PLAN: Patient will be admitted in the ICU for close monitoring. Systolic blood pressure goal to be at 120-140 mm Hg. Electronically Signed   By: Frazier Richards M.D.   On: 01/21/2022 15:23   CT ANGIO HEAD NECK W WO CM (CODE STROKE)  Result Date: 01/21/2022 CLINICAL DATA:  Acute stroke suspected EXAM: CT ANGIOGRAPHY HEAD AND NECK TECHNIQUE: Multidetector CT imaging of the head and neck was performed using the standard protocol during bolus administration of intravenous contrast. Multiplanar CT image reconstructions and MIPs were obtained to evaluate the vascular anatomy. Carotid stenosis measurements (when applicable) are obtained utilizing NASCET criteria, using the distal internal carotid diameter as the denominator. RADIATION DOSE REDUCTION: This exam was performed according to the departmental dose-optimization program which includes automated exposure control, adjustment of the mA and/or kV according to patient size and/or use of iterative reconstruction technique. CONTRAST:  23mL OMNIPAQUE IOHEXOL 350 MG/ML SOLN COMPARISON:  None Available. FINDINGS: CTA NECK FINDINGS Aortic arch: Atheromatous plaque.  Three vessel branching. Right carotid system: Diffuse atheromatous plaque, primarily calcified. No flow limiting stenosis or ulceration of the common and internal carotid arteries. ECA origin stenosis. Left carotid system: Calcified atheromatous plaque without stenosis or ulceration. Vertebral arteries: No proximal subclavian stenosis. Calcified plaque at the right vertebral origin. No flow limiting stenosis, beading, or dissection. Skeleton: Renal osteodystrophy findings at the medial clavicular heads. Other neck: 13 mm left  thyroid nodule. No followup recommended (ref: J Am Coll Radiol. 2015 Feb;12(2): 143-50). Upper chest: At least moderate volume layering right pleural effusion. Review of the MIP images confirms the above findings CTA HEAD FINDINGS Anterior circulation: Abrupt cut off of the right MCA just beyond the anterior temporal branch. Partial downstream reconstitution. No left-sided or anterior cerebral embolic disease is seen. Confluent calcified plaque on the carotid siphons Posterior circulation: The vertebral and basilar arteries are smoothly contoured and diffusely patent. No branch occlusion, beading, or aneurysm. Venous sinuses: Unremarkable for the arterial phase Anatomic variants: None significant Review of the MIP images confirms the above findings Critical Value/emergent results were called by telephone at the time of interpretation on 01/21/2022 at 12:05 pm to provider [facility] , who verbally acknowledged these results. IMPRESSION: 1. Emergent large vessel occlusion at the right M1 segment, just beyond the anterior temporal branch. 2. Atherosclerosis without flow limiting stenosis or embolic source seen in the more proximal neck. 3. Layering  right pleural effusion. Electronically Signed   By: Jorje Guild M.D.   On: 01/21/2022 12:10   CT HEAD CODE STROKE WO CONTRAST  Result Date: 01/21/2022 CLINICAL DATA:  Code stroke.  Left-sided weakness EXAM: CT HEAD WITHOUT CONTRAST TECHNIQUE: Contiguous axial images were obtained from the base of the skull through the vertex without intravenous contrast. RADIATION DOSE REDUCTION: This exam was performed according to the departmental dose-optimization program which includes automated exposure control, adjustment of the mA and/or kV according to patient size and/or use of iterative reconstruction technique. COMPARISON:  None Available. FINDINGS: Brain: No hemorrhage or convincing acute infarction. Remote left occipital parietal cortex infarct. No hydrocephalus  or masslike finding Vascular: Only seen on reformats (due to slice spacing) there is a dense appearance at the distal right MCA Skull: Heterogeneous calvarial density diffusely, likely renal osteodystrophy. Sinuses/Orbits: No acute finding Other: These results were communicated to Dr. Leonel Ramsay at 11:55 am on 01/21/2022 by text page via the [facility] messaging system. ASPECTS [facility] Stroke Program Early CT Score) -when compared to gray-white differentiation on the left - Ganglionic level infarction (caudate, lentiform nuclei, internal capsule, insula, M1-M3 cortex): 7 - Supraganglionic infarction (M4-M6 cortex): 3 Total score (0-10 with 10 being normal): 10 IMPRESSION: 1. Possible dense right MCA. 2. No intracranial hemorrhage.  ASPECTS is 10. 3. Remote left occipital parietal cortex infarction. Electronically Signed   By: Jorje Guild M.D.   On: 01/21/2022 11:57    Medications:     stroke: early stages of recovery book   Does not apply Once   amitriptyline  25 mg Oral QHS   aspirin  81 mg Oral Daily   Chlorhexidine Gluconate Cloth  6 each Topical Daily   cinacalcet  30 mg Oral Once in dialysis   clopidogrel  75 mg Oral Daily   insulin aspart  0-15 Units Subcutaneous Q4H   metoprolol succinate  200 mg Oral Daily   montelukast  10 mg Oral QHS   pantoprazole  40 mg Oral Q supper   pravastatin  40 mg Oral q1800   sodium chloride flush  3 mL Intravenous Once

## 2022-01-24 ENCOUNTER — Other Ambulatory Visit (HOSPITAL_COMMUNITY): Payer: Self-pay

## 2022-01-24 DIAGNOSIS — E785 Hyperlipidemia, unspecified: Secondary | ICD-10-CM

## 2022-01-24 DIAGNOSIS — E669 Obesity, unspecified: Secondary | ICD-10-CM

## 2022-01-24 LAB — GLUCOSE, CAPILLARY
Glucose-Capillary: 86 mg/dL (ref 70–99)
Glucose-Capillary: 124 mg/dL — ABNORMAL HIGH (ref 70–99)

## 2022-01-24 MED ORDER — CLOPIDOGREL BISULFATE 75 MG PO TABS
75.0000 mg | ORAL_TABLET | Freq: Every day | ORAL | 0 refills | Status: DC
  Filled 2022-01-24: qty 21, 21d supply, fill #0

## 2022-01-24 MED ORDER — MONTELUKAST SODIUM 10 MG PO TABS
10.0000 mg | ORAL_TABLET | Freq: Every day | ORAL | 0 refills | Status: DC
  Filled 2022-01-24: qty 30, 30d supply, fill #0

## 2022-01-24 MED ORDER — METOPROLOL SUCCINATE ER 200 MG PO TB24
200.0000 mg | ORAL_TABLET | Freq: Every day | ORAL | 0 refills | Status: DC
  Filled 2022-01-24: qty 30, 30d supply, fill #0

## 2022-01-24 MED ORDER — ASPIRIN 81 MG PO CHEW
81.0000 mg | CHEWABLE_TABLET | Freq: Every day | ORAL | 0 refills | Status: DC
  Filled 2022-01-24: qty 30, 30d supply, fill #0

## 2022-01-24 MED ORDER — PRAVASTATIN SODIUM 40 MG PO TABS
40.0000 mg | ORAL_TABLET | Freq: Every day | ORAL | 0 refills | Status: DC
  Filled 2022-01-24: qty 30, 30d supply, fill #0

## 2022-01-24 NOTE — Progress Notes (Signed)
[facility] Progress Note   Dialysis Orders: [facility]TS - 4hrs; BFR 400; DFR 600; 2K/2.5Ca; EDW 105kg -Heparin 2000 unit bolus with HD -Sensipar 30mg  with HD -Calcitriol 0.69mcg with HD-last dose 01/19/22 -Venofer 100mg  IV X 5 doses (new order, suppose to start 6/10) -Micera 217mcg Q2wks-last dose 01/14/22  Assessment/Plan: Arterial Ischemic CVA/MCA-s/p mechanical thrombectomy 6/10 in IR; on 2nd CVA prevention: ASA, Brillinta, statin; ECHO pending; MRI and swallow eval ordered; followed by critical care team ESRD - on HD TTS; no urgent need for HD today. She finished HD at 4AM. Appreciate renal navigator; she will receive HD Wed 530am as outpt. Therefore will not hd again today esp given she finished at 4AM. She's at her EDW. Hypertension/volume-patient remains euvolemic on exam and Bps stable.  Monitor weight trends closely. Anemia of CKD - Hgb 11.9; ESA last given 01/14/22. No indication to resume ESA or Fe at this time.  5.   Secondary Hyperparathyroidism - Corr Ca 10.2 and PO4 elevated. Will resume Sensipar and hold Calcitriol for now. 6.   Nutrition - Advance to renal diet once clinically stable  Subjective:    Seen and examined patient who was sitting in a chair.  BP stable. Denies SOB, CP, and N/V.   Objective Vitals:   01/24/22 0310 01/24/22 0315 01/24/22 0406 01/24/22 0732  BP: 100/69 112/62 127/68 135/69  Pulse: 76 78 79 80  Resp: (!) 22 (!) 22 20 14   Temp:   97.9 F (36.6 C) 98.6 F (37 C)  TempSrc:   Oral Oral  SpO2: 94% 91% 97% 94%  Weight:  105.6 kg     Physical Exam General: Well-appearing; on RA; NAD Heart: Normal S1 and S2; No murmurs, gallops, or rubs Lungs: Clear throughout; No wheezing, rales, or rhonchi Abdomen: Soft and non-tender Extremities: Bilateral BKAs-no edema at stumps Dialysis Access: AVF (+) B/T, transposed lt BCF  Filed Weights   01/21/22 1100 01/24/22 0315  Weight: 111.6 kg  105.6 kg    Intake/Output Summary (Last 24 hours) at 01/24/2022 1002 Last data filed at 01/23/2022 1800 Gross per 24 hour  Intake 50 ml  Output --  Net 50 ml    Additional Objective Labs: Basic Metabolic Panel: Recent Labs  Lab 01/21/22 1147 01/21/22 1154 01/22/22 1018 01/23/22 0526  NA 137 138 135 135  K 3.7 3.7 4.2 4.6  CL 98 97* 95* 94*  CO2 27  --  22 25  GLUCOSE 147* 142* 178* 85  BUN 47* 44* 60* 71*  CREATININE 9.16* 9.90* 10.07* 11.33*  CALCIUM 9.6  --  9.5 9.8  PHOS  --   --  6.3* 7.1*   Liver Function Tests: Recent Labs  Lab 01/21/22 1147 01/22/22 1018 01/23/22 0526  AST 12*  --   --   ALT 9  --   --   ALKPHOS 153*  --   --   BILITOT 0.6  --   --   PROT 7.2  --   --   ALBUMIN 3.5 3.1* 3.4*   No results for input(s): "LIPASE", "AMYLASE" in the last 168 hours. CBC: Recent Labs  Lab 01/21/22 1147 01/21/22 1154 01/23/22 0526  WBC 8.0  --  8.3  NEUTROABS 4.4  --  4.6  HGB 10.9* 11.9* 10.0*  HCT 35.4* 35.0* 32.7*  MCV 94.4  --  92.6  PLT 181  --  176   Blood Culture No results found for: "SDES", "SPECREQUEST", "CULT", "REPTSTATUS"  Cardiac Enzymes: No results for input(s): "CKTOTAL", "CKMB", "CKMBINDEX", "TROPONINI" in the last 168 hours. CBG: Recent Labs  Lab 01/23/22 0745 01/23/22 1134 01/23/22 1546 01/23/22 2027 01/24/22 0411  GLUCAP 77 87 109* 123* 86   Iron Studies: No results for input(s): "IRON", "TIBC", "TRANSFERRIN", "FERRITIN" in the last 72 hours. Lab Results  Component Value Date   INR 1.2 01/21/2022   Studies/Results: ECHOCARDIOGRAM COMPLETE  Result Date: 01/22/2022    ECHOCARDIOGRAM REPORT   Patient Name:   Cindy Osborne Date of Exam: 01/22/2022 Medical Rec #:  [id number]         Height:       69.0 in Accession #:    [id number]        Weight:       246.0 lb Date of Birth:  25-Jul-1966          BSA:          2.256 m Patient Age:    56 years          BP:           122/61 mmHg Patient Gender: F                 HR:            72 bpm. Exam Location:  Inpatient Procedure: 2D Echo, Cardiac Doppler and Color Doppler Indications:    Stroke  History:        Patient has prior history of Echocardiogram examinations, most                 recent 10/31/2021.  Sonographer:    Merrie Roof RDCS Referring Phys: [phone] [facility] [place]  1. Left ventricular ejection fraction, by estimation, is 55 to 60%. The left ventricle has normal function. The left ventricle has no regional wall motion abnormalities. The left ventricular internal cavity size was mildly dilated. There is moderate concentric left ventricular hypertrophy. Left ventricular diastolic parameters are consistent with Grade II diastolic dysfunction (pseudonormalization). Elevated left ventricular end-diastolic pressure.  2. Right ventricular systolic function is normal. The right ventricular size is mildly enlarged. Tricuspid regurgitation signal is inadequate for assessing PA pressure.  3. Left atrial size was moderately dilated.  4. The mitral valve is abnormal. Mild to moderate mitral valve regurgitation. No evidence of mitral stenosis. Moderate to severe mitral annular calcification.  5. The aortic valve is tricuspid. There is moderate calcification of the aortic valve. There is mild thickening of the aortic valve. Aortic valve regurgitation is not visualized. Mild aortic valve stenosis.  6. The inferior vena cava is normal in size with greater than 50% respiratory variability, suggesting right atrial pressure of 3 mmHg. Comparison(s): Prior images unable to be directly viewed, comparison made by report only. [facility] echo 10/31/21: EF 55%, moderate LVH, mild MR, trivial TR, RVSP 54 mmHg. Conclusion(s)/Recommendation(s): No intracardiac source of embolism detected on this transthoracic study. Consider a transesophageal echocardiogram to exclude cardiac source of embolism if clinically indicated. FINDINGS  Left Ventricle: Left ventricular ejection fraction, by estimation, is 55 to  60%. The left ventricle has normal function. The left ventricle has no regional wall motion abnormalities. The left ventricular internal cavity size was mildly dilated. There is  moderate concentric left ventricular hypertrophy. Left ventricular diastolic parameters are consistent with Grade II diastolic dysfunction (pseudonormalization). Elevated left ventricular end-diastolic pressure. Right Ventricle: The right ventricular size is mildly enlarged. Right vetricular wall thickness was not well visualized. Right ventricular systolic function is normal.  Tricuspid regurgitation signal is inadequate for assessing PA pressure. Left Atrium: Left atrial size was moderately dilated. Right Atrium: Right atrial size was normal in size. Pericardium: There is no evidence of pericardial effusion. Mitral Valve: The mitral valve is abnormal. There is moderate thickening of the mitral valve leaflet(s). There is moderate calcification of the mitral valve leaflet(s). Moderate to severe mitral annular calcification. Mild to moderate mitral valve regurgitation. No evidence of mitral valve stenosis. Tricuspid Valve: The tricuspid valve is grossly normal. Tricuspid valve regurgitation is trivial. No evidence of tricuspid stenosis. Aortic Valve: The aortic valve is tricuspid. There is moderate calcification of the aortic valve. There is mild thickening of the aortic valve. Aortic valve regurgitation is not visualized. Mild aortic stenosis is present. Aortic valve mean gradient measures 9.0 mmHg. Aortic valve peak gradient measures 15.7 mmHg. Aortic valve area, by VTI measures 1.40 cm. Pulmonic Valve: The pulmonic valve was not well visualized. Pulmonic valve regurgitation is trivial. No evidence of pulmonic stenosis. Aorta: The aortic root and ascending aorta are structurally normal, with no evidence of dilitation. Venous: The inferior vena cava is normal in size with greater than 50% respiratory variability, suggesting right atrial  pressure of 3 mmHg. IAS/Shunts: The interatrial septum was not well visualized.  LEFT VENTRICLE PLAX 2D LVIDd:         5.30 cm   Diastology LVIDs:         3.90 cm   LV e' medial:    5.70 cm/s LV PW:         1.40 cm   LV E/e' medial:  28.2 LV IVS:        1.20 cm   LV e' lateral:   6.30 cm/s LVOT diam:     2.00 cm   LV E/e' lateral: 25.6 LV SV:         59 LV SV Index:   26 LVOT Area:     3.14 cm  RIGHT VENTRICLE             IVC RV Basal diam:  4.50 cm     IVC diam: 2.00 cm RV S prime:     11.40 cm/s TAPSE (M-mode): 2.4 cm LEFT ATRIUM              Index        RIGHT ATRIUM           Index LA diam:        5.00 cm  2.22 cm/m   RA Area:     18.20 cm LA Vol (A2C):   109.0 ml 48.31 ml/m  RA Volume:   52.10 ml  23.09 ml/m LA Vol (A4C):   68.9 ml  30.54 ml/m LA Biplane Vol: 89.6 ml  39.71 ml/m  AORTIC VALVE AV Area (Vmax):    1.36 cm AV Area (Vmean):   1.29 cm AV Area (VTI):     1.40 cm AV Vmax:           198.00 cm/s AV Vmean:          138.000 cm/s AV VTI:            0.423 m AV Peak Grad:      15.7 mmHg AV Mean Grad:      9.0 mmHg LVOT Vmax:         85.40 cm/s LVOT Vmean:        56.600 cm/s LVOT VTI:          0.188 m LVOT/AV  VTI ratio: 0.44  AORTA Ao Root diam: 3.00 cm Ao Asc diam:  3.40 cm MITRAL VALVE MV Area (PHT): 3.02 cm       SHUNTS MV Decel Time: 251 msec       Systemic VTI:  0.19 m MR Peak grad:    91.4 mmHg    Systemic Diam: 2.00 cm MR Mean grad:    60.0 mmHg MR Vmax:         478.00 cm/s MR Vmean:        364.0 cm/s MR PISA:         2.26 cm MR PISA Eff ROA: 16 mm MR PISA Radius:  0.60 cm MV E velocity: 161.00 cm/s MV A velocity: 83.80 cm/s MV E/A ratio:  1.92 Buford Dresser MD Electronically signed by Buford Dresser MD Signature Date/Time: 01/22/2022/3:55:40 PM    Final    MR BRAIN WO CONTRAST  Result Date: 01/22/2022 CLINICAL DATA:  Stroke follow-up. Acute onset left-sided weakness and sensory deficit at dialysis appointment EXAM: MRI HEAD WITHOUT CONTRAST MRA HEAD WITHOUT CONTRAST  TECHNIQUE: Multiplanar, multi-echo pulse sequences of the brain and surrounding structures were acquired without intravenous contrast. Angiographic images of the Circle of Willis were acquired using MRA technique without intravenous contrast. COMPARISON:  Head CT and CTA from yesterday. FINDINGS: MRI HEAD FINDINGS Brain: Scattered areas of acute cortical infarction in the right insula, posterior temporal, and frontal cortex, overall small volume. Mild white matter infarction in the right occipital parietal region. No acute hemorrhage, hydrocephalus, or collection. Left parietal white matter FLAIR hyperintensity likely from old ischemic insult. Vascular: Arterial findings below. Skull and upper cervical spine: Normal marrow signal Sinuses/Orbits: Negative MRA HEAD FINDINGS Anterior circulation: Atheromatous irregularity of the carotid siphons. Re-established flow in the right MCA at site of previously seen embolism. No downstream or contralateral embolic disease noted. Negative for aneurysm. Posterior circulation: Codominant vertebral arteries. The vertebral and basilar arteries are smoothly contoured and widely patent. No branch occlusion, beading, or aneurysm IMPRESSION: Brain MRI: Small areas of acute infarction in the right MCA distribution, primarily cortical and limited in extent when compared to the level of prior occlusion. MRA: 1. Normalized appearance of the right MCA bifurcation after embolectomy. No evidence of interval embolic disease. 2. Atheromatous irregularity of the carotid siphons. Electronically Signed   By: Jorje Guild M.D.   On: 01/22/2022 12:26   MR ANGIO HEAD WO CONTRAST  Result Date: 01/22/2022 CLINICAL DATA:  Stroke follow-up. Acute onset left-sided weakness and sensory deficit at dialysis appointment EXAM: MRI HEAD WITHOUT CONTRAST MRA HEAD WITHOUT CONTRAST TECHNIQUE: Multiplanar, multi-echo pulse sequences of the brain and surrounding structures were acquired without intravenous  contrast. Angiographic images of the Circle of Willis were acquired using MRA technique without intravenous contrast. COMPARISON:  Head CT and CTA from yesterday. FINDINGS: MRI HEAD FINDINGS Brain: Scattered areas of acute cortical infarction in the right insula, posterior temporal, and frontal cortex, overall small volume. Mild white matter infarction in the right occipital parietal region. No acute hemorrhage, hydrocephalus, or collection. Left parietal white matter FLAIR hyperintensity likely from old ischemic insult. Vascular: Arterial findings below. Skull and upper cervical spine: Normal marrow signal Sinuses/Orbits: Negative MRA HEAD FINDINGS Anterior circulation: Atheromatous irregularity of the carotid siphons. Re-established flow in the right MCA at site of previously seen embolism. No downstream or contralateral embolic disease noted. Negative for aneurysm. Posterior circulation: Codominant vertebral arteries. The vertebral and basilar arteries are smoothly contoured and widely patent. No branch occlusion, beading, or  aneurysm IMPRESSION: Brain MRI: Small areas of acute infarction in the right MCA distribution, primarily cortical and limited in extent when compared to the level of prior occlusion. MRA: 1. Normalized appearance of the right MCA bifurcation after embolectomy. No evidence of interval embolic disease. 2. Atheromatous irregularity of the carotid siphons. Electronically Signed   By: Jorje Guild M.D.   On: 01/22/2022 12:26    Medications:    amitriptyline  25 mg Oral QHS   aspirin  81 mg Oral Daily   Chlorhexidine Gluconate Cloth  6 each Topical Daily   Chlorhexidine Gluconate Cloth  6 each Topical Q0600   clopidogrel  75 mg Oral Daily   insulin aspart  0-15 Units Subcutaneous Q4H   metoprolol succinate  200 mg Oral Daily   montelukast  10 mg Oral QHS   pantoprazole  40 mg Oral Q supper   pravastatin  40 mg Oral q1800   sodium chloride flush  3 mL Intravenous Once

## 2022-01-24 NOTE — TOC Transition Note (Signed)
Transition of Care [facility]) - CM/SW Discharge Note   Patient Details  Name: Baili Stang MRN: [id number] Date of Birth: [birth year]  [facility] CM/SW Contact:  Pollie Friar, RN Phone Number: 01/24/2022, 11:59 AM   Clinical Narrative:    Patient to receive HD at her outpatient clinic tomorrow. [facility] SW has made the arrangements with information on the AVS.  Pt has all needed DME at home and no f/u per PT/OT.  Pt has transportation home today.    Final next level of care: Home/Self Care Barriers to Discharge: No Barriers Identified   Patient Goals and CMS Choice        Discharge Placement                       Discharge Plan and Services                                     Social Determinants of Health (SDOH) Interventions     Readmission Risk Interventions     No data to display

## 2022-01-24 NOTE — Discharge Summary (Signed)
Stroke Discharge Summary  Patient ID: Cindy Osborne   MRN: [id number]      DOB: 08-09-1966  Date of Admission: 01/21/2022 Date of Discharge: 01/24/2022  Attending Physician:  Stroke, Md, MD, Stroke MD Consultant(s):   Treatment Team:  Donato Heinz, MD  Patient's PCP:  Glean Hess, MD  DISCHARGE DIAGNOSIS: Right MCA ischemic stroke. Principal Problem:   Arterial ischemic stroke, MCA (middle cerebral artery), right, acute (HCC) Active Problems:   Anemia associated with chronic renal failure   Type II diabetes mellitus with complication (HCC)   CKD (chronic kidney disease), stage V (HCC)   Hypertension   Hyperlipidemia   Obesity     LABORATORY STUDIES CBC    Component Value Date/Time   WBC 8.3 01/23/2022 0526   RBC 3.53 (L) 01/23/2022 0526   HGB 10.0 (L) 01/23/2022 0526   HGB 10.3 (L) 02/26/2020 1028   HCT 32.7 (L) 01/23/2022 0526   HCT 30.7 (L) 02/26/2020 1028   PLT 176 01/23/2022 0526   PLT 222 02/26/2020 1028   MCV 92.6 01/23/2022 0526   MCV 91 02/26/2020 1028   MCH 28.3 01/23/2022 0526   MCHC 30.6 01/23/2022 0526   RDW 15.8 (H) 01/23/2022 0526   RDW 11.8 02/26/2020 1028   LYMPHSABS 2.7 01/23/2022 0526   LYMPHSABS 3.2 (H) 02/26/2020 1028   MONOABS 0.4 01/23/2022 0526   EOSABS 0.5 01/23/2022 0526   EOSABS 0.3 02/26/2020 1028   BASOSABS 0.0 01/23/2022 0526   BASOSABS 0.0 02/26/2020 1028   CMP    Component Value Date/Time   NA 135 01/23/2022 0526   NA 136 (A) 02/02/2021 0000   K 4.6 01/23/2022 0526   CL 94 (L) 01/23/2022 0526   CO2 25 01/23/2022 0526   GLUCOSE 85 01/23/2022 0526   BUN 71 (H) 01/23/2022 0526   BUN 112 (A) 02/02/2021 0000   CREATININE 11.33 (H) 01/23/2022 0526   CALCIUM 9.8 01/23/2022 0526   PROT 7.2 01/21/2022 1147   PROT 7.1 06/30/2020 1510   ALBUMIN 3.4 (L) 01/23/2022 0526   ALBUMIN 4.2 06/30/2020 1510   AST 12 (L) 01/21/2022 1147   ALT 9 01/21/2022 1147   ALKPHOS 153 (H) 01/21/2022 1147   BILITOT 0.6 01/21/2022  1147   BILITOT 0.2 06/30/2020 1510   GFRNONAA 4 (L) 01/23/2022 0526   GFRAA 4 02/02/2021 0000   COAGS Lab Results  Component Value Date   INR 1.2 01/21/2022   Lipid Panel    Component Value Date/Time   CHOL 134 01/22/2022 0328   CHOL 168 03/03/2021 0858   TRIG 93 01/22/2022 0328   HDL 36 (L) 01/22/2022 0328   HDL 35 (L) 03/03/2021 0858   CHOLHDL 3.7 01/22/2022 0328   VLDL 19 01/22/2022 0328   LDLCALC 79 01/22/2022 0328   LDLCALC 113 (H) 03/03/2021 0858   HgbA1C  Lab Results  Component Value Date   HGBA1C 6.0 (H) 01/22/2022   Urinalysis    Component Value Date/Time   BILIRUBINUR neg 06/09/2021 1128   PROTEINUR Positive (A) 06/09/2021 1128   UROBILINOGEN 0.2 06/09/2021 1128   NITRITE neg' 06/09/2021 1128   LEUKOCYTESUR Large (3+) (A) 06/09/2021 1128   Urine Drug Screen No results found for: "LABOPIA", "COCAINSCRNUR", "LABBENZ", "AMPHETMU", "THCU", "LABBARB"  Alcohol Level No results found for: "ETH"   SIGNIFICANT DIAGNOSTIC STUDIES ECHOCARDIOGRAM COMPLETE  Result Date: 01/22/2022    ECHOCARDIOGRAM REPORT   Patient Name:   Cindy Osborne Date of Exam: 01/22/2022 Medical Rec #:  [id number]         Height:       69.0 in Accession #:    [id number]        Weight:       246.0 lb Date of Birth:  1965-10-26          BSA:          2.256 m Patient Age:    56 years          BP:           122/61 mmHg Patient Gender: F                 HR:           72 bpm. Exam Location:  Inpatient Procedure: 2D Echo, Cardiac Doppler and Color Doppler Indications:    Stroke  History:        Patient has prior history of Echocardiogram examinations, most                 recent 10/31/2021.  Sonographer:    Merrie Roof RDCS Referring Phys: [id number] MCNEILL P [place]  1. Left ventricular ejection fraction, by estimation, is 55 to 60%. The left ventricle has normal function. The left ventricle has no regional wall motion abnormalities. The left ventricular internal cavity size was mildly dilated.  There is moderate concentric left ventricular hypertrophy. Left ventricular diastolic parameters are consistent with Grade II diastolic dysfunction (pseudonormalization). Elevated left ventricular end-diastolic pressure.  2. Right ventricular systolic function is normal. The right ventricular size is mildly enlarged. Tricuspid regurgitation signal is inadequate for assessing PA pressure.  3. Left atrial size was moderately dilated.  4. The mitral valve is abnormal. Mild to moderate mitral valve regurgitation. No evidence of mitral stenosis. Moderate to severe mitral annular calcification.  5. The aortic valve is tricuspid. There is moderate calcification of the aortic valve. There is mild thickening of the aortic valve. Aortic valve regurgitation is not visualized. Mild aortic valve stenosis.  6. The inferior vena cava is normal in size with greater than 50% respiratory variability, suggesting right atrial pressure of 3 mmHg. Comparison(s): Prior images unable to be directly viewed, comparison made by report only. [facility] echo 10/31/21: EF 55%, moderate LVH, mild MR, trivial TR, RVSP 54 mmHg. Conclusion(s)/Recommendation(s): No intracardiac source of embolism detected on this transthoracic study. Consider a transesophageal echocardiogram to exclude cardiac source of embolism if clinically indicated. FINDINGS  Left Ventricle: Left ventricular ejection fraction, by estimation, is 55 to 60%. The left ventricle has normal function. The left ventricle has no regional wall motion abnormalities. The left ventricular internal cavity size was mildly dilated. There is  moderate concentric left ventricular hypertrophy. Left ventricular diastolic parameters are consistent with Grade II diastolic dysfunction (pseudonormalization). Elevated left ventricular end-diastolic pressure. Right Ventricle: The right ventricular size is mildly enlarged. Right vetricular wall thickness was not well visualized. Right ventricular systolic  function is normal. Tricuspid regurgitation signal is inadequate for assessing PA pressure. Left Atrium: Left atrial size was moderately dilated. Right Atrium: Right atrial size was normal in size. Pericardium: There is no evidence of pericardial effusion. Mitral Valve: The mitral valve is abnormal. There is moderate thickening of the mitral valve leaflet(s). There is moderate calcification of the mitral valve leaflet(s). Moderate to severe mitral annular calcification. Mild to moderate mitral valve regurgitation. No evidence of mitral valve stenosis. Tricuspid Valve: The tricuspid valve is grossly normal. Tricuspid valve regurgitation is trivial. No evidence of tricuspid stenosis.  Aortic Valve: The aortic valve is tricuspid. There is moderate calcification of the aortic valve. There is mild thickening of the aortic valve. Aortic valve regurgitation is not visualized. Mild aortic stenosis is present. Aortic valve mean gradient measures 9.0 mmHg. Aortic valve peak gradient measures 15.7 mmHg. Aortic valve area, by VTI measures 1.40 cm. Pulmonic Valve: The pulmonic valve was not well visualized. Pulmonic valve regurgitation is trivial. No evidence of pulmonic stenosis. Aorta: The aortic root and ascending aorta are structurally normal, with no evidence of dilitation. Venous: The inferior vena cava is normal in size with greater than 50% respiratory variability, suggesting right atrial pressure of 3 mmHg. IAS/Shunts: The interatrial septum was not well visualized.  LEFT VENTRICLE PLAX 2D LVIDd:         5.30 cm   Diastology LVIDs:         3.90 cm   LV e' medial:    5.70 cm/s LV PW:         1.40 cm   LV E/e' medial:  28.2 LV IVS:        1.20 cm   LV e' lateral:   6.30 cm/s LVOT diam:     2.00 cm   LV E/e' lateral: 25.6 LV SV:         59 LV SV Index:   26 LVOT Area:     3.14 cm  RIGHT VENTRICLE             IVC RV Basal diam:  4.50 cm     IVC diam: 2.00 cm RV S prime:     11.40 cm/s TAPSE (M-mode): 2.4 cm LEFT ATRIUM               Index        RIGHT ATRIUM           Index LA diam:        5.00 cm  2.22 cm/m   RA Area:     18.20 cm LA Vol (A2C):   109.0 ml 48.31 ml/m  RA Volume:   52.10 ml  23.09 ml/m LA Vol (A4C):   68.9 ml  30.54 ml/m LA Biplane Vol: 89.6 ml  39.71 ml/m  AORTIC VALVE AV Area (Vmax):    1.36 cm AV Area (Vmean):   1.29 cm AV Area (VTI):     1.40 cm AV Vmax:           198.00 cm/s AV Vmean:          138.000 cm/s AV VTI:            0.423 m AV Peak Grad:      15.7 mmHg AV Mean Grad:      9.0 mmHg LVOT Vmax:         85.40 cm/s LVOT Vmean:        56.600 cm/s LVOT VTI:          0.188 m LVOT/AV VTI ratio: 0.44  AORTA Ao Root diam: 3.00 cm Ao Asc diam:  3.40 cm MITRAL VALVE MV Area (PHT): 3.02 cm       SHUNTS MV Decel Time: 251 msec       Systemic VTI:  0.19 m MR Peak grad:    91.4 mmHg    Systemic Diam: 2.00 cm MR Mean grad:    60.0 mmHg MR Vmax:         478.00 cm/s MR Vmean:        364.0 cm/s MR PISA:  2.26 cm MR PISA Eff ROA: 16 mm MR PISA Radius:  0.60 cm MV E velocity: 161.00 cm/s MV A velocity: 83.80 cm/s MV E/A ratio:  1.92 Buford Dresser MD Electronically signed by Buford Dresser MD Signature Date/Time: 01/22/2022/3:55:40 PM    Final    MR BRAIN WO CONTRAST  Result Date: 01/22/2022 CLINICAL DATA:  Stroke follow-up. Acute onset left-sided weakness and sensory deficit at dialysis appointment EXAM: MRI HEAD WITHOUT CONTRAST MRA HEAD WITHOUT CONTRAST TECHNIQUE: Multiplanar, multi-echo pulse sequences of the brain and surrounding structures were acquired without intravenous contrast. Angiographic images of the Circle of Willis were acquired using MRA technique without intravenous contrast. COMPARISON:  Head CT and CTA from yesterday. FINDINGS: MRI HEAD FINDINGS Brain: Scattered areas of acute cortical infarction in the right insula, posterior temporal, and frontal cortex, overall small volume. Mild white matter infarction in the right occipital parietal region. No acute hemorrhage,  hydrocephalus, or collection. Left parietal white matter FLAIR hyperintensity likely from old ischemic insult. Vascular: Arterial findings below. Skull and upper cervical spine: Normal marrow signal Sinuses/Orbits: Negative MRA HEAD FINDINGS Anterior circulation: Atheromatous irregularity of the carotid siphons. Re-established flow in the right MCA at site of previously seen embolism. No downstream or contralateral embolic disease noted. Negative for aneurysm. Posterior circulation: Codominant vertebral arteries. The vertebral and basilar arteries are smoothly contoured and widely patent. No branch occlusion, beading, or aneurysm IMPRESSION: Brain MRI: Small areas of acute infarction in the right MCA distribution, primarily cortical and limited in extent when compared to the level of prior occlusion. MRA: 1. Normalized appearance of the right MCA bifurcation after embolectomy. No evidence of interval embolic disease. 2. Atheromatous irregularity of the carotid siphons. Electronically Signed   By: Jorje Guild M.D.   On: 01/22/2022 12:26   MR ANGIO HEAD WO CONTRAST  Result Date: 01/22/2022 CLINICAL DATA:  Stroke follow-up. Acute onset left-sided weakness and sensory deficit at dialysis appointment EXAM: MRI HEAD WITHOUT CONTRAST MRA HEAD WITHOUT CONTRAST TECHNIQUE: Multiplanar, multi-echo pulse sequences of the brain and surrounding structures were acquired without intravenous contrast. Angiographic images of the Circle of Willis were acquired using MRA technique without intravenous contrast. COMPARISON:  Head CT and CTA from yesterday. FINDINGS: MRI HEAD FINDINGS Brain: Scattered areas of acute cortical infarction in the right insula, posterior temporal, and frontal cortex, overall small volume. Mild white matter infarction in the right occipital parietal region. No acute hemorrhage, hydrocephalus, or collection. Left parietal white matter FLAIR hyperintensity likely from old ischemic insult. Vascular:  Arterial findings below. Skull and upper cervical spine: Normal marrow signal Sinuses/Orbits: Negative MRA HEAD FINDINGS Anterior circulation: Atheromatous irregularity of the carotid siphons. Re-established flow in the right MCA at site of previously seen embolism. No downstream or contralateral embolic disease noted. Negative for aneurysm. Posterior circulation: Codominant vertebral arteries. The vertebral and basilar arteries are smoothly contoured and widely patent. No branch occlusion, beading, or aneurysm IMPRESSION: Brain MRI: Small areas of acute infarction in the right MCA distribution, primarily cortical and limited in extent when compared to the level of prior occlusion. MRA: 1. Normalized appearance of the right MCA bifurcation after embolectomy. No evidence of interval embolic disease. 2. Atheromatous irregularity of the carotid siphons. Electronically Signed   By: Jorje Guild M.D.   On: 01/22/2022 12:26   IR PERCUTANEOUS ART THROMBECTOMY/INFUSION INTRACRANIAL INC DIAG ANGIO  Result Date: 01/21/2022 INDICATION: Patient with MCA syndrome, distal M1 occlusion, left hemiplegia EXAM: 1. EMERGENT LARGE VESSEL OCCLUSION THROMBOLYSIS (ANTERIOR CIRCULATION) 2. MECHANICAL THROMBECTOMY  OF THE DISTAL M1 OCCLUSION BY THE ADAPT (A DIRECT ASPIRATION FIRST PASS TECHNIQUE) TECHNIQUE COMPARISON:  Correlation is made with a CTA of the head and neck performed on the same day earlier MEDICATIONS: Injection Ancef 2 g was administered within 1 hour of the procedure ANESTHESIA/SEDATION: General anesthesia CONTRAST:  60 mL of Omnipaque 300 FLUOROSCOPY TIME:  Fluoroscopy Time: 10 minutes 42 seconds (581 mGy). COMPLICATIONS: None immediate. TECHNIQUE: As the patient's family was not available and patient was not in a position to understand and consent for the procedure, procedure was performed under an emergent consent. The patient was then put under general anesthesia by the Department of Anesthesiology at [facility] The right groin was prepped and draped in the usual sterile fashion. Thereafter using modified Seldinger technique, transfemoral access into the right common femoral artery was obtained without difficulty. Over a 0.035 inch guidewire a 8 French x 25 cm pinnacle was inserted. Through this, and also over a 0.035 inch glidewire a a walrus balloon guide catheter with a 6 French neuron select catheter was advanced to the aortic arch region and selective catheterization of the right common carotid artery followed by angiogram was performed. Then, under roadmap guidance, the guide catheter was advanced into the distal intracranial internal carotid artery. Next, zoom 55 and phenom 21 microcatheter and synchro 14 microwire were utilized and under roadmap guidance, microcatheter and zoom 55 were advanced into the distal intracranial ICA. Biplane DSA cerebral angiography of the right ICA injection was performed over the head. Then, under roadmap guidance, microwire and microcatheter were navigated into the M1 segment of the right MCA. The microwire was advanced into the M2-M3 segment past the occlusion at the distal M1 segment. Next, zoom 55 was advanced with its distal end at the site of the occlusion of the distal M1. The microcatheter and wire were removed and the zoom 55 was connected to the penumbra aspiration suction system. After 3 minutes of suction, the zoom 55 catheter was removed the walrus guide catheter was disconnected at the hub, 10 mL of blood was aspirated and then DSA cerebral angiogram was performed. Next, repeat DSA cerebral angiogram was performed after about 10 minutes to confirm the continued patency of the right MCA and its branches. Subsequently, dynamic CT head was performed. Next, the femoral sheath was partially withdrawn with its distal end in the external iliac artery and right femoral angiogram was performed and the femoral access site was closed by using an 8 [place] Angio-Seal device.  Patient was then extubated and was transferred to the ICU in stable condition. FINDINGS: Right common carotid angiogram demonstrated common, internal and external carotid arteries to be widely patent without significant stenosis. Mild atheromatous disease. Biplane DSA cerebral angiography demonstrated distal intracranial internal carotid artery to be widely patent. Anterior cerebral artery and its branches have a normal appearance. There is occlusion at the distal M1 immediately distal to the origin of the anterior temporal artery. Post aspiration thrombectomy cerebral angiogram demonstrated complete revascularization with TICI 3 results. The right middle cerebral artery and its branches have a normal appearance. The venous phase of the angiogram is unremarkable with patent superior sagittal, inferior sagittal, transverse, sigmoid sinuses. No aneurysm, av malformation or dural fistula seen. Dyna CT of the head demonstrated no intracranial bleed. IMPRESSION: Successful mechanical thrombectomy for distal right M1 occlusion of the MCA by direct aspiration first pass technique with TICI 3 results. PLAN: Patient will be admitted in the ICU for close monitoring. Systolic blood  pressure goal to be at 120-140 mm Hg. Electronically Signed   By: Frazier Richards M.D.   On: 01/21/2022 15:23   IR CT Head Ltd  Result Date: 01/21/2022 INDICATION: Patient with MCA syndrome, distal M1 occlusion, left hemiplegia EXAM: 1. EMERGENT LARGE VESSEL OCCLUSION THROMBOLYSIS (ANTERIOR CIRCULATION) 2. MECHANICAL THROMBECTOMY OF THE DISTAL M1 OCCLUSION BY THE ADAPT (A DIRECT ASPIRATION FIRST PASS TECHNIQUE) TECHNIQUE COMPARISON:  Correlation is made with a CTA of the head and neck performed on the same day earlier MEDICATIONS: Injection Ancef 2 g was administered within 1 hour of the procedure ANESTHESIA/SEDATION: General anesthesia CONTRAST:  60 mL of Omnipaque 300 FLUOROSCOPY TIME:  Fluoroscopy Time: 10 minutes 42 seconds (581 mGy).  COMPLICATIONS: None immediate. TECHNIQUE: As the patient's family was not available and patient was not in a position to understand and consent for the procedure, procedure was performed under an emergent consent. The patient was then put under general anesthesia by the Department of Anesthesiology at [facility]. The right groin was prepped and draped in the usual sterile fashion. Thereafter using modified Seldinger technique, transfemoral access into the right common femoral artery was obtained without difficulty. Over a 0.035 inch guidewire a 8 French x 25 cm pinnacle was inserted. Through this, and also over a 0.035 inch glidewire a a walrus balloon guide catheter with a 6 French neuron select catheter was advanced to the aortic arch region and selective catheterization of the right common carotid artery followed by angiogram was performed. Then, under roadmap guidance, the guide catheter was advanced into the distal intracranial internal carotid artery. Next, zoom 55 and phenom 21 microcatheter and synchro 14 microwire were utilized and under roadmap guidance, microcatheter and zoom 55 were advanced into the distal intracranial ICA. Biplane DSA cerebral angiography of the right ICA injection was performed over the head. Then, under roadmap guidance, microwire and microcatheter were navigated into the M1 segment of the right MCA. The microwire was advanced into the M2-M3 segment past the occlusion at the distal M1 segment. Next, zoom 55 was advanced with its distal end at the site of the occlusion of the distal M1. The microcatheter and wire were removed and the zoom 55 was connected to the penumbra aspiration suction system. After 3 minutes of suction, the zoom 55 catheter was removed the walrus guide catheter was disconnected at the hub, 10 mL of blood was aspirated and then DSA cerebral angiogram was performed. Next, repeat DSA cerebral angiogram was performed after about 10 minutes to confirm the  continued patency of the right MCA and its branches. Subsequently, dynamic CT head was performed. Next, the femoral sheath was partially withdrawn with its distal end in the external iliac artery and right femoral angiogram was performed and the femoral access site was closed by using an 8 [place] Angio-Seal device. Patient was then extubated and was transferred to the ICU in stable condition. FINDINGS: Right common carotid angiogram demonstrated common, internal and external carotid arteries to be widely patent without significant stenosis. Mild atheromatous disease. Biplane DSA cerebral angiography demonstrated distal intracranial internal carotid artery to be widely patent. Anterior cerebral artery and its branches have a normal appearance. There is occlusion at the distal M1 immediately distal to the origin of the anterior temporal artery. Post aspiration thrombectomy cerebral angiogram demonstrated complete revascularization with TICI 3 results. The right middle cerebral artery and its branches have a normal appearance. The venous phase of the angiogram is unremarkable with patent superior sagittal, inferior sagittal, transverse,  sigmoid sinuses. No aneurysm, av malformation or dural fistula seen. Dyna CT of the head demonstrated no intracranial bleed. IMPRESSION: Successful mechanical thrombectomy for distal right M1 occlusion of the MCA by direct aspiration first pass technique with TICI 3 results. PLAN: Patient will be admitted in the ICU for close monitoring. Systolic blood pressure goal to be at 120-140 mm Hg. Electronically Signed   By: Frazier Richards M.D.   On: 01/21/2022 15:23   IR US Guide Vasc Access Right  Result Date: 01/21/2022 INDICATION: Patient with MCA syndrome, distal M1 occlusion, left hemiplegia EXAM: 1. EMERGENT LARGE VESSEL OCCLUSION THROMBOLYSIS (ANTERIOR CIRCULATION) 2. MECHANICAL THROMBECTOMY OF THE DISTAL M1 OCCLUSION BY THE ADAPT (A DIRECT ASPIRATION FIRST PASS TECHNIQUE) TECHNIQUE  COMPARISON:  Correlation is made with a CTA of the head and neck performed on the same day earlier MEDICATIONS: Injection Ancef 2 g was administered within 1 hour of the procedure ANESTHESIA/SEDATION: General anesthesia CONTRAST:  60 mL of Omnipaque 300 FLUOROSCOPY TIME:  Fluoroscopy Time: 10 minutes 42 seconds (581 mGy). COMPLICATIONS: None immediate. TECHNIQUE: As the patient's family was not available and patient was not in a position to understand and consent for the procedure, procedure was performed under an emergent consent. The patient was then put under general anesthesia by the Department of Anesthesiology at [facility]. The right groin was prepped and draped in the usual sterile fashion. Thereafter using modified Seldinger technique, transfemoral access into the right common femoral artery was obtained without difficulty. Over a 0.035 inch guidewire a 8 French x 25 cm pinnacle was inserted. Through this, and also over a 0.035 inch glidewire a a walrus balloon guide catheter with a 6 French neuron select catheter was advanced to the aortic arch region and selective catheterization of the right common carotid artery followed by angiogram was performed. Then, under roadmap guidance, the guide catheter was advanced into the distal intracranial internal carotid artery. Next, zoom 55 and phenom 21 microcatheter and synchro 14 microwire were utilized and under roadmap guidance, microcatheter and zoom 55 were advanced into the distal intracranial ICA. Biplane DSA cerebral angiography of the right ICA injection was performed over the head. Then, under roadmap guidance, microwire and microcatheter were navigated into the M1 segment of the right MCA. The microwire was advanced into the M2-M3 segment past the occlusion at the distal M1 segment. Next, zoom 55 was advanced with its distal end at the site of the occlusion of the distal M1. The microcatheter and wire were removed and the zoom 55 was connected to  the penumbra aspiration suction system. After 3 minutes of suction, the zoom 55 catheter was removed the walrus guide catheter was disconnected at the hub, 10 mL of blood was aspirated and then DSA cerebral angiogram was performed. Next, repeat DSA cerebral angiogram was performed after about 10 minutes to confirm the continued patency of the right MCA and its branches. Subsequently, dynamic CT head was performed. Next, the femoral sheath was partially withdrawn with its distal end in the external iliac artery and right femoral angiogram was performed and the femoral access site was closed by using an 8 [place] Angio-Seal device. Patient was then extubated and was transferred to the ICU in stable condition. FINDINGS: Right common carotid angiogram demonstrated common, internal and external carotid arteries to be widely patent without significant stenosis. Mild atheromatous disease. Biplane DSA cerebral angiography demonstrated distal intracranial internal carotid artery to be widely patent. Anterior cerebral artery and its branches have a normal appearance.  There is occlusion at the distal M1 immediately distal to the origin of the anterior temporal artery. Post aspiration thrombectomy cerebral angiogram demonstrated complete revascularization with TICI 3 results. The right middle cerebral artery and its branches have a normal appearance. The venous phase of the angiogram is unremarkable with patent superior sagittal, inferior sagittal, transverse, sigmoid sinuses. No aneurysm, av malformation or dural fistula seen. Dyna CT of the head demonstrated no intracranial bleed. IMPRESSION: Successful mechanical thrombectomy for distal right M1 occlusion of the MCA by direct aspiration first pass technique with TICI 3 results. PLAN: Patient will be admitted in the ICU for close monitoring. Systolic blood pressure goal to be at 120-140 mm Hg. Electronically Signed   By: Frazier Richards M.D.   On: 01/21/2022 15:23   CT ANGIO  HEAD NECK W WO CM (CODE STROKE)  Result Date: 01/21/2022 CLINICAL DATA:  Acute stroke suspected EXAM: CT ANGIOGRAPHY HEAD AND NECK TECHNIQUE: Multidetector CT imaging of the head and neck was performed using the standard protocol during bolus administration of intravenous contrast. Multiplanar CT image reconstructions and MIPs were obtained to evaluate the vascular anatomy. Carotid stenosis measurements (when applicable) are obtained utilizing NASCET criteria, using the distal internal carotid diameter as the denominator. RADIATION DOSE REDUCTION: This exam was performed according to the departmental dose-optimization program which includes automated exposure control, adjustment of the mA and/or kV according to patient size and/or use of iterative reconstruction technique. CONTRAST:  29mL OMNIPAQUE IOHEXOL 350 MG/ML SOLN COMPARISON:  None Available. FINDINGS: CTA NECK FINDINGS Aortic arch: Atheromatous plaque.  Three vessel branching. Right carotid system: Diffuse atheromatous plaque, primarily calcified. No flow limiting stenosis or ulceration of the common and internal carotid arteries. ECA origin stenosis. Left carotid system: Calcified atheromatous plaque without stenosis or ulceration. Vertebral arteries: No proximal subclavian stenosis. Calcified plaque at the right vertebral origin. No flow limiting stenosis, beading, or dissection. Skeleton: Renal osteodystrophy findings at the medial clavicular heads. Other neck: 13 mm left thyroid nodule. No followup recommended (ref: J Am Coll Radiol. 2015 Feb;12(2): 143-50). Upper chest: At least moderate volume layering right pleural effusion. Review of the MIP images confirms the above findings CTA HEAD FINDINGS Anterior circulation: Abrupt cut off of the right MCA just beyond the anterior temporal branch. Partial downstream reconstitution. No left-sided or anterior cerebral embolic disease is seen. Confluent calcified plaque on the carotid siphons Posterior  circulation: The vertebral and basilar arteries are smoothly contoured and diffusely patent. No branch occlusion, beading, or aneurysm. Venous sinuses: Unremarkable for the arterial phase Anatomic variants: None significant Review of the MIP images confirms the above findings Critical Value/emergent results were called by telephone at the time of interpretation on 01/21/2022 at 12:05 pm to provider [facility] , who verbally acknowledged these results. IMPRESSION: 1. Emergent large vessel occlusion at the right M1 segment, just beyond the anterior temporal branch. 2. Atherosclerosis without flow limiting stenosis or embolic source seen in the more proximal neck. 3. Layering right pleural effusion. Electronically Signed   By: Jorje Guild M.D.   On: 01/21/2022 12:10   CT HEAD CODE STROKE WO CONTRAST  Result Date: 01/21/2022 CLINICAL DATA:  Code stroke.  Left-sided weakness EXAM: CT HEAD WITHOUT CONTRAST TECHNIQUE: Contiguous axial images were obtained from the base of the skull through the vertex without intravenous contrast. RADIATION DOSE REDUCTION: This exam was performed according to the departmental dose-optimization program which includes automated exposure control, adjustment of the mA and/or kV according to patient size and/or use of  iterative reconstruction technique. COMPARISON:  None Available. FINDINGS: Brain: No hemorrhage or convincing acute infarction. Remote left occipital parietal cortex infarct. No hydrocephalus or masslike finding Vascular: Only seen on reformats (due to slice spacing) there is a dense appearance at the distal right MCA Skull: Heterogeneous calvarial density diffusely, likely renal osteodystrophy. Sinuses/Orbits: No acute finding Other: These results were communicated to Dr. Leonel Ramsay at 11:55 am on 01/21/2022 by text page via the [facility] messaging system. ASPECTS [facility] Stroke Program Early CT Score) -when compared to gray-white differentiation on the left -  Ganglionic level infarction (caudate, lentiform nuclei, internal capsule, insula, M1-M3 cortex): 7 - Supraganglionic infarction (M4-M6 cortex): 3 Total score (0-10 with 10 being normal): 10 IMPRESSION: 1. Possible dense right MCA. 2. No intracranial hemorrhage.  ASPECTS is 10. 3. Remote left occipital parietal cortex infarction. Electronically Signed   By: Jorje Guild M.D.   On: 01/21/2022 11:57      HISTORY OF PRESENT ILLNESS Cindy Osborne is a 56 y.o. female with history of T2DM, bilateral BKA and ESRD presenting with acute onset left sided weakness and sensory deficit.  TNK was given in the ED, and patient underwent thrombectomy.  This was successful with TICI 3 flow achieved.    HOSPITAL COURSE She was admitted on June 10th, 2023 for acute onset of difficulty walking while at dialysis center. She was normal upon entering the dialysis unit. She does have bilateral BKA's, lives alone and takes care of all her own normal ADLs.  Code stroke was called by EMS at HD center.    Acute right MCA ischemic infarct likely secondary due to right M1 occlusion s/p TNK and mechanical thrombectomy with TICI 3 revascularization Ct head revealed with dense right MCA. CTA head/neck revealed a right M1 occlusion. She received tenecteplase as well as underwent a mechanical thrombectomy with TICI 3 results at the first pass. She required mechanical ventilation for the procedure and was extubated successfully. Mri brain revealed Small areas of acute infarction in the right MCA distribution, primarily cortical and limited in extent when compared to the level of prior occlusion. MRA head Normalized appearance of the right MCA bifurcation after embolectomy. No evidence of interval embolic disease. Atheromatous irregularity of the carotid siphons. Cardiac echo revealed EF 55-60%. LDL-c 79, HGBA1c 6.0. She was on ASA 81 mg daily prior to admission now is on 81mg  ASA and Plavix 75mg  daily for 3 weeks and then ASA  alone.  Hypertension  Home meds have been restarted amlodipine 10 mg daily and metoprolol XR 200mg  daily.  Hyperlipidemia  Her pravastatin has been increased to 40mg  daily from 20mg  daily. LDL-c was 79, goal of <70.    Controlled Type 2 DM Continue with home insulin regimen   ESRD Continue with iHD outpatient. Continue fluid restriction of 1200 cc daily   Obesity  Weight loss and exercise recommended as appropriate to decrease stroke risk   DISCHARGE EXAM Blood pressure 126/70, pulse 75, temperature 99.3 F (37.4 C), temperature source Oral, resp. rate 20, weight 105.6 kg, SpO2 94 %.  PHYSICAL EXAM General:  Alert, obese middle-aged African-American lady with bilateral BKAs in no acute distress  Sitting up in chair. She ready to go home.   Respiratory:  Regular, unlabored respirations on room air   NEURO:  Mental Status: AA&Ox3  Speech/Language: speech is without dysarthria or aphasia.  Fluency, and comprehension intact.   Cranial Nerves:  II: PERRL. Visual fields full.  III, IV, VI: EOMI. Eyelids elevate symmetrically.  V: Sensation is intact to light touch and symmetrical to face.  VII: Smile is symmetrical. Able to puff cheeks and raise eyebrows.  VIII: hearing intact to voice. IX, X: Phonation is normal.  XII: tongue is midline without fasciculations. Motor: 5/5 strength in all ext. B/L BKA . Diminished fine finger movements on the left.  Orbits right over left upper extremity. Tone: is normal and bulk is normal Sensation- Intact to light touch bilaterally.  Coordination: FTN intact bilaterally, HKS: no ataxia in BLE.No drift. Fine motor movements slowed on right with left hand orbiting right Gait- deferred Discharge Diet       Diet   Diet Carb Modified Fluid consistency: Thin; Room service appropriate? Yes; Fluid restriction: 1200 mL Fluid   liquids  DISCHARGE PLAN Disposition:  home  aspirin 81 mg daily and clopidogrel 75 mg daily for secondary stroke  prevention for 3  then aspirin alone. Ongoing stroke risk factor control by Primary Care Physician at time of discharge Follow-up PCP Glean Hess, MD in 2 weeks. Follow-up in [facility] in 4 weeks, office to schedule an appointment.   25 minutes were spent preparing discharge.  Beulah Gandy DNP, ACNPC-AG

## 2022-01-24 NOTE — Care Management Important Message (Signed)
Important Message  Patient Details  Name: Cindy Osborne MRN: [id number] Date of Birth: 08/09/1966   Medicare Important Message Given:        Orbie Pyo 01/24/2022, 3:01 PM

## 2022-01-24 NOTE — TOC CAGE-AID Note (Signed)
Transition of Care [facility]) - CAGE-AID Screening   Patient Details  Name: Cindy Osborne MRN: [id number] Date of Birth: 1965/09/14  Transition of Care [facility]) CM/SW Contact:    Gaetano Hawthorne Tarpley-Carter, [place] Phone Number: 01/24/2022, 3:10 PM   Clinical Narrative: Pt participated in [place].  Pt stated she does not use substance or ETOH.  Pt was not offered resources, due to no usage of substance or ETOH.    Shakevia Sarris Tarpley-Carter, MSW, LCSW-A Pronouns:  She/Her/Hers [facility] HealthTransitions of Care Clinical Social Worker Direct Number:  [phone] [email or web address]  CAGE-AID Screening:    Have You Ever Felt You Ought to Cut Down on Your Drinking or Drug Use?: No Have People Annoyed You By [employer] Your Drinking Or Drug Use?: No Have You Felt Bad Or Guilty About Your Drinking Or Drug Use?: No Have You Ever Had a Drink or Used Drugs First Thing In The Morning to Steady Your Nerves or to Get Rid of a Hangover?: No CAGE-AID Score: 0  Substance Abuse Education Offered: No

## 2022-01-24 NOTE — Evaluation (Signed)
Speech Language Pathology Evaluation Patient Details Name: Cindy Osborne MRN: [id number] DOB: 26-Jul-1966 Today's Date: 01/24/2022 Time: 1017-5102 SLP Time Calculation (min) (ACUTE ONLY): 16 min  Problem List:  Patient Active Problem List   Diagnosis Date Noted   Hyperlipidemia 01/24/2022   Obesity 01/24/2022   Arterial ischemic stroke, MCA (middle cerebral artery), right, acute ([place]) 01/21/2022   CKD (chronic kidney disease), stage V ([place]) 01/21/2022   Hypertension 01/21/2022   Combined forms of age-related cataract of both eyes 06/30/2021   Diabetic macular edema ([place]) 06/30/2021   Proteinuria 06/13/2021   Benign hypertensive kidney disease with chronic kidney disease 58/52/7782   Metabolic acidosis 42/35/3614   Encounter for insertion of mirena IUD 03/15/2021   Panic disorder 03/03/2021   Acquired absence of left leg below knee ([place]) 10/26/2020   PVD (peripheral vascular disease) ([place]) 07/28/2020   Uncontrolled type 2 diabetes mellitus with diabetic peripheral angiopathy without gangrene 02/26/2020   Acquired absence of right leg below knee ([place]) 07/28/2019   Diabetic retinopathy ([place]) 11/23/2016   Slow transit constipation 09/24/2015   Environmental and seasonal allergies 07/09/2015   Anemia associated with chronic renal failure 07/09/2015   CKD stage 5 due to type 2 diabetes mellitus ([place]) 07/09/2015   Type II diabetes mellitus with complication ([place]) 43/15/4008   Herpes simplex infection 07/09/2015   Hyperlipidemia associated with type 2 diabetes mellitus ([place]) 07/09/2015   Heart & renal disease, hypertensive, with heart failure ([place]) 07/09/2015   Arthritis of knee, degenerative 07/09/2015   Tendinitis 07/09/2015   Avitaminosis D 07/09/2015   Obstructive apnea 12/15/2014   Intracervical pessary 01/07/2014   Essential hypertension 09/11/2013   Type 2 diabetes mellitus with diabetic nephropathy ([place]) 09/11/2013   Past Medical History:  Past Medical History:   Diagnosis Date   Amputated toe ([place]) 01/31/2012   Right second toe distal phalanx Right great toe   Diabetes mellitus without complication ([place])    History of diabetic ulcer of foot 04/01/2014   Hyperlipidemia    Hypertension    Past Surgical History:  Past Surgical History:  Procedure Laterality Date   angioplasty politeal Left 07/2020   avg for dialysis     CHOLECYSTECTOMY     COLONOSCOPY  08/15/2011   cleared for 10 yrs   IR CT HEAD LTD  01/21/2022   IR PERCUTANEOUS ART THROMBECTOMY/INFUSION INTRACRANIAL INC DIAG ANGIO  01/21/2022   IR US GUIDE VASC ACCESS RIGHT  01/21/2022   LEG AMPUTATION BELOW KNEE Right 04/2019   LEG AMPUTATION BELOW KNEE Left 09/08/2020   RADIOLOGY WITH ANESTHESIA N/A 01/21/2022   Procedure: IR WITH ANESTHESIA;  Surgeon: Radiologist, Medication, MD;  Location: [place];  Service: Radiology;  Laterality: N/A;   TOE AMPUTATION Right 10/15/2018   HPI:  56 yo female admited 6/10 L side weakness would to have R M1 occlusion / R MCA 6/10 revasularization thrombectomy  TICI 3 results PMH Bil BKA, DM, HLD, HTN,   Assessment / Plan / Recommendation Clinical Impression  Pt scored a 22/30 on the SLUMS, which is suggestive of mild cognitive impairment; however, she belives that this is her baseline. She primarily had difficulty with memory and selective attention to details, and she endorses mild problems with these skills before the stroke. Her sister joined via videochat and agreed that the pt was back to her baseline. They also share that she has good family support between her mother and her sister. Her speech is completely intelligible and she has no subjective complaints. SLP to sign  off acutely.    SLP Assessment  SLP Recommendation/Assessment: Patient does not need any further Speech [place] [facility] SLP Visit Diagnosis: Dysarthria and anarthria (R47.1)    Recommendations for follow up therapy are one component of a multi-disciplinary discharge planning  process, led by the attending physician.  Recommendations may be updated based on patient status, additional functional criteria and insurance authorization.    Follow Up Recommendations  No SLP follow up    Assistance Recommended at Discharge  Intermittent Supervision/Assistance  Functional Status Assessment Patient has not had a recent decline in their functional status  Frequency and Duration           SLP Evaluation Cognition  Overall Cognitive Status: History of cognitive impairments - at baseline       Comprehension  Auditory Comprehension Overall Auditory Comprehension: Appears within functional limits for tasks assessed    Expression Expression Primary Mode of Expression: Verbal Verbal Expression Overall Verbal Expression: Appears within functional limits for tasks assessed   Oral / Motor  Motor Speech Overall Motor Speech: Appears within functional limits for tasks assessed            Osie Bond., M.A. [place] Office [phone]  Secure chat preferred  01/24/2022, 1:02 PM

## 2022-01-24 NOTE — Progress Notes (Addendum)
Pt received HD late last evening/early this morning. Pt is for d/c today. Contacted nephrologist to inquire if pt would be appropriate to receive out-pt HD at clinic tomorrow to avoid HD on day of d/c and since pt had HD late yesterday/early am today. Nephrologist agreeable to out-pt HD tomorrow at clinic. Contacted [place] and spoke to [employer], Therapist, sports. Pt can receive treatment tomorrow at clinic. Pt will need to arrive at 5:00 am for 5:20 am chair time. Spoke to pt via phone. Pt aware and agreeable to appts tomorrow at clinic. Appt added to pt's AVS as well. Contacted inpt HD unit to make them aware that pt will receive out-pt HD tomorrow. Update provided to CSW as well. Will fax d/c summary to clinic for continuation of care once completed.   Melven Sartorius Renal Navigator [phone]  Addendum at 1:45 pm: D/C summary faxed to [facility] (fax# [phone]).

## 2022-01-25 ENCOUNTER — Telehealth: Payer: Self-pay

## 2022-01-25 LAB — HEPATITIS B SURFACE ANTIBODY, QUANTITATIVE: Hep B S AB Quant (Post): 813.9 m[IU]/mL (ref >9.9)

## 2022-01-25 NOTE — Telephone Encounter (Signed)
Transition Care Management Follow-up Telephone Call Date of discharge and from where: 01/24/22 [facility] How have you been since you were released from the hospital? Pt states she is doing fine Any questions or concerns? No  Items Reviewed: Did the pt receive and understand the discharge instructions provided? Yes  Medications obtained and verified? Yes  Other? No  Any new allergies since your discharge? No  Dietary orders reviewed? Yes Do you have support at home? Yes   Home Care and Equipment/Supplies: Were home health services ordered? no  Were any new equipment or medical supplies ordered?  No  Functional Questionnaire: (I = Independent and D = Dependent) ADLs: I  Bathing/Dressing- I  Meal Prep- I  Eating- I  Maintaining continence- I  Transferring/Ambulation- I  Managing Meds- I  Follow up appointments reviewed:  PCP Hospital f/u appt confirmed? Yes  Scheduled to see Dr. [name] on 02/06/22 @ 2:00. [facility] f/u appt confirmed? Yes  Scheduled to see [facility] on 01/25/22. Are transportation arrangements needed? No  If their condition worsens, is the pt aware to call PCP or go to the Emergency Dept.? Yes Was the patient provided with contact information for the PCP's office or ED? Yes Was to pt encouraged to call back with questions or concerns? Yes

## 2022-02-06 ENCOUNTER — Encounter: Payer: Self-pay | Admitting: Internal Medicine

## 2022-02-06 ENCOUNTER — Ambulatory Visit (INDEPENDENT_AMBULATORY_CARE_PROVIDER_SITE_OTHER): Payer: Medicare Other | Admitting: Internal Medicine

## 2022-02-06 VITALS — BP 110/64 | HR 82 | Ht 69.0 in | Wt 235.0 lb

## 2022-02-06 DIAGNOSIS — N186 End stage renal disease: Secondary | ICD-10-CM

## 2022-02-06 DIAGNOSIS — I63511 Cerebral infarction due to unspecified occlusion or stenosis of right middle cerebral artery: Secondary | ICD-10-CM

## 2022-02-06 DIAGNOSIS — Z992 Dependence on renal dialysis: Secondary | ICD-10-CM

## 2022-02-06 DIAGNOSIS — I10 Essential (primary) hypertension: Secondary | ICD-10-CM

## 2022-02-06 DIAGNOSIS — E118 Type 2 diabetes mellitus with unspecified complications: Secondary | ICD-10-CM

## 2022-02-16 ENCOUNTER — Other Ambulatory Visit: Payer: Self-pay | Admitting: Internal Medicine

## 2022-02-16 NOTE — Telephone Encounter (Signed)
Requested medication (s) are due for refill today: no  Requested medication (s) are on the active medication list: yes  Last refill:  01/24/22  Future visit scheduled: yes  Notes to clinic:  Unable to refill per protocol, Rx expired. Medication was discontinued 01/24/22.     Requested Prescriptions  Pending Prescriptions Disp Refills   ASPIRIN LOW DOSE 81 MG tablet [Pharmacy Med Name: ASPIRIN EC 81 MG TABLET] 30 tablet 1    Sig: TAKE 1 TABLET BY MOUTH EVERY DAY     Analgesics:  NSAIDS - aspirin Failed - 02/16/2022  8:35 AM      Failed - Cr in normal range and within 360 days    Creatinine, Ser  Date Value Ref Range Status  01/23/2022 11.33 (H) 0.44 - 1.00 mg/dL Final         Failed - eGFR is 10 or above and within 360 days    GFR calc Af Amer  Date Value Ref Range Status  02/02/2021 4  Final   GFR, Estimated  Date Value Ref Range Status  01/23/2022 4 (L) >60 mL/min Final    Comment:    (NOTE) Calculated using the CKD-EPI Creatinine Equation (2021)          Passed - Patient is not pregnant      Passed - Valid encounter within last 12 months    Recent Outpatient Visits           1 week ago Acute right arterial ischemic stroke, MCA (middle cerebral artery) [facility])   [facility] Glean Hess, MD   5 months ago Acute non-recurrent maxillary sinusitis   [facility] Glean Hess, MD   8 months ago Mood disorder [facility])   [facility] Glean Hess, MD   11 months ago Annual physical exam   [facility] Glean Hess, MD   1 year ago Environmental and seasonal allergies   [facility] Glean Hess, MD       Future Appointments             In 1 month Penumalli, Earlean Polka, MD [facility]

## 2022-02-21 ENCOUNTER — Other Ambulatory Visit: Payer: Self-pay | Admitting: Neurology

## 2022-02-21 DIAGNOSIS — I639 Cerebral infarction, unspecified: Secondary | ICD-10-CM

## 2022-02-22 ENCOUNTER — Other Ambulatory Visit: Payer: Self-pay | Admitting: Internal Medicine

## 2022-02-22 MED ORDER — ASPIRIN 81 MG PO CHEW: 81.0000 mg | CHEWABLE_TABLET | Freq: Every day | ORAL | 0 refills | Status: DC

## 2022-02-22 NOTE — Telephone Encounter (Signed)
Pt stated she would like to request a 90 day supply of the following:   Medication Refill - Medication: aspirin 81 MG chewable tablet  Has the patient contacted their pharmacy? Yes. Pt told to contact provider   Preferred Pharmacy (with phone number or street name):  [id number] - [place], [place] Phone:  [phone]  Fax:  [phone]     Has the patient been seen for an appointment in the last year OR does the patient have an upcoming appointment? Yes.    Agent: Please be advised that RX refills may take up to 3 business days. We ask that you follow-up with your pharmacy.

## 2022-02-22 NOTE — Telephone Encounter (Signed)
Requested Prescriptions  Pending Prescriptions Disp Refills  . aspirin 81 MG chewable tablet 90 tablet 0    Sig: Chew 1 tablet (81 mg total) by mouth daily.     Analgesics:  NSAIDS - aspirin Failed - 02/22/2022  5:49 PM      Failed - Cr in normal range and within 360 days    Creatinine, Ser  Date Value Ref Range Status  01/23/2022 11.33 (H) 0.44 - 1.00 mg/dL Final         Failed - eGFR is 10 or above and within 360 days    GFR calc Af Amer  Date Value Ref Range Status  02/02/2021 4  Final   GFR, Estimated  Date Value Ref Range Status  01/23/2022 4 (L) >60 mL/min Final    Comment:    (NOTE) Calculated using the CKD-EPI Creatinine Equation (2021)          Passed - Patient is not pregnant      Passed - Valid encounter within last 12 months    Recent Outpatient Visits          2 weeks ago Acute right arterial ischemic stroke, MCA (middle cerebral artery) [facility])   [facility] Glean Hess, MD   5 months ago Acute non-recurrent maxillary sinusitis   [facility] Glean Hess, MD   8 months ago Mood disorder [facility])   [facility] Glean Hess, MD   11 months ago Annual physical exam   [facility] Glean Hess, MD   1 year ago Environmental and seasonal allergies   [facility] Glean Hess, MD      Future Appointments            In 3 weeks Penumalli, Earlean Polka, MD [facility]

## 2022-03-03 ENCOUNTER — Other Ambulatory Visit: Payer: Self-pay

## 2022-03-03 NOTE — Patient Outreach (Signed)
[facility]) Care Management  03/03/2022  Cindy Osborne 02/22/1966 [id number]   Telephone call to patient about questions with her stroke.  She states she was on Plavix but it ran out and she is taking aspirin.  Patient states she asked her PCP and was advised to take aspirin alone.  CM reviewed discharge papers and notes states to take plavix for 3 weeks and then aspirin alone. Reassured patient that she was doing what was right and that her doctor had advised her as suggested.  She voices no other concerns and no needs for ongoing support.  RN CM will close case.   Jone Baseman, RN, MSN [facility] Care Management Care Management Coordinator Direct Line [phone] Toll Free: [phone]  Fax: [phone]

## 2022-03-07 ENCOUNTER — Encounter: Payer: Federal, State, Local not specified - PPO | Admitting: Internal Medicine

## 2022-03-07 ENCOUNTER — Telehealth: Payer: Self-pay | Admitting: Internal Medicine

## 2022-03-07 ENCOUNTER — Other Ambulatory Visit: Payer: Self-pay

## 2022-03-07 MED ORDER — PRAVASTATIN SODIUM 40 MG PO TABS: 40.0000 mg | ORAL_TABLET | Freq: Every day | ORAL | 0 refills | Status: DC

## 2022-03-07 NOTE — Telephone Encounter (Signed)
Copied from [place] [phone]. Topic: General - Other >> Mar 07, 2022 10:43 AM Everette C wrote: Reason for CRM: The patient shares that they have seen their endocrinologist on 03/01/22  Please contact further if needed

## 2022-03-07 NOTE — Telephone Encounter (Signed)
Noted  

## 2022-03-08 ENCOUNTER — Ambulatory Visit: Payer: Self-pay

## 2022-03-08 NOTE — Telephone Encounter (Signed)
[facility] called and spoke with Octavia Bruckner, [facility] who just needed clarification on allergy. Advised that allergy to atorvastatin and rosuvastatin were 2 listed on chart but pt had been on pravastatin for several months with no issues. He states no further assistance was needed.   Summary: Pharmacy requesting Rx Clarification/confirm allergy   [facility] pharmacist stated she received an Rx for pravastatin (PRAVACHOL) 40 MG tablet; however, she has that pt is allergic to statin medications.   Britney requested clarification and would like to confirm allergy.      Reason for Disposition . Pharmacy calling with prescription question and triager answers question  Answer Assessment - Initial Assessment Questions 1. NAME of MEDICINE: "What medicine(s) are you calling about?"     pravastatin 2. QUESTION: "What is your question?" (e.g., double dose of medicine, side effect)     Pt had listed allergy to statin medications 3. PRESCRIBER: "Who prescribed the medicine?" Reason: if prescribed by specialist, call should be referred to that group.     Dr. [name]  Protocols used: Medication Question Call-A-AH

## 2022-03-20 ENCOUNTER — Ambulatory Visit (INDEPENDENT_AMBULATORY_CARE_PROVIDER_SITE_OTHER): Payer: Medicare Other | Admitting: Diagnostic Neuroimaging

## 2022-03-20 VITALS — BP 125/63 | HR 74 | Ht 69.0 in | Wt 236.1 lb

## 2022-03-20 DIAGNOSIS — I639 Cerebral infarction, unspecified: Secondary | ICD-10-CM | POA: Diagnosis not present

## 2022-03-20 DIAGNOSIS — I63411 Cerebral infarction due to embolism of right middle cerebral artery: Secondary | ICD-10-CM | POA: Diagnosis not present

## 2022-03-20 NOTE — Progress Notes (Unsigned)
[facility]  PATIENT: Cindy Osborne DOB: 1966-08-06  REFERRING CLINICIAN: Glean Hess, MD HISTORY FROM: patient  REASON FOR VISIT: new consult    HISTORICAL  CHIEF COMPLAINT:  Chief Complaint  Patient presents with   Follow-up    Pt is well. She has questions on continuing on the blood thinner or increasing aspirin. Room 6 alone    HISTORY OF PRESENT ILLNESS:   56 year old female here for evaluation of stroke.  Patient was at dialysis center and had some difficulty walking was taken to the hospital for evaluation of stroke.  She was found to have right MCA infarct with M1 occlusion, received tenecteplase and mechanical thrombectomy.  Stroke work-up was completed.  Patient has been doing well.  Tolerating medications.    REVIEW OF SYSTEMS: Full 14 system review of systems performed and negative with exception of: as per HPI.  ALLERGIES: Allergies  Allergen Reactions   Augmentin [Amoxicillin-Pot Clavulanate] Shortness Of Breath   Atorvastatin Other (See Comments)    Back pain   Pioglitazone Other (See Comments)    Edema   Peanut-Containing Drug Products Itching   Rosuvastatin Itching and Other (See Comments)    Back pain   Shellfish-Derived Products Itching and Rash    shrimp   Sulfa Antibiotics Rash and Other (See Comments)    Shedding of skin    HOME MEDICATIONS: Outpatient Medications Prior to Visit  Medication Sig Dispense Refill   amitriptyline (ELAVIL) 25 MG tablet Take 25 mg by mouth at bedtime.     amLODipine (NORVASC) 10 MG tablet Take 10 mg by mouth daily.     aspirin 81 MG chewable tablet Chew 1 tablet (81 mg total) by mouth daily. 90 tablet 0   fexofenadine (ALLEGRA) 180 MG tablet Take 180 mg by mouth daily.     Insulin Glargine (BASAGLAR KWIKPEN [place]) Inject 10-12 Units into the skin daily.     metoprolol (TOPROL-XL) 200 MG 24 hr tablet Take 1 tablet (200 mg total) by mouth daily. Take with or immediately following a meal. 30  tablet 0   montelukast (SINGULAIR) 10 MG tablet Take 1 tablet (10 mg total) by mouth at bedtime. (Patient taking differently: Take 10 mg by mouth as needed.) 30 tablet 0   pravastatin (PRAVACHOL) 40 MG tablet Take 1 tablet (40 mg total) by mouth daily at 6 PM. 90 tablet 0   sucroferric oxyhydroxide (VELPHORO) 500 MG chewable tablet Chew 500 mg by mouth 3 (three) times daily with meals.     clopidogrel (PLAVIX) 75 MG tablet Take 1 tablet (75 mg total) by mouth daily. 21 tablet 0   No facility-administered medications prior to visit.    PAST MEDICAL HISTORY: Past Medical History:  Diagnosis Date   Amputated toe ([place]) 01/31/2012   Right second toe distal phalanx Right great toe   Diabetes mellitus without complication ([place])    History of diabetic ulcer of foot 04/01/2014   Hyperlipidemia    Hypertension    Stroke ([place]) 01/21/2022    PAST SURGICAL HISTORY: Past Surgical History:  Procedure Laterality Date   angioplasty politeal Left 07/2020   avg for dialysis     CHOLECYSTECTOMY     COLONOSCOPY  08/15/2011   cleared for 10 yrs   IR CT HEAD LTD  01/21/2022   IR PERCUTANEOUS ART THROMBECTOMY/INFUSION INTRACRANIAL INC DIAG ANGIO  01/21/2022   IR US GUIDE VASC ACCESS RIGHT  01/21/2022   LEG AMPUTATION BELOW KNEE Right 04/2019   LEG AMPUTATION  BELOW KNEE Left 09/08/2020   RADIOLOGY WITH ANESTHESIA N/A 01/21/2022   Procedure: IR WITH ANESTHESIA;  Surgeon: Radiologist, Medication, MD;  Location: [place];  Service: Radiology;  Laterality: N/A;   TOE AMPUTATION Right 10/15/2018    FAMILY HISTORY: Family History  Problem Relation Age of Onset   Diabetes Mother    Diabetes Sister     SOCIAL HISTORY: Social History   Socioeconomic History   Marital status: Married    Spouse name: Not on file   Number of children: Not on file   Years of education: Not on file   Highest education level: Not on file  Occupational History   Not on file  Tobacco Use   Smoking status: Never   Smokeless  tobacco: Never  Vaping Use   Vaping Use: Never used  Substance and Sexual Activity   Alcohol use: Yes   Drug use: Never   Sexual activity: Yes    Partners: Male  Other Topics Concern   Not on file  Social History Narrative   Not on file   Social Determinants of Health   Financial Resource Strain: Low Risk  (02/06/2022)   Overall Financial Resource Strain (CARDIA)    Difficulty of Paying Living Expenses: Not hard at all  Food Insecurity: No Food Insecurity (02/06/2022)   Hunger Vital Sign    Worried About Running Out of Food in the Last Year: Never true    [place] in the Last Year: Never true  Transportation Needs: No Transportation Needs (02/06/2022)   PRAPARE - Hydrologist (Medical): No    Lack of Transportation (Non-Medical): No  Physical Activity: Not on file  Stress: Not on file  Social Connections: Not on file  Intimate Partner Violence: Not At Risk (02/06/2022)   Humiliation, Afraid, Rape, and Kick questionnaire    Fear of Current or Ex-Partner: No    Emotionally Abused: No    Physically Abused: No    Sexually Abused: No     PHYSICAL EXAM  GENERAL EXAM/CONSTITUTIONAL: Vitals:  Vitals:   03/20/22 1115  BP: 125/63  Pulse: 74  Weight: 236 lb 1 oz (107.1 kg)  Height: 5\' 9"  (1.753 m)   Body mass index is 34.86 kg/m. Wt Readings from Last 3 Encounters:  03/20/22 236 lb 1 oz (107.1 kg)  02/06/22 235 lb (106.6 kg)  01/24/22 232 lb 12.9 oz (105.6 kg)   Patient is in no distress; well developed, nourished and groomed; neck is supple  CARDIOVASCULAR: Examination of carotid arteries is normal; no carotid bruits Regular rate and rhythm, no murmurs Examination of peripheral vascular system by observation and palpation is normal  EYES: Ophthalmoscopic exam of optic discs and posterior segments is normal; no papilledema or hemorrhages No results found.  MUSCULOSKELETAL: Gait, strength, tone, movements noted in Neurologic exam  below  NEUROLOGIC: MENTAL STATUS:      No data to display         awake, alert, oriented to person, place and time recent and remote memory intact normal attention and concentration language fluent, comprehension intact, naming intact fund of knowledge appropriate  CRANIAL NERVE:  2nd - no papilledema on fundoscopic exam 2nd, 3rd, 4th, 6th - pupils equal and reactive to light, visual fields full to confrontation, extraocular muscles intact, no nystagmus 5th - facial sensation symmetric 7th - facial strength symmetric; SLIGHTLY DECR LEFT NL FOLD 8th - hearing intact 9th - palate elevates symmetrically, uvula midline 11th - shoulder  shrug symmetric 12th - tongue protrusion midline  MOTOR:  normal bulk and tone, full strength in the BUE; BILATERAL BELOW KNEE AMPUTATIONS  SENSORY:  normal and symmetric to light touch, temperature, vibration  COORDINATION:  finger-nose-finger, fine finger movements normal  REFLEXES:  deep tendon reflexes TRACE IN BUE  GAIT/STATION:  narrow based gait; USING CANE     DIAGNOSTIC DATA (LABS, IMAGING, TESTING) - I reviewed patient records, labs, notes, testing and imaging myself where available.  Lab Results  Component Value Date   WBC 8.3 01/23/2022   HGB 10.0 (L) 01/23/2022   HCT 32.7 (L) 01/23/2022   MCV 92.6 01/23/2022   PLT 176 01/23/2022      Component Value Date/Time   NA 135 01/23/2022 0526   NA 136 (A) 02/02/2021 0000   K 4.6 01/23/2022 0526   CL 94 (L) 01/23/2022 0526   CO2 25 01/23/2022 0526   GLUCOSE 85 01/23/2022 0526   BUN 71 (H) 01/23/2022 0526   BUN 112 (A) 02/02/2021 0000   CREATININE 11.33 (H) 01/23/2022 0526   CALCIUM 9.8 01/23/2022 0526   PROT 7.2 01/21/2022 1147   PROT 7.1 06/30/2020 1510   ALBUMIN 3.4 (L) 01/23/2022 0526   ALBUMIN 4.2 06/30/2020 1510   AST 12 (L) 01/21/2022 1147   ALT 9 01/21/2022 1147   ALKPHOS 153 (H) 01/21/2022 1147   BILITOT 0.6 01/21/2022 1147   BILITOT 0.2 06/30/2020 1510    GFRNONAA 4 (L) 01/23/2022 0526   GFRAA 4 02/02/2021 0000   Lab Results  Component Value Date   CHOL 134 01/22/2022   HDL 36 (L) 01/22/2022   LDLCALC 79 01/22/2022   TRIG 93 01/22/2022   CHOLHDL 3.7 01/22/2022   Lab Results  Component Value Date   HGBA1C 6.0 (H) 01/22/2022   No results found for: "VITAMINB12" Lab Results  Component Value Date   TSH 3.120 03/03/2021       ASSESSMENT AND PLAN  56 y.o. year old female here with:   Dx:  1. Cerebrovascular accident (CVA) due to embolism of right middle cerebral artery (HCC)       PLAN:   Acute right MCA ischemic infarct likely secondary due to right M1 occlusion s/p TNK and mechanical thrombectomy with TICI 3 revascularization (? Artery to artery embolism) Ct head revealed with dense right MCA. CTA head/neck revealed a right M1 occlusion. She received tenecteplase as well as underwent a mechanical thrombectomy with TICI 3 results at the first pass. She required mechanical ventilation for the procedure and was extubated successfully. Mri brain revealed Small areas of acute infarction in the right MCA distribution, primarily cortical and limited in extent when compared to the level of prior occlusion. MRA head Normalized appearance of the right MCA bifurcation after embolectomy. No evidence of interval embolic disease. Atheromatous irregularity of the carotid siphons. Cardiac echo revealed EF 55-60%. LDL-c 79, HGBA1c 6.0. She was on ASA 81 mg daily prior to admission now completed 81mg  ASA and Plavix 75mg  daily for 3 weeks - continue ASA 81mg  daily alone - may consider TEE, loop recorder to look for cardioembolic stroke   Hypertension  continue amlodipine 10 mg daily and metoprolol XR 200mg  daily.   Hyperlipidemia  Her pravastatin has been increased to 40mg  daily from 20mg  daily. LDL-c was 79, goal of <70.   Controlled Type 2 DM Continue with home insulin regimen      Return for pending if symptoms worsen or fail to  improve, return to PCP.  Penni Bombard, MD 04/17/5858, 29:24 PM Certified in Neurology, Neurophysiology and Neuroimaging  [facility] [address] [place], [place] [address] [phone]

## 2022-03-21 ENCOUNTER — Emergency Department (HOSPITAL_COMMUNITY): Payer: Medicare Other

## 2022-03-21 ENCOUNTER — Encounter: Payer: Self-pay | Admitting: Diagnostic Neuroimaging

## 2022-03-21 ENCOUNTER — Emergency Department (HOSPITAL_COMMUNITY): Payer: Medicare Other | Admitting: Certified Registered Nurse Anesthetist

## 2022-03-21 ENCOUNTER — Emergency Department (EMERGENCY_DEPARTMENT_HOSPITAL): Payer: Medicare Other | Admitting: Certified Registered Nurse Anesthetist

## 2022-03-21 ENCOUNTER — Encounter (HOSPITAL_COMMUNITY): Payer: Self-pay

## 2022-03-21 ENCOUNTER — Encounter (HOSPITAL_COMMUNITY)
Admission: EM | Disposition: A | Discharge status: Rehabilitation Facility | Payer: Self-pay | Source: Home / Self Care | Attending: Neurology

## 2022-03-21 ENCOUNTER — Other Ambulatory Visit: Payer: Self-pay

## 2022-03-21 ENCOUNTER — Inpatient Hospital Stay (HOSPITAL_COMMUNITY)
Admission: EM | Admit: 2022-03-21 | Discharge: 2022-03-25 | DRG: 023 | Disposition: A | Discharge status: Rehabilitation Facility | Payer: Medicare Other | Attending: Neurology | Admitting: Neurology

## 2022-03-21 ENCOUNTER — Inpatient Hospital Stay (HOSPITAL_COMMUNITY): Payer: Medicare Other

## 2022-03-21 DIAGNOSIS — R29810 Facial weakness: Secondary | ICD-10-CM | POA: Diagnosis present

## 2022-03-21 DIAGNOSIS — I63311 Cerebral infarction due to thrombosis of right middle cerebral artery: Secondary | ICD-10-CM | POA: Diagnosis not present

## 2022-03-21 DIAGNOSIS — J9601 Acute respiratory failure with hypoxia: Secondary | ICD-10-CM | POA: Diagnosis present

## 2022-03-21 DIAGNOSIS — I6601 Occlusion and stenosis of right middle cerebral artery: Secondary | ICD-10-CM | POA: Diagnosis not present

## 2022-03-21 DIAGNOSIS — Z7902 Long term (current) use of antithrombotics/antiplatelets: Secondary | ICD-10-CM

## 2022-03-21 DIAGNOSIS — E669 Obesity, unspecified: Secondary | ICD-10-CM | POA: Diagnosis present

## 2022-03-21 DIAGNOSIS — Z794 Long term (current) use of insulin: Secondary | ICD-10-CM

## 2022-03-21 DIAGNOSIS — E1165 Type 2 diabetes mellitus with hyperglycemia: Secondary | ICD-10-CM | POA: Diagnosis present

## 2022-03-21 DIAGNOSIS — Z89421 Acquired absence of other right toe(s): Secondary | ICD-10-CM | POA: Diagnosis not present

## 2022-03-21 DIAGNOSIS — I69312 Visuospatial deficit and spatial neglect following cerebral infarction: Secondary | ICD-10-CM | POA: Diagnosis not present

## 2022-03-21 DIAGNOSIS — Z20822 Contact with and (suspected) exposure to covid-19: Secondary | ICD-10-CM | POA: Diagnosis not present

## 2022-03-21 DIAGNOSIS — Z6833 Body mass index (BMI) 33.0-33.9, adult: Secondary | ICD-10-CM

## 2022-03-21 DIAGNOSIS — E1151 Type 2 diabetes mellitus with diabetic peripheral angiopathy without gangrene: Secondary | ICD-10-CM | POA: Diagnosis present

## 2022-03-21 DIAGNOSIS — E041 Nontoxic single thyroid nodule: Secondary | ICD-10-CM | POA: Diagnosis present

## 2022-03-21 DIAGNOSIS — I4892 Unspecified atrial flutter: Secondary | ICD-10-CM | POA: Diagnosis not present

## 2022-03-21 DIAGNOSIS — L89322 Pressure ulcer of left buttock, stage 2: Secondary | ICD-10-CM | POA: Diagnosis not present

## 2022-03-21 DIAGNOSIS — I639 Cerebral infarction, unspecified: Secondary | ICD-10-CM

## 2022-03-21 DIAGNOSIS — E1122 Type 2 diabetes mellitus with diabetic chronic kidney disease: Secondary | ICD-10-CM | POA: Diagnosis present

## 2022-03-21 DIAGNOSIS — Z8673 Personal history of transient ischemic attack (TIA), and cerebral infarction without residual deficits: Secondary | ICD-10-CM | POA: Diagnosis not present

## 2022-03-21 DIAGNOSIS — E1159 Type 2 diabetes mellitus with other circulatory complications: Secondary | ICD-10-CM | POA: Diagnosis not present

## 2022-03-21 DIAGNOSIS — M25512 Pain in left shoulder: Secondary | ICD-10-CM | POA: Diagnosis present

## 2022-03-21 DIAGNOSIS — R414 Neurologic neglect syndrome: Secondary | ICD-10-CM | POA: Diagnosis present

## 2022-03-21 DIAGNOSIS — F419 Anxiety disorder, unspecified: Secondary | ICD-10-CM | POA: Diagnosis not present

## 2022-03-21 DIAGNOSIS — I10 Essential (primary) hypertension: Secondary | ICD-10-CM | POA: Diagnosis not present

## 2022-03-21 DIAGNOSIS — G4730 Sleep apnea, unspecified: Secondary | ICD-10-CM

## 2022-03-21 DIAGNOSIS — I4891 Unspecified atrial fibrillation: Secondary | ICD-10-CM | POA: Diagnosis not present

## 2022-03-21 DIAGNOSIS — Z89512 Acquired absence of left leg below knee: Secondary | ICD-10-CM | POA: Diagnosis not present

## 2022-03-21 DIAGNOSIS — Z833 Family history of diabetes mellitus: Secondary | ICD-10-CM

## 2022-03-21 DIAGNOSIS — Z9181 History of falling: Secondary | ICD-10-CM

## 2022-03-21 DIAGNOSIS — Z7982 Long term (current) use of aspirin: Secondary | ICD-10-CM

## 2022-03-21 DIAGNOSIS — I480 Paroxysmal atrial fibrillation: Secondary | ICD-10-CM | POA: Diagnosis present

## 2022-03-21 DIAGNOSIS — I63411 Cerebral infarction due to embolism of right middle cerebral artery: Secondary | ICD-10-CM | POA: Diagnosis not present

## 2022-03-21 DIAGNOSIS — R4189 Other symptoms and signs involving cognitive functions and awareness: Secondary | ICD-10-CM | POA: Diagnosis present

## 2022-03-21 DIAGNOSIS — J449 Chronic obstructive pulmonary disease, unspecified: Secondary | ICD-10-CM | POA: Diagnosis present

## 2022-03-21 DIAGNOSIS — Z992 Dependence on renal dialysis: Secondary | ICD-10-CM

## 2022-03-21 DIAGNOSIS — D631 Anemia in chronic kidney disease: Secondary | ICD-10-CM | POA: Diagnosis not present

## 2022-03-21 DIAGNOSIS — G8194 Hemiplegia, unspecified affecting left nondominant side: Secondary | ICD-10-CM | POA: Diagnosis present

## 2022-03-21 DIAGNOSIS — L723 Sebaceous cyst: Secondary | ICD-10-CM | POA: Diagnosis present

## 2022-03-21 DIAGNOSIS — R471 Dysarthria and anarthria: Secondary | ICD-10-CM | POA: Diagnosis present

## 2022-03-21 DIAGNOSIS — E785 Hyperlipidemia, unspecified: Secondary | ICD-10-CM | POA: Diagnosis present

## 2022-03-21 DIAGNOSIS — I63511 Cerebral infarction due to unspecified occlusion or stenosis of right middle cerebral artery: Secondary | ICD-10-CM

## 2022-03-21 DIAGNOSIS — I629 Nontraumatic intracranial hemorrhage, unspecified: Secondary | ICD-10-CM | POA: Diagnosis not present

## 2022-03-21 DIAGNOSIS — E11649 Type 2 diabetes mellitus with hypoglycemia without coma: Secondary | ICD-10-CM | POA: Diagnosis not present

## 2022-03-21 DIAGNOSIS — Z880 Allergy status to penicillin: Secondary | ICD-10-CM

## 2022-03-21 DIAGNOSIS — M19012 Primary osteoarthritis, left shoulder: Secondary | ICD-10-CM | POA: Diagnosis present

## 2022-03-21 DIAGNOSIS — I120 Hypertensive chronic kidney disease with stage 5 chronic kidney disease or end stage renal disease: Secondary | ICD-10-CM | POA: Diagnosis present

## 2022-03-21 DIAGNOSIS — R1312 Dysphagia, oropharyngeal phase: Secondary | ICD-10-CM | POA: Diagnosis not present

## 2022-03-21 DIAGNOSIS — G2581 Restless legs syndrome: Secondary | ICD-10-CM | POA: Diagnosis present

## 2022-03-21 DIAGNOSIS — W19XXXD Unspecified fall, subsequent encounter: Secondary | ICD-10-CM | POA: Diagnosis present

## 2022-03-21 DIAGNOSIS — Z882 Allergy status to sulfonamides status: Secondary | ICD-10-CM

## 2022-03-21 DIAGNOSIS — N186 End stage renal disease: Secondary | ICD-10-CM | POA: Diagnosis present

## 2022-03-21 DIAGNOSIS — Z888 Allergy status to other drugs, medicaments and biological substances status: Secondary | ICD-10-CM

## 2022-03-21 DIAGNOSIS — I69328 Other speech and language deficits following cerebral infarction: Secondary | ICD-10-CM | POA: Diagnosis not present

## 2022-03-21 DIAGNOSIS — E1169 Type 2 diabetes mellitus with other specified complication: Secondary | ICD-10-CM | POA: Diagnosis not present

## 2022-03-21 DIAGNOSIS — Z79899 Other long term (current) drug therapy: Secondary | ICD-10-CM

## 2022-03-21 DIAGNOSIS — I340 Nonrheumatic mitral (valve) insufficiency: Secondary | ICD-10-CM | POA: Diagnosis present

## 2022-03-21 DIAGNOSIS — Z89511 Acquired absence of right leg below knee: Secondary | ICD-10-CM | POA: Diagnosis not present

## 2022-03-21 DIAGNOSIS — I69354 Hemiplegia and hemiparesis following cerebral infarction affecting left non-dominant side: Secondary | ICD-10-CM | POA: Diagnosis present

## 2022-03-21 DIAGNOSIS — M898X9 Other specified disorders of bone, unspecified site: Secondary | ICD-10-CM | POA: Diagnosis present

## 2022-03-21 DIAGNOSIS — M7062 Trochanteric bursitis, left hip: Secondary | ICD-10-CM | POA: Diagnosis present

## 2022-03-21 DIAGNOSIS — M25552 Pain in left hip: Secondary | ICD-10-CM | POA: Diagnosis present

## 2022-03-21 DIAGNOSIS — H53462 Homonymous bilateral field defects, left side: Secondary | ICD-10-CM | POA: Diagnosis not present

## 2022-03-21 DIAGNOSIS — E11319 Type 2 diabetes mellitus with unspecified diabetic retinopathy without macular edema: Secondary | ICD-10-CM | POA: Diagnosis present

## 2022-03-21 DIAGNOSIS — I69392 Facial weakness following cerebral infarction: Secondary | ICD-10-CM | POA: Diagnosis not present

## 2022-03-21 DIAGNOSIS — R29712 NIHSS score 12: Secondary | ICD-10-CM | POA: Diagnosis present

## 2022-03-21 HISTORY — PX: IR CT HEAD LTD: IMG2386

## 2022-03-21 HISTORY — DX: Cerebral infarction, unspecified: I63.9

## 2022-03-21 HISTORY — PX: IR PERCUTANEOUS ART THROMBECTOMY/INFUSION INTRACRANIAL INC DIAG ANGIO: IMG6087

## 2022-03-21 HISTORY — PX: IR US GUIDE VASC ACCESS RIGHT: IMG2390

## 2022-03-21 HISTORY — PX: RADIOLOGY WITH ANESTHESIA: SHX6223

## 2022-03-21 LAB — GLUCOSE, CAPILLARY
Glucose-Capillary: 221 mg/dL — ABNORMAL HIGH (ref 70–99)
Glucose-Capillary: 170 mg/dL — ABNORMAL HIGH (ref 70–99)
Glucose-Capillary: 101 mg/dL — ABNORMAL HIGH (ref 70–99)

## 2022-03-21 LAB — DIFFERENTIAL
Abs Immature Granulocytes: 0.06 10*3/uL (ref 0.00–0.07)
Basophils Absolute: 0 10*3/uL (ref 0.0–0.1)
Basophils Relative: 0 %
Eosinophils Absolute: 0 10*3/uL (ref 0.0–0.5)
Eosinophils Relative: 0 %
Immature Granulocytes: 0 %
Lymphocytes Relative: 9 %
Lymphs Abs: 1.3 10*3/uL (ref 0.7–4.0)
Monocytes Absolute: 0.7 10*3/uL (ref 0.1–1.0)
Monocytes Relative: 5 %
Neutro Abs: 11.8 10*3/uL — ABNORMAL HIGH (ref 1.7–7.7)
Neutrophils Relative %: 86 %

## 2022-03-21 LAB — CBC
HCT: 39.6 % (ref 36.0–46.0)
Hemoglobin: 12.8 g/dL (ref 12.0–15.0)
MCH: 28 pg (ref 26.0–34.0)
MCHC: 32.3 g/dL (ref 30.0–36.0)
MCV: 86.7 fL (ref 80.0–100.0)
Platelets: 227 10*3/uL (ref 150–400)
RBC: 4.57 MIL/uL (ref 3.87–5.11)
RDW: 17.4 % — ABNORMAL HIGH (ref 11.5–15.5)
WBC: 13.8 10*3/uL — ABNORMAL HIGH (ref 4.0–10.5)
nRBC: 0 % (ref 0.0–0.2)

## 2022-03-21 LAB — I-STAT CHEM 8, ED
BUN: 62 mg/dL — ABNORMAL HIGH (ref 6–20)
Calcium, Ion: 1.07 mmol/L — ABNORMAL LOW (ref 1.15–1.40)
Chloride: 103 mmol/L (ref 98–111)
Creatinine, Ser: 11.3 mg/dL — ABNORMAL HIGH (ref 0.44–1.00)
Glucose, Bld: 187 mg/dL — ABNORMAL HIGH (ref 70–99)
HCT: 41 % (ref 36.0–46.0)
Hemoglobin: 13.9 g/dL (ref 12.0–15.0)
Potassium: 4.8 mmol/L (ref 3.5–5.1)
Sodium: 138 mmol/L (ref 135–145)
TCO2: 23 mmol/L (ref 22–32)

## 2022-03-21 LAB — COMPREHENSIVE METABOLIC PANEL
ALT: 18 U/L (ref 0–44)
AST: 44 U/L — ABNORMAL HIGH (ref 15–41)
Albumin: 3.7 g/dL (ref 3.5–5.0)
Alkaline Phosphatase: 151 U/L — ABNORMAL HIGH (ref 38–126)
Anion gap: 18 — ABNORMAL HIGH (ref 5–15)
BUN: 71 mg/dL — ABNORMAL HIGH (ref 6–20)
CO2: 22 mmol/L (ref 22–32)
Calcium: 10 mg/dL (ref 8.9–10.3)
Chloride: 99 mmol/L (ref 98–111)
Creatinine, Ser: 10.25 mg/dL — ABNORMAL HIGH (ref 0.44–1.00)
GFR, Estimated: 4 mL/min — ABNORMAL LOW (ref >60)
Glucose, Bld: 193 mg/dL — ABNORMAL HIGH (ref 70–99)
Potassium: 5.1 mmol/L (ref 3.5–5.1)
Sodium: 139 mmol/L (ref 135–145)
Total Bilirubin: 0.9 mg/dL (ref 0.3–1.2)
Total Protein: 8.4 g/dL — ABNORMAL HIGH (ref 6.5–8.1)

## 2022-03-21 LAB — HEPATITIS C ANTIBODY: HCV Ab: NONREACTIVE

## 2022-03-21 LAB — SARS CORONAVIRUS 2 BY RT PCR: SARS Coronavirus 2 by RT PCR: NEGATIVE

## 2022-03-21 LAB — I-STAT BETA HCG BLOOD, ED (MC, WL, AP ONLY): I-stat hCG, quantitative: 11.9 m[IU]/mL — ABNORMAL HIGH (ref <5)

## 2022-03-21 LAB — MRSA NEXT GEN BY PCR, NASAL: MRSA by PCR Next Gen: NOT DETECTED

## 2022-03-21 LAB — ETHANOL: Alcohol, Ethyl (B): <10 mg/dL (ref <10)

## 2022-03-21 LAB — PHOSPHORUS: Phosphorus: 6 mg/dL — ABNORMAL HIGH (ref 2.5–4.6)

## 2022-03-21 LAB — PROTIME-INR
INR: 1.3 — ABNORMAL HIGH (ref 0.8–1.2)
Prothrombin Time: 16.2 seconds — ABNORMAL HIGH (ref 11.4–15.2)

## 2022-03-21 LAB — HEPATITIS B SURFACE ANTIGEN: Hepatitis B Surface Ag: NONREACTIVE

## 2022-03-21 LAB — APTT: aPTT: 29 seconds (ref 24–36)

## 2022-03-21 LAB — HEPATITIS B SURFACE ANTIBODY,QUALITATIVE: Hep B S Ab: REACTIVE — AB

## 2022-03-21 LAB — CBG MONITORING, ED: Glucose-Capillary: 201 mg/dL — ABNORMAL HIGH (ref 70–99)

## 2022-03-21 SURGERY — IR WITH ANESTHESIA
Anesthesia: General

## 2022-03-21 MED ORDER — SUGAMMADEX SODIUM 200 MG/2ML IV SOLN
INTRAVENOUS | Status: DC | PRN
  Administered 2022-03-21: 300 mg via INTRAVENOUS
  Administered 2022-03-21: 100 mg via INTRAVENOUS

## 2022-03-21 MED ORDER — FENTANYL CITRATE (PF) 100 MCG/2ML IJ SOLN
INTRAMUSCULAR | Status: DC | PRN
  Administered 2022-03-21: 100 ug via INTRAVENOUS

## 2022-03-21 MED ORDER — IOHEXOL 300 MG/ML  SOLN: 100.0000 mL | Freq: Once | INTRAMUSCULAR | Status: DC | PRN

## 2022-03-21 MED ORDER — SODIUM CHLORIDE 0.9 % IV SOLN
INTRAVENOUS | Status: DC
Start: 2022-03-21 — End: 2022-03-22

## 2022-03-21 MED ORDER — PHENYLEPHRINE HCL-NACL 20-0.9 MG/250ML-% IV SOLN
INTRAVENOUS | Status: DC | PRN
  Administered 2022-03-21: 25 ug/min via INTRAVENOUS

## 2022-03-21 MED ORDER — ACETAMINOPHEN 325 MG PO TABS
650.0000 mg | ORAL_TABLET | ORAL | Status: DC | PRN
  Administered 2022-03-24: 650 mg via ORAL
  Filled 2022-03-21: qty 2

## 2022-03-21 MED ORDER — METOPROLOL SUCCINATE ER 50 MG PO TB24: 200.0000 mg | ORAL_TABLET | Freq: Every day | ORAL | Status: DC

## 2022-03-21 MED ORDER — SODIUM CHLORIDE 0.9 % IV SOLN: INTRAVENOUS | Status: DC | PRN

## 2022-03-21 MED ORDER — FENTANYL CITRATE (PF) 100 MCG/2ML IJ SOLN
INTRAMUSCULAR | Status: AC
  Filled 2022-03-21: qty 2

## 2022-03-21 MED ORDER — ONDANSETRON HCL 4 MG/2ML IJ SOLN
INTRAMUSCULAR | Status: DC | PRN
  Administered 2022-03-21: 4 mg via INTRAVENOUS

## 2022-03-21 MED ORDER — IOHEXOL 350 MG/ML SOLN
100.0000 mL | Freq: Once | INTRAVENOUS | Status: AC | PRN
  Administered 2022-03-21: 100 mL via INTRAVENOUS

## 2022-03-21 MED ORDER — CHLORHEXIDINE GLUCONATE CLOTH 2 % EX PADS
6.0000 | MEDICATED_PAD | Freq: Every day | CUTANEOUS | Status: DC
  Administered 2022-03-22: 6 via TOPICAL

## 2022-03-21 MED ORDER — CLEVIDIPINE BUTYRATE 0.5 MG/ML IV EMUL
INTRAVENOUS | Status: AC
  Filled 2022-03-21: qty 50

## 2022-03-21 MED ORDER — ACETAMINOPHEN 650 MG RE SUPP
650.0000 mg | RECTAL | Status: DC | PRN
  Administered 2022-03-22: 650 mg via RECTAL
  Filled 2022-03-21: qty 1

## 2022-03-21 MED ORDER — ACETAMINOPHEN 160 MG/5ML PO SOLN: 650.0000 mg | ORAL | Status: DC | PRN

## 2022-03-21 MED ORDER — CLEVIDIPINE BUTYRATE 0.5 MG/ML IV EMUL
0.0000 mg/h | INTRAVENOUS | Status: DC
  Administered 2022-03-21: 3 mg/h via INTRAVENOUS

## 2022-03-21 MED ORDER — IOHEXOL 300 MG/ML  SOLN
100.0000 mL | Freq: Once | INTRAMUSCULAR | Status: AC | PRN
  Administered 2022-03-21: 50 mL via INTRA_ARTERIAL

## 2022-03-21 MED ORDER — SENNOSIDES-DOCUSATE SODIUM 8.6-50 MG PO TABS: 1.0000 | ORAL_TABLET | Freq: Every evening | ORAL | Status: DC | PRN

## 2022-03-21 MED ORDER — ROCURONIUM BROMIDE 10 MG/ML (PF) SYRINGE
PREFILLED_SYRINGE | INTRAVENOUS | Status: DC | PRN
  Administered 2022-03-21: 60 mg via INTRAVENOUS

## 2022-03-21 MED ORDER — AMLODIPINE BESYLATE 10 MG PO TABS: 10.0000 mg | ORAL_TABLET | Freq: Every day | ORAL | Status: DC

## 2022-03-21 MED ORDER — CLEVIDIPINE BUTYRATE 0.5 MG/ML IV EMUL
INTRAVENOUS | Status: AC
  Administered 2022-03-21: 2 mg/h via INTRAVENOUS
  Filled 2022-03-21: qty 50

## 2022-03-21 MED ORDER — PROPOFOL 10 MG/ML IV BOLUS
INTRAVENOUS | Status: DC | PRN
  Administered 2022-03-21: 80 mg via INTRAVENOUS

## 2022-03-21 MED ORDER — STROKE: EARLY STAGES OF RECOVERY BOOK
Freq: Once | Status: AC
  Filled 2022-03-21: qty 1

## 2022-03-21 MED ORDER — SODIUM CHLORIDE 0.9% FLUSH
3.0000 mL | Freq: Once | INTRAVENOUS | Status: AC
  Administered 2022-03-21: 3 mL via INTRAVENOUS

## 2022-03-21 MED ORDER — PHENYLEPHRINE 80 MCG/ML (10ML) SYRINGE FOR IV PUSH (FOR BLOOD PRESSURE SUPPORT)
PREFILLED_SYRINGE | INTRAVENOUS | Status: DC | PRN
  Administered 2022-03-21 (×3): 80 ug via INTRAVENOUS

## 2022-03-21 MED ORDER — LIDOCAINE 2% (20 MG/ML) 5 ML SYRINGE
INTRAMUSCULAR | Status: DC | PRN
  Administered 2022-03-21: 20 mg via INTRAVENOUS

## 2022-03-21 MED ORDER — INSULIN ASPART 100 UNIT/ML IJ SOLN
0.0000 [IU] | INTRAMUSCULAR | Status: DC
  Administered 2022-03-21: 4 [IU] via SUBCUTANEOUS
  Administered 2022-03-21: 7 [IU] via SUBCUTANEOUS
  Administered 2022-03-22 (×2): 4 [IU] via SUBCUTANEOUS
  Administered 2022-03-23: 15 [IU] via SUBCUTANEOUS
  Administered 2022-03-23: 3 [IU] via SUBCUTANEOUS
  Administered 2022-03-24: 4 [IU] via SUBCUTANEOUS
  Administered 2022-03-24 (×2): 3 [IU] via SUBCUTANEOUS

## 2022-03-21 NOTE — Transfer of Care (Signed)
Immediate Anesthesia Transfer of Care Note  Patient: Cindy Osborne  Procedure(s) Performed: IR WITH ANESTHESIA  Patient Location: ICU  Anesthesia Type:General  Level of Consciousness: awake and alert   Airway & Oxygen Therapy: Patient Spontanous Breathing and Patient connected to face mask oxygen  Post-op Assessment: Report given to RN and Post -op Vital signs reviewed and stable  Post vital signs: Reviewed and stable  Last Vitals:  Vitals Value Taken Time  BP 152/114 03/21/22 1515  Temp    Pulse 116 03/21/22 1516  Resp 30 03/21/22 1516  SpO2 98 % 03/21/22 1516  Vitals shown include unvalidated device data.  Last Pain:  Vitals:   03/21/22 1250  PainSc: 0-No pain         Complications: No notable events documented.

## 2022-03-21 NOTE — Anesthesia Procedure Notes (Signed)
Procedure Name: Intubation Date/Time: 03/21/2022 1:53 PM  Performed by: Carolan Clines, CRNAPre-anesthesia Checklist: Patient identified, Emergency Drugs available, Suction available and Patient being monitored Patient Re-evaluated:Patient Re-evaluated prior to induction Oxygen Delivery Method: Circle System Utilized Preoxygenation: Pre-oxygenation with 100% oxygen Induction Type: IV induction, Rapid sequence and Cricoid Pressure applied Laryngoscope Size: Mac and 3 Grade View: Grade I Tube type: Oral Tube size: 7.5 mm Number of attempts: 1 Airway Equipment and Method: Stylet Placement Confirmation: ETT inserted through vocal cords under direct vision, positive ETCO2 and breath sounds checked- equal and bilateral Secured at: 22 cm Tube secured with: Tape Dental Injury: Teeth and Oropharynx as per pre-operative assessment

## 2022-03-21 NOTE — Code Documentation (Addendum)
Stroke Response Nurse Documentation Code Documentation  Cindy Osborne is a 56 y.o. female arriving to [facility]  via [facility] on 03/21/22 with past medical hx of CVA, HTN, ESRD. On aspirin 81 mg daily. Code stroke was activated by EMS.   Patient from home where she was [place] on 03/20/22 at 1900. She reports that she was last known normal at 1900 last night then at [place] she attempted to ambulate (with prosthetics) to the shower and noticed she was weak on the left side. She reports holding on to railings in her house and getting herself back to bed. She stayed there until family visited today and noticed the changes and called EMS.  Stroke team at the bedside on patient arrival. Labs drawn and patient cleared for CT by Dr. Sherry Ruffing. Patient to CT with team. NIHSS 12, see documentation for details and code stroke times. Patient with right gaze preference , right facial droop, left arm weakness, left leg weakness, left decreased sensation, dysarthria , and Sensory  neglect on exam. The following imaging was completed:  CT Head, CTA, and CTP. Patient is not a candidate for IV Thrombolytic due to being out of the window. Patient is a candidate for IR and family consent was obtained. See Code IR documentation.   Delays during code stroke: Difficult IV access.   Care Plan: post IR NIHSS, VS, pulse/vascular checks per protocol.   Bedside handoff with IR RN.  Meda Klinefelter  Stroke Response RN

## 2022-03-21 NOTE — Consult Note (Addendum)
NEURO HOSPITALIST HISTORY AND PHYSICAL   Requestig physician: Dr. Sherry Ruffing  Reason for Consult: Acute onset of left facial droop, left sided weakness and left hemineglect  History obtained from:   EMS, Patient and Chart     HPI:                                                                                                                                          Cindy Osborne is an 56 y.o. female with a PMHx of DM, diabetic foot ulcer, bilateral BKA, ESRD on HD, HLD, HTN and right MCA stroke in June of this year who presents as a Code Stroke via EMS with acute onset of left facial droop, left sided weakness and left hemineglect. At her admission in June for acute stroke she had undergone right MCA thrombectomy with small residual foci of restricted diffusion on MRI following the procedure.   Per her discharge summary from 6/13: "She was admitted on June 10th, 2023 for acute onset of difficulty walking while at dialysis center. She was normal upon entering the dialysis unit. She does have bilateral BKA's, lives alone and takes care of all her own normal ADLs. Code stroke was called by EMS at HD center." "Ct head revealed with dense right MCA. CTA head/neck revealed a right M1 occlusion. She received tenecteplase as well as underwent a mechanical thrombectomy with TICI 3 results at the first pass. She required mechanical ventilation for the procedure and was extubated successfully. Mri brain revealed Small areas of acute infarction in the right MCA distribution, primarily cortical and limited in extent when compared to the level of prior occlusion. MRA head Normalized appearance of the right MCA bifurcation after embolectomy. No evidence of interval embolic disease. Atheromatous irregularity of the carotid siphons. Cardiac echo revealed EF 55-60%. LDL-c 79, HGBA1c 6.0. She was on ASA 81 mg daily prior to admission now is on 81mg  ASA and Plavix 75mg  daily for 3 weeks and then ASA  alone."  mRS: 2  Past Medical History:  Diagnosis Date   Amputated toe ([place]) 01/31/2012   Right second toe distal phalanx Right great toe   Diabetes mellitus without complication ([place])    History of diabetic ulcer of foot 04/01/2014   Hyperlipidemia    Hypertension    Stroke ([place]) 01/21/2022    Past Surgical History:  Procedure Laterality Date   angioplasty politeal Left 07/2020   avg for dialysis     CHOLECYSTECTOMY     COLONOSCOPY  08/15/2011   cleared for 10 yrs   IR CT HEAD LTD  01/21/2022   IR PERCUTANEOUS ART THROMBECTOMY/INFUSION INTRACRANIAL INC DIAG ANGIO  01/21/2022   IR US GUIDE VASC ACCESS RIGHT  01/21/2022   LEG AMPUTATION BELOW KNEE Right 04/2019   LEG  AMPUTATION BELOW KNEE Left 09/08/2020   RADIOLOGY WITH ANESTHESIA N/A 01/21/2022   Procedure: IR WITH ANESTHESIA;  Surgeon: Radiologist, Medication, MD;  Location: [facility];  Service: Radiology;  Laterality: N/A;   TOE AMPUTATION Right 10/15/2018    Family History  Problem Relation Age of Onset   Diabetes Mother    Diabetes Sister              Social History:  reports that she has never smoked. She has never used smokeless tobacco. She reports current alcohol use. She reports that she does not use drugs.  Allergies  Allergen Reactions   Augmentin [Amoxicillin-Pot Clavulanate] Shortness Of Breath   Atorvastatin Other (See Comments)    Back pain   Pioglitazone Other (See Comments)    Edema   Peanut-Containing Drug Products Itching   Rosuvastatin Itching and Other (See Comments)    Back pain   Shellfish-Derived Products Itching and Rash    shrimp   Sulfa Antibiotics Rash and Other (See Comments)    Shedding of skin    HOME MEDICATIONS:                                                                                                                      No current facility-administered medications on file prior to encounter.   Current Outpatient Medications on File Prior to Encounter  Medication Sig  Dispense Refill   amitriptyline (ELAVIL) 25 MG tablet Take 25 mg by mouth at bedtime.     amLODipine (NORVASC) 10 MG tablet Take 10 mg by mouth daily.     aspirin 81 MG chewable tablet Chew 1 tablet (81 mg total) by mouth daily. 90 tablet 0   fexofenadine (ALLEGRA) 180 MG tablet Take 180 mg by mouth daily.     Insulin Glargine (BASAGLAR KWIKPEN [place]) Inject 10-12 Units into the skin daily.     metoprolol (TOPROL-XL) 200 MG 24 hr tablet Take 1 tablet (200 mg total) by mouth daily. Take with or immediately following a meal. 30 tablet 0   montelukast (SINGULAIR) 10 MG tablet Take 1 tablet (10 mg total) by mouth at bedtime. (Patient taking differently: Take 10 mg by mouth as needed.) 30 tablet 0   pravastatin (PRAVACHOL) 40 MG tablet Take 1 tablet (40 mg total) by mouth daily at 6 PM. 90 tablet 0   sucroferric oxyhydroxide (VELPHORO) 500 MG chewable tablet Chew 500 mg by mouth 3 (three) times daily with meals.       ROS:  In the context of her left hemineglect and anosognosia she has no complaints except for an aching left buttock.    There were no vitals taken for this visit. There were no vitals taken for this visit.  General Examination:                                                                                                       Physical Exam  HEENT-  [place]/AT    Lungs- Respirations unlabored Extremities- Bilateral BKA   Neurological Examination Mental Status: Awake and alert with dense left hemineglect and anosognosia. Speech is fluent with intact comprehension and naming. Mild dysarthria is noted. Oriented to the city, state and year but not the month or day.  Cranial Nerves: II: Left homonymous hemianopsia. PERRL.  III,IV, VI: No ptosis. EOMI but with right gaze preference, difficulty gazing past midline to the left and inability to bury sclerae  when gazing to the left.  V: Decreased sensation on the left.  VII: Left facial droop.  VIII: Hearing intact to voice IX,X: No hypophonia or hoarseness XI: Weak shoulder shrug on the left.  XII: Midline tongue extension Motor: RUE and RLE 5/5 LUE 0/5 LLE 3/5 Sensory: Insensate to FT and pinch to LUE and LLE Deep Tendon Reflexes: 2+ and symmetric bilateral brachioradialis and patellae.  Cerebellar: FNF normal on the right. Unable to assess on the left.  Gait: Unable to assess  NIHSS: 12   Lab Results: Basic Metabolic Panel: No results for input(s): "NA", "K", "CL", "CO2", "GLUCOSE", "BUN", "CREATININE", "CALCIUM", "MG", "PHOS" in the last 168 hours.  CBC: No results for input(s): "WBC", "NEUTROABS", "HGB", "HCT", "MCV", "PLT" in the last 168 hours.  Cardiac Enzymes: No results for input(s): "CKTOTAL", "CKMB", "CKMBINDEX", "TROPONINI" in the last 168 hours.  Lipid Panel: No results for input(s): "CHOL", "TRIG", "HDL", "CHOLHDL", "VLDL", "LDLCALC" in the last 168 hours.  Imaging: No results found.  MRI 01/22/22:  Small areas of acute infarction in the right MCA distribution, primarily cortical and limited in extent when compared to the level of prior occlusion.   MRA 01/22/22:  1. Normalized appearance of the right MCA bifurcation after embolectomy. No evidence of interval embolic disease. 2. Atheromatous irregularity of the carotid siphons.  TTE 01/22/22: 1. Left ventricular ejection fraction, by estimation, is 55 to 60%. The  left ventricle has normal function. The left ventricle has no regional  wall motion abnormalities. The left ventricular internal cavity size was  mildly dilated. There is moderate concentric left ventricular hypertrophy. Left ventricular diastolic parameters are consistent with Grade II diastolic dysfunction  (pseudonormalization). Elevated left ventricular end-diastolic pressure.   2. Right ventricular systolic function is normal. The right  ventricular  size is mildly enlarged. Tricuspid regurgitation signal is inadequate for  assessing PA pressure.   3. Left atrial size was moderately dilated.   4. The mitral valve is abnormal. Mild to moderate mitral valve  regurgitation. No evidence of mitral stenosis. Moderate to severe mitral  annular calcification.   5. The aortic valve is tricuspid. There is moderate calcification  of the  aortic valve. There is mild thickening of the aortic valve. Aortic valve  regurgitation is not visualized. Mild aortic valve stenosis.   6. The inferior vena cava is normal in size with greater than 50%  respiratory variability, suggesting right atrial pressure of 3 mmHg.   Assessment: 56 year old female with stroke in June secondary to left M2 occlusion which was treated with thrombectomy at that time, re-presenting with acute onset of left hemiplegia, left facial droop, left sided sensory loss and left hemineglect.   - Exam findings best localize to the right MCA territory - CT head: Hypodensity involving the right frontal cortex concerning for acute infarct. Large area of hypodensity within the deep white matter of the right frontal lobe without corresponding cortical hypodensity. Question evolving infarct. ASPECTS is 9.  - CTA of head and neck: Acute right M2/MCA posterior division occlusion. 21 mL of ischemic penumbra on the right.  - Not a TNK candidate due to time criteria - The patient is a VIR candidate. Risks/benefits of the procedure were emergently discussed with the patient's daughter by telephone, including approximately 50% chance of significant improvement relative to an approximate 10% chance of subarachnoid hemorrhage with possibility of significant worsening including death. The patient's left hemineglect and anosognosia precludes meaningful medical decision making on her part at this time. The patient's daughter expressed understanding and provided informed consent to proceed with VIR. All  questions answered.     Recommendations: # Acute right MCA stroke - Admit to the Neuro ICU under the Neurology service - Post-thrombectomy order set to include frequent neuro checks and BP management - No antiplatelet medications or anticoagulants for at least 24 hours following VIR - DVT prophylaxis with SCDs - Continue pravastatin - Will need escalation of antiplatelet therapy to ASA plus Plavix indefinitely. Can only start after 24 hours and only if CT head is negative for hemorrhagic conversion - Cardiac telemetry - PT/OT/Speech - MRI brain, MRA head - NPO until passes swallow evaluation - Fasting lipid panel and HgbA1c  # ESRD on HD: - Obtaining Nephrology consult - Monitor renal function QD  # DM2 - Sliding scale insulin  # Scattered opacities in the bilateral lung apices on CTA, likely infectious in nature.  - Given nodular appearance, follow-up chest CT is recommended by Radiology. - Small thyroid nodule also noted on CTA  85 minutes spent in the emergent neurological evaluation and management of this critically ill patient. Time spent included discussion of her condition and treatment options with family, in addition to coordination of care with the ED and VIR teams.   Electronically signed: Dr. Kerney Elbe 03/21/2022, 1:03 PM    .

## 2022-03-21 NOTE — ED Provider Notes (Signed)
[place] EMERGENCY DEPARTMENT Provider Note   CSN: [id number] Arrival date & time: 03/21/22  1248     History  No chief complaint on file.   Cindy Osborne is a 56 y.o. female.  The history is provided by the patient, the EMS personnel and medical records. No language interpreter was used.  Neurologic Problem This is a new problem. The current episode started 12 to 24 hours ago. The problem occurs constantly. The problem has not changed since onset.Pertinent negatives include no chest pain, no abdominal pain, no headaches and no shortness of breath. Nothing aggravates the symptoms. Nothing relieves the symptoms. She has tried nothing for the symptoms. The treatment provided no relief.       Home Medications Prior to Admission medications   Medication Sig Start Date End Date Taking? Authorizing Provider  amitriptyline (ELAVIL) 25 MG tablet Take 25 mg by mouth at bedtime.    [provider]  amLODipine (NORVASC) 10 MG tablet Take 10 mg by mouth daily.    [provider]  aspirin 81 MG chewable tablet Chew 1 tablet (81 mg total) by mouth daily. 02/22/22   Glean Hess, MD  fexofenadine (ALLEGRA) 180 MG tablet Take 180 mg by mouth daily.    [provider]  Insulin Glargine (BASAGLAR KWIKPEN [place]) Inject 10-12 Units into the skin daily.    [provider]  metoprolol (TOPROL-XL) 200 MG 24 hr tablet Take 1 tablet (200 mg total) by mouth daily. Take with or immediately following a meal. 01/25/22   August Albino, NP  montelukast (SINGULAIR) 10 MG tablet Take 1 tablet (10 mg total) by mouth at bedtime. Patient taking differently: Take 10 mg by mouth as needed. 01/24/22   August Albino, NP  pravastatin (PRAVACHOL) 40 MG tablet Take 1 tablet (40 mg total) by mouth daily at 6 PM. 03/07/22   Glean Hess, MD  sucroferric oxyhydroxide (VELPHORO) 500 MG chewable tablet Chew 500 mg by mouth 3 (three) times daily with meals.     [provider]      Allergies    Augmentin [amoxicillin-pot clavulanate], Atorvastatin, Pioglitazone, Peanut-containing drug products, Rosuvastatin, Shellfish-derived products, and Sulfa antibiotics    Review of Systems   Review of Systems  Constitutional:  Negative for chills and fatigue.  HENT:  Negative for congestion.   Respiratory:  Negative for chest tightness and shortness of breath.   Cardiovascular:  Negative for chest pain.  Gastrointestinal:  Negative for abdominal pain, constipation, diarrhea, nausea and vomiting.  Genitourinary:  Negative for flank pain.  Musculoskeletal:  Negative for back pain.  Neurological:  Positive for weakness. Negative for speech difficulty, light-headedness and headaches.  All other systems reviewed and are negative.   Physical Exam Updated Vital Signs There were no vitals taken for this visit. Physical Exam Vitals and nursing note reviewed.  Constitutional:      General: She is not in acute distress.    Appearance: She is well-developed. She is ill-appearing. She is not toxic-appearing or diaphoretic.  HENT:     Head: Atraumatic.     Mouth/Throat:     Mouth: Mucous membranes are moist.  Eyes:     Conjunctiva/sclera: Conjunctivae normal.     Pupils: Pupils are equal, round, and reactive to light.  Cardiovascular:     Rate and Rhythm: Normal rate and regular rhythm.     Heart sounds: No murmur heard. Pulmonary:     Effort: Pulmonary effort is normal. No  respiratory distress.     Breath sounds: Normal breath sounds. No wheezing, rhonchi or rales.  Chest:     Chest wall: No tenderness.  Abdominal:     General: Abdomen is flat.     Palpations: Abdomen is soft.     Tenderness: There is no abdominal tenderness. There is no guarding or rebound.  Musculoskeletal:        General: No swelling or tenderness.     Cervical back: Neck supple.  Skin:    General: Skin is warm and dry.     Capillary Refill: Capillary refill takes less  than 2 seconds.  Neurological:     Mental Status: She is alert.     Cranial Nerves: Facial asymmetry present.     Motor: Weakness present.     Comments: Left-sided weakness including face and arm.  Right gaze preference.  Pupils symmetric and reactive.  Clear speech.  Did not get the chance to check sensation throughout or coordination.     ED Results / Procedures / Treatments   Labs (all labs ordered are listed, but only abnormal results are displayed) Labs Reviewed  PROTIME-INR - Abnormal; Notable for the following components:      Result Value   Prothrombin Time 16.2 (*)    INR 1.3 (*)    All other components within normal limits  CBC - Abnormal; Notable for the following components:   WBC 13.8 (*)    RDW 17.4 (*)    All other components within normal limits  DIFFERENTIAL - Abnormal; Notable for the following components:   Neutro Abs 11.8 (*)    All other components within normal limits  COMPREHENSIVE METABOLIC PANEL - Abnormal; Notable for the following components:   Glucose, Bld 193 (*)    BUN 71 (*)    Creatinine, Ser 10.25 (*)    Total Protein 8.4 (*)    AST 44 (*)    Alkaline Phosphatase 151 (*)    GFR, Estimated 4 (*)    Anion gap 18 (*)    All other components within normal limits  I-STAT CHEM 8, ED - Abnormal; Notable for the following components:   BUN 62 (*)    Creatinine, Ser 11.30 (*)    Glucose, Bld 187 (*)    Calcium, Ion 1.07 (*)    All other components within normal limits  CBG MONITORING, ED - Abnormal; Notable for the following components:   Glucose-Capillary 201 (*)    All other components within normal limits  I-STAT BETA HCG BLOOD, ED ([facility], [facility], AP ONLY) - Abnormal; Notable for the following components:   I-stat hCG, quantitative 11.9 (*)    All other components within normal limits  APTT  ETHANOL    EKG None  Radiology CT ANGIO HEAD NECK W WO CM W PERF (CODE STROKE)  Result Date: 03/21/2022 CLINICAL DATA:  Neuro deficit, acute, stroke  suspected. EXAM: CT ANGIOGRAPHY HEAD AND NECK CT PERFUSION BRAIN TECHNIQUE: Multidetector CT imaging of the head and neck was performed using the standard protocol during bolus administration of intravenous contrast. Multiplanar CT image reconstructions and MIPs were obtained to evaluate the vascular anatomy. Carotid stenosis measurements (when applicable) are obtained utilizing NASCET criteria, using the distal internal carotid diameter as the denominator. Multiphase CT imaging of the brain was performed following IV bolus contrast injection. Subsequent parametric perfusion maps were calculated using RAPID software. RADIATION DOSE REDUCTION: This exam was performed according to the departmental dose-optimization program which includes automated exposure control,  adjustment of the mA and/or kV according to patient size and/or use of iterative reconstruction technique. CONTRAST:  160mL OMNIPAQUE IOHEXOL 350 MG/ML SOLN COMPARISON:  CT angiogram January 21, 2022. FINDINGS: CTA NECK FINDINGS Aortic arch: Standard branching. Imaged portion shows no evidence of aneurysm or dissection. Atherosclerotic calcified plaques are seen in the aortic arch. No significant stenosis of the major arch vessel origins. Right carotid system: Atherosclerotic changes of the right carotid bifurcation without hemodynamically significant stenosis. Left carotid system: Mild atherosclerotic changes of the left carotid bifurcation without hemodynamically significant stenosis. Vertebral arteries: Atherosclerotic changes at the origin of the right vertebral artery resulting in severe stenosis. Skeleton: No acute or aggressive process identified. Other neck: 2.4 cm left thyroid lobe lobe nodule. Recommend nonemergent thyroid US. Reference: J Am Coll Radiol. 2015 Feb;12(2): 143-50. Upper chest: Bilateral pleural effusion. Scattered opacities in the bilateral lung apices, some of them appear nodular. Review of the MIP images confirms the above findings  CTA HEAD FINDINGS Anterior circulation: Short segment of occlusion of the proximal right M2/MCA posterior division branch with distal reconstitution by collaterals. Calcified plaques in the bilateral carotid siphons. Bilateral ACA is and left MCA have normal caliber. Posterior circulation: No significant stenosis, proximal occlusion, aneurysm, or vascular malformation. Venous sinuses: As permitted by contrast timing, patent. Anatomic variants: None significant. Review of the MIP images confirms the above findings CT Brain Perfusion Findings: ASPECTS: 9 CBF (<30%) Volume: 23mL Perfusion (Tmax>6.0s) volume: 68mL Mismatch Volume: 48mL Infarction Location:Right parietal lobe. IMPRESSION: 1. Acute right M2/MCA posterior division occlusion. 2. A 21 mL of ischemic penumbra right identified. 3. Scattered opacities in the bilateral lung apices, likely infectious in nature. However, given nodular appearance, follow-up chest CT is recommended. 4. A 2.4 cm thyroid lobe nodule. These results were called by telephone at the time of interpretation on 03/21/2022 at 1:36 pm to provider ERIC [facility] , who verbally acknowledged these results. Electronically Signed   By: Pedro Earls M.D.   On: 03/21/2022 13:48   CT HEAD CODE STROKE WO CONTRAST  Result Date: 03/21/2022 CLINICAL DATA:  Code stroke. Neuro deficit, acute, stroke suspected. EXAM: CT HEAD WITHOUT CONTRAST TECHNIQUE: Contiguous axial images were obtained from the base of the skull through the vertex without intravenous contrast. RADIATION DOSE REDUCTION: This exam was performed according to the departmental dose-optimization program which includes automated exposure control, adjustment of the mA and/or kV according to patient size and/or use of iterative reconstruction technique. COMPARISON:  MRI of the brain January 22, 2022. FINDINGS: Brain: Small hypodense area involving the cortex in the right frontal lobe may represent acute infarct. Hypodensity within the  deep white matter of the right frontal lobe without corresponding cortical involvement with no corresponding white matter disease seen on prior MRI. No evidence of acute hemorrhage, hydrocephalus, extra-axial collection or mass lesion/mass effect. Remote infarcts in the right insula, temporal and parietal lobes corresponding to infarcts demonstrated on prior MRI. Vascular: No hyperdense vessel or unexpected calcification. Skull: Normal. Negative for fracture or focal lesion. Sinuses/Orbits: No acute finding. Other: None. ASPECTS [facility] Stroke Program Early CT Score) - Ganglionic level infarction (caudate, lentiform nuclei, internal capsule, insula, M1-M3 cortex): 7 - Supraganglionic infarction (M4-M6 cortex): 1 Total score (0-10 with 10 being normal): 9 IMPRESSION: 1. Hypodensity involving the right frontal cortex concerning for acute infarct. 2. Large area of hypodensity within the deep white matter of the right frontal lobe without corresponding cortical hypodensity. No white matter disease seen on prior MRI, question evolving  infarct. 3. ASPECTS is 9. These results were called by telephone at the time of interpretation on 03/21/2022 at 1:19 pm to provider [facility] , who verbally acknowledged these results. Electronically Signed   By: Pedro Earls M.D.   On: 03/21/2022 13:20    Procedures Procedures    CRITICAL CARE Performed by: Gwenyth Allegra Joshaua Epple Total critical care time: 35 minutes Critical care time was exclusive of separately billable procedures and treating other patients. Critical care was necessary to treat or prevent imminent or life-threatening deterioration. Critical care was time spent personally by me on the following activities: development of treatment plan with patient and/or surrogate as well as nursing, discussions with consultants, evaluation of patient's response to treatment, examination of patient, obtaining history from patient or surrogate, ordering  and performing treatments and interventions, ordering and review of laboratory studies, ordering and review of radiographic studies, pulse oximetry and re-evaluation of patient's condition.   Medications Ordered in ED Medications  sodium chloride flush (NS) 0.9 % injection 3 mL (has no administration in time range)  iohexol (OMNIPAQUE) 300 MG/ML solution 100 mL (has no administration in time range)  clevidipine (CLEVIPREX) 0.5 MG/ML infusion (has no administration in time range)  iohexol (OMNIPAQUE) 350 MG/ML injection 100 mL (100 mLs Intravenous Contrast Given 03/21/22 1323)  fentaNYL (SUBLIMAZE) 100 MCG/2ML injection (  Override pull for Anesthesia 03/21/22 1400)    ED Course/ Medical Decision Making/ A&P                           Medical Decision Making Amount and/or Complexity of Data Reviewed Labs: ordered. Radiology: ordered.    Cindy Osborne is a 56 y.o. female with a past medical history significant for ESRD, hypertension, diabetes, bilateral below the knee amputations, and recent stroke status post thrombectomy 2 months ago who presents  as a code stroke for left-sided weakness.  According to EMS, her family saw her at 1 PM yesterday and she had no deficits however patient says that at 7 PM when she was getting ready to take a shower she was at her baseline.  She reports that by 730 she had weakness on her left face, left arm, and left leg and the symptoms persisted today.  She reports no headache and denies other fevers, chills, congestion, cough, nausea, vomiting, constipation, diarrhea, or other complaints.  I evaluated patient at the [facility] upon arrival with EMS and her airway appears intact.  She is answering questions appropriately.  She was stable for the CT scanner.  Patient likely taken to CT scanner.  On my exam, lungs were clear and chest was nontender.  Abdomen nontender.  She has weakness on her left side including left facial droop.  She also has a right gaze  preference and is leaning to the right.  She is denying headache.  Patient started workup including CT imaging.  Patient has evidence of acute stroke and was deemed a candidate for IR thrombectomy.  Patient will go to IR for further intervention by neurology and neurointerventional radiology.          Final Clinical Impression(s) / ED Diagnoses Final diagnoses:  Cerebrovascular accident (CVA), unspecified mechanism ([place])   Clinical Impression: 1. Cerebrovascular accident (CVA), unspecified mechanism ([place])     Disposition: Admit  This note was prepared with assistance of Dragon voice recognition software. Occasional wrong-word or sound-a-like substitutions may have occurred due to the inherent limitations of voice recognition software.  Cindy Osborne, Gwenyth Allegra, MD 03/21/22 [id number]

## 2022-03-21 NOTE — Consult Note (Signed)
Renal Service Consult Note [facility]  Shandora Koogler 03/21/2022 Sol Blazing, MD Requesting Physician: Dr. Cheral Marker  Reason for Consult: ESRD pt w/ acute CVA HPI: The patient is a 56 y.o. year-old female w/ hx of DM2, bilat BKA, esrd on HD, HTN and hx CVA in June 2023 who presented w/ L sided weakness started last night around 7 pm. In ED pt had L sided weakness and L facial droop. Pt was worked up for acute IR thrombectomy and underwent the procedure this afternoon per IR. Post procedure she was placed on bipap. CXR shows diffuse bilat edema. We are asked to see for dialysis.   Pt seen in ICU.  She is calm on bipap. Answers simple questions, groggy though. Last HD was Saturday. TTS HD schedule.   ROS - denies CP, no joint pain, no HA, no blurry vision, no rash, no diarrhea, no nausea/ vomiting, no dysuria, no difficulty voiding   Past Medical History  Past Medical History:  Diagnosis Date   Amputated toe ([place]) 01/31/2012   Right second toe distal phalanx Right great toe   Diabetes mellitus without complication ([place])    History of diabetic ulcer of foot 04/01/2014   Hyperlipidemia    Hypertension    Stroke ([place]) 01/21/2022   Past Surgical History  Past Surgical History:  Procedure Laterality Date   angioplasty politeal Left 07/2020   avg for dialysis     CHOLECYSTECTOMY     COLONOSCOPY  08/15/2011   cleared for 10 yrs   IR CT HEAD LTD  01/21/2022   IR CT HEAD LTD  03/21/2022   IR PERCUTANEOUS ART THROMBECTOMY/INFUSION INTRACRANIAL INC DIAG ANGIO  01/21/2022   IR PERCUTANEOUS ART THROMBECTOMY/INFUSION INTRACRANIAL INC DIAG ANGIO  03/21/2022   IR US GUIDE VASC ACCESS RIGHT  01/21/2022   LEG AMPUTATION BELOW KNEE Right 04/2019   LEG AMPUTATION BELOW KNEE Left 09/08/2020   RADIOLOGY WITH ANESTHESIA N/A 01/21/2022   Procedure: IR WITH ANESTHESIA;  Surgeon: Radiologist, Medication, MD;  Location: [facility];  Service: Radiology;  Laterality: N/A;   TOE AMPUTATION Right  10/15/2018   Family History  Family History  Problem Relation Age of Onset   Diabetes Mother    Diabetes Sister    Social History  reports that she has never smoked. She has never used smokeless tobacco. She reports current alcohol use. She reports that she does not use drugs. Allergies  Allergies  Allergen Reactions   Augmentin [Amoxicillin-Pot Clavulanate] Shortness Of Breath   Atorvastatin Other (See Comments)    Back pain   Pioglitazone Other (See Comments)    Edema   Peanut-Containing Drug Products Itching   Rosuvastatin Itching and Other (See Comments)    Back pain   Shellfish-Derived Products Itching and Rash    shrimp   Sulfa Antibiotics Rash and Other (See Comments)    Shedding of skin   Home medications Prior to Admission medications   Medication Sig Start Date End Date Taking? Authorizing Provider  amitriptyline (ELAVIL) 25 MG tablet Take 25 mg by mouth at bedtime.    [provider]  amLODipine (NORVASC) 10 MG tablet Take 10 mg by mouth daily.    [provider]  aspirin 81 MG chewable tablet Chew 1 tablet (81 mg total) by mouth daily. 02/22/22   Glean Hess, MD  fexofenadine (ALLEGRA) 180 MG tablet Take 180 mg by mouth daily.    [provider]  Insulin Glargine (BASAGLAR KWIKPEN [place]) Inject 10-12 Units  into the skin daily.    [provider]  metoprolol (TOPROL-XL) 200 MG 24 hr tablet Take 1 tablet (200 mg total) by mouth daily. Take with or immediately following a meal. 01/25/22   August Albino, NP  montelukast (SINGULAIR) 10 MG tablet Take 1 tablet (10 mg total) by mouth at bedtime. Patient taking differently: Take 10 mg by mouth as needed. 01/24/22   August Albino, NP  pravastatin (PRAVACHOL) 40 MG tablet Take 1 tablet (40 mg total) by mouth daily at 6 PM. 03/07/22   Glean Hess, MD  sucroferric oxyhydroxide (VELPHORO) 500 MG chewable tablet Chew 500 mg by mouth 3 (three) times daily with meals.    [provider]     Vitals:   03/21/22 1408 03/21/22 1507 03/21/22 1637  BP: 130/80 (!) 153/105   Pulse: 90 (!) 110 (!) 101  Resp: 18 (!) 27 (!) 25  Temp:  98 F (36.7 C)   TempSrc:  Axillary   SpO2: 100% 92% 90%   Exam Gen pt on bipap, will answer simple questions No rash, cyanosis or gangrene Sclera anicteric, throat clear  No jvd or bruits Chest clear bilat to bases, no rales/ wheezing RRR no MRG Abd soft ntnd no mass or ascites +bs obese GU defer MS bilat BKA's, no edema Ext no LE or UE no wounds or ulcers Neuro groggy but responds to questions, follows simple command, not moving L side much    LUA AVF + bruit      Home meds include - amitriptyline, amlodipine, aspirin, fexofenadine, insluin glargine, metoprolol xl 200, montelukast, pravastatin, sucroferric oxyhydroxide 1 ac tid, prns/ vits/ supps    OP HD: [place] ([facility]) TTS   4h  400/600  2/2.5 bath  Heparin 2000 - needs updating (June 2023)   Na 139  K 5.1 CO2 22 BUN 71  Cr 10.2  Ca 10.0  Alb 3.7 WBC 13 Hb 12.8   BP 130/80, MAP 88-95  HR 96  RR 27  temp 98   Assessment/Plan: Acute CVA - sp intervention today. Was here in June for similar event.  Acute resp failure w/ hypoxemia - on Bipap, CXR showing diffuse bilateral pulm edema. HD as below.  ESRD - on HD TTS. Due for HD today. Pt is in ICU stable on bipap. Plan HD in ICU tonight BP/ volume - BP's normal to high, no edema on exam. CXR wet as above Anemia of CKD - Hgb 12, no esa needs at this time. Get records.  5.   MBD ckd - get records. CCa a bit high. Will add on phos.  6.   Nutrition - Advance to renal diet once clinically stable        [name]  MD 03/21/2022, 5:14 PM Recent Labs  Lab 03/21/22 1320 03/21/22 1322  HGB 12.8 13.9  ALBUMIN 3.7  --   CALCIUM 10.0  --   CREATININE 10.25* 11.30*  K 5.1 4.8

## 2022-03-21 NOTE — Progress Notes (Signed)
Pt awake and alert breathing well on 4LNC. Sat 97% HR 92. PT receiving dialysis currently. No resp distress noted. Bipap on standby at bedside. Will continue to monitor.

## 2022-03-21 NOTE — Progress Notes (Signed)
Patient arrived with three patient belonging bags. Within she had a towel, a blanket, a fitted bed sheet, top bed sheet, pajama top, and a necklace. All at bedside currently

## 2022-03-21 NOTE — Anesthesia Postprocedure Evaluation (Signed)
Anesthesia Post Note  Patient: Cindy Osborne  Procedure(s) Performed: IR WITH ANESTHESIA     Patient location during evaluation: ICU Anesthesia Type: General Level of consciousness: sedated, oriented and patient cooperative Pain management: pain level controlled Vital Signs Assessment: post-procedure vital signs reviewed and stable Respiratory status: spontaneous breathing and patient connected to face mask oxygen (pt very tachypneic) Cardiovascular status: stable Postop Assessment: no apparent nausea or vomiting Anesthetic complications: no Comments: Stroke team to check CXR and Covid test, and will assess for hemodialysis   No notable events documented.  Last Vitals:  Vitals:   03/21/22 1408  BP: 130/80  Pulse: 90  Resp: 18  SpO2: 100%    Last Pain:  Vitals:   03/21/22 1250  PainSc: 0-No pain                 Reena Borromeo,E. Zafira Munos

## 2022-03-21 NOTE — Procedures (Signed)
INTERVENTIONAL NEURORADIOLOGY BRIEF POSTPROCEDURE NOTE   DIAGNOSTIC CEREBRAL ANGIOGRAM    Attending: Dr. Frazier Richards   Assistant: Dr. Ladean Raya   Diagnosis: Rt M1/M2 partial occlusion(clot in the 2 branches of the 3 vessels)   Access site: RCFA, 69F   Access closure: 8 Fr Angioseal    Anesthesia: GETA   Medication used: refer to anesthesia documentation.   Complications: none.   Estimated blood loss: 30 mL   Specimen: None.   Findings: partial occlusion of the 2 branches of the 3 vessels at MCA trifurcation. Mechanical thrombectomy performed with Stentriever x1 technique using 3 mm x 20 mm Solitaire X with complete recanalization of the MCA(TICI 3).   The patient tolerated the procedure well without incident and is transferred to ICU after extubation in a stable condition.    PLAN: - Bed rest x6 hours post femoral puncture - SBP 120-140 mmHg

## 2022-03-21 NOTE — Progress Notes (Signed)
Pt placed on BiPAP by RT. Pt tolerating well at this time, RN aware,RT will monitor.  

## 2022-03-21 NOTE — ED Triage Notes (Addendum)
Code IR    5051

## 2022-03-21 NOTE — Anesthesia Preprocedure Evaluation (Signed)
Anesthesia Evaluation  Patient identified by MRN, date of birth, ID band Patient awake    Reviewed: Allergy & Precautions, NPO status , Patient's Chart, lab work & pertinent test results  History of Anesthesia Complications Negative for: history of anesthetic complications  Airway Mallampati: I  TM Distance: >3 FB Neck ROM: Full    Dental  (+) Poor Dentition, Dental Advisory Given, Missing   Pulmonary sleep apnea , COPD,  COPD inhaler,    breath sounds clear to auscultation       Cardiovascular hypertension, Pt. on medications and Pt. on home beta blockers + Peripheral Vascular Disease   Rhythm:Regular Rate:Normal  6/;2023 ECHO: EF 55-60%, normal LVF with mild LV dilation, mod LVH, Grade 2 DD, normal RVF, mild-mod MR, mild AS with peak grad 15 mmHg   Neuro/Psych Anxiety CVA (acute CVA with R gaze, L facial droop)    GI/Hepatic negative GI ROS, Neg liver ROS,   Endo/Other  diabetes (glu 201), Insulin DependentMorbid obesity  Renal/GU ESRF and DialysisRenal disease (TuThSa, K+ 4.8)     Musculoskeletal  (+) Arthritis ,   Abdominal (+) + obese,   Peds  Hematology   Anesthesia Other Findings   Reproductive/Obstetrics                             Anesthesia Physical Anesthesia Plan  ASA: 4 and emergent  Anesthesia Plan: General   Post-op Pain Management: Minimal or no pain anticipated   Induction: Intravenous and Rapid sequence  PONV Risk Score and Plan: 3 and Ondansetron and Treatment may vary due to age or medical condition  Airway Management Planned: Oral ETT  Additional Equipment: None  Intra-op Plan:   Post-operative Plan: Possible Post-op intubation/ventilation  Informed Consent: I have reviewed the patients History and Physical, chart, labs and discussed the procedure including the risks, benefits and alternatives for the proposed anesthesia with the patient or authorized  representative who has indicated his/her understanding and acceptance.     Dental advisory given and Only emergency history available  Plan Discussed with: CRNA and Surgeon  Anesthesia Plan Comments:         Anesthesia Quick Evaluation

## 2022-03-21 NOTE — Sedation Documentation (Signed)
Right femoral artery sheath removed. 8Fr angioseal closure device deployed to site by Dr. Gerhard Perches.

## 2022-03-22 ENCOUNTER — Inpatient Hospital Stay (HOSPITAL_COMMUNITY): Payer: Medicare Other

## 2022-03-22 ENCOUNTER — Encounter (HOSPITAL_COMMUNITY): Payer: Self-pay | Admitting: Radiology

## 2022-03-22 DIAGNOSIS — Z8673 Personal history of transient ischemic attack (TIA), and cerebral infarction without residual deficits: Secondary | ICD-10-CM

## 2022-03-22 DIAGNOSIS — Z89512 Acquired absence of left leg below knee: Secondary | ICD-10-CM

## 2022-03-22 DIAGNOSIS — I639 Cerebral infarction, unspecified: Secondary | ICD-10-CM

## 2022-03-22 DIAGNOSIS — I63411 Cerebral infarction due to embolism of right middle cerebral artery: Secondary | ICD-10-CM | POA: Diagnosis not present

## 2022-03-22 DIAGNOSIS — N186 End stage renal disease: Secondary | ICD-10-CM

## 2022-03-22 DIAGNOSIS — Z89511 Acquired absence of right leg below knee: Secondary | ICD-10-CM

## 2022-03-22 DIAGNOSIS — E1159 Type 2 diabetes mellitus with other circulatory complications: Secondary | ICD-10-CM

## 2022-03-22 DIAGNOSIS — Z992 Dependence on renal dialysis: Secondary | ICD-10-CM

## 2022-03-22 LAB — HEPATITIS B CORE ANTIBODY, TOTAL: Hep B Core Total Ab: NONREACTIVE

## 2022-03-22 LAB — LIPID PANEL
Cholesterol: 107 mg/dL (ref 0–200)
HDL: 35 mg/dL — ABNORMAL LOW (ref >40)
LDL Cholesterol: 55 mg/dL (ref 0–99)
Total CHOL/HDL Ratio: 3.1 RATIO
Triglycerides: 84 mg/dL (ref <150)
VLDL: 17 mg/dL (ref 0–40)

## 2022-03-22 LAB — GLUCOSE, CAPILLARY
Glucose-Capillary: 102 mg/dL — ABNORMAL HIGH (ref 70–99)
Glucose-Capillary: 76 mg/dL (ref 70–99)
Glucose-Capillary: 79 mg/dL (ref 70–99)
Glucose-Capillary: 122 mg/dL — ABNORMAL HIGH (ref 70–99)
Glucose-Capillary: 195 mg/dL — ABNORMAL HIGH (ref 70–99)
Glucose-Capillary: 169 mg/dL — ABNORMAL HIGH (ref 70–99)

## 2022-03-22 MED ORDER — ASPIRIN 81 MG PO CHEW
81.0000 mg | CHEWABLE_TABLET | Freq: Every day | ORAL | Status: DC
  Administered 2022-03-22 – 2022-03-23 (×2): 81 mg via ORAL
  Filled 2022-03-22 (×2): qty 1

## 2022-03-22 MED ORDER — LABETALOL HCL 5 MG/ML IV SOLN: 5.0000 mg | INTRAVENOUS | Status: DC | PRN

## 2022-03-22 MED ORDER — PRAVASTATIN SODIUM 40 MG PO TABS
40.0000 mg | ORAL_TABLET | Freq: Every day | ORAL | Status: DC
  Administered 2022-03-22 – 2022-03-24 (×3): 40 mg via ORAL
  Filled 2022-03-22 (×3): qty 1

## 2022-03-22 MED ORDER — CLOPIDOGREL BISULFATE 75 MG PO TABS
75.0000 mg | ORAL_TABLET | Freq: Every day | ORAL | Status: DC
  Administered 2022-03-22 – 2022-03-23 (×2): 75 mg via ORAL
  Filled 2022-03-22 (×2): qty 1

## 2022-03-22 MED ORDER — CHLORHEXIDINE GLUCONATE CLOTH 2 % EX PADS
6.0000 | MEDICATED_PAD | Freq: Every day | CUTANEOUS | Status: DC
Start: 2022-03-22 — End: 2022-03-25
  Administered 2022-03-22 – 2022-03-25 (×4): 6 via TOPICAL

## 2022-03-22 MED ORDER — AMITRIPTYLINE HCL 25 MG PO TABS
25.0000 mg | ORAL_TABLET | Freq: Every day | ORAL | Status: DC
  Administered 2022-03-22 – 2022-03-24 (×3): 25 mg via ORAL
  Filled 2022-03-22 (×3): qty 1

## 2022-03-22 NOTE — Progress Notes (Signed)
Attempted to call report to 3W nurse was unavailable. Contact number was given to call for report. NT was sent for the bed. Family has been notified of pending transfer to [phone].

## 2022-03-22 NOTE — Progress Notes (Signed)
   Inpatient Rehab Admissions Coordinator :  Per therapy recommendations patient was screened for CIR candidacy by Danne Baxter RN MSN. Patient is not yet at a level to tolerate the intensity required to pursue a CIR admit . Noted she uses bilateral prosthesis and is very mobile at baseline. Patient may have the potential to progress to become a candidate. The CIR admissions team will follow and monitor for progress and place a Rehab Consult order if felt to be appropriate. Please contact me with any questions.  Danne Baxter RN MSN Admissions Coordinator [phone]

## 2022-03-22 NOTE — Evaluation (Signed)
Physical Therapy Evaluation Patient Details Name: Cindy Osborne MRN: [id number] DOB: October 31, 1965 Today's Date: 03/22/2022  History of Present Illness  Pt is a 56 y.o. female who presented 03/21/22 with acute onset L-sided weakness and hemineglect. Imaging revealed R MCA infarct. PMH includes: Bil BKA, DM, HLD, HTN, recent admission for R MCA CVA s/p thrombectomy June 2023   Clinical Impression  Pt presents with condition above and deficits mentioned below, see PT Problem List. PTA, she was mod I with her RW and her bil prosthetic devices. She lives alone in a 1-level house with a ramp entrance. Currently, pt demonstrates deficits in L-sided strength, vision in her L visual field, cognition, balance, and activity tolerance. She required TA x2 for bed mobility and min guard-minA to prevent L lateral LOB when sitting EOB. As pt has had a drastic functional decline compared to baseline, recommending intensive therapy in the AIR setting. Will continue to follow acutely.     Recommendations for follow up therapy are one component of a multi-disciplinary discharge planning process, led by the attending physician.  Recommendations may be updated based on patient status, additional functional criteria and insurance authorization.  Follow Up Recommendations Acute inpatient rehab (3hours/day)      Assistance Recommended at Discharge Frequent or constant Supervision/Assistance  Patient can return home with the following  A lot of help with walking and/or transfers;A lot of help with bathing/dressing/bathroom;Assistance with cooking/housework;Direct supervision/assist for medications management;Direct supervision/assist for financial management;Assist for transportation;Help with stairs or ramp for entrance    Equipment Recommendations Hospital bed;Other (comment) (hoyer equipment; pending progress)  Recommendations for Other Services  Rehab consult    Functional Status Assessment Patient has had a  recent decline in their functional status and demonstrates the ability to make significant improvements in function in a reasonable and predictable amount of time.     Precautions / Restrictions Precautions Precautions: Fall;Other (comment) Precaution Comments: prior bil BKA (family bringing prosthetics) Restrictions Weight Bearing Restrictions: No      Mobility  Bed Mobility Overal bed mobility: Needs Assistance Bed Mobility: Supine to Sit, Sit to Supine     Supine to sit: Total assist, +2 for physical assistance, +2 for safety/equipment, HOB elevated Sit to supine: Total assist, +2 for physical assistance, +2 for safety/equipment, HOB elevated   General bed mobility comments: Pt already laying diagonal in bed with legs towards L side, thus continued transition to sit EOB, but required TAx2 to manage hips and push trunk up to sit as pt unable to reach L bed rail with R hand. TAx2 to manage trunk and legs back to supine, cuing pt to lean laterally onto L elbow, but pt having difficulty placing it.    Transfers                   General transfer comment: deferred, awaiting bil prosthetic devices    Ambulation/Gait               General Gait Details: deferred, awaiting bil prosthetic devices  Stairs            Wheelchair Mobility    Modified Rankin (Stroke Patients Only) Modified Rankin (Stroke Patients Only) Pre-Morbid Rankin Score: Slight disability Modified Rankin: Severe disability     Balance Overall balance assessment: Needs assistance Sitting-balance support: Single extremity supported, Feet unsupported Sitting balance-Leahy Scale: Poor Sitting balance - Comments: Pt with L lateral lean, needing cues to correct, min guard-minA for static sitting balance. Postural control: Left lateral lean  Standing balance comment: deferred, awaiting bil prosthetic devices                             Pertinent Vitals/Pain Pain  Assessment Pain Assessment: Faces Faces Pain Scale: Hurts even more Pain Location: L arm, L hip, low back Pain Descriptors / Indicators: Discomfort, Grimacing Pain Intervention(s): Limited activity within patient's tolerance, Monitored during session, Repositioned    Home Living Family/patient expects to be discharged to:: Private residence Living Arrangements: Alone Available Help at Discharge: Family Type of Home: House Home Access: Ramped entrance       Home Layout: One level Home Equipment: Conservation officer, nature (2 wheels);Cane - single point;Tub bench;Wheelchair - manual;Grab bars - tub/shower;BSC/3in1;Adaptive equipment (has hand held shower if needed) Additional Comments: having bathroom redone    Prior Function Prior Level of Function : Independent/Modified Independent;Driving             Mobility Comments: uses a RW with her bil prosthetics ADLs Comments: pt reports full independence     Hand Dominance   Dominant Hand: Right    Extremity/Trunk Assessment   Upper Extremity Assessment Upper Extremity Assessment: Defer to OT evaluation    Lower Extremity Assessment Lower Extremity Assessment: LLE deficits/detail LLE Deficits / Details: Grossly weakn compared to R, but able to lift against gravity and against some resistance (difficulty following cues to hold contraction against resistance); able to detect touch with accuracy, denied numbness/tingling LLE Sensation: WNL LLE Coordination: decreased gross motor    Cervical / Trunk Assessment Cervical / Trunk Assessment: Normal  Communication   Communication: No difficulties  Cognition Arousal/Alertness: Awake/alert Behavior During Therapy: WFL for tasks assessed/performed Overall Cognitive Status: Impaired/Different from baseline Area of Impairment: Attention, Following commands, Safety/judgement, Awareness, Problem solving                   Current Attention Level: Sustained   Following Commands:  Follows one step commands with increased time Safety/Judgement: Decreased awareness of safety, Decreased awareness of deficits Awareness: Intellectual Problem Solving: Slow processing, Decreased initiation, Difficulty sequencing, Requires verbal cues, Requires tactile cues General Comments: Pt with poor attention span and low awareness of her deficits. She is aware her L side is weak but unaware of her visual deficits. Has difficulty problem-solving how to change body positions or correct leaning for improved alignment in bed or on EOB.        General Comments General comments (skin integrity, edema, etc.): VSS on supplemental O2 via [place]    Exercises     Assessment/Plan    PT Assessment Patient needs continued PT services  PT Problem List Decreased strength;Decreased activity tolerance;Decreased balance;Decreased mobility;Decreased cognition;Decreased coordination;Decreased knowledge of use of DME;Decreased safety awareness;Cardiopulmonary status limiting activity;Pain       PT Treatment Interventions DME instruction;Gait training;Functional mobility training;Therapeutic activities;Therapeutic exercise;Balance training;Neuromuscular re-education;Cognitive remediation;Patient/family education;Wheelchair mobility training    PT Goals (Current goals can be found in the Care Plan section)  Acute Rehab PT Goals Patient Stated Goal: to improve PT Goal Formulation: With patient/family Time For Goal Achievement: 04/05/22 Potential to Achieve Goals: Good    Frequency Min 4X/week     Co-evaluation PT/OT/SLP Co-Evaluation/Treatment: Yes Reason for Co-Treatment: For patient/therapist safety;To address functional/ADL transfers PT goals addressed during session: Mobility/safety with mobility;Balance         AM-PAC PT "6 Clicks" Mobility  Outcome Measure Help needed turning from your back to your side while in a flat bed without using  bedrails?: A Lot Help needed moving from lying on  your back to sitting on the side of a flat bed without using bedrails?: Total Help needed moving to and from a bed to a chair (including a wheelchair)?: Total Help needed standing up from a chair using your arms (e.g., wheelchair or bedside chair)?: Total Help needed to walk in hospital room?: Total Help needed climbing 3-5 steps with a railing? : Total 6 Click Score: 7    End of Session Equipment Utilized During Treatment: Oxygen Activity Tolerance: Patient tolerated treatment well Patient left: in bed;with call bell/phone within reach;with bed alarm set;with family/visitor present Nurse Communication: Mobility status PT Visit Diagnosis: Unsteadiness on feet (R26.81);Muscle weakness (generalized) (M62.81);Difficulty in walking, not elsewhere classified (R26.2);Pain;Hemiplegia and hemiparesis Hemiplegia - Right/Left: Left Hemiplegia - dominant/non-dominant: Non-dominant Hemiplegia - caused by: Cerebral infarction Pain - Right/Left: Left Pain - part of body: Arm;Hip    Time: 7517-0017 PT Time Calculation (min) (ACUTE ONLY): 23 min   Charges:   PT Evaluation $PT Eval Moderate Complexity: 1 Mod          Moishe Spice, PT, DPT Acute Rehabilitation Services  Office: [phone]   Orvan Falconer 03/22/2022, 11:58 AM

## 2022-03-22 NOTE — Evaluation (Signed)
Speech Language Pathology Evaluation Patient Details Name: Cindy Osborne MRN: [id number] DOB: January 16, 1966 Today's Date: 03/22/2022 Time: 0932-3557 SLP Time Calculation (min) (ACUTE ONLY): 15 min  Problem List:  Patient Active Problem List   Diagnosis Date Noted   Stroke ([place]) 03/21/2022   Stroke (cerebrum) ([place]) 03/21/2022   Hyperlipidemia 01/24/2022   Obesity 01/24/2022   Acute right arterial ischemic stroke, MCA (middle cerebral artery) ([place]) 01/21/2022   Combined forms of age-related cataract of both eyes 06/30/2021   Diabetic macular edema ([place]) 06/30/2021   Proteinuria 32/20/2542   Metabolic acidosis 70/62/3762   Encounter for insertion of mirena IUD 03/15/2021   Panic disorder 03/03/2021   Acquired absence of left leg below knee ([place]) 10/26/2020   PVD (peripheral vascular disease) ([place]) 07/28/2020   Uncontrolled type 2 diabetes mellitus with diabetic peripheral angiopathy without gangrene 02/26/2020   Acquired absence of right leg below knee ([place]) 07/28/2019   Diabetic retinopathy ([place]) 11/23/2016   Slow transit constipation 09/24/2015   Environmental and seasonal allergies 07/09/2015   Anemia associated with chronic renal failure 07/09/2015   ESRD (end stage renal disease) on dialysis ([place]) 07/09/2015   Type II diabetes mellitus with complication ([place]) 83/15/1761   Herpes simplex infection 07/09/2015   Hyperlipidemia associated with type 2 diabetes mellitus ([place]) 07/09/2015   Heart & renal disease, hypertensive, with heart failure ([place]) 07/09/2015   Arthritis of knee, degenerative 07/09/2015   Tendinitis 07/09/2015   Avitaminosis D 07/09/2015   Obstructive apnea 12/15/2014   Intracervical pessary 01/07/2014   Essential hypertension 09/11/2013   Past Medical History:  Past Medical History:  Diagnosis Date   Amputated toe ([place]) 01/31/2012   Right second toe distal phalanx Right great toe   Diabetes mellitus without complication ([place])    History of diabetic ulcer of  foot 04/01/2014   Hyperlipidemia    Hypertension    Stroke ([place]) 01/21/2022   Past Surgical History:  Past Surgical History:  Procedure Laterality Date   angioplasty politeal Left 07/2020   avg for dialysis     CHOLECYSTECTOMY     COLONOSCOPY  08/15/2011   cleared for 10 yrs   IR CT HEAD LTD  01/21/2022   IR CT HEAD LTD  03/21/2022   IR PERCUTANEOUS ART THROMBECTOMY/INFUSION INTRACRANIAL INC DIAG ANGIO  01/21/2022   IR PERCUTANEOUS ART THROMBECTOMY/INFUSION INTRACRANIAL INC DIAG ANGIO  03/21/2022   IR US GUIDE VASC ACCESS RIGHT  01/21/2022   IR US GUIDE VASC ACCESS RIGHT  03/21/2022   LEG AMPUTATION BELOW KNEE Right 04/2019   LEG AMPUTATION BELOW KNEE Left 09/08/2020   RADIOLOGY WITH ANESTHESIA N/A 01/21/2022   Procedure: IR WITH ANESTHESIA;  Surgeon: Radiologist, Medication, MD;  Location: [facility];  Service: Radiology;  Laterality: N/A;   TOE AMPUTATION Right 10/15/2018   HPI:  56yo female admitted 03/21/22 with acute onset left facial droop, left side weakness and left hemineglect. Intubated briefly for angiogram. PMH: DM, diabetic foot ulcer, bilateral BKA, ESRD on HD, HLD, HTN, R MCA (01/2022) with R MCA thrombectomy (no residual deficits). MRI =Moderate-sized patchy acute ischemic right MCA distribution   Assessment / Plan / Recommendation Clinical Impression  Pt seen for assessment of cognitive linguistic status. Pt presents with mild left orofacial weakness and mild dysarthria. Receptive and Expressive Language skills are intact. The [place] Mental Status (SLUMS) Examination was administered. Pt scored 22/30 (n=27+/30). Pt was accurate for orientation questions, and was able demonstrate immediate recall of 5/5 unrelated words. Pt had difficulty with mental  math, thought organization, delayed recall (3/5 recalled after a delay), and digit reversal. Auditory attention and recall was 75%. Clock drawing was fairly accurate, but exhibiting mild left inattention, with extra space between  the 3 and the 4. Numbers 7-11 were not written at the edge of the circle. Hour markers were correct. Pt acheived the same score in June 2023, which was documented as baseline performance at that time.    SLP Assessment  SLP Recommendation/Assessment: All further Speech Language Pathology needs can be addressed in the next venue of care (if needs arise)  SLP Visit Diagnosis: Cognitive communication deficit (R41.841);Dysarthria and anarthria (R47.1)    Recommendations for follow up therapy are one component of a multi-disciplinary discharge planning process, led by the attending physician.  Recommendations may be updated based on patient status, additional functional criteria and insurance authorization.    Follow Up Recommendations  Follow physician's recommendations for discharge plan and follow up therapies    Assistance Recommended at Discharge  Intermittent Supervision/Assistance  Functional Status Assessment Patient has had a recent decline in their functional status and demonstrates the ability to make significant improvements in function in a reasonable and predictable amount of time.     SLP Evaluation Cognition  Overall Cognitive Status: History of cognitive impairments - at baseline Arousal/Alertness: Awake/alert Orientation Level: Oriented X4       Comprehension  Auditory Comprehension Overall Auditory Comprehension: Appears within functional limits for tasks assessed    Expression Expression Primary Mode of Expression: Verbal Verbal Expression Overall Verbal Expression: Appears within functional limits for tasks assessed Written Expression Dominant Hand: Right   Oral / Motor  Oral Motor/Sensory Function Overall Oral Motor/Sensory Function: Mild impairment Facial ROM: Reduced left Facial Symmetry: Abnormal symmetry left Facial Strength: Reduced left Facial Sensation: Within Functional Limits Lingual ROM: Reduced left Lingual Symmetry: Abnormal symmetry left Lingual  Strength: Reduced Lingual Sensation: Within Functional Limits Mandible: Within Functional Limits Motor Speech Overall Motor Speech: Impaired Respiration: Within functional limits Phonation: Normal Resonance: Within functional limits Articulation: Impaired Level of Impairment: Conversation Intelligibility: Intelligibility reduced Word: 75-100% accurate Phrase: 75-100% accurate Sentence: 75-100% accurate Conversation: 75-100% accurate Motor Planning: Witnin functional limits Motor Speech Errors: Not applicable           [place], [facility], [place] Speech Language Pathologist Office: [phone]  Shonna Chock 03/22/2022, 9:57 AM

## 2022-03-22 NOTE — Progress Notes (Signed)
Referring Physician(s): Dr. Frazier Richards  Supervising Physician: Frazier Richards  Patient Status:  [facility] - In-pt  Chief Complaint:  Right M1/M2 partial occlusion, s/p mechanical thrombectomy   Subjective:  Pt lying in bed. She is A&O. Pt states she wants to go to rehab.   Allergies: Augmentin [amoxicillin-pot clavulanate], Atorvastatin, Pioglitazone, Peanut-containing drug products, Rosuvastatin, Shellfish-derived products, and Sulfa antibiotics  Medications: Prior to Admission medications   Medication Sig Start Date End Date Taking? Authorizing Provider  amitriptyline (ELAVIL) 25 MG tablet Take 25 mg by mouth at bedtime.    [provider]  amLODipine (NORVASC) 10 MG tablet Take 10 mg by mouth daily.    [provider]  aspirin 81 MG chewable tablet Chew 1 tablet (81 mg total) by mouth daily. 02/22/22   Glean Hess, MD  fexofenadine (ALLEGRA) 180 MG tablet Take 180 mg by mouth daily.    [provider]  Insulin Glargine (BASAGLAR KWIKPEN [place]) Inject 10-12 Units into the skin daily.    [provider]  metoprolol (TOPROL-XL) 200 MG 24 hr tablet Take 1 tablet (200 mg total) by mouth daily. Take with or immediately following a meal. 01/25/22   August Albino, NP  montelukast (SINGULAIR) 10 MG tablet Take 1 tablet (10 mg total) by mouth at bedtime. Patient taking differently: Take 10 mg by mouth as needed. 01/24/22   August Albino, NP  pravastatin (PRAVACHOL) 40 MG tablet Take 1 tablet (40 mg total) by mouth daily at 6 PM. 03/07/22   Glean Hess, MD  sucroferric oxyhydroxide (VELPHORO) 500 MG chewable tablet Chew 500 mg by mouth 3 (three) times daily with meals.    [provider]     Vital Signs: BP 123/72   Pulse 91   Temp 98.2 F (36.8 C) (Axillary)   Resp 19   Wt 223 lb 15.8 oz (101.6 kg)   SpO2 96%   BMI 33.08 kg/m   Physical Exam Vitals reviewed.  Constitutional:      General: She is not in acute distress.     Appearance: She is obese. She is ill-appearing.  HENT:     Head: Normocephalic and atraumatic.     Mouth/Throat:     Mouth: Mucous membranes are dry.     Pharynx: Oropharynx is clear.  Eyes:     Extraocular Movements: Extraocular movements intact.     Pupils: Pupils are equal, round, and reactive to light.  Pulmonary:     Effort: Pulmonary effort is normal. No respiratory distress.  Musculoskeletal:     Comments: Bilateral BKA  Skin:    General: Skin is warm and dry.     Comments: Site is soft with no active bleeding and no appreciable pseudoaneurysm. Dressing is C/D/I   Neurological:     Mental Status: She is alert and oriented to person, place, and time.     Comments: Alert, aware and oriented X 3 Speech and comprehension is intact with  mild dysarthria.   PERRL bilaterally, Left hemianopia Left sided facial droop Tongue midline Hand grip strength RUE 5/5, LUE 2/5 RLE muscle strength 5/5, LLE 4/5  Fine motor and coordination slow but intact.  Sensation dull to LLE, LUE            Psychiatric:        Mood and Affect: Mood normal.        Behavior: Behavior normal.        Thought Content: Thought content normal.  Judgment: Judgment normal.     Imaging: VAS [place] LOWER EXTREMITY VENOUS (DVT)  Result Date: 03/22/2022  Lower Venous DVT Study Patient Name:  Cindy Osborne  Date of Exam:   03/22/2022 Medical Rec #: [id number]          Accession #:    [id number] Date of Birth: [birth year]           Patient Gender: F Patient Age:   56 years Exam Location:  [facility] Procedure:      VAS [place] LOWER EXTREMITY VENOUS (DVT) Referring Phys: Cornelius Moras XU --------------------------------------------------------------------------------  Indications: Stroke.  Comparison Study: No previous exam noted. Performing Technologist: Bobetta Lime BS, RVT  Examination Guidelines: A complete evaluation includes B-mode imaging, spectral Doppler, color Doppler, and power Doppler as needed of  all accessible portions of each vessel. Bilateral testing is considered an integral part of a complete examination. Limited examinations for reoccurring indications may be performed as noted. The reflux portion of the exam is performed with the patient in reverse Trendelenburg.  +---------+---------------+---------+-----------+----------+--------------+ RIGHT    CompressibilityPhasicitySpontaneityPropertiesThrombus Aging +---------+---------------+---------+-----------+----------+--------------+ CFV      Full           Yes      Yes                                 +---------+---------------+---------+-----------+----------+--------------+ SFJ      Full                                                        +---------+---------------+---------+-----------+----------+--------------+ FV Prox  Full                                                        +---------+---------------+---------+-----------+----------+--------------+ FV Mid   Full                                                        +---------+---------------+---------+-----------+----------+--------------+ FV DistalFull                                                        +---------+---------------+---------+-----------+----------+--------------+ PFV      Full                                                        +---------+---------------+---------+-----------+----------+--------------+ POP      Full           Yes      Yes                                 +---------+---------------+---------+-----------+----------+--------------+  Right Technical Findings: Not visualized segments include posterior tibial and peroneal veins due to BKA.  +---------+---------------+---------+-----------+----------+--------------+ LEFT     CompressibilityPhasicitySpontaneityPropertiesThrombus Aging +---------+---------------+---------+-----------+----------+--------------+ CFV      Full           Yes      Yes                                  +---------+---------------+---------+-----------+----------+--------------+ SFJ      Full                                                        +---------+---------------+---------+-----------+----------+--------------+ FV Prox  Full                                                        +---------+---------------+---------+-----------+----------+--------------+ FV Mid   Full                                                        +---------+---------------+---------+-----------+----------+--------------+ FV DistalFull                                                        +---------+---------------+---------+-----------+----------+--------------+ PFV      Full                                                        +---------+---------------+---------+-----------+----------+--------------+ POP      Full           Yes      Yes                                 +---------+---------------+---------+-----------+----------+--------------+   Left Technical Findings: Not visualized segments include posterior tibial and posterior tibial veins due to BKA.   Summary: BILATERAL: - No evidence of deep vein thrombosis seen in the lower extremities, bilaterally. -No evidence of popliteal cyst, bilaterally. RIGHT: - Right BKA  LEFT: - Left BKA  *See table(s) above for measurements and observations.    Preliminary    MR BRAIN WO CONTRAST  Result Date: 03/22/2022 CLINICAL DATA:  Follow-up examination for stroke. EXAM: MRI HEAD WITHOUT CONTRAST TECHNIQUE: Multiplanar, multiecho pulse sequences of the brain and surrounding structures were obtained without intravenous contrast. COMPARISON:  Prior CT from 03/21/2022 as well as brain MRI from 01/22/2022. FINDINGS: Brain: Cerebral volume stable, and remains within normal limits. There has been normal expected interval evolution of previously identified right cerebral infarcts since 01/22/2022. Additional chronic  infarcts involving the left parieto-occipital region and left cerebellum noted as well. Mild  scattered chronic hemosiderin staining noted about a few of these chronic infarcts. Patchy multifocal restricted diffusion involving the right frontal and parietal lobes, consistent with acute right MCA distribution infarct. Involvement is most pronounced at the deep white matter of the right frontal lobe. Scattered areas of associated petechial hemorrhage, most pronounced at the right precentral gyrus (series 12, image 43) Heidelberg classification 1b: HI2, confluent petechiae, no mass effect. No frank hemorrhagic transformation or significant mass effect. No other acute or subacute ischemia elsewhere within the brain. No other acute intracranial hemorrhage. No mass lesion, midline shift, or mass effect. No hydrocephalus or extra-axial fluid collection. Pituitary gland and suprasellar region within normal limits. Vascular: Major intracranial vascular flow voids are maintained. Skull and upper cervical spine: Craniocervical junction within normal limits. Bone marrow signal intensity diffusely decreased on T1 weighted sequence, nonspecific, but most commonly related to anemia, smoking or obesity. No focal marrow replacing lesion. No scalp soft tissue abnormality. Sinuses/Orbits: Globes and orbital soft tissues within normal limits. Paranasal sinuses are largely clear. Trace right mastoid effusion, of doubtful significance. Other: None. IMPRESSION: 1. Moderate-sized patchy acute ischemic right MCA distribution infarct as above. Associated petechial hemorrhage without frank hemorrhagic transformation or significant mass effect. 2. Few scattered underlying chronic ischemic infarcts as above. No other acute intracranial abnormality. Electronically Signed   By: Jeannine Boga M.D.   On: 03/22/2022 05:59   IR US Guide Vasc Access Right  Result Date: 03/21/2022 INDICATION: Right MCA syndrome; right common femoral arterial  access under ultrasound guidance EXAM: ULTRASOUND-GUIDED VASCULAR ACCESS DIAGNOSTIC CEREBRAL ANGIOGRAM MECHANICAL THROMBECTOMY FLAT PANEL HEAD CT COMPARISON:  None Available. MEDICATIONS: No antibiotic was administered. ANESTHESIA/SEDATION: General anesthesia TECHNIQUE: Real-time ultrasound guidance was utilized for vascular access including the acquisition of permanent ultrasound image documenting patency of the accessed right common femoral artery. COMPLICATIONS: None immediate. FINDINGS: Common femoral artery was patent PROCEDURE: Mechanical thrombectomy was performed by stentriever technique for high-grade partial occlusion of the proximal M2/right MCA branches with TICI 3 results IMPRESSION: Ultrasound-guided access into the right common femoral artery. Mechanical thrombectomy by stentriever technique for high-grade partial occlusion of the proximal M2 superior and inferior divisions of the right MCA with TICI 3 results PLAN: Admission to ICU Blood pressure goal of 120-140 mm Hg Please refer to XA IR percutaneous artery thrombectomy/infusion for detailed report. Electronically Signed   By: Frazier Richards M.D.   On: 03/21/2022 17:17   DG CHEST PORT 1 VIEW  Result Date: 03/21/2022 CLINICAL DATA:  [id number]; encounter for BiPAP use counseling EXAM: PORTABLE CHEST 1 VIEW COMPARISON:  None Available. FINDINGS: Moderate cardiomegaly. Moderately large right pleural effusion. Pulmonary edema. There are likely superimposed atelectasis-infiltrations at the right lower lobe. Moderate thoracic spondylosis. IMPRESSION: Moderate cardiomegaly and pulmonary edema. Right pleural effusion with likely superimposed right basal atelectasis-infiltrations. Electronically Signed   By: Frazier Richards M.D.   On: 03/21/2022 16:18   IR PERCUTANEOUS ART THROMBECTOMY/INFUSION INTRACRANIAL INC DIAG ANGIO  Result Date: 03/21/2022 INDICATION: 56 year old female with multiple medical issues with history of hypertension, hyperlipidemia,  diabetes mellitus, previous stroke, status post right M1 thrombectomy on 01/21/2022, presented with right MCA syndrome. EXAM: ULTRASOUND-GUIDED VASCULAR ACCESS DIAGNOSTIC CEREBRAL ANGIOGRAM MECHANICAL THROMBECTOMY FLAT PANEL HEAD CT COMPARISON:  None Available. MEDICATIONS: Ancef 2 g IV. The antibiotic was administered within 1 hour of the procedure Performing surgeon: Dr. Frazier Richards Assistant: Dr. Karenann Cai ANESTHESIA/SEDATION: The procedure was performed under general anesthesia. CONTRAST:  50 mL of Omnipaque 300 milligram/mL FLUOROSCOPY: Fluoro time: 16.6 minutes  Radiation Exposure Index (as provided by the fluoroscopic device): 762.8 mGy Kerma COMPLICATIONS: SIR Level A - No therapy, no consequence. TECHNIQUE: Informed phone consent was obtained from the patient's niece after a thorough discussion of the procedural risks, benefits and alternatives. All questions were addressed. Maximal Sterile Barrier Technique was utilized including caps, mask, sterile gowns, sterile gloves, sterile drape, hand hygiene and skin antiseptic. A timeout was performed prior to the initiation of the procedure. The right groin was prepped and draped in the usual sterile fashion. Using a micropuncture kit and the modified Seldinger technique, access was gained to the right common femoral artery and an 8 [place] by 25 cm sheath was placed. Real-time ultrasound guidance was utilized for vascular access including the acquisition of a permanent ultrasound image documenting patency of the accessed vessel. Under fluoroscopy, a Zoom 88 guide catheter was navigated over a 5 [place] neuron select Berenstein catheter and a 0.035" Terumo Glidewire into the aortic arch. The catheter was placed into the right common carotid artery and then advanced into the right internal carotid artery under roadmap guidance. The diagnostic catheter was removed. Frontal and lateral angiograms of the head were obtained. FINDINGS: 1. Normal caliber of the  right common femoral artery, adequate for vascular access. 2. High-grade partial occlusion of the proximal M2/MCA (superior and inferior divisions). 3. Delayed flow into the distal branches of the MCA. PROCEDURE: Using biplane roadmap, a zoom 055 aspiration catheter was navigated over an Aristotle 24 microguidewire into the cavernous segment of the right ICA and then the Aristotle 024 subtle wire was navigated into the superior division of the right MCA. However, the aspiration catheter could not be advanced to the site of the high-grade partial occlusion. Next, phenom 21 and synchro 14 microwire were selected and micro catheterization of the M3 segment of the superior division of the right MCA was performed. Subsequently, 3 mm x 20 mm solitaire X stent was selected and was deployed spanning the M2/M1 segment. The device was allowed to intercalated with the clot for 3 minutes. The microcatheter was removed. The aspiration catheter was advanced to the level of occlusion and connected to an aspiration pump. The thrombectomy device and aspiration catheter were removed under constant aspiration. Post thrombectomy right internal carotid artery angiograms with frontal and lateral views showed recanalization of the proximal right M2/MCA, TICI 3. Next, flat panel CT of the head was obtained and post processed in a separate workstation with concurrent attending physician supervision. Selected images were sent to PACS. Flat panel CT was negative for subarachnoid bleed. After about 10 minutes, biplane cerebral angiogram was repeated demonstrating continued recanalization of the right M2/MCA divisions. Right common femoral artery angiogram was obtained in right anterior oblique view. The puncture is at the level of the common femoral artery. The artery has normal caliber, adequate for closure device. The sheath was exchanged over the wire for an 8 [place] Angio-Seal which was utilized for access closure. Immediate hemostasis was  achieved. Patient was then extubated and transferred to the ICU in stable condition. IMPRESSION: 1. Successful mechanical thrombectomy for high-grade partial occlusion of the proximal M2 divisions (superior and inferior) of the right MCA using the stentriever technique x1. 2. Flat panel head CT was negative for subarachnoid bleed. PLAN: 1. Transfer to ICU. 2. Blood pressure goal of 120-140 mm Hg. Electronically Signed   By: Frazier Richards M.D.   On: 03/21/2022 15:42   IR CT Head Ltd  Result Date: 03/21/2022 INDICATION: 56 year old female with multiple  medical issues with history of hypertension, hyperlipidemia, diabetes mellitus, previous stroke, status post right M1 thrombectomy on 01/21/2022, presented with right MCA syndrome. EXAM: ULTRASOUND-GUIDED VASCULAR ACCESS DIAGNOSTIC CEREBRAL ANGIOGRAM MECHANICAL THROMBECTOMY FLAT PANEL HEAD CT COMPARISON:  None Available. MEDICATIONS: Ancef 2 g IV. The antibiotic was administered within 1 hour of the procedure Performing surgeon: Dr. Frazier Richards Assistant: Dr. Karenann Cai ANESTHESIA/SEDATION: The procedure was performed under general anesthesia. CONTRAST:  50 mL of Omnipaque 300 milligram/mL FLUOROSCOPY: Fluoro time: 16.6 minutes Radiation Exposure Index (as provided by the fluoroscopic device): 185.9 mGy Kerma COMPLICATIONS: SIR Level A - No therapy, no consequence. TECHNIQUE: Informed phone consent was obtained from the patient's niece after a thorough discussion of the procedural risks, benefits and alternatives. All questions were addressed. Maximal Sterile Barrier Technique was utilized including caps, mask, sterile gowns, sterile gloves, sterile drape, hand hygiene and skin antiseptic. A timeout was performed prior to the initiation of the procedure. The right groin was prepped and draped in the usual sterile fashion. Using a micropuncture kit and the modified Seldinger technique, access was gained to the right common femoral artery and an 8 [place] by  25 cm sheath was placed. Real-time ultrasound guidance was utilized for vascular access including the acquisition of a permanent ultrasound image documenting patency of the accessed vessel. Under fluoroscopy, a Zoom 88 guide catheter was navigated over a 5 [place] neuron select Berenstein catheter and a 0.035" Terumo Glidewire into the aortic arch. The catheter was placed into the right common carotid artery and then advanced into the right internal carotid artery under roadmap guidance. The diagnostic catheter was removed. Frontal and lateral angiograms of the head were obtained. FINDINGS: 1. Normal caliber of the right common femoral artery, adequate for vascular access. 2. High-grade partial occlusion of the proximal M2/MCA (superior and inferior divisions). 3. Delayed flow into the distal branches of the MCA. PROCEDURE: Using biplane roadmap, a zoom 055 aspiration catheter was navigated over an Aristotle 24 microguidewire into the cavernous segment of the right ICA and then the Aristotle 024 subtle wire was navigated into the superior division of the right MCA. However, the aspiration catheter could not be advanced to the site of the high-grade partial occlusion. Next, phenom 21 and synchro 14 microwire were selected and micro catheterization of the M3 segment of the superior division of the right MCA was performed. Subsequently, 3 mm x 20 mm solitaire X stent was selected and was deployed spanning the M2/M1 segment. The device was allowed to intercalated with the clot for 3 minutes. The microcatheter was removed. The aspiration catheter was advanced to the level of occlusion and connected to an aspiration pump. The thrombectomy device and aspiration catheter were removed under constant aspiration. Post thrombectomy right internal carotid artery angiograms with frontal and lateral views showed recanalization of the proximal right M2/MCA, TICI 3. Next, flat panel CT of the head was obtained and post processed in a  separate workstation with concurrent attending physician supervision. Selected images were sent to PACS. Flat panel CT was negative for subarachnoid bleed. After about 10 minutes, biplane cerebral angiogram was repeated demonstrating continued recanalization of the right M2/MCA divisions. Right common femoral artery angiogram was obtained in right anterior oblique view. The puncture is at the level of the common femoral artery. The artery has normal caliber, adequate for closure device. The sheath was exchanged over the wire for an 8 [place] Angio-Seal which was utilized for access closure. Immediate hemostasis was achieved. Patient was then extubated  and transferred to the ICU in stable condition. IMPRESSION: 1. Successful mechanical thrombectomy for high-grade partial occlusion of the proximal M2 divisions (superior and inferior) of the right MCA using the stentriever technique x1. 2. Flat panel head CT was negative for subarachnoid bleed. PLAN: 1. Transfer to ICU. 2. Blood pressure goal of 120-140 mm Hg. Electronically Signed   By: Frazier Richards M.D.   On: 03/21/2022 15:42   CT ANGIO HEAD NECK W WO CM W PERF (CODE STROKE)  Result Date: 03/21/2022 CLINICAL DATA:  Neuro deficit, acute, stroke suspected. EXAM: CT ANGIOGRAPHY HEAD AND NECK CT PERFUSION BRAIN TECHNIQUE: Multidetector CT imaging of the head and neck was performed using the standard protocol during bolus administration of intravenous contrast. Multiplanar CT image reconstructions and MIPs were obtained to evaluate the vascular anatomy. Carotid stenosis measurements (when applicable) are obtained utilizing NASCET criteria, using the distal internal carotid diameter as the denominator. Multiphase CT imaging of the brain was performed following IV bolus contrast injection. Subsequent parametric perfusion maps were calculated using RAPID software. RADIATION DOSE REDUCTION: This exam was performed according to the departmental dose-optimization program  which includes automated exposure control, adjustment of the mA and/or kV according to patient size and/or use of iterative reconstruction technique. CONTRAST:  181m OMNIPAQUE IOHEXOL 350 MG/ML SOLN COMPARISON:  CT angiogram January 21, 2022. FINDINGS: CTA NECK FINDINGS Aortic arch: Standard branching. Imaged portion shows no evidence of aneurysm or dissection. Atherosclerotic calcified plaques are seen in the aortic arch. No significant stenosis of the major arch vessel origins. Right carotid system: Atherosclerotic changes of the right carotid bifurcation without hemodynamically significant stenosis. Left carotid system: Mild atherosclerotic changes of the left carotid bifurcation without hemodynamically significant stenosis. Vertebral arteries: Atherosclerotic changes at the origin of the right vertebral artery resulting in severe stenosis. Skeleton: No acute or aggressive process identified. Other neck: 2.4 cm left thyroid lobe lobe nodule. Recommend nonemergent thyroid [place] Reference: J Am Coll Radiol. 2015 Feb;12(2): 143-50. Upper chest: Bilateral pleural effusion. Scattered opacities in the bilateral lung apices, some of them appear nodular. Review of the MIP images confirms the above findings CTA HEAD FINDINGS Anterior circulation: Short segment of occlusion of the proximal right M2/MCA posterior division branch with distal reconstitution by collaterals. Calcified plaques in the bilateral carotid siphons. Bilateral ACA is and left MCA have normal caliber. Posterior circulation: No significant stenosis, proximal occlusion, aneurysm, or vascular malformation. Venous sinuses: As permitted by contrast timing, patent. Anatomic variants: None significant. Review of the MIP images confirms the above findings CT Brain Perfusion Findings: ASPECTS: 9 CBF (<30%) Volume: 087mPerfusion (Tmax>6.0s) volume: 2180mismatch Volume: 40m74mfarction Location:Right parietal lobe. IMPRESSION: 1. Acute right M2/MCA posterior division  occlusion. 2. A 21 mL of ischemic penumbra right identified. 3. Scattered opacities in the bilateral lung apices, likely infectious in nature. However, given nodular appearance, follow-up chest CT is recommended. 4. A 2.4 cm thyroid lobe nodule. These results were called by telephone at the time of interpretation on 03/21/2022 at 1:36 pm to provider ERIC LINDColumbus Endoscopy Center LLCho verbally acknowledged these results. Electronically Signed   By: KatyPedro Earls.   On: 03/21/2022 13:48   CT HEAD CODE STROKE WO CONTRAST  Result Date: 03/21/2022 CLINICAL DATA:  Code stroke. Neuro deficit, acute, stroke suspected. EXAM: CT HEAD WITHOUT CONTRAST TECHNIQUE: Contiguous axial images were obtained from the base of the skull through the vertex without intravenous contrast. RADIATION DOSE REDUCTION: This exam was performed according to the departmental dose-optimization  program which includes automated exposure control, adjustment of the mA and/or kV according to patient size and/or use of iterative reconstruction technique. COMPARISON:  MRI of the brain January 22, 2022. FINDINGS: Brain: Small hypodense area involving the cortex in the right frontal lobe may represent acute infarct. Hypodensity within the deep white matter of the right frontal lobe without corresponding cortical involvement with no corresponding white matter disease seen on prior MRI. No evidence of acute hemorrhage, hydrocephalus, extra-axial collection or mass lesion/mass effect. Remote infarcts in the right insula, temporal and parietal lobes corresponding to infarcts demonstrated on prior MRI. Vascular: No hyperdense vessel or unexpected calcification. Skull: Normal. Negative for fracture or focal lesion. Sinuses/Orbits: No acute finding. Other: None. ASPECTS [facility] Stroke Program Early CT Score) - Ganglionic level infarction (caudate, lentiform nuclei, internal capsule, insula, M1-M3 cortex): 7 - Supraganglionic infarction (M4-M6 cortex): 1 Total  score (0-10 with 10 being normal): 9 IMPRESSION: 1. Hypodensity involving the right frontal cortex concerning for acute infarct. 2. Large area of hypodensity within the deep white matter of the right frontal lobe without corresponding cortical hypodensity. No white matter disease seen on prior MRI, question evolving infarct. 3. ASPECTS is 9. These results were called by telephone at the time of interpretation on 03/21/2022 at 1:19 pm to provider [facility] , who verbally acknowledged these results. Electronically Signed   By: Pedro Earls M.D.   On: 03/21/2022 13:20    Labs:  CBC: Recent Labs    01/21/22 1147 01/21/22 1154 01/23/22 0526 03/21/22 1320 03/21/22 1322  WBC 8.0  --  8.3 13.8*  --   HGB 10.9* 11.9* 10.0* 12.8 13.9  HCT 35.4* 35.0* 32.7* 39.6 41.0  PLT 181  --  176 227  --     COAGS: Recent Labs    01/21/22 1147 03/21/22 1320  INR 1.2 1.3*  APTT 29 29    BMP: Recent Labs    01/21/22 1147 01/21/22 1154 01/22/22 1018 01/23/22 0526 03/21/22 1320 03/21/22 1322  NA 137   < > 135 135 139 138  K 3.7   < > 4.2 4.6 5.1 4.8  CL 98   < > 95* 94* 99 103  CO2 27  --  _0 --   GLUCOSE 147*   < > 178* 85 193* 187*  BUN 47*   < > 60* 71* 71* 62*  CALCIUM 9.6  --  9.5 9.8 10.0  --   CREATININE 9.16*   < > 10.07* 11.33* 10.25* 11.30*  GFRNONAA 5*  --  4* 4* 4*  --    < > = values in this interval not displayed.    LIVER FUNCTION TESTS: Recent Labs    01/21/22 1147 01/22/22 1018 01/23/22 0526 03/21/22 1320  BILITOT 0.6  --   --  0.9  AST 12*  --   --  44*  ALT 9  --   --  18  ALKPHOS 153*  --   --  151*  PROT 7.2  --   --  8.4*  ALBUMIN 3.5 3.1* 3.4* 3.7    Assessment and Plan:  Right M1/M2 partial occlusion, s/p mechanical thrombectomy with Dr. Gerhard Perches 03/21/22.   Pt lying in bed. She is A&O. Pt states she wants to go to rehab.  She adds that she has been NPO since she has been admitted.   No further follow-up from [facility] Further  plan per neurology/medical team.  Please call NIR for questions  or concerns.     Electronically Signed: Tyson Alias, NP 03/22/2022, 10:23 AM   I spent a total of 15 Minutes at the the patient's bedside AND on the patient's hospital floor or unit, greater than 50% of which was counseling/coordinating care for mechanical thrombectomy.

## 2022-03-22 NOTE — Progress Notes (Signed)
Received patient in ICU bed. Alert and oriented.  Informed consent signed and in  chart.   Treatment initiated: 2150H Treatment completed: 0215H  Patient did not tolerated tx. BP dropped to sys 80's and 40's during tx of UF more than 1.5L.   Left the room alert, without acute distress.  Hand-off given to patient's nurse.   Access used: Left upper AVF Access issues: None  Total UF removed: 1361ml  Medication(s) given: None  Post HD VS:  BP 120/60 mmHg, HR 95bpm, RR 22brpm, Sats 99% on 4L [place]  Post HD weight: 101.6kg   [facility]

## 2022-03-22 NOTE — Progress Notes (Addendum)
STROKE TEAM PROGRESS NOTE   ATTENDING NOTE: I reviewed above note and agree with the assessment and plan. Pt was seen and examined.   56 year old female with history of diabetes, hypertension, hyperlipidemia, ESRD on hemodialysis, PVD with bilateral BKA, stroke in 01/2022 admitted for left facial droop, left sided weakness and left neglect.  Patient was admitted 01/23/2022 for left-sided weakness numbness.  CT showed right MCA hyperdense sign.  Status post TNK.  CT head and neck showed right M1 occlusion.  Status post IR with TICI3 reperfusion.  MRI showed small right MCA infarcts.  MRI showed right MCA patent.  EF 55 to 60%.  LDL 79, A1c 6.0.  Patient discharged on DAPT and Lipitor 40.  Patient reported no residual left from the stroke.  At baseline patient can walk with cane using prosthetics.  This time patient admitted for left facial droop, left-sided weakness left neglect, fell in the bathroom.  CT showed right frontal hypodensity.  CTA head and neck right M2 occlusion.  CTP 0/21.  Status post IR with TICI3 reperfusion.  MRI showed moderate scattered right MCA infarcts.  A1c 6.0, LDL 55.  LE venous Doppler negative for DVT.  WBC 13.8.  Creatinine 10.25.  On exam, patient awake alert lying bed, no family around.  AOx3, mild to moderate dysarthria.  Visual fields full, no gaze palsy, left facial droop. LUE proximal 2/5  L finger grip 3/5  LLE BKA 4/5.  Left lower extremity decreased light touch sensation.  Right finger-to-nose intact.  Etiology for patient recurrent embolic stroke not quite clear, concerning for cardioembolic source.  Last time patient was recommended to have outpatient TEE and loop recorder placed but was not done.  Will schedule TEE on Friday and loop recorder to rule out A-fib.  Patient passed swallow, put on DAPT and home pravastatin.  Nephrology on board for HD.  BP goal 1 20-1 40 within 24 hours post IR.  After that BP less than 180/105.  Aggressive risk factor modification.   PT/OT recommend CIR.  For detailed assessment and plan, please refer to below as I have made changes wherever appropriate.   Cindy Hawking, MD PhD Stroke Neurology 03/22/2022 5:13 PM    INTERVAL HISTORY Cindy Osborne is an 56 y.o. female with a PMHx of DM, diabetic foot ulcer, bilateral BKA, ESRD on HD, HLD, HTN and right MCA stroke in June of this year who presents as a Code Stroke via EMS with acute onset of left facial droop, left sided weakness and left hemineglect. At her admission in June for acute stroke she had undergone right MCA thrombectomy with small residual foci of restricted diffusion on MRI following the procedure.   Patient was discharged home on ASA 81 mg /Plavix 75 mg x 21 days and then ASA only. Patient was suppose to have outpatient clinic follow up for an TEE and Loop Recorder. Patient was seen for her stroke follow up on 03/20/22 with Dr Bonnita Levan. She stated that she had been doing well and had no other complaints. At the time  of her hospital workup there was no interval embolic disease. Patients neurological examination was non focal and she was told to follow current therapy and that she may need a TEE or loop recorder in the near future. Patient arrived to [facility] ED via EMS for new  left facial droop, left sided sensory loss,left facial droop, dense left hemineglect ,left sided hemiplegia.. Patient was seen as Code Stroke with CT Head revealing hypodensity seen in  right frontal cortex concern for acute infarct.. CTA Head/Neck with acute right M2/MCA posterior division occlusion.She was not a TNK candidate due to time She was  VIR Candidate.Patient underwent  Intervention found to have partial occlusion of 2 branches of 3 vessels at MCA trifurcation and under went mechanical thrombectomy.  Patient w/o any complaints this AM, her speech and vision has improved .MRI Brain w/o contrast reveals moderate size patchy acute ischemic right MCA distribution infarct. There are associated  petechial hemorrhages w/o frank hemorrhagic transformation or significant effect     Vitals:   03/22/22 1315 03/22/22 1330 03/22/22 1345 03/22/22 1400  BP:  136/80  128/70  Pulse: 91 89 88 87  Resp: 20 (!) 21 19 20   Temp:      TempSrc:      SpO2: 96% 100% 93% 98%  Weight:       CBC:  Recent Labs  Lab 03/21/22 1320 03/21/22 1322  WBC 13.8*  --   NEUTROABS 11.8*  --   HGB 12.8 13.9  HCT 39.6 41.0  MCV 86.7  --   PLT 227  --    Basic Metabolic Panel:  Recent Labs  Lab 03/21/22 1320 03/21/22 1322 03/21/22 1826  NA 139 138  --   K 5.1 4.8  --   CL 99 103  --   CO2 22  --   --   GLUCOSE 193* 187*  --   BUN 71* 62*  --   CREATININE 10.25* 11.30*  --   CALCIUM 10.0  --   --   PHOS  --   --  6.0*   Lipid Panel:  Recent Labs  Lab 03/22/22 0243  CHOL 107  TRIG 84  HDL 35*  CHOLHDL 3.1  VLDL 17  LDLCALC 55   HgbA1c: No results for input(s): "HGBA1C" in the last 168 hours. Urine Drug Screen: No results for input(s): "LABOPIA", "COCAINSCRNUR", "LABBENZ", "AMPHETMU", "THCU", "LABBARB" in the last 168 hours.  Alcohol Level  Recent Labs  Lab 03/21/22 1320  ETH <10    IMAGING past 24 hours VAS [place] LOWER EXTREMITY VENOUS (DVT)  Result Date: 03/22/2022  Lower Venous DVT Study Patient Name:  Cindy Osborne  Date of Exam:   03/22/2022 Medical Rec #: [id number]          Accession #:    [id number] Date of Birth: Feb 09, 1966           Patient Gender: F Patient Age:   84 years Exam Location:  [facility] Procedure:      VAS [place] LOWER EXTREMITY VENOUS (DVT) Referring Phys: Cornelius Moras Puneet Masoner --------------------------------------------------------------------------------  Indications: Stroke.  Comparison Study: No previous exam noted. Performing Technologist: Bobetta Lime BS, RVT  Examination Guidelines: A complete evaluation includes B-mode imaging, spectral Doppler, color Doppler, and power Doppler as needed of all accessible portions of each vessel. Bilateral testing is  considered an integral part of a complete examination. Limited examinations for reoccurring indications may be performed as noted. The reflux portion of the exam is performed with the patient in reverse Trendelenburg.  +---------+---------------+---------+-----------+----------+--------------+ RIGHT    CompressibilityPhasicitySpontaneityPropertiesThrombus Aging +---------+---------------+---------+-----------+----------+--------------+ CFV      Full           Yes      Yes                                 +---------+---------------+---------+-----------+----------+--------------+ SFJ      Full                                                        +---------+---------------+---------+-----------+----------+--------------+  FV Prox  Full                                                        +---------+---------------+---------+-----------+----------+--------------+ FV Mid   Full                                                        +---------+---------------+---------+-----------+----------+--------------+ FV DistalFull                                                        +---------+---------------+---------+-----------+----------+--------------+ PFV      Full                                                        +---------+---------------+---------+-----------+----------+--------------+ POP      Full           Yes      Yes                                 +---------+---------------+---------+-----------+----------+--------------+   Right Technical Findings: Not visualized segments include posterior tibial and peroneal veins due to BKA.  +---------+---------------+---------+-----------+----------+--------------+ LEFT     CompressibilityPhasicitySpontaneityPropertiesThrombus Aging +---------+---------------+---------+-----------+----------+--------------+ CFV      Full           Yes      Yes                                  +---------+---------------+---------+-----------+----------+--------------+ SFJ      Full                                                        +---------+---------------+---------+-----------+----------+--------------+ FV Prox  Full                                                        +---------+---------------+---------+-----------+----------+--------------+ FV Mid   Full                                                        +---------+---------------+---------+-----------+----------+--------------+ FV DistalFull                                                        +---------+---------------+---------+-----------+----------+--------------+  PFV      Full                                                        +---------+---------------+---------+-----------+----------+--------------+ POP      Full           Yes      Yes                                 +---------+---------------+---------+-----------+----------+--------------+   Left Technical Findings: Not visualized segments include posterior tibial and posterior tibial veins due to BKA.   Summary: BILATERAL: - No evidence of deep vein thrombosis seen in the lower extremities, bilaterally. -No evidence of popliteal cyst, bilaterally. RIGHT: - Right BKA  LEFT: - Left BKA  *See table(s) above for measurements and observations.    Preliminary    MR BRAIN WO CONTRAST  Result Date: 03/22/2022 CLINICAL DATA:  Follow-up examination for stroke. EXAM: MRI HEAD WITHOUT CONTRAST TECHNIQUE: Multiplanar, multiecho pulse sequences of the brain and surrounding structures were obtained without intravenous contrast. COMPARISON:  Prior CT from 03/21/2022 as well as brain MRI from 01/22/2022. FINDINGS: Brain: Cerebral volume stable, and remains within normal limits. There has been normal expected interval evolution of previously identified right cerebral infarcts since 01/22/2022. Additional chronic infarcts involving the left  parieto-occipital region and left cerebellum noted as well. Mild scattered chronic hemosiderin staining noted about a few of these chronic infarcts. Patchy multifocal restricted diffusion involving the right frontal and parietal lobes, consistent with acute right MCA distribution infarct. Involvement is most pronounced at the deep white matter of the right frontal lobe. Scattered areas of associated petechial hemorrhage, most pronounced at the right precentral gyrus (series 12, image 43) Heidelberg classification 1b: HI2, confluent petechiae, no mass effect. No frank hemorrhagic transformation or significant mass effect. No other acute or subacute ischemia elsewhere within the brain. No other acute intracranial hemorrhage. No mass lesion, midline shift, or mass effect. No hydrocephalus or extra-axial fluid collection. Pituitary gland and suprasellar region within normal limits. Vascular: Major intracranial vascular flow voids are maintained. Skull and upper cervical spine: Craniocervical junction within normal limits. Bone marrow signal intensity diffusely decreased on T1 weighted sequence, nonspecific, but most commonly related to anemia, smoking or obesity. No focal marrow replacing lesion. No scalp soft tissue abnormality. Sinuses/Orbits: Globes and orbital soft tissues within normal limits. Paranasal sinuses are largely clear. Trace right mastoid effusion, of doubtful significance. Other: None. IMPRESSION: 1. Moderate-sized patchy acute ischemic right MCA distribution infarct as above. Associated petechial hemorrhage without frank hemorrhagic transformation or significant mass effect. 2. Few scattered underlying chronic ischemic infarcts as above. No other acute intracranial abnormality. Electronically Signed   By: Jeannine Boga M.D.   On: 03/22/2022 05:59   DG CHEST PORT 1 VIEW  Result Date: 03/21/2022 CLINICAL DATA:  [id number]; encounter for BiPAP use counseling EXAM: PORTABLE CHEST 1 VIEW COMPARISON:   None Available. FINDINGS: Moderate cardiomegaly. Moderately large right pleural effusion. Pulmonary edema. There are likely superimposed atelectasis-infiltrations at the right lower lobe. Moderate thoracic spondylosis. IMPRESSION: Moderate cardiomegaly and pulmonary edema. Right pleural effusion with likely superimposed right basal atelectasis-infiltrations. Electronically Signed   By: Frazier Richards M.D.   On: 03/21/2022 16:18    PHYSICAL EXAM Neuro:  Mental Status: Patient is awake, alert, oriented to person, place, month, year, and situation.She is talkative, pleasant ,cooperative , follows commands. Speech is clear this am Patient is able to give a clear and coherent history.  Cranial Nerves: II: Visual Fields are full. Pupils are equal, round, and reactive to light.   III,IV, VI: EOMI without ptosis or diplopia. Right gaze preference . Able to pass midpoint  V: Facial sensation is decreased on left side VII: Face with left facial droop VIII: Hearing is intact to voice X: Palate is midline and palate elevates symmetrically, phonation intact XI: Shoulder shrug is symmetric. XII: tongue is midline without atrophy or fasciculations.  Motor: Tone is normal. Bulk is normal. Muscle Strength: LUE 2/5  L Grip- 3/5  LLE BKA 4/5  / RUE 5/5 RLE BKA- 5/5 Sensory: Sensation is decreased on left side with light touch. No extinction to DSS prese   Cerebellar: FNF and HKS are intact bilaterally on the right     ASSESSMENT/PLAN Ms. Cindy Osborne is a 56 y.o. female with history of a stroke in 01/2022 due to left M2 occlusion treated with thrombectomy. Now presented with acute onset of left sided hemiplegia ,facial droop, left hemi neglect and left sensory loss. Patient now s/p mechanical thrombectomy of partial 2 branches of 3 vessel @ MCA Bifurcation with neurological improvement  PLAN: - Post-thrombectomy order set to include frequent neuro checks and BP management - Start DAPT ASA 81 mg/  Plavix 75 mg which can be started today after 1600 today - Passed swallowing evaluation and placed on dysphagia diet - Continue pravastatin 40 mg po qday - MRI Completed post procedure  -  Continue Cardiac telemetry, to assess for PAF - PT/OT/Speech (ordered) for discharge /rehab placement - Patient now presented with 2 strokes on bilateral hemisphere. This is suspicious for cardioembolic etiology. She will  need a TEE to r/o thrombus and plan for Loop recorder placement prior to discharge. Cardiology has been contacted/consulted    # ESRD on HD: - Obtaining Nephrology consult - Monitor renal function QD   # DM2 - Sliding scale insulin   # Scattered opacities in the bilateral lung apices on CTA, likely infectious in nature.  - Given nodular appearance, follow-up chest CT is recommended by Radiology. - Small thyroid nodule also noted on CTA   85 minutes spent in the emergent neurological evaluation and management of this critically ill patient. Time spent included discussion of her condition and treatment options with family, in addition to coordination of care with the ED and VIR teams.       Diet   DIET DYS 2 Room service appropriate? Yes; Fluid consistency: Thin   Recent Labs    03/22/22 0413 03/22/22 0842 03/22/22 1156  GLUCAP 102* 76 79    To contact Stroke Continuity provider, please refer to [email or web address] After hours, contact General Neurology

## 2022-03-22 NOTE — Evaluation (Signed)
Clinical/Bedside Swallow Evaluation Patient Details  Name: Cindy Osborne MRN: [id number] Date of Birth: 09-28-1965  Today's Date: 03/22/2022 Time: SLP Start Time (ACUTE ONLY): 0850 SLP Stop Time (ACUTE ONLY): 1700 SLP Time Calculation (min) (ACUTE ONLY): 15 min  Past Medical History:  Past Medical History:  Diagnosis Date   Amputated toe ([place]) 01/31/2012   Right second toe distal phalanx Right great toe   Diabetes mellitus without complication ([place])    History of diabetic ulcer of foot 04/01/2014   Hyperlipidemia    Hypertension    Stroke ([place]) 01/21/2022   Past Surgical History:  Past Surgical History:  Procedure Laterality Date   angioplasty politeal Left 07/2020   avg for dialysis     CHOLECYSTECTOMY     COLONOSCOPY  08/15/2011   cleared for 10 yrs   IR CT HEAD LTD  01/21/2022   IR CT HEAD LTD  03/21/2022   IR PERCUTANEOUS ART THROMBECTOMY/INFUSION INTRACRANIAL INC DIAG ANGIO  01/21/2022   IR PERCUTANEOUS ART THROMBECTOMY/INFUSION INTRACRANIAL INC DIAG ANGIO  03/21/2022   IR US GUIDE VASC ACCESS RIGHT  01/21/2022   IR US GUIDE VASC ACCESS RIGHT  03/21/2022   LEG AMPUTATION BELOW KNEE Right 04/2019   LEG AMPUTATION BELOW KNEE Left 09/08/2020   RADIOLOGY WITH ANESTHESIA N/A 01/21/2022   Procedure: IR WITH ANESTHESIA;  Surgeon: Radiologist, Medication, MD;  Location: [facility];  Service: Radiology;  Laterality: N/A;   TOE AMPUTATION Right 10/15/2018   HPI:  56yo female admitted 03/21/22 with acute onset left facial droop, left side weakness and left hemineglect. Intubated briefly for angiogram. PMH: DM, diabetic foot ulcer, bilateral BKA, ESRD on HD, HLD, HTN, R MCA (01/2022) with R MCA thrombectomy (no residual deficits). MRI =Moderate-sized patchy acute ischemic right MCA distribution    Assessment / Plan / Recommendation  Clinical Impression  Pt seen at bedside for assessment of swallow function and safety, and to identify least restrictive diet. Pt was awake and alert, resting in bed.  Pt presents with left orofacial weakness and mildly dysarthric speech. No deficits after CVA in June 2023. She is missing upper and lower molars. Oral care was completed with suction. Pt was able to assist with oral care after set up. Pt accepted trials of ice chips, thin liquid (via cup and straw), puree, and solid textures. Left anterior leakage was noted with puree, due to impulsivity and left inattention. Pt required cues not to talk with food in her mouth. Graham cracker required liquid wash to moisten for oral clearing. No cough response elicited following any trial. Recommend dys 2 diet with thin liquids, meds per pt preference. She will benefit from full supervision with all PO intake due to left inattention and impulsivity with bite size and rate. Safe swallow precautions posted at [facility]. RN informed. SLP will follow to assess diet tolerance and determine readiness to advance solid textures.  SLP Visit Diagnosis: Dysphagia, unspecified (R13.10)    Aspiration Risk  Mild aspiration risk    Diet Recommendation Dysphagia 2 (Fine chop);Thin liquid   Liquid Administration via: Cup;Straw Medication Administration: Whole meds with liquid Supervision: Patient able to self feed;Staff to assist with self feeding;Full supervision/cueing for compensatory strategies Compensations: Slow rate;Small sips/bites;Minimize environmental distractions Postural Changes: Seated upright at 90 degrees    Other  Recommendations Oral Care Recommendations: Oral care BID Other Recommendations: Have oral suction available    Recommendations for follow up therapy are one component of a multi-disciplinary discharge planning process, led by the attending physician.  Recommendations may be updated based on patient status, additional functional criteria and insurance authorization.  Follow up Recommendations Follow physician's recommendations for discharge plan and follow up therapies      Assistance Recommended at  Discharge Intermittent Supervision/Assistance  Functional Status Assessment Patient has had a recent decline in their functional status and demonstrates the ability to make significant improvements in function in a reasonable and predictable amount of time.  Frequency and Duration min 1 x/week  2 weeks       Prognosis Prognosis for Safe Diet Advancement: Good      Swallow Study   General Date of Onset: 03/21/22 HPI: 56yo female admitted 03/21/22 with acute onset left facial droop, left side weakness and left hemineglect. Intubated briefly for angiogram. PMH: DM, diabetic foot ulcer, bilateral BKA, ESRD on HD, HLD, HTN, R MCA (01/2022) with R MCA thrombectomy (no residual deficits). MRI =Moderate-sized patchy acute ischemic right MCA distribution Type of Study: Bedside Swallow Evaluation Previous Swallow Assessment: none. SLE done in June 2023 Diet Prior to this Study: NPO Temperature Spikes Noted: No Respiratory Status: Nasal cannula History of Recent Intubation: Yes Length of Intubations (days): 1 days Date extubated: 03/21/22 Behavior/Cognition: Alert;Cooperative;Pleasant mood;Distractible;Requires cueing;Impulsive Oral Cavity Assessment: Within Functional Limits Oral Care Completed by SLP: Yes Oral Cavity - Dentition: Missing dentition Vision: Functional for self-feeding Self-Feeding Abilities: Able to feed self;Needs assist;Needs set up Patient Positioning: Upright in bed Baseline Vocal Quality: Normal Volitional Cough: Strong Volitional Swallow: Able to elicit    Oral/Motor/Sensory Function Overall Oral Motor/Sensory Function: Mild impairment Facial ROM: Reduced left Facial Symmetry: Abnormal symmetry left Facial Strength: Reduced left Facial Sensation: Within Functional Limits Lingual ROM: Reduced left Lingual Symmetry: Abnormal symmetry left Lingual Strength: Reduced Lingual Sensation: Within Functional Limits Mandible: Within Functional Limits   Ice Chips Ice chips:  Within functional limits Presentation: Spoon   Thin Liquid Thin Liquid: Within functional limits Presentation: Straw;Cup;Self Fed    Nectar Thick Nectar Thick Liquid: Not tested   Honey Thick Honey Thick Liquid: Not tested   Puree Puree: Impaired Presentation: Self Fed;Spoon Oral Phase Functional Implications: Left anterior spillage   Solid     Solid: Impaired Presentation: Self Fed Oral Phase Impairments: Poor awareness of bolus Oral Phase Functional Implications: Impaired mastication     Patsey Pitstick B. Quentin Ore, [facility], [place] Speech Language Pathologist Office: [phone]  Shonna Chock 03/22/2022,9:36 AM

## 2022-03-22 NOTE — Evaluation (Signed)
Occupational Therapy Evaluation Patient Details Name: Cindy Osborne MRN: [id number] DOB: Jul 29, 1966 Today's Date: 03/22/2022   History of Present Illness Pt is a 56 y.o. female who presented 03/21/22 with acute onset L-sided weakness and hemineglect. Imaging revealed R MCA infarct. PMH includes: Bil BKA, DM, HLD, HTN, recent admission for R MCA CVA s/p thrombectomy June 2023   Clinical Impression   Cindy Osborne was evaluated s/p the above admission list, she is indep at baseline and lives alone. Her sister was present during evaluation and plans to stay with pt and assist as needed at d/c. Overall she require total A +2 to get to EOB, and min G -mod A sitting balance with L lateral lean. Pt has very limited insight into her deficits, especially the L field cut.  Due to deficits listed below, she requires up to total A for LB ADLs and mod-max A for UB ADLs. Anticipate great progression and rehab potential. OT to follow acutely. Recommend d/c to AIR.      Recommendations for follow up therapy are one component of a multi-disciplinary discharge planning process, led by the attending physician.  Recommendations may be updated based on patient status, additional functional criteria and insurance authorization.   Follow Up Recommendations  Acute inpatient rehab (3hours/day)    Assistance Recommended at Discharge Frequent or constant Supervision/Assistance  Patient can return home with the following A little help with walking and/or transfers;A lot of help with bathing/dressing/bathroom;Assistance with cooking/housework;Assist for transportation;Help with stairs or ramp for entrance    Functional Status Assessment  Patient has had a recent decline in their functional status and demonstrates the ability to make significant improvements in function in a reasonable and predictable amount of time.  Equipment Recommendations  None recommended by OT    Recommendations for Other Services Rehab consult      Precautions / Restrictions Precautions Precautions: Fall;Other (comment) Precaution Comments: prior bil BKA (family bringing prosthetics) Restrictions Weight Bearing Restrictions: No      Mobility Bed Mobility Overal bed mobility: Needs Assistance Bed Mobility: Supine to Sit, Sit to Supine     Supine to sit: Total assist, +2 for physical assistance, +2 for safety/equipment, HOB elevated Sit to supine: Total assist, +2 for physical assistance, +2 for safety/equipment, HOB elevated   General bed mobility comments: Pt already laying diagonal in bed with legs towards L side, thus continued transition to sit EOB, but required TAx2 to manage hips and push trunk up to sit as pt unable to reach L bed rail with R hand. TAx2 to manage trunk and legs back to supine, cuing pt to lean laterally onto L elbow, but pt having difficulty placing it.    Transfers                   General transfer comment: deferred, awaiting bil prosthetic devices      Balance Overall balance assessment: Needs assistance Sitting-balance support: Single extremity supported, Feet unsupported Sitting balance-Leahy Scale: Poor Sitting balance - Comments: Pt with L lateral lean, needing cues to correct, min guard-minA for static sitting balance. Postural control: Left lateral lean     Standing balance comment: deferred, awaiting bil prosthetic devices                           ADL either performed or assessed with clinical judgement   ADL Overall ADL's : Needs assistance/impaired Eating/Feeding: Set up;Sitting   Grooming: Set up;Sitting   Upper Body Bathing:  Moderate assistance;Sitting   Lower Body Bathing: Bed level;Maximal assistance   Upper Body Dressing : Moderate assistance;Sitting   Lower Body Dressing: Maximal assistance;Sitting/lateral leans   Toilet Transfer: Total assistance Toilet Transfer Details (indicate cue type and reason): bed level Toileting- Clothing Manipulation  and Hygiene: Total assistance       Functional mobility during ADLs: Total assistance;+2 for physical assistance;+2 for safety/equipment (bed level) General ADL Comments: bilat BKA, no prosthetics this date. Anticipate improved participation with prosthetics     Vision Baseline Vision/History: 0 No visual deficits Vision Assessment?: Vision impaired- to be further tested in functional context;Yes Eye Alignment: Within Functional Limits Ocular Range of Motion: Restricted on the left Alignment/Gaze Preference: Gaze right Tracking/Visual Pursuits: Impaired - to be further tested in functional context Visual Fields: Left visual field deficit Additional Comments: L visual field cut - able to track to the L but unable to sustain attention/gaze to the left     Perception     Praxis      Pertinent Vitals/Pain Pain Assessment Pain Assessment: Faces Faces Pain Scale: Hurts even more Pain Location: L arm, L hip, low back Pain Descriptors / Indicators: Discomfort, Grimacing Pain Intervention(s): Limited activity within patient's tolerance     Hand Dominance Right   Extremity/Trunk Assessment Upper Extremity Assessment Upper Extremity Assessment: RUE deficits/detail;LUE deficits/detail RUE Deficits / Details: WFL RUE Coordination: WNL LUE Deficits / Details: more movemnet distally, able to shrug shoulders but not raise arm, trace activiation noted at biceps with elbow flexion. Pt states sensation is WFL - will benefit from further assessment LUE Coordination: decreased fine motor;decreased gross motor   Lower Extremity Assessment Lower Extremity Assessment: Defer to PT evaluation LLE Deficits / Details: Grossly weakn compared to R, but able to lift against gravity and against some resistance (difficulty following cues to hold contraction against resistance); able to detect touch with accuracy, denied numbness/tingling LLE Sensation: WNL LLE Coordination: decreased gross motor    Cervical / Trunk Assessment Cervical / Trunk Assessment: Normal   Communication Communication Communication: No difficulties   Cognition Arousal/Alertness: Awake/alert Behavior During Therapy: WFL for tasks assessed/performed Overall Cognitive Status: Impaired/Different from baseline Area of Impairment: Attention, Following commands, Safety/judgement, Awareness, Problem solving                   Current Attention Level: Sustained   Following Commands: Follows one step commands with increased time Safety/Judgement: Decreased awareness of safety, Decreased awareness of deficits Awareness: Intellectual Problem Solving: Slow processing, Decreased initiation, Difficulty sequencing, Requires verbal cues, Requires tactile cues General Comments: Pt with poor attention span and low awareness of her deficits. She is aware her L side is weak but unaware of her visual deficits despite education and attention to L visual dificits. Has difficulty problem-solving how to change body positions or correct leaning for improved alignment in bed or on EOB.     General Comments  VSS    Exercises     Shoulder Instructions      Home Living Family/patient expects to be discharged to:: Private residence Living Arrangements: Alone Available Help at Discharge: Family Type of Home: House Home Access: Ramped entrance     Home Layout: One level     Bathroom Shower/Tub: Teacher, early years/pre: Standard     Home Equipment: Conservation officer, nature (2 wheels);Cane - single point;Tub bench;Wheelchair - manual;Grab bars - tub/shower;BSC/3in1;Adaptive equipment Adaptive Equipment: Long-handled sponge;Reacher Additional Comments: having bathroom redone  Lives With: Alone    Prior Functioning/Environment  Prior Level of Function : Independent/Modified Independent;Driving;History of Falls (last six months)             Mobility Comments: uses a RW with her bil prosthetics ADLs Comments: pt  reports full independence        OT Problem List: Decreased strength;Decreased range of motion;Decreased activity tolerance;Impaired balance (sitting and/or standing);Impaired vision/perception;Decreased coordination;Decreased cognition;Decreased safety awareness;Decreased knowledge of precautions;Impaired sensation;Impaired UE functional use;Pain      OT Treatment/Interventions: Self-care/ADL training;Therapeutic exercise;Neuromuscular education;DME and/or AE instruction;Modalities;Therapeutic activities;Patient/family education;Balance training    OT Goals(Current goals can be found in the care plan section) Acute Rehab OT Goals Patient Stated Goal: home OT Goal Formulation: With patient Time For Goal Achievement: 04/05/22 Potential to Achieve Goals: Good ADL Goals Pt Will Perform Grooming: with set-up;sitting Pt Will Perform Upper Body Dressing: with set-up;sitting Pt Will Perform Lower Body Dressing: with mod assist;sitting/lateral leans Pt Will Transfer to Toilet: with mod assist;stand pivot transfer;bedside commode Additional ADL Goal #1: pt will indep locate at least 3 grooming supplies in her left environmetn to assist in grooming task  OT Frequency: Min 2X/week    Co-evaluation PT/OT/SLP Co-Evaluation/Treatment: Yes Reason for Co-Treatment: Complexity of the patient's impairments (multi-system involvement);For patient/therapist safety;To address functional/ADL transfers PT goals addressed during session: Mobility/safety with mobility;Balance OT goals addressed during session: ADL's and self-care      AM-PAC OT "6 Clicks" Daily Activity     Outcome Measure Help from another person eating meals?: A Little Help from another person taking care of personal grooming?: A Little Help from another person toileting, which includes using toliet, bedpan, or urinal?: Total Help from another person bathing (including washing, rinsing, drying)?: A Lot Help from another person to put on  and taking off regular upper body clothing?: A Lot Help from another person to put on and taking off regular lower body clothing?: A Lot 6 Click Score: 13   End of Session Nurse Communication: Mobility status  Activity Tolerance: Patient tolerated treatment well Patient left: in bed;with call bell/phone within reach;with bed alarm set;with family/visitor present  OT Visit Diagnosis: Unsteadiness on feet (R26.81);Other abnormalities of gait and mobility (R26.89);Muscle weakness (generalized) (M62.81);Hemiplegia and hemiparesis;History of falling (Z91.81) Hemiplegia - Right/Left: Left Hemiplegia - dominant/non-dominant: Non-Dominant Hemiplegia - caused by: Cerebral infarction                Time: 7564-3329 OT Time Calculation (min): 24 min Charges:  OT General Charges $OT Visit: 1 Visit OT Evaluation $OT Eval Moderate Complexity: 1 Mod    Jesselyn Rask A Carman Essick 03/22/2022, 12:26 PM

## 2022-03-22 NOTE — Progress Notes (Signed)
Bilateral LE venous duplex study completed. Please see CV Proc for preliminary results.  Elverta Dimiceli BS, RVT 03/22/2022 9:52 AM

## 2022-03-22 NOTE — Progress Notes (Signed)
[facility] Progress Note  Subjective: BP's dropped during HD, only got 1300 cc UF last night. No c/o today, SOB better. Off bipap, on [place] O2.   Vitals:   03/22/22 0400 03/22/22 0500 03/22/22 0600 03/22/22 0700  BP: 128/67 (!) 108/93 118/62 123/72  Pulse: 92 95 93 91  Resp: 16 (!) 25 19 19   Temp: 99 F (37.2 C)     TempSrc: Axillary     SpO2: 98% (!) 89% 96% 96%  Weight:        Exam: Gen [place] O2, no distress No jvd or bruits Chest clear bilat to bases RRR no MRG Abd soft ntnd no mass or ascites +bs obese MS bilat BKA's, no edema Neuro groggy but responds to questions    LUA AVF + bruit    Home meds include - amitriptyline, amlodipine, aspirin, fexofenadine, insluin glargine, metoprolol xl 200, montelukast, pravastatin, sucroferric oxyhydroxide 1 ac tid, prns/ vits/ supps   OP HD: [place] TTS ([facility] [phone])   4h  104.3kg  400/600  2/2.5 bath  Heparin 2000 LUA AVF - mircera 150 ug q2, last 8/5, last Hb 9.7 - calcitriol 0.5 ug tiw - sensipar 90 po tiw   Assessment/Plan: Acute CVA - sp intervention 8/8. Was here in June for similar event. Per stroke team.  Acute resp failure w/ hypoxemia - on bipap, now stable on  O2. Better.  Volume- is well under dry wt and didn't tolerate vol removal w/ HD last night, suspect CXR yesterday may have been poor quality, vs neurogenic pulm edema not related to vol overload.  ESRD - on HD TTS. Had HD here overnight. Next HD tomorrow.  HTN - BP's well controlled, off of clevidipine gtt. Po meds on hold for now.  Anemia of CKD - Hgb 12, pt is dry most likely running Hb up. Esa last given 8/5, no esa needs.  5.   MBD ckd - CCa and phos both slightly high. Cont sensipar, po vdra w/ HD.  6.   Nutrition - Advance to renal diet once clinically stable  Cindy Osborne 03/22/2022, 9:05 AM Recent Labs  Lab 03/21/22 1320 03/21/22 1322 03/21/22 1826  HGB 12.8 13.9  --   ALBUMIN 3.7  --   --   CALCIUM 10.0  --   --   PHOS  --   --   6.0*  CREATININE 10.25* 11.30*  --   K 5.1 4.8  --    Inpatient medications:   stroke: early stages of recovery book   Does not apply Once   Chlorhexidine Gluconate Cloth  6 each Topical Q0600   insulin aspart  0-20 Units Subcutaneous Q4H    sodium chloride Stopped (03/22/22 0400)   clevidipine Stopped (03/21/22 2020)   acetaminophen **OR** acetaminophen (TYLENOL) oral liquid 160 mg/5 mL **OR** acetaminophen, iohexol, senna-docusate

## 2022-03-23 DIAGNOSIS — I480 Paroxysmal atrial fibrillation: Secondary | ICD-10-CM

## 2022-03-23 DIAGNOSIS — I63411 Cerebral infarction due to embolism of right middle cerebral artery: Secondary | ICD-10-CM | POA: Diagnosis not present

## 2022-03-23 DIAGNOSIS — I10 Essential (primary) hypertension: Secondary | ICD-10-CM

## 2022-03-23 DIAGNOSIS — I639 Cerebral infarction, unspecified: Secondary | ICD-10-CM | POA: Diagnosis not present

## 2022-03-23 DIAGNOSIS — N186 End stage renal disease: Secondary | ICD-10-CM | POA: Diagnosis not present

## 2022-03-23 DIAGNOSIS — Z8673 Personal history of transient ischemic attack (TIA), and cerebral infarction without residual deficits: Secondary | ICD-10-CM | POA: Diagnosis not present

## 2022-03-23 LAB — GLUCOSE, CAPILLARY
Glucose-Capillary: 94 mg/dL (ref 70–99)
Glucose-Capillary: 321 mg/dL — ABNORMAL HIGH (ref 70–99)
Glucose-Capillary: 55 mg/dL — ABNORMAL LOW (ref 70–99)
Glucose-Capillary: 69 mg/dL — ABNORMAL LOW (ref 70–99)
Glucose-Capillary: 72 mg/dL (ref 70–99)
Glucose-Capillary: 79 mg/dL (ref 70–99)
Glucose-Capillary: 134 mg/dL — ABNORMAL HIGH (ref 70–99)
Glucose-Capillary: 150 mg/dL — ABNORMAL HIGH (ref 70–99)

## 2022-03-23 LAB — HEPATITIS B SURFACE ANTIBODY, QUANTITATIVE: Hep B S AB Quant (Post): 696.3 m[IU]/mL (ref >9.9)

## 2022-03-23 MED ORDER — DILTIAZEM HCL-DEXTROSE 125-5 MG/125ML-% IV SOLN (PREMIX)
5.0000 mg/h | INTRAVENOUS | Status: DC
  Administered 2022-03-23: 15 mg/h via INTRAVENOUS
  Administered 2022-03-23: 5 mg/h via INTRAVENOUS
  Administered 2022-03-24: 15 mg/h via INTRAVENOUS
  Filled 2022-03-23 (×3): qty 125

## 2022-03-23 MED ORDER — METOPROLOL TARTRATE 5 MG/5ML IV SOLN
5.0000 mg | INTRAVENOUS | Status: AC
  Administered 2022-03-23: 5 mg via INTRAVENOUS

## 2022-03-23 MED ORDER — METOPROLOL TARTRATE 5 MG/5ML IV SOLN
INTRAVENOUS | Status: AC
  Filled 2022-03-23: qty 5

## 2022-03-23 MED ORDER — ONDANSETRON HCL 4 MG/2ML IJ SOLN: 4.0000 mg | Freq: Four times a day (QID) | INTRAMUSCULAR | Status: DC | PRN

## 2022-03-23 MED ORDER — HEPARIN (PORCINE) 25000 UT/250ML-% IV SOLN
1500.0000 [IU]/h | INTRAVENOUS | Status: DC
  Administered 2022-03-23: 1100 [IU]/h via INTRAVENOUS
  Administered 2022-03-24: 1400 [IU]/h via INTRAVENOUS
  Filled 2022-03-23: qty 250

## 2022-03-23 NOTE — Progress Notes (Signed)
Notified nurse, Samer RN, that PIV has been placed. Also, reported that because of the location of the PIVs no PB can be taken on the R arm d/t risk of damaging PIVs. Notified nurse that at this time she has no BP cuff on her arm. Samer RN VU. Fran Lowes, RN VAST

## 2022-03-23 NOTE — Progress Notes (Signed)
ANTICOAGULATION CONSULT NOTE - Initial Consult  Pharmacy Consult for  Heparin infusion Indication: atrial fibrillation  Allergies  Allergen Reactions   Augmentin [Amoxicillin-Pot Clavulanate] Shortness Of Breath   Atorvastatin Other (See Comments)    Back pain   Pioglitazone Other (See Comments)    Edema   Peanut-Containing Drug Products Itching   Rosuvastatin Itching and Other (See Comments)    Back pain   Shellfish-Derived Products Itching and Rash    shrimp   Sulfa Antibiotics Rash and Other (See Comments)    Shedding of skin    Patient Measurements: Height: 5\' 9"  (175.3 cm) (recorded on 03/20/22) Weight: 106.8 kg (235 lb 7.2 oz) IBW/kg (Calculated) : 66.2 Heparin Dosing Weight: 90 kg  Vital Signs: Temp: 97.9 F (36.6 C) (08/10 1320) Temp Source: Oral (08/10 1320) BP: 114/79 (08/10 1527) Pulse Rate: 98 (08/10 1527)  Labs: Recent Labs    03/21/22 1320 03/21/22 1322  HGB 12.8 13.9  HCT 39.6 41.0  PLT 227  --   APTT 29  --   LABPROT 16.2*  --   INR 1.3*  --   CREATININE 10.25* 11.30*    Estimated Creatinine Clearance: 7.2 mL/min (A) (by C-G formula based on SCr of 11.3 mg/dL (H)).   Medical History: Past Medical History:  Diagnosis Date   Amputated toe ([place]) 01/31/2012   Right second toe distal phalanx Right great toe   Diabetes mellitus without complication ([place])    History of diabetic ulcer of foot 04/01/2014   Hyperlipidemia    Hypertension    Stroke ([place]) 01/21/2022    Medications:  Medications Prior to Admission  Medication Sig Dispense Refill Last Dose   amitriptyline (ELAVIL) 25 MG tablet Take 25 mg by mouth at bedtime.      amLODipine (NORVASC) 10 MG tablet Take 10 mg by mouth daily.      aspirin 81 MG chewable tablet Chew 1 tablet (81 mg total) by mouth daily. 90 tablet 0    fexofenadine (ALLEGRA) 180 MG tablet Take 180 mg by mouth daily.      Insulin Glargine (BASAGLAR KWIKPEN [place]) Inject 10-12 Units into the skin daily.      metoprolol  (TOPROL-XL) 200 MG 24 hr tablet Take 1 tablet (200 mg total) by mouth daily. Take with or immediately following a meal. 30 tablet 0    montelukast (SINGULAIR) 10 MG tablet Take 1 tablet (10 mg total) by mouth at bedtime. (Patient taking differently: Take 10 mg by mouth as needed.) 30 tablet 0    pravastatin (PRAVACHOL) 40 MG tablet Take 1 tablet (40 mg total) by mouth daily at 6 PM. 90 tablet 0    sucroferric oxyhydroxide (VELPHORO) 500 MG chewable tablet Chew 500 mg by mouth 3 (three) times daily with meals.      torsemide (DEMADEX) 100 MG tablet Take 100 mg by mouth 2 (two) times daily.       Assessment: 56 y.o female with a hx of hypertension, hyperlipidemia, diabetes mellitus, PVD s/p bilateral BKA and ESRD - on HD TTS.  She presented  on 03/21/22 with acute CVA.  Pharmacy consulted to start IV heparin drip (no bolus) for Atrial fibrillation.   Goal heparin level = 0.3-0.5 per neurologist.  Not on anticoagulation prior to admission.   8/8 CTA- acute R M2/MCA posterior division occlusion 8/9 MR Brain - acute ischemic R MCA distribution infarct, associated petechial hemorrhage w/o frank hemorrhagic transformation or significant mass effect. Few scattered underlying chronic ischemic infarcts  Baseline aPTT = 29 , INR 1.3 , Hgb 13.9, PLTC 227 on 03/21/22.    Goal of Therapy:  Heparin level 0.3-0.5 units/ml Monitor platelets by anticoagulation protocol: Yes   Plan:  Start IV Heparin 1100 units/hr (12 ut/kg/hr) No bolus Check 6 hr HL Daily HL, CBC Monitor for s/sx of bleeding  Thank you for allowing pharmacy to be part of this patients care team.   Nicole Cella, RPh Clinical Pharmacist' 03/23/2022,4:52 PM Please check AMION for all [place] phone numbers After 10:00 PM, call [place]

## 2022-03-23 NOTE — Progress Notes (Signed)
Pt placed on BiPAP through [facility] w/ 4L bleed in. RT will cont to monitor as needed.

## 2022-03-23 NOTE — Progress Notes (Signed)
Speech Language Pathology Treatment: Dysphagia  Cindy Osborne Details Name: Cindy Osborne MRN: [id number] DOB: 1965/10/01 Today's Date: 03/23/2022 Time: [id number] SLP Time Calculation (min) (ACUTE ONLY): 14 min  Assessment / Plan / Recommendation Clinical Impression  Pt was seen for dysphagia treatment. She was alert and cooperative during the session. Pt reported that she exhibited coughing once with eggs this morning and subsequently with thin liquids via straw. Pt's NT reported that pt exhibited coughing with thin liquids which was improved with cues to reduce rate. SLP questions the potential impact of impulsive tendencies on her reported difficulty. Pt tolerated dysphagia 3 solids, dual consistency boluses, and thin liquids via straw without overt s/sx of aspiration. Mastication was [facility], but pieces of peaches and pears were noted to fall out of her mouth during mastication. Pt's current diet will be continued and SLP will continue to follow pt.     HPI HPI: Cindy Osborne admitted 03/21/22 with acute onset left facial droop, left side weakness and left hemineglect. Intubated briefly for angiogram. PMH: DM, diabetic foot ulcer, bilateral BKA, ESRD on HD, HLD, HTN, R MCA (01/2022) with R MCA thrombectomy (no residual deficits). MRI =Moderate-sized patchy acute ischemic right MCA distribution      SLP Plan  Continue with current plan of care      Recommendations for follow up therapy are one component of a multi-disciplinary discharge planning process, led by the attending physician.  Recommendations may be updated based on Cindy Osborne status, additional functional criteria and insurance authorization.    Recommendations  Diet recommendations: Dysphagia 2 (fine chop);Thin liquid Liquids provided via: Cup;Straw Medication Administration: Whole meds with liquid Supervision: Cindy Osborne able to self feed Compensations: Slow rate;Small sips/bites;Minimize environmental distractions Postural Changes and/or  Swallow Maneuvers: Seated upright 90 degrees                Oral Care Recommendations: Oral care BID Follow Up Recommendations: Follow physician's recommendations for discharge plan and follow up therapies Assistance recommended at discharge: Intermittent Supervision/Assistance SLP Visit Diagnosis: Dysphagia, unspecified (R13.10) Plan: Continue with current plan of care          Aide Wojnar I. Hardin Negus, [place], [place] Office number [phone]  Horton Marshall  03/23/2022, 1:46 PM

## 2022-03-23 NOTE — Progress Notes (Signed)
[facility] Progress Note  Subjective: Seen in HD unit. No UF with HD today. Alert, responds appropriately. Comfortable on RA, no complaints other than sore hip.   Vitals:   03/23/22 0329 03/23/22 0718 03/23/22 0817 03/23/22 0832  BP: (!) 142/75 (!) 143/64 (!) 134/56 (!) 160/80  Pulse: 86 93 89 90  Resp: 18 16 18 13   Temp: 98.3 F (36.8 C) 98.9 F (37.2 C) 98.8 F (37.1 C)   TempSrc: Oral Oral Oral   SpO2: 92% 95% 94% 92%  Weight:        Exam: Gen no distress No jvd or bruits Chest clear bilat to bases RRR no MRG Abd soft ntnd no mass or ascites +bs obese MS bilat BKA's, no edema Neuro Alert, responds to questions    LUA AVF + bruit    Home meds include - amitriptyline, amlodipine, aspirin, fexofenadine, insluin glargine, metoprolol xl 200, montelukast, pravastatin, sucroferric oxyhydroxide 1 ac tid, prns/ vits/ supps   OP HD: [facility] ([facility] [phone])   4h  104.3kg  400/600  2/2.5 bath  Heparin 2000 LUA AVF - mircera 150 ug q2, last 8/5, last Hb 9.7 - calcitriol 0.5 ug tiw - sensipar 90 po tiw   Assessment/Plan: Acute CVA - sp intervention 8/8. Was here in June for similar event. Per stroke team.  Acute resp failure w/ hypoxemia -  On RA this am.  Volume- is well under dry wt and didn't tolerate vol removal w/ HD on Tuesday, suspect CXR  may have been poor quality, vs neurogenic pulm edema not related to vol overload.  ESRD - on HD TTS. HD today.  HTN - BP back up this am. Off clevidipine gtt. Po meds on hold for now.  Anemia of CKD - Hgb >12,  pt is dry most likely running Hb up. Esa last given 8/5, no esa needs.  5.   MBD ckd - CCa and phos both slightly high. Cont sensipar, po vdra w/ HD.  6.   Nutrition - Advance to renal diet once clinically stable  Lynnda Child PA-C [facility] 03/23/2022,8:52 AM  Recent Labs  Lab 03/21/22 1320 03/21/22 1322 03/21/22 1826  HGB 12.8 13.9  --   ALBUMIN 3.7  --   --   CALCIUM  10.0  --   --   PHOS  --   --  6.0*  CREATININE 10.25* 11.30*  --   K 5.1 4.8  --     Inpatient medications:  amitriptyline  25 mg Oral QHS   aspirin  81 mg Oral Daily   Chlorhexidine Gluconate Cloth  6 each Topical Q0600   clopidogrel  75 mg Oral Daily   insulin aspart  0-20 Units Subcutaneous Q4H   pravastatin  40 mg Oral q1800     acetaminophen **OR** acetaminophen (TYLENOL) oral liquid 160 mg/5 mL **OR** acetaminophen, iohexol, labetalol, senna-docusate

## 2022-03-23 NOTE — Progress Notes (Addendum)
STROKE TEAM PROGRESS NOTE   ATTENDING NOTE: I reviewed above note and agree with the assessment and plan. Pt was seen and examined.   Patient seen in dialysis unit.  Patient reported nauseous during the dialysis.  BP stable no hypotension.  Telemetry showed tachycardia but still regular.  However, after HD and patient back to unit, patient was found to have A-fib RVR, with heart rate 160s.  Patient seems to have diaphoresis and continue complaining of nauseous.  Give Zofran.  1 dose of metoprolol IV 5 mg and then put on Cardizem IV.  Cardiology consulted, agree with treatment.  Also recommend anticoagulation.  Put on heparin IV, if tolerating well, may consider transition to [place] tomorrow.  Around 4 PM, patient heart rate reversed to normal sinus rhythm heart rate 90s.  Continue Cardizem and heparin IV.  DC aspirin Plavix, continue statin.  Appreciate cardiology assistance.  No need for TEE and loop recorder at this time.  Will cancel.  For detailed assessment and plan, please refer to above/below as I have made changes wherever appropriate.   Cindy Hawking, MD PhD Stroke Neurology 03/23/2022 6:31 PM  Patient has developed A-fib RVR, and I consulted cardiology. I discussed with Cardiology team. I spent extensive time with the patient, more than 50% of which was spent in counseling and coordination of care, reviewing test results, images and medication, and discussing the diagnosis, treatment plan and potential prognosis. This patient's care requiresreview of multiple databases, neurological assessment, discussion with family, other specialists and medical decision making of high complexity.      INTERVAL HISTORY   Cindy Osborne is an 56 y.o. female with a PMHx of DM, diabetic foot ulcer, bilateral BKA, ESRD on HD, HLD, HTN and right MCA stroke in June of this year who presents as a Code Stroke via EMS with acute onset of left facial droop, left sided weakness and left hemineglect. At her  admission in June for acute stroke she had undergone right MCA thrombectomy with small residual foci of restricted diffusion on MRI following the procedure.   Patient was discharged home on ASA 81 mg /Plavix 75 mg x 21 days and then ASA only. Patient was suppose to have outpatient clinic follow up for an TEE and Loop Recorder. Patient was seen for her stroke follow up on 03/20/22 with Dr Bonnita Levan. She stated that she had been doing well and had no other complaints. At the time  of her hospital workup there was no interval embolic disease.  Patients neurological examination was non focal and she was told to follow current therapy and that she may need a TEE or loop recorder in the near future. Patient arrived to [facility] ED via EMS for new  left facial droop, left sided sensory loss,left facial droop, dense left hemineglect ,left sided hemiplegia..  Patient was seen as Code Stroke with CT Head revealing hypodensity seen in right frontal cortex concern for acute infarct.. CTA Head/Neck with acute right M2/MCA posterior division occlusion.She was not a TNK candidate due to time She was  VIR Candidate.Patient underwent  Intervention found to have partial occlusion of 2 branches of 3 vessels at MCA trifurcation and under went mechanical thrombectomy.  Patient was scheduled for  loop recorder / TEE tomorrow after she completed dialysis today.She returned from dialysis and started to c/o SOB and palpitation. She was found to be diaphoretic and telemetry revealed Atrial Fibrillation w/ RVR. She was given IV metoprolol and Cardizem gtt.,now with  controlled rate  Patient denies any headaches and her speech and vision has improved .Denies any swallowing difficulty, dizziness, syncope or N/V.   Patient has been evaluated by Cardiology Team     Vitals:   03/23/22 1414 03/23/22 1440 03/23/22 1521 03/23/22 1527  BP: 133/80 112/84 128/77 114/79  Pulse: (!) 144 (!) 130 (!) 130 98  Resp:    16  Temp:      TempSrc:       SpO2:    100%  Weight:       CBC:  Recent Labs  Lab 03/21/22 1320 03/21/22 1322  WBC 13.8*  --   NEUTROABS 11.8*  --   HGB 12.8 13.9  HCT 39.6 41.0  MCV 86.7  --   PLT 227  --    Basic Metabolic Panel:  Recent Labs  Lab 03/21/22 1320 03/21/22 1322 03/21/22 1826  NA 139 138  --   K 5.1 4.8  --   CL 99 103  --   CO2 22  --   --   GLUCOSE 193* 187*  --   BUN 71* 62*  --   CREATININE 10.25* 11.30*  --   CALCIUM 10.0  --   --   PHOS  --   --  6.0*   Lipid Panel:  Recent Labs  Lab 03/22/22 0243  CHOL 107  TRIG 84  HDL 35*  CHOLHDL 3.1  VLDL 17  LDLCALC 55   HgbA1c: No results for input(s): "HGBA1C" in the last 168 hours. Urine Drug Screen: No results for input(s): "LABOPIA", "COCAINSCRNUR", "LABBENZ", "AMPHETMU", "THCU", "LABBARB" in the last 168 hours.  Alcohol Level  Recent Labs  Lab 03/21/22 1320  ETH <10    IMAGING past 24 hours No results found.  PHYSICAL EXAM Neuro: Mental Status: Patient is awake, alert, oriented to person, place, month, year, and situation.She is talkative, pleasant ,cooperative , follows commands. Speech is clear this am Patient is able to give a clear and coherent history.  Cranial Nerves: II: Visual Fields are full. Pupils are equal, round, and reactive to light.   III,IV, VI: EOMI without ptosis or diplopia.Able to pass midpoint  V: Facial sensation is  slightlydecreased on left side VII: Face with left facial droop VIII: Hearing is intact to voice X: Palate is midline and palate elevates symmetrically, phonation intact XI: Shoulder shrug is symmetric. XII: tongue is midline without atrophy or fasciculations.  Motor: Tone is normal. Bulk is normal. Muscle Strength: LUE 2/5  L Grip- 3/5  LLE BKA 4/5  / RUE 5/5 RLE BKA- 5/5 Sensory: Sensation is decreased on left side with light touch. No extinction to DSS prese   Cerebellar: FNF and HKS are intact on the right     ASSESSMENT/PLAN Ms. Cindy Osborne is a 56  y.o. female with history of a stroke in 01/2022 due to left M2 occlusion treated with thrombectomy. Now presented with acute onset of left sided hemiplegia ,facial droop, left hemi neglect and left sensory loss. Patient now s/p mechanical thrombectomy of partial 2 branches of 3 vessel @ MCA Bifurcation with neurological improvement Patient now with new onset atrial fibrillation  with rapid ventricular rate which has been documented   PLAN: - Neuro checks q 4 hours  -Anticoagulation Therapy: new onset Afib documented today. No need for scheduled loop recorder and TEE that was scheduled for tomorrow. Start Heparin Gtt  today and if tolerating will transition to [place]. -Continue DAPT (ASA/Plavix)  -Statin Therapy: Continue pravastatin 40 mg  po qday - Continue PT/OT/Speech for discharge /rehab placement  # New onset Atrial Fibrillation with RVR -Now asymptomatic on Cardizem gtt -CHA2DS2VASc Score=6 (female 1/HTN 1/ Stroke/Thromboembolism 2/Vascular disease 1/ DM 1) -Stroke risk factor @ 13.6% risk of Stroke /TIA/ Systemic Embolism -Initiating Anticoagulation: Started heparin gtt and transition to [place]    # ESRD on HD: - Obtaining Nephrology consult - Monitor renal function QD   # DM2 - Sliding scale insulin   # Scattered opacities in the bilateral lung apices on CTA, likely infectious in nature.  - Given nodular appearance, follow-up chest CT is recommended by Radiology. - Small thyroid nodule also noted on CTA   85 minutes spent in the emergent neurological evaluation and management of this critically ill patient. Time spent included discussion of her condition and treatment options with family, in addition to coordination of care with the ED and VIR teams.       Diet   DIET DYS 2 Room service appropriate? Yes; Fluid consistency: Thin   Recent Labs    03/23/22 1155 03/23/22 1207 03/23/22 1303  GLUCAP 55* 72 79    To contact Stroke Continuity provider, please refer to  [email or web address] After hours, contact General Neurology

## 2022-03-23 NOTE — Progress Notes (Signed)
Pt receives out-pt HD at [facility] on TTS. Pt arrives at 10:55 for 1:15 chair time. Will assist as needed.   Melven Sartorius Renal Navigator [phone]

## 2022-03-23 NOTE — Progress Notes (Signed)
Received patient in bed to unit.  Alert and oriented.  Informed consent signed and in  chart.   Treatment initiated: 0722 Treatment completed: 1106  Patient tolerated well. Signed off ealry Transported back to the room  alert, without acute distress.  Hand-off given to patient's nurse.   Access used: AVgraft Access issues: none  Total UF removed: 100 Medication(s) given: n/a Post HD VS: 136/84,93,14,100,99.2 Post HD weight: 106.8   Donah [facility]

## 2022-03-23 NOTE — Progress Notes (Signed)
Patient transferred from 4N ICU. VSS. C/o hip pain, relieved with repostioning. RT consult for CPAP (per pt, she wear cpap at home).Patient is currently on 4L [place] but does not uses at home and shares that she feels SOB without the oxygen. Paged MD for amitriptyline per request. No other distress or concerns voice. Will continue to monitor.

## 2022-03-23 NOTE — Progress Notes (Addendum)
Will cancel TEE as he has documented afib. Mutual agreement between Dr. Erlinda Hong and Dr. Stanford Breed.

## 2022-03-23 NOTE — Progress Notes (Signed)
   03/23/22 1320  Assess: MEWS Score  Temp 97.9 F (36.6 C)  BP 103/79  MAP (mmHg) 89  ECG Heart Rate (!) 150  Resp (!) 30  SpO2 100 %  O2 Device Nasal Cannula  O2 Flow Rate (L/min) 2 L/min  Assess: MEWS Score  MEWS Temp 0  MEWS Systolic 0  MEWS Pulse 3  MEWS RR 2  MEWS LOC 0  MEWS Score 5  MEWS Score Color Red  Assess: if the MEWS score is Yellow or Red  Were vital signs taken at a resting state? Yes  Focused Assessment Change from prior assessment (see assessment flowsheet)  Does the patient meet 2 or more of the SIRS criteria? No  MEWS guidelines implemented *See Row Information* Yes  Treat  MEWS Interventions Escalated (See documentation below)  Pain Scale 0-10  Pain Score 0  Take Vital Signs  Increase Vital Sign Frequency  Red: Q 1hr X 4 then Q 4hr X 4, if remains red, continue Q 4hrs  Escalate  MEWS: Escalate Red: discuss with charge nurse/RN and provider, consider discussing with RRT  Notify: Charge Nurse/RN  Name of Charge Nurse/RN Notified Janique RN  Date Charge Nurse/RN Notified 03/23/22  Time Charge Nurse/RN Notified 1300  Notify: Provider  Provider Name/Title Dr Erlinda Hong  Date Provider Notified 03/23/22  Time Provider Notified 1300  Method of Notification Page  Notification Reason Change in status  Provider response See new orders  Date of Provider Response 03/23/22  Time of Provider Response 1305  Notify: Rapid Response  Name of Rapid Response RN Notified Casimer Bilis, RN  Date Rapid Response Notified 03/23/22  Time Rapid Response Notified 1230  Assess: SIRS CRITERIA  SIRS Temperature  0  SIRS Pulse 1  SIRS Respirations  1  SIRS WBC 1  SIRS Score Sum  3

## 2022-03-23 NOTE — Progress Notes (Signed)
PT Cancellation Note  Patient Details Name: Cindy Osborne MRN: [id number] DOB: 08/30/1965   Cancelled Treatment:    Reason Eval/Treat Not Completed: Patient at procedure or test/unavailable  Patient in hemodialysis.   Arby Barrette, PT Acute Rehabilitation Services  Office [phone]  Rexanne Mano 03/23/2022, 9:50 AM

## 2022-03-23 NOTE — Consult Note (Addendum)
Cardiology Consultation:   Patient ID: Montez Stryker MRN: [id number]; DOB: 06/22/1966  Admit date: 03/21/2022 Date of Consult: 03/23/2022  PCP:  Glean Hess, MD   [facility] Providers Cardiologist:  New   Patient Profile:   Annayah Worthley is a 56 y.o. female with a hx of hypertension, hyperlipidemia, diabetes mellitus, PVD s/p bilateral BKA and end-stage renal disease on hemodialysis who is being seen 03/23/2022 for the evaluation of atrial fibrillation with rapid ventricular rate at the request of Dr.Xu.  Admitted 01/2022 for Acute right MCA ischemic infarct likely secondary due to right M1 occlusion s/p TNK and mechanical thrombectomy with TICI 3 revascularization.  Cardiogram with LV function of 55 to 60%, moderate LVH, mild MR. No TEE performed.   History of Present Illness:   Ms. Leitz presented 8/8 with code stroke due to acute onset left facial droop, weakness and left hemineglect. Patient underwent Successful mechanical thrombectomy for high-grade partial occlusion of the proximal M2 divisions (superior and inferior) of the right MCA using the stentriever technique x1.  Placed on Aspirin and Plavix.   MR of brain 03/22/22 1. Moderate-sized patchy acute ischemic right MCA distribution infarct as above. Associated petechial hemorrhage without frank hemorrhagic transformation or significant mass effect. 2. Few scattered underlying chronic ischemic infarcts as above. No other acute intracranial abnormality.  Patient had hypoglycemic event this morning.  This afternoon she went into atrial fibrillation with rapid ventricular rate. Given IV metoprolol 25m x 1 and started on cardizem drip. Cardiology is asked for further evaluation.   She did not felt palpitation with elevated heart rate.  No chest pain or shortness of breath.  She felt diaphoresis.  She uses cane for ambulation at home.  Also has wheelchair. No syncope.    Past Medical History:  Diagnosis Date    [place] 01/[place]2012   Right second toe distal phalanx Right great toe   Diabetes mellitus without complication ([facility]    History of diabetic ulcer of foot 04/01/2014   Hyperlipidemia    Hypertension    [place]    Past Surgical History:  Procedure Laterality Date   angioplasty politeal Left 07/2020   avg for dialysis     CHOLECYSTECTOMY     COLONOSCOPY  08/15/2011   cleared for 10 yrs   IR CT HEAD LTD  01/21/2022   IR CT HEAD LTD  03/21/2022   IR PERCUTANEOUS ART THROMBECTOMY/INFUSION INTRACRANIAL INC DIAG ANGIO  01/21/2022   IR PERCUTANEOUS ART THROMBECTOMY/INFUSION INTRACRANIAL INC DIAG ANGIO  03/21/2022   IR [place] VASC ACCESS RIGHT  01/21/2022   IR [place] VASC ACCESS RIGHT  03/21/2022   LEG AMPUTATION BELOW KNEE Right 04/2019   LEG AMPUTATION BELOW KNEE Left 09/08/2020   RADIOLOGY WITH ANESTHESIA N/A 01/21/2022   Procedure: IR WITH ANESTHESIA;  Surgeon: Radiologist, Medication, MD;  [place] [facility]  Service: Radiology;  Laterality: N/A;   RADIOLOGY WITH ANESTHESIA N/A 03/21/2022   Procedure: IR WITH ANESTHESIA;  Surgeon: Radiologist, Medication, MD;  [place] [facility]  Service: Radiology;  Laterality: N/A;   TOE AMPUTATION Right 10/15/2018     Inpatient Medications: Scheduled Meds:  amitriptyline  25 mg Oral QHS   aspirin  81 mg Oral Daily   Chlorhexidine Gluconate Cloth  6 each Topical Q0600   clopidogrel  75 mg Oral Daily   insulin aspart  0-20 Units Subcutaneous Q4H   metoprolol tartrate       pravastatin  40 mg Oral q1800  Continuous Infusions:  diltiazem (CARDIZEM) infusion 10 mg/hr (03/23/22 1415)   PRN Meds: acetaminophen **OR** acetaminophen (TYLENOL) oral liquid 160 mg/5 mL **OR** acetaminophen, iohexol, labetalol, metoprolol tartrate, ondansetron (ZOFRAN) IV, senna-docusate  Allergies:    Allergies  Allergen Reactions   Augmentin [Amoxicillin-Pot Clavulanate] Shortness Of Breath   Atorvastatin Other (See Comments)    Back pain    Pioglitazone Other (See Comments)    Edema   Peanut-Containing Drug Products Itching   Rosuvastatin Itching and Other (See Comments)    Back pain   Shellfish-Derived Products Itching and Rash    shrimp   Sulfa Antibiotics Rash and Other (See Comments)    Shedding of skin    Social History:   Social History   Socioeconomic History   Marital status: Married    Spouse name: Not on file   Number of children: Not on file   Years of education: Not on file   Highest education level: Not on file  Occupational History   Not on file  Tobacco Use   Smoking status: Never   Smokeless tobacco: Never  Vaping Use   Vaping Use: Never used  Substance and Sexual Activity   Alcohol use: Yes   Drug use: Never   Sexual activity: Yes    Partners: Male  Other Topics Concern   Not on file  Social History Narrative   Not on file   Social Determinants of Health   Financial Resource Strain: Low Risk  (02/06/2022)   Overall Financial Resource Strain (CARDIA)    Difficulty of Paying Living Expenses: Not hard at all  Food Insecurity: No Food Insecurity (02/06/2022)   Hunger Vital Sign    Worried About Running Out of Food in the Last Year: Never true    Ran Out of Food in the Last Year: Never true  Transportation Needs: No Transportation Needs (02/06/2022)   PRAPARE - Hydrologist (Medical): No    Lack of Transportation (Non-Medical): No  Physical Activity: Not on file  Stress: Not on file  Social Connections: Not on file  Intimate Partner Violence: Not At Risk (02/06/2022)   Humiliation, Afraid, Rape, and Kick questionnaire    Fear of Current or Ex-Partner: No    Emotionally Abused: No    Physically Abused: No    Sexually Abused: No    Family History:   Family History  Problem Relation Age of Onset   Diabetes Mother    Diabetes Sister      ROS:  Please see the history of present illness.  All other ROS reviewed and negative.     Physical Exam/Data:    Vitals:   03/23/22 1320 03/23/22 1324 03/23/22 1345 03/23/22 1414  BP: 103/79 99/66 124/86 133/80  Pulse:   (!) 150 (!) 144  Resp: (!) 30     Temp: 97.9 F (36.6 C)     TempSrc: Oral     SpO2: 100% 100%    Weight:        Intake/Output Summary (Last 24 hours) at 03/23/2022 1438 Last data filed at 03/23/2022 1106 Gross per 24 hour  Intake 120 ml  Output 100 ml  Net 20 ml      03/23/2022   11:06 AM 03/22/2022    2:15 AM 03/21/2022    9:50 PM  Last 3 Weights  Weight (lbs) 235 lb 7.2 oz 223 lb 15.8 oz 229 lb 11.5 oz  Weight (kg) 106.8 kg 101.6 kg 104.2 kg  Body mass index is 34.77 kg/m.  General:  Well nourished, well developed, in no acute distress HEENT: normal Neck: no JVD Vascular: No carotid bruits; Distal pulses 2+ bilaterally Cardiac:  normal S1, S2; irregularly irregular tachycardic; no murmur  Lungs:  clear to auscultation bilaterally, no wheezing, rhonchi or rales  Abd: soft, nontender, no hepatomegaly  Ext: Bilateral below-knee imitation Musculoskeletal:  No deformities, BUE and BLE strength normal and equal Skin: warm and dry  Neuro: Left-sided weakness, left facial droop Psych:  Normal affect   EKG:  The EKG was personally reviewed and demonstrates: Atrial fibrillation, LVH, ST changes Telemetry:  Telemetry was personally reviewed and demonstrates: Atrial fibrillation at heart rate of 140-150  Relevant CV Studies: Echo 01/22/22 1. Left ventricular ejection fraction, by estimation, is 55 to 60%. The  left ventricle has normal function. The left ventricle has no regional  wall motion abnormalities. The left ventricular internal cavity size was  mildly dilated. There is moderate  concentric left ventricular hypertrophy. Left ventricular diastolic  parameters are consistent with Grade II diastolic dysfunction  (pseudonormalization). Elevated left ventricular end-diastolic pressure.   2. Right ventricular systolic function is normal. The right ventricular   size is mildly enlarged. Tricuspid regurgitation signal is inadequate for  assessing PA pressure.   3. Left atrial size was moderately dilated.   4. The mitral valve is abnormal. Mild to moderate mitral valve  regurgitation. No evidence of mitral stenosis. Moderate to severe mitral  annular calcification.   5. The aortic valve is tricuspid. There is moderate calcification of the  aortic valve. There is mild thickening of the aortic valve. Aortic valve  regurgitation is not visualized. Mild aortic valve stenosis.   6. The inferior vena cava is normal in size with greater than 50%  respiratory variability, suggesting right atrial pressure of 3 mmHg.   Comparison(s): Prior images unable to be directly viewed, comparison made  by report only. [facility] echo 10/31/21: EF 55%, moderate LVH, mild MR, trivial  TR, RVSP 54 mmHg.   Laboratory Data:  High Sensitivity Troponin:  No results for input(s): "TROPONINIHS" in the last 720 hours.   Chemistry Recent Labs  Lab 03/21/22 1320 03/21/22 1322  NA 139 138  K 5.1 4.8  CL 99 103  CO2 22  --   GLUCOSE 193* 187*  BUN 71* 62*  CREATININE 10.25* 11.30*  CALCIUM 10.0  --   GFRNONAA 4*  --   ANIONGAP 18*  --     Recent Labs  Lab 03/21/22 1320  PROT 8.4*  ALBUMIN 3.7  AST 44*  ALT 18  ALKPHOS 151*  BILITOT 0.9   Lipids  Recent Labs  Lab 03/22/22 0243  CHOL 107  TRIG 84  HDL 35*  LDLCALC 55  CHOLHDL 3.1    Hematology Recent Labs  Lab 03/21/22 1320 03/21/22 1322  WBC 13.8*  --   RBC 4.57  --   HGB 12.8 13.9  HCT 39.6 41.0  MCV 86.7  --   MCH 28.0  --   MCHC 32.3  --   RDW 17.4*  --   PLT 227  --    Radiology/Studies:  VAS [place] LOWER EXTREMITY VENOUS (DVT)  Result Date: 03/22/2022  Lower Venous DVT Study Patient Name:  KENSLI BOWLEY  Date of Exam:   03/22/2022 Medical Rec #: [id number]          Accession #:    [id number] Date of Birth: Nov 21, 1965  Patient Gender: F Patient Age:   28 years Exam Location:  [facility] Procedure:      VAS [place] LOWER EXTREMITY VENOUS (DVT) Referring Phys: Cornelius Moras XU --------------------------------------------------------------------------------  Indications: Stroke.  Comparison Study: No previous exam noted. Performing Technologist: Bobetta Lime BS, RVT  Examination Guidelines: A complete evaluation includes B-mode imaging, spectral Doppler, color Doppler, and power Doppler as needed of all accessible portions of each vessel. Bilateral testing is considered an integral part of a complete examination. Limited examinations for reoccurring indications may be performed as noted. The reflux portion of the exam is performed with the patient in reverse Trendelenburg.  +---------+---------------+---------+-----------+----------+--------------+ RIGHT    CompressibilityPhasicitySpontaneityPropertiesThrombus Aging +---------+---------------+---------+-----------+----------+--------------+ CFV      Full           Yes      Yes                                 +---------+---------------+---------+-----------+----------+--------------+ SFJ      Full                                                        +---------+---------------+---------+-----------+----------+--------------+ FV Prox  Full                                                        +---------+---------------+---------+-----------+----------+--------------+ FV Mid   Full                                                        +---------+---------------+---------+-----------+----------+--------------+ FV DistalFull                                                        +---------+---------------+---------+-----------+----------+--------------+ PFV      Full                                                        +---------+---------------+---------+-----------+----------+--------------+ POP      Full           Yes      Yes                                  +---------+---------------+---------+-----------+----------+--------------+   Right Technical Findings: Not visualized segments include posterior tibial and peroneal veins due to BKA.  +---------+---------------+---------+-----------+----------+--------------+ LEFT     CompressibilityPhasicitySpontaneityPropertiesThrombus Aging +---------+---------------+---------+-----------+----------+--------------+ CFV      Full           Yes      Yes                                 +---------+---------------+---------+-----------+----------+--------------+  SFJ      Full                                                        +---------+---------------+---------+-----------+----------+--------------+ FV Prox  Full                                                        +---------+---------------+---------+-----------+----------+--------------+ FV Mid   Full                                                        +---------+---------------+---------+-----------+----------+--------------+ FV DistalFull                                                        +---------+---------------+---------+-----------+----------+--------------+ PFV      Full                                                        +---------+---------------+---------+-----------+----------+--------------+ POP      Full           Yes      Yes                                 +---------+---------------+---------+-----------+----------+--------------+   Left Technical Findings: Not visualized segments include posterior tibial and posterior tibial veins due to BKA.   Summary: BILATERAL: - No evidence of deep vein thrombosis seen in the lower extremities, bilaterally. -No evidence of popliteal cyst, bilaterally. RIGHT: - Right BKA  LEFT: - Left BKA  *See table(s) above for measurements and observations. Electronically signed by Harold Barban MD on 03/22/2022 at 11:21:57 PM.    Final    MR BRAIN WO CONTRAST  Result  Date: 03/22/2022 CLINICAL DATA:  Follow-up examination for stroke. EXAM: MRI HEAD WITHOUT CONTRAST TECHNIQUE: Multiplanar, multiecho pulse sequences of the brain and surrounding structures were obtained without intravenous contrast. COMPARISON:  Prior CT from 03/21/2022 as well as brain MRI from 01/22/2022. FINDINGS: Brain: Cerebral volume stable, and remains within normal limits. There has been normal expected interval evolution of previously identified right cerebral infarcts since 01/22/2022. Additional chronic infarcts involving the left parieto-occipital region and left cerebellum noted as well. Mild scattered chronic hemosiderin staining noted about a few of these chronic infarcts. Patchy multifocal restricted diffusion involving the right frontal and parietal lobes, consistent with acute right MCA distribution infarct. Involvement is most pronounced at the deep white matter of the right frontal lobe. Scattered areas of associated petechial hemorrhage, most pronounced at the right precentral gyrus (series 12, image 43) Heidelberg classification 1b: HI2, confluent petechiae, no mass effect. No frank  hemorrhagic transformation or significant mass effect. No other acute or subacute ischemia elsewhere within the brain. No other acute intracranial hemorrhage. No mass lesion, midline shift, or mass effect. No hydrocephalus or extra-axial fluid collection. Pituitary gland and suprasellar region within normal limits. Vascular: Major intracranial vascular flow voids are maintained. Skull and upper cervical spine: Craniocervical junction within normal limits. Bone marrow signal intensity diffusely decreased on T1 weighted sequence, nonspecific, but most commonly related to anemia, smoking or obesity. No focal marrow replacing lesion. No scalp soft tissue abnormality. Sinuses/Orbits: Globes and orbital soft tissues within normal limits. Paranasal sinuses are largely clear. Trace right mastoid effusion, of doubtful  significance. Other: None. IMPRESSION: 1. Moderate-sized patchy acute ischemic right MCA distribution infarct as above. Associated petechial hemorrhage without frank hemorrhagic transformation or significant mass effect. 2. Few scattered underlying chronic ischemic infarcts as above. No other acute intracranial abnormality. Electronically Signed   By: Jeannine Boga M.D.   On: 03/22/2022 05:59   IR US Guide Vasc Access Right  Result Date: 03/21/2022 INDICATION: Right MCA syndrome; right common femoral arterial access under ultrasound guidance EXAM: ULTRASOUND-GUIDED VASCULAR ACCESS DIAGNOSTIC CEREBRAL ANGIOGRAM MECHANICAL THROMBECTOMY FLAT PANEL HEAD CT COMPARISON:  None Available. MEDICATIONS: No antibiotic was administered. ANESTHESIA/SEDATION: General anesthesia TECHNIQUE: Real-time ultrasound guidance was utilized for vascular access including the acquisition of permanent ultrasound image documenting patency of the accessed right common femoral artery. COMPLICATIONS: None immediate. FINDINGS: Common femoral artery was patent PROCEDURE: Mechanical thrombectomy was performed by stentriever technique for high-grade partial occlusion of the proximal M2/right MCA branches with TICI 3 results IMPRESSION: Ultrasound-guided access into the right common femoral artery. Mechanical thrombectomy by stentriever technique for high-grade partial occlusion of the proximal M2 superior and inferior divisions of the right MCA with TICI 3 results PLAN: Admission to ICU Blood pressure goal of 120-140 mm Hg Please refer to XA IR percutaneous artery thrombectomy/infusion for detailed report. Electronically Signed   By: Frazier Richards M.D.   On: 03/21/2022 17:17   DG CHEST PORT 1 VIEW  Result Date: 03/21/2022 CLINICAL DATA:  [id number]; encounter for BiPAP use counseling EXAM: PORTABLE CHEST 1 VIEW COMPARISON:  None Available. FINDINGS: Moderate cardiomegaly. Moderately large right pleural effusion. Pulmonary edema. There are  likely superimposed atelectasis-infiltrations at the right lower lobe. Moderate thoracic spondylosis. IMPRESSION: Moderate cardiomegaly and pulmonary edema. Right pleural effusion with likely superimposed right basal atelectasis-infiltrations. Electronically Signed   By: Frazier Richards M.D.   On: 03/21/2022 16:18   IR PERCUTANEOUS ART THROMBECTOMY/INFUSION INTRACRANIAL INC DIAG ANGIO  Result Date: 03/21/2022 INDICATION: 56 year old female with multiple medical issues with history of hypertension, hyperlipidemia, diabetes mellitus, previous stroke, status post right M1 thrombectomy on 01/21/2022, presented with right MCA syndrome. EXAM: ULTRASOUND-GUIDED VASCULAR ACCESS DIAGNOSTIC CEREBRAL ANGIOGRAM MECHANICAL THROMBECTOMY FLAT PANEL HEAD CT COMPARISON:  None Available. MEDICATIONS: Ancef 2 g IV. The antibiotic was administered within 1 hour of the procedure Performing surgeon: Dr. Frazier Richards Assistant: Dr. Karenann Cai ANESTHESIA/SEDATION: The procedure was performed under general anesthesia. CONTRAST:  50 mL of Omnipaque 300 milligram/mL FLUOROSCOPY: Fluoro time: 16.6 minutes Radiation Exposure Index (as provided by the fluoroscopic device): 469.6 mGy Kerma COMPLICATIONS: SIR Level A - No therapy, no consequence. TECHNIQUE: Informed phone consent was obtained from the patient's niece after a thorough discussion of the procedural risks, benefits and alternatives. All questions were addressed. Maximal Sterile Barrier Technique was utilized including caps, mask, sterile gowns, sterile gloves, sterile drape, hand hygiene and skin antiseptic. A timeout was performed prior to  the initiation of the procedure. The right groin was prepped and draped in the usual sterile fashion. Using a micropuncture kit and the modified Seldinger technique, access was gained to the right common femoral artery and an 8 [place] by 25 cm sheath was placed. Real-time ultrasound guidance was utilized for vascular access including the  acquisition of a permanent ultrasound image documenting patency of the accessed vessel. Under fluoroscopy, a Zoom 88 guide catheter was navigated over a 5 [place] neuron select Berenstein catheter and a 0.035" Terumo Glidewire into the aortic arch. The catheter was placed into the right common carotid artery and then advanced into the right internal carotid artery under roadmap guidance. The diagnostic catheter was removed. Frontal and lateral angiograms of the head were obtained. FINDINGS: 1. Normal caliber of the right common femoral artery, adequate for vascular access. 2. High-grade partial occlusion of the proximal M2/MCA (superior and inferior divisions). 3. Delayed flow into the distal branches of the MCA. PROCEDURE: Using biplane roadmap, a zoom 055 aspiration catheter was navigated over an Aristotle 24 microguidewire into the cavernous segment of the right ICA and then the Aristotle 024 subtle wire was navigated into the superior division of the right MCA. However, the aspiration catheter could not be advanced to the site of the high-grade partial occlusion. Next, phenom 21 and synchro 14 microwire were selected and micro catheterization of the M3 segment of the superior division of the right MCA was performed. Subsequently, 3 mm x 20 mm solitaire X stent was selected and was deployed spanning the M2/M1 segment. The device was allowed to intercalated with the clot for 3 minutes. The microcatheter was removed. The aspiration catheter was advanced to the level of occlusion and connected to an aspiration pump. The thrombectomy device and aspiration catheter were removed under constant aspiration. Post thrombectomy right internal carotid artery angiograms with frontal and lateral views showed recanalization of the proximal right M2/MCA, TICI 3. Next, flat panel CT of the head was obtained and post processed in a separate workstation with concurrent attending physician supervision. Selected images were sent to  PACS. Flat panel CT was negative for subarachnoid bleed. After about 10 minutes, biplane cerebral angiogram was repeated demonstrating continued recanalization of the right M2/MCA divisions. Right common femoral artery angiogram was obtained in right anterior oblique view. The puncture is at the level of the common femoral artery. The artery has normal caliber, adequate for closure device. The sheath was exchanged over the wire for an 8 [place] Angio-Seal which was utilized for access closure. Immediate hemostasis was achieved. Patient was then extubated and transferred to the ICU in stable condition. IMPRESSION: 1. Successful mechanical thrombectomy for high-grade partial occlusion of the proximal M2 divisions (superior and inferior) of the right MCA using the stentriever technique x1. 2. Flat panel head CT was negative for subarachnoid bleed. PLAN: 1. Transfer to ICU. 2. Blood pressure goal of 120-140 mm Hg. Electronically Signed   By: Frazier Richards M.D.   On: 03/21/2022 15:42   IR CT Head Ltd  Result Date: 03/21/2022 INDICATION: 56 year old female with multiple medical issues with history of hypertension, hyperlipidemia, diabetes mellitus, previous stroke, status post right M1 thrombectomy on 01/21/2022, presented with right MCA syndrome. EXAM: ULTRASOUND-GUIDED VASCULAR ACCESS DIAGNOSTIC CEREBRAL ANGIOGRAM MECHANICAL THROMBECTOMY FLAT PANEL HEAD CT COMPARISON:  None Available. MEDICATIONS: Ancef 2 g IV. The antibiotic was administered within 1 hour of the procedure Performing surgeon: Dr. Frazier Richards Assistant: Dr. Karenann Cai ANESTHESIA/SEDATION: The procedure was performed under general  anesthesia. CONTRAST:  50 mL of Omnipaque 300 milligram/mL FLUOROSCOPY: Fluoro time: 16.6 minutes Radiation Exposure Index (as provided by the fluoroscopic device): 935.7 mGy Kerma COMPLICATIONS: SIR Level A - No therapy, no consequence. TECHNIQUE: Informed phone consent was obtained from the patient's niece after a  thorough discussion of the procedural risks, benefits and alternatives. All questions were addressed. Maximal Sterile Barrier Technique was utilized including caps, mask, sterile gowns, sterile gloves, sterile drape, hand hygiene and skin antiseptic. A timeout was performed prior to the initiation of the procedure. The right groin was prepped and draped in the usual sterile fashion. Using a micropuncture kit and the modified Seldinger technique, access was gained to the right common femoral artery and an 8 [place] by 25 cm sheath was placed. Real-time ultrasound guidance was utilized for vascular access including the acquisition of a permanent ultrasound image documenting patency of the accessed vessel. Under fluoroscopy, a Zoom 88 guide catheter was navigated over a 5 [place] neuron select Berenstein catheter and a 0.035" Terumo Glidewire into the aortic arch. The catheter was placed into the right common carotid artery and then advanced into the right internal carotid artery under roadmap guidance. The diagnostic catheter was removed. Frontal and lateral angiograms of the head were obtained. FINDINGS: 1. Normal caliber of the right common femoral artery, adequate for vascular access. 2. High-grade partial occlusion of the proximal M2/MCA (superior and inferior divisions). 3. Delayed flow into the distal branches of the MCA. PROCEDURE: Using biplane roadmap, a zoom 055 aspiration catheter was navigated over an Aristotle 24 microguidewire into the cavernous segment of the right ICA and then the Aristotle 024 subtle wire was navigated into the superior division of the right MCA. However, the aspiration catheter could not be advanced to the site of the high-grade partial occlusion. Next, phenom 21 and synchro 14 microwire were selected and micro catheterization of the M3 segment of the superior division of the right MCA was performed. Subsequently, 3 mm x 20 mm solitaire X stent was selected and was deployed spanning  the M2/M1 segment. The device was allowed to intercalated with the clot for 3 minutes. The microcatheter was removed. The aspiration catheter was advanced to the level of occlusion and connected to an aspiration pump. The thrombectomy device and aspiration catheter were removed under constant aspiration. Post thrombectomy right internal carotid artery angiograms with frontal and lateral views showed recanalization of the proximal right M2/MCA, TICI 3. Next, flat panel CT of the head was obtained and post processed in a separate workstation with concurrent attending physician supervision. Selected images were sent to PACS. Flat panel CT was negative for subarachnoid bleed. After about 10 minutes, biplane cerebral angiogram was repeated demonstrating continued recanalization of the right M2/MCA divisions. Right common femoral artery angiogram was obtained in right anterior oblique view. The puncture is at the level of the common femoral artery. The artery has normal caliber, adequate for closure device. The sheath was exchanged over the wire for an 8 [place] Angio-Seal which was utilized for access closure. Immediate hemostasis was achieved. Patient was then extubated and transferred to the ICU in stable condition. IMPRESSION: 1. Successful mechanical thrombectomy for high-grade partial occlusion of the proximal M2 divisions (superior and inferior) of the right MCA using the stentriever technique x1. 2. Flat panel head CT was negative for subarachnoid bleed. PLAN: 1. Transfer to ICU. 2. Blood pressure goal of 120-140 mm Hg. Electronically Signed   By: Frazier Richards M.D.   On: 03/21/2022 15:42  CT ANGIO HEAD NECK W WO CM W PERF (CODE STROKE)  Result Date: 03/21/2022 CLINICAL DATA:  Neuro deficit, acute, stroke suspected. EXAM: CT ANGIOGRAPHY HEAD AND NECK CT PERFUSION BRAIN TECHNIQUE: Multidetector CT imaging of the head and neck was performed using the standard protocol during bolus administration of intravenous  contrast. Multiplanar CT image reconstructions and MIPs were obtained to evaluate the vascular anatomy. Carotid stenosis measurements (when applicable) are obtained utilizing NASCET criteria, using the distal internal carotid diameter as the denominator. Multiphase CT imaging of the brain was performed following IV bolus contrast injection. Subsequent parametric perfusion maps were calculated using RAPID software. RADIATION DOSE REDUCTION: This exam was performed according to the departmental dose-optimization program which includes automated exposure control, adjustment of the mA and/or kV according to patient size and/or use of iterative reconstruction technique. CONTRAST:  154m OMNIPAQUE IOHEXOL 350 MG/ML SOLN COMPARISON:  CT angiogram January 21, 2022. FINDINGS: CTA NECK FINDINGS Aortic arch: Standard branching. Imaged portion shows no evidence of aneurysm or dissection. Atherosclerotic calcified plaques are seen in the aortic arch. No significant stenosis of the major arch vessel origins. Right carotid system: Atherosclerotic changes of the right carotid bifurcation without hemodynamically significant stenosis. Left carotid system: Mild atherosclerotic changes of the left carotid bifurcation without hemodynamically significant stenosis. Vertebral arteries: Atherosclerotic changes at the origin of the right vertebral artery resulting in severe stenosis. Skeleton: No acute or aggressive process identified. Other neck: 2.4 cm left thyroid lobe lobe nodule. Recommend nonemergent thyroid [place] Reference: J Am Coll Radiol. 2015 Feb;12(2): 143-50. Upper chest: Bilateral pleural effusion. Scattered opacities in the bilateral lung apices, some of them appear nodular. Review of the MIP images confirms the above findings CTA HEAD FINDINGS Anterior circulation: Short segment of occlusion of the proximal right M2/MCA posterior division branch with distal reconstitution by collaterals. Calcified plaques in the bilateral carotid  siphons. Bilateral ACA is and left MCA have normal caliber. Posterior circulation: No significant stenosis, proximal occlusion, aneurysm, or vascular malformation. Venous sinuses: As permitted by contrast timing, patent. Anatomic variants: None significant. Review of the MIP images confirms the above findings CT Brain Perfusion Findings: ASPECTS: 9 CBF (<30%) Volume: 046mPerfusion (Tmax>6.0s) volume: 2113mismatch Volume: 31m81mfarction Location:Right parietal lobe. IMPRESSION: 1. Acute right M2/MCA posterior division occlusion. 2. A 21 mL of ischemic penumbra right identified. 3. Scattered opacities in the bilateral lung apices, likely infectious in nature. However, given nodular appearance, follow-up chest CT is recommended. 4. A 2.4 cm thyroid lobe nodule. These results were called by telephone at the time of interpretation on 03/21/2022 at 1:36 pm to provider [facility] verbally acknowledged these results. Electronically Signed   By: KatyPedro Earls.   On: 03/21/2022 13:48   CT HEAD CODE STROKE WO CONTRAST  Result Date: 03/21/2022 CLINICAL DATA:  Code stroke. Neuro deficit, acute, stroke suspected. EXAM: CT HEAD WITHOUT CONTRAST TECHNIQUE: Contiguous axial images were obtained from the base of the skull through the vertex without intravenous contrast. RADIATION DOSE REDUCTION: This exam was performed according to the departmental dose-optimization program which includes automated exposure control, adjustment of the mA and/or kV according to patient size and/or use of iterative reconstruction technique. COMPARISON:  MRI of the brain January 22, 2022. FINDINGS: Brain: Small hypodense area involving the cortex in the right frontal lobe may represent acute infarct. Hypodensity within the deep white matter of the right frontal lobe without corresponding cortical involvement with no corresponding white matter disease seen on prior MRI.  No evidence of acute hemorrhage, hydrocephalus,  extra-axial collection or mass lesion/mass effect. Remote infarcts in the right insula, temporal and parietal lobes corresponding to infarcts demonstrated on prior MRI. Vascular: No hyperdense vessel or unexpected calcification. Skull: Normal. Negative for fracture or focal lesion. Sinuses/Orbits: No acute finding. Other: None. ASPECTS [facility] Stroke Program Early CT Score) - Ganglionic level infarction (caudate, lentiform nuclei, internal capsule, insula, M1-M3 cortex): 7 - Supraganglionic infarction (M4-M6 cortex): 1 Total score (0-10 with 10 being normal): 9 IMPRESSION: 1. Hypodensity involving the right frontal cortex concerning for acute infarct. 2. Large area of hypodensity within the deep white matter of the right frontal lobe without corresponding cortical hypodensity. No white matter disease seen on prior MRI, question evolving infarct. 3. ASPECTS is 9. These results were called by telephone at the time of interpretation on 03/21/2022 at 1:19 pm to provider [facility] , who verbally acknowledged these results. Electronically Signed   By: Pedro Earls M.D.   On: 03/21/2022 13:20     Assessment and Plan:   New onset atrial fibrillation with rapid ventricular rate - Asymptomatic except felt diaphoresis.  Given IV metoprolol 5 mg and now on Cardizem drip.  -Echocardiogram 01/2022 during for stroke showed preserved LV function at 55 to 60%, moderate LVH, mild MR. No TEE performed. - No echo this admission. She is scheduled for TEE tomorrow.  Neurology is asking to weigh during TEE now that she has a confirmed atrial fibrillation. -Check TSH -Patient may need long-term anticoagulation. Currently on aspirin and Plavix. MR of brain yesterday showed petechial hemorrhage without frank hemorrhagic transformation or significant mass effect. -Start anticoagulation when okay with neurology.  2.  Hypertension -Permissive hypertension for neurology team -Now on Cardizem drip  3.   End-stage renal disease on hemodialysis -Per nephrology  4. Stroke - Underwent Successful mechanical thrombectomy for high-grade partial occlusion of the proximal M2 divisions (superior and inferior) of the right MCA using the stentriever technique x1.  Placed on Aspirin and Plavix.   Risk Assessment/Risk Scores:   CHA2DS2-VASc Score =6   For questions or updates, please contact [place] Please consult [email or web address] for contact info under  Jarrett Soho, PA  03/23/2022 2:38 PM  As above, patient seen and examined.  Briefly she is a 56 year old female with past medical history of hypertension, hyperlipidemia, diabetes mellitus, peripheral vascular disease status post bilateral BKA, end-stage renal disease dialysis dependent, recent CVA for evaluation of atrial fibrillation/flutter.  Echocardiogram June 2023 showed normal LV function, moderate left ventricular hypertrophy, grade 2 diastolic dysfunction, mild right ventricular enlargement, moderate left atrial enlargement, mild to moderate mitral regurgitation.  Patient recently discharged following acute MCA infarct requiring thrombolytic therapy and mechanical thrombectomy.  Patient was readmitted on August 8 with strokelike symptoms.  She underwent successful mechanical thrombectomy for high-grade partial occlusion of proximal M2 of the right MCA.  Today she developed atrial fibrillation/flutter and cardiology asked to evaluate.  Note she denies dyspnea, chest pain or palpitations. Electrocardiogram shows atrial fibrillation with PVCs or aberrantly conducted beats, nonspecific ST changes, left axis deviation and IVCD.  BUN 71 and creatinine 10.25.  White blood cell count 13.8, hemoglobin 12.8.  1 paroxysmal atrial fibrillation/flutter-CHA2DS2-VASc is 6.  Begin anticoagulation when okay with neurology.  Would recommend apixaban 5 mg twice daily.  Will need to discuss combination of antiplatelet therapy (following athrectomy of  MCA) and apixaban with neurology.  Continue Cardizem for rate control and advance as needed.  2 recent CVA-neurology  is requesting transesophageal echocardiogram and we will arrange.  Likely embolic event from atrial arrhythmias.  Patient will not require loop monitor as we have now diagnosed atrial arrhythmias.  3 hyperlipidemia-continue pravastatin.  Patient has an allergy to Crestor and Lipitor.  4 end-stage renal disease-dialysis per nephrology.  5 hypertension-BP controlled.  Kirk Ruths, MD

## 2022-03-23 NOTE — Significant Event (Signed)
Rapid Response Event Note   Reason for Call :  New onset Afib RVR  Initial Focused Assessment:  Pt lying in bed, alert. Skin is warm, moist. She had a hypoglycemic event earlier today. CBG 55 to 72 following oral juice intake. Pt denies shortness of breath, but she is tachypneic and mildly labored. Lung sounds are clear. Heart rate is fast, irregular.   VS: T 97.52F, BP 103/79, HR 150, RR 30, SpO2 100% on 2LNC (2LNC applied for comfort) CBG: 79  Interventions:  -5mg  IV Lopressor x1  -Cardizem gtt titratable   Plan of Care:  -Titrate Cardizem per order to achieve goal HR -Closely monitor BP during initiation and first four hours of maintenance  Call rapid response for additional needs  Event Summary:  MD Notified: Dr. Erlinda Hong Call Time: 7680 Arrival Time: 8811  End Time: [place], RN

## 2022-03-23 NOTE — Progress Notes (Signed)
Tele reported HR 160s to 170s, Dr Erlinda Hong notified, EKS stat order and showed A fib RVR, Dr Erlinda Hong will notify cardiology and put orders, patient BP 105/65 Os 96 on room air. Patient is somnolence. Will continue to monitor.

## 2022-03-23 NOTE — Progress Notes (Signed)
SLP Cancellation Note  Patient Details Name: Cindy Osborne MRN: [id number] DOB: 06/27/1966   Cancelled treatment:       Reason Eval/Treat Not Completed: Patient at procedure or test/unavailable (Pt off unit at this time for HD. SLP will follow up.)  Toryn Dewalt I. Hardin Negus, [place], [place] Office number [phone]  Horton Marshall 03/23/2022, 8:50 AM

## 2022-03-23 NOTE — Inpatient Diabetes Management (Signed)
Inpatient Diabetes Program Recommendations  AACE/ADA: New Consensus Statement on Inpatient Glycemic Control (2015)  Target Ranges:  Prepandial:   less than 140 mg/dL      Peak postprandial:   less than 180 mg/dL (1-2 hours)      Critically ill patients:  140 - 180 mg/dL   Lab Results  Component Value Date   GLUCAP 55 (L) 03/23/2022   HGBA1C 6.0 (H) 01/22/2022    Review of Glycemic Control  Latest Reference Range & Units 03/23/22 03:28 03/23/22 07:40 03/23/22 11:52 03/23/22 11:55  Glucose-Capillary 70 - 99 mg/dL 94 321 (H) 69 (L) 55 (L)  (H): Data is abnormally high (L): Data is abnormally low Diabetes history: Type 2 DM Outpatient Diabetes medications: Basaglar 10-12 units QD Current orders for Inpatient glycemic control: Novolog 0-20 units Q4H  Inpatient Diabetes Program Recommendations:    Noted hypoglycemia this AM of 55 mg/dL following Novolog 12 units per order.  Given patient ESRD on HD would recommend: -Adding Semglee 5 units QHS -Changing correction to Novolog 0-6 units TID & HS.   Thanks, Bronson Curb, MSN, RNC-OB Diabetes Coordinator [phone] (8a-5p)

## 2022-03-23 NOTE — Plan of Care (Addendum)
  Problem: Education: Goal: Knowledge of General Education information will improve Description: Including pain rating scale, medication(s)/side effects and non-pharmacologic comfort measures Outcome: Progressing Note: Patient with period of forgetfulness. Continue with nursing round, neuro checks, and reorientation.

## 2022-03-23 NOTE — Progress Notes (Signed)
PT Cancellation Note  Patient Details Name: Cindy Osborne MRN: [id number] DOB: [birth year]   Cancelled Treatment:    Reason Eval/Treat Not Completed: Medical issues which prohibited therapy  Per RN, pt in afib with RVR with HR 150s.    Cindy Osborne, PT Acute Rehabilitation Services  Office [phone]  Rexanne Mano 03/23/2022, 2:50 PM

## 2022-03-24 ENCOUNTER — Encounter (HOSPITAL_COMMUNITY)
Admission: EM | Disposition: A | Discharge status: Rehabilitation Facility | Payer: Self-pay | Source: Home / Self Care | Attending: Neurology

## 2022-03-24 DIAGNOSIS — Z8673 Personal history of transient ischemic attack (TIA), and cerebral infarction without residual deficits: Secondary | ICD-10-CM | POA: Diagnosis not present

## 2022-03-24 DIAGNOSIS — I480 Paroxysmal atrial fibrillation: Secondary | ICD-10-CM | POA: Diagnosis not present

## 2022-03-24 DIAGNOSIS — I63411 Cerebral infarction due to embolism of right middle cerebral artery: Secondary | ICD-10-CM | POA: Diagnosis not present

## 2022-03-24 DIAGNOSIS — I4891 Unspecified atrial fibrillation: Secondary | ICD-10-CM | POA: Diagnosis not present

## 2022-03-24 DIAGNOSIS — N186 End stage renal disease: Secondary | ICD-10-CM | POA: Diagnosis not present

## 2022-03-24 LAB — RENAL FUNCTION PANEL
Albumin: 3.2 g/dL — ABNORMAL LOW (ref 3.5–5.0)
Anion gap: 16 — ABNORMAL HIGH (ref 5–15)
BUN: 30 mg/dL — ABNORMAL HIGH (ref 6–20)
CO2: 27 mmol/L (ref 22–32)
Calcium: 9.9 mg/dL (ref 8.9–10.3)
Chloride: 93 mmol/L — ABNORMAL LOW (ref 98–111)
Creatinine, Ser: 6.77 mg/dL — ABNORMAL HIGH (ref 0.44–1.00)
GFR, Estimated: 7 mL/min — ABNORMAL LOW (ref >60)
Glucose, Bld: 144 mg/dL — ABNORMAL HIGH (ref 70–99)
Phosphorus: 5.3 mg/dL — ABNORMAL HIGH (ref 2.5–4.6)
Potassium: 4 mmol/L (ref 3.5–5.1)
Sodium: 136 mmol/L (ref 135–145)

## 2022-03-24 LAB — HEPARIN LEVEL (UNFRACTIONATED)
Heparin Unfractionated: 0.1 IU/mL — ABNORMAL LOW (ref 0.30–0.70)
Heparin Unfractionated: 0.21 IU/mL — ABNORMAL LOW (ref 0.30–0.70)

## 2022-03-24 LAB — CBC
HCT: 37.4 % (ref 36.0–46.0)
Hemoglobin: 12 g/dL (ref 12.0–15.0)
MCH: 27.8 pg (ref 26.0–34.0)
MCHC: 32.1 g/dL (ref 30.0–36.0)
MCV: 86.8 fL (ref 80.0–100.0)
Platelets: 217 10*3/uL (ref 150–400)
RBC: 4.31 MIL/uL (ref 3.87–5.11)
RDW: 17.3 % — ABNORMAL HIGH (ref 11.5–15.5)
WBC: 8.9 10*3/uL (ref 4.0–10.5)
nRBC: 0 % (ref 0.0–0.2)

## 2022-03-24 LAB — GLUCOSE, CAPILLARY
Glucose-Capillary: 117 mg/dL — ABNORMAL HIGH (ref 70–99)
Glucose-Capillary: 120 mg/dL — ABNORMAL HIGH (ref 70–99)
Glucose-Capillary: 129 mg/dL — ABNORMAL HIGH (ref 70–99)
Glucose-Capillary: 136 mg/dL — ABNORMAL HIGH (ref 70–99)
Glucose-Capillary: 140 mg/dL — ABNORMAL HIGH (ref 70–99)
Glucose-Capillary: 141 mg/dL — ABNORMAL HIGH (ref 70–99)
Glucose-Capillary: 186 mg/dL — ABNORMAL HIGH (ref 70–99)

## 2022-03-24 SURGERY — ECHOCARDIOGRAM, TRANSESOPHAGEAL
Anesthesia: Monitor Anesthesia Care

## 2022-03-24 MED ORDER — CHLORHEXIDINE GLUCONATE CLOTH 2 % EX PADS
6.0000 | MEDICATED_PAD | Freq: Every day | CUTANEOUS | Status: DC
  Administered 2022-03-24 – 2022-03-25 (×2): 6 via TOPICAL

## 2022-03-24 MED ORDER — APIXABAN 5 MG PO TABS
5.0000 mg | ORAL_TABLET | Freq: Two times a day (BID) | ORAL | Status: DC
  Administered 2022-03-24 – 2022-03-25 (×3): 5 mg via ORAL
  Filled 2022-03-24 (×3): qty 1

## 2022-03-24 MED ORDER — METOPROLOL SUCCINATE ER 100 MG PO TB24
200.0000 mg | ORAL_TABLET | Freq: Every day | ORAL | Status: DC
  Administered 2022-03-24 – 2022-03-25 (×2): 200 mg via ORAL
  Filled 2022-03-24 (×2): qty 2

## 2022-03-24 NOTE — Progress Notes (Addendum)
Inpatient Rehabilitation Admissions Coordinator   Noted Cardizem gtt just discontinued. Discussed with Dr Erlinda Hong. We can plan admit to CIR Saturday. I have notified Veronica in hemodialysis of planned admit. I met with patient and contacted her sister, Ivin Booty, and they are in agreement. Dr Aretta Nip will see her tomorrow morning for final clearance to admit. [place] can contact rehab Saturday at 12 noon at [phone] to provide report and clarify which room she will admit to. Acute team made aware.I will make the arrangements to admit to CIR Saturday.  Danne Baxter, RN, MSN Rehab Admissions Coordinator [phone] 03/24/2022 2:18 PM

## 2022-03-24 NOTE — Plan of Care (Signed)

## 2022-03-24 NOTE — Progress Notes (Signed)
  Inpatient Rehabilitation Admissions Coordinator   Met with patient and sister, Ivin Booty at bedside for rehab assessment. We discussed goals and expectations of a possible CIR admit. They prefer CIR for rehab. Family can provide expected caregiver support that is recommended.  I await medical readiness for Cir admit. Feel she is an excellent candidate.  Danne Baxter, RN, MSN Rehab Admissions Coordinator [phone]

## 2022-03-24 NOTE — Progress Notes (Signed)
Pt placed on dreamstation earlier in shift, checked on pt and found resting with mask off, does not want to wear to sleep. Vitals stable, resting comfortably on RA.

## 2022-03-24 NOTE — Progress Notes (Signed)
ANTICOAGULATION CONSULT NOTE- follow-up Pharmacy Consult for  Heparin  Indication: atrial fibrillation Brief A/P: Heparin level subtherapeutic. Increase Heparin rate   Allergies  Allergen Reactions   Augmentin [Amoxicillin-Pot Clavulanate] Shortness Of Breath   Atorvastatin Other (See Comments)    Back pain   Pioglitazone Other (See Comments)    Edema   Peanut-Containing Drug Products Itching   Rosuvastatin Itching and Other (See Comments)    Back pain   Shellfish-Derived Products Itching and Rash    shrimp   Sulfa Antibiotics Rash and Other (See Comments)    Shedding of skin    Patient Measurements: Height: 5\' 9"  (175.3 cm) (recorded on 03/20/22) Weight: 106.8 kg (235 lb 7.2 oz) IBW/kg (Calculated) : 66.2 Heparin Dosing Weight: 90 kg  Vital Signs: Temp: 97.6 F (36.4 C) (08/11 0420) Temp Source: Oral (08/11 0420) BP: 151/59 (08/11 0420) Pulse Rate: 85 (08/11 0420)  Labs: Recent Labs    03/21/22 1320 03/21/22 1322 03/24/22 0017 03/24/22 1019  HGB 12.8 13.9  --  12.0  HCT 39.6 41.0  --  37.4  PLT 227  --   --  217  APTT 29  --   --   --   LABPROT 16.2*  --   --   --   INR 1.3*  --   --   --   HEPARINUNFRC  --   --  0.10* 0.21*  CREATININE 10.25* 11.30*  --  6.77*     Estimated Creatinine Clearance: 12.1 mL/min (A) (by C-G formula based on SCr of 6.77 mg/dL (H)).  Assessment: 56 y.o female admitted with CVA and Afib for heparin.  Goal of Therapy:  Heparin level 0.3-0.5 units/ml Monitor platelets by anticoagulation protocol: Yes   Plan:  Increase Heparin 1500 units/hr Check heparin level in 8 hours.   Vaughan Basta BS, PharmD, BCPS Clinical Pharmacist 03/24/2022 12:04 PM  Contact: [phone] after 3 PM  "Be curious, not judgmental..." -Jamal Maes

## 2022-03-24 NOTE — Progress Notes (Signed)
ANTICOAGULATION CONSULT NOTE- follow-up Pharmacy Consult for  transition from heparin infusion to apixaban Indication: atrial fibrillation   Allergies  Allergen Reactions   Augmentin [Amoxicillin-Pot Clavulanate] Shortness Of Breath   Atorvastatin Other (See Comments)    Back pain   Pioglitazone Other (See Comments)    Edema   Peanut-Containing Drug Products Itching   Rosuvastatin Itching and Other (See Comments)    Back pain   Shellfish-Derived Products Itching and Rash    shrimp   Sulfa Antibiotics Rash and Other (See Comments)    Shedding of skin    Patient Measurements: Height: 5\' 9"  (175.3 cm) (recorded on 03/20/22) Weight: 106.8 kg (235 lb 7.2 oz) IBW/kg (Calculated) : 66.2 Heparin Dosing Weight: 90 kg  Vital Signs: Temp: 98.4 F (36.9 C) (08/11 1157) Temp Source: Oral (08/11 1157) BP: 147/61 (08/11 0859) Pulse Rate: 88 (08/11 1157)  Labs: Recent Labs    03/24/22 0017 03/24/22 1019  HGB  --  12.0  HCT  --  37.4  PLT  --  217  HEPARINUNFRC 0.10* 0.21*  CREATININE  --  6.77*     Estimated Creatinine Clearance: 12.1 mL/min (A) (by C-G formula based on SCr of 6.77 mg/dL (H)).  Assessment: 56 y.o female admitted with CVA and Afib. Transitioned from heparin infusion to apixaban  Goal of Therapy:  Monitor platelets by anticoagulation protocol: Yes   Plan:  Discontinue heparin infusion. Start apixaban 5 mg po bid Monitor for signs of bleeding and period CBC. Pharmacy will sign off the consult but continue to monitor in the background making recommendations prn   Bryston Colocho BS, PharmD, BCPS Clinical Pharmacist 03/24/2022 1:40 PM  Contact: [phone] after 3 PM  "Be curious, not judgmental..." -Jamal Maes

## 2022-03-24 NOTE — Progress Notes (Addendum)
STROKE TEAM PROGRESS NOTE   INTERVAL HISTORY No further a-fib with RVR or other acute events overnight. She is endorsing feeling better today. No further diaphoresis or other symptoms concerning to her. She is feeling okay today.  We discussed her current status, diagnosis of atrial fibrillation, role of Eliquis and importance of compliance. We also discussed pending admission to IPR, hopefully tomorrow. Her questions were answered.   Vitals:   03/23/22 2254 03/24/22 0420 03/24/22 0859 03/24/22 1157  BP: (!) 172/60 (!) 151/59 (!) 147/61   Pulse: 88 85 87 88  Resp: 18 18 18 16   Temp: 98 F (36.7 C) 97.6 F (36.4 C) 98.2 F (36.8 C) 98.4 F (36.9 C)  TempSrc: Oral Oral Oral Oral  SpO2:  (!) 82% 97% 98%  Weight:      Height:       CBC:  Recent Labs  Lab 03/21/22 1320 03/21/22 1322 03/24/22 1019  WBC 13.8*  --  8.9  NEUTROABS 11.8*  --   --   HGB 12.8 13.9 12.0  HCT 39.6 41.0 37.4  MCV 86.7  --  86.8  PLT 227  --  867   Basic Metabolic Panel:  Recent Labs  Lab 03/21/22 1320 03/21/22 1322 03/21/22 1826 03/24/22 1019  NA 139 138  --  136  K 5.1 4.8  --  4.0  CL 99 103  --  93*  CO2 22  --   --  27  GLUCOSE 193* 187*  --  144*  BUN 71* 62*  --  30*  CREATININE 10.25* 11.30*  --  6.77*  CALCIUM 10.0  --   --  9.9  PHOS  --   --  6.0* 5.3*   Lipid Panel:  Recent Labs  Lab 03/22/22 0243  CHOL 107  TRIG 84  HDL 35*  CHOLHDL 3.1  VLDL 17  LDLCALC 55   HgbA1c: No results for input(s): "HGBA1C" in the last 168 hours. Urine Drug Screen: No results for input(s): "LABOPIA", "COCAINSCRNUR", "LABBENZ", "AMPHETMU", "THCU", "LABBARB" in the last 168 hours.  Alcohol Level  Recent Labs  Lab 03/21/22 1320  ETH <10    IMAGING past 24 hours No results found.  PHYSICAL EXAM Neuro: Mental Status: Patient is sitting up in bed in NAD Patient is able to give a clear and coherent history. Card: RRR with Sinus rhythm on monitor.  Abd: soft, non-distended,  NTTP  Cranial Nerves: II: Visual Fields are full. Pupils are equal, round, and reactive to light.   III,IV, VI: EOMI without ptosis or diplopia.Able to pass midpoint  V: Facial sensation is slightly decreased on left side VII: Facial weakness with left facial droop VIII: Hearing is intact to voice X: Palate is midline and palate elevates symmetrically, phonation intact XI: Shoulder shrug is asymmetric with LUE weakness XII: tongue is midline without atrophy or fasciculations.  Motor: Tone is normal. Bulk is normal. Muscle Strength: LUE 2/5  L Grip- 3/5  LLE BKA 4/5  / RUE 5/5 RLE BKA- 5/5 Sensory: Sensation is decreased on left side with light touch. No extinction to DSS   Cerebellar: FNF and HKS are intact on the right   ASSESSMENT/PLAN Ms. Cindy Osborne is a 56 y.o. female with history of a stroke in 01/2022 due to left M2 occlusion treated with thrombectomy, DM2, ESRD, HTN, RLS, Obesity, PVD,  Now presented with acute onset of left sided hemiplegia ,facial droop, left hemi neglect and left sensory loss. CTA Head/Neck with acute right  M2/MCA posterior division occlusion.She was not a TNK candidate due to time She was a VIR  candidate. Found to have partial occlusion of 2 branches of 3 vessels at MCA trifurcation and under went mechanical thrombectomy.Now s/p mechanical thrombectomy of partial 2 branches of 3 vessel @ MCA Bifurcation with neurological improvement. Most recent A1C In June 2023 was 6.0 and 2D echo showed EF 55%. LDL currently 35. Then with Atrial fibrillation with RVR on 8/10.    Stroke: Moderate-sized patchy acute ischemic right MCA infarcts likely due to new diagnosed atrial fibrillation.  CT showed right frontal hypodensity.   CTA head and neck right M2 occlusion.   CTP 0/21cc.   Status post IR with TICI3 reperfusion.   MRI showed moderate scattered right MCA infarcts.   EF 55 to 60% in 01/2022 LE venous Doppler negative for DVT.   A1c 6.0 LDL 55.   DVT  prophylaxis - Eliquis Aspirin PTA, now on heparin IV, switched to Eliquis for stroke prevention. Therapists are recommending IPR.  She has been accepted and plan is to admit tomorrow if neuro stable  A-fib RVR New diagnosis HR up to 160s on 8/10 Cardiology on board Status post Cardizem IV, converted to NSR -> DC Resume home Toprol-XL 200 mg  History of stroke 01/23/2022 for left-sided weakness numbness.  CT showed right MCA hyperdense sign.  Status post TNK.  CT head and neck showed right M1 occlusion.  Status post IR with TICI3 reperfusion.  MRI showed small right MCA infarcts.  MRI showed right MCA patent.  EF 55 to 60%.  LDL 79, A1c 6.0.  Patient discharged on DAPT and Lipitor 40.   Patient was suppose to have outpatient clinic follow up for an TEE and Loop Recorder. Patient was seen for her stroke follow up on 03/20/22 with Dr Leta Baptist.  Patient reported no residual left from the stroke.  At baseline patient can walk with cane using prosthetics.  Hyperlipidemia LDL 35, well controlled and at goal less than 70 Continue home pravastatin 40 mg po q day due to intolerance with Crestor and Lipitor.  Continue statin on discharge  ESRD on HD: - Appreciate Nephrology consult and following  - Monitor renal function/labs QD - Renal diet  - Renal dose meds  - HD TTS   DM2 A1c 6.0, controlled Sliding scale insulin CBG monitoring Close PCP follow-up   Scattered opacities in the bilateral lung apices on CTA, likely infectious in nature.  - Given nodular appearance, follow-up chest CT is recommended by Radiology. - Small thyroid nodule also noted on CTA  -Will Need follow up with PCP to address these issues  Dr. Erlinda Hong directed the plan of care.  Cindy Brooke, NP-C   ATTENDING NOTE: I reviewed above note and agree with the assessment and plan. Pt was seen and examined.   Sister at bedside.  Patient lying in bed, no complaints, stated that his nausea and diaphoresis have  resolved.  Her left upper extremity now 3/5 bicep and 2+/5 tricep.  Left lower extremity 3/5.  A-fib RVR has converted to NSR, cardiology with Dr. Stanford Breed on board, had stopped Cardizem IV, switch to home Toprol XR 200.  Heparin IV will be stopped also switch to p.o. Eliquis.  Continue HD per nephrology.  PT therapy recommend CIR.  Discussed with CIR coordinator, plan for CIR placement tomorrow if neuro stable.  For detailed assessment and plan, please refer to above/below as I have made changes wherever appropriate.   [name],  MD PhD Stroke Neurology 03/24/2022 5:53 PM    To contact Stroke Continuity provider, please refer to [email or web address] After hours, contact General Neurology

## 2022-03-24 NOTE — Progress Notes (Signed)
SLP Cancellation Note  Patient Details Name: Danniella Robben MRN: [id number] DOB: [birth year]   Cancelled treatment:       Reason Eval/Treat Not Completed: Patient at procedure or test/unavailable (Pt currently working with OT. SLP will follow up later as schedule allows.)  Maansi Wike I. Hardin Negus, [place], [place] Office number [phone]  Horton Marshall 03/24/2022, 2:12 PM

## 2022-03-24 NOTE — Progress Notes (Signed)
[facility] Progress Note   Subjective:   Last HD short due to A.fib RVR. Back in sinus rhythm. Seen by cardiology and started on metoprolol. Pt denies SOB, CP, dizziness, or nausea. C/o pain in her left shoulder and hip since her fall and a small bump on her forehead- will defer to primary team.   Objective Vitals:   03/23/22 1600 03/23/22 2019 03/23/22 2254 03/24/22 0420  BP:  (!) 140/87 (!) 172/60 (!) 151/59  Pulse:  88 88 85  Resp:  16 18 18   Temp:  98.2 F (36.8 C) 98 F (36.7 C) 97.6 F (36.4 C)  TempSrc:  Oral Oral Oral  SpO2:    (!) 82%  Weight:      Height: 5\' 9"  (1.753 m)      Physical Exam General: Well developed, alert female in NAD Heart: RRR, no murmurs, rubs or gallops Lungs: CTA bilaterally without wheezing, rhonchi or rales Abdomen: Soft, non-distended, +BS Extremities: No edema b/l lower extremities Dialysis Access: LUE AVF + bruit  Additional Objective Labs: Basic Metabolic Panel: Recent Labs  Lab 03/21/22 1320 03/21/22 1322 03/21/22 1826  NA 139 138  --   K 5.1 4.8  --   CL 99 103  --   CO2 22  --   --   GLUCOSE 193* 187*  --   BUN 71* 62*  --   CREATININE 10.25* 11.30*  --   CALCIUM 10.0  --   --   PHOS  --   --  6.0*   Liver Function Tests: Recent Labs  Lab 03/21/22 1320  AST 44*  ALT 18  ALKPHOS 151*  BILITOT 0.9  PROT 8.4*  ALBUMIN 3.7   No results for input(s): "LIPASE", "AMYLASE" in the last 168 hours. CBC: Recent Labs  Lab 03/21/22 1320 03/21/22 1322  WBC 13.8*  --   NEUTROABS 11.8*  --   HGB 12.8 13.9  HCT 39.6 41.0  MCV 86.7  --   PLT 227  --    Blood Culture No results found for: "SDES", "SPECREQUEST", "CULT", "REPTSTATUS"  Cardiac Enzymes: No results for input(s): "CKTOTAL", "CKMB", "CKMBINDEX", "TROPONINI" in the last 168 hours. CBG: Recent Labs  Lab 03/23/22 1636 03/23/22 2126 03/23/22 2359 03/24/22 0417 03/24/22 0900  GLUCAP 134* 150* 120* 117* 129*   Iron Studies: No results for  input(s): "IRON", "TIBC", "TRANSFERRIN", "FERRITIN" in the last 72 hours. @lablastinr3 @ Studies/Results: VAS [place] LOWER EXTREMITY VENOUS (DVT)  Result Date: 03/22/2022  Lower Venous DVT Study Patient Name:  Cindy Osborne  Date of Exam:   03/22/2022 Medical Rec #: [id number]          Accession #:    [id number] Date of Birth: Dec 18, 1965           Patient Gender: F Patient Age:   56 years Exam Location:  [facility] Procedure:      VAS [place] LOWER EXTREMITY VENOUS (DVT) Referring Phys: Cornelius Moras XU --------------------------------------------------------------------------------  Indications: Stroke.  Comparison Study: No previous exam noted. Performing Technologist: Bobetta Lime BS, RVT  Examination Guidelines: A complete evaluation includes B-mode imaging, spectral Doppler, color Doppler, and power Doppler as needed of all accessible portions of each vessel. Bilateral testing is considered an integral part of a complete examination. Limited examinations for reoccurring indications may be performed as noted. The reflux portion of the exam is performed with the patient in reverse Trendelenburg.  +---------+---------------+---------+-----------+----------+--------------+ RIGHT    CompressibilityPhasicitySpontaneityPropertiesThrombus Aging +---------+---------------+---------+-----------+----------+--------------+ CFV  Full           Yes      Yes                                 +---------+---------------+---------+-----------+----------+--------------+ SFJ      Full                                                        +---------+---------------+---------+-----------+----------+--------------+ FV Prox  Full                                                        +---------+---------------+---------+-----------+----------+--------------+ FV Mid   Full                                                        +---------+---------------+---------+-----------+----------+--------------+  FV DistalFull                                                        +---------+---------------+---------+-----------+----------+--------------+ PFV      Full                                                        +---------+---------------+---------+-----------+----------+--------------+ POP      Full           Yes      Yes                                 +---------+---------------+---------+-----------+----------+--------------+   Right Technical Findings: Not visualized segments include posterior tibial and peroneal veins due to BKA.  +---------+---------------+---------+-----------+----------+--------------+ LEFT     CompressibilityPhasicitySpontaneityPropertiesThrombus Aging +---------+---------------+---------+-----------+----------+--------------+ CFV      Full           Yes      Yes                                 +---------+---------------+---------+-----------+----------+--------------+ SFJ      Full                                                        +---------+---------------+---------+-----------+----------+--------------+ FV Prox  Full                                                        +---------+---------------+---------+-----------+----------+--------------+  FV Mid   Full                                                        +---------+---------------+---------+-----------+----------+--------------+ FV DistalFull                                                        +---------+---------------+---------+-----------+----------+--------------+ PFV      Full                                                        +---------+---------------+---------+-----------+----------+--------------+ POP      Full           Yes      Yes                                 +---------+---------------+---------+-----------+----------+--------------+   Left Technical Findings: Not visualized segments include posterior tibial and posterior  tibial veins due to BKA.   Summary: BILATERAL: - No evidence of deep vein thrombosis seen in the lower extremities, bilaterally. -No evidence of popliteal cyst, bilaterally. RIGHT: - Right BKA  LEFT: - Left BKA  *See table(s) above for measurements and observations. Electronically signed by Harold Barban MD on 03/22/2022 at 11:21:57 PM.    Final    Medications:  heparin 1,400 Units/hr (03/24/22 0617)    amitriptyline  25 mg Oral QHS   Chlorhexidine Gluconate Cloth  6 each Topical Q0600   insulin aspart  0-20 Units Subcutaneous Q4H   metoprolol succinate  200 mg Oral Daily   pravastatin  40 mg Oral q1800    Dialysis Orders: [facility] ([facility] [phone])   4h  104.3kg  400/600  2/2.5 bath  Heparin 2000 LUA AVF - mircera 150 ug q2, last 8/5, last Hb 9.7 - calcitriol 0.5 ug tiw - sensipar 90 po tiw  Assessment/Plan: Acute CVA - sp intervention 8/8. Was here in June for similar event. Per stroke team. Now with PAF, cardiology on board Acute resp failure w/ hypoxemia -  Resolved, On RA this am.  Volume- didn't tolerate vol removal w/ HD. suspect CXR  may have been poor quality, vs neurogenic pulm edema not related to vol overload. Minimal UF with HD tomorrow.  ESRD - on HD TTS. Treatment was shortened yesterday due to a.fib RVR and nausea. No volume excess on exam, will check labs but most likely ok to wait until tomorrow for next HD.  HTN - BP running high. Metoprolol resumed per cardiology.  Anemia of CKD - Hgb >12,  pt is dry most likely running Hb up. Esa last given 8/5, no esa needs.  7.   MBD ckd - CCa and phos both slightly high. Cont sensipar, po vdra w/ HD.   Anice Paganini, PA-C 03/24/2022, 9:45 AM  [facility] Pager: [phone]

## 2022-03-24 NOTE — Care Management Important Message (Signed)
Important Message  Patient Details  Name: Cindy Osborne MRN: [id number] Date of Birth: 1966-06-13   Medicare Important Message Given:  Yes     Hannah Beat 03/24/2022, 3:09 PM

## 2022-03-24 NOTE — PMR Pre-admission (Signed)
PMR Admission Coordinator Pre-Admission Assessment  Patient: Cindy Osborne is an 56 y.o., female MRN: [id number] DOB: August 01, 1966 Height: '5\' 9"'  (175.3 cm) (recorded on 03/20/22) Weight: 106.8 kg  Insurance Information HMO:     PPO:      PCP:      IPA:      80/20:      OTHER:  PRIMARY: Medicare a and b      Policy#: [id number]      Subscriber: pt Benefits:  Phone #: passport one source online     Name: 8/11 Eff. Date: 09/14/21     Deduct: $1600      Out of Pocket Max: none      Life Max: none CIR: 100%      SNF: 20 full days Outpatient: 80%     Co-Pay: 20% Home Health: 100%      Co-Pay: none DME: 80%     Co-Pay: 20% Providers: in network  SECONDARY: Roseanna Rainbow      Policy#: [id number]  Financial Counselor:       Phone#:   The "Data Collection Information Summary" for patients in Inpatient Rehabilitation Facilities with attached "Privacy Act [place] Records" was provided and verbally reviewed with: Patient and Family  Emergency Contact Information Contact Information     Name Relation Home Work Page Sister   [place] Niece   [phone]      Current Medical History  Patient Admitting Diagnosis: CVA  History of Present Illness: 56 year old female with history of Bilateral LE amputations, ESRD on hemodialysis since 3/23, DM, HLD and HTN and right MCA CVA 6/23. Presented on 03/21/22  via EMS with acute onset of left facial droop, left sided weakness and left hemi neglect.   CTA head and neck with acute right M2/MCA posterior division occlusion. She was not a TNK candidate due to time. Found to have a partial occlusion of 2 branches of 3 vessels at MCA trifurcation and underwent mechanical thrombectomy with neurological improvement. Felt moderately sized patchy acute ischemic right MCA distribution infarcts . LDL 35 to continue on pravastatin. On 8/10 after hemodialysis with noted afib with RVR . Cardiology consulted. Began heparin  Gtt and Cardizem drip . Transitioned to Eliquis and Metoprolol on 8/11. ESRD on hemodialysis with Nephrology consulted to maintain schedule. CVA felt due to Afib.   Complete NIHSS TOTAL: 11  Patient's medical record from [facility] has been reviewed by the rehabilitation admission coordinator and physician.  Past Medical History  Past Medical History:  Diagnosis Date   Amputated toe ([place]) 01/31/2012   Right second toe distal phalanx Right great toe   Diabetes mellitus without complication ([place])    History of diabetic ulcer of foot 04/01/2014   Hyperlipidemia    Hypertension    Stroke ([place]) 01/21/2022   Has the patient had major surgery during 100 days prior to admission? Yes  Family History   family history includes Diabetes in her mother and sister.  Current Medications  Current Facility-Administered Medications:    acetaminophen (TYLENOL) tablet 650 mg, 650 mg, Oral, Q4H PRN, 650 mg at 03/24/22 1247 **OR** acetaminophen (TYLENOL) 160 MG/5ML solution 650 mg, 650 mg, Per Tube, Q4H PRN **OR** acetaminophen (TYLENOL) suppository 650 mg, 650 mg, Rectal, Q4H PRN, Kerney Elbe, MD, 650 mg at 03/22/22 0244   amitriptyline (ELAVIL) tablet 25 mg, 25 mg, Oral, QHS, Amie Portland, MD, 25 mg at 03/23/22 2128   apixaban (ELIQUIS) tablet 5 mg,  5 mg, Oral, BID, Reome, Earle J, RPH, 5 mg at 03/24/22 1428   Chlorhexidine Gluconate Cloth 2 % PADS 6 each, 6 each, Topical, Q0600, Roney Jaffe, MD, 6 each at 03/24/22 0502   Chlorhexidine Gluconate Cloth 2 % PADS 6 each, 6 each, Topical, Q0600, Janalee Dane, PA-C, 6 each at 03/24/22 1247   insulin aspart (novoLOG) injection 0-20 Units, 0-20 Units, Subcutaneous, Q4H, Kerney Elbe, MD, 3 Units at 03/24/22 1248   iohexol (OMNIPAQUE) 300 MG/ML solution 100 mL, 100 mL, Intra-arterial, Once PRN, Glori Bickers, MD   labetalol (NORMODYNE) injection 5-20 mg, 5-20 mg, Intravenous, Q2H PRN, [name], MD   metoprolol succinate (TOPROL-XL)  24 hr tablet 200 mg, 200 mg, Oral, Daily, Crenshaw, Denice Bors, MD, 200 mg at 03/24/22 1246   ondansetron (ZOFRAN) injection 4 mg, 4 mg, Intravenous, Q6H PRN, [name], MD   pravastatin (PRAVACHOL) tablet 40 mg, 40 mg, Oral, q1800, [name], MD, 40 mg at 03/23/22 1738   senna-docusate (Senokot-S) tablet 1 tablet, 1 tablet, Oral, QHS PRN, Kerney Elbe, MD  Patients Current Diet:  Diet Order             DIET DYS 2 Room service appropriate? Yes; Fluid consistency: Thin  Diet effective now                  Precautions / Restrictions Precautions Precautions: Fall, Other (comment) Precaution Comments: Bil BKA (chronic) Other Brace: bil prosthetic Restrictions Weight Bearing Restrictions: No   Has the patient had 2 or more falls or a fall with injury in the past year? No  Prior Activity Level Community (5-7x/wk): Mod I with bil LE prosthetics with cane or RW; drives self to OP dialysis  Prior Functional Level Self Care: Did the patient need help bathing, dressing, using the toilet or eating? Independent  Indoor Mobility: Did the patient need assistance with walking from room to room (with or without device)? Independent  Stairs: Did the patient need assistance with internal or external stairs (with or without device)? Independent  Functional Cognition: Did the patient need help planning regular tasks such as shopping or remembering to take medications? Independent  Patient Information Are you of Hispanic, Latino/a,or Spanish origin?: A. No, not of Hispanic, Latino/a, or Spanish origin What is your race?: B. Black or African American Do you need or want an interpreter to communicate with a doctor or health care staff?: 0. No  Patient's Response To:  Health Literacy and Transportation Is the patient able to respond to health literacy and transportation needs?: Yes Health Literacy - How often do you need to have someone help you when you read instructions, pamphlets, or other  written material from your doctor or pharmacy?: Never In the past 12 months, has lack of transportation kept you from medical appointments or from getting medications?: No In the past 12 months, has lack of transportation kept you from meetings, work, or from getting things needed for daily living?: No  Home Assistive Devices / [place]olo Devices/Equipment: Prosthesis Home Equipment: Conservation officer, nature (2 wheels), [employer] - single point, Tub bench, Wheelchair - manual, Grab bars - tub/shower, BSC/3in1, Adaptive equipment  Prior Device Use: Indicate devices/aids used by the patient prior to current illness, exacerbation or injury? Bilateral LE prosthesis and cane or RW for ambulation  Current Functional Level Cognition  Arousal/Alertness: Awake/alert Overall Cognitive Status: Impaired/Different from baseline Current Attention Level: Sustained Orientation Level: Oriented X4 Following Commands: Follows one step commands with increased time  Safety/Judgement: Decreased awareness of safety, Decreased awareness of deficits General Comments: Pt with poor attention span and difficulty following 2+ step commands. Requiring increased assist/cuing for safety as pt has poor insight to her deficits. Difficulty with initiation with standing    Extremity Assessment (includes Sensation/Coordination)  Upper Extremity Assessment: LUE deficits/detail RUE Deficits / Details: WFL RUE Coordination: WNL LUE Deficits / Details: more movemnet distally, able to shrug shoulders but not raise arm, trace activiation noted at biceps with elbow flexion. Pt states sensation is WFL - will benefit from further assessment LUE Coordination: decreased fine motor, decreased gross motor  Lower Extremity Assessment: Defer to PT evaluation LLE Deficits / Details: Grossly weakn compared to R, but able to lift against gravity and against some resistance (difficulty following cues to hold contraction against resistance); able  to detect touch with accuracy, denied numbness/tingling LLE Sensation: WNL LLE Coordination: decreased gross motor    ADLs  Overall ADL's : Needs assistance/impaired Eating/Feeding: Set up, Sitting Grooming: Set up, Sitting Upper Body Bathing: Moderate assistance, Sitting Lower Body Bathing: Bed level, Maximal assistance Upper Body Dressing : Moderate assistance, Sitting Lower Body Dressing: Total assistance, +2 for physical assistance, +2 for safety/equipment, Bed level Lower Body Dressing Details (indicate cue type and reason): Unable to assist with donning prosthetics due to LUE flaccidity Toilet Transfer: Total assistance, +2 for physical assistance, +2 for safety/equipment, Transfer board Toilet Transfer Details (indicate cue type and reason): Attempted with lateral scoot, pt unable to complete Toileting- Clothing Manipulation and Hygiene: Total assistance, +2 for physical assistance, +2 for safety/equipment, Bed level Toileting - Clothing Manipulation Details (indicate cue type and reason): Needed assist with rolling, unable to help with hygiene Functional mobility during ADLs: Total assistance, +2 for physical assistance, +2 for safety/equipment (bed level) General ADL Comments: Pt unable to don prosthetics without total A    Mobility  Overal bed mobility: Needs Assistance Bed Mobility: Supine to Sit, Sit to Supine, Rolling Rolling: Mod assist Supine to sit: Mod assist Sit to supine: Mod assist General bed mobility comments: Mod Multi-modal repeated cues for all; Rolling: cues to reach and assist with bed pad; Supine sit: cues to get hips to EOB and assist to lift trunk; Sit to supine: assist for legs and to control trunk    Transfers  Overall transfer level: Needs assistance Transfers: Bed to chair/wheelchair/BSC Bed to/from chair/wheelchair/BSC transfer type:: Lateral/scoot transfer  Lateral/Scoot Transfers: Max assist General transfer comment: Attempted with STEDY and bed  elevated - pt leaning forward but low initiation in R leg to stand.  Multiple attempts but unable with assist of 1.  Lateral Scooting at EOB: Max cues, mod A with bed pads, cues to coordinate movement with therapist efforts    Ambulation / Gait / Stairs / Wheelchair Mobility  Ambulation/Gait General Gait Details: deferred, awaiting bil prosthetic devices    Posture / Balance Dynamic Sitting Balance Sitting balance - Comments: Pt started with R UE support but was able to progress to close stand by without UE support.  Worked on sitting and reaching forward to tray (pt's food arrived and she ate several bites with therapist).  Pt also was able to reach backward and turn to right but required UE support.  During lateral scooting required min guard. Balance Overall balance assessment: Needs assistance Sitting-balance support: Single extremity supported, Feet supported, No upper extremity supported Sitting balance-Leahy Scale: Poor Sitting balance - Comments: Pt started with R UE support but was able to progress to close stand by  without UE support.  Worked on sitting and reaching forward to tray (pt's food arrived and she ate several bites with therapist).  Pt also was able to reach backward and turn to right but required UE support.  During lateral scooting required min guard. Postural control: Left lateral lean Standing balance comment: deferred, awaiting bil prosthetic devices    Special needs/care consideration ESRD on hemodialysis at [facility] She drove self to hemodialysis therefore transportation needs to be addressed prior to discharge home   Previous Home Environment  Living Arrangements: Alone  Lives With: Alone Available Help at Discharge: Family, Available 24 hours/day (28 year old Mom coming to assist , sister and family can assist) Type of Home: House Home Layout: One level Home Access: Ramped entrance Bathroom Shower/Tub: Chiropodist: Standard Bathroom  Accessibility: Yes How Accessible: Accessible via walker Home Care Services: No Additional Comments: having bathroom redone  Discharge Living Setting Plans for Discharge Living Setting: Patient's home, Alone (8 year old Mom coming from [place] and local sister to assist) Type of Home at Discharge: House Discharge Home Layout: One level Discharge Home Access: [place] entrance Discharge Bathroom Shower/Tub: Tub/shower unit Discharge Bathroom Toilet: Standard Discharge Bathroom Accessibility: Yes How Accessible: Accessible via walker Does the patient have any problems obtaining your medications?: No  Social/Family/Support Systems Contact Information: sister, Ivin Booty and niece Mathews Argyle Anticipated Caregiver: Mom , sister and family Anticipated Caregiver's Contact Information: see contacts Ability/Limitations of Caregiver: Mom is 79 year old can provide supervision, SIster workd T, W, [place] as a CNA, neices can asisst also Caregiver Availability: 24/7 Discharge Plan Discussed with Primary Caregiver: Yes Is Caregiver In Agreement with Plan?: Yes Does Caregiver/Family have Issues with Lodging/Transportation while Pt is in Rehab?: No  Goals Patient/Family Goal for Rehab: supervision slideboard transfers and wheelchair mobility, min assist OT. Wheelchair level goals Expected length of stay: ELOS 14 to 20 days Pt/Family Agrees to Admission and willing to participate: Yes Program Orientation Provided & Reviewed with Pt/Caregiver Including Roles  & Responsibilities: Yes  Decrease burden of Care through IP rehab admission: n/a  Possible need for SNF placement upon discharge: not anticipated  Patient Condition: I have reviewed medical records from [facility], spoken with CM, and patient and family member. I met with patient at the bedside for inpatient rehabilitation assessment.  Patient will benefit from ongoing PT and OT, can actively participate in 3 hours of therapy a day 5 days of  the week, and can make measurable gains during the admission.  Patient will also benefit from the coordinated team approach during an Inpatient Acute Rehabilitation admission.  The patient will receive intensive therapy as well as Rehabilitation physician, nursing, social worker, and care management interventions.  Due to bladder management, bowel management, safety, skin/wound care, disease management, medication administration, pain management, and patient education the patient requires 24 hour a day rehabilitation nursing.  The patient is currently mod to max assist overall with mobility and basic ADLs.  Discharge setting and therapy post discharge at home with home health is anticipated.  Patient has agreed to participate in the Acute Inpatient Rehabilitation Program and will admit Saturday 03/25/2022.  Preadmission Screen Completed By:  Cleatrice Burke, 03/24/2022 4:06 PM ______________________________________________________________________   Discussed status with Dr. Letta Pate on 03/24/22 at 1535 and received approval for admission Saturday 03/25/22.  Admission Coordinator:  Cleatrice Burke, RN, time 1619/Date 03/24/22   Assessment/Plan: Diagnosis:Right MCA embolic infarct Does the need for close, 24 hr/day Medical supervision  in concert with the patient's rehab needs make it unreasonable for this patient to be served in a less intensive setting? Yes Co-Morbidities requiring supervision/potential complications: BIlateral BKA, HTN, ESRD Due to bladder management, bowel management, safety, skin/wound care, disease management, medication administration, pain management, and patient education, does the patient require 24 hr/day rehab nursing? Yes Does the patient require coordinated care of a physician, rehab nurse, PT, OT, and SLP to address physical and functional deficits in the context of the above medical diagnosis(es)? Yes Addressing deficits in the following areas: balance,  endurance, locomotion, strength, transferring, bowel/bladder control, bathing, dressing, feeding, grooming, toileting, cognition, swallowing, and psychosocial support Can the patient actively participate in an intensive therapy program of at least 3 hrs of therapy 5 days a week? Yes The potential for patient to make measurable gains while on inpatient rehab is good Anticipated functional outcomes upon discharge from inpatient rehab: supervision PT, supervision OT, supervision SLP Estimated rehab length of stay to reach the above functional goals is: 14-20d Anticipated discharge destination: Home 10. Overall Rehab/Functional Prognosis: good   MD Signature: Charlett Blake M.D. [facility] Fellow Am Acad of Phys Med and Rehab Diplomate Am Board of Electrodiagnostic Med Fellow Am Board of Interventional Pain

## 2022-03-24 NOTE — Progress Notes (Signed)
Occupational Therapy Treatment Patient Details Name: Cindy Osborne MRN: [id number] DOB: [birth year] Today's Date: 03/24/2022   History of present illness Pt is a 56 y.o. female who presented 03/21/22 with acute onset L-sided weakness and hemineglect. Imaging revealed R MCA infarct. PMH includes: Bil BKA, DM, HLD, HTN, recent admission for R MCA CVA s/p thrombectomy June 2023   OT comments  Pt making incremental progress with OT goals. This session, pt worked on following simple commands, bed mobility, sitting balance, and attempting to stand. She followed approximately 75% of simple commands with out cuing this session, however continues to have decreased attention, difficulty with sequencing and initiation, as well as decreased awareness. Pt requiring mod A +2 for all bed mobility, as well as min guard to min A for sitting balance support. Attempted to stand x3 however pt unable to properly sequence or use BLE to power up. Recommending AIR to maximize independence and safety. OT will follow acutely.    Recommendations for follow up therapy are one component of a multi-disciplinary discharge planning process, led by the attending physician.  Recommendations may be updated based on patient status, additional functional criteria and insurance authorization.    Follow Up Recommendations  Acute inpatient rehab (3hours/day)    Assistance Recommended at Discharge Frequent or constant Supervision/Assistance  Patient can return home with the following  Assistance with cooking/housework;Assist for transportation;Help with stairs or ramp for entrance;Two people to help with walking and/or transfers;Two people to help with bathing/dressing/bathroom;Direct supervision/assist for medications management   Equipment Recommendations  None recommended by OT    Recommendations for Other Services Rehab consult    Precautions / Restrictions Precautions Precautions: Fall;Other (comment) Precaution Comments:  prior bil BKA (family bringing prosthetics) Restrictions Weight Bearing Restrictions: No       Mobility Bed Mobility Overal bed mobility: Needs Assistance Bed Mobility: Supine to Sit, Sit to Supine, Rolling Rolling: Max assist, Total assist   Supine to sit: Max assist, +2 for physical assistance, +2 for safety/equipment Sit to supine: Max assist, +2 for physical assistance, +2 for safety/equipment   General bed mobility comments: Max A +2 for all Aspects of bed mobility, except rolling to the R, where she requires total A +2    Transfers                   General transfer comment: Attempted, however unable to initiate with out total A +2.     Balance Overall balance assessment: Needs assistance Sitting-balance support: Single extremity supported, Feet unsupported Sitting balance-Leahy Scale: Poor Sitting balance - Comments: Pt with L lateral lean, needing cues to correct, min guard-minA for static sitting balance. Postural control: Left lateral lean                                 ADL either performed or assessed with clinical judgement   ADL Overall ADL's : Needs assistance/impaired                     Lower Body Dressing: Total assistance;+2 for physical assistance;+2 for safety/equipment;Bed level Lower Body Dressing Details (indicate cue type and reason): Unable to assist with donning prosthetics due to LUE flaccidity Toilet Transfer: Total assistance;+2 for physical assistance;+2 for safety/equipment;Transfer board Toilet Transfer Details (indicate cue type and reason): Attempted with lateral scoot, pt unable to complete Toileting- Clothing Manipulation and Hygiene: Total assistance;+2 for physical assistance;+2 for safety/equipment;Bed level Toileting - Clothing  Manipulation Details (indicate cue type and reason): Needed assist with rolling, unable to help with hygiene       General ADL Comments: Pt unable to don prosthetics without  total A    Extremity/Trunk Assessment Upper Extremity Assessment Upper Extremity Assessment: LUE deficits/detail LUE Deficits / Details: more movemnet distally, able to shrug shoulders but not raise arm, trace activiation noted at biceps with elbow flexion. Pt states sensation is WFL - will benefit from further assessment LUE Coordination: decreased fine motor;decreased gross motor            Vision   Additional Comments: Continued L field deficits   Perception     Praxis      Cognition Arousal/Alertness: Awake/alert Behavior During Therapy: Impulsive Overall Cognitive Status: Impaired/Different from baseline Area of Impairment: Attention, Following commands, Safety/judgement, Awareness, Problem solving                   Current Attention Level: Sustained   Following Commands: Follows one step commands with increased time Safety/Judgement: Decreased awareness of safety, Decreased awareness of deficits Awareness: Intellectual Problem Solving: Slow processing, Decreased initiation, Difficulty sequencing, Requires verbal cues, Requires tactile cues General Comments: Pt with poor attention span and difficulty following 2+ step commands. Requiring increased assist/cuing for safety as pt has poor insight to her deficits.        Exercises      Shoulder Instructions       General Comments VSS on RA    Pertinent Vitals/ Pain       Pain Assessment Pain Assessment: No/denies pain  Home Living                                          Prior Functioning/Environment              Frequency  Min 2X/week        Progress Toward Goals  OT Goals(current goals can now be found in the care plan section)  Progress towards OT goals: Progressing toward goals  Acute Rehab OT Goals Patient Stated Goal: To stand up OT Goal Formulation: With patient Time For Goal Achievement: 04/05/22 Potential to Achieve Goals: Good ADL Goals Pt Will Perform  Grooming: with set-up;sitting Pt Will Perform Upper Body Dressing: with set-up;sitting Pt Will Perform Lower Body Dressing: with mod assist;sitting/lateral leans Pt Will Transfer to Toilet: with mod assist;stand pivot transfer;bedside commode Additional ADL Goal #1: pt will indep locate at least 3 grooming supplies in her left environmetn to assist in grooming task  Plan Discharge plan remains appropriate;Frequency remains appropriate    Co-evaluation                 AM-PAC OT "6 Clicks" Daily Activity     Outcome Measure   Help from another person eating meals?: A Little Help from another person taking care of personal grooming?: A Little Help from another person toileting, which includes using toliet, bedpan, or urinal?: Total Help from another person bathing (including washing, rinsing, drying)?: A Lot Help from another person to put on and taking off regular upper body clothing?: A Lot Help from another person to put on and taking off regular lower body clothing?: Total 6 Click Score: 12    End of Session Equipment Utilized During Treatment: Gait belt  OT Visit Diagnosis: Unsteadiness on feet (R26.81);Other abnormalities of gait and mobility (R26.89);Muscle weakness (generalized) (  M62.81);Hemiplegia and hemiparesis;History of falling (Z91.81) Hemiplegia - Right/Left: Left Hemiplegia - dominant/non-dominant: Non-Dominant Hemiplegia - caused by: Cerebral infarction   Activity Tolerance Patient tolerated treatment well   Patient Left in bed;with call bell/phone within reach;with bed alarm set;with family/visitor present   Nurse Communication Mobility status        Time: 2527-1292 OT Time Calculation (min): 40 min  Charges: OT General Charges $OT Visit: 1 Visit OT Treatments $Self Care/Home Management : 23-37 mins $Therapeutic Activity: 8-22 mins  Dionel Archey H., OTR/L Acute Rehabilitation  Cindy Osborne Cindy Osborne 03/24/2022, 12:08 PM

## 2022-03-24 NOTE — Progress Notes (Signed)
[place] for  Heparin  Indication: atrial fibrillation Brief A/P: Heparin level subtherapeutic. Increase Heparin rate   Allergies  Allergen Reactions   Augmentin [Amoxicillin-Pot Clavulanate] Shortness Of Breath   Atorvastatin Other (See Comments)    Back pain   Pioglitazone Other (See Comments)    Edema   Peanut-Containing Drug Products Itching   Rosuvastatin Itching and Other (See Comments)    Back pain   Shellfish-Derived Products Itching and Rash    shrimp   Sulfa Antibiotics Rash and Other (See Comments)    Shedding of skin    Patient Measurements: Height: 5\' 9"  (175.3 cm) (recorded on 03/20/22) Weight: 106.8 kg (235 lb 7.2 oz) IBW/kg (Calculated) : 66.2 Heparin Dosing Weight: 90 kg  Vital Signs: Temp: 98 F (36.7 C) (08/10 2254) Temp Source: Oral (08/10 2254) BP: 172/60 (08/10 2254) Pulse Rate: 88 (08/10 2254)  Labs: Recent Labs    03/21/22 1320 03/21/22 1322 03/24/22 0017  HGB 12.8 13.9  --   HCT 39.6 41.0  --   PLT 227  --   --   APTT 29  --   --   LABPROT 16.2*  --   --   INR 1.3*  --   --   HEPARINUNFRC  --   --  0.10*  CREATININE 10.25* 11.30*  --      Estimated Creatinine Clearance: 7.2 mL/min (A) (by C-G formula based on SCr of 11.3 mg/dL (H)).  Assessment: 56 y.o female admitted with CVA and Afib for heparin.  Goal of Therapy:  Heparin level 0.3-0.5 units/ml Monitor platelets by anticoagulation protocol: Yes   Plan:  Increase Heparin 1400 units/hr Check heparin level in 8 hours.   Phillis Knack, PharmD, BCPS

## 2022-03-24 NOTE — Progress Notes (Addendum)
Progress Note  Patient Name: Cindy Osborne Date of Encounter: 03/24/2022  [facility] Cardiologist: Dr Stanford Breed  Subjective   No CP or dyspnea  Inpatient Medications    Scheduled Meds:  amitriptyline  25 mg Oral QHS   Chlorhexidine Gluconate Cloth  6 each Topical Q0600   insulin aspart  0-20 Units Subcutaneous Q4H   pravastatin  40 mg Oral q1800   Continuous Infusions:  diltiazem (CARDIZEM) infusion 15 mg/hr (03/24/22 0617)   heparin 1,400 Units/hr (03/24/22 0617)   PRN Meds: acetaminophen **OR** acetaminophen (TYLENOL) oral liquid 160 mg/5 mL **OR** acetaminophen, iohexol, labetalol, ondansetron (ZOFRAN) IV, senna-docusate   Vital Signs    Vitals:   03/23/22 1600 03/23/22 2019 03/23/22 2254 03/24/22 0420  BP:  (!) 140/87 (!) 172/60 (!) 151/59  Pulse:  88 88 85  Resp:  16 18 18   Temp:  98.2 F (36.8 C) 98 F (36.7 C) 97.6 F (36.4 C)  TempSrc:  Oral Oral Oral  SpO2:    (!) 82%  Weight:      Height: 5\' 9"  (1.753 m)       Intake/Output Summary (Last 24 hours) at 03/24/2022 0924 Last data filed at 03/24/2022 0617 Gross per 24 hour  Intake 390.63 ml  Output 100 ml  Net 290.63 ml      03/23/2022   11:06 AM 03/22/2022    2:15 AM 03/21/2022    9:50 PM  Last 3 Weights  Weight (lbs) 235 lb 7.2 oz 223 lb 15.8 oz 229 lb 11.5 oz  Weight (kg) 106.8 kg 101.6 kg 104.2 kg      Telemetry    Atrial fibrillation converting to sinus tachycardia - Personally Reviewed   Physical Exam   GEN: No acute distress.   Neck: No JVD Cardiac: RRR Respiratory: Clear to auscultation bilaterally. GI: Soft, nontender, non-distended  MS: No edema; s/p bilateral BKA Neuro:  Nonfocal  Psych: Normal affect   Labs     Chemistry Recent Labs  Lab 03/21/22 1320 03/21/22 1322  NA 139 138  K 5.1 4.8  CL 99 103  CO2 22  --   GLUCOSE 193* 187*  BUN 71* 62*  CREATININE 10.25* 11.30*  CALCIUM 10.0  --   PROT 8.4*  --   ALBUMIN 3.7  --   AST 44*  --   ALT 18  --    ALKPHOS 151*  --   BILITOT 0.9  --   GFRNONAA 4*  --   ANIONGAP 18*  --     Lipids  Recent Labs  Lab 03/22/22 0243  CHOL 107  TRIG 84  HDL 35*  LDLCALC 55  CHOLHDL 3.1    Hematology Recent Labs  Lab 03/21/22 1320 03/21/22 1322  WBC 13.8*  --   RBC 4.57  --   HGB 12.8 13.9  HCT 39.6 41.0  MCV 86.7  --   MCH 28.0  --   MCHC 32.3  --   RDW 17.4*  --   PLT 227  --     Radiology    VAS [place] LOWER EXTREMITY VENOUS (DVT)  Result Date: 03/22/2022  Lower Venous DVT Study Patient Name:  ADELIE CROSWELL  Date of Exam:   03/22/2022 Medical Rec #: [id number]          Accession #:    [id number] Date of Birth: 1965/09/11           Patient Gender: F Patient Age:   56 years Exam Location:  [facility]  [facility] Procedure:      VAS [place] LOWER EXTREMITY VENOUS (DVT) Referring Phys: Cornelius Moras XU --------------------------------------------------------------------------------  Indications: Stroke.  Comparison Study: No previous exam noted. Performing Technologist: Bobetta Lime BS, RVT  Examination Guidelines: A complete evaluation includes B-mode imaging, spectral Doppler, color Doppler, and power Doppler as needed of all accessible portions of each vessel. Bilateral testing is considered an integral part of a complete examination. Limited examinations for reoccurring indications may be performed as noted. The reflux portion of the exam is performed with the patient in reverse Trendelenburg.  +---------+---------------+---------+-----------+----------+--------------+ RIGHT    CompressibilityPhasicitySpontaneityPropertiesThrombus Aging +---------+---------------+---------+-----------+----------+--------------+ CFV      Full           Yes      Yes                                 +---------+---------------+---------+-----------+----------+--------------+ SFJ      Full                                                         +---------+---------------+---------+-----------+----------+--------------+ FV Prox  Full                                                        +---------+---------------+---------+-----------+----------+--------------+ FV Mid   Full                                                        +---------+---------------+---------+-----------+----------+--------------+ FV DistalFull                                                        +---------+---------------+---------+-----------+----------+--------------+ PFV      Full                                                        +---------+---------------+---------+-----------+----------+--------------+ POP      Full           Yes      Yes                                 +---------+---------------+---------+-----------+----------+--------------+   Right Technical Findings: Not visualized segments include posterior tibial and peroneal veins due to BKA.  +---------+---------------+---------+-----------+----------+--------------+ LEFT     CompressibilityPhasicitySpontaneityPropertiesThrombus Aging +---------+---------------+---------+-----------+----------+--------------+ CFV      Full           Yes      Yes                                 +---------+---------------+---------+-----------+----------+--------------+  SFJ      Full                                                        +---------+---------------+---------+-----------+----------+--------------+ FV Prox  Full                                                        +---------+---------------+---------+-----------+----------+--------------+ FV Mid   Full                                                        +---------+---------------+---------+-----------+----------+--------------+ FV DistalFull                                                        +---------+---------------+---------+-----------+----------+--------------+ PFV      Full                                                         +---------+---------------+---------+-----------+----------+--------------+ POP      Full           Yes      Yes                                 +---------+---------------+---------+-----------+----------+--------------+   Left Technical Findings: Not visualized segments include posterior tibial and posterior tibial veins due to BKA.   Summary: BILATERAL: - No evidence of deep vein thrombosis seen in the lower extremities, bilaterally. -No evidence of popliteal cyst, bilaterally. RIGHT: - Right BKA  LEFT: - Left BKA  *See table(s) above for measurements and observations. Electronically signed by Harold Barban MD on 03/22/2022 at 11:21:57 PM.    Final       Patient Profile     56 year old female with past medical history of hypertension, hyperlipidemia, diabetes mellitus, peripheral vascular disease status post bilateral BKA, end-stage renal disease dialysis dependent, recent CVA for evaluation of atrial fibrillation/flutter. Echocardiogram June 2023 showed normal LV function, moderate left ventricular hypertrophy, grade 2 diastolic dysfunction, mild right ventricular enlargement, moderate left atrial enlargement, mild to moderate mitral regurgitation.  Patient recently discharged following acute MCA infarct requiring thrombolytic therapy and mechanical thrombectomy.  Patient was readmitted on August 8 with strokelike symptoms.  She underwent successful mechanical thrombectomy for high-grade partial occlusion of proximal M2 of the right MCA. She developed atrial fibrillation/flutter and cardiology asked to evaluate.    Assessment & Plan    1 paroxysmal atrial fibrillation-patient has converted to sinus rhythm.  Will discontinue Cardizem and instead treat with Toprol 200 mg daily which was her home dose.  Would discontinue heparin and begin apixaban 5 mg  twice daily when okay with neurology.  Would check TSH for completeness.  2 status post  CVA-neurology feels like transesophageal echocardiogram not indicated as source of CVA is likely atrial fibrillation.  Note she is status post mechanical thrombectomy.  Will leave antiplatelet therapy to neurology.  If she does need antiplatelet therapy would favor Plavix 75 mg daily and avoid aspirin if possible (would like to avoid triple therapy).  3 hypertension-blood pressure trending up.  Discontinue Cardizem and resume home dose of Toprol.  Follow blood pressure and adjust regimen as needed.  4 hyperlipidemia-continue statin.  She does have an allergy to Crestor and Lipitor.  5 end-stage renal disease-dialysis per nephrology.  6 left hip pain-patient complains of left hip and arm pain after recent fall.  Further evaluation/imaging as needed per primary service.  Cardiology will sign off.  Please call with questions.  We will arrange follow-up in our office 4 to 6 weeks after discharge with APP.   For questions or updates, please contact [place] Please consult [email or web address] for contact info under        Signed, Kirk Ruths, MD  03/24/2022, 9:24 AM

## 2022-03-24 NOTE — Progress Notes (Signed)
Physical Therapy Treatment Patient Details Name: Cindy Osborne MRN: [id number] DOB: 1966/07/12 Today's Date: 03/24/2022   History of Present Illness Pt is a 56 y.o. female who presented 03/21/22 with acute onset L-sided weakness and hemineglect. Imaging revealed R MCA infarct. PMH includes: Bil BKA, DM, HLD, HTN, recent admission for R MCA CVA s/p thrombectomy June 2023    PT Comments    Pt making good progress with sitting balance.  Unable to progress to standing with assist of 1 - pt balance improved but low initiation in R leg to assist.  Pt needs repeated multimodal cues to stay on task.  Improved strength in L LE.  Cont POC.     Recommendations for follow up therapy are one component of a multi-disciplinary discharge planning process, led by the attending physician.  Recommendations may be updated based on patient status, additional functional criteria and insurance authorization.  Follow Up Recommendations  Acute inpatient rehab (3hours/day)     Assistance Recommended at Discharge Frequent or constant Supervision/Assistance  Patient can return home with the following Assistance with cooking/housework;Direct supervision/assist for medications management;Direct supervision/assist for financial management;Assist for transportation;Help with stairs or ramp for entrance;Two people to help with walking and/or transfers;Two people to help with bathing/dressing/bathroom   Equipment Recommendations  Hospital bed;Other (comment) (hoyer; pending progress)    Recommendations for Other Services Rehab consult     Precautions / Restrictions Precautions Precautions: Fall;Other (comment) Precaution Comments: Bil BKA (chronic) Required Braces or Orthoses: Other Brace Other Brace: bil prosthetic Restrictions Weight Bearing Restrictions: No     Mobility  Bed Mobility Overal bed mobility: Needs Assistance Bed Mobility: Supine to Sit, Sit to Supine, Rolling Rolling: Mod assist   Supine to  sit: Mod assist Sit to supine: Mod assist   General bed mobility comments: Mod Multi-modal repeated cues for all; Rolling: cues to reach and assist with bed pad; Supine sit: cues to get hips to EOB and assist to lift trunk; Sit to supine: assist for legs and to control trunk    Transfers Overall transfer level: Needs assistance   Transfers: Bed to chair/wheelchair/BSC            Lateral/Scoot Transfers: Max assist General transfer comment: Attempted with STEDY and bed elevated - pt leaning forward but low initiation in R leg to stand.  Multiple attempts but unable with assist of 1.  Lateral Scooting at EOB: Max cues, mod A with bed pads, cues to coordinate movement with therapist efforts    Ambulation/Gait                   Stairs             Wheelchair Mobility    Modified Rankin (Stroke Patients Only) Modified Rankin (Stroke Patients Only) Pre-Morbid Rankin Score: Slight disability Modified Rankin: Severe disability     Balance Overall balance assessment: Needs assistance Sitting-balance support: Single extremity supported, Feet supported, No upper extremity supported Sitting balance-Leahy Scale: Poor Sitting balance - Comments: Pt started with R UE support but was able to progress to close stand by without UE support.  Worked on sitting and reaching forward to tray (pt's food arrived and she ate several bites with therapist).  Pt also was able to reach backward and turn to right but required UE support.  During lateral scooting required min guard.  Cognition Arousal/Alertness: Awake/alert Behavior During Therapy: Impulsive Overall Cognitive Status: Impaired/Different from baseline Area of Impairment: Attention, Following commands, Safety/judgement, Awareness, Problem solving                   Current Attention Level: Sustained   Following Commands: Follows one step commands with increased  time Safety/Judgement: Decreased awareness of safety, Decreased awareness of deficits Awareness: Emergent Problem Solving: Slow processing, Decreased initiation, Difficulty sequencing, Requires verbal cues, Requires tactile cues General Comments: Pt with poor attention span and difficulty following 2+ step commands. Requiring increased assist/cuing for safety as pt has poor insight to her deficits. Difficulty with initiation with standing        Exercises General Exercises - Lower Extremity Long Arc Quad: AROM, Left, 10 reps, Supine    General Comments General comments (skin integrity, edema, etc.): VSS      Pertinent Vitals/Pain Pain Assessment Pain Assessment: No/denies pain    Home Living                          Prior Function            PT Goals (current goals can now be found in the care plan section) Progress towards PT goals: Progressing toward goals    Frequency    Min 4X/week      PT Plan Current plan remains appropriate    Co-evaluation              AM-PAC PT "6 Clicks" Mobility   Outcome Measure  Help needed turning from your back to your side while in a flat bed without using bedrails?: A Lot Help needed moving from lying on your back to sitting on the side of a flat bed without using bedrails?: A Lot Help needed moving to and from a bed to a chair (including a wheelchair)?: Total Help needed standing up from a chair using your arms (e.g., wheelchair or bedside chair)?: Total Help needed to walk in hospital room?: Total Help needed climbing 3-5 steps with a railing? : Total 6 Click Score: 8    End of Session Equipment Utilized During Treatment: Gait belt Activity Tolerance: Patient tolerated treatment well Patient left: in bed;with call bell/phone within reach;with bed alarm set;with family/visitor present Nurse Communication: Mobility status PT Visit Diagnosis: Unsteadiness on feet (R26.81);Muscle weakness (generalized)  (M62.81);Difficulty in walking, not elsewhere classified (R26.2);Pain;Hemiplegia and hemiparesis Hemiplegia - Right/Left: Left Hemiplegia - dominant/non-dominant: Non-dominant Hemiplegia - caused by: Cerebral infarction Pain - Right/Left: Left Pain - part of body: Arm;Hip     Time: 2993-7169 PT Time Calculation (min) (ACUTE ONLY): 50 min  Charges:  $Therapeutic Exercise: 8-22 mins $Therapeutic Activity: 8-22 mins $Neuromuscular Re-education: 23-37 mins                     Abran Richard, PT Acute Rehab [facility] [phone]    Karlton Lemon 03/24/2022, 1:16 PM

## 2022-03-25 ENCOUNTER — Inpatient Hospital Stay (HOSPITAL_COMMUNITY)
Admission: RE | Admit: 2022-03-25 | Discharge: 2022-04-10 | DRG: 056 | Disposition: A | Discharge status: Skilled Nursing Facility | Payer: Medicare Other | Source: Intra-hospital | Attending: Physical Medicine & Rehabilitation | Admitting: Physical Medicine & Rehabilitation

## 2022-03-25 ENCOUNTER — Other Ambulatory Visit: Payer: Self-pay

## 2022-03-25 ENCOUNTER — Encounter (HOSPITAL_COMMUNITY): Payer: Self-pay | Admitting: Physical Medicine & Rehabilitation

## 2022-03-25 DIAGNOSIS — N186 End stage renal disease: Secondary | ICD-10-CM

## 2022-03-25 DIAGNOSIS — G4733 Obstructive sleep apnea (adult) (pediatric): Secondary | ICD-10-CM | POA: Diagnosis present

## 2022-03-25 DIAGNOSIS — I739 Peripheral vascular disease, unspecified: Secondary | ICD-10-CM | POA: Diagnosis present

## 2022-03-25 DIAGNOSIS — R5381 Other malaise: Secondary | ICD-10-CM | POA: Diagnosis present

## 2022-03-25 DIAGNOSIS — I120 Hypertensive chronic kidney disease with stage 5 chronic kidney disease or end stage renal disease: Secondary | ICD-10-CM | POA: Diagnosis present

## 2022-03-25 DIAGNOSIS — Z794 Long term (current) use of insulin: Secondary | ICD-10-CM | POA: Diagnosis not present

## 2022-03-25 DIAGNOSIS — I480 Paroxysmal atrial fibrillation: Secondary | ICD-10-CM | POA: Diagnosis present

## 2022-03-25 DIAGNOSIS — I63511 Cerebral infarction due to unspecified occlusion or stenosis of right middle cerebral artery: Secondary | ICD-10-CM | POA: Diagnosis present

## 2022-03-25 DIAGNOSIS — I69391 Dysphagia following cerebral infarction: Secondary | ICD-10-CM

## 2022-03-25 DIAGNOSIS — M898X9 Other specified disorders of bone, unspecified site: Secondary | ICD-10-CM | POA: Diagnosis present

## 2022-03-25 DIAGNOSIS — Z833 Family history of diabetes mellitus: Secondary | ICD-10-CM

## 2022-03-25 DIAGNOSIS — Z91013 Allergy to seafood: Secondary | ICD-10-CM

## 2022-03-25 DIAGNOSIS — Z888 Allergy status to other drugs, medicaments and biological substances status: Secondary | ICD-10-CM

## 2022-03-25 DIAGNOSIS — I69312 Visuospatial deficit and spatial neglect following cerebral infarction: Secondary | ICD-10-CM | POA: Diagnosis not present

## 2022-03-25 DIAGNOSIS — E118 Type 2 diabetes mellitus with unspecified complications: Secondary | ICD-10-CM | POA: Diagnosis present

## 2022-03-25 DIAGNOSIS — E669 Obesity, unspecified: Secondary | ICD-10-CM | POA: Diagnosis present

## 2022-03-25 DIAGNOSIS — L723 Sebaceous cyst: Secondary | ICD-10-CM | POA: Diagnosis present

## 2022-03-25 DIAGNOSIS — E041 Nontoxic single thyroid nodule: Secondary | ICD-10-CM | POA: Diagnosis present

## 2022-03-25 DIAGNOSIS — Z89512 Acquired absence of left leg below knee: Secondary | ICD-10-CM | POA: Diagnosis not present

## 2022-03-25 DIAGNOSIS — E1151 Type 2 diabetes mellitus with diabetic peripheral angiopathy without gangrene: Secondary | ICD-10-CM | POA: Diagnosis present

## 2022-03-25 DIAGNOSIS — W19XXXD Unspecified fall, subsequent encounter: Secondary | ICD-10-CM | POA: Diagnosis present

## 2022-03-25 DIAGNOSIS — I69354 Hemiplegia and hemiparesis following cerebral infarction affecting left non-dominant side: Secondary | ICD-10-CM | POA: Diagnosis present

## 2022-03-25 DIAGNOSIS — Z89511 Acquired absence of right leg below knee: Secondary | ICD-10-CM

## 2022-03-25 DIAGNOSIS — Z992 Dependence on renal dialysis: Secondary | ICD-10-CM | POA: Diagnosis not present

## 2022-03-25 DIAGNOSIS — Z89421 Acquired absence of other right toe(s): Secondary | ICD-10-CM

## 2022-03-25 DIAGNOSIS — L89322 Pressure ulcer of left buttock, stage 2: Secondary | ICD-10-CM | POA: Diagnosis not present

## 2022-03-25 DIAGNOSIS — Z6833 Body mass index (BMI) 33.0-33.9, adult: Secondary | ICD-10-CM

## 2022-03-25 DIAGNOSIS — L8990 Pressure ulcer of unspecified site, unspecified stage: Secondary | ICD-10-CM | POA: Insufficient documentation

## 2022-03-25 DIAGNOSIS — M19012 Primary osteoarthritis, left shoulder: Secondary | ICD-10-CM | POA: Diagnosis present

## 2022-03-25 DIAGNOSIS — I63411 Cerebral infarction due to embolism of right middle cerebral artery: Secondary | ICD-10-CM | POA: Diagnosis not present

## 2022-03-25 DIAGNOSIS — Z91010 Allergy to peanuts: Secondary | ICD-10-CM

## 2022-03-25 DIAGNOSIS — G4700 Insomnia, unspecified: Secondary | ICD-10-CM | POA: Diagnosis present

## 2022-03-25 DIAGNOSIS — I69392 Facial weakness following cerebral infarction: Secondary | ICD-10-CM

## 2022-03-25 DIAGNOSIS — E11319 Type 2 diabetes mellitus with unspecified diabetic retinopathy without macular edema: Secondary | ICD-10-CM | POA: Diagnosis present

## 2022-03-25 DIAGNOSIS — Z881 Allergy status to other antibiotic agents status: Secondary | ICD-10-CM

## 2022-03-25 DIAGNOSIS — E1122 Type 2 diabetes mellitus with diabetic chronic kidney disease: Secondary | ICD-10-CM | POA: Diagnosis present

## 2022-03-25 DIAGNOSIS — E119 Type 2 diabetes mellitus without complications: Secondary | ICD-10-CM | POA: Diagnosis present

## 2022-03-25 DIAGNOSIS — K5901 Slow transit constipation: Secondary | ICD-10-CM | POA: Diagnosis present

## 2022-03-25 DIAGNOSIS — Z7982 Long term (current) use of aspirin: Secondary | ICD-10-CM

## 2022-03-25 DIAGNOSIS — Z7901 Long term (current) use of anticoagulants: Secondary | ICD-10-CM

## 2022-03-25 DIAGNOSIS — Z79899 Other long term (current) drug therapy: Secondary | ICD-10-CM

## 2022-03-25 DIAGNOSIS — I6601 Occlusion and stenosis of right middle cerebral artery: Secondary | ICD-10-CM | POA: Diagnosis not present

## 2022-03-25 DIAGNOSIS — D631 Anemia in chronic kidney disease: Secondary | ICD-10-CM | POA: Diagnosis present

## 2022-03-25 DIAGNOSIS — E785 Hyperlipidemia, unspecified: Secondary | ICD-10-CM | POA: Diagnosis present

## 2022-03-25 DIAGNOSIS — M7062 Trochanteric bursitis, left hip: Secondary | ICD-10-CM | POA: Diagnosis present

## 2022-03-25 DIAGNOSIS — E1169 Type 2 diabetes mellitus with other specified complication: Secondary | ICD-10-CM | POA: Diagnosis not present

## 2022-03-25 DIAGNOSIS — R1312 Dysphagia, oropharyngeal phase: Secondary | ICD-10-CM | POA: Diagnosis not present

## 2022-03-25 DIAGNOSIS — I69328 Other speech and language deficits following cerebral infarction: Secondary | ICD-10-CM | POA: Diagnosis not present

## 2022-03-25 DIAGNOSIS — Z882 Allergy status to sulfonamides status: Secondary | ICD-10-CM

## 2022-03-25 DIAGNOSIS — Z9049 Acquired absence of other specified parts of digestive tract: Secondary | ICD-10-CM

## 2022-03-25 HISTORY — DX: Cerebral infarction due to unspecified occlusion or stenosis of right middle cerebral artery: I63.511

## 2022-03-25 LAB — CBC
HCT: 36.3 % (ref 36.0–46.0)
Hemoglobin: 11.7 g/dL — ABNORMAL LOW (ref 12.0–15.0)
MCH: 27.9 pg (ref 26.0–34.0)
MCHC: 32.2 g/dL (ref 30.0–36.0)
MCV: 86.6 fL (ref 80.0–100.0)
Platelets: 234 10*3/uL (ref 150–400)
RBC: 4.19 MIL/uL (ref 3.87–5.11)
RDW: 17.5 % — ABNORMAL HIGH (ref 11.5–15.5)
WBC: 8.7 10*3/uL (ref 4.0–10.5)
nRBC: 0 % (ref 0.0–0.2)

## 2022-03-25 LAB — GLUCOSE, CAPILLARY
Glucose-Capillary: 95 mg/dL (ref 70–99)
Glucose-Capillary: 89 mg/dL (ref 70–99)

## 2022-03-25 LAB — TSH: TSH: 1.956 u[IU]/mL (ref 0.350–4.500)

## 2022-03-25 MED ORDER — METOPROLOL SUCCINATE ER 50 MG PO TB24
200.0000 mg | ORAL_TABLET | Freq: Every day | ORAL | Status: DC
  Administered 2022-03-26 – 2022-04-10 (×16): 200 mg via ORAL
  Filled 2022-03-25 (×18): qty 4

## 2022-03-25 MED ORDER — PROCHLORPERAZINE 25 MG RE SUPP: 12.5000 mg | Freq: Four times a day (QID) | RECTAL | Status: DC | PRN

## 2022-03-25 MED ORDER — APIXABAN 5 MG PO TABS
5.0000 mg | ORAL_TABLET | Freq: Two times a day (BID) | ORAL | Status: DC
Start: 2022-03-25 — End: 2022-06-08

## 2022-03-25 MED ORDER — ACETAMINOPHEN 650 MG RE SUPP: 650.0000 mg | RECTAL | 0 refills | Status: DC | PRN

## 2022-03-25 MED ORDER — POLYETHYLENE GLYCOL 3350 17 G PO PACK
17.0000 g | PACK | Freq: Every day | ORAL | Status: DC | PRN
  Administered 2022-03-29: 17 g via ORAL
  Filled 2022-03-25: qty 1

## 2022-03-25 MED ORDER — ORAL CARE MOUTH RINSE: 15.0000 mL | OROMUCOSAL | Status: DC | PRN

## 2022-03-25 MED ORDER — PRAVASTATIN SODIUM 40 MG PO TABS
40.0000 mg | ORAL_TABLET | Freq: Every day | ORAL | Status: DC
  Administered 2022-03-25 – 2022-04-10 (×17): 40 mg via ORAL
  Filled 2022-03-25 (×17): qty 1

## 2022-03-25 MED ORDER — BISACODYL 10 MG RE SUPP: 10.0000 mg | Freq: Every day | RECTAL | Status: DC | PRN

## 2022-03-25 MED ORDER — CINACALCET HCL 30 MG PO TABS
90.0000 mg | ORAL_TABLET | ORAL | Status: DC
  Filled 2022-03-25: qty 3

## 2022-03-25 MED ORDER — AMITRIPTYLINE HCL 25 MG PO TABS
25.0000 mg | ORAL_TABLET | Freq: Every day | ORAL | Status: DC
  Administered 2022-03-25 – 2022-04-10 (×17): 25 mg via ORAL
  Filled 2022-03-25 (×17): qty 1

## 2022-03-25 MED ORDER — PROCHLORPERAZINE MALEATE 5 MG PO TABS: 5.0000 mg | ORAL_TABLET | Freq: Four times a day (QID) | ORAL | Status: DC | PRN

## 2022-03-25 MED ORDER — INSULIN ASPART 100 UNIT/ML IJ SOLN: 0.0000 [IU] | INTRAMUSCULAR | 11 refills | Status: DC

## 2022-03-25 MED ORDER — ACETAMINOPHEN 325 MG PO TABS
650.0000 mg | ORAL_TABLET | ORAL | Status: DC | PRN
Start: 2022-03-25 — End: 2022-04-10

## 2022-03-25 MED ORDER — ACETAMINOPHEN 325 MG PO TABS
325.0000 mg | ORAL_TABLET | ORAL | Status: DC | PRN
  Administered 2022-03-30 – 2022-04-03 (×3): 650 mg via ORAL
  Filled 2022-03-25 (×4): qty 2

## 2022-03-25 MED ORDER — ACETAMINOPHEN 160 MG/5ML PO SOLN
650.0000 mg | ORAL | 0 refills | Status: DC | PRN
Start: 2022-03-25 — End: 2022-04-10

## 2022-03-25 MED ORDER — APIXABAN 5 MG PO TABS
5.0000 mg | ORAL_TABLET | Freq: Two times a day (BID) | ORAL | Status: DC
  Administered 2022-03-25 – 2022-04-10 (×33): 5 mg via ORAL
  Filled 2022-03-25 (×33): qty 1

## 2022-03-25 MED ORDER — ONDANSETRON HCL 4 MG/2ML IJ SOLN: 4.0000 mg | Freq: Four times a day (QID) | INTRAMUSCULAR | 0 refills | Status: DC | PRN

## 2022-03-25 MED ORDER — GUAIFENESIN-DM 100-10 MG/5ML PO SYRP: 5.0000 mL | ORAL_SOLUTION | Freq: Four times a day (QID) | ORAL | Status: DC | PRN

## 2022-03-25 MED ORDER — SENNOSIDES-DOCUSATE SODIUM 8.6-50 MG PO TABS
1.0000 | ORAL_TABLET | Freq: Every evening | ORAL | Status: DC | PRN
Start: 2022-03-25 — End: 2022-04-10

## 2022-03-25 MED ORDER — PROCHLORPERAZINE EDISYLATE 10 MG/2ML IJ SOLN: 5.0000 mg | Freq: Four times a day (QID) | INTRAMUSCULAR | Status: DC | PRN

## 2022-03-25 MED ORDER — DIPHENHYDRAMINE HCL 12.5 MG/5ML PO ELIX
12.5000 mg | ORAL_SOLUTION | Freq: Four times a day (QID) | ORAL | Status: DC | PRN
  Administered 2022-03-27: 12.5 mg via ORAL
  Administered 2022-04-02: 25 mg via ORAL
  Filled 2022-03-25 (×2): qty 10

## 2022-03-25 MED ORDER — CINACALCET HCL 30 MG PO TABS
90.0000 mg | ORAL_TABLET | ORAL | Status: DC
Start: 2022-03-25 — End: 2022-06-03

## 2022-03-25 NOTE — Progress Notes (Signed)
[facility] Progress Note   Subjective:   Pt seen on HD. Sleeping, awakens to voice. Denies SOB, CP, dizziness, nausea. Noted plans for CIR.   Objective Vitals:   03/25/22 0410 03/25/22 0720 03/25/22 0736 03/25/22 0739  BP: (!) 164/69 (!) 151/86 (!) 158/84   Pulse: 86 83 90   Resp:  14 (!) 23   Temp: 97.6 F (36.4 C) 98 F (36.7 C)    TempSrc: Axillary Oral    SpO2: 100% 96% 95%   Weight:    108.1 kg  Height:       Physical Exam General: Alert female in NAD Heart: RRR, no murmurs, rubs or gallops Lungs: CTA bilaterally without wheezing, rhonchi or rales Abdomen: Soft, non-distended, +BS Extremities: No edema b/l lower extremities Dialysis Access:  LUE AVF accessed  Additional Objective Labs: Basic Metabolic Panel: Recent Labs  Lab 03/21/22 1320 03/21/22 1322 03/21/22 1826 03/24/22 1019  NA 139 138  --  136  K 5.1 4.8  --  4.0  CL 99 103  --  93*  CO2 22  --   --  27  GLUCOSE 193* 187*  --  144*  BUN 71* 62*  --  30*  CREATININE 10.25* 11.30*  --  6.77*  CALCIUM 10.0  --   --  9.9  PHOS  --   --  6.0* 5.3*   Liver Function Tests: Recent Labs  Lab 03/21/22 1320 03/24/22 1019  AST 44*  --   ALT 18  --   ALKPHOS 151*  --   BILITOT 0.9  --   PROT 8.4*  --   ALBUMIN 3.7 3.2*   No results for input(s): "LIPASE", "AMYLASE" in the last 168 hours. CBC: Recent Labs  Lab 03/21/22 1320 03/21/22 1322 03/24/22 1019 03/25/22 0222  WBC 13.8*  --  8.9 8.7  NEUTROABS 11.8*  --   --   --   HGB 12.8 13.9 12.0 11.7*  HCT 39.6 41.0 37.4 36.3  MCV 86.7  --  86.8 86.6  PLT 227  --  217 234   Blood Culture No results found for: "SDES", "SPECREQUEST", "CULT", "REPTSTATUS"  Cardiac Enzymes: No results for input(s): "CKTOTAL", "CKMB", "CKMBINDEX", "TROPONINI" in the last 168 hours. CBG: Recent Labs  Lab 03/24/22 1154 03/24/22 1656 03/24/22 2055 03/24/22 2305 03/25/22 0344  GLUCAP 136* 141* 186* 140* 95   Iron Studies: No results for  input(s): "IRON", "TIBC", "TRANSFERRIN", "FERRITIN" in the last 72 hours. @lablastinr3 @ Studies/Results: No results found. Medications:   amitriptyline  25 mg Oral QHS   apixaban  5 mg Oral BID   Chlorhexidine Gluconate Cloth  6 each Topical Q0600   Chlorhexidine Gluconate Cloth  6 each Topical Q0600   insulin aspart  0-20 Units Subcutaneous Q4H   metoprolol succinate  200 mg Oral Daily   pravastatin  40 mg Oral q1800    Dialysis Orders: [place] [facility] ([facility] [phone])   4h  104.3kg  400/600  2/2.5 bath  Heparin 2000 LUA AVF - mircera 150 ug q2, last 8/5, last Hb 9.7 - calcitriol 0.5 ug tiw - sensipar 90 po tiw  Assessment/Plan: Acute CVA - sp intervention 8/8. Was here in June for similar event. Per stroke team. Now with PAF, cardiology on board. Planning for CIR Acute resp failure w/ hypoxemia -  Resolved, On RA this am.  Volume- didn't tolerate vol removal w/ HD. suspect CXR  may have been poor quality, vs neurogenic pulm edema not related  to vol overload.  ESRD - on HD TTS. Treatment was shortened Thursday due to a.fib RVR and nausea. Tolerating HD well so far today. HTN - BP running high. Metoprolol resumed per cardiology, expect improvement with UF today Anemia of CKD - Hgb 11.7.  Esa last given 8/5, no esa needs.  7.   MBD ckd - CCa high, VDRA on hold. Typically takes sensipar, will resume. Phos is at goal.    Anice Paganini, PA-C 03/25/2022, 8:10 AM  [facility] Pager: [phone]

## 2022-03-25 NOTE — Progress Notes (Signed)
Occupational Therapy Assessment and Plan  Patient Details  Name: Cindy Osborne MRN: [id number] Date of Birth: October 14, 1965  OT Diagnosis: abnormal posture, cognitive deficits, hemiplegia affecting non-dominant side, and muscle weakness (generalized) Rehab Potential: Rehab Potential (ACUTE ONLY): Good ELOS: 21-24 days   Today's Date: 03/25/2022 OT Individual Time: 6967-8938 OT Individual Time Calculation (min): 75 min     Hospital Problem: Principal Problem:   Acute ischemic right MCA stroke [facility])   Past Medical History:  Past Medical History:  Diagnosis Date   Amputated toe ([place]) 01/31/2012   Right second toe distal phalanx Right great toe   Diabetes mellitus without complication ([place])    History of diabetic ulcer of foot 04/01/2014   Hyperlipidemia    Hypertension    Stroke ([place]) 01/21/2022   Past Surgical History:  Past Surgical History:  Procedure Laterality Date   angioplasty politeal Left 07/2020   avg for dialysis     CHOLECYSTECTOMY     COLONOSCOPY  08/15/2011   cleared for 10 yrs   IR CT HEAD LTD  01/21/2022   IR CT HEAD LTD  03/21/2022   IR PERCUTANEOUS ART THROMBECTOMY/INFUSION INTRACRANIAL INC DIAG ANGIO  01/21/2022   IR PERCUTANEOUS ART THROMBECTOMY/INFUSION INTRACRANIAL INC DIAG ANGIO  03/21/2022   IR US GUIDE VASC ACCESS RIGHT  01/21/2022   IR US GUIDE VASC ACCESS RIGHT  03/21/2022   LEG AMPUTATION BELOW KNEE Right 04/2019   LEG AMPUTATION BELOW KNEE Left 09/08/2020   RADIOLOGY WITH ANESTHESIA N/A 01/21/2022   Procedure: IR WITH ANESTHESIA;  Surgeon: Radiologist, Medication, MD;  Location: [place];  Service: Radiology;  Laterality: N/A;   RADIOLOGY WITH ANESTHESIA N/A 03/21/2022   Procedure: IR WITH ANESTHESIA;  Surgeon: Radiologist, Medication, MD;  Location: [place];  Service: Radiology;  Laterality: N/A;   TOE AMPUTATION Right 10/15/2018    Assessment & Plan Clinical Impression: Patient is a 56 y.o. year old female with recent admission to the hospital on with  history of HTN, T2DM, B-BKA, CVA 6/23 who was admitted on 03/21/22 after fall with onset of left-sided weakness, left hemineglect and left facial droop with slurred speech. CTA head/Neck showed acute right M2/MCA posterior division occlusion with 21 mL ischemic penumbra and scattered opacities bilateral lung apices likely infectious in nature but follow-up CT chest recommended due to nodular appearance.  She underwent cerebral angio with mechanical thrombectomy of partial occlusions of 2 out of 3 MCA trifurcation with complete recanalization by Dr. Gerhard Perches.  MRI brain done revealing moderate-sized patchy acute ischemic right MCA infarct affecting the right frontal and parietal lobes with associated petechial hemorrhage and few scattered chronic ischemic infarcts.   2 D echo of 06/23 showed EF 55 to 60% with moderate concentric LVH, moderately dilated left atrium and grade 2 DD.  BLE Dopplers were negative for DVT.  Stroke felt to be cardioembolic with plans for cardiac work-up however she developed A-fib with RVR on 08/10.  She was started on IV Cardizem and Dr. Stanford Breed recommended apixaban twice daily.  She converted to NSR and home dose Toprol resumed.  TSH ordered for work-up.  Hemodialysis has been ongoing and she is tolerating this without difficulty.  Dysphagia 2 diet with thin liquids recommended  Patient transferred to CIR on 03/25/2022 .    Patient currently requires max with basic self-care skills secondary to muscle weakness, decreased cardiorespiratoy endurance, impaired timing and sequencing, abnormal tone, unbalanced muscle activation, decreased coordination, and decreased motor planning, decreased attention to left, left side neglect, and  decreased motor planning, decreased initiation, decreased attention, decreased awareness, decreased problem solving, decreased safety awareness, and delayed processing, and decreased sitting balance, decreased standing balance, decreased postural control,  hemiplegia, and decreased balance strategies.  Prior to hospitalization, patient could complete BADL with modified independent .  Patient will benefit from skilled intervention to increase independence with basic self-care skills prior to discharge home with care partner.  Anticipate patient will require minimal physical assistance and follow up home health.  OT - End of Session Endurance Deficit: Yes Endurance Deficit Description: Rest breaks during BADL tasks OT Assessment Rehab Potential (ACUTE ONLY): Good OT Barriers to Discharge: Other (comments) OT Barriers to Discharge Comments: Bilateral amputee, lives alone, but should have family support OT Patient demonstrates impairments in the following area(s): Balance;Cognition;Endurance;Motor;Pain;Perception;Safety;Vision;Skin Integrity OT Basic ADL's Functional Problem(s): Eating;Grooming;Bathing;Toileting;Dressing OT Transfers Functional Problem(s): Toilet;Tub/Shower OT Additional Impairment(s): Fuctional Use of Upper Extremity OT Plan OT Intensity: Minimum of 1-2 x/day, 45 to 90 minutes OT Frequency: 5 out of 7 days OT Duration/Estimated Length of Stay: 21-24 days OT Treatment/Interventions: Balance/vestibular training;Cognitive remediation/compensation;Community reintegration;Discharge planning;Disease mangement/prevention;DME/adaptive equipment instruction;Functional electrical stimulation;Functional mobility training;Neuromuscular re-education;Pain management;Patient/family education;Psychosocial support;Self Care/advanced ADL retraining;Skin care/wound managment;Splinting/orthotics;Therapeutic Activities;Therapeutic Exercise;UE/LE Strength taining/ROM;UE/LE Coordination activities;Visual/perceptual remediation/compensation;Wheelchair propulsion/positioning OT Self Feeding Anticipated Outcome(s): set-up A OT Basic Self-Care Anticipated Outcome(s): CGA OT Toileting Anticipated Outcome(s): CGA OT Bathroom Transfers Anticipated Outcome(s):  CGA OT Recommendation Recommendations for Other Services: Therapeutic Recreation consult Therapeutic Recreation Interventions: Stress management Patient destination: Home Follow Up Recommendations: Home health OT Equipment Recommended: To be determined   OT Evaluation Precautions/Restrictions  Precautions Precautions: Fall;Other (comment) Precaution Comments: Bil BKA 2 years ago and 1.5 years ago Required Braces or Orthoses: Other Brace Other Brace: bil prosthetic Restrictions Weight Bearing Restrictions: No Pain  Pt reports pain in L hip, repositioning and massage for pain management. Home Living/Prior Functioning Home Living Family/patient expects to be discharged to:: Private residence Living Arrangements: Alone Available Help at Discharge: Family, Available 24 hours/day (Mom and sister very supportive) Type of Home: House Home Access: Ramped entrance Home Layout: One level Bathroom Shower/Tub: [employer] social research officer Accessibility: Yes Additional Comments: having bathroom redone, has a wide bariatric BSC, cane, tub transfer bench  Lives With: Alone IADL History Homemaking Responsibilities: Yes IADL Comments: Driving, independent with grocery shopping, cooking from wc level, stated she could not stand for too long, but was walking well with a cane Prior Function Level of Independence: Independent with basic ADLs, Independent with homemaking with ambulation, Requires assistive device for independence Vision Baseline Vision/History: 0 No visual deficits Ability to See in Adequate Light: 0 Adequate Eye Alignment: Within Functional Limits Ocular Range of Motion: Restricted on the left Alignment/Gaze Preference: Gaze right Tracking/Visual Pursuits: Impaired - to be further tested in functional context Visual Fields: Left visual field deficit Perception  Perception: Impaired Inattention/Neglect: Does not attend to left visual field;Does not  attend to left side of body Praxis Praxis: Impaired Praxis Impairment Details: Motor planning Cognition Cognition Overall Cognitive Status: Impaired/Different from baseline Arousal/Alertness: Awake/alert Orientation Level: Person;Place;Situation Person: Oriented Place: Oriented Situation: Oriented Memory: Impaired Awareness: Impaired Problem Solving: Impaired Behaviors: Impulsive Safety/Judgment: Impaired Brief Interview for Mental Status (BIMS) Repetition of Three Words (First Attempt): 3 Temporal Orientation: Year: Correct Temporal Orientation: Month: Accurate within 5 days Temporal Orientation: Day: Correct Recall: "Sock": Yes, no cue required Recall: "Blue": Yes, no cue required Recall: "Bed": Yes, after cueing ("a piece of furniture") BIMS Summary Score: 14 Sensation Sensation Light Touch: Appears Intact Coordination Gross  Motor Movements are Fluid and Coordinated: No Fine Motor Movements are Fluid and Coordinated: No Coordination and Movement Description: Impaired L fine motor, able to flex fingers slightly  but not functional yet Motor  Motor Motor: Hemiplegia  Balance Balance Balance Assessed: Yes Static Sitting Balance Static Sitting - Balance Support: Right upper extremity supported Static Sitting - Level of Assistance: 5: Stand by assistance Dynamic Sitting Balance Dynamic Sitting - Balance Support: Right upper extremity supported Dynamic Sitting - Level of Assistance: 4: Min assist Extremity/Trunk Assessment RUE Assessment RUE Assessment: Within Functional Limits LUE Assessment LUE Assessment: Exceptions to [facility] General Strength Comments: synergy developing LUE Body System: Neuro Brunstrum levels for arm and hand: Arm;Hand Brunstrum level for arm: Stage II Synergy is developing Brunstrum level for hand: Stage II Synergy is developing  Care Tool Care Tool Self Care Eating   Eating Assist Level: Minimal Assistance - Patient > 75%    Oral Care     Oral Care Assist Level: Minimal Assistance - Patient > 75%    Bathing   Body parts bathed by patient: Left arm;Chest;Abdomen;Right upper leg;Face Body parts bathed by helper: Front perineal area;Buttocks;Right arm Body parts n/a: Left lower leg;Right lower leg Assist Level: Maximal Assistance - Patient 24 - 49%    Upper Body Dressing(including orthotics)   What is the patient wearing?: Pull over shirt   Assist Level: Maximal Assistance - Patient 25 - 49%    Lower Body Dressing (excluding footwear)   What is the patient wearing?: Pants Assist for lower body dressing: Dependent - Patient 0%    Putting on/Taking off footwear Putting on/taking off footwear activity did not occur: Safety/medical concerns (B BKA)           Care Tool Toileting Toileting activity   Assist for toileting: Dependent - Patient 0%     Care Tool Bed Mobility Roll left and right activity   Roll left and right assist level: Moderate Assistance - Patient 50 - 74%    Sit to lying activity   Sit to lying assist level: Maximal Assistance - Patient 25 - 49%    Lying to sitting on side of bed activity   Lying to sitting on side of bed assist level: the ability to move from lying on the back to sitting on the side of the bed with no back support.: Maximal Assistance - Patient 25 - 49%     Care Tool Transfers Sit to stand transfer Sit to stand activity did not occur: Safety/medical concerns      Chair/bed transfer Chair/bed transfer activity did not occur: Safety/medical concerns       Toilet transfer Toilet transfer activity did not occur: Safety/medical concerns       Care Tool Cognition  Expression of Ideas and Wants Expression of Ideas and Wants: 4. Without difficulty (complex and basic) - expresses complex messages without difficulty and with speech that is clear and easy to understand  Understanding Verbal and Non-Verbal Content Understanding Verbal and Non-Verbal Content: 4. Understands (complex  and basic) - clear comprehension without cues or repetitions   Memory/Recall Ability Memory/Recall Ability : Current season;Staff names and faces;Location of own room;That he or she is in a hospital/hospital unit   Refer to Care Plan for [place] 1 OT Short Term Goal 1 (Week 1): Patient willl perform toilet transfer with max A of 1 OT Short Term Goal 2 (Week 1): Patient demonstrated impoved awareness of L UE by positioning  prior to transfer with min verbal cues. OT Short Term Goal 3 (Week 1): Patient will complete maintain sitting balance at EOB with close supervision  Recommendations for other services: Therapeutic Recreation  Stress management   Skilled Therapeutic Intervention OT eval completed addressing rehab process, OT purpose, POC, ELOS, and goals. Bed level LB BADLs completed with overall max A with pt easily distracted and requiring cues to maintain attention to task. She has synergy developing in L hand, but unable to use functionally. Descent movement from L hip and knee. Pt with poor insight into deficits, but very motivated to return to PLOF. Pt sat EOB for UB BADLs with min A for balance progressing to close supervision at times. We did not don prothetics today and felt transfer was unsafe to trial due to lethargy and only having one person to assist. Pt left upright in bed with lunch tray set-up and family present. See blow for further details regarding BADL performance.    ADL ADL Eating: Minimal assistance Grooming: Minimal assistance Upper Body Bathing: Maximal assistance Lower Body Bathing: Dependent Upper Body Dressing: Maximal assistance Lower Body Dressing: Dependent Toileting: Dependent Toilet Transfer: Unable to assess Mobility  Bed Mobility Bed Mobility: Rolling Right;Rolling Left;Supine to Sit;Sit to Supine Rolling Right: Minimal Assistance - Patient > 75% Rolling Left: Maximal Assistance - Patient 25-49% Supine to Sit: Maximal  Assistance - Patient - Patient 25-49% Sit to Supine: Maximal Assistance - Patient 25-49%   Discharge Criteria: Patient will be discharged from OT if patient refuses treatment 3 consecutive times without medical reason, if treatment goals not met, if there is a change in medical status, if patient makes no progress towards goals or if patient is discharged from hospital.  The above assessment, treatment plan, treatment alternatives and goals were discussed and mutually agreed upon: by patient and by family  [name] 03/25/2022, 5:12 PM

## 2022-03-25 NOTE — Progress Notes (Signed)
Received patient in bed to unit.  Alert and oriented.  Informed consent signed and in  chart.   Treatment initiated: 0720 Treatment completed: 1134  Patient tolerated well.  Transported back to the room  alert, without acute distress.  Hand-off given to patient's nurse.   Access used: Av graft Access issues: none  Total UF removed: 500 Medication(s) given: none Post HD VS: 134/60,94,22,95%,97.8 Post HD weight: 106.4kg   [facility]

## 2022-03-25 NOTE — H&P (Signed)
Physical Medicine and Rehabilitation Admission H&P        Chief Complaint  Patient presents with   Functional deficits due to stroke   H/o B-BKA  : HPI:  Cindy Osborne is a 56 year old RH female with history of HTN, T2DM, B-BKA, CVA 6/23 who was admitted on 03/21/22 after fall with onset of left-sided weakness, left hemineglect and left facial droop with slurred speech. CTA head/Neck showed acute right M2/MCA posterior division occlusion with 21 mL ischemic penumbra and scattered opacities bilateral lung apices likely infectious in nature but follow-up CT chest recommended due to nodular appearance.  She underwent cerebral angio with mechanical thrombectomy of partial occlusions of 2 out of 3 MCA trifurcation with complete recanalization by Dr. Gerhard Perches.  MRI brain done revealing moderate-sized patchy acute ischemic right MCA infarct affecting the right frontal and parietal lobes with associated petechial hemorrhage and few scattered chronic ischemic infarcts.   2 D echo of 06/23 showed EF 55 to 60% with moderate concentric LVH, moderately dilated left atrium and grade 2 DD.  BLE Dopplers were negative for DVT.  Stroke felt to be cardioembolic with plans for cardiac work-up however she developed A-fib with RVR on 08/10.  She was started on IV Cardizem and Dr. Stanford Breed recommended apixaban twice daily.  She converted to NSR and home dose Toprol resumed.  TSH ordered for work-up.  Hemodialysis has been ongoing and she is tolerating this without difficulty.  Dysphagia 2 diet with thin liquids recommended     Patient with resultant left-sided weakness with impaired balance, impulsivity with decreased awareness of deficits as well as poor safety awareness.  CIR was recommended due to functional decline.  was using B BK prosthetics and cane PTA, BK amps 1.5-44yrs ago   Review of Systems  Constitutional:  Negative for chills and fever.  HENT:  Negative for hearing loss and tinnitus.    Gastrointestinal:  Positive for constipation. Negative for heartburn and nausea.  Musculoskeletal:  Negative for myalgias and neck pain.       Coccygeal pain.   Neurological:  Positive for speech change and weakness.  Psychiatric/Behavioral:  The patient has insomnia.           Past Medical History:  Diagnosis Date   Amputated toe ([place]) 01/31/2012    Right second toe distal phalanx Right great toe   Diabetes mellitus without complication ([place])     History of diabetic ulcer of foot 04/01/2014   Hyperlipidemia     Hypertension     Stroke ([place]) 01/21/2022           Past Surgical History:  Procedure Laterality Date   angioplasty politeal Left 07/2020   avg for dialysis       CHOLECYSTECTOMY       COLONOSCOPY   08/15/2011    cleared for 10 yrs   IR CT HEAD LTD   01/21/2022   IR CT HEAD LTD   03/21/2022   IR PERCUTANEOUS ART THROMBECTOMY/INFUSION INTRACRANIAL INC DIAG ANGIO   01/21/2022   IR PERCUTANEOUS ART THROMBECTOMY/INFUSION INTRACRANIAL INC DIAG ANGIO   03/21/2022   IR US GUIDE VASC ACCESS RIGHT   01/21/2022   IR US GUIDE VASC ACCESS RIGHT   03/21/2022   LEG AMPUTATION BELOW KNEE Right 04/2019   LEG AMPUTATION BELOW KNEE Left 09/08/2020   RADIOLOGY WITH ANESTHESIA N/A 01/21/2022    Procedure: IR WITH ANESTHESIA;  Surgeon: Radiologist, Medication, MD;  Location: [facility];  Service: Radiology;  Laterality: N/A;  RADIOLOGY WITH ANESTHESIA N/A 03/21/2022    Procedure: IR WITH ANESTHESIA;  Surgeon: Radiologist, Medication, MD;  Location: [facility];  Service: Radiology;  Laterality: N/A;   TOE AMPUTATION Right 10/15/2018           Family History  Problem Relation Age of Onset   Diabetes Mother     Diabetes Sister        Social History:  Divorced and lives alone. 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 She reports that she has never smoked. She has never used smokeless tobacco. She reports drinks a glass of wine per month. She reports that she  does not use drugs.          Allergies  Allergen Reactions   Augmentin [Amoxicillin-Pot Clavulanate] Shortness Of Breath   Atorvastatin Other (See Comments)      Back pain   Pioglitazone Other (See Comments)      Edema   Peanut-Containing Drug Products Itching   Rosuvastatin Itching and Other (See Comments)      Back pain   Shellfish-Derived Products Itching and Rash      shrimp   Sulfa Antibiotics Rash and Other (See Comments)      Shedding of skin            Medications Prior to Admission  Medication Sig Dispense Refill   amitriptyline (ELAVIL) 25 MG tablet Take 25 mg by mouth at bedtime.       amLODipine (NORVASC) 10 MG tablet Take 10 mg by mouth daily.       aspirin 81 MG chewable tablet Chew 1 tablet (81 mg total) by mouth daily. 90 tablet 0   fexofenadine (ALLEGRA) 180 MG tablet Take 180 mg by mouth daily.       Insulin Glargine (BASAGLAR KWIKPEN [place]) Inject 10-12 Units into the skin daily.       metoprolol (TOPROL-XL) 200 MG 24 hr tablet Take 1 tablet (200 mg total) by mouth daily. Take with or immediately following a meal. 30 tablet 0   montelukast (SINGULAIR) 10 MG tablet Take 1 tablet (10 mg total) by mouth at bedtime. (Patient taking differently: Take 10 mg by mouth as needed.) 30 tablet 0   pravastatin (PRAVACHOL) 40 MG tablet Take 1 tablet (40 mg total) by mouth daily at 6 PM. 90 tablet 0   sucroferric oxyhydroxide (VELPHORO) 500 MG chewable tablet Chew 500 mg by mouth 3 (three) times daily with meals.       torsemide (DEMADEX) 100 MG tablet Take 100 mg by mouth 2 (two) times daily.              Home: Home Living Family/patient expects to be discharged to:: Private residence Living Arrangements: Alone Available Help at Discharge: Family, Available 24 hours/day (95 year old Mom coming to assist , sister and family can assist) Type of Home: House Home Access: Ramped entrance [place]: One level Bathroom Shower/Tub: Chiropodist:  [place] Accessibility: Yes Home Equipment: Conservation officer, nature (2 wheels), [employer] - single point, Tub bench, Wheelchair - manual, Grab bars - tub/shower, BSC/3in1, Radiation protection practitioner Equipment: Long-handled sponge, Reacher Additional Comments: having bathroom redone  Lives With: Alone   Functional History: Prior Function Prior Level of Function : Independent/Modified Independent, Driving, History of Falls (last six months) Mobility Comments: uses a RW with her bil prosthetics ADLs Comments: pt reports full independence   Functional Status:  Mobility: Bed Mobility Overal  bed mobility: Needs Assistance Bed Mobility: Supine to Sit, Sit to Supine, Rolling Rolling: Mod assist Supine to sit: Mod assist Sit to supine: Mod assist General bed mobility comments: Mod Multi-modal repeated cues for all; Rolling: cues to reach and assist with bed pad; Supine sit: cues to get hips to EOB and assist to lift trunk; Sit to supine: assist for legs and to control trunk Transfers Overall transfer level: Needs assistance Transfers: Bed to chair/wheelchair/BSC Bed to/from chair/wheelchair/BSC transfer type:: Lateral/scoot transfer  Lateral/Scoot Transfers: Max assist General transfer comment: Attempted with STEDY and bed elevated - pt leaning forward but low initiation in R leg to stand.  Multiple attempts but unable with assist of 1.  Lateral Scooting at EOB: Max cues, mod A with bed pads, cues to coordinate movement with therapist efforts Ambulation/Gait General Gait Details: deferred, awaiting bil prosthetic devices   ADL: ADL Overall ADL's : Needs assistance/impaired Eating/Feeding: Set up, Sitting Grooming: Set up, Sitting Upper Body Bathing: Moderate assistance, Sitting Lower Body Bathing: Bed level, Maximal assistance Upper Body Dressing : Moderate assistance, Sitting Lower Body Dressing: Total assistance, +2 for physical assistance, +2 for safety/equipment, Bed level Lower Body  Dressing Details (indicate cue type and reason): Unable to assist with donning prosthetics due to LUE flaccidity Toilet Transfer: Total assistance, +2 for physical assistance, +2 for safety/equipment, Transfer board Toilet Transfer Details (indicate cue type and reason): Attempted with lateral scoot, pt unable to complete Toileting- Clothing Manipulation and Hygiene: Total assistance, +2 for physical assistance, +2 for safety/equipment, Bed level Toileting - Clothing Manipulation Details (indicate cue type and reason): Needed assist with rolling, unable to help with hygiene Functional mobility during ADLs: Total assistance, +2 for physical assistance, +2 for safety/equipment (bed level) General ADL Comments: Pt unable to don prosthetics without total A   Cognition: Cognition Overall Cognitive Status: Impaired/Different from baseline Arousal/Alertness: Awake/alert Orientation Level: Oriented X4 Cognition Arousal/Alertness: Awake/alert Behavior During Therapy: Impulsive Overall Cognitive Status: Impaired/Different from baseline Area of Impairment: Attention, Following commands, Safety/judgement, Awareness, Problem solving Current Attention Level: Sustained Following Commands: Follows one step commands with increased time Safety/Judgement: Decreased awareness of safety, Decreased awareness of deficits Awareness: Emergent Problem Solving: Slow processing, Decreased initiation, Difficulty sequencing, Requires verbal cues, Requires tactile cues General Comments: Pt with poor attention span and difficulty following 2+ step commands. Requiring increased assist/cuing for safety as pt has poor insight to her deficits. Difficulty with initiation with standing   Physical Exam: Blood pressure (!) 156/81, pulse 85, temperature 98.3 F (36.8 C), temperature source Oral, resp. rate 16, height 5\' 9"  (1.753 m), weight 106.8 kg, SpO2 96 %. Physical Exam Vitals and nursing note reviewed.  Constitutional:       Appearance: Normal appearance.  Musculoskeletal:     Comments: Old bilateral BKA incisions well healed without breakdown.   Neurological:     Mental Status: She is alert and oriented to person, place, and time.     Comments: Left facial weakness with left hemiplegia. Able to answer orientation questions without difficulty. Able to follow simple motor commands.       General: No acute distress Mood and affect are appropriate Heart: Regular rate and rhythm no rubs murmurs or extra sounds Lungs: Clear to auscultation, breathing unlabored, no rales or wheezes Abdomen: Positive bowel sounds, soft nontender to palpation, nondistended Extremities: No clubbing, cyanosis, or edema Skin: No evidence of breakdown, no evidence of rash Neurologic: RIgh tcentral 7, Left neglect on confrontation testing , motor strength is 5/5 in  RIght and 0/5 Left deltoid, bicep, tricep, grip, hip flexor, knee extensors, ankle dorsiflexor and plantar flexor Sensory exam absenst sensation to LT on LUE and LLE      Lab Results Last 48 Hours        Results for orders placed or performed during the hospital encounter of 03/21/22 (from the past 48 hour(s))  Glucose, capillary     Status: Abnormal    Collection Time: 03/22/22  4:42 PM  Result Value Ref Range    Glucose-Capillary 122 (H) 70 - 99 mg/dL      Comment: Glucose reference range applies only to samples taken after fasting for at least 8 hours.  Glucose, capillary     Status: Abnormal    Collection Time: 03/22/22  8:04 PM  Result Value Ref Range    Glucose-Capillary 195 (H) 70 - 99 mg/dL      Comment: Glucose reference range applies only to samples taken after fasting for at least 8 hours.  Glucose, capillary     Status: Abnormal    Collection Time: 03/22/22 11:12 PM  Result Value Ref Range    Glucose-Capillary 169 (H) 70 - 99 mg/dL      Comment: Glucose reference range applies only to samples taken after fasting for at least 8 hours.    Comment 1  Notify RN      Comment 2 Document in Chart    Glucose, capillary     Status: None    Collection Time: 03/23/22  3:28 AM  Result Value Ref Range    Glucose-Capillary 94 70 - 99 mg/dL      Comment: Glucose reference range applies only to samples taken after fasting for at least 8 hours.  Glucose, capillary     Status: Abnormal    Collection Time: 03/23/22  7:40 AM  Result Value Ref Range    Glucose-Capillary 321 (H) 70 - 99 mg/dL      Comment: Glucose reference range applies only to samples taken after fasting for at least 8 hours.  Glucose, capillary     Status: Abnormal    Collection Time: 03/23/22 11:52 AM  Result Value Ref Range    Glucose-Capillary 69 (L) 70 - 99 mg/dL      Comment: Glucose reference range applies only to samples taken after fasting for at least 8 hours.  Glucose, capillary     Status: Abnormal    Collection Time: 03/23/22 11:55 AM  Result Value Ref Range    Glucose-Capillary 55 (L) 70 - 99 mg/dL      Comment: Glucose reference range applies only to samples taken after fasting for at least 8 hours.  Glucose, capillary     Status: None    Collection Time: 03/23/22 12:07 PM  Result Value Ref Range    Glucose-Capillary 72 70 - 99 mg/dL      Comment: Glucose reference range applies only to samples taken after fasting for at least 8 hours.  Glucose, capillary     Status: None    Collection Time: 03/23/22  1:03 PM  Result Value Ref Range    Glucose-Capillary 79 70 - 99 mg/dL      Comment: Glucose reference range applies only to samples taken after fasting for at least 8 hours.  Glucose, capillary     Status: Abnormal    Collection Time: 03/23/22  4:36 PM  Result Value Ref Range    Glucose-Capillary 134 (H) 70 - 99 mg/dL      Comment: Glucose  reference range applies only to samples taken after fasting for at least 8 hours.  Glucose, capillary     Status: Abnormal    Collection Time: 03/23/22  9:26 PM  Result Value Ref Range    Glucose-Capillary 150 (H) 70 - 99  mg/dL      Comment: Glucose reference range applies only to samples taken after fasting for at least 8 hours.    Comment 1 Notify RN    Glucose, capillary     Status: Abnormal    Collection Time: 03/23/22 11:59 PM  Result Value Ref Range    Glucose-Capillary 120 (H) 70 - 99 mg/dL      Comment: Glucose reference range applies only to samples taken after fasting for at least 8 hours.  Heparin level (unfractionated)     Status: Abnormal    Collection Time: 03/24/22 12:17 AM  Result Value Ref Range    Heparin Unfractionated 0.10 (L) 0.30 - 0.70 IU/mL      Comment: (NOTE) The clinical reportable range upper limit is being lowered to >1.10 to align with the FDA approved guidance for the current laboratory assay.   If heparin results are below expected values, and patient dosage has  been confirmed, suggest follow up testing of antithrombin III levels. Performed at [facility], [place] [address] [place], [place] [address]    Glucose, capillary     Status: Abnormal    Collection Time: 03/24/22  4:17 AM  Result Value Ref Range    Glucose-Capillary 117 (H) 70 - 99 mg/dL      Comment: Glucose reference range applies only to samples taken after fasting for at least 8 hours.  Glucose, capillary     Status: Abnormal    Collection Time: 03/24/22  9:00 AM  Result Value Ref Range    Glucose-Capillary 129 (H) 70 - 99 mg/dL      Comment: Glucose reference range applies only to samples taken after fasting for at least 8 hours.  Heparin level (unfractionated)     Status: Abnormal    Collection Time: 03/24/22 10:19 AM  Result Value Ref Range    Heparin Unfractionated 0.21 (L) 0.30 - 0.70 IU/mL      Comment: (NOTE) The clinical reportable range upper limit is being lowered to >1.10 to align with the FDA approved guidance for the current laboratory assay.   If heparin results are below expected values, and patient dosage has  been confirmed, suggest follow up testing of antithrombin III  levels. Performed at [facility], [place] [address], [place], [place] [address]    CBC     Status: Abnormal    Collection Time: 03/24/22 10:19 AM  Result Value Ref Range    WBC 8.9 4.0 - 10.5 K/uL    RBC 4.31 3.87 - 5.11 MIL/uL    Hemoglobin 12.0 12.0 - 15.0 g/dL    HCT 37.4 36.0 - 46.0 %    MCV 86.8 80.0 - 100.0 fL    MCH 27.8 26.0 - 34.0 pg    MCHC 32.1 30.0 - 36.0 g/dL    RDW 17.3 (H) 11.5 - 15.5 %    Platelets 217 150 - 400 K/uL    nRBC 0.0 0.0 - 0.2 %      Comment: Performed at [facility], [place] [address] [place], [place] [address]  Renal function panel     Status: Abnormal    Collection Time: 03/24/22 10:19 AM  Result Value Ref Range  Sodium 136 135 - 145 mmol/L    Potassium 4.0 3.5 - 5.1 mmol/L    Chloride 93 (L) 98 - 111 mmol/L    CO2 27 22 - 32 mmol/L    Glucose, Bld 144 (H) 70 - 99 mg/dL      Comment: Glucose reference range applies only to samples taken after fasting for at least 8 hours.    BUN 30 (H) 6 - 20 mg/dL    Creatinine, Ser 6.77 (H) 0.44 - 1.00 mg/dL    Calcium 9.9 8.9 - 10.3 mg/dL    Phosphorus 5.3 (H) 2.5 - 4.6 mg/dL    Albumin 3.2 (L) 3.5 - 5.0 g/dL    GFR, Estimated 7 (L) >60 mL/min      Comment: (NOTE) Calculated using the CKD-EPI Creatinine Equation (2021)      Anion gap 16 (H) 5 - 15      Comment: Performed at [facility] [address] [place], [place] [address]  Glucose, capillary     Status: Abnormal    Collection Time: 03/24/22 11:54 AM  Result Value Ref Range    Glucose-Capillary 136 (H) 70 - 99 mg/dL      Comment: Glucose reference range applies only to samples taken after fasting for at least 8 hours.      Imaging Results (Last 48 hours)  No results found.         Blood pressure (!) 156/81, pulse 85, temperature 98.3 F (36.8 C), temperature source Oral, resp. rate 16, height 5\' 9"  (1.753 m), weight 106.8 kg, SpO2 96 %.   Medical Problem List and Plan: 1. Functional deficits secondary to RIght MCA  cardioembolic infarct              -patient may  shower             -ELOS/Goals: MinA mobility and ADL, use R BKA prothetic to transfer to [facility] , sliding board 2.  Antithrombotics: -DVT/anticoagulation:  Pharmaceutical: Eliquis             -antiplatelet therapy: N/A 3. Pain Management: Tylenol prn.  4. Mood/Behavior/Sleep: LCSW to follow for evaluation and support             -continue Elavil at bedtime for insomnia.              -antipsychotic agents: N/A 5. Neuropsych/cognition: This patient is capable of making decisions on her own behalf. 6. Skin/Wound Care: Routine pressure relief measures.  7. Fluids/Electrolytes/Nutrition: Strict I/O. Add 1200 cc FR/day.             --daily weights.  8. New onset A fib w/RVR: Monitor HR TID continue Eliquis. 9. T2DM: Hgb A1C- 6.0 and well controlled. Was on insulin Glargine 18 units with 5 units TID meal coverage.  --Monitor BS ac/hs. Use SSi for elevated BS 10. ESRD: HD TTS at the end of the day to help with tolerance of therapy during the day.  11. Small thyroid nodule: Follow up with PCP after discharge for further work up.  12.  Patchy/nodular chest densities: CT chest recommended for follow-up 13. OSA: Continue BIPAP when asleep.   85.  H/o bilateral BKA disabled from work as [place], PA-C 03/24/2022  "I have personally performed a face to face diagnostic evaluation of this patient.  Additionally, I have reviewed and concur with the physician assistant's documentation above." Charlett Blake M.D. [facility] Fellow Am Acad of Phys  Med and Rehab Diplomate Am Board of Electrodiagnostic Med Fellow Am Board of Interventional Pain

## 2022-03-25 NOTE — Plan of Care (Signed)
  Problem: RH Balance Goal: LTG: Patient will maintain dynamic sitting balance (OT) Description: LTG:  Patient will maintain dynamic sitting balance with assistance during activities of daily living (OT) Flowsheets (Taken 03/25/2022 1714) LTG: Pt will maintain dynamic sitting balance during ADLs with: Independent with assistive device   Problem: RH Eating Goal: LTG Patient will perform eating w/assist, cues/equip (OT) Description: LTG: Patient will perform eating with assist, with/without cues using equipment (OT) Flowsheets (Taken 03/25/2022 1714) LTG: Pt will perform eating with assistance level of: Independent with assistive device    Problem: RH Grooming Goal: LTG Patient will perform grooming w/assist,cues/equip (OT) Description: LTG: Patient will perform grooming with assist, with/without cues using equipment (OT) Flowsheets (Taken 03/25/2022 1714) LTG: Pt will perform grooming with assistance level of: Independent with assistive device    Problem: RH Bathing Goal: LTG Patient will bathe all body parts with assist levels (OT) Description: LTG: Patient will bathe all body parts with assist levels (OT) Flowsheets (Taken 03/25/2022 1714) LTG: Pt will perform bathing with assistance level/cueing: Contact Guard/Touching assist   Problem: RH Dressing Goal: LTG Patient will perform upper body dressing (OT) Description: LTG Patient will perform upper body dressing with assist, with/without cues (OT). Flowsheets (Taken 03/25/2022 1714) LTG: Pt will perform upper body dressing with assistance level of: Supervision/Verbal cueing Goal: LTG Patient will perform lower body dressing w/assist (OT) Description: LTG: Patient will perform lower body dressing with assist, with/without cues in positioning using equipment (OT) Flowsheets (Taken 03/25/2022 1714) LTG: Pt will perform lower body dressing with assistance level of: Contact Guard/Touching assist   Problem: RH Toileting Goal: LTG Patient will  perform toileting task (3/3 steps) with assistance level (OT) Description: LTG: Patient will perform toileting task (3/3 steps) with assistance level (OT)  Flowsheets (Taken 03/25/2022 1714) LTG: Pt will perform toileting task (3/3 steps) with assistance level: Contact Guard/Touching assist   Problem: RH Functional Use of Upper Extremity Goal: LTG Patient will use RT/LT upper extremity as a (OT) Description: LTG: Patient will use right/left upper extremity as a stabilizer/gross assist/diminished/nondominant/dominant level with assist, with/without cues during functional activity (OT) Flowsheets (Taken 03/25/2022 1714) LTG: Use of upper extremity in functional activities: LUE as a stabilizer   Problem: RH Toilet Transfers Goal: LTG Patient will perform toilet transfers w/assist (OT) Description: LTG: Patient will perform toilet transfers with assist, with/without cues using equipment (OT) Flowsheets (Taken 03/25/2022 1714) LTG: Pt will perform toilet transfers with assistance level of: Contact Guard/Touching assist   Problem: RH Tub/Shower Transfers Goal: LTG Patient will perform tub/shower transfers w/assist (OT) Description: LTG: Patient will perform tub/shower transfers with assist, with/without cues using equipment (OT) Flowsheets (Taken 03/25/2022 1714) LTG: Pt will perform tub/shower stall transfers with assistance level of: Contact Guard/Touching assist   Problem: RH Attention Goal: LTG Patient will demonstrate this level of attention during functional activites (OT) Description: LTG:  Patient will demonstrate this level of attention during functional activites  (OT) Flowsheets (Taken 03/25/2022 1714) Patient will demonstrate this level of attention during functional activites: Sustained Patient will demonstrate above attention level in the following environment: Home LTG: Patient will demonstrate this level of attention during functional activites (OT): Supervision

## 2022-03-25 NOTE — Progress Notes (Signed)
INPATIENT REHABILITATION ADMISSION NOTE   Arrival Method: bed     Mental Orientation:alert   Assessment:done   Skin:done  with Josh RN   IV'S: RT FA   Pain:none   Tubes and Drains:none   Safety Measures:done   Vital Signs:done   Height and Weight:done   Rehab Orientation:done   Family: In room  [employer] RNC,BSN, [employer]

## 2022-03-25 NOTE — Progress Notes (Signed)
Charlett Blake, MD  Physician Physical Medicine and Rehabilitation PMR Pre-admission    Signed Date of Service:  03/24/2022  4:06 PM  Related encounter: ED to Hosp-Admission (Discharged) from 03/21/2022 in [facility]   Signed       Show:Clear all '[x]' Written'[x]' Templated'[]' Copied  Added by: '[x]' Cristina Gong, RN'[x]' Kirsteins, Luanna Salk, MD  '[]' Hover for details                                                                                                                                                                                                                                                                                                                      PMR Admission Coordinator Pre-Admission Assessment   Patient: Cindy Osborne is an 56 y.o., female MRN: [id number] DOB: [birth year] Height: '5\' 9"'  (175.3 cm) (recorded on 03/20/22) Weight: 106.8 kg   Insurance Information HMO:     PPO:      PCP:      IPA:      80/20:      OTHER:  PRIMARY: Medicare a and b      Policy#: [id number]      Subscriber: pt Benefits:  Phone #: passport one source online     Name: 8/11 Eff. Date: 09/14/21     Deduct: $1600      Out of Pocket Max: none      Life Max: none CIR: 100%      SNF: 20 full days Outpatient: 80%     Co-Pay: 20% Home Health: 100%      Co-Pay: none DME: 80%     Co-Pay: 20% Providers: in network  SECONDARY: Roseanna Rainbow      Policy#: [id number]   Financial Counselor:       Phone#:    The "Data Collection Information Summary" for patients in Inpatient Rehabilitation Facilities with attached "Privacy Act [place] Records" was provided and verbally reviewed with: Patient and Family   Emergency Contact Information Contact Information  Name Relation Home Work [place] Sister     [phone]    Gay Filler Niece      [phone]         Current Medical History  Patient Admitting Diagnosis: CVA   History of Present Illness: 55 year old female with history of Bilateral LE amputations, ESRD on hemodialysis since 3/23, DM, HLD and HTN and right MCA CVA 6/23. Presented on 03/21/22  via EMS with acute onset of left facial droop, left sided weakness and left hemi neglect.    CTA head and neck with acute right M2/MCA posterior division occlusion. She was not a TNK candidate due to time. Found to have a partial occlusion of 2 branches of 3 vessels at MCA trifurcation and underwent mechanical thrombectomy with neurological improvement. Felt moderately sized patchy acute ischemic right MCA distribution infarcts . LDL 35 to continue on pravastatin. On 8/10 after hemodialysis with noted afib with RVR . Cardiology consulted. Began heparin Gtt and Cardizem drip . Transitioned to Eliquis and Metoprolol on 8/11. ESRD on hemodialysis with Nephrology consulted to maintain schedule. CVA felt due to Afib.    Complete NIHSS TOTAL: 11   Patient's medical record from [facility] has been reviewed by the rehabilitation admission coordinator and physician.   Past Medical History      Past Medical History:  Diagnosis Date   Amputated toe ([place]) 01/31/2012    Right second toe distal phalanx Right great toe   Diabetes mellitus without complication ([place])     History of diabetic ulcer of foot 04/01/2014   Hyperlipidemia     Hypertension     Stroke ([place]) 01/21/2022    Has the patient had major surgery during 100 days prior to admission? Yes   Family History   family history includes Diabetes in her mother and sister.   Current Medications   Current Facility-Administered Medications:    acetaminophen (TYLENOL) tablet 650 mg, 650 mg, Oral, Q4H PRN, 650 mg at 03/24/22 1247 **OR** acetaminophen (TYLENOL) 160 MG/5ML solution 650 mg, 650 mg, Per Tube, Q4H PRN **OR** acetaminophen (TYLENOL) suppository 650 mg, 650 mg,  Rectal, Q4H PRN, Kerney Elbe, MD, 650 mg at 03/22/22 0244   amitriptyline (ELAVIL) tablet 25 mg, 25 mg, Oral, QHS, Amie Portland, MD, 25 mg at 03/23/22 2128   apixaban (ELIQUIS) tablet 5 mg, 5 mg, Oral, BID, Reome, Earle J, RPH, 5 mg at 03/24/22 1428   Chlorhexidine Gluconate Cloth 2 % PADS 6 each, 6 each, Topical, Q0600, Roney Jaffe, MD, 6 each at 03/24/22 0502   Chlorhexidine Gluconate Cloth 2 % PADS 6 each, 6 each, Topical, Q0600, Janalee Dane, PA-C, 6 each at 03/24/22 1247   insulin aspart (novoLOG) injection 0-20 Units, 0-20 Units, Subcutaneous, Q4H, Kerney Elbe, MD, 3 Units at 03/24/22 1248   iohexol (OMNIPAQUE) 300 MG/ML solution 100 mL, 100 mL, Intra-arterial, Once PRN, Glori Bickers, MD   labetalol (NORMODYNE) injection 5-20 mg, 5-20 mg, Intravenous, Q2H PRN, [name], MD   metoprolol succinate (TOPROL-XL) 24 hr tablet 200 mg, 200 mg, Oral, Daily, Crenshaw, Denice Bors, MD, 200 mg at 03/24/22 1246   ondansetron (ZOFRAN) injection 4 mg, 4 mg, Intravenous, Q6H PRN, [name], MD   pravastatin (PRAVACHOL) tablet 40 mg, 40 mg, Oral, q1800, [name], MD, 40 mg at 03/23/22 1738   senna-docusate (Senokot-S) tablet 1 tablet, 1 tablet, Oral, QHS PRN, Kerney Elbe, MD   Patients Current Diet:  Diet Order  DIET DYS 2 Room service appropriate? Yes; Fluid consistency: Thin  Diet effective now                       Precautions / Restrictions Precautions Precautions: Fall, Other (comment) Precaution Comments: Bil BKA (chronic) Other Brace: bil prosthetic Restrictions Weight Bearing Restrictions: No    Has the patient had 2 or more falls or a fall with injury in the past year? No   Prior Activity Level Community (5-7x/wk): Mod I with bil LE prosthetics with cane or RW; drives self to OP dialysis   Prior Functional Level Self Care: Did the patient need help bathing, dressing, using the toilet or eating? Independent   Indoor Mobility: Did the  patient need assistance with walking from room to room (with or without device)? Independent   Stairs: Did the patient need assistance with internal or external stairs (with or without device)? Independent   Functional Cognition: Did the patient need help planning regular tasks such as shopping or remembering to take medications? Independent   Patient Information Are you of Hispanic, Latino/a,or Spanish origin?: A. No, not of Hispanic, Latino/a, or Spanish origin What is your race?: B. Black or African American Do you need or want an interpreter to communicate with a doctor or health care staff?: 0. No   Patient's Response To:  Health Literacy and Transportation Is the patient able to respond to health literacy and transportation needs?: Yes Health Literacy - How often do you need to have someone help you when you read instructions, pamphlets, or other written material from your doctor or pharmacy?: Never In the past 12 months, has lack of transportation kept you from medical appointments or from getting medications?: No In the past 12 months, has lack of transportation kept you from meetings, work, or from getting things needed for daily living?: No   Home Assistive Devices / [place] Devices/Equipment: Prosthesis Home Equipment: Conservation officer, nature (2 wheels), [employer] - single point, Tub bench, Wheelchair - manual, Grab bars - tub/shower, BSC/3in1, Adaptive equipment   Prior Device Use: Indicate devices/aids used by the patient prior to current illness, exacerbation or injury? Bilateral LE prosthesis and cane or RW for ambulation   Current Functional Level Cognition   Arousal/Alertness: Awake/alert Overall Cognitive Status: Impaired/Different from baseline Current Attention Level: Sustained Orientation Level: Oriented X4 Following Commands: Follows one step commands with increased time Safety/Judgement: Decreased awareness of safety, Decreased awareness of deficits General  Comments: Pt with poor attention span and difficulty following 2+ step commands. Requiring increased assist/cuing for safety as pt has poor insight to her deficits. Difficulty with initiation with standing    Extremity Assessment (includes Sensation/Coordination)   Upper Extremity Assessment: LUE deficits/detail RUE Deficits / Details: WFL RUE Coordination: WNL LUE Deficits / Details: more movemnet distally, able to shrug shoulders but not raise arm, trace activiation noted at biceps with elbow flexion. Pt states sensation is WFL - will benefit from further assessment LUE Coordination: decreased fine motor, decreased gross motor  Lower Extremity Assessment: Defer to PT evaluation LLE Deficits / Details: Grossly weakn compared to R, but able to lift against gravity and against some resistance (difficulty following cues to hold contraction against resistance); able to detect touch with accuracy, denied numbness/tingling LLE Sensation: WNL LLE Coordination: decreased gross motor     ADLs   Overall ADL's : Needs assistance/impaired Eating/Feeding: Set up, Sitting Grooming: Set up, Sitting Upper Body Bathing: Moderate assistance, Sitting Lower Body Bathing: Bed  level, Maximal assistance Upper Body Dressing : Moderate assistance, Sitting Lower Body Dressing: Total assistance, +2 for physical assistance, +2 for safety/equipment, Bed level Lower Body Dressing Details (indicate cue type and reason): Unable to assist with donning prosthetics due to LUE flaccidity Toilet Transfer: Total assistance, +2 for physical assistance, +2 for safety/equipment, Transfer board Toilet Transfer Details (indicate cue type and reason): Attempted with lateral scoot, pt unable to complete Toileting- Clothing Manipulation and Hygiene: Total assistance, +2 for physical assistance, +2 for safety/equipment, Bed level Toileting - Clothing Manipulation Details (indicate cue type and reason): Needed assist with rolling,  unable to help with hygiene Functional mobility during ADLs: Total assistance, +2 for physical assistance, +2 for safety/equipment (bed level) General ADL Comments: Pt unable to don prosthetics without total A     Mobility   Overal bed mobility: Needs Assistance Bed Mobility: Supine to Sit, Sit to Supine, Rolling Rolling: Mod assist Supine to sit: Mod assist Sit to supine: Mod assist General bed mobility comments: Mod Multi-modal repeated cues for all; Rolling: cues to reach and assist with bed pad; Supine sit: cues to get hips to EOB and assist to lift trunk; Sit to supine: assist for legs and to control trunk     Transfers   Overall transfer level: Needs assistance Transfers: Bed to chair/wheelchair/BSC Bed to/from chair/wheelchair/BSC transfer type:: Lateral/scoot transfer  Lateral/Scoot Transfers: Max assist General transfer comment: Attempted with STEDY and bed elevated - pt leaning forward but low initiation in R leg to stand.  Multiple attempts but unable with assist of 1.  Lateral Scooting at EOB: Max cues, mod A with bed pads, cues to coordinate movement with therapist efforts     Ambulation / Gait / Stairs / Wheelchair Mobility   Ambulation/Gait General Gait Details: deferred, awaiting bil prosthetic devices     Posture / Balance Dynamic Sitting Balance Sitting balance - Comments: Pt started with R UE support but was able to progress to close stand by without UE support.  Worked on sitting and reaching forward to tray (pt's food arrived and she ate several bites with therapist).  Pt also was able to reach backward and turn to right but required UE support.  During lateral scooting required min guard. Balance Overall balance assessment: Needs assistance Sitting-balance support: Single extremity supported, Feet supported, No upper extremity supported Sitting balance-Leahy Scale: Poor Sitting balance - Comments: Pt started with R UE support but was able to progress to close stand  by without UE support.  Worked on sitting and reaching forward to tray (pt's food arrived and she ate several bites with therapist).  Pt also was able to reach backward and turn to right but required UE support.  During lateral scooting required min guard. Postural control: Left lateral lean Standing balance comment: deferred, awaiting bil prosthetic devices     Special needs/care consideration ESRD on hemodialysis at [facility] She drove self to hemodialysis therefore transportation needs to be addressed prior to discharge home    Previous Home Environment  Living Arrangements: Alone  Lives With: Alone Available Help at Discharge: Family, Available 24 hours/day (50 year old Mom coming to assist , sister and family can assist) Type of Home: House Home Layout: One level Home Access: Ramped entrance Bathroom Shower/Tub: Chiropodist: Standard Bathroom Accessibility: Yes How Accessible: Accessible via walker Gamaliel: No Additional Comments: having bathroom redone   Discharge Living Setting Plans for Discharge Living Setting: Patient's home, Alone (85 year old Mom coming  from [place] and local sister to assist) Type of Home at Discharge: House Discharge Home Layout: One level Discharge Home Access: [place] entrance Discharge Bathroom Shower/Tub: Tub/shower unit Discharge Bathroom Toilet: Standard Discharge Bathroom Accessibility: Yes How Accessible: Accessible via walker Does the patient have any problems obtaining your medications?: No   Social/Family/Support Systems Contact Information: sister, Ivin Booty and niece Mathews Argyle Anticipated Caregiver: Mom , sister and family Anticipated Caregiver's Contact Information: see contacts Ability/Limitations of Caregiver: Mom is 77 year old can provide supervision, SIster workd T, W, [place] as a CNA, neices can asisst also Caregiver Availability: 24/7 Discharge Plan Discussed with Primary Caregiver: Yes Is  Caregiver In Agreement with Plan?: Yes Does Caregiver/Family have Issues with Lodging/Transportation while Pt is in Rehab?: No   Goals Patient/Family Goal for Rehab: supervision slideboard transfers and wheelchair mobility, min assist OT. Wheelchair level goals Expected length of stay: ELOS 14 to 20 days Pt/Family Agrees to Admission and willing to participate: Yes Program Orientation Provided & Reviewed with Pt/Caregiver Including Roles  & Responsibilities: Yes   Decrease burden of Care through IP rehab admission: n/a   Possible need for SNF placement upon discharge: not anticipated   Patient Condition: I have reviewed medical records from [facility], spoken with CM, and patient and family member. I met with patient at the bedside for inpatient rehabilitation assessment.  Patient will benefit from ongoing PT and OT, can actively participate in 3 hours of therapy a day 5 days of the week, and can make measurable gains during the admission.  Patient will also benefit from the coordinated team approach during an Inpatient Acute Rehabilitation admission.  The patient will receive intensive therapy as well as Rehabilitation physician, nursing, social worker, and care management interventions.  Due to bladder management, bowel management, safety, skin/wound care, disease management, medication administration, pain management, and patient education the patient requires 24 hour a day rehabilitation nursing.  The patient is currently mod to max assist overall with mobility and basic ADLs.  Discharge setting and therapy post discharge at home with home health is anticipated.  Patient has agreed to participate in the Acute Inpatient Rehabilitation Program and will admit Saturday 03/25/2022.   Preadmission Screen Completed By:  Cleatrice Burke, 03/24/2022 4:06 PM ______________________________________________________________________   Discussed status with Dr. Letta Pate on 03/24/22 at 1535 and  received approval for admission Saturday 03/25/22.   Admission Coordinator:  Cleatrice Burke, RN, time 1619/Date 03/24/22    Assessment/Plan: Diagnosis:Right MCA embolic infarct Does the need for close, 24 hr/day Medical supervision in concert with the patient's rehab needs make it unreasonable for this patient to be served in a less intensive setting? Yes Co-Morbidities requiring supervision/potential complications: BIlateral BKA, HTN, ESRD Due to bladder management, bowel management, safety, skin/wound care, disease management, medication administration, pain management, and patient education, does the patient require 24 hr/day rehab nursing? Yes Does the patient require coordinated care of a physician, rehab nurse, PT, OT, and SLP to address physical and functional deficits in the context of the above medical diagnosis(es)? Yes Addressing deficits in the following areas: balance, endurance, locomotion, strength, transferring, bowel/bladder control, bathing, dressing, feeding, grooming, toileting, cognition, swallowing, and psychosocial support Can the patient actively participate in an intensive therapy program of at least 3 hrs of therapy 5 days a week? Yes The potential for patient to make measurable gains while on inpatient rehab is good Anticipated functional outcomes upon discharge from inpatient rehab: supervision PT, supervision OT, supervision SLP Estimated rehab length of  stay to reach the above functional goals is: 14-20d Anticipated discharge destination: Home 10. Overall Rehab/Functional Prognosis: good     MD Signature: Charlett Blake M.D. [place] [facility] Fellow Am Acad of Phys Med and [place] Board of Electrodiagnostic Med Fellow Am Board of Interventional Pain           Revision History                           Note Details  Author Charlett Blake, MD File Time 03/25/2022 12:18 PM  Author Type Physician Status Signed  Last  Editor Charlett Blake, MD Service Physical Medicine and [place] # [id number] Admit Date 03/25/2022

## 2022-03-25 NOTE — Progress Notes (Signed)
Inpatient Rehabilitation Admission Medication Review by a Pharmacist  A complete drug regimen review was completed for this patient to identify any potential clinically significant medication issues.  High Risk Drug Classes Is patient taking? Indication by Medication  Antipsychotic Yes Compazine- N/V  Anticoagulant Yes Apixaban- AF  Antibiotic No   Opioid No   Antiplatelet No   Hypoglycemics/insulin No   Vasoactive Medication Yes Toprol- rate control  Chemotherapy No   Other Yes Elavil- sleep Pravachol- HLD     Type of Medication Issue Identified Description of Issue Recommendation(s)  Drug Interaction(s) (clinically significant)     Duplicate Therapy     Allergy     No Medication Administration End Date     Incorrect Dose     Additional Drug Therapy Needed     Significant med changes from prior encounter (inform family/care partners about these prior to discharge).    Other  PTA meds: Norvasc 10 mg po daily Allegra 180 mg po qday Insulin glargine 10-12 units [place] daily Singulair 10 mg qday Velphoro 500 mg tid with meals Demadex 100 mg po bid Cinacalcet Restart PTA meds when and if necessary during CIR admission or at time of discharge, if warranted  Unable to verify if she has active orders for Allegra and [employer]. The med rec process has been delayed several times 2/2 timing of patient with providers for acute issues    Clinically significant medication issues were identified that warrant physician communication and completion of prescribed/recommended actions by midnight of the next day:  No   Time spent performing this drug regimen review (minutes):  30   Thank you Anette Guarneri, PharmD 03/25/2022 2:39 PM  Contact: [phone] after 3 PM

## 2022-03-25 NOTE — Discharge Summary (Addendum)
Stroke Discharge Summary  Patient ID: Cindy Osborne   MRN: [id number]      DOB: 1966-08-01  Date of Admission: 03/21/2022 Date of Discharge: 03/25/2022  Attending Physician:  Stroke, Md, MD, Stroke MD Consultant(s):  Treatment Team:  Roney Jaffe, MD nephrology, Kirk Ruths, Cardiology Patient's PCP:  Glean Hess, MD  Discharge Diagnoses:  Moderate-sized patchy acute ischemic right MCA infarcts due to acute right M2/MCA posterior division occlusions/p mechanical thrombectomy of partial 2 branches of 3 vessel @ MCA Bifurcation  likely due to new diagnosed atrial fibrillation.   Principal Problem:   Stroke [facility]) Active Problems:   Stroke (cerebrum) (HCC) Hyperlipidemia Left hemiplegia End-stage renal disease on hemodialysis Diabetes Bilateral BKA New onset atrial fibrillation with rapid ventricular rate   Medications to be continued on Rehab Allergies as of 03/25/2022       Reactions   Augmentin [amoxicillin-pot Clavulanate] Shortness Of Breath   Atorvastatin Other (See Comments)   Back pain   Pioglitazone Other (See Comments)   Edema   Peanut-containing Drug Products Itching   Rosuvastatin Itching, Other (See Comments)   Back pain   Shellfish-derived Products Itching, Rash   shrimp   Sulfa Antibiotics Rash, Other (See Comments)   Shedding of skin        Medication List     STOP taking these medications    amLODipine 10 MG tablet Commonly known as: NORVASC   aspirin 81 MG chewable tablet   BASAGLAR KWIKPEN [place]   montelukast 10 MG tablet Commonly known as: SINGULAIR   torsemide 100 MG tablet Commonly known as: DEMADEX   Velphoro 500 MG chewable tablet Generic drug: sucroferric oxyhydroxide       TAKE these medications    acetaminophen 325 MG tablet Commonly known as: TYLENOL Take 2 tablets (650 mg total) by mouth every 4 (four) hours as needed for mild pain (or temp > 37.5 C (99.5 F)).   acetaminophen 160 MG/5ML  solution Commonly known as: TYLENOL Place 20.3 mLs (650 mg total) into feeding tube every 4 (four) hours as needed for mild pain (or temp > 37.5 C (99.5 F)).   acetaminophen 650 MG suppository Commonly known as: TYLENOL Place 1 suppository (650 mg total) rectally every 4 (four) hours as needed for mild pain (or temp > 37.5 C (99.5 F)).   amitriptyline 25 MG tablet Commonly known as: ELAVIL Take 25 mg by mouth at bedtime.   apixaban 5 MG Tabs tablet Commonly known as: ELIQUIS Take 1 tablet (5 mg total) by mouth 2 (two) times daily.   cinacalcet 30 MG tablet Commonly known as: SENSIPAR Take 3 tablets (90 mg total) by mouth Every Tuesday,Thursday,and Saturday with dialysis.   fexofenadine 180 MG tablet Commonly known as: ALLEGRA Take 180 mg by mouth daily.   insulin aspart 100 UNIT/ML injection Commonly known as: novoLOG Inject 0-20 Units into the skin every 4 (four) hours.   metoprolol 200 MG 24 hr tablet Commonly known as: TOPROL-XL Take 1 tablet (200 mg total) by mouth daily. Take with or immediately following a meal.   ondansetron 4 MG/2ML Soln injection Commonly known as: ZOFRAN Inject 2 mLs (4 mg total) into the vein every 6 (six) hours as needed for nausea or vomiting.   pravastatin 40 MG tablet Commonly known as: PRAVACHOL Take 1 tablet (40 mg total) by mouth daily at 6 PM.   senna-docusate 8.6-50 MG tablet Commonly known as: Senokot-S Take 1 tablet by mouth at bedtime as  needed for mild constipation.        LABORATORY STUDIES CBC    Component Value Date/Time   WBC 8.7 03/25/2022 0222   RBC 4.19 03/25/2022 0222   HGB 11.7 (L) 03/25/2022 0222   HGB 10.3 (L) 02/26/2020 1028   HCT 36.3 03/25/2022 0222   HCT 30.7 (L) 02/26/2020 1028   PLT 234 03/25/2022 0222   PLT 222 02/26/2020 1028   MCV 86.6 03/25/2022 0222   MCV 91 02/26/2020 1028   MCH 27.9 03/25/2022 0222   MCHC 32.2 03/25/2022 0222   RDW 17.5 (H) 03/25/2022 0222   RDW 11.8 02/26/2020 1028    LYMPHSABS 1.3 03/21/2022 1320   LYMPHSABS 3.2 (H) 02/26/2020 1028   MONOABS 0.7 03/21/2022 1320   EOSABS 0.0 03/21/2022 1320   EOSABS 0.3 02/26/2020 1028   BASOSABS 0.0 03/21/2022 1320   BASOSABS 0.0 02/26/2020 1028   CMP    Component Value Date/Time   NA 136 03/24/2022 1019   NA 136 (A) 02/02/2021 0000   K 4.0 03/24/2022 1019   CL 93 (L) 03/24/2022 1019   CO2 27 03/24/2022 1019   GLUCOSE 144 (H) 03/24/2022 1019   BUN 30 (H) 03/24/2022 1019   BUN 112 (A) 02/02/2021 0000   CREATININE 6.77 (H) 03/24/2022 1019   CALCIUM 9.9 03/24/2022 1019   PROT 8.4 (H) 03/21/2022 1320   PROT 7.1 06/30/2020 1510   ALBUMIN 3.2 (L) 03/24/2022 1019   ALBUMIN 4.2 06/30/2020 1510   AST 44 (H) 03/21/2022 1320   ALT 18 03/21/2022 1320   ALKPHOS 151 (H) 03/21/2022 1320   BILITOT 0.9 03/21/2022 1320   BILITOT 0.2 06/30/2020 1510   GFRNONAA 7 (L) 03/24/2022 1019   GFRAA 4 02/02/2021 0000   COAGS Lab Results  Component Value Date   INR 1.3 (H) 03/21/2022   INR 1.2 01/21/2022   Lipid Panel    Component Value Date/Time   CHOL 107 03/22/2022 0243   CHOL 168 03/03/2021 0858   TRIG 84 03/22/2022 0243   HDL 35 (L) 03/22/2022 0243   HDL 35 (L) 03/03/2021 0858   CHOLHDL 3.1 03/22/2022 0243   VLDL 17 03/22/2022 0243   LDLCALC 55 03/22/2022 0243   LDLCALC 113 (H) 03/03/2021 0858   HgbA1C  Lab Results  Component Value Date   HGBA1C 6.0 (H) 01/22/2022   Urinalysis    Component Value Date/Time   BILIRUBINUR neg 06/09/2021 1128   PROTEINUR Positive (A) 06/09/2021 1128   UROBILINOGEN 0.2 06/09/2021 1128   NITRITE neg' 06/09/2021 1128   LEUKOCYTESUR Large (3+) (A) 06/09/2021 1128   Urine Drug Screen No results found for: "LABOPIA", "COCAINSCRNUR", "LABBENZ", "AMPHETMU", "THCU", "LABBARB"  Alcohol Level    Component Value Date/Time   ETH <10 03/21/2022 1320     SIGNIFICANT DIAGNOSTIC STUDIES VAS [place] LOWER EXTREMITY VENOUS (DVT)  Result Date: 03/22/2022  Lower Venous DVT Study Patient  Name:  KRISTIANE MORSCH  Date of Exam:   03/22/2022 Medical Rec #: [id number]          Accession #:    [id number] Date of Birth: 1965/12/22           Patient Gender: F Patient Age:   56 years Exam Location:  [facility] Procedure:      VAS [place] LOWER EXTREMITY VENOUS (DVT) Referring Phys: Cornelius Moras XU --------------------------------------------------------------------------------  Indications: Stroke.  Comparison Study: No previous exam noted. Performing Technologist: Bobetta Lime BS, RVT  Examination Guidelines: A complete evaluation includes B-mode imaging, spectral Doppler,  color Doppler, and power Doppler as needed of all accessible portions of each vessel. Bilateral testing is considered an integral part of a complete examination. Limited examinations for reoccurring indications may be performed as noted. The reflux portion of the exam is performed with the patient in reverse Trendelenburg.  +---------+---------------+---------+-----------+----------+--------------+ RIGHT    CompressibilityPhasicitySpontaneityPropertiesThrombus Aging +---------+---------------+---------+-----------+----------+--------------+ CFV      Full           Yes      Yes                                 +---------+---------------+---------+-----------+----------+--------------+ SFJ      Full                                                        +---------+---------------+---------+-----------+----------+--------------+ FV Prox  Full                                                        +---------+---------------+---------+-----------+----------+--------------+ FV Mid   Full                                                        +---------+---------------+---------+-----------+----------+--------------+ FV DistalFull                                                        +---------+---------------+---------+-----------+----------+--------------+ PFV      Full                                                         +---------+---------------+---------+-----------+----------+--------------+ POP      Full           Yes      Yes                                 +---------+---------------+---------+-----------+----------+--------------+   Right Technical Findings: Not visualized segments include posterior tibial and peroneal veins due to BKA.  +---------+---------------+---------+-----------+----------+--------------+ LEFT     CompressibilityPhasicitySpontaneityPropertiesThrombus Aging +---------+---------------+---------+-----------+----------+--------------+ CFV      Full           Yes      Yes                                 +---------+---------------+---------+-----------+----------+--------------+ SFJ      Full                                                        +---------+---------------+---------+-----------+----------+--------------+  FV Prox  Full                                                        +---------+---------------+---------+-----------+----------+--------------+ FV Mid   Full                                                        +---------+---------------+---------+-----------+----------+--------------+ FV DistalFull                                                        +---------+---------------+---------+-----------+----------+--------------+ PFV      Full                                                        +---------+---------------+---------+-----------+----------+--------------+ POP      Full           Yes      Yes                                 +---------+---------------+---------+-----------+----------+--------------+   Left Technical Findings: Not visualized segments include posterior tibial and posterior tibial veins due to BKA.   Summary: BILATERAL: - No evidence of deep vein thrombosis seen in the lower extremities, bilaterally. -No evidence of popliteal cyst, bilaterally. RIGHT: - Right BKA  LEFT: -  Left BKA  *See table(s) above for measurements and observations. Electronically signed by Harold Barban MD on 03/22/2022 at 11:21:57 PM.    Final    MR BRAIN WO CONTRAST  Result Date: 03/22/2022 CLINICAL DATA:  Follow-up examination for stroke. EXAM: MRI HEAD WITHOUT CONTRAST TECHNIQUE: Multiplanar, multiecho pulse sequences of the brain and surrounding structures were obtained without intravenous contrast. COMPARISON:  Prior CT from 03/21/2022 as well as brain MRI from 01/22/2022. FINDINGS: Brain: Cerebral volume stable, and remains within normal limits. There has been normal expected interval evolution of previously identified right cerebral infarcts since 01/22/2022. Additional chronic infarcts involving the left parieto-occipital region and left cerebellum noted as well. Mild scattered chronic hemosiderin staining noted about a few of these chronic infarcts. Patchy multifocal restricted diffusion involving the right frontal and parietal lobes, consistent with acute right MCA distribution infarct. Involvement is most pronounced at the deep white matter of the right frontal lobe. Scattered areas of associated petechial hemorrhage, most pronounced at the right precentral gyrus (series 12, image 43) Heidelberg classification 1b: HI2, confluent petechiae, no mass effect. No frank hemorrhagic transformation or significant mass effect. No other acute or subacute ischemia elsewhere within the brain. No other acute intracranial hemorrhage. No mass lesion, midline shift, or mass effect. No hydrocephalus or extra-axial fluid collection. Pituitary gland and suprasellar region within normal limits. Vascular: Major intracranial vascular flow voids are maintained. Skull and upper cervical spine: Craniocervical junction within normal limits. Bone marrow  signal intensity diffusely decreased on T1 weighted sequence, nonspecific, but most commonly related to anemia, smoking or obesity. No focal marrow replacing lesion. No scalp  soft tissue abnormality. Sinuses/Orbits: Globes and orbital soft tissues within normal limits. Paranasal sinuses are largely clear. Trace right mastoid effusion, of doubtful significance. Other: None. IMPRESSION: 1. Moderate-sized patchy acute ischemic right MCA distribution infarct as above. Associated petechial hemorrhage without frank hemorrhagic transformation or significant mass effect. 2. Few scattered underlying chronic ischemic infarcts as above. No other acute intracranial abnormality. Electronically Signed   By: Jeannine Boga M.D.   On: 03/22/2022 05:59   IR US Guide Vasc Access Right  Result Date: 03/21/2022 INDICATION: Right MCA syndrome; right common femoral arterial access under ultrasound guidance EXAM: ULTRASOUND-GUIDED VASCULAR ACCESS DIAGNOSTIC CEREBRAL ANGIOGRAM MECHANICAL THROMBECTOMY FLAT PANEL HEAD CT COMPARISON:  None Available. MEDICATIONS: No antibiotic was administered. ANESTHESIA/SEDATION: General anesthesia TECHNIQUE: Real-time ultrasound guidance was utilized for vascular access including the acquisition of permanent ultrasound image documenting patency of the accessed right common femoral artery. COMPLICATIONS: None immediate. FINDINGS: Common femoral artery was patent PROCEDURE: Mechanical thrombectomy was performed by stentriever technique for high-grade partial occlusion of the proximal M2/right MCA branches with TICI 3 results IMPRESSION: Ultrasound-guided access into the right common femoral artery. Mechanical thrombectomy by stentriever technique for high-grade partial occlusion of the proximal M2 superior and inferior divisions of the right MCA with TICI 3 results PLAN: Admission to ICU Blood pressure goal of 120-140 mm Hg Please refer to XA IR percutaneous artery thrombectomy/infusion for detailed report. Electronically Signed   By: Frazier Richards M.D.   On: 03/21/2022 17:17   DG CHEST PORT 1 VIEW  Result Date: 03/21/2022 CLINICAL DATA:  [id number]; encounter for BiPAP  use counseling EXAM: PORTABLE CHEST 1 VIEW COMPARISON:  None Available. FINDINGS: Moderate cardiomegaly. Moderately large right pleural effusion. Pulmonary edema. There are likely superimposed atelectasis-infiltrations at the right lower lobe. Moderate thoracic spondylosis. IMPRESSION: Moderate cardiomegaly and pulmonary edema. Right pleural effusion with likely superimposed right basal atelectasis-infiltrations. Electronically Signed   By: Frazier Richards M.D.   On: 03/21/2022 16:18   IR PERCUTANEOUS ART THROMBECTOMY/INFUSION INTRACRANIAL INC DIAG ANGIO  Result Date: 03/21/2022 INDICATION: 56 year old female with multiple medical issues with history of hypertension, hyperlipidemia, diabetes mellitus, previous stroke, status post right M1 thrombectomy on 01/21/2022, presented with right MCA syndrome. EXAM: ULTRASOUND-GUIDED VASCULAR ACCESS DIAGNOSTIC CEREBRAL ANGIOGRAM MECHANICAL THROMBECTOMY FLAT PANEL HEAD CT COMPARISON:  None Available. MEDICATIONS: Ancef 2 g IV. The antibiotic was administered within 1 hour of the procedure Performing surgeon: Dr. Frazier Richards Assistant: Dr. Karenann Cai ANESTHESIA/SEDATION: The procedure was performed under general anesthesia. CONTRAST:  50 mL of Omnipaque 300 milligram/mL FLUOROSCOPY: Fluoro time: 16.6 minutes Radiation Exposure Index (as provided by the fluoroscopic device): 350.0 mGy Kerma COMPLICATIONS: SIR Level A - No therapy, no consequence. TECHNIQUE: Informed phone consent was obtained from the patient's niece after a thorough discussion of the procedural risks, benefits and alternatives. All questions were addressed. Maximal Sterile Barrier Technique was utilized including caps, mask, sterile gowns, sterile gloves, sterile drape, hand hygiene and skin antiseptic. A timeout was performed prior to the initiation of the procedure. The right groin was prepped and draped in the usual sterile fashion. Using a micropuncture kit and the modified Seldinger technique,  access was gained to the right common femoral artery and an 8 [place] by 25 cm sheath was placed. Real-time ultrasound guidance was utilized for vascular access including the acquisition of a permanent ultrasound image documenting  patency of the accessed vessel. Under fluoroscopy, a Zoom 88 guide catheter was navigated over a 5 [place] neuron select Berenstein catheter and a 0.035" Terumo Glidewire into the aortic arch. The catheter was placed into the right common carotid artery and then advanced into the right internal carotid artery under roadmap guidance. The diagnostic catheter was removed. Frontal and lateral angiograms of the head were obtained. FINDINGS: 1. Normal caliber of the right common femoral artery, adequate for vascular access. 2. High-grade partial occlusion of the proximal M2/MCA (superior and inferior divisions). 3. Delayed flow into the distal branches of the MCA. PROCEDURE: Using biplane roadmap, a zoom 055 aspiration catheter was navigated over an Aristotle 24 microguidewire into the cavernous segment of the right ICA and then the Aristotle 024 subtle wire was navigated into the superior division of the right MCA. However, the aspiration catheter could not be advanced to the site of the high-grade partial occlusion. Next, phenom 21 and synchro 14 microwire were selected and micro catheterization of the M3 segment of the superior division of the right MCA was performed. Subsequently, 3 mm x 20 mm solitaire X stent was selected and was deployed spanning the M2/M1 segment. The device was allowed to intercalated with the clot for 3 minutes. The microcatheter was removed. The aspiration catheter was advanced to the level of occlusion and connected to an aspiration pump. The thrombectomy device and aspiration catheter were removed under constant aspiration. Post thrombectomy right internal carotid artery angiograms with frontal and lateral views showed recanalization of the proximal right M2/MCA, TICI  3. Next, flat panel CT of the head was obtained and post processed in a separate workstation with concurrent attending physician supervision. Selected images were sent to PACS. Flat panel CT was negative for subarachnoid bleed. After about 10 minutes, biplane cerebral angiogram was repeated demonstrating continued recanalization of the right M2/MCA divisions. Right common femoral artery angiogram was obtained in right anterior oblique view. The puncture is at the level of the common femoral artery. The artery has normal caliber, adequate for closure device. The sheath was exchanged over the wire for an 8 [place] Angio-Seal which was utilized for access closure. Immediate hemostasis was achieved. Patient was then extubated and transferred to the ICU in stable condition. IMPRESSION: 1. Successful mechanical thrombectomy for high-grade partial occlusion of the proximal M2 divisions (superior and inferior) of the right MCA using the stentriever technique x1. 2. Flat panel head CT was negative for subarachnoid bleed. PLAN: 1. Transfer to ICU. 2. Blood pressure goal of 120-140 mm Hg. Electronically Signed   By: Frazier Richards M.D.   On: 03/21/2022 15:42   IR CT Head Ltd  Result Date: 03/21/2022 INDICATION: 56 year old female with multiple medical issues with history of hypertension, hyperlipidemia, diabetes mellitus, previous stroke, status post right M1 thrombectomy on 01/21/2022, presented with right MCA syndrome. EXAM: ULTRASOUND-GUIDED VASCULAR ACCESS DIAGNOSTIC CEREBRAL ANGIOGRAM MECHANICAL THROMBECTOMY FLAT PANEL HEAD CT COMPARISON:  None Available. MEDICATIONS: Ancef 2 g IV. The antibiotic was administered within 1 hour of the procedure Performing surgeon: Dr. Frazier Richards Assistant: Dr. Karenann Cai ANESTHESIA/SEDATION: The procedure was performed under general anesthesia. CONTRAST:  50 mL of Omnipaque 300 milligram/mL FLUOROSCOPY: Fluoro time: 16.6 minutes Radiation Exposure Index (as provided by the  fluoroscopic device): 301.6 mGy Kerma COMPLICATIONS: SIR Level A - No therapy, no consequence. TECHNIQUE: Informed phone consent was obtained from the patient's niece after a thorough discussion of the procedural risks, benefits and alternatives. All questions were addressed. Maximal Sterile  Barrier Technique was utilized including caps, mask, sterile gowns, sterile gloves, sterile drape, hand hygiene and skin antiseptic. A timeout was performed prior to the initiation of the procedure. The right groin was prepped and draped in the usual sterile fashion. Using a micropuncture kit and the modified Seldinger technique, access was gained to the right common femoral artery and an 8 [place] by 25 cm sheath was placed. Real-time ultrasound guidance was utilized for vascular access including the acquisition of a permanent ultrasound image documenting patency of the accessed vessel. Under fluoroscopy, a Zoom 88 guide catheter was navigated over a 5 [place] neuron select Berenstein catheter and a 0.035" Terumo Glidewire into the aortic arch. The catheter was placed into the right common carotid artery and then advanced into the right internal carotid artery under roadmap guidance. The diagnostic catheter was removed. Frontal and lateral angiograms of the head were obtained. FINDINGS: 1. Normal caliber of the right common femoral artery, adequate for vascular access. 2. High-grade partial occlusion of the proximal M2/MCA (superior and inferior divisions). 3. Delayed flow into the distal branches of the MCA. PROCEDURE: Using biplane roadmap, a zoom 055 aspiration catheter was navigated over an Aristotle 24 microguidewire into the cavernous segment of the right ICA and then the Aristotle 024 subtle wire was navigated into the superior division of the right MCA. However, the aspiration catheter could not be advanced to the site of the high-grade partial occlusion. Next, phenom 21 and synchro 14 microwire were selected and micro  catheterization of the M3 segment of the superior division of the right MCA was performed. Subsequently, 3 mm x 20 mm solitaire X stent was selected and was deployed spanning the M2/M1 segment. The device was allowed to intercalated with the clot for 3 minutes. The microcatheter was removed. The aspiration catheter was advanced to the level of occlusion and connected to an aspiration pump. The thrombectomy device and aspiration catheter were removed under constant aspiration. Post thrombectomy right internal carotid artery angiograms with frontal and lateral views showed recanalization of the proximal right M2/MCA, TICI 3. Next, flat panel CT of the head was obtained and post processed in a separate workstation with concurrent attending physician supervision. Selected images were sent to PACS. Flat panel CT was negative for subarachnoid bleed. After about 10 minutes, biplane cerebral angiogram was repeated demonstrating continued recanalization of the right M2/MCA divisions. Right common femoral artery angiogram was obtained in right anterior oblique view. The puncture is at the level of the common femoral artery. The artery has normal caliber, adequate for closure device. The sheath was exchanged over the wire for an 8 [place] Angio-Seal which was utilized for access closure. Immediate hemostasis was achieved. Patient was then extubated and transferred to the ICU in stable condition. IMPRESSION: 1. Successful mechanical thrombectomy for high-grade partial occlusion of the proximal M2 divisions (superior and inferior) of the right MCA using the stentriever technique x1. 2. Flat panel head CT was negative for subarachnoid bleed. PLAN: 1. Transfer to ICU. 2. Blood pressure goal of 120-140 mm Hg. Electronically Signed   By: Frazier Richards M.D.   On: 03/21/2022 15:42   CT ANGIO HEAD NECK W WO CM W PERF (CODE STROKE)  Result Date: 03/21/2022 CLINICAL DATA:  Neuro deficit, acute, stroke suspected. EXAM: CT ANGIOGRAPHY  HEAD AND NECK CT PERFUSION BRAIN TECHNIQUE: Multidetector CT imaging of the head and neck was performed using the standard protocol during bolus administration of intravenous contrast. Multiplanar CT image reconstructions and MIPs were obtained  to evaluate the vascular anatomy. Carotid stenosis measurements (when applicable) are obtained utilizing NASCET criteria, using the distal internal carotid diameter as the denominator. Multiphase CT imaging of the brain was performed following IV bolus contrast injection. Subsequent parametric perfusion maps were calculated using RAPID software. RADIATION DOSE REDUCTION: This exam was performed according to the departmental dose-optimization program which includes automated exposure control, adjustment of the mA and/or kV according to patient size and/or use of iterative reconstruction technique. CONTRAST:  143m OMNIPAQUE IOHEXOL 350 MG/ML SOLN COMPARISON:  CT angiogram January 21, 2022. FINDINGS: CTA NECK FINDINGS Aortic arch: Standard branching. Imaged portion shows no evidence of aneurysm or dissection. Atherosclerotic calcified plaques are seen in the aortic arch. No significant stenosis of the major arch vessel origins. Right carotid system: Atherosclerotic changes of the right carotid bifurcation without hemodynamically significant stenosis. Left carotid system: Mild atherosclerotic changes of the left carotid bifurcation without hemodynamically significant stenosis. Vertebral arteries: Atherosclerotic changes at the origin of the right vertebral artery resulting in severe stenosis. Skeleton: No acute or aggressive process identified. Other neck: 2.4 cm left thyroid lobe lobe nodule. Recommend nonemergent thyroid [place] Reference: J Am Coll Radiol. 2015 Feb;12(2): 143-50. Upper chest: Bilateral pleural effusion. Scattered opacities in the bilateral lung apices, some of them appear nodular. Review of the MIP images confirms the above findings CTA HEAD FINDINGS Anterior  circulation: Short segment of occlusion of the proximal right M2/MCA posterior division branch with distal reconstitution by collaterals. Calcified plaques in the bilateral carotid siphons. Bilateral ACA is and left MCA have normal caliber. Posterior circulation: No significant stenosis, proximal occlusion, aneurysm, or vascular malformation. Venous sinuses: As permitted by contrast timing, patent. Anatomic variants: None significant. Review of the MIP images confirms the above findings CT Brain Perfusion Findings: ASPECTS: 9 CBF (<30%) Volume: 045mPerfusion (Tmax>6.0s) volume: 2145mismatch Volume: 13m37mfarction Location:Right parietal lobe. IMPRESSION: 1. Acute right M2/MCA posterior division occlusion. 2. A 21 mL of ischemic penumbra right identified. 3. Scattered opacities in the bilateral lung apices, likely infectious in nature. However, given nodular appearance, follow-up chest CT is recommended. 4. A 2.4 cm thyroid lobe nodule. These results were called by telephone at the time of interpretation on 03/21/2022 at 1:36 pm to provider ER[facility] LINDWhite [facility]ence [facility] Suitesho verbally acknowledged these results. Electronically Signed   By: KatyPedro Earls.   On: 03/21/2022 13:48   CT HEAD CODE STROKE WO CONTRAST  Result Date: 03/21/2022 CLINICAL DATA:  Code stroke. Neuro deficit, acute, stroke suspected. EXAM: CT HEAD WITHOUT CONTRAST TECHNIQUE: Contiguous axial images were obtained from the base of the skull through the vertex without intravenous contrast. RADIATION DOSE REDUCTION: This exam was performed according to the departmental dose-optimization program which includes automated exposure control, adjustment of the mA and/or kV according to patient size and/or use of iterative reconstruction technique. COMPARISON:  MRI of the brain January 22, 2022. FINDINGS: Brain: Small hypodense area involving the cortex in the right frontal lobe may represent acute infarct. Hypodensity within the deep white matter of the  right frontal lobe without corresponding cortical involvement with no corresponding white matter disease seen on prior MRI. No evidence of acute hemorrhage, hydrocephalus, extra-axial collection or mass lesion/mass effect. Remote infarcts in the right insula, temporal and parietal lobes corresponding to infarcts demonstrated on prior MRI. Vascular: No hyperdense vessel or unexpected calcification. Skull: Normal. Negative for fracture or focal lesion. Sinuses/Orbits: No acute finding. Other: None. ASPECTS ([facility] Early CT Score) - Ganglionic level infarction (  caudate, lentiform nuclei, internal capsule, insula, M1-M3 cortex): 7 - Supraganglionic infarction (M4-M6 cortex): 1 Total score (0-10 with 10 being normal): 9 IMPRESSION: 1. Hypodensity involving the right frontal cortex concerning for acute infarct. 2. Large area of hypodensity within the deep white matter of the right frontal lobe without corresponding cortical hypodensity. No white matter disease seen on prior MRI, question evolving infarct. 3. ASPECTS is 9. These results were called by telephone at the time of interpretation on 03/21/2022 at 1:19 pm to provider [facility] , who verbally acknowledged these results. Electronically Signed   By: Pedro Earls M.D.   On: 03/21/2022 13:20       HISTORY OF PRESENT ILLNESS Cindy Osborne is a 56 y.o. female with history of a stroke in 01/2022 due to left M2 occlusion treated with thrombectomy, DM2, ESRD, HTN, RLS, Obesity, PVD,  Now presented with acute onset of left sided hemiplegia ,facial droop, left hemi neglect and left sensory loss. HOSPITAL COURSE Work up included CTA Head/Neck with acute right M2/MCA posterior division occlusion detected. She was not a TNK candidate due to time She was a VIR candidate. Found to have partial occlusion of 2 branches of 3 vessels at MCA trifurcation and under went mechanical thrombectomy.Now s/p mechanical thrombectomy of  partial 2 branches of 3 vessel @ MCA Bifurcation with neurological improvement. Most recent A1C In June 2023 was 6.0 and 2D echo showed EF 55%. LDL currently 35. Then with Atrial fibrillation with RVR on 8/10 for which heparin drip was started. She was transitioned to Eliquis the next day. No further RVR episodes.   Patient seen in HD this am. She was stable and ready for dc to rehab stating she was going to work hard there.  Stroke: Moderate-sized patchy acute ischemic right MCA infarcts likely due to new diagnosed atrial fibrillation.  CT showed right frontal hypodensity.   CTA head and neck right M2 occlusion.   CTP 0/21cc.   Status post IR with TICI3 reperfusion.   MRI showed moderate scattered right MCA infarcts.   EF 55 to 60% in 01/2022 LE venous Doppler negative for DVT.   A1c 6.0 LDL 55.   DVT prophylaxis - Eliquis Aspirin PTA, now on heparin IV, switched to Eliquis for stroke prevention. Therapists are recommending IPR.  She has been accepted and plan is to admit tomorrow if neuro stable   A-fib RVR New diagnosis HR up to 160s on 8/10 Cardiology on board Status post Cardizem IV, converted to NSR -> DC Resume home Toprol-XL 200 mg    HTN Continue metoprolol Home norvasc on hold, restart as appropriate  History of stroke 01/23/2022 for left-sided weakness numbness.  CT showed right MCA hyperdense sign.  Status post TNK.  CT head and neck showed right M1 occlusion.  Status post IR with TICI3 reperfusion.  MRI showed small right MCA infarcts.  MRI showed right MCA patent.  EF 55 to 60%.  LDL 79, A1c 6.0.  Patient discharged on DAPT and Lipitor 40.   Patient was suppose to have outpatient clinic follow up for an TEE and Loop Recorder. Patient was seen for her stroke follow up on 03/20/22 with Dr Leta Baptist.  Patient reported no residual left from the stroke.  At baseline patient can walk with cane using prosthetics.   Hyperlipidemia LDL 35, well controlled and at goal less than  70 Continue home pravastatin 40 mg po q day due to intolerance with Crestor and Lipitor.  Continue statin  on discharge   ESRD on HD: - Appreciate Nephrology consult and following  - Monitor renal function/labs QD - Renal diet  - Renal dose meds  -Home demadex continues to be hold per discussion with Theda Sers, PA today.  - HD TTS   DM2 A1c 6.0, controlled On sliding scale insulin, Her home Glargine insulin has been on hold, consider restarting as appropriate  CBG monitoring Close PCP follow-up   Scattered opacities in the bilateral lung apices on CTA, likely infectious in nature.  - Given nodular appearance, follow-up chest CT is recommended by Radiology. - Small thyroid nodule also noted on CTA  -Will Need follow up with PCP to address these issues  DISCHARGE EXAM Blood pressure 134/60, pulse 94, temperature 97.8 F (36.6 C), resp. rate (!) 22, height '5\' 9"'  (1.753 m), weight 106.4 kg, SpO2 95 %. PHYSICAL EXAM Neuro: Mental Status: Patient is sitting up in bed in NAD Patient is able to give a clear and coherent history. Card: RRR with Sinus rhythm on monitor.  Abd: soft, non-distended, NTTP   Cranial Nerves: II: Visual Fields are full. Pupils are equal, round, and reactive to light.   III,IV, VI: EOMI without ptosis or diplopia.Able to pass midpoint  V: Facial sensation is slightly decreased on left side VII: Facial weakness with left facial droop VIII: Hearing is intact to voice X: Palate is midline and palate elevates symmetrically, phonation intact XI: Shoulder shrug is asymmetric with LUE weakness XII: tongue is midline without atrophy or fasciculations.  Motor: Tone is normal. Bulk is normal. Muscle Strength:  Her left upper extremity now 3/5 bicep and 2+/5 tricep, RUE 5/5 RLE BKA- 5/5 Sensory: Sensation is decreased on left side with light touch.   Discharge Diet      Diet   DIET DYS 2 Room service appropriate? Yes; Fluid consistency: Thin    liquids  DISCHARGE PLAN Disposition:  Transfer to [place] for ongoing PT, OT and ST Eliquis started for stroke prevention in setting of new diagnosis of Atrial fibrillation Recommend ongoing stroke risk factor control by Primary Care Physician at time of discharge from inpatient rehabilitation. Follow-up PCP Glean Hess, MD in 2 weeks following discharge from rehab. Follow-up in [facility] in 8 weeks following discharge from rehab, office to schedule an appointment.   35 minutes were spent preparing discharge.  This patient was seen and evaluated with Dr. Leonie Man. He directed the plan of care.  Charlene Brooke, NP-C   I have personally obtained history,examined this patient, reviewed notes, independently viewed imaging studies, participated in medical decision making and plan of care.ROS completed by me personally and pertinent positives fully documented  I have made any additions or clarifications directly to the above note. Agree with note above.  Continue Eliquis for stroke prevention for new onset atrial fibrillation and transferred to inpatient rehab.  Follow-up with outpatient stroke clinic in 2 months.  Antony Contras, MD Medical Director [facility] Pager: [phone] 03/25/2022 1:24 PM

## 2022-03-26 ENCOUNTER — Inpatient Hospital Stay (HOSPITAL_COMMUNITY): Payer: Medicare Other

## 2022-03-26 DIAGNOSIS — I63511 Cerebral infarction due to unspecified occlusion or stenosis of right middle cerebral artery: Secondary | ICD-10-CM

## 2022-03-26 NOTE — Evaluation (Signed)
Physical Therapy Assessment and Plan  Patient Details  Name: Cindy Osborne MRN: [id number] Date of Birth: 1966/01/26  PT Diagnosis: Abnormal posture, Cognitive deficits, Difficulty walking, Hemiplegia non-dominant, Hypotonia, and Pain in joint Rehab Potential: Good ELOS: 21-24 days   Today's Date: 03/26/2022 PT Individual Time: 4010-2725 PT Individual Time Calculation (min): 75 min    Hospital Problem: Principal Problem:   Acute ischemic right MCA stroke [facility])   Past Medical History:  Past Medical History:  Diagnosis Date   Amputated toe ([place]) 01/31/2012   Right second toe distal phalanx Right great toe   Diabetes mellitus without complication ([place])    History of diabetic ulcer of foot 04/01/2014   Hyperlipidemia    Hypertension    Stroke ([place]) 01/21/2022   Past Surgical History:  Past Surgical History:  Procedure Laterality Date   angioplasty politeal Left 07/2020   avg for dialysis     CHOLECYSTECTOMY     COLONOSCOPY  08/15/2011   cleared for 10 yrs   IR CT HEAD LTD  01/21/2022   IR CT HEAD LTD  03/21/2022   IR PERCUTANEOUS ART THROMBECTOMY/INFUSION INTRACRANIAL INC DIAG ANGIO  01/21/2022   IR PERCUTANEOUS ART THROMBECTOMY/INFUSION INTRACRANIAL INC DIAG ANGIO  03/21/2022   IR US GUIDE VASC ACCESS RIGHT  01/21/2022   IR US GUIDE VASC ACCESS RIGHT  03/21/2022   LEG AMPUTATION BELOW KNEE Right 04/2019   LEG AMPUTATION BELOW KNEE Left 09/08/2020   RADIOLOGY WITH ANESTHESIA N/A 01/21/2022   Procedure: IR WITH ANESTHESIA;  Surgeon: Radiologist, Medication, MD;  Location: [place];  Service: Radiology;  Laterality: N/A;   RADIOLOGY WITH ANESTHESIA N/A 03/21/2022   Procedure: IR WITH ANESTHESIA;  Surgeon: Radiologist, Medication, MD;  Location:  [place];  Service: Radiology;  Laterality: N/A;   TOE AMPUTATION Right 10/15/2018    Assessment & Plan Clinical Impression: Patient is a 56 y.o. year old female with recent admission to the hospital on with history of HTN, T2DM, B-BKA, CVA 6/23  who was admitted on 03/21/22 after fall with onset of left-sided weakness, left hemineglect and left facial droop with slurred speech. CTA head/Neck showed acute right M2/MCA posterior division occlusion with 21 mL ischemic penumbra and scattered opacities bilateral lung apices likely infectious in nature but follow-up CT chest recommended due to nodular appearance.  She underwent cerebral angio with mechanical thrombectomy of partial occlusions of 2 out of 3 MCA trifurcation with complete recanalization by Dr. Gerhard Perches.  MRI brain done revealing moderate-sized patchy acute ischemic right MCA infarct affecting the right frontal and parietal lobes with associated petechial hemorrhage and few scattered chronic ischemic infarcts.   2 D echo of 06/23 showed EF 55 to 60% with moderate concentric LVH, moderately dilated left atrium and grade 2 DD.  BLE Dopplers were negative for DVT.  Stroke felt to be cardioembolic with plans for cardiac work-up however she developed A-fib with RVR on 08/10.  She was started on IV Cardizem and Dr. Stanford Breed recommended apixaban twice daily.  She converted to NSR and home dose Toprol resumed.  TSH ordered for work-up.  Hemodialysis has been ongoing and she is tolerating this without difficulty.  Dysphagia 2 diet with thin liquids recommended  Patient transferred to CIR on 03/25/2022 .   Patient currently requires max with mobility secondary to decreased cardiorespiratoy endurance, abnormal tone, decreased coordination, and decreased motor planning, decreased attention to left, decreased initiation, decreased attention, decreased awareness, decreased problem solving, decreased safety awareness, decreased memory, and delayed processing, and decreased sitting  balance, decreased standing balance, decreased postural control, hemiplegia, and decreased balance strategies.  Prior to hospitalization, patient was modified independent  using a SPC and B prosthetics with mobility and lived with  Alone in a House home.  Home access is  Ramped entrance.  Patient will benefit from skilled PT intervention to maximize safe functional mobility, minimize fall risk, and decrease caregiver burden for planned discharge home with 24 hour assist.  Anticipate patient will benefit from follow up [facility] at discharge.  PT - End of Session Activity Tolerance: Tolerates 30+ min activity with multiple rests Endurance Deficit: Yes PT Assessment Rehab Potential (ACUTE/IP ONLY): Good PT Barriers to Discharge: Hemodialysis;Behavior PT Patient demonstrates impairments in the following area(s): Balance;Behavior;Edema;Endurance;Motor;Nutrition;Pain;Perception;Safety;Sensory;Skin Integrity PT Transfers Functional Problem(s): Bed Mobility;Bed to Chair;Car;Furniture PT Locomotion Functional Problem(s): Ambulation;Wheelchair Mobility;Stairs PT Plan PT Intensity: Minimum of 1-2 x/day ,45 to 90 minutes PT Frequency: 5 out of 7 days PT Duration Estimated Length of Stay: 21-24 days PT Treatment/Interventions: Ambulation/gait training;Cognitive remediation/compensation;Discharge planning;DME/adaptive equipment instruction;Functional mobility training;Pain management;Psychosocial support;Splinting/orthotics;Therapeutic Activities;UE/LE Strength taining/ROM;Visual/perceptual remediation/compensation;UE/LE Coordination activities;Wheelchair propulsion/positioning;Therapeutic Exercise;Stair training;Skin care/wound management;Patient/family education;Neuromuscular re-education;Functional electrical stimulation;Disease management/prevention;Community reintegration;Balance/vestibular training PT Transfers Anticipated Outcome(s): min A using LRAD PT Locomotion Anticipated Outcome(s): TBD if patient will be ambulatory at d/c PT Recommendation Recommendations for Other Services: Speech consult Follow Up Recommendations: Home health PT Patient destination: Home Equipment Recommended: To be determined   PT  Evaluation Precautions/Restrictions Precautions Precautions: Fall;Other (comment) Precaution Comments: L hemiplegia, Bil BKA 2 years ago and 1.5 years ago Required Braces or Orthoses: Other Brace Other Brace: bil prosthetic Restrictions Weight Bearing Restrictions: No Pain Interference Pain Interference Pain Effect on Sleep: 2. Occasionally Pain Interference with Therapy Activities: 2. Occasionally Pain Interference with Day-to-Day Activities: 2. Occasionally Home Living/Prior Functioning Home Living Available Help at Discharge: Family;Available 24 hours/day (Mom and sister very supportive) Type of Home: House Home Access: Ramped entrance Home Layout: One level Bathroom Shower/Tub: Chiropodist: Standard Bathroom Accessibility: Yes Additional Comments: having bathroom redone, has a wide bariatric BSC, cane, tub transfer bench  Lives With: Alone Prior Function Level of Independence: Requires assistive device for independence;Independent with gait;Independent with transfers (using SPC and B prosthetics for mobility)  Able to Take Stairs?: Yes Driving: Yes Vision/Perception  Vision - History Ability to See in Adequate Light: 0 Adequate Vision - Assessment Eye Alignment: Within Functional Limits Ocular Range of Motion: Restricted on the left Alignment/Gaze Preference: Gaze right Tracking/Visual Pursuits: Impaired - to be further tested in functional context Perception Inattention/Neglect: Does not attend to left visual field;Does not attend to left side of body Praxis Praxis: Impaired Praxis Impairment Details: Motor planning  Cognition Overall Cognitive Status: Impaired/Different from baseline Arousal/Alertness: Awake/alert Orientation Level: Oriented X4 Memory: Impaired Memory Impairment: Decreased recall of new information;Decreased short term memory Decreased Short Term Memory: Functional basic;Verbal basic Awareness: Impaired Problem Solving:  Impaired Behaviors: Impulsive Safety/Judgment: Impaired Sensation Sensation Light Touch: Appears Intact Proprioception: Impaired by gross assessment;Impaired Detail Proprioception Impaired Details: Impaired LUE;Impaired LLE Coordination Gross Motor Movements are Fluid and Coordinated: No Fine Motor Movements are Fluid and Coordinated: No Coordination and Movement Description: L hemi UE>LE Motor  Motor Motor: Hemiplegia;Abnormal postural alignment and control Motor - Skilled Clinical Observations: L hemi, B amputee   Trunk/Postural Assessment  Cervical Assessment Cervical Assessment: Exceptions to [facility] (forward head) Thoracic Assessment Thoracic Assessment: Exceptions to [facility] (rounded shoulders) Lumbar Assessment Lumbar Assessment: Exceptions to [facility] (lumbar lordosis) Postural Control Postural Control: Deficits on evaluation  Balance Static Sitting Balance  Static Sitting - Balance Support: Right upper extremity supported Static Sitting - Level of Assistance: 5: Stand by assistance Dynamic Sitting Balance Dynamic Sitting - Balance Support: Right upper extremity supported Dynamic Sitting - Level of Assistance: 4: Min assist Extremity Assessment  RLE Assessment RLE Assessment: Exceptions to [facility] Active Range of Motion (AROM) Comments: Knee WFL General Strength Comments: Grossly 4+/5 hip/knee flexion/extension in sitting LLE Assessment LLE Assessment: Exceptions to [facility] Passive Range of Motion (PROM) Comments: knee grossly [facility] General Strength Comments: Grossly 2/5 hip/knee flexion/extension  Care Tool Care Tool Bed Mobility Roll left and right activity   Roll left and right assist level: Maximal Assistance - Patient 25 - 49%    Sit to lying activity   Sit to lying assist level: Maximal Assistance - Patient 25 - 49%    Lying to sitting on side of bed activity   Lying to sitting on side of bed assist level: the ability to move from lying on the back to sitting on the side of  the bed with no back support.: Maximal Assistance - Patient 25 - 49%     Care Tool Transfers Sit to stand transfer Sit to stand activity did not occur: Safety/medical concerns      Chair/bed transfer   Chair/bed transfer assist level: 2 Armed forces training and education officer transfer activity did not occur: Safety/medical Personal assistant transfer activity did not occur: Safety/medical concerns        Care Tool Locomotion Ambulation Ambulation activity did not occur: Safety/medical concerns        Walk 10 feet activity Walk 10 feet activity did not occur: Safety/medical concerns       Walk 50 feet with 2 turns activity Walk 50 feet with 2 turns activity did not occur: Safety/medical concerns      Walk 150 feet activity Walk 150 feet activity did not occur: Safety/medical concerns      Walk 10 feet on uneven surfaces activity Walk 10 feet on uneven surfaces activity did not occur: Safety/medical concerns      Stairs Stair activity did not occur: Safety/medical concerns        Walk up/down 1 step activity Walk up/down 1 step or curb (drop down) activity did not occur: Safety/medical concerns      Walk up/down 4 steps activity Walk up/down 4 steps activity did not occur: Safety/medical concerns      Walk up/down 12 steps activity Walk up/down 12 steps activity did not occur: Safety/medical concerns      Pick up small objects from floor Pick up small object from the floor (from standing position) activity did not occur: Safety/medical concerns      Wheelchair Is the patient using a wheelchair?: Yes Type of Wheelchair: Manual   Wheelchair assist level: Dependent - Patient 0% Max wheelchair distance: >150 feet  Wheel 50 feet with 2 turns activity   Assist Level: Dependent - Patient 0%  Wheel 150 feet activity   Assist Level: Dependent - Patient 0%    Refer to Care Plan for Long Term Goals  SHORT TERM GOAL WEEK 1 PT Short Term Goal 1 (Week 1):  Patient will perform bed mobility with min A with our without use of bed rails. PT Short Term Goal 2 (Week 1): Patient will perform basic transfers with mod A >50% of the time. PT Short Term Goal 3 (Week 1): Patient will perform sit to stand with max A using  LRAD. PT Short Term Goal 4 (Week 1): Patient will initiate w/c mobility.  Recommendations for other services: Neuropsych  Skilled Therapeutic Intervention Evaluation completed (see details above and below) with education on PT POC and goals and individual treatment initiated with focus on functional mobility/transfers, LE strength, dynamic sitting balance/coordination, and improved endurance with activity. Patient provided with 20"18" wheelchair with foam cushion and adjustments made to promote optimal seating posture and pressure distribution. Patient also provided with slide board for use in room.  Patient in bed upon PT arrival. Patient alert and agreeable to PT session. Patient reported 6/10 L shoulder pain during session, RN made aware. PT provided repositioning, rest breaks, and distraction as pain interventions throughout session. MD reviewed results of x-ray during evaluation and cleared patient for transfers with B prosthetics at this time.   Patient very eager to attempt transfer to [facility], reports that she had a BM last night in the bed pan and she would not like to repeat that experience. PT agreeable to attempt [facility] transfer pending assessment of patient's mobility.   Therapeutic Activity: Bed Mobility: Patient performed rolling R/L x3 to pull up pants at beginning of session and to doff pants and place/remove bed pan during session. Provided max cues for sequencing and attention and use of L hemi-body during rolling. Patient was continent of urine on the bed pan with total A for use of urinal as well. Performed peri-care and lower body clothing management with total A.  She performed supine to/from sit with max A to the R. Provided  verbal cues for transitioning through R side-lying, bringing her knees to chest to lift her legs on/off the bed, and sequencing/technique for pushing up to sitting. Patient sat EOB with supervision as PT doffed/donned B shrinker socks and donned/doffed B prosthetics with total A before and after transfer training. Patient able to initiate use of teach back method for donning prosthetics, but unable to sustain attention to complete her instructions to PT despite max cues.  Transfers: Patient attempted sit to/from stand x3 in the [place] with max A of 1 x1 and max A +2 x2 without patient clearing her hips from the w/c despite max cues/assist.  Patient performed a lateral scoot transfer bed>w/c to the R, initiating with mod A and progressing to max A +2 due to poor initiation with self limiting behaviors due to fear of falling and motor planning deficits.  She attempted a slide board transfer to the [facility], but transfer was terminated due to safety concerns despite +3 assist, as patient was unable to motor plan and follow cues for the transfer causing the board to slide off the [facility].  She performed slide board transfer w/c>bed with max A of 1 and mod-min A of a second person and total A for board placement. Provided cues for hand placement, board placement, sequencing, and head-hips relationship for proper technique and decreased assist with transfers.   Instructed pt in results of PT evaluation as detailed above, PT POC, rehab potential, rehab goals, and discharge recommendations. Additionally discussed CIR's policies regarding fall safety and use of chair alarm and/or quick release belt. Pt verbalized understanding and in agreement. Will update pt's family members as they become available.   Patient in bed at end of session with breaks locked, bed alarm set, 4 rails up per patient request, and all needs within reach.    Discharge Criteria: Patient will be discharged from PT if patient refuses treatment 3  consecutive times without medical reason, if  treatment goals not met, if there is a change in medical status, if patient makes no progress towards goals or if patient is discharged from hospital.  The above assessment, treatment plan, treatment alternatives and goals were discussed and mutually agreed upon: by patient  Cyndie Woodbeck L Lupita Rosales PT, DPT, NCS, CBIS  03/26/2022, 12:52 PM

## 2022-03-26 NOTE — Progress Notes (Addendum)
PROGRESS NOTE   Subjective/Complaints:  Slept ok last noc, reports cont stool on bedpan last noc  Reports fall PTA with Left hip and shoulder pain since that time  ROS- neg CP, SOB, N/V/D  Objective:   No results found. Recent Labs    03/24/22 1019 03/25/22 0222  WBC 8.9 8.7  HGB 12.0 11.7*  HCT 37.4 36.3  PLT 217 234   Recent Labs    03/24/22 1019  NA 136  K 4.0  CL 93*  CO2 27  GLUCOSE 144*  BUN 30*  CREATININE 6.77*  CALCIUM 9.9    Intake/Output Summary (Last 24 hours) at 03/26/2022 0905 Last data filed at 03/26/2022 0741 Gross per 24 hour  Intake 240 ml  Output --  Net 240 ml        Physical Exam: Vital Signs Blood pressure (!) 95/52, pulse 81, temperature 98 F (36.7 C), temperature source Oral, resp. rate 18, weight 107.7 kg, SpO2 94 %.   General: No acute distress Mood and affect are appropriate Heart: Regular rate and rhythm no rubs murmurs or extra sounds Lungs: Clear to auscultation, breathing unlabored, no rales or wheezes Abdomen: Positive bowel sounds, soft nontender to palpation, nondistended Extremities: No clubbing, cyanosis, or edema Skin: No evidence of breakdown, no evidence of rash Neurologic: Cranial nerves II through XII intact, motor strength is 5/5 in right deltoid, bicep, tricep, grip, hip flexor, knee extensors, 0/5 left delt, biceps, 2- triceps and finger flexors, 0/5 hip flexors and 3- knee flexors and extensors Sensory exam normal sensation to light touch and proprioception in right upper and lower extremities, absent LT sensation LUE, intact LLE Cerebellar exam normal finger to nose to finger as well as heel to shin in bilateral upper and lower extremities Musculoskeletal: no pain with left hip ROM, mild pain with Left shoulder ROM, mild tenderness over Left AC jt area, bilateral BKA well healed    Assessment/Plan: 1. Functional deficits which require 3+ hours per day  of interdisciplinary therapy in a comprehensive inpatient rehab setting. Physiatrist is providing close team supervision and 24 hour management of active medical problems listed below. Physiatrist and rehab team continue to assess barriers to discharge/monitor patient progress toward functional and medical goals  Care Tool:  Bathing    Body parts bathed by patient: Left arm, Chest, Abdomen, Right upper leg, Face   Body parts bathed by helper: Front perineal area, Buttocks, Right arm Body parts n/a: Left lower leg, Right lower leg   Bathing assist Assist Level: Maximal Assistance - Patient 24 - 49%     Upper Body Dressing/Undressing Upper body dressing   What is the patient wearing?: Pull over shirt    Upper body assist Assist Level: Maximal Assistance - Patient 25 - 49%    Lower Body Dressing/Undressing Lower body dressing      What is the patient wearing?: Pants     Lower body assist Assist for lower body dressing: Dependent - Patient 0%     Toileting Toileting    Toileting assist Assist for toileting: 2 Helpers     Transfers Chair/bed transfer  Transfers assist  Chair/bed transfer activity did not occur: Safety/medical concerns  Locomotion Ambulation   Ambulation assist              Walk 10 feet activity   Assist           Walk 50 feet activity   Assist           Walk 150 feet activity   Assist           Walk 10 feet on uneven surface  activity   Assist           Wheelchair     Assist               Wheelchair 50 feet with 2 turns activity    Assist            Wheelchair 150 feet activity     Assist          Blood pressure (!) 95/52, pulse 81, temperature 98 F (36.7 C), temperature source Oral, resp. rate 18, weight 107.7 kg, SpO2 94 %.  Medical Problem List and Plan: 1. Functional deficits secondary to RIght MCA cardioembolic infarct              -patient may  shower              -ELOS/Goals: MinA mobility and ADL, use R BKA prothetic to transfer to [facility] , sliding board 2.  Antithrombotics: -DVT/anticoagulation:  Pharmaceutical: Eliquis             -antiplatelet therapy: N/A 3. Pain Management: Tylenol prn.  4. Mood/Behavior/Sleep: LCSW to follow for evaluation and support             -continue Elavil at bedtime for insomnia.              -antipsychotic agents: N/A 5. Neuropsych/cognition: This patient is capable of making decisions on her own behalf. 6. Skin/Wound Care: Routine pressure relief measures.  7. Fluids/Electrolytes/Nutrition: Strict I/O. Add 1200 cc FR/day.             --daily weights.  8. New onset A fib w/RVR: Monitor HR TID continue Eliquis. 9. T2DM: Hgb A1C- 6.0 and well controlled. Was on insulin Glargine 18 units with 5 units TID meal coverage.  CBG (last 3)  Recent Labs    03/24/22 2305 03/25/22 0344 03/25/22 1224  GLUCAP 140* 95 89   Controlled 8/13 10. ESRD: HD TTS at the end of the day to help with tolerance of therapy during the day.  11. Small thyroid nodule: Follow up with PCP after discharge for further work up.  12.  Patchy/nodular chest densities: CT chest recommended for follow-up 13. OSA: Continue BIPAP when asleep.   18.  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  Fall PTA with Left hip and shoulder pain check xrays although exam appears mainly consistent with post CVA shouolder pain as well as Left hip troch bursitis LOS: 1 days A FACE TO FACE EVALUATION WAS PERFORMED  Cindy Osborne 03/26/2022, 9:05 AM

## 2022-03-26 NOTE — Progress Notes (Signed)
[facility] Progress Note   Subjective:   Now in CIR. Reports she feels pressure in her chest immediately after drinking water, no SOB or chest pain any other time. Also reports pears were hard to eat yesterday. Denies palpitations, dizziness and nausea.   Objective Vitals:   03/25/22 1357 03/25/22 1426 03/25/22 1950 03/26/22 0342  BP: (!) 142/63  (!) 151/64 (!) 95/52  Pulse: 89  86 81  Resp: 18  20 18   Temp: 97.6 F (36.4 C)  98.4 F (36.9 C) 98 F (36.7 C)  TempSrc: Oral  Oral Oral  SpO2: 97%  96% 94%  Weight:  105.1 kg  107.7 kg   Physical Exam General: Alert female in NAD Heart: RRR, no murmurs, rubs or gallops Lungs: CTA bilaterally without wheezing, rhonchi or rales Abdomen: Soft, non-distended, +BS Extremities: No edema b/l lower extremities Dialysis Access:  LUE AVF + bruit  Additional Objective Labs: Basic Metabolic Panel: Recent Labs  Lab 03/21/22 1320 03/21/22 1322 03/21/22 1826 03/24/22 1019  NA 139 138  --  136  K 5.1 4.8  --  4.0  CL 99 103  --  93*  CO2 22  --   --  27  GLUCOSE 193* 187*  --  144*  BUN 71* 62*  --  30*  CREATININE 10.25* 11.30*  --  6.77*  CALCIUM 10.0  --   --  9.9  PHOS  --   --  6.0* 5.3*   Liver Function Tests: Recent Labs  Lab 03/21/22 1320 03/24/22 1019  AST 44*  --   ALT 18  --   ALKPHOS 151*  --   BILITOT 0.9  --   PROT 8.4*  --   ALBUMIN 3.7 3.2*   No results for input(s): "LIPASE", "AMYLASE" in the last 168 hours. CBC: Recent Labs  Lab 03/21/22 1320 03/21/22 1322 03/24/22 1019 03/25/22 0222  WBC 13.8*  --  8.9 8.7  NEUTROABS 11.8*  --   --   --   HGB 12.8 13.9 12.0 11.7*  HCT 39.6 41.0 37.4 36.3  MCV 86.7  --  86.8 86.6  PLT 227  --  217 234   Blood Culture No results found for: "SDES", "SPECREQUEST", "CULT", "REPTSTATUS"  Cardiac Enzymes: No results for input(s): "CKTOTAL", "CKMB", "CKMBINDEX", "TROPONINI" in the last 168 hours. CBG: Recent Labs  Lab 03/24/22 1656  03/24/22 2055 03/24/22 2305 03/25/22 0344 03/25/22 1224  GLUCAP 141* 186* 140* 95 89   Iron Studies: No results for input(s): "IRON", "TIBC", "TRANSFERRIN", "FERRITIN" in the last 72 hours. @lablastinr3 @ Studies/Results: DG HIP UNILAT WITH PELVIS 2-3 VIEWS LEFT  Result Date: 03/26/2022 CLINICAL DATA:  Acute hip pain EXAM: DG HIP (WITH OR WITHOUT PELVIS) 2-3V LEFT COMPARISON:  None FINDINGS: No fracture or dislocation identified to explain the patient's pain. Mild degenerative changes in the left hip without loss of joint space. No other significant abnormalities. IMPRESSION: No cause for left hip pain identified. Electronically Signed   By: Dorise Bullion III M.D.   On: 03/26/2022 10:37   DG Shoulder 1V Left  Result Date: 03/26/2022 CLINICAL DATA:  144315; pain EXAM: LEFT SHOULDER COMPARISON:  None Available. FINDINGS: There is no evidence of fracture or dislocation. Mild-to-moderate degenerative/hypertrophic changes of the [facility] joint. No other focal bone abnormality. Soft tissues are unremarkable. IMPRESSION: No fracture or dislocation. Mild-to-moderate degenerative changes of the [facility] joint. Electronically Signed   By: Frazier Richards M.D.   On: 03/26/2022 10:18   Medications:  amitriptyline  25 mg Oral QHS   apixaban  5 mg Oral BID   metoprolol succinate  200 mg Oral Daily   pravastatin  40 mg Oral q1800    Dialysis Orders: [facility] ([facility] [phone])   4h  104.3kg  400/600  2/2.5 bath  Heparin 2000 LUA AVF - mircera 150 ug q2, last 8/5, last Hb 9.7 - calcitriol 0.5 ug tiw - sensipar 90 po tiw    Assessment/Plan: Acute CVA - sp intervention 8/8. Was here in June for similar event. Per stroke team. Now with PAF, cardiology on board. In CIR Acute resp failure w/ hypoxemia -  Resolved, On RA this am. Reports SOB/chest pressure when drinking water but seems more consistent with swallowing issue than volume overload. Consider speech eval Volume- didn't tolerate vol removal w/  HD. suspect CXR  may have been poor quality, vs neurogenic pulm edema not related to vol overload.  ESRD - on HD TTS. Treatment was shortened Thursday due to a.fib RVR and nausea. Tolerated well yesterday with net UF 55ml. Will need new EDW at discharge HTN - BP was running high, soft today. Metoprolol resumed per cardiology Anemia of CKD - Hgb 11.7.  Esa last given 8/5, no esa needs.  7.   MBD ckd - CCa high, VDRA on hold. Typically takes sensipar, will resume. Phos is at goal.      Anice Paganini, PA-C 03/26/2022, 11:09 AM  [facility] Pager: [phone]

## 2022-03-27 ENCOUNTER — Other Ambulatory Visit (HOSPITAL_COMMUNITY): Payer: Self-pay

## 2022-03-27 ENCOUNTER — Telehealth (HOSPITAL_COMMUNITY): Payer: Self-pay | Admitting: Pharmacy Technician

## 2022-03-27 DIAGNOSIS — M19012 Primary osteoarthritis, left shoulder: Secondary | ICD-10-CM

## 2022-03-27 DIAGNOSIS — Z992 Dependence on renal dialysis: Secondary | ICD-10-CM

## 2022-03-27 DIAGNOSIS — E669 Obesity, unspecified: Secondary | ICD-10-CM

## 2022-03-27 DIAGNOSIS — E1169 Type 2 diabetes mellitus with other specified complication: Secondary | ICD-10-CM | POA: Diagnosis not present

## 2022-03-27 DIAGNOSIS — N186 End stage renal disease: Secondary | ICD-10-CM

## 2022-03-27 DIAGNOSIS — I63511 Cerebral infarction due to unspecified occlusion or stenosis of right middle cerebral artery: Secondary | ICD-10-CM | POA: Diagnosis not present

## 2022-03-27 LAB — CBC WITH DIFFERENTIAL/PLATELET
Abs Immature Granulocytes: 0.03 10*3/uL (ref 0.00–0.07)
Basophils Absolute: 0 10*3/uL (ref 0.0–0.1)
Basophils Relative: 1 %
Eosinophils Absolute: 0.4 10*3/uL (ref 0.0–0.5)
Eosinophils Relative: 6 %
HCT: 37.3 % (ref 36.0–46.0)
Hemoglobin: 11.5 g/dL — ABNORMAL LOW (ref 12.0–15.0)
Immature Granulocytes: 0 %
Lymphocytes Relative: 27 %
Lymphs Abs: 2.1 10*3/uL (ref 0.7–4.0)
MCH: 27.3 pg (ref 26.0–34.0)
MCHC: 30.8 g/dL (ref 30.0–36.0)
MCV: 88.6 fL (ref 80.0–100.0)
Monocytes Absolute: 0.9 10*3/uL (ref 0.1–1.0)
Monocytes Relative: 12 %
Neutro Abs: 4.1 10*3/uL (ref 1.7–7.7)
Neutrophils Relative %: 54 %
Platelets: 238 10*3/uL (ref 150–400)
RBC: 4.21 MIL/uL (ref 3.87–5.11)
RDW: 18.1 % — ABNORMAL HIGH (ref 11.5–15.5)
WBC: 7.5 10*3/uL (ref 4.0–10.5)
nRBC: 0 % (ref 0.0–0.2)

## 2022-03-27 LAB — COMPREHENSIVE METABOLIC PANEL
ALT: 15 U/L (ref 0–44)
AST: 16 U/L (ref 15–41)
Albumin: 2.9 g/dL — ABNORMAL LOW (ref 3.5–5.0)
Alkaline Phosphatase: 110 U/L (ref 38–126)
Anion gap: 14 (ref 5–15)
BUN: 35 mg/dL — ABNORMAL HIGH (ref 6–20)
CO2: 27 mmol/L (ref 22–32)
Calcium: 9.4 mg/dL (ref 8.9–10.3)
Chloride: 95 mmol/L — ABNORMAL LOW (ref 98–111)
Creatinine, Ser: 7.93 mg/dL — ABNORMAL HIGH (ref 0.44–1.00)
GFR, Estimated: 6 mL/min — ABNORMAL LOW (ref >60)
Glucose, Bld: 138 mg/dL — ABNORMAL HIGH (ref 70–99)
Potassium: 4.1 mmol/L (ref 3.5–5.1)
Sodium: 136 mmol/L (ref 135–145)
Total Bilirubin: 0.7 mg/dL (ref 0.3–1.2)
Total Protein: 6.6 g/dL (ref 6.5–8.1)

## 2022-03-27 LAB — GLUCOSE, CAPILLARY
Glucose-Capillary: 141 mg/dL — ABNORMAL HIGH (ref 70–99)
Glucose-Capillary: 246 mg/dL — ABNORMAL HIGH (ref 70–99)

## 2022-03-27 MED ORDER — KIDNEY FAILURE BOOK
Freq: Once | Status: AC
Start: 2022-03-27 — End: 2022-03-27

## 2022-03-27 MED ORDER — OFF THE BEAT BOOK
Freq: Once | Status: AC
  Filled 2022-03-27: qty 1

## 2022-03-27 MED ORDER — INSULIN ASPART 100 UNIT/ML IJ SOLN
0.0000 [IU] | Freq: Every day | INTRAMUSCULAR | Status: DC
  Administered 2022-03-27 – 2022-03-31 (×2): 2 [IU] via SUBCUTANEOUS
  Administered 2022-04-01: 5 [IU] via SUBCUTANEOUS
  Administered 2022-04-03 – 2022-04-08 (×3): 2 [IU] via SUBCUTANEOUS

## 2022-03-27 MED ORDER — BLOOD PRESSURE CONTROL BOOK
Freq: Once | Status: AC
  Filled 2022-03-27: qty 1

## 2022-03-27 MED ORDER — LIVING WELL WITH DIABETES BOOK
Freq: Once | Status: AC
Start: 2022-03-27 — End: 2022-03-27
  Filled 2022-03-27: qty 1

## 2022-03-27 MED ORDER — ACETAMINOPHEN 325 MG PO TABS: 325.0000 mg | ORAL_TABLET | ORAL | Status: DC | PRN

## 2022-03-27 MED ORDER — INSULIN ASPART 100 UNIT/ML IJ SOLN
0.0000 [IU] | Freq: Three times a day (TID) | INTRAMUSCULAR | Status: DC
  Administered 2022-03-29 – 2022-04-08 (×17): 1 [IU] via SUBCUTANEOUS
  Administered 2022-04-09: 2 [IU] via SUBCUTANEOUS
  Administered 2022-04-10: 1 [IU] via SUBCUTANEOUS

## 2022-03-27 MED ORDER — CHLORHEXIDINE GLUCONATE CLOTH 2 % EX PADS
6.0000 | MEDICATED_PAD | Freq: Every day | CUTANEOUS | Status: DC
  Administered 2022-03-27 – 2022-04-03 (×8): 6 via TOPICAL

## 2022-03-27 MED ORDER — TRAMADOL HCL 50 MG PO TABS
25.0000 mg | ORAL_TABLET | Freq: Four times a day (QID) | ORAL | Status: DC | PRN
  Administered 2022-03-27 – 2022-04-08 (×6): 50 mg via ORAL
  Administered 2022-04-10: 25 mg via ORAL
  Filled 2022-03-27 (×7): qty 1

## 2022-03-27 NOTE — Progress Notes (Signed)
Inpatient Rehabilitation Care Coordinator Assessment and Plan Patient Details  Name: Cindy Osborne MRN: [id number] Date of Birth: Oct 10, 1965  Today's Date: 03/27/2022  Hospital Problems: Principal Problem:   Acute ischemic right MCA stroke [facility])  Past Medical History:  Past Medical History:  Diagnosis Date   Amputated toe ([place]) 01/31/2012   Right second toe distal phalanx Right great toe   Diabetes mellitus without complication ([place])    History of diabetic ulcer of foot 04/01/2014   Hyperlipidemia    Hypertension    Stroke ([place]) 01/21/2022   Past Surgical History:  Past Surgical History:  Procedure Laterality Date   angioplasty politeal Left 07/2020   avg for dialysis     CHOLECYSTECTOMY     COLONOSCOPY  08/15/2011   cleared for 10 yrs   IR CT HEAD LTD  01/21/2022   IR CT HEAD LTD  03/21/2022   IR PERCUTANEOUS ART THROMBECTOMY/INFUSION INTRACRANIAL INC DIAG ANGIO  01/21/2022   IR PERCUTANEOUS ART THROMBECTOMY/INFUSION INTRACRANIAL INC DIAG ANGIO  03/21/2022   IR US GUIDE VASC ACCESS RIGHT  01/21/2022   IR US GUIDE VASC ACCESS RIGHT  03/21/2022   LEG AMPUTATION BELOW KNEE Right 04/2019   LEG AMPUTATION BELOW KNEE Left 09/08/2020   RADIOLOGY WITH ANESTHESIA N/A 01/21/2022   Procedure: IR WITH ANESTHESIA;  Surgeon: Radiologist, Medication, MD;  Location: [place];  Service: Radiology;  Laterality: N/A;   RADIOLOGY WITH ANESTHESIA N/A 03/21/2022   Procedure: IR WITH ANESTHESIA;  Surgeon: Radiologist, Medication, MD;  Location: [place];  Service: Radiology;  Laterality: N/A;   TOE AMPUTATION Right 10/15/2018   Social History:  reports that she has never smoked. She has never used smokeless tobacco. She reports current alcohol use. She reports that she does not use drugs.  Family / Support Systems Other Supports: sister, Ivin Booty and Mathews Argyle (Niece) Anticipated Caregiver: mom, sister and family Ability/Limitations of Caregiver: Mother is 6 and only able to provide supervision. Sister is  CNA and work T-Thurs. Nieces able to provie assistance/suepervision Family Dynamics: support from mother, sister and niece  Social History Preferred language: English Religion: Unknown Health Literacy - How often do you need to have someone help you when you read instructions, pamphlets, or other written material from your doctor or pharmacy?: Never Writes: Yes   Abuse/Neglect Abuse/Neglect Assessment Can Be Completed: Yes Physical Abuse: Denies Verbal Abuse: Denies Sexual Abuse: Denies Exploitation of patient/patient's resources: Denies Self-Neglect: Denies  Patient response to: Social Isolation - How often do you feel lonely or isolated from those around you?: Never  Emotional Status Recent Psychosocial Issues: coping Psychiatric History: n/a Substance Abuse History: n/a  Patient / Family Perceptions, Expectations & Goals Pt/Family understanding of illness & functional limitations: yes Premorbid pt/family roles/activities: Alone, 50 y.o mother was assisting (from [place]) and sister was assisting and driving self to HD Anticipated changes in roles/activities/participation: Mother able to provide supervision. Sister and niece able to provide some assistance at d/c Pt/family expectations/goals: supervision (slideboard and WC) Min A (ADLS)  Community Resources Premorbid Home Care/DME Agencies: Other (Comment) ([address] [place], [place], [facility], Wheelchair, [facility]) Transportation available at discharge: sister or niece able to transport Is the patient able to respond to transportation needs?: Yes In the past 12 months, has lack of transportation kept you from medical appointments or from getting medications?: No In the past 12 months, has lack of transportation kept you from meetings, work, or from getting things needed for daily living?: No  Discharge Planning Living Arrangements: Alone Support Systems:  Other relatives, Parent (mother, sister and niece) Type of Residence: Private  residence Insurance Resources: Chartered certified accountant Resources: Family Support Financial Screen Referred: No Living Expenses: Own Money Management: Patient Does the patient have any problems obtaining your medications?: No Home Management: Independent Patient/Family Preliminary Plans: family able to provide assistance if needed Care Coordinator Barriers to Discharge: Decreased caregiver support, Lack of/limited family support, Hemodialysis Care Coordinator Anticipated Follow Up Needs: HH/OP DC Planning Additional Notes/Comments: Ramped entrance Expected length of stay: 14-20 Days  Clinical Impression SW met with patient, mother, sister and niece. Introduced self and explained role. Patient anticipates discharging home with [facility]. Patient mother only able to provide supervision and potentially need [facility] resources for assistance.Family able to transport.  Family prefers that we call patient mother to provide conference updates, patients contact updated. No additional questions or concerns, sw will continue to follow up.   Dyanne Iha 03/27/2022, 12:47 PM

## 2022-03-27 NOTE — Progress Notes (Signed)
Inpatient [place] Individual Statement of Services  Patient Name:  Cindy Osborne  Date:  03/27/2022  Welcome to the [facility].  Our goal is to provide you with an individualized program based on your diagnosis and situation, designed to meet your specific needs.  With this comprehensive rehabilitation program, you will be expected to participate in at least 3 hours of rehabilitation therapies Monday-Friday, with modified therapy programming on the weekends.  Your rehabilitation program will include the following services:  Physical Therapy (PT), Occupational Therapy (OT), Speech Therapy (ST), 24 hour per day rehabilitation nursing, Therapeutic Recreaction (TR), Neuropsychology, Care Coordinator, Rehabilitation Medicine, Nutrition Services, Pharmacy Services, and Other  Weekly team conferences will be held on Wednesdays to discuss your progress.  Your Inpatient Rehabilitation Care Coordinator will talk with you frequently to get your input and to update you on team discussions.  Team conferences with you and your family in attendance may also be held.  Expected length of stay: 14-20 Days  Overall anticipated outcome:  Supervision to Min A  Depending on your progress and recovery, your program may change. Your Inpatient Rehabilitation Care Coordinator will coordinate services and will keep you informed of any changes. Your Inpatient Rehabilitation Care Coordinator's name and contact numbers are listed  below.  The following services may also be recommended but are not provided by the [facility]:   [place] will be made to provide these services after discharge if needed.  Arrangements include referral to agencies that provide these services.  Your insurance has been verified to be:  Medicare A & B Your primary doctor is:  Halina Maidens, MD  Pertinent  information will be shared with your doctor and your insurance company.  Inpatient Rehabilitation Care Coordinator:  Erlene Quan, [place] or [phone]  Information discussed with and copy given to patient by: Dyanne Iha, 03/27/2022, 11:09 AM

## 2022-03-27 NOTE — Progress Notes (Signed)
Occupational Therapy Session Note  Patient Details  Name: Cindy Osborne MRN: [id number] Date of Birth: [birth year]  Today's Date: 03/27/2022 OT Individual Time: 1545-1630 OT Individual Time Calculation (min): 45 min    Short Term Goals: Week 1:  OT Short Term Goal 1 (Week 1): Patient willl perform toilet transfer with max A of 1 OT Short Term Goal 2 (Week 1): Patient demonstrated impoved awareness of L UE by positioning prior to transfer with min verbal cues. OT Short Term Goal 3 (Week 1): Patient will complete maintain sitting balance at EOB with close supervision  Skilled Therapeutic Interventions/Progress Updates:    Upon OT arrival, pt seated in w/c requesting to "go on the toilet". Pt agreeable to OT treatment session and reports no pain. Clarise Cruz steady was used to complete transfer from w/c, to and from toilet, and to bed dependently. Pt was instructed to stand tall during toileting tasks and toilet transfer with active muscle response. Pt requires Total A for toileting and toilet transfer and pt did not void during session. Pt was returned to supine with Max A, prosthesis doffed with Total A and left in bed with all needs met and safety measures in place.   Therapy Documentation Precautions:  Precautions Precautions: Fall, Other (comment) Precaution Comments: L hemiplegia, Bil BKA 2 years ago and 1.5 years ago Required Braces or Orthoses: Other Brace Other Brace: bil prosthetic Restrictions Weight Bearing Restrictions: No   Therapy/Group: Individual Therapy  Marvetta Gibbons 03/27/2022, 5:01 PM

## 2022-03-27 NOTE — Progress Notes (Signed)
PROGRESS NOTE   Subjective/Complaints:  Up in bed. Family in room. Left shoulder sl tender. Ready to get back into therapy  ROS: Patient denies fever, rash, sore throat, blurred vision, dizziness, nausea, vomiting, diarrhea, cough, shortness of breath or chest pain, joint or back/neck pain, headache, or mood change.    Objective:   DG HIP UNILAT WITH PELVIS 2-3 VIEWS LEFT  Result Date: 03/26/2022 CLINICAL DATA:  Acute hip pain EXAM: DG HIP (WITH OR WITHOUT PELVIS) 2-3V LEFT COMPARISON:  None FINDINGS: No fracture or dislocation identified to explain the patient's pain. Mild degenerative changes in the left hip without loss of joint space. No other significant abnormalities. IMPRESSION: No cause for left hip pain identified. Electronically Signed   By: Dorise Bullion III M.D.   On: 03/26/2022 10:37   DG Shoulder 1V Left  Result Date: 03/26/2022 CLINICAL DATA:  [id number]; pain EXAM: LEFT SHOULDER COMPARISON:  None Available. FINDINGS: There is no evidence of fracture or dislocation. Mild-to-moderate degenerative/hypertrophic changes of the [facility] joint. No other focal bone abnormality. Soft tissues are unremarkable. IMPRESSION: No fracture or dislocation. Mild-to-moderate degenerative changes of the [facility] joint. Electronically Signed   By: Frazier Richards M.D.   On: 03/26/2022 10:18   Recent Labs    03/25/22 0222 03/27/22 0558  WBC 8.7 7.5  HGB 11.7* 11.5*  HCT 36.3 37.3  PLT 234 238   Recent Labs    03/27/22 0558  NA 136  K 4.1  CL 95*  CO2 27  GLUCOSE 138*  BUN 35*  CREATININE 7.93*  CALCIUM 9.4    Intake/Output Summary (Last 24 hours) at 03/27/2022 1024 Last data filed at 03/27/2022 0345 Gross per 24 hour  Intake 480 ml  Output 1650 ml  Net -1170 ml        Physical Exam: Vital Signs Blood pressure 130/71, pulse 78, temperature 97.6 F (36.4 C), temperature source Oral, resp. rate 18, weight 102.2 kg, SpO2 99  %.   Constitutional: No distress . Vital signs reviewed. HEENT: NCAT, EOMI, oral membranes moist Neck: supple Cardiovascular: RRR without murmur. No JVD    Respiratory/Chest: CTA Bilaterally without wheezes or rales. Normal effort    GI/Abdomen: BS +, non-tender, non-distended Ext: no clubbing, cyanosis, or edema Psych: pleasant and cooperative  Skin: No evidence of breakdown, no evidence of rash Neurologic: Cranial nerves II through XII intact, motor strength is 5/5 in right deltoid, bicep, tricep, grip, hip flexor, knee extensors, 1-2/5 left delt, biceps, 2-triceps and finger flexors, 1/5 hip flexors and 3 knee flexors and extensors Sensory exam normal sensation to light touch and proprioception in right upper and lower extremities, absent LT sensation LUE, intact LLE Cerebellar exam normal finger to nose to finger as well as heel to shin in bilateral upper and lower extremities Musculoskeletal: no pain with left hip ROM, mild pain with Left shoulder ROM, mild tenderness over Left AC jt area, bilateral BKA well healed. Both bk stumps well shaped. Full PROM.    Assessment/Plan: 1. Functional deficits which require 3+ hours per day of interdisciplinary therapy in a comprehensive inpatient rehab setting. Physiatrist is providing close team supervision and 24 hour management of active medical  problems listed below. Physiatrist and rehab team continue to assess barriers to discharge/monitor patient progress toward functional and medical goals  Care Tool:  Bathing    Body parts bathed by patient: Left arm, Chest, Abdomen, Right upper leg, Face   Body parts bathed by helper: Front perineal area, Buttocks, Right arm Body parts n/a: Left lower leg, Right lower leg   Bathing assist Assist Level: Maximal Assistance - Patient 24 - 49%     Upper Body Dressing/Undressing Upper body dressing   What is the patient wearing?: Pull over shirt    Upper body assist Assist Level: Maximal  Assistance - Patient 25 - 49%    Lower Body Dressing/Undressing Lower body dressing      What is the patient wearing?: Pants     Lower body assist Assist for lower body dressing: Dependent - Patient 0%     Toileting Toileting    Toileting assist Assist for toileting: 2 Helpers     Transfers Chair/bed transfer  Transfers assist  Chair/bed transfer activity did not occur: Safety/medical concerns  Chair/bed transfer assist level: 2 Helpers     Locomotion Ambulation   Ambulation assist   Ambulation activity did not occur: Safety/medical concerns          Walk 10 feet activity   Assist  Walk 10 feet activity did not occur: Safety/medical concerns        Walk 50 feet activity   Assist Walk 50 feet with 2 turns activity did not occur: Safety/medical concerns         Walk 150 feet activity   Assist Walk 150 feet activity did not occur: Safety/medical concerns         Walk 10 feet on uneven surface  activity   Assist Walk 10 feet on uneven surfaces activity did not occur: Safety/medical concerns         Wheelchair     Assist Is the patient using a wheelchair?: Yes Type of Wheelchair: Manual    Wheelchair assist level: Dependent - Patient 0% Max wheelchair distance: >150 feet    Wheelchair 50 feet with 2 turns activity    Assist        Assist Level: Dependent - Patient 0%   Wheelchair 150 feet activity     Assist      Assist Level: Dependent - Patient 0%   Blood pressure 130/71, pulse 78, temperature 97.6 F (36.4 C), temperature source Oral, resp. rate 18, weight 102.2 kg, SpO2 99 %.  Medical Problem List and Plan: 1. Functional deficits secondary to RIght MCA cardioembolic infarct              -patient may  shower             -ELOS/Goals: MinA mobility and ADL, use R BKA prothetic to transfer to [facility] , sliding board 2.  Antithrombotics: -DVT/anticoagulation:  Pharmaceutical: Eliquis              -antiplatelet therapy: N/A 3. Pain Management: Tylenol prn.  4. Mood/Behavior/Sleep: LCSW to follow for evaluation and support             -continue Elavil at bedtime for insomnia.              -antipsychotic agents: N/A 5. Neuropsych/cognition: This patient is capable of making decisions on her own behalf. 6. Skin/Wound Care: Routine pressure relief measures.  7. Fluids/Electrolytes/Nutrition: Strict I/O. Add ed1200 cc FR/day.             --  daily weights.  8. New onset A fib w/RVR: Monitor HR TID continue Eliquis. 9. T2DM: Hgb A1C- 6.0 and well controlled. Was on insulin Glargine 18 units with 5 units TID meal coverage.  CBG (last 3)  Recent Labs    03/24/22 2305 03/25/22 0344 03/25/22 1224  GLUCAP 140* 95 89   Controlled 8/14 10. ESRD: HD TTS at the end of the day to help with tolerance of therapy during the day.  11. Small thyroid nodule: Follow up with PCP after discharge for further work up.  12.  Patchy/nodular chest densities: CT chest recommended for follow-up 13. OSA: Continue BIPAP when asleep.   61.  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  Fall PTA with Left hip and shoulder pain check xrays although exam appears mainly consistent with post CVA shouolder pain as well as Left hip troch bursitis  8/14-mild to moderate OA visible on xrays of left shoulder and pelvis    -add voltaren gel to shoulder and hip pain   LOS: 2 days A FACE TO FACE EVALUATION WAS PERFORMED  Meredith Staggers 03/27/2022, 10:24 AM

## 2022-03-27 NOTE — Progress Notes (Signed)
Inpatient Rehabilitation  Patient information reviewed and entered into eRehab system by Keyna Blizard Claudeen Leason, OTR/L.   Information including medical coding, functional ability and quality indicators will be reviewed and updated through discharge.    

## 2022-03-27 NOTE — Discharge Instructions (Addendum)
Inpatient Rehab Discharge Instructions  Cindy Osborne Discharge date and time:  04/10/22  Activities/Precautions/ Functional Status: Activity: no lifting, driving, or strenuous exercise till cleared by MD Diet: renal diet Limit fluids to 1200 cc/day Wound Care: none needed   Functional status:  ___ No restrictions     ___ Walk up steps independently _X__ 24/7 supervision/assistance   ___ Walk up steps with assistance ___ Intermittent supervision/assistance  ___ Bathe/dress independently ___ Walk with walker     _X__ Bathe/dress with assistance ___ Walk Independently    ___ Shower independently ___ Walk with assistance    ___ Shower with assistance _X__ No alcohol     ___ Return to work/school ________  Special Instructions:  STROKE/TIA DISCHARGE INSTRUCTIONS SMOKING Cigarette smoking nearly doubles your risk of having a stroke & is the single most alterable risk factor  If you smoke or have smoked in the last 12 months, you are advised to quit smoking for your health. Most of the excess cardiovascular risk related to smoking disappears within a year of stopping. Ask you doctor about anti-smoking medications [place] Quit Line: 1-800-QUIT NOW Free Smoking Cessation Classes [phone]  CHOLESTEROL Know your levels; limit fat & cholesterol in your diet  Lipid Panel     Component Value Date/Time   CHOL 107 03/22/2022 0243   CHOL 168 03/03/2021 0858   TRIG 84 03/22/2022 0243   HDL 35 (L) 03/22/2022 0243   HDL 35 (L) 03/03/2021 0858   CHOLHDL 3.1 03/22/2022 0243   VLDL 17 03/22/2022 0243   LDLCALC 55 03/22/2022 0243   LDLCALC 113 (H) 03/03/2021 0858     Many patients benefit from treatment even if their cholesterol is at goal. Goal: Total Cholesterol (CHOL) less than 160 Goal:  Triglycerides (TRIG) less than 150 Goal:  HDL greater than 40 Goal:  LDL (LDLCALC) less than 100   BLOOD PRESSURE American Stroke Association blood pressure target is less that 120/80 mm/Hg  Your  discharge blood pressure is:  BP: 130/71 Monitor your blood pressure Limit your salt and alcohol intake Many individuals will require more than one medication for high blood pressure  DIABETES (A1c is a blood sugar average for last 3 months) Goal HGBA1c is under 7% (HBGA1c is blood sugar average for last 3 months)  Diabetes:     Lab Results  Component Value Date   HGBA1C 6.0 (H) 01/22/2022    Your HGBA1c can be lowered with medications, healthy diet, and exercise. Check your blood sugar as directed by your physician Call your physician if you experience unexplained or low blood sugars.  PHYSICAL ACTIVITY/REHABILITATION Goal is 30 minutes at least 4 days per week  Activity: No driving, Therapies: see above Return to work: N/A Activity decreases your risk of heart attack and stroke and makes your heart stronger.  It helps control your weight and blood pressure; helps you relax and can improve your mood. Participate in a regular exercise program. Talk with your doctor about the best form of exercise for you (dancing, walking, swimming, cycling).  DIET/WEIGHT Goal is to maintain a healthy weight  Your discharge diet is:  Diet Order             DIET DYS 2 Room service appropriate? Yes; Fluid consistency: Thin; Fluid restriction: 1200 mL Fluid  Diet effective now                   liquids Your height is:  5'9" Your current weight is: Weight: 102.2  kg Your Body Mass Index (BMI) is:  BMI (Calculated): 33.26 Following the type of diet specifically designed for you will help prevent another stroke. You goal weight is   169 lbs Your goal Body Mass Index (BMI) is 19-24. Healthy food habits can help reduce 3 risk factors for stroke:  High cholesterol, hypertension, and excess weight.  RESOURCES Stroke/Support Group:  Call [phone]   STROKE EDUCATION PROVIDED/REVIEWED AND GIVEN TO PATIENT Stroke warning signs and symptoms How to activate emergency medical system (call  [phone]). Medications prescribed at discharge. Need for follow-up after discharge. Personal risk factors for stroke. Pneumonia vaccine given:  Flu vaccine given:  My questions have been answered, the writing is legible, and I understand these instructions.  I will adhere to these goals & educational materials that have been provided to me after my discharge from the hospital.      My questions have been answered and I understand these instructions. I will adhere to these goals and the provided educational materials after my discharge from the hospital.  Patient/Caregiver Signature _______________________________ Date __________  Clinician Signature _______________________________________ Date __________  Please bring this form and your medication list with you to all your follow-up doctor's appointments.  Information on my medicine - ELIQUIS (apixaban)  This medication education was reviewed with me or my healthcare representative as part of my discharge preparation.  Why was Eliquis prescribed for you? Eliquis was prescribed for you to reduce the risk of a blood clot forming that can cause a stroke if you have a medical condition called atrial fibrillation (a type of irregular heartbeat).  What do You need to know about Eliquis ? Take your Eliquis TWICE DAILY - one tablet in the morning and one tablet in the evening with or without food. If you have difficulty swallowing the tablet whole please discuss with your pharmacist how to take the medication safely.  Take Eliquis exactly as prescribed by your doctor and DO NOT stop taking Eliquis without talking to the doctor who prescribed the medication.  Stopping may increase your risk of developing a stroke.  Refill your prescription before you run out.  After discharge, you should have regular check-up appointments with your healthcare provider that is prescribing your Eliquis.  In the future your dose may need to be changed if your  kidney function or weight changes by a significant amount or as you get older.  What do you do if you miss a dose? If you miss a dose, take it as soon as you remember on the same day and resume taking twice daily.  Do not take more than one dose of ELIQUIS at the same time to make up a missed dose.  Important Safety Information A possible side effect of Eliquis is bleeding. You should call your healthcare provider right away if you experience any of the following: Bleeding from an injury or your nose that does not stop. Unusual colored urine (red or dark brown) or unusual colored stools (red or black). Unusual bruising for unknown reasons. A serious fall or if you hit your head (even if there is no bleeding).  Some medicines may interact with Eliquis and might increase your risk of bleeding or clotting while on Eliquis. To help avoid this, consult your healthcare provider or pharmacist prior to using any new prescription or non-prescription medications, including herbals, vitamins, non-steroidal anti-inflammatory drugs (NSAIDs) and supplements.  This website has more information on Eliquis (apixaban): http://[email or web address].com/eliquis/home

## 2022-03-27 NOTE — Progress Notes (Signed)
Occupational Therapy Session Note  Patient Details  Name: Cindy Osborne MRN: [id number] Date of Birth: October 12, 1965  Today's Date: 03/27/2022 OT Individual Time: 1000-1045 OT Individual Time Calculation (min): 45 min    Short Term Goals: Week 1:  OT Short Term Goal 1 (Week 1): Patient willl perform toilet transfer with max A of 1 OT Short Term Goal 2 (Week 1): Patient demonstrated impoved awareness of L UE by positioning prior to transfer with min verbal cues. OT Short Term Goal 3 (Week 1): Patient will complete maintain sitting balance at EOB with close supervision  Skilled Therapeutic Interventions/Progress Updates:   Upon OT arrival, pt supine in bed reporting no pain and is agreeable to OT treatment session. Treatment intervention with a focus on self care training. Pt dons pants bed level with Max A and performs rolling to the L with Mod A and to the R with Max A for pants management. Pt completes sidelying to sit transfer with Max A and sits EOB with SBA. Pt dons shirt with Mod A and verbal cues to sequence. Pt dons R prosthesis with setup assist and the L shrinker was unable to be located to don L prosthesis. Pt sits EOB to brush her teeth with Setup assist  Pt was returned to supine with Max A x 2 and left in bed with all needs met and safety measures in place.   Therapy Documentation Precautions:  Precautions Precautions: Fall, Other (comment) Precaution Comments: L hemiplegia, Bil BKA 2 years ago and 1.5 years ago Required Braces or Orthoses: Other Brace Other Brace: bil prosthetic Restrictions Weight Bearing Restrictions: No   ADL: Grooming: Setup Where Assessed-Grooming: Edge of bed Upper Body Dressing: Moderate assistance Where Assessed-Upper Body Dressing: Edge of bed Lower Body Dressing: Moderate assistance Where Assessed-Lower Body Dressing: Edge of bed, Bed level    Therapy/Group: Individual Therapy  Marvetta Gibbons 03/27/2022, 11:31 AM

## 2022-03-27 NOTE — Evaluation (Signed)
Speech Language Pathology Assessment and Plan  Patient Details  Name: Cindy Osborne MRN: [id number] Date of Birth: [birth year]  SLP Diagnosis: Dysarthria;Dysphagia;Cognitive Impairments;Speech and Language deficits  Rehab Potential: Good ELOS: 21-24 days   Today's Date: 03/27/2022 SLP Individual Time: 9702-6378 SLP Individual Time Calculation (min): 58 min  Hospital Problem: Principal Problem:   Acute ischemic right MCA stroke [facility])  Past Medical History:  Past Medical History:  Diagnosis Date   Amputated toe ([place]) 01/31/2012   Right second toe distal phalanx Right great toe   Diabetes mellitus without complication ([place])    History of diabetic ulcer of foot 04/01/2014   Hyperlipidemia    Hypertension    Stroke ([place]) 01/21/2022   Past Surgical History:  Past Surgical History:  Procedure Laterality Date   angioplasty politeal Left 07/2020   avg for dialysis     CHOLECYSTECTOMY     COLONOSCOPY  08/15/2011   cleared for 10 yrs   IR CT HEAD LTD  01/21/2022   IR CT HEAD LTD  03/21/2022   IR PERCUTANEOUS ART THROMBECTOMY/INFUSION INTRACRANIAL INC DIAG ANGIO  01/21/2022   IR PERCUTANEOUS ART THROMBECTOMY/INFUSION INTRACRANIAL INC DIAG ANGIO  03/21/2022   IR US GUIDE VASC ACCESS RIGHT  01/21/2022   IR US GUIDE VASC ACCESS RIGHT  03/21/2022   LEG AMPUTATION BELOW KNEE Right 04/2019   LEG AMPUTATION BELOW KNEE Left 09/08/2020   RADIOLOGY WITH ANESTHESIA N/A 01/21/2022   Procedure: IR WITH ANESTHESIA;  Surgeon: Radiologist, Medication, MD;  Location: [place];  Service: Radiology;  Laterality: N/A;   RADIOLOGY WITH ANESTHESIA N/A 03/21/2022   Procedure: IR WITH ANESTHESIA;  Surgeon: Radiologist, Medication, MD;  Location: [place];  Service: Radiology;  Laterality: N/A;   TOE AMPUTATION Right 10/15/2018    Assessment / Plan / Recommendation Clinical Impression Cindy Osborne is a 56 year old RH female with history of HTN, T2DM, B-BKA, CVA 6/23 who was admitted on 03/21/22 after fall with  onset of left-sided weakness, left hemineglect and left facial droop with slurred speech. CTA head/Neck showed acute right M2/MCA posterior division occlusion. She underwent cerebral angio with mechanical thrombectomy of partial occlusions of 2 out of 3 MCA trifurcation with complete recanalization by Dr. Gerhard Perches.  MRI brain done revealing moderate-sized patchy acute ischemic right MCA infarct affecting the right frontal and parietal lobes with associated petechial hemorrhage and few scattered chronic ischemic infarcts.   Pt presents with a mild dysarthria characterized by articulatory imprecision and fast rate of speech at times without awareness nor attempts to self correct. Speech intelligibility perceived as >75% intelligible at the sentence level. Oral mechanism exam remarkable for mild-to-moderate facial asymmetry, L facial, labial, and lingual weakness. Expressive/receptive language appear grossly intact for all tasks assessed.  Oropharyngeal swallow was assessed with thin liquids, dys 2, and dys 3 textures. Pt presented with mildly prolonged mastication, mild oral residuals post initial swallow, trace-to-mild anterior spillage without awareness, trace-to-mild pocketing on L with awareness. Pt consumed solids and thin liquids via straw without pharyngeal symptoms concerning for airway invasion. Family arrived with [place] sandwich in which SLP utilized as further PO trials. Pt consumed bite sized pieces with reduced impulse control and tendency to take large bites. With min A verbal cues, pt was able to demonstrate appropriate bite sizes and rate of consumption. SLP educated pt/family on safe swallowing precautions and strategies. Family verbalized and demonstrated understanding while pt consumed snack. Family cleared to provide supervision during meals. SLP recommends a dysphagia 3 diet with thin liquids.  Medication may be given whole with liquid or with applesauce/pudding. Full supervision recommended  due to impulsive tendencies.   Pt denied changes to cognitive-communication skills. Family endorse questionable memory changes as evidenced by repeating same story in conversation. Per informal assessment, SLP questions extent of pt's awareness of deficits, decreased short-term memory as evidenced by mixing up therapists and decreased recall from previous therapy session, and left inattention. Further cognitive-linguistic evaluation is recommended. Unable to complete d/t time constraints.  Patient would benefit from skilled SLP intervention to maximize her speech, swallowing, and cognitive functioning and overall functional independence prior to discharge.   Skilled Therapeutic Interventions          Pt participated in evaluations of cognitive-linguistic, speech, language and swallow function. Please see above.     SLP Assessment  Patient will need skilled Speech Lanaguage Pathology Services during CIR admission    Recommendations  SLP Diet Recommendations: Dysphagia 3 (Mech soft);Thin Liquid Administration via: Spoon;Cup Medication Administration: Whole meds with liquid Supervision: Patient able to self feed Compensations: Slow rate;Small sips/bites;Minimize environmental distractions Postural Changes and/or Swallow Maneuvers: Seated upright 90 degrees Oral Care Recommendations: Oral care BID Patient destination: Home Follow up Recommendations: Outpatient SLP;Home Health SLP;24 hour supervision/assistance Equipment Recommended: None recommended by SLP    SLP Frequency 3 to 5 out of 7 days   SLP Duration  SLP Intensity  SLP Treatment/Interventions 21-24 days  Minumum of 1-2 x/day, 30 to 90 minutes  Cognitive remediation/compensation;Speech/Language facilitation;Dysphagia/aspiration precaution training;Functional tasks;Patient/family education;Therapeutic Activities;Internal/external aids    Pain Pain Assessment Pain Scale: 0-10 Pain Score: 0-No pain  Prior  Functioning Cognitive/Linguistic Baseline: Baseline deficits Baseline deficit details: 22/30 on [facility] on June 2023 evaluation and again on 03/22/2022 Type of Home: House  Lives With: Alone Available Help at Discharge: Family;Available 24 hours/day (mom and sister are very supportive) 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, systems Overall Cognitive Status: Impaired/Different from baseline Arousal/Alertness: Awake/alert Orientation Level: Oriented X4 Memory: Impaired Memory Impairment: Decreased recall of new information;Decreased short term memory Decreased Short Term Memory: Functional basic;Verbal basic Awareness: Impaired Awareness Impairment: Emergent impairment Problem Solving: Impaired Problem Solving Impairment: Verbal basic Behaviors: Impulsive Safety/Judgment: Impaired  Comprehension Auditory Comprehension Overall Auditory Comprehension: Appears within functional limits for tasks assessed Expression Expression Primary Mode of Expression: Verbal Verbal Expression Overall Verbal Expression: Appears within functional limits for tasks assessed Oral Motor Oral Motor/Sensory Function Overall Oral Motor/Sensory Function: Mild impairment (mild-to-mod) Facial ROM: Reduced left Facial Symmetry: Abnormal symmetry left Facial Strength: Reduced left Facial Sensation: Within Functional Limits Lingual ROM: Reduced left Lingual Symmetry: Abnormal symmetry left Lingual Strength: Reduced Lingual Sensation: Within Functional Limits Mandible: Within Functional Limits Motor Speech Overall Motor Speech: Impaired Respiration: Within functional limits Phonation: Normal Resonance: Within functional limits Articulation: Impaired Level of Impairment: Sentence Intelligibility: Intelligibility reduced Word: 75-100% accurate Phrase: 75-100% accurate Sentence: 75-100% accurate Conversation: 75-100% accurate Motor Planning: Witnin functional limits  Care  Tool Care Tool Cognition Ability to hear (with hearing aid or hearing appliances if normally used Ability to hear (with hearing aid or hearing appliances if normally used): 0. Adequate - no difficulty in normal conservation, social interaction, listening to TV   Expression of Ideas and Wants Expression of Ideas and Wants: 3. Some difficulty - exhibits some difficulty with expressing needs and ideas (e.g, some words or finishing thoughts) or speech is not clear   Understanding Verbal and Non-Verbal Content Understanding Verbal and Non-Verbal Content: 3. Usually understands - understands most conversations, but misses some  part/intent of message. Requires cues at times to understand  Memory/Recall Ability Memory/Recall Ability : Current season;Staff names and faces;That he or she is in a hospital/hospital unit   Intelligibility: Intelligibility reduced Word: 75-100% accurate Phrase: 75-100% accurate Sentence: 75-100% accurate Conversation: 75-100% accurate  Bedside Swallowing Assessment General Date of Onset: 03/21/22 Previous Swallow Assessment: 03/22/22 Diet Prior to this Study: Dysphagia 2 (chopped);Thin liquids Temperature Spikes Noted: No Respiratory Status: Room air History of Recent Intubation: Yes Length of Intubations (days): 1 days Date extubated: 03/21/22 Behavior/Cognition: Alert;Cooperative;Pleasant mood Oral Cavity - Dentition: Missing dentition Self-Feeding Abilities: Needs assist;Needs set up Vision: Functional for self-feeding Patient Positioning: Upright in bed Baseline Vocal Quality: Normal Volitional Cough: Strong Volitional Swallow: Able to elicit  Oral Care Assessment Oral Assessment  (WDL): Exceptions to WDL Lips: Asymmetrical Teeth: Missing (Comment) Tongue: Pink;Moist Mucous Membrane(s): Moist;Pink Saliva: Moist, saliva free flowing Level of Consciousness: Alert Is patient on any of following O2 devices?: None of the above Nutritional status:  Dysphagia Oral Assessment Risk : High Risk Ice Chips Ice chips: Not tested Thin Liquid Thin Liquid: Within functional limits Presentation: Straw;Self Fed Nectar Thick Nectar Thick Liquid: Not tested Honey Thick Honey Thick Liquid: Not tested Puree Puree: Impaired Oral Phase Impairments: Reduced labial seal Oral Phase Functional Implications: Left anterior spillage Solid Solid: Impaired Presentation: Self Fed Oral Phase Impairments: Poor awareness of bolus;Impaired mastication Oral Phase Functional Implications: Impaired mastication;Left anterior spillage;Oral residue;Left lateral sulci pocketing BSE Assessment Risk for Aspiration Impact on safety and function: Mild aspiration risk Other Related Risk Factors: Previous CVA  Short Term Goals: Week 1: SLP Short Term Goal 1 (Week 1): Pt will consume current diet with minimal overt s/sx of aspiration with min A verbal cues to reduce rate of consumption, bite sizes, L pocketing/anterior spillage SLP Short Term Goal 2 (Week 1): Patient will implement speech intelligibility strategies at the sentence level with with min A verbal cues to monitor and self correct SLP Short Term Goal 3 (Week 1): Patient will utilize internal/external memory strategies to recall novel information with min A verbal cues SLP Short Term Goal 4 (Week 1): Patient will participate in further cognitive assessment  Refer to Care Plan for Long Term Goals  Recommendations for other services: None   Discharge Criteria: Patient will be discharged from SLP if patient refuses treatment 3 consecutive times without medical reason, if treatment goals not met, if there is a change in medical status, if patient makes no progress towards goals or if patient is discharged from hospital.  The above assessment, treatment plan, treatment alternatives and goals were discussed and mutually agreed upon: by patient  Patty Sermons 03/27/2022, 1:03 PM

## 2022-03-27 NOTE — Progress Notes (Signed)
Patient found to be eating boars head deli grinders that family had brought up from cafeteria. Educated that this was not within her ordered diet and that she was also a full supervision for meals. Patient stated that she was not going to eat what she was being served and that speech was aware and gave her recommendations on safe swallowing ie. clearing pocket and small bites. Family at bedside and is aware of safety concerns/full supervision in which they have been cleared for. Provider was also notified. Patient resting in bed at this time. Call bell within reach. Care ongoing.

## 2022-03-27 NOTE — Telephone Encounter (Signed)
Pharmacy Patient Advocate Encounter  Insurance verification completed.    The patient is insured through [employer]   The patient is currently admitted and ran test claims for the following: Eliquis.  Copays and coinsurance results were relayed to Inpatient clinical team.

## 2022-03-27 NOTE — Progress Notes (Signed)
[facility] Progress Note   Subjective:  Seen in room. Family at bedside. Denies cp, dyspnea. Some epigastric discomfort with drinking water   Objective Vitals:   03/26/22 1306 03/26/22 1952 03/27/22 0332 03/27/22 0344  BP: (!) 144/78 125/73 130/71   Pulse: 83 77 78   Resp: 15 18 18    Temp: 98.4 F (36.9 C) 97.7 F (36.5 C) 97.6 F (36.4 C)   TempSrc: Oral Oral Oral   SpO2: 92% 98% 99%   Weight:    102.2 kg   Physical Exam General: Alert female in NAD Heart: RRR, no murmurs, rubs or gallops Lungs: CTA bilaterally without wheezing, rhonchi or rales Abdomen: Soft, non-distended, +BS Extremities: No edema b/l lower extremities Dialysis Access:  LUE AVF + bruit  Additional Objective Labs: Basic Metabolic Panel: Recent Labs  Lab 03/21/22 1320 03/21/22 1322 03/21/22 1826 03/24/22 1019 03/27/22 0558  NA 139 138  --  136 136  K 5.1 4.8  --  4.0 4.1  CL 99 103  --  93* 95*  CO2 22  --   --  27 27  GLUCOSE 193* 187*  --  144* 138*  BUN 71* 62*  --  30* 35*  CREATININE 10.25* 11.30*  --  6.77* 7.93*  CALCIUM 10.0  --   --  9.9 9.4  PHOS  --   --  6.0* 5.3*  --     Liver Function Tests: Recent Labs  Lab 03/21/22 1320 03/24/22 1019 03/27/22 0558  AST 44*  --  16  ALT 18  --  15  ALKPHOS 151*  --  110  BILITOT 0.9  --  0.7  PROT 8.4*  --  6.6  ALBUMIN 3.7 3.2* 2.9*    No results for input(s): "LIPASE", "AMYLASE" in the last 168 hours. CBC: Recent Labs  Lab 03/21/22 1320 03/21/22 1322 03/24/22 1019 03/25/22 0222 03/27/22 0558  WBC 13.8*  --  8.9 8.7 7.5  NEUTROABS 11.8*  --   --   --  4.1  HGB 12.8   < > 12.0 11.7* 11.5*  HCT 39.6   < > 37.4 36.3 37.3  MCV 86.7  --  86.8 86.6 88.6  PLT 227  --  217 234 238   < > = values in this interval not displayed.    Blood Culture No results found for: "SDES", "SPECREQUEST", "CULT", "REPTSTATUS"  Cardiac Enzymes: No results for input(s): "CKTOTAL", "CKMB", "CKMBINDEX", "TROPONINI" in the last  168 hours. CBG: Recent Labs  Lab 03/24/22 1656 03/24/22 2055 03/24/22 2305 03/25/22 0344 03/25/22 1224  GLUCAP 141* 186* 140* 95 89    Iron Studies: No results for input(s): "IRON", "TIBC", "TRANSFERRIN", "FERRITIN" in the last 72 hours. @lablastinr3 @ Studies/Results: DG HIP UNILAT WITH PELVIS 2-3 VIEWS LEFT  Result Date: 03/26/2022 CLINICAL DATA:  Acute hip pain EXAM: DG HIP (WITH OR WITHOUT PELVIS) 2-3V LEFT COMPARISON:  None FINDINGS: No fracture or dislocation identified to explain the patient's pain. Mild degenerative changes in the left hip without loss of joint space. No other significant abnormalities. IMPRESSION: No cause for left hip pain identified. Electronically Signed   By: Dorise Bullion III M.D.   On: 03/26/2022 10:37   DG Shoulder 1V Left  Result Date: 03/26/2022 CLINICAL DATA:  [id number]; pain EXAM: LEFT SHOULDER COMPARISON:  None Available. FINDINGS: There is no evidence of fracture or dislocation. Mild-to-moderate degenerative/hypertrophic changes of the [facility] joint. No other focal bone abnormality. Soft tissues are unremarkable. IMPRESSION: No fracture  or dislocation. Mild-to-moderate degenerative changes of the [facility] joint. Electronically Signed   By: Frazier Richards M.D.   On: 03/26/2022 10:18   Medications:   amitriptyline  25 mg Oral QHS   apixaban  5 mg Oral BID   metoprolol succinate  200 mg Oral Daily   pravastatin  40 mg Oral q1800    Dialysis Orders: [place] [facility] ([facility] [phone])   4h  104.3kg  400/600  2/2.5 bath  Heparin 2000 LUA AVF - mircera 150 ug q2, last 8/5, last Hb 9.7 - calcitriol 0.5 ug tiw - sensipar 90 po tiw    Assessment/Plan: Acute CVA - sp intervention 8/8. Was here in June for similar event. Per stroke team. In CIR pAFib - Cardiology on board.  On Toprolol 200 Acute resp failure w/ hypoxemia -  Resolved, On RA this am. Reports SOB/chest pressure when drinking water but seems more consistent with swallowing issue than volume  overload. Consider speech eval Volume- didn't tolerate vol removal w/ HD. suspect CXR  may have been poor quality, vs neurogenic pulm edema not related to vol overload.  ESRD - on HD TTS. Will need new EDW at discharge HTN - BP was running high, soft today. Metoprolol resumed per cardiology Anemia of CKD - Hgb 11.7.  Esa last given 8/5, no esa needs.  7.   MBD ckd - CCa high, VDRA on hold. Typically takes sensipar, will resume. Phos is at goal.      Lynnda Child PA-C [facility] 03/27/2022,9:05 AM

## 2022-03-27 NOTE — Progress Notes (Signed)
Per off shift report patient cathed for 950cc of urine approximately 1830. Patient attempted to void this shift but unsuccessful. Patient cathed for 400cc of clear yellow urine at 0345. Patient denies any pain or burning with urination. Continue to monitor.

## 2022-03-27 NOTE — Progress Notes (Signed)
Patient reporting lower back and hip pain after activity-->started after her fall at home. Ordered K pad and low dose ultram prn.

## 2022-03-27 NOTE — TOC Benefit Eligibility Note (Signed)
Patient Teacher, English as a foreign language completed.    The patient is currently admitted and upon discharge could be taking Eliquis 5 mg.  The current 30 day co-pay is $50.00.   The patient is insured through [place], [place] Patient [place] Patient Advocate Team Direct Number: [phone]  Fax: [phone]

## 2022-03-27 NOTE — Progress Notes (Signed)
Physical Therapy Session Note  Patient Details  Name: Cindy Osborne MRN: [id number] Date of Birth: October 02, 1965  Today's Date: 03/27/2022 PT Individual Time: 1300-1415 PT Individual Time Calculation (min): 75 min   Short Term Goals: Week 1:  PT Short Term Goal 1 (Week 1): Patient will perform bed mobility with min A with our without use of bed rails. PT Short Term Goal 2 (Week 1): Patient will perform basic transfers with mod A >50% of the time. PT Short Term Goal 3 (Week 1): Patient will perform sit to stand with max A using LRAD. PT Short Term Goal 4 (Week 1): Patient will initiate w/c mobility.  Skilled Therapeutic Interventions/Progress Updates:     Patient in bed upon PT arrival. Patient alert and agreeable to PT session. Patient reported 4/10 L shoulder pain during session, RN made aware. PT provided repositioning, rest breaks, and distraction as pain interventions throughout session. Patient reported significant itching of her back throughout session, provided soft scratching with washcloth for relief, RN made aware and reports Benadryl was given prior to session. Patient reported feeling "drowsy" from the Benadryl with difficulty maintaining her eyes open intermittently during session, RN aware.   Therapeutic Activity: Bed Mobility: Patient performed supine to sit with min-mod A with use of hospital bed features. Provided verbal cues for sequencing and progressing through log rolling to the R for improved trunk control. Donned B prosthetics with total A sitting EOB with supervision for trunk control in sitting, with x1 posterior LOB requiring mod A to correct.  Transfers: Patient performed a downhill slide board transfer to the R from bed>w/c with mod A and total A for board placement. Provided cues for hand placement, board placement, and head-hips relationship for proper technique and decreased assist with transfers. Patient attempted sit to/from stand x2 in the // bars without  success, limited by strength and fear of falling. Performed sit to/from stand in [place] 2x30-60 sec with dependent assist with notable R quad and gluteal activation with max multimodal cuing.   Patient required increased time for initiation, cuing, rest breaks, management of apprehension and fear of falling, and for completion of tasks throughout session. Utilized therapeutic use of self throughout to promote efficiency.   Patient in w/c in the room at end of session with breaks locked, seat belt alarm set, and all needs within reach.   Therapy Documentation Precautions:  Precautions Precautions: Fall, Other (comment) Precaution Comments: L hemiplegia, Bil BKA 2 years ago and 1.5 years ago Required Braces or Orthoses: Other Brace Other Brace: bil prosthetic Restrictions Weight Bearing Restrictions: No   Therapy/Group: Individual Therapy  Daxtin Leiker L Derryl Uher PT, DPT, NCS, CBIS  03/27/2022, 4:11 PM

## 2022-03-28 DIAGNOSIS — I63511 Cerebral infarction due to unspecified occlusion or stenosis of right middle cerebral artery: Secondary | ICD-10-CM | POA: Diagnosis not present

## 2022-03-28 LAB — RENAL FUNCTION PANEL
Albumin: 2.8 g/dL — ABNORMAL LOW (ref 3.5–5.0)
Anion gap: 15 (ref 5–15)
BUN: 52 mg/dL — ABNORMAL HIGH (ref 6–20)
CO2: 24 mmol/L (ref 22–32)
Calcium: 9.4 mg/dL (ref 8.9–10.3)
Chloride: 94 mmol/L — ABNORMAL LOW (ref 98–111)
Creatinine, Ser: 9.7 mg/dL — ABNORMAL HIGH (ref 0.44–1.00)
GFR, Estimated: 4 mL/min — ABNORMAL LOW (ref >60)
Glucose, Bld: 162 mg/dL — ABNORMAL HIGH (ref 70–99)
Phosphorus: 7.7 mg/dL — ABNORMAL HIGH (ref 2.5–4.6)
Potassium: 4.2 mmol/L (ref 3.5–5.1)
Sodium: 133 mmol/L — ABNORMAL LOW (ref 135–145)

## 2022-03-28 LAB — CBC
HCT: 36.1 % (ref 36.0–46.0)
Hemoglobin: 11.5 g/dL — ABNORMAL LOW (ref 12.0–15.0)
MCH: 27.9 pg (ref 26.0–34.0)
MCHC: 31.9 g/dL (ref 30.0–36.0)
MCV: 87.6 fL (ref 80.0–100.0)
Platelets: 253 10*3/uL (ref 150–400)
RBC: 4.12 MIL/uL (ref 3.87–5.11)
RDW: 17.7 % — ABNORMAL HIGH (ref 11.5–15.5)
WBC: 8.1 10*3/uL (ref 4.0–10.5)
nRBC: 0 % (ref 0.0–0.2)

## 2022-03-28 LAB — GLUCOSE, CAPILLARY
Glucose-Capillary: 124 mg/dL — ABNORMAL HIGH (ref 70–99)
Glucose-Capillary: 127 mg/dL — ABNORMAL HIGH (ref 70–99)
Glucose-Capillary: 131 mg/dL — ABNORMAL HIGH (ref 70–99)

## 2022-03-28 MED ORDER — LIDOCAINE-PRILOCAINE 2.5-2.5 % EX CREA: 1.0000 | TOPICAL_CREAM | CUTANEOUS | Status: DC | PRN

## 2022-03-28 MED ORDER — HEPARIN SODIUM (PORCINE) 1000 UNIT/ML DIALYSIS
20.0000 [IU]/kg | INTRAMUSCULAR | Status: DC | PRN
  Administered 2022-03-28: 2000 [IU] via INTRAVENOUS_CENTRAL
  Filled 2022-03-28 (×2): qty 2

## 2022-03-28 MED ORDER — PENTAFLUOROPROP-TETRAFLUOROETH EX AERO: 1.0000 | INHALATION_SPRAY | CUTANEOUS | Status: DC | PRN

## 2022-03-28 MED ORDER — LIDOCAINE HCL (PF) 1 % IJ SOLN: 5.0000 mL | INTRAMUSCULAR | Status: DC | PRN

## 2022-03-28 NOTE — Progress Notes (Signed)
Physical Therapy Session Note  Patient Details  Name: Cindy Osborne MRN: [id number] Date of Birth: [birth year]  Today's Date: 03/28/2022 PT Individual Time: 0800-0915 PT Individual Time Calculation (min): 75 min   Short Term Goals: Week 1:  PT Short Term Goal 1 (Week 1): Patient will perform bed mobility with min A with our without use of bed rails. PT Short Term Goal 2 (Week 1): Patient will perform basic transfers with mod A >50% of the time. PT Short Term Goal 3 (Week 1): Patient will perform sit to stand with max A using LRAD. PT Short Term Goal 4 (Week 1): Patient will initiate w/c mobility.  Skilled Therapeutic Interventions/Progress Updates:     Patient in bed upon PT arrival. Patient alert and agreeable to PT session. Patient reported 3/10 L shoulder pain during session, RN made aware. PT provided repositioning, rest breaks, and distraction as pain interventions throughout session.   Therapeutic Activity: Bed Mobility: Patient performed supine to sit with min A for trunk support from a flat bed without use of bed rails. Provided verbal cues for initiation. Donned B prosthetics with total A sitting EOB with supervision for trunk control in sitting. MD rounded and patient maintained sitting balance throughout. Discussed ordering the patient a new stockingnett for her R residual limb, as her's is missing.  Transfers: Patient performed a dependent toilet transfer using the [place] with +2 assist for safety and equipment management to the [facility]. Patient was unsuccessful with BM on BSC, reports urge to have a BM, but unable to at this time, LPN made aware. Performed peri-care and lower body clothing management with total A +2 during toileing. Provided cues for patient to initiate R quad and gluteal activation and R trunk extension and lateral elongation in standing during transfers  Neuromuscular Re-ed: Patient performed the following functional L hemi-body motor control activities: -sit  to stand x3 using R rail progressing from max A +2 to min-mod A +2 for boosting up and forward weight shift over prosthetics -performed standing balance 3x1-2 min focused on R quad and gluteal activation and R trunk extension and lateral elongation with multimodal cues for midline orientation due to L lean in standing  Patient in w/c in the room at end of session with breaks locked, seat belt alarm set, and all needs within reach.   Therapy Documentation Precautions:  Precautions Precautions: Fall, Other (comment) Precaution Comments: L hemiplegia, Bil BKA 2 years ago and 1.5 years ago Required Braces or Orthoses: Other Brace Other Brace: bil prosthetic Restrictions Weight Bearing Restrictions: No    Therapy/Group: Individual Therapy  Aminat Shelburne L Aceton Kinnear PT, DPT, NCS, CBIS  03/28/2022, 5:25 PM

## 2022-03-28 NOTE — Plan of Care (Signed)
  Problem: RH Swallowing Goal: LTG Patient will consume least restrictive diet using compensatory strategies with assistance (SLP) Description: LTG:  Patient will consume least restrictive diet using compensatory strategies with assistance (SLP) Flowsheets (Taken 03/28/2022 0920) LTG: Pt Patient will consume least restrictive diet using compensatory strategies with assistance of (SLP): Supervision Goal: LTG Pt will demonstrate functional change in swallow as evidenced by bedside/clinical objective assessment (SLP) Description: LTG: Patient will demonstrate functional change in swallow as evidenced by bedside/clinical objective assessment (SLP) Flowsheets (Taken 03/28/2022 0920) LTG: Patient will demonstrate functional change in swallow as evidenced by bedside/clinical objective assessment: Oropharyngeal swallow   Problem: RH Expression Communication Goal: LTG Patient will increase speech intelligibility (SLP) Description: LTG: Patient will increase speech intelligibility at word/phrase/conversation level with cues, % of the time (SLP) Flowsheets (Taken 03/28/2022 0920) LTG: Patient will increase speech intelligibility (SLP): Supervision Level: Conversation level   Problem: RH Memory Goal: LTG Patient will use memory compensatory aids to (SLP) Description: LTG:  Patient will use memory compensatory aids to recall biographical/new, daily complex information with cues (SLP) Flowsheets (Taken 03/28/2022 0920) LTG: Patient will use memory compensatory aids to (SLP): Supervision

## 2022-03-28 NOTE — Progress Notes (Signed)
Occupational Therapy Session Note  Patient Details  Name: Cindy Osborne MRN: [id number] Date of Birth: 1966-01-18  Today's Date: 03/28/2022 OT Individual Time: 0952-1100 OT Individual Time Calculation (min): 68 min    Short Term Goals: Week 1:  OT Short Term Goal 1 (Week 1): Patient willl perform toilet transfer with max A of 1 OT Short Term Goal 2 (Week 1): Patient demonstrated impoved awareness of L UE by positioning prior to transfer with min verbal cues. OT Short Term Goal 3 (Week 1): Patient will complete maintain sitting balance at EOB with close supervision  Skilled Therapeutic Interventions/Progress Updates:  Pt awake seated in the w/c and on the phone upon OT arrival to the room. Pt reports, "Who is you? Are you with PT?" Pt in agreement for OT session. After transferring to the mat and back in the w/c, pt demo's increased fatigue and requires moderate prompts to keep eyes open. Pt reports taking a medication earlier in the morning that makes pt feel drowsy.   Therapy Documentation Precautions:  Precautions Precautions: Fall, Other (comment) Precaution Comments: L hemiplegia, Bil BKA 2 years ago and 1.5 years ago Required Braces or Orthoses: Other Brace Other Brace: bil prosthetic Restrictions Weight Bearing Restrictions: No Vital Signs: Please see "Flowsheet" for most recent vitals charted by nursing staff.  Pain: Pain Assessment Pain Scale: 0-10 Pain Score: 0-No pain  ADL: Pt declines need to perform ADL's at this time.   Transfer Training: Pt participates in transfer training with slide board transfers in order to improve independence with functional transfers. Education provided to pt on slideboard transfer techniques and proper positioning of w/c in relation to the transfer surface. Educated pt on proper placement of the slide board, importance of contralateral leaning along with anterior weight shifting, and using RUE to push with transfer. Pt able to complete  a slide board transfer to the L from w/c <> edge of mat with 2-person assistance for safety. Pt demo's good recall of transfer techniques throughout transfer.   LUE Neuro Re-Education: While at edge of mat, pt participates in LUE neuro re-education task in order to improve motor control skills which are needed for improved independence with ADL's. Pt encouraged to perform WB through L elbow, however, pt unable to maintain WB positioning as pt would transition back to sitting after ~15-20 seconds on each trial. Pt reports increased discomfort in L hip with prolonged positioning for WB through L elbow. Pt then encouraged to perform tricep push-ups and transition between WB through L elbow <> sitting for improve strengthening and neuro re-education of LUE as well as improve core control. Pt able to perform 10 reps x 2 of WB through L elbow <> sitting with rest breaks halfway through each trial as well as rest breaks between trials. Pt noted to have minimal contractions through L tricep and posterior forearm in attempts to perform tricep push-up. However, pt demo's strong use of R core muscles for righting trunk from WB position <> sitting with CGA for safety while at edge of mat. Pt able to maintain sitting at edge of mat with close supervision - CGA for safety with sitting balance.   L-Sided Attention & Awareness: Pt noted to have a R side preference, however, pt is able to visually attend in L visual field when given prompts. Pt participates in a midline crossing and L visual attention task using pegs to place from R side > place in pegboard on the R side using RUE. Pt able to grasp  pegs, cross midline, and place in pegboard placed on L side with pt only placing pegs on R side of pegboard as this was closest to pt's midline. Pt noted to attend well to L side when looking to place pegs in pegboard. However, pt noted to continue to demo drowsiness and close eyes throughout session.   LUE Positioning: OT  obtained an arm tray for optimal support and positioning of LUE while seated in the w/c in order to maintain LUE safety, L shoulder joint integrity, and awareness to LUE. OT placed this tray and positioned LUE. Pt reports comfort with positioning of LUE on the arm tray.   Pt requested to stay in the w/c at end of session. Pt left sitting comfortably in the w/c with personal belongings and call light within reach, belt alarm placed and activated, and comfort needs attended to.   Therapy/Group: Individual Therapy  Barbee Shropshire 03/28/2022, 12:22 PM

## 2022-03-28 NOTE — IPOC Note (Signed)
Overall Plan of Care [facility]) Patient Details Name: Cindy Osborne MRN: [id number] DOB: 09/29/1965  Admitting Diagnosis: Acute ischemic right MCA stroke [facility])  Hospital Problems: Principal Problem:   Acute ischemic right MCA stroke [facility])     Functional Problem List: Nursing Bowel, Edema, Medication Management, Endurance, Motor, Nutrition, Safety, Skin Integrity  PT Balance, Behavior, Edema, Endurance, Motor, Nutrition, Pain, Perception, Safety, Sensory, Skin Integrity  OT Balance, Cognition, Endurance, Motor, Pain, Perception, Safety, Vision, Skin Integrity  SLP Behavior, Cognition, Safety, Motor  TR         Basic ADL's: OT Eating, Grooming, Bathing, Toileting, Dressing     Advanced  ADL's: OT       Transfers: PT Bed Mobility, Bed to Chair, Car, Manufacturing systems engineer, Metallurgist: PT Ambulation, Emergency planning/management officer, Stairs     Additional Impairments: OT Fuctional Use of Upper Extremity  SLP Communication, Swallowing, Social Cognition expression Memory, Awareness  TR      Anticipated Outcomes Item Anticipated Outcome  Self Feeding set-up A  Swallowing  sup A   Basic self-care  CGA  Toileting  CGA   Bathroom Transfers CGA  Bowel/Bladder  moderate assistance  Transfers  min A using LRAD  Locomotion  TBD if patient will be ambulatory at d/c  Communication  mod I-to-sup A  Cognition  sup A  Pain  less than 2  Safety/Judgment  moderate assistance   Therapy Plan: PT Intensity: Minimum of 1-2 x/day ,45 to 90 minutes PT Frequency: 5 out of 7 days PT Duration Estimated Length of Stay: 21-24 days OT Intensity: Minimum of 1-2 x/day, 45 to 90 minutes OT Frequency: 5 out of 7 days OT Duration/Estimated Length of Stay: 21-24 days SLP Intensity: Minumum of 1-2 x/day, 30 to 90 minutes SLP Frequency: 3 to 5 out of 7 days SLP Duration/Estimated Length of Stay: 21-24 days   Team Interventions: Nursing Interventions Patient/Family Education,  Dysphagia/Aspiration Precaution Training, Psychosocial Support, Disease Management/Prevention, Medication Management, Discharge Planning  PT interventions Ambulation/gait training, Cognitive remediation/compensation, Discharge planning, DME/adaptive equipment instruction, Functional mobility training, Pain management, Psychosocial support, Splinting/orthotics, Therapeutic Activities, UE/LE Strength taining/ROM, Visual/perceptual remediation/compensation, UE/LE Coordination activities, Wheelchair propulsion/positioning, Therapeutic Exercise, Stair training, Skin care/wound management, Patient/family education, Neuromuscular re-education, Functional electrical stimulation, Disease management/prevention, Academic librarian, Training and development officer  OT Interventions Training and development officer, Cognitive remediation/compensation, Academic librarian, Discharge planning, Disease mangement/prevention, Engineer, drilling, Functional electrical stimulation, Functional mobility training, Neuromuscular re-education, Pain management, Patient/family education, Psychosocial support, Self Care/advanced ADL retraining, Skin care/wound managment, Splinting/orthotics, Therapeutic Activities, Therapeutic Exercise, UE/LE Strength taining/ROM, UE/LE Coordination activities, Visual/perceptual remediation/compensation, Wheelchair propulsion/positioning  SLP Interventions Cognitive remediation/compensation, Speech/Language facilitation, Dysphagia/aspiration precaution training, Functional tasks, Patient/family education, Therapeutic Activities, Internal/external aids  TR Interventions    SW/CM Interventions Discharge Planning, Psychosocial Support, Disease Management/Prevention, Patient/Family Education   Barriers to Discharge MD  Medical stability, Hemodialysis, and hx bilateral BKA  Nursing Home environment access/layout    PT Hemodialysis, Behavior    OT Other (comments) Bilateral amputee,  lives alone, but should have family support  SLP      SW Decreased caregiver support, Lack of/limited family support, Hemodialysis     Team Discharge Planning: Destination: PT-Home ,OT- Home , SLP-Home Projected Follow-up: PT-Home health PT, OT-  Home health OT, SLP-Outpatient SLP, Home Health SLP, 24 hour supervision/assistance Projected Equipment Needs: PT-To be determined, OT- To be determined, SLP-None recommended by SLP Equipment Details: PT- , OT-  Patient/family involved in discharge planning: PT- Patient,  OT-Patient, Family member/caregiver,  SLP-Patient, Family member/caregiver  MD ELOS: 14-20d Medical Rehab Prognosis:  Good Assessment: The patient has been admitted for CIR therapies with the diagnosis of Right CVA. The team will be addressing functional mobility, strength, stamina, balance, safety, adaptive techniques and equipment, self-care, bowel and bladder mgt, patient and caregiver education, optimize B BK prosthetic fit . Goals have been set at [facility]. Anticipated discharge destination is Home with family support.        See Team Conference Notes for weekly updates to the plan of care

## 2022-03-28 NOTE — Progress Notes (Signed)
[facility] Progress Note   Subjective:  Patient seen and examined in room. No acute events/complaints currently. HD today   Objective Vitals:   03/27/22 1451 03/27/22 2013 03/28/22 0500 03/28/22 0526  BP: (!) 145/79 117/60  129/67  Pulse: 75 77  75  Resp: 15 18  18   Temp: 98.5 F (36.9 C) 98.2 F (36.8 C)  98.4 F (36.9 C)  TempSrc: Oral Oral  Oral  SpO2: 94% 96%  99%  Weight:   101.9 kg    Physical Exam General: NAD Heart: RRR, no murmurs, rubs or gallops Lungs: CTA bilaterally without wheezing, rhonchi or rales Abdomen: Soft, non-distended, +BS Extremities: No edema b/l lower extremities Neuro: awake, alert Dialysis Access:  LUE AVF + bruit  Additional Objective Labs: Basic Metabolic Panel: Recent Labs  Lab 03/21/22 1320 03/21/22 1322 03/21/22 1826 03/24/22 1019 03/27/22 0558  NA 139 138  --  136 136  K 5.1 4.8  --  4.0 4.1  CL 99 103  --  93* 95*  CO2 22  --   --  27 27  GLUCOSE 193* 187*  --  144* 138*  BUN 71* 62*  --  30* 35*  CREATININE 10.25* 11.30*  --  6.77* 7.93*  CALCIUM 10.0  --   --  9.9 9.4  PHOS  --   --  6.0* 5.3*  --    Liver Function Tests: Recent Labs  Lab 03/21/22 1320 03/24/22 1019 03/27/22 0558  AST 44*  --  16  ALT 18  --  15  ALKPHOS 151*  --  110  BILITOT 0.9  --  0.7  PROT 8.4*  --  6.6  ALBUMIN 3.7 3.2* 2.9*   No results for input(s): "LIPASE", "AMYLASE" in the last 168 hours. CBC: Recent Labs  Lab 03/21/22 1320 03/21/22 1322 03/24/22 1019 03/25/22 0222 03/27/22 0558  WBC 13.8*  --  8.9 8.7 7.5  NEUTROABS 11.8*  --   --   --  4.1  HGB 12.8   < > 12.0 11.7* 11.5*  HCT 39.6   < > 37.4 36.3 37.3  MCV 86.7  --  86.8 86.6 88.6  PLT 227  --  217 234 238   < > = values in this interval not displayed.   Blood Culture No results found for: "SDES", "SPECREQUEST", "CULT", "REPTSTATUS"  Cardiac Enzymes: No results for input(s): "CKTOTAL", "CKMB", "CKMBINDEX", "TROPONINI" in the last 168  hours. CBG: Recent Labs  Lab 03/25/22 0344 03/25/22 1224 03/27/22 1656 03/27/22 2031 03/28/22 0614  GLUCAP 95 89 141* 246* 124*   Iron Studies: No results for input(s): "IRON", "TIBC", "TRANSFERRIN", "FERRITIN" in the last 72 hours. @lablastinr3 @ Studies/Results: DG HIP UNILAT WITH PELVIS 2-3 VIEWS LEFT  Result Date: 03/26/2022 CLINICAL DATA:  Acute hip pain EXAM: DG HIP (WITH OR WITHOUT PELVIS) 2-3V LEFT COMPARISON:  None FINDINGS: No fracture or dislocation identified to explain the patient's pain. Mild degenerative changes in the left hip without loss of joint space. No other significant abnormalities. IMPRESSION: No cause for left hip pain identified. Electronically Signed   By: Dorise Bullion III M.D.   On: 03/26/2022 10:37   DG Shoulder 1V Left  Result Date: 03/26/2022 CLINICAL DATA:  [id number]; pain EXAM: LEFT SHOULDER COMPARISON:  None Available. FINDINGS: There is no evidence of fracture or dislocation. Mild-to-moderate degenerative/hypertrophic changes of the [facility] joint. No other focal bone abnormality. Soft tissues are unremarkable. IMPRESSION: No fracture or dislocation. Mild-to-moderate degenerative changes of the  AC joint. Electronically Signed   By: Frazier Richards M.D.   On: 03/26/2022 10:18   Medications:   amitriptyline  25 mg Oral QHS   apixaban  5 mg Oral BID   Chlorhexidine Gluconate Cloth  6 each Topical Q0600   insulin aspart  0-5 Units Subcutaneous QHS   insulin aspart  0-6 Units Subcutaneous TID WC   metoprolol succinate  200 mg Oral Daily   pravastatin  40 mg Oral q1800    Dialysis Orders: [facility] ([facility] [phone])   4h  104.3kg  400/600  2/2.5 bath  Heparin 2000 LUA AVF - mircera 150 ug q2, last 8/5, last Hb 9.7 - calcitriol 0.5 ug tiw - sensipar 90 po tiw    Assessment/Plan: Acute CVA - sp intervention 8/8. Was here in June for similar event. Per stroke team. In CIR pAFib - Cardiology on board.  On Toprol 200mg  daily Acute resp failure w/  hypoxemia -  Resolved, On RA this am. Reports SOB/chest pressure when drinking water but seems more consistent with swallowing issue than volume overload. Consider speech eval Volume- didn't tolerate vol removal w/ HD. suspect CXR  may have been poor quality, vs neurogenic pulm edema not related to vol overload.  ESRD - on HD TTS. Will need new EDW at discharge HTN - BP acceptable. Metoprolol resumed per cardiology Anemia of CKD - Hgb 11.5 8/14.  Esa last given 8/5, no esa needs.  7.   MBD ckd - CCa high, VDRA on hold. Typically takes sensipar, will resume. Phos is at goal.      Gean Quint, MD [facility]

## 2022-03-28 NOTE — Progress Notes (Signed)
Orthopedic Tech Progress Note Patient Details:  Cindy Osborne [birth year] [id number]  Called in order to [employer] for a RETENTION SOCK for a BKA   Patient ID: Cindy Osborne, female   DOB: [birth year], 56 y.o.   MRN: [id number]  Cindy Osborne 03/28/2022, 8:43 AM

## 2022-03-28 NOTE — Progress Notes (Signed)
Speech Language Pathology Daily Session Note  Patient Details  Name: Cindy Osborne MRN: [id number] Date of Birth: 24-Sep-1965  Today's Date: 03/28/2022 SLP Individual Time: 1100-1200 SLP Individual Time Calculation (min): 60 min  Short Term Goals: Week 1: SLP Short Term Goal 1 (Week 1): Pt will consume current diet with minimal overt s/sx of aspiration with min A verbal cues to reduce rate of consumption, bite sizes, L pocketing/anterior spillage SLP Short Term Goal 2 (Week 1): Patient will implement speech intelligibility strategies at the sentence level with with min A verbal cues to monitor and self correct SLP Short Term Goal 3 (Week 1): Patient will utilize internal/external memory strategies to recall novel information with min A verbal cues SLP Short Term Goal 4 (Week 1): Patient will participate in further cognitive assessment  Skilled Therapeutic Interventions: Skilled ST treatment focused on cognitive goals. Pt received upright in wheelchair on arrival. Pt utilized phone with min A verbal cues to answer call/swipe up, and sup A to make outgoing call to sister. SLP administered the Cognistat to evaluate pt's cognitive-linguistic skills. Pt scored WFL with orientation, attention, calculations, and judgement; mild impairment in memory registration and recall; severe impairment with constructional ability. Per further informal assessment, pt presented with decreased organization, problem solving skills, error awareness, and left visual inattention. SLP provided educated on speech intelligibility strategies using "Be A Boss acronym." SLP placed visual in pt's room as external aid for carry over. Pt exhibited increased sleepiness by end of session and required intermittent verbal cues to maintain arousal. Patient was left in wheelchair with alarm activated and immediate needs within reach at end of session. Continue per current plan of care.      Pain Pain Assessment Pain Scale: 0-10 Pain  Score: 0-No pain  Therapy/Group: Individual Therapy  Jessina Marse T Lylla Eifler 03/28/2022, 11:13 AM

## 2022-03-28 NOTE — Progress Notes (Signed)
Patient ID: Cindy Osborne, female   DOB: 1965-08-23, 56 y.o.   MRN: [id number] Met with the patient to review current situation, rehab process, team conference and plan of care. Discussed secondary risks including HTN, HLD, DM,OSA,, ESRD/HD and previous stroke. Drove self to HD PTA; sister will provide transportation now. Reviewed medications, fluid restrictions, skin care. Familiar with insulin administration; on insulin/meal coverage PTA. Also noted monitoring BP, CBGs and daily weights. Concerned about a "pimple" on her shoulder/back area; MD aware. No other concerns at present. Continue to follow along to discharge to address educational needs to facilitate preparation for discharge. Margarito Liner

## 2022-03-28 NOTE — Progress Notes (Signed)
PROGRESS NOTE   Subjective/Complaints:  DIscussed diet and insulin use at home No pain c/os Working with PT, is missing stockinette for Left BK stump   ROS: Patient denies CP, SOB, N/V/D   Objective:   DG HIP UNILAT WITH PELVIS 2-3 VIEWS LEFT  Result Date: 03/26/2022 CLINICAL DATA:  Acute hip pain EXAM: DG HIP (WITH OR WITHOUT PELVIS) 2-3V LEFT COMPARISON:  None FINDINGS: No fracture or dislocation identified to explain the patient's pain. Mild degenerative changes in the left hip without loss of joint space. No other significant abnormalities. IMPRESSION: No cause for left hip pain identified. Electronically Signed   By: Dorise Bullion III M.D.   On: 03/26/2022 10:37   DG Shoulder 1V Left  Result Date: 03/26/2022 CLINICAL DATA:  [id number]; pain EXAM: LEFT SHOULDER COMPARISON:  None Available. FINDINGS: There is no evidence of fracture or dislocation. Mild-to-moderate degenerative/hypertrophic changes of the [facility] joint. No other focal bone abnormality. Soft tissues are unremarkable. IMPRESSION: No fracture or dislocation. Mild-to-moderate degenerative changes of the [facility] joint. Electronically Signed   By: Frazier Richards M.D.   On: 03/26/2022 10:18   Recent Labs    03/27/22 0558  WBC 7.5  HGB 11.5*  HCT 37.3  PLT 238    Recent Labs    03/27/22 0558  NA 136  K 4.1  CL 95*  CO2 27  GLUCOSE 138*  BUN 35*  CREATININE 7.93*  CALCIUM 9.4     Intake/Output Summary (Last 24 hours) at 03/28/2022 0818 Last data filed at 03/27/2022 1851 Gross per 24 hour  Intake 438 ml  Output 350 ml  Net 88 ml         Physical Exam: Vital Signs Blood pressure 129/67, pulse 75, temperature 98.4 F (36.9 C), temperature source Oral, resp. rate 18, weight 101.9 kg, SpO2 99 %.   Constitutional: No distress . Vital signs reviewed. HEENT: NCAT, EOMI, oral membranes moist Neck: supple Cardiovascular: RRR without murmur. No JVD     Respiratory/Chest: CTA Bilaterally without wheezes or rales. Normal effort    GI/Abdomen: BS +, non-tender, non-distended Ext: no clubbing, cyanosis, or edema Psych: pleasant and cooperative  Skin: No evidence of breakdown, no evidence of rash Neurologic: Cranial nerves II through XII intact, motor strength is 5/5 in right deltoid, bicep, tricep, grip, hip flexor, knee extensors, 1-2/5 left delt, biceps, 2-triceps and finger flexors, 1/5 hip flexors and 3 knee flexors and extensors Sensory exam normal sensation to light touch and proprioception in right upper and lower extremities, absent LT sensation LUE, intact LLE Cerebellar exam normal finger to nose to finger as well as heel to shin in bilateral upper and lower extremities Musculoskeletal: no pain with left hip ROM, mild pain with Left shoulder ROM, mild tenderness over Left AC jt area, bilateral BKA well healed. Both bk stumps well shaped. Full PROM.    Assessment/Plan: 1. Functional deficits which require 3+ hours per day of interdisciplinary therapy in a comprehensive inpatient rehab setting. Physiatrist is providing close team supervision and 24 hour management of active medical problems listed below. Physiatrist and rehab team continue to assess barriers to discharge/monitor patient progress toward functional and medical goals  Care Tool:  Bathing    Body parts bathed by patient: Left arm, Chest, Abdomen, Right upper leg, Face   Body parts bathed by helper: Front perineal area, Buttocks, Right arm Body parts n/a: Left lower leg, Right lower leg   Bathing assist Assist Level: Maximal Assistance - Patient 24 - 49%     Upper Body Dressing/Undressing Upper body dressing   What is the patient wearing?: Pull over shirt    Upper body assist Assist Level: Moderate Assistance - Patient 50 - 74%    Lower Body Dressing/Undressing Lower body dressing      What is the patient wearing?: Pants     Lower body assist Assist for  lower body dressing: Maximal Assistance - Patient 25 - 49%     Toileting Toileting    Toileting assist Assist for toileting: Dependent - Patient 0%     Transfers Chair/bed transfer  Transfers assist  Chair/bed transfer activity did not occur: Safety/medical concerns  Chair/bed transfer assist level: 2 Helpers     Locomotion Ambulation   Ambulation assist   Ambulation activity did not occur: Safety/medical concerns          Walk 10 feet activity   Assist  Walk 10 feet activity did not occur: Safety/medical concerns        Walk 50 feet activity   Assist Walk 50 feet with 2 turns activity did not occur: Safety/medical concerns         Walk 150 feet activity   Assist Walk 150 feet activity did not occur: Safety/medical concerns         Walk 10 feet on uneven surface  activity   Assist Walk 10 feet on uneven surfaces activity did not occur: Safety/medical concerns         Wheelchair     Assist Is the patient using a wheelchair?: Yes Type of Wheelchair: Manual    Wheelchair assist level: Dependent - Patient 0% Max wheelchair distance: >150 feet    Wheelchair 50 feet with 2 turns activity    Assist        Assist Level: Dependent - Patient 0%   Wheelchair 150 feet activity     Assist      Assist Level: Dependent - Patient 0%   Blood pressure 129/67, pulse 75, temperature 98.4 F (36.9 C), temperature source Oral, resp. rate 18, weight 101.9 kg, SpO2 99 %.  Medical Problem List and Plan: 1. Functional deficits secondary to RIght MCA cardioembolic infarct complicated by prior hx B BKA             -patient may  shower             -ELOS/Goals: MinA mobility and ADL, use R BKA prothetic to transfer to [facility] , sliding board 2.  Antithrombotics: -DVT/anticoagulation:  Pharmaceutical: Eliquis             -antiplatelet therapy: N/A 3. Pain Management: Tylenol prn.  4. Mood/Behavior/Sleep: LCSW to follow for evaluation and  support             -continue Elavil at bedtime for insomnia.              -antipsychotic agents: N/A 5. Neuropsych/cognition: This patient is capable of making decisions on her own behalf. 6. Skin/Wound Care: Routine pressure relief measures.  7. Fluids/Electrolytes/Nutrition: Strict I/O. Add ed1200 cc FR/day.             --daily weights.  8. New onset A fib w/RVR:  Monitor HR TID continue Eliquis. 9. T2DM: Hgb A1C- 6.0 and well controlled. Was on insulin Glargine 18 units with 5 units TID meal coverage.  CBG (last 3)  Recent Labs    03/27/22 1656 03/27/22 2031 03/28/22 0614  GLUCAP 141* 246* 124*    Controlled 8/15, some elevation last noc likely due to dietary non compliance , may need to resume if intake increases  10. ESRD: HD TTS at the end of the day to help with tolerance of therapy during the day.  Appreciate Nephro assistance  11. Small thyroid nodule: Follow up with PCP after discharge for further work up.  12.  Patchy/nodular chest densities: CT chest recommended for follow-up 13. OSA: Continue BIPAP when asleep.   13.  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  Fall PTA with Left hip and shoulder pain check xrays although exam appears mainly consistent with post CVA shouolder pain as well as Left hip troch bursitis  8/14-mild to moderate OA visible on xrays of left shoulder and pelvis    -add voltaren gel to shoulder and hip pain   LOS: 3 days A FACE TO FACE EVALUATION WAS PERFORMED  Charlett Blake 03/28/2022, 8:18 AM

## 2022-03-29 DIAGNOSIS — I63511 Cerebral infarction due to unspecified occlusion or stenosis of right middle cerebral artery: Secondary | ICD-10-CM | POA: Diagnosis not present

## 2022-03-29 LAB — GLUCOSE, CAPILLARY
Glucose-Capillary: 161 mg/dL — ABNORMAL HIGH (ref 70–99)
Glucose-Capillary: 152 mg/dL — ABNORMAL HIGH (ref 70–99)
Glucose-Capillary: 175 mg/dL — ABNORMAL HIGH (ref 70–99)
Glucose-Capillary: 189 mg/dL — ABNORMAL HIGH (ref 70–99)

## 2022-03-29 NOTE — Progress Notes (Signed)
Speech Language Pathology Daily Session Note  Patient Details  Name: Cindy Osborne MRN: [id number] Date of Birth: [birth year]  Today's Date: 03/29/2022 SLP Individual Time: 0802-0900 SLP Individual Time Calculation (min): 58 min  Short Term Goals: Week 1: SLP Short Term Goal 1 (Week 1): Pt will consume current diet with minimal overt s/sx of aspiration with min A verbal cues to reduce rate of consumption, bite sizes, L pocketing/anterior spillage SLP Short Term Goal 2 (Week 1): Patient will implement speech intelligibility strategies at the sentence level with with min A verbal cues to monitor and self correct SLP Short Term Goal 3 (Week 1): Patient will utilize internal/external memory strategies to recall novel information with min A verbal cues SLP Short Term Goal 4 (Week 1): Patient will participate in further cognitive assessment SLP Short Term Goal 4 - Progress (Week 1): Met  Skilled Therapeutic Interventions: Skilled ST treatment focused on cognitive goals. Pt was received semi reclined in bed on arrival. Pt was alert and oriented x4 with sup A verbal cues.   Therapist wrote pt's therapy schedule for the day. Pt verbally read with 75% accuracy and min-to-mod A verbal cues to scan left; mod A verbal cues needed to re-locate information as session progressed.  Pt required max A for error awareness and min A for basic problem solving related to time calculation (e.g., "if it is 0845 now, how much time is it until your next session at 0915").   SLP and pt completed medication management task to increase awareness of current medication regime. Pt demonstrated awareness of 50% of medications by name and purpose prior to education. Following education, pt recalled 80% of medications with min A verbal cues. Pt comprehended medication labels/instructions with 75% accuracy given min A verbal cues, and responded to hypothetical scenarios pertaining to medication management and prescription  instructions with mod-to-max A verbal cues. SLP initiated verbal reasoning, error awareness, and visual scanning task by identifying organization errors in daily BID pillbox. Pt required max A for error awareness, min A for visual scanning, and sup A verbal cues for repair. Pt demonstrated drowsiness toward end of session which may have impacted accuracy with task. Will cont during future session(s).   Patient was left in bed with alarm activated and immediate needs within reach at end of session. Continue per current plan of care.       Pain Pain Assessment Pain Scale: 0-10 Pain Score: 0-No pain  Therapy/Group: Individual Therapy  Raya Mckinstry T Dmarcus Decicco 03/29/2022, 8:22 AM

## 2022-03-29 NOTE — Progress Notes (Signed)
Physical Therapy Session Note  Patient Details  Name: Cindy Osborne MRN: [id number] Date of Birth: 04/16/1966  Today's Date: 03/29/2022 PT Individual Time: 1103-1200 amd 1404-1445 PT Individual Time Calculation (min): 57 min and 41 min   Short Term Goals: Week 1:  PT Short Term Goal 1 (Week 1): Patient will perform bed mobility with min A with our without use of bed rails. PT Short Term Goal 2 (Week 1): Patient will perform basic transfers with mod A >50% of the time. PT Short Term Goal 3 (Week 1): Patient will perform sit to stand with max A using LRAD. PT Short Term Goal 4 (Week 1): Patient will initiate w/c mobility. Week 2:    Week 3:     Skilled Therapeutic Interventions/Progress Updates:  Session 1  Pt received supine in bed and agreeable to PT. Donning brief and pants from supine with mod assist for rolling and +2 for clothing management. Supine>sit transfer with mod assist and cues for log roll technique and attention to the LUE/LLE. Sitting balance EOB to don Bil prosthetics with min assist for balance to prevent lateral LOB to the L.   Slide board transfer to WC with mod assist and +2 for safety to stabilize the slide board.   WC propulsion with hemi technique and max cues for sequencing and attention to task x 100ft.   Sit<>stand at raill in hall with max assist from PT to facilitate trunk control and block the LLE. Performed x 4. Standing tolerance x 2 bouts up to 35 sec prior to requesting rest break.    Gait training at rail in hall 2 x 5 ft with max assist +2 for WC follow with assist from PT for midline step length on the LLE, and improved sequencing of RUE/RLE, but increasing pushers syndrom with fatigue.   Kinetron reciprocal movement training 3 x 1 min with max cues for attention to task and full ROM on the LLE.   Patient returned to room and left sitting in WC with call bell in reach and all needs met.     Session 2  Pt received sitting in WC and agreeable to  PT  Slide board transfer training to mat table with mod assist from PT and cues for improved WB through LLE throughout transfer. .   Sitting balance with lateral reach R and L x 12 bil with hand over hand assist from PT with LUE reach. Pt able to initiate all shoulder movement through the LUE including grasp and release with max cues for attention to the LUE   Sit<>stand in stedy with BUE supported on bar x 2 with cues for anterior weight shift and improved trunkal/hip extension in full standing.   Forward reach with RUE to place clothes pins on horizontal bar x10 in partial standing in stedy. Min assist to prevent pushing to the L. Stedy transfer to WC with mod assist from PT.   Patient returned to room and left sitting in WC with call bell in reach and all needs met.          Therapy Documentation Precautions:  Precautions Precautions: Fall, Other (comment) Precaution Comments: L hemiplegia, Bil BKA 2 years ago and 1.5 years ago Required Braces or Orthoses: Other Brace Other Brace: bil prosthetic Restrictions Weight Bearing Restrictions: No  Pain: denies    Therapy/Group: Individual Therapy  Austin E Tucker 03/29/2022, 1:00 PM  

## 2022-03-29 NOTE — Progress Notes (Addendum)
PROGRESS NOTE   Subjective/Complaints:  MIld hip pain , working with SLP this am   ROS: Patient denies CP, SOB, N/V/D   Objective:   No results found. Recent Labs    03/27/22 0558 03/28/22 1444  WBC 7.5 8.1  HGB 11.5* 11.5*  HCT 37.3 36.1  PLT 238 253    Recent Labs    03/27/22 0558 03/28/22 1444  NA 136 133*  K 4.1 4.2  CL 95* 94*  CO2 27 24  GLUCOSE 138* 162*  BUN 35* 52*  CREATININE 7.93* 9.70*  CALCIUM 9.4 9.4     Intake/Output Summary (Last 24 hours) at 03/29/2022 0813 Last data filed at 03/29/2022 0600 Gross per 24 hour  Intake 358 ml  Output 2000 ml  Net -1642 ml         Physical Exam: Vital Signs Blood pressure 139/75, pulse 78, temperature 98.5 F (36.9 C), resp. rate 16, weight 103.3 kg, SpO2 100 %.  General: No acute distress Mood and affect are appropriate Heart: Regular rate and rhythm no rubs murmurs or extra sounds Lungs: Clear to auscultation, breathing unlabored, no rales or wheezes Abdomen: Positive bowel sounds, soft nontender to palpation, nondistended Extremities: No clubbing, cyanosis, or edema Skin: No evidence of breakdown, no evidence of rash  Skin: No evidence of breakdown, no evidence of rash Neurologic: Cranial nerves II through XII intact, motor strength is 5/5 in right deltoid, bicep, tricep, grip, hip flexor, knee extensors, 1-2/5 left delt, biceps, 2-triceps and finger flexors, 1/5 hip flexors and 3 knee flexors and extensors Sensory exam normal sensation to light touch and proprioception in right upper and lower extremities, absent LT sensation LUE, intact LLE Cerebellar exam normal finger to nose to finger as well as heel to shin in bilateral upper and lower extremities Musculoskeletal: no pain with left hip ROM, no tenderness to palpation    Assessment/Plan: 1. Functional deficits which require 3+ hours per day of interdisciplinary therapy in a comprehensive  inpatient rehab setting. Physiatrist is providing close team supervision and 24 hour management of active medical problems listed below. Physiatrist and rehab team continue to assess barriers to discharge/monitor patient progress toward functional and medical goals  Care Tool:  Bathing    Body parts bathed by patient: Left arm, Chest, Abdomen, Right upper leg, Face   Body parts bathed by helper: Front perineal area, Buttocks, Right arm Body parts n/a: Left lower leg, Right lower leg   Bathing assist Assist Level: Maximal Assistance - Patient 24 - 49%     Upper Body Dressing/Undressing Upper body dressing   What is the patient wearing?: Pull over shirt    Upper body assist Assist Level: Moderate Assistance - Patient 50 - 74%    Lower Body Dressing/Undressing Lower body dressing      What is the patient wearing?: Pants     Lower body assist Assist for lower body dressing: Maximal Assistance - Patient 25 - 49%     Toileting Toileting    Toileting assist Assist for toileting: Dependent - Patient 0%     Transfers Chair/bed transfer  Transfers assist  Chair/bed transfer activity did not occur: Safety/medical concerns  Chair/bed  transfer assist level: 2 Helpers     Locomotion Ambulation   Ambulation assist   Ambulation activity did not occur: Safety/medical concerns          Walk 10 feet activity   Assist  Walk 10 feet activity did not occur: Safety/medical concerns        Walk 50 feet activity   Assist Walk 50 feet with 2 turns activity did not occur: Safety/medical concerns         Walk 150 feet activity   Assist Walk 150 feet activity did not occur: Safety/medical concerns         Walk 10 feet on uneven surface  activity   Assist Walk 10 feet on uneven surfaces activity did not occur: Safety/medical concerns         Wheelchair     Assist Is the patient using a wheelchair?: Yes Type of Wheelchair: Manual    Wheelchair  assist level: Dependent - Patient 0% Max wheelchair distance: >150 feet    Wheelchair 50 feet with 2 turns activity    Assist        Assist Level: Dependent - Patient 0%   Wheelchair 150 feet activity     Assist      Assist Level: Dependent - Patient 0%   Blood pressure 139/75, pulse 78, temperature 98.5 F (36.9 C), resp. rate 16, weight 103.3 kg, SpO2 100 %.  Medical Problem List and Plan: 1. Functional deficits secondary to RIght MCA cardioembolic infarct complicated by prior hx B BKA             -patient may  shower             -ELOS/Goals: MinA mobility and ADL, use R BKA prothetic to transfer to [facility] , sliding board Team conference today please see physician documentation under team conference tab, met with team  to discuss problems,progress, and goals. Formulized individual treatment plan based on medical history, underlying problem and comorbidities.  2.  Antithrombotics: -DVT/anticoagulation:  Pharmaceutical: Eliquis             -antiplatelet therapy: N/A 3. Pain Management: Tylenol prn.  4. Mood/Behavior/Sleep: LCSW to follow for evaluation and support             -continue Elavil at bedtime for insomnia.              -antipsychotic agents: N/A 5. Neuropsych/cognition: This patient is capable of making decisions on her own behalf. 6. Skin/Wound Care: Routine pressure relief measures.  7. Fluids/Electrolytes/Nutrition: Strict I/O. Add ed1200 cc FR/day.             --daily weights.  8. New onset A fib w/RVR: Monitor HR TID continue Eliquis. 9. T2DM: Hgb A1C- 6.0 and well controlled. Was on insulin Glargine 18 units with 5 units TID meal coverage.  CBG (last 3)  Recent Labs    03/28/22 1158 03/28/22 2138 03/29/22 0603  GLUCAP 127* 131* 161*    Controlled 8/16,10. ESRD: HD TTS at the end of the day to help with tolerance of therapy during the day.  Appreciate Nephro assistance  11. Small thyroid nodule: Follow up with PCP after discharge for further work  up.  12.  Patchy/nodular chest densities: CT chest recommended for follow-up 13. OSA: Continue BIPAP when asleep.   54.  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  Fall PTA with Left hip and shoulder pain check xrays although exam appears mainly consistent with post  CVA shouolder pain as well as Left hip troch bursitis  8/14-mild to moderate OA visible on xrays of left shoulder and pelvis    -add voltaren gel to shoulder and hip pain 16.Dysphagia- per SLP  LOS: 4 days A FACE TO FACE EVALUATION WAS PERFORMED  Charlett Blake 03/29/2022, 8:13 AM

## 2022-03-29 NOTE — Patient Care Conference (Cosign Needed Addendum)
Inpatient RehabilitationTeam Conference and Plan of Care Update Date: 03/29/2022   Time: 10:04 AM    Patient Name: Cindy Osborne      Medical Record Number: [id number]  Date of Birth: 03-08-1966 Sex: Female         Room/Bed: 4W17C/4W17C-01 Payor Info: Payor: MEDICARE / Plan: MEDICARE PART A AND B / Product Type: *No Product type* /    Admit Date/Time:  03/25/2022  1:40 PM  Primary Diagnosis:  Acute ischemic right MCA stroke [facility])  Hospital Problems: Principal Problem:   Acute ischemic right MCA stroke [facility])    Expected Discharge Date: Expected Discharge Date: 04/17/22  Team Members Present: Physician leading conference: Dr. Alysia Penna Social Worker Present: Loralee Pacas, [place] Nurse Present: Dorien Chihuahua, RN PT Present: Barrie Folk, PT OT Present: Meriel Pica, OT SLP Present: Sherren Kerns, SLP PPS Coordinator present : Gunnar Fusi, SLP     Current Status/Progress Goal Weekly Team Focus  Bowel/Bladder   oliguric, overflow urine in past few days q8hr PVR; LBM: 8/12, prn laxative given  gain regular bowel pattern  assist with toileting needs prn   Swallow/Nutrition/ Hydration   dys 3, thin liquids, min A, some impulsiveness with intake  sup A  diet tolerance with implementation of swallowing precautions/strategies   ADL's   total A LB self care, max A UB self care, total transfers  CGA overall (min to mod A with LB self care may be more realistic)  LUE NMR, transfer training, ADL training, balance/ postural control, pt education   Mobility   Min A bed mobility, mod A SBT, mod +2 sit to stand with R rail, and dependent transfers with Clarise Cruz Plus with +2 nursing staff  Min A overall, supervision w/c mobility>100 ft (no gait goals pending progress)  sitting and standing balance, functional transfers, activity tolerance, L hemi-body motor control, midline orientation in standing, initiate gait training and w/c mobility as able, patient/caregiver education    Communication   min A  sup A  speech intelligiblity strategies   Safety/Cognition/ Behavioral Observations  mod-to-max A  sup A  attention, memory, problem solving, awareness   Pain   no c/o pain  remain pain free  assess pain QS and prn   Skin   R groin incision with gauze, LUE fistula  remain free of new skin breakdown/infection  assess skin QS and prn     Discharge Planning:  Patient discharging home with mother (supervision only). Sister able to provide some PRN assistance (works Education officer, community) Counsellor. HC resources provided to family & family requesting HH.   Team Discussion: Patient with left shoulder and hip pain; xray completed and medication added per MD. Right MCA with hx of DM, ESRD/HD-T,H, Sa and bil BKA and is limited by left inattention, slow process, and fatigue.  Patient on target to meet rehab goals: yes, currently needs total assist for lower body care and toileting. Requires max assist for upper body care. Finger edema; positioning and use of 1/2 lap tray. Can complete slide board transfer but does better with a SARA +.. Needs max assist - total for transfers. Min - mod assist for slide board and able to stand at the rail with +2 for safety. Needs min verbal cues for cognition, working on error awareness, and problem solving. Goals for discharge set for min assist overall.  *See Care Plan and progress notes for long and short-term goals.   Revisions to Treatment Plan:  Downgraded goals to min assist for SLP  E-stim for shoulder Teaching Needs: Safety, transfers, medications, toileting, dietary modifications, etc  Current Barriers to Discharge: Decreased caregiver support, Home enviroment access/layout, and Hemodialysis  Possible Resolutions to Barriers: Family educ on 8/21 and 8/28 8a-12pm for them to come two separate times to get an idea on how much support she will need at discharge.  If she is going to Mid to Mod A at d/c, she will be  placement Private duty company list provided to patient [facility] follow up services if she goes home     Medical Summary Current Status: Lft shoulder and hip pain , Xrays neg for acute process, ESRD, s/p B BKA, left HP, mainly affecting LUE  Barriers to Discharge: Other (comments)  Barriers to Discharge Comments: prior hx B BKA Possible Resolutions to [employer] Focus: PT to work on prothetic fit , may need prosthetist assist , nephro to manage HD and ESRD   Continued Need for Acute Rehabilitation Level of Care: The patient requires daily medical management by a physician with specialized training in physical medicine and rehabilitation for the following reasons: Direction of a multidisciplinary physical rehabilitation program to maximize functional independence : Yes Medical management of patient stability for increased activity during participation in an intensive rehabilitation regime.: Yes Analysis of laboratory values and/or radiology reports with any subsequent need for medication adjustment and/or medical intervention. : Yes   I attest that I was present, lead the team conference, and concur with the assessment and plan of the team.   Dorien Chihuahua B 03/29/2022, 2:06 PM

## 2022-03-29 NOTE — Progress Notes (Signed)
Occupational Therapy Session Note  Patient Details  Name: Cindy Osborne MRN: [id number] Date of Birth: July 05, 1966  Today's Date: 03/29/2022 OT Individual Time: 0915-1000 OT Individual Time Calculation (min): 45 min    Short Term Goals: Week 1:  OT Short Term Goal 1 (Week 1): Patient willl perform toilet transfer with max A of 1 OT Short Term Goal 2 (Week 1): Patient demonstrated impoved awareness of L UE by positioning prior to transfer with min verbal cues. OT Short Term Goal 3 (Week 1): Patient will complete maintain sitting balance at EOB with close supervision  Skilled Therapeutic Interventions/Progress Updates:    Pt received in bed and stated she needed to toilet.  Brought wide bariatric BSC next to bed.  Helped pt don B prosthesis with total A. L one difficult to don due to edema in L limb.  Used hospital bed pad between pt and slide board for skin protection.  Moving to her R side, pt needed max A of 2 to move on board.  It was a challenging transfer to avoid board from sliding and it was difficult to position her as she needs total A to adjust L hip on BSC. Total A to L leg and max with lateral leans to remove her brief (which was slightly wet) and the pad from under her.   Pt sat for awhile but unable to void.  Pt very talkative and would often get distracted demonstrating poor attention span. She does have trace scapular elevation and 25% of finger flex and ext, slight bicep flexion.   Encouraged pt to try to move hand as much as she can. She initially c/o L sh pain, but once she was up and moving the sh pain was not a barrier. She has L hand edema, retrograde massage to L hand.  To move back to bed, used sara plus with B prosthesis on.  Pt did well with this transfer.  Once standing in [place] plus,  had pt practice glute squeezes to engage L hip.  Pt sat back on bed and then needed max A to adjust to supine. Placed sign in room for L arm positioning, Placed pillow under L arm for  edema control. Pt resting in bed with sheet covering her. NT aware she will need assist with cleansing.   Therapy Documentation Precautions:  Precautions Precautions: Fall, Other (comment) Precaution Comments: L hemiplegia, Bil BKA 2 years ago and 1.5 years ago Required Braces or Orthoses: Other Brace Other Brace: bil prosthetic Restrictions Weight Bearing Restrictions: No   Pain: c/o L sh pain initially, then no more complaints.    ADL: ADL Eating: Minimal assistance Grooming: Setup Where Assessed-Grooming: Edge of bed Upper Body Bathing: Maximal assistance Lower Body Bathing: Dependent Upper Body Dressing: Moderate assistance Where Assessed-Upper Body Dressing: Edge of bed Lower Body Dressing: Moderate assistance Where Assessed-Lower Body Dressing: Edge of bed, Bed level Toileting: Dependent Toilet Transfer: Dependent Toilet Transfer Method: Other (comment) Clarise Cruz Plus) Science writer: Extra wide bedside commode      Therapy/Group: Individual Therapy  [place] 03/29/2022, 12:31 PM

## 2022-03-29 NOTE — Progress Notes (Signed)
Contacted [facility] to provide update on pt's d/c date from inpt rehab (9/4). Will assist as needed.   Melven Sartorius Renal Navigator [phone]

## 2022-03-29 NOTE — Progress Notes (Addendum)
Patient ID: Cindy Osborne, female   DOB: June 12, 1966, 56 y.o.   MRN: [id number]  This SW covering for primary SW, Erlene Quan.   SW met with pt in room to provide updates from team conference, and d/c date 9/4. She is excited about discharging on her birthday. She asks SW to make contact with her mother and sister Ivin Booty to give updates.    1419-SW spoke with pt mother Ms.Stann Mainland and Dannial Monarch at separate times to provide updates from team conference, and d/c date. SW discussed continued supervision and physical assistance pt will likely require at discharge. Both recognize that this is not likely an option as the mother is not able to physically support. SW explained options with additional assistance in the home such as private pay for aide care, aside from [facility] skilled nursing therapies. SW shared alternative being short term rehab in skilled nursing home. Pt mother reports she has discussed this with pt at one point because she knows they may not be able to care for her. SW discussed the importance of family education and allowing this to be a Recruitment consultant on best discharge plan. Family realizes this will be an ongoing conversation. Fam edu scheduled for 8/21 and 8/28 8am-12pm. SW shared with attending/PA pt mother request for follow-up about medical questions.   Loralee Pacas, MSW, [place] Office: [phone] Cell: [phone] Fax: [phone]

## 2022-03-29 NOTE — Progress Notes (Signed)
[facility] Progress Note   Subjective:  Completed dialysis yesterday with no issues. Feels good today.    Objective Vitals:   03/28/22 1800 03/28/22 1830 03/28/22 2010 03/29/22 0351  BP: (!) 144/83 (!) 146/74 (!) 111/28 139/75  Pulse: 70 70 73 78  Resp: 20 (!) 23 18 16   Temp:  (!) 97.5 F (36.4 C) 97.6 F (36.4 C) 98.5 F (36.9 C)  TempSrc:  Oral    SpO2: 100% 100% 100% 100%  Weight:  103.3 kg     Physical Exam General: NAD Heart: RRR, no murmurs, rubs or gallops Lungs: CTA bilaterally without wheezing, rhonchi or rales Abdomen: Soft, non-distended, +BS Extremities: No edema b/l lower extremities Neuro: awake, alert Dialysis Access:  LUE AVF + bruit  Additional Objective Labs: Basic Metabolic Panel: Recent Labs  Lab 03/24/22 1019 03/27/22 0558 03/28/22 1444  NA 136 136 133*  K 4.0 4.1 4.2  CL 93* 95* 94*  CO2 27 27 24   GLUCOSE 144* 138* 162*  BUN 30* 35* 52*  CREATININE 6.77* 7.93* 9.70*  CALCIUM 9.9 9.4 9.4  PHOS 5.3*  --  7.7*    Liver Function Tests: Recent Labs  Lab 03/24/22 1019 03/27/22 0558 03/28/22 1444  AST  --  16  --   ALT  --  15  --   ALKPHOS  --  110  --   BILITOT  --  0.7  --   PROT  --  6.6  --   ALBUMIN 3.2* 2.9* 2.8*    No results for input(s): "LIPASE", "AMYLASE" in the last 168 hours. CBC: Recent Labs  Lab 03/24/22 1019 03/25/22 0222 03/27/22 0558 03/28/22 1444  WBC 8.9 8.7 7.5 8.1  NEUTROABS  --   --  4.1  --   HGB 12.0 11.7* 11.5* 11.5*  HCT 37.4 36.3 37.3 36.1  MCV 86.8 86.6 88.6 87.6  PLT 217 234 238 253    Blood Culture No results found for: "SDES", "SPECREQUEST", "CULT", "REPTSTATUS"  Cardiac Enzymes: No results for input(s): "CKTOTAL", "CKMB", "CKMBINDEX", "TROPONINI" in the last 168 hours. CBG: Recent Labs  Lab 03/27/22 2031 03/28/22 0614 03/28/22 1158 03/28/22 2138 03/29/22 0603  GLUCAP 246* 124* 127* 131* 161*    Iron Studies: No results for input(s): "IRON", "TIBC",  "TRANSFERRIN", "FERRITIN" in the last 72 hours. @lablastinr3 @ Studies/Results: No results found. Medications:   amitriptyline  25 mg Oral QHS   apixaban  5 mg Oral BID   Chlorhexidine Gluconate Cloth  6 each Topical Q0600   insulin aspart  0-5 Units Subcutaneous QHS   insulin aspart  0-6 Units Subcutaneous TID WC   metoprolol succinate  200 mg Oral Daily   pravastatin  40 mg Oral q1800    Dialysis Orders: [place] [facility] ([facility] [phone])   4h  104.3kg  400/600  2/2.5 bath  Heparin 2000 LUA AVF - mircera 150 ug q2, last 8/5, last Hb 9.7 - calcitriol 0.5 ug tiw - sensipar 90 po tiw    Assessment/Plan: Acute CVA - sp intervention 8/8. Was here in June for similar event. Per stroke team. In CIR pAFib - Cardiology on board.  On Toprol 200mg  daily Acute resp failure w/ hypoxemia -  Resolved.  Volume- didn't tolerate vol removal w/ HD. suspect CXR  may have been poor quality, vs neurogenic pulm edema not related to vol overload. Improved. Now below dry weight.  ESRD - on HD TTS. Will need new EDW at discharge HTN - BP acceptable. Metoprolol  resumed per cardiology Anemia of CKD - Hgb 11.5 8/14.  Esa last given 8/5, no esa needs.  8.   MBD ckd - CCa high, VDRA on hold. Typically takes sensipar, will resume. Phos is at goal.      Lynnda Child PA-C [facility] 03/29/2022,8:57 AM

## 2022-03-30 DIAGNOSIS — I63511 Cerebral infarction due to unspecified occlusion or stenosis of right middle cerebral artery: Secondary | ICD-10-CM | POA: Diagnosis not present

## 2022-03-30 LAB — GLUCOSE, CAPILLARY
Glucose-Capillary: 154 mg/dL — ABNORMAL HIGH (ref 70–99)
Glucose-Capillary: 132 mg/dL — ABNORMAL HIGH (ref 70–99)

## 2022-03-30 NOTE — Progress Notes (Signed)
Slept well last night. Bladder scanned and cath this am. Denies pain. Please see I/O in flow sheet  for details. Scheduled for dialysis today. Safety maintained at all times.

## 2022-03-30 NOTE — Progress Notes (Signed)
PROGRESS NOTE   Subjective/Complaints:  Discussed d/c and the LTG, explained pt will need physical assist post D/C .  Per SW mother not able to provide physical , mom and sister coming in today   Pt states left hand feels cool at times , discussed swelling as part of UE issues post CVA   ROS: Patient denies CP, SOB, N/V/D   Objective:   No results found. Recent Labs    03/28/22 1444  WBC 8.1  HGB 11.5*  HCT 36.1  PLT 253    Recent Labs    03/28/22 1444  NA 133*  K 4.2  CL 94*  CO2 24  GLUCOSE 162*  BUN 52*  CREATININE 9.70*  CALCIUM 9.4     Intake/Output Summary (Last 24 hours) at 03/30/2022 0907 Last data filed at 03/30/2022 0900 Gross per 24 hour  Intake 775 ml  Output 650 ml  Net 125 ml         Physical Exam: Vital Signs Blood pressure 125/65, pulse 72, temperature 98.4 F (36.9 C), temperature source Oral, resp. rate 14, weight 103.3 kg, SpO2 98 %.  General: No acute distress Mood and affect are appropriate Heart: Regular rate and rhythm no rubs murmurs or extra sounds Lungs: Clear to auscultation, breathing unlabored, no rales or wheezes Abdomen: Positive bowel sounds, soft nontender to palpation, nondistended Extremities:mild LUE edema , strong pulse Left arm AV fistula , hand is warm Skin: No evidence of breakdown, no evidence of rash  Skin: No evidence of breakdown, no evidence of rash Neurologic: Cranial nerves II through XII intact, motor strength is 5/5 in right deltoid, bicep, tricep, grip, hip flexor, knee extensors, 1-2/5 left delt, biceps, 2-triceps and finger flexors, 1/5 hip flexors and 3 knee flexors and extensors Sensory exam normal sensation to light touch and proprioception in right upper and lower extremities, absent LT sensation LUE, intact LLE Cerebellar exam normal finger to nose to finger as well as heel to shin in bilateral upper and lower extremities Musculoskeletal:  no pain with left hip ROM, no tenderness to palpation    Assessment/Plan: 1. Functional deficits which require 3+ hours per day of interdisciplinary therapy in a comprehensive inpatient rehab setting. Physiatrist is providing close team supervision and 24 hour management of active medical problems listed below. Physiatrist and rehab team continue to assess barriers to discharge/monitor patient progress toward functional and medical goals  Care Tool:  Bathing    Body parts bathed by patient: Left arm, Chest, Abdomen, Right upper leg, Face   Body parts bathed by helper: Front perineal area, Buttocks, Right arm Body parts n/a: Left lower leg, Right lower leg   Bathing assist Assist Level: Maximal Assistance - Patient 24 - 49%     Upper Body Dressing/Undressing Upper body dressing   What is the patient wearing?: Pull over shirt    Upper body assist Assist Level: Moderate Assistance - Patient 50 - 74%    Lower Body Dressing/Undressing Lower body dressing      What is the patient wearing?: Pants     Lower body assist Assist for lower body dressing: Maximal Assistance - Patient 25 - 49%  Toileting Toileting    Toileting assist Assist for toileting: Dependent - Patient 0%     Transfers Chair/bed transfer  Transfers assist  Chair/bed transfer activity did not occur: Safety/medical concerns  Chair/bed transfer assist level: 2 Helpers     Locomotion Ambulation   Ambulation assist   Ambulation activity did not occur: Safety/medical concerns          Walk 10 feet activity   Assist  Walk 10 feet activity did not occur: Safety/medical concerns        Walk 50 feet activity   Assist Walk 50 feet with 2 turns activity did not occur: Safety/medical concerns         Walk 150 feet activity   Assist Walk 150 feet activity did not occur: Safety/medical concerns         Walk 10 feet on uneven surface  activity   Assist Walk 10 feet on uneven  surfaces activity did not occur: Safety/medical concerns         Wheelchair     Assist Is the patient using a wheelchair?: Yes Type of Wheelchair: Manual    Wheelchair assist level: Dependent - Patient 0% Max wheelchair distance: >150 feet    Wheelchair 50 feet with 2 turns activity    Assist        Assist Level: Dependent - Patient 0%   Wheelchair 150 feet activity     Assist      Assist Level: Dependent - Patient 0%   Blood pressure 125/65, pulse 72, temperature 98.4 F (36.9 C), temperature source Oral, resp. rate 14, weight 103.3 kg, SpO2 98 %.  Medical Problem List and Plan: 1. Functional deficits secondary to RIght MCA cardioembolic infarct complicated by prior hx B BKA             -patient may  shower             -ELOS/Goals: MinA mobility and ADL, use R BKA prothetic to transfer to [place] ,  2.  Antithrombotics: -DVT/anticoagulation:  Pharmaceutical: Eliquis             -antiplatelet therapy: N/A 3. Pain Management: Tylenol prn.  4. Mood/Behavior/Sleep: LCSW to follow for evaluation and support             -continue Elavil at bedtime for insomnia.              -antipsychotic agents: N/A 5. Neuropsych/cognition: This patient is capable of making decisions on her own behalf. 6. Skin/Wound Care: Routine pressure relief measures.  7. Fluids/Electrolytes/Nutrition: Strict I/O. Add ed1200 cc FR/day.             --daily weights.  8. New onset A fib w/RVR: Monitor HR TID continue Eliquis. 9. T2DM: Hgb A1C- 6.0 and well controlled. Was on insulin Glargine 18 units with 5 units TID meal coverage.  CBG (last 3)  Recent Labs    03/29/22 1204 03/29/22 1656 03/29/22 2127  GLUCAP 152* 175* 189*    Controlled 8/17,10. ESRD: HD TTS at the end of the day to help with tolerance of therapy during the day.  Appreciate Nephro assistance - discussed with Nephro, no vascular access issues  11. Small thyroid nodule: Follow up with PCP after discharge for further  work up.  12.  Patchy/nodular chest densities: CT chest recommended for follow-up 13. OSA: Continue BIPAP when asleep.   15.  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  Fall PTA with Left hip  and shoulder pain check xrays although exam appears mainly consistent with post CVA shouolder pain as well as Left hip troch bursitis  8/14-mild to moderate OA visible on xrays of left shoulder and pelvis    -add voltaren gel to shoulder and hip pain 16.Dysphagia- per SLP  LOS: 5 days A FACE TO FACE EVALUATION WAS PERFORMED  Charlett Blake 03/30/2022, 9:07 AM

## 2022-03-30 NOTE — Progress Notes (Signed)
Speech Language Pathology Daily Session Note  Patient Details  Name: Cindy Osborne MRN: [id number] Date of Birth: Oct 01, 1965  Today's Date: 03/30/2022 SLP Individual Time: 0803-0900 SLP Individual Time Calculation (min): 57 min  Short Term Goals: Week 1: SLP Short Term Goal 1 (Week 1): Pt will consume current diet with minimal overt s/sx of aspiration with min A verbal cues to reduce rate of consumption, bite sizes, L pocketing/anterior spillage SLP Short Term Goal 2 (Week 1): Patient will implement speech intelligibility strategies at the sentence level with with min A verbal cues to monitor and self correct SLP Short Term Goal 3 (Week 1): Patient will utilize internal/external memory strategies to recall novel information with min A verbal cues SLP Short Term Goal 4 (Week 1): Patient will participate in further cognitive assessment SLP Short Term Goal 4 - Progress (Week 1): Met SLP Short Term Goal 5 (Week 1): Pt will demonstrate awareness of errors with mod A verbal/visual cues SLP Short Term Goal 6 (Week 1): Pt will complete mildly complex problem solving with mod-to-max A verbal/visual cues  Skilled Therapeutic Interventions: Skilled ST treatment focused on cognitive and dysphagia goals. Upon arrival, pt was laying supine while attempting to eat morning meal. She exhibited pocketing in L oral cavity, anterior spillage, and with pieces of food covering her chest and lap requiring min A verbal cues to bring attention to anterior spillage and pocketing. It appeared over half of her meal had been consumed without staff present despite recommendations for full supervision as prescribed. Call bell was on floor. SLP elevated HOB for further consumption of meal and providing at least min A verbal cues for safe swallowing precautions and strategies. SLP provided gentle education on positioning for safe swallowing and to withhold from consuming meals if staff is not present; press call bell for  assistance (although call bell was on floor at the time of arrival). SLP informed NT who stopped by pt's room ~0815; notified RN via secure epic chat; entered safety zone.   SLP facilitated orientation with min A cues for use of external aid to orient to date. SLP facilitated sustained attention, basic verbal reasoning, and error awareness task with overall min progressing to mod A verbal cues. Increased support needed as task progressed due to suspected cognitive fatigue and increasing internal distractibility.   Patient was left in bed with alarm activated and immediate needs within reach at end of session. Continue per current plan of care.      Pain None/denied  Therapy/Group: Individual Therapy  Patty Sermons 03/30/2022, 9:01 AM

## 2022-03-30 NOTE — Progress Notes (Signed)
[facility] Progress Note   Subjective:  patient seen and examined in room. No complaints. HD today   Objective Vitals:   03/28/22 2010 03/29/22 0351 03/29/22 1307 03/29/22 1957  BP: (!) 111/28 139/75 136/67 125/65  Pulse: 73 78 74 72  Resp: 18 16 15 14   Temp: 97.6 F (36.4 C) 98.5 F (36.9 C) 97.8 F (36.6 C) 98.4 F (36.9 C)  TempSrc: Oral   Oral  SpO2: 100% 100% 90% 98%  Weight:       Physical Exam General: NAD Heart: RRR, no murmurs, rubs or gallops Lungs: CTA bilaterally without wheezing, rhonchi or rales Abdomen: Soft, non-distended, +BS Extremities: No edema b/l lower extremities Neuro: awake, alert Dialysis Access:  LUE AVF + bruit  Additional Objective Labs: Basic Metabolic Panel: Recent Labs  Lab 03/24/22 1019 03/27/22 0558 03/28/22 1444  NA 136 136 133*  K 4.0 4.1 4.2  CL 93* 95* 94*  CO2 27 27 24   GLUCOSE 144* 138* 162*  BUN 30* 35* 52*  CREATININE 6.77* 7.93* 9.70*  CALCIUM 9.9 9.4 9.4  PHOS 5.3*  --  7.7*   Liver Function Tests: Recent Labs  Lab 03/24/22 1019 03/27/22 0558 03/28/22 1444  AST  --  16  --   ALT  --  15  --   ALKPHOS  --  110  --   BILITOT  --  0.7  --   PROT  --  6.6  --   ALBUMIN 3.2* 2.9* 2.8*   No results for input(s): "LIPASE", "AMYLASE" in the last 168 hours. CBC: Recent Labs  Lab 03/24/22 1019 03/25/22 0222 03/27/22 0558 03/28/22 1444  WBC 8.9 8.7 7.5 8.1  NEUTROABS  --   --  4.1  --   HGB 12.0 11.7* 11.5* 11.5*  HCT 37.4 36.3 37.3 36.1  MCV 86.8 86.6 88.6 87.6  PLT 217 234 238 253   Blood Culture No results found for: "SDES", "SPECREQUEST", "CULT", "REPTSTATUS"  Cardiac Enzymes: No results for input(s): "CKTOTAL", "CKMB", "CKMBINDEX", "TROPONINI" in the last 168 hours. CBG: Recent Labs  Lab 03/28/22 2138 03/29/22 0603 03/29/22 1204 03/29/22 1656 03/29/22 2127  GLUCAP 131* 161* 152* 175* 189*   Iron Studies: No results for input(s): "IRON", "TIBC", "TRANSFERRIN", "FERRITIN"  in the last 72 hours. @lablastinr3 @ Studies/Results: No results found. Medications:   amitriptyline  25 mg Oral QHS   apixaban  5 mg Oral BID   Chlorhexidine Gluconate Cloth  6 each Topical Q0600   insulin aspart  0-5 Units Subcutaneous QHS   insulin aspart  0-6 Units Subcutaneous TID WC   metoprolol succinate  200 mg Oral Daily   pravastatin  40 mg Oral q1800    Dialysis Orders: [place] [facility] ([facility] [phone])   4h  104.3kg  400/600  2/2.5 bath  Heparin 2000 LUA AVF - mircera 150 ug q2, last 8/5, last Hb 9.7 - calcitriol 0.5 ug tiw - sensipar 90 po tiw    Assessment/Plan: Acute CVA - sp intervention 8/8. Was here in June for similar event. Per stroke team. In CIR pAFib - Cardiology on board.  On Toprol 200mg  daily & eliquis Acute resp failure w/ hypoxemia -  Resolved.  Volume- didn't tolerate vol removal w/ HD. suspect CXR  may have been poor quality, vs neurogenic pulm edema not related to vol overload. Improved. Now below dry weight. Will adjust as needed  ESRD - on HD TTS. Will need new EDW at discharge HTN - BP acceptable. Metoprolol  resumed per cardiology Anemia of CKD - Hgb 11.5 8/14.  Esa last given 8/5, no esa needs.  8.   MBD ckd - CCa high, VDRA on hold. Sensipar resumed. Monitor PO4   Gean Quint, MD [facility]

## 2022-03-30 NOTE — Progress Notes (Signed)
Occupational Therapy Session Note  Patient Details  Name: Cindy Osborne MRN: [id number] Date of Birth: 07-01-1966  Today's Date: 03/30/2022 OT Individual Time: 0093-8182 OT Individual Time Calculation (min): 57 min    Short Term Goals: Week 1:  OT Short Term Goal 1 (Week 1): Patient willl perform toilet transfer with max A of 1 OT Short Term Goal 2 (Week 1): Patient demonstrated impoved awareness of L UE by positioning prior to transfer with min verbal cues. OT Short Term Goal 3 (Week 1): Patient will complete maintain sitting balance at EOB with close supervision  Skilled Therapeutic Interventions/Progress Updates:    Pt greeted semi-reclined in bed and reported need to go to the bathroom. Pt completed bed mobility with max A due to L hemiplegia. OT assist to don B prosthetics at EOB. Pt with difficulty initiating sit<>stand in stedy and required mod +2, multiple trials and moderate cues for anterior weight shift. Stedy transfer to wide [facility] with pt able to void bladder. Tried to work on reaching with R hand to help with pericare, but pt unable to maintain standing in stedy safely to assist requiring max A for peri-care and to don clean brief. Grooming tasks from wc at the sink with min A. OT assisted with braiding hair while pt worked on sitting balance with Pharmacist, hospital. OT placed SAEBO e-stim on wrist extensors. SAEBO left on for 60 minutes. OT returned to remove SAEBO with skin intact and no adverse reactions.  [place] Stim One 330 pulse width 35 Hz pulse rate On 8 sec/ off 8 sec Ramp up/ down 2 sec Symmetrical Biphasic wave form  Max intensity 168mA at 500 Ohm load   Therapy Documentation Precautions:  Precautions Precautions: Fall, Other (comment) Precaution Comments: L hemiplegia, Bil BKA 2 years ago and 1.5 years ago Required Braces or Orthoses: Other Brace Other Brace: bil prosthetic Restrictions Weight Bearing Restrictions: No  Pain: Pain Assessment Pain Scale:  0-10 Pain Score: 0-No pain    Therapy/Group: Individual Therapy  [name] 03/30/2022, 11:27 AM

## 2022-03-30 NOTE — Progress Notes (Signed)
Physical Therapy Session Note  Patient Details  Name: Cindy Osborne MRN: [id number] Date of Birth: 05/09/1966  Today's Date: 03/30/2022 PT Individual Time: 3462-1947 PT Individual Time Calculation (min): 62 min   Short Term Goals: Week 1:  PT Short Term Goal 1 (Week 1): Patient will perform bed mobility with min A with our without use of bed rails. PT Short Term Goal 2 (Week 1): Patient will perform basic transfers with mod A >50% of the time. PT Short Term Goal 3 (Week 1): Patient will perform sit to stand with max A using LRAD. PT Short Term Goal 4 (Week 1): Patient will initiate w/c mobility. Week 2:    Week 3:    Week 4:     Skilled Therapeutic Interventions/Progress Updates:   Pt received sitting in WC and agreeable to PT. Pt transported to rehab gym. Slide board transfer to mat table with mod-max assist and max cues for sequencing and head hips relationship. Sitting balance EOB to perform lateral reach to the R to force weight shifting and reduce pushers syndrome 2 x 12. Performed cross body reaches with the RUE to the L. WB through LUE through reaching task and cues for midline and improved RLE positoin to prevent pushers syndrome. Slide board transfer to [facility] with Mod assist and cues for sequencing as listed above.   Sit<>stand at rail in hall with visual feedback from mirror x 3 with max assist and max cues posture and knee extension on the LLE.   Pt reports need to return to bed. Stedy transfer to Bed with max assist and max cues for weight shifting. Sit>supine completed with max assist, and left supine in bed with call bell in reach and all needs met.        Therapy Documentation Precautions:  Precautions Precautions: Fall, Other (comment) Precaution Comments: L hemiplegia, Bil BKA 2 years ago and 1.5 years ago Required Braces or Orthoses: Other Brace Other Brace: bil prosthetic Restrictions Weight Bearing Restrictions: No General: PT Amount of Missed Time (min): 13  Minutes PT Missed Treatment Reason: Patient fatigue Vital Signs: Therapy Vitals Temp: (!) 97.1 F (36.2 C) Pulse Rate: 78 Resp: (!) 30 BP: 115/60 Patient Position (if appropriate): Lying Oxygen Therapy SpO2: 99 % O2 Device: Room Air Pain: Pain Assessment Pain Score: 0-No pain     Therapy/Group: Individual Therapy  Lorie Phenix 03/30/2022, 6:09 PM

## 2022-03-31 LAB — GLUCOSE, CAPILLARY
Glucose-Capillary: 149 mg/dL — ABNORMAL HIGH (ref 70–99)
Glucose-Capillary: 171 mg/dL — ABNORMAL HIGH (ref 70–99)
Glucose-Capillary: 150 mg/dL — ABNORMAL HIGH (ref 70–99)
Glucose-Capillary: 247 mg/dL — ABNORMAL HIGH (ref 70–99)

## 2022-03-31 NOTE — Progress Notes (Signed)
[facility] Progress Note   Subjective:  patient seen and examined in room. No complaints. Tolerated HD yesterday with net UF 2L   Objective Vitals:   03/30/22 1819 03/30/22 1917 03/31/22 0434 03/31/22 0500  BP:  (!) 106/59 (!) 113/59   Pulse:  74 72   Resp:  20 20   Temp:  98.4 F (36.9 C) (!) 97.4 F (36.3 C)   TempSrc:  Oral Oral   SpO2:  97% 96%   Weight: 103.5 kg   108.8 kg   Physical Exam General: NAD Heart: RRR, no murmurs, rubs or gallops Lungs: CTA bilaterally Abdomen: obese, soft, non-distended Extremities: No edema b/l lower extremities Neuro: awake, alert Dialysis Access:  LUE AVF + bruit  Additional Objective Labs: Basic Metabolic Panel: Recent Labs  Lab 03/24/22 1019 03/27/22 0558 03/28/22 1444  NA 136 136 133*  K 4.0 4.1 4.2  CL 93* 95* 94*  CO2 27 27 24   GLUCOSE 144* 138* 162*  BUN 30* 35* 52*  CREATININE 6.77* 7.93* 9.70*  CALCIUM 9.9 9.4 9.4  PHOS 5.3*  --  7.7*   Liver Function Tests: Recent Labs  Lab 03/24/22 1019 03/27/22 0558 03/28/22 1444  AST  --  16  --   ALT  --  15  --   ALKPHOS  --  110  --   BILITOT  --  0.7  --   PROT  --  6.6  --   ALBUMIN 3.2* 2.9* 2.8*   No results for input(s): "LIPASE", "AMYLASE" in the last 168 hours. CBC: Recent Labs  Lab 03/24/22 1019 03/25/22 0222 03/27/22 0558 03/28/22 1444  WBC 8.9 8.7 7.5 8.1  NEUTROABS  --   --  4.1  --   HGB 12.0 11.7* 11.5* 11.5*  HCT 37.4 36.3 37.3 36.1  MCV 86.8 86.6 88.6 87.6  PLT 217 234 238 253   Blood Culture No results found for: "SDES", "SPECREQUEST", "CULT", "REPTSTATUS"  Cardiac Enzymes: No results for input(s): "CKTOTAL", "CKMB", "CKMBINDEX", "TROPONINI" in the last 168 hours. CBG: Recent Labs  Lab 03/29/22 1656 03/29/22 2127 03/30/22 1149 03/30/22 2000 03/31/22 0612  GLUCAP 175* 189* 154* 132* 149*   Iron Studies: No results for input(s): "IRON", "TIBC", "TRANSFERRIN", "FERRITIN" in the last 72  hours. @lablastinr3 @ Studies/Results: No results found. Medications:   amitriptyline  25 mg Oral QHS   apixaban  5 mg Oral BID   Chlorhexidine Gluconate Cloth  6 each Topical Q0600   insulin aspart  0-5 Units Subcutaneous QHS   insulin aspart  0-6 Units Subcutaneous TID WC   metoprolol succinate  200 mg Oral Daily   pravastatin  40 mg Oral q1800    Dialysis Orders: [place] [facility] ([facility] [phone])   4h  104.3kg  400/600  2/2.5 bath  Heparin 2000 LUA AVF - mircera 150 ug q2, last 8/5, last Hb 9.7 - calcitriol 0.5 ug tiw - sensipar 90 po tiw    Assessment/Plan: Acute CVA - sp intervention 8/8. Was here in June for similar event. Per stroke team. In CIR pAFib - Cardiology on board.  On Toprol 200mg  daily & eliquis Acute resp failure w/ hypoxemia -  Resolved.  ESRD - on HD TTS. Will need new EDW at discharge HTN/Volume - BP acceptable. Metoprolol resumed per cardiology. UF'ing as tolerated Anemia of CKD - Hgb 11.5 8/15.  Esa last given 8/5, no esa needs.  8.   MBD ckd - CCa high, VDRA on hold. Sensipar resumed. Monitor  PO4   Gean Quint, MD [facility]

## 2022-03-31 NOTE — Evaluation (Signed)
Recreational Therapy Assessment and Plan  Patient Details  Name: Cindy Osborne MRN: [id number] Date of Birth: 11-May-1966 Today's Date: 03/31/2022  Rehab Potential:  Good ELOS:   d/c 9/4  Assessment Hospital Problem: Principal Problem:   Acute ischemic right MCA stroke [facility])     Past Medical History:      Past Medical History:  Diagnosis Date   Amputated toe ([place]) 01/31/2012    Right second toe distal phalanx Right great toe   Diabetes mellitus without complication ([place])     History of diabetic ulcer of foot 04/01/2014   Hyperlipidemia     Hypertension     Stroke ([place]) 01/21/2022    Past Surgical History:       Past Surgical History:  Procedure Laterality Date   angioplasty politeal Left 07/2020   avg for dialysis       CHOLECYSTECTOMY       COLONOSCOPY   08/15/2011    cleared for 10 yrs   IR CT HEAD LTD   01/21/2022   IR CT HEAD LTD   03/21/2022   IR PERCUTANEOUS ART THROMBECTOMY/INFUSION INTRACRANIAL INC DIAG ANGIO   01/21/2022   IR PERCUTANEOUS ART THROMBECTOMY/INFUSION INTRACRANIAL INC DIAG ANGIO   03/21/2022   IR US GUIDE VASC ACCESS RIGHT   01/21/2022   IR US GUIDE VASC ACCESS RIGHT   03/21/2022   LEG AMPUTATION BELOW KNEE Right 04/2019   LEG AMPUTATION BELOW KNEE Left 09/08/2020   RADIOLOGY WITH ANESTHESIA N/A 01/21/2022    Procedure: IR WITH ANESTHESIA;  Surgeon: Radiologist, Medication, MD;  Location: [place];  Service: Radiology;  Laterality: N/A;   RADIOLOGY WITH ANESTHESIA N/A 03/21/2022    Procedure: IR WITH ANESTHESIA;  Surgeon: Radiologist, Medication, MD;  Location: [place];  Service: Radiology;  Laterality: N/A;   TOE AMPUTATION Right 10/15/2018      Assessment & Plan Clinical Impression: Patient is a 56 y.o. year old female with recent admission to the hospital on with history of HTN, T2DM, B-BKA, CVA 6/23 who was admitted on 03/21/22 after fall with onset of left-sided weakness, left hemineglect and left facial droop with slurred speech. CTA head/Neck showed  acute right M2/MCA posterior division occlusion with 21 mL ischemic penumbra and scattered opacities bilateral lung apices likely infectious in nature but follow-up CT chest recommended due to nodular appearance.  She underwent cerebral angio with mechanical thrombectomy of partial occlusions of 2 out of 3 MCA trifurcation with complete recanalization by Dr. Gerhard Perches.  MRI brain done revealing moderate-sized patchy acute ischemic right MCA infarct affecting the right frontal and parietal lobes with associated petechial hemorrhage and few scattered chronic ischemic infarcts.   2 D echo of 06/23 showed EF 55 to 60% with moderate concentric LVH, moderately dilated left atrium and grade 2 DD.  BLE Dopplers were negative for DVT.  Stroke felt to be cardioembolic with plans for cardiac work-up however she developed A-fib with RVR on 08/10.  She was started on IV Cardizem and Dr. Stanford Breed recommended apixaban twice daily.  She converted to NSR and home dose Toprol resumed.  TSH ordered for work-up.  Hemodialysis has been ongoing and she is tolerating this without difficulty.  Dysphagia 2 diet with thin liquids recommended  Patient transferred to CIR on 03/25/2022 .   Met with pt today to discuss TR services and leisure interests.  Pt presents with decreased activity tolerance, decreased functional mobility, decreased balance, decreased coordination, left inattention, decreased initiation, decreased attention, decreased awareness, decreased problem solving, decreased  safety awareness, delayed processing Limiting pt's independence with leisure/community pursuits.  Plan  Min 1 TR session >20 minutes per week during LOS  Recommendations for other services: None   Discharge Criteria: Patient will be discharged from TR if patient refuses treatment 3 consecutive times without medical reason.  If treatment goals not met, if there is a change in medical status, if patient makes no progress towards goals or if patient is  discharged from hospital.  The above assessment, treatment plan, treatment alternatives and goals were discussed and mutually agreed upon: by patient  [place] 03/31/2022, 10:12 AM

## 2022-03-31 NOTE — Progress Notes (Signed)
Occupational Therapy Weekly Progress Note  Patient Details  Name: Cindy Osborne MRN: [id number] Date of Birth: [birth year]  Beginning of progress report period: March 25, 2022 End of progress report period: March 31, 2022   Patient has met 2 of 3 short term goals.  Patient is making steady progress towards OT goals. She has beeen able to stand in [place] with max A of 1 (with B prosthestics on) but occasionally needs +2 assist. She is very motivated and participating well in therapy. L UE function has improved with pt able to grasp /release, but unable to integrate functionally at this time. Continue current POC.   Patient continues to demonstrate the following deficits: muscle weakness, impaired timing and sequencing, abnormal tone, unbalanced muscle activation, ataxia, decreased coordination, and decreased motor planning, decreased midline orientation, decreased attention to left, left side neglect, and decreased motor planning, decreased initiation, decreased attention, decreased awareness, decreased problem solving, decreased safety awareness, decreased memory, and delayed processing, and decreased sitting balance, decreased standing balance, decreased postural control, hemiplegia, and decreased balance strategies and therefore will continue to benefit from skilled OT intervention to enhance overall performance with BADL and Reduce care partner burden.  Patient progressing toward long term goals..  Continue plan of care.  OT Short Term Goals Week 1:  OT Short Term Goal 1 (Week 1): Patient willl perform toilet transfer with max A of 1 OT Short Term Goal 1 - Progress (Week 1): Met OT Short Term Goal 2 (Week 1): Patient demonstrated impoved awareness of L UE by positioning prior to transfer with min verbal cues. OT Short Term Goal 2 - Progress (Week 1): Progressing toward goal OT Short Term Goal 3 (Week 1): Patient will complete maintain sitting balance at EOB with close supervision OT Short  Term Goal 3 - Progress (Week 1): Met Week 2:  OT Short Term Goal 1 (Week 2): Patient demonstrated impoved awareness of L UE by positioning prior to transfer with min verbal cues. OT Short Term Goal 2 (Week 2): Patient will recall hemi dressing techniques with min cues OT Short Term Goal 3 (Week 2): Patient will perform one step of toileting task   Therapy/Group: Individual Therapy  [name] 03/31/2022, 3:45 PM

## 2022-03-31 NOTE — Progress Notes (Signed)
Physical Therapy Session Note  Patient Details  Name: Cindy Osborne MRN: [id number] Date of Birth: 03-18-1966  Today's Date: 03/31/2022 PT Individual Time: 0810-0905 PT Individual Time Calculation (min): 55 min   Short Term Goals: Week 1:  PT Short Term Goal 1 (Week 1): Patient will perform bed mobility with min A with our without use of bed rails. PT Short Term Goal 2 (Week 1): Patient will perform basic transfers with mod A >50% of the time. PT Short Term Goal 3 (Week 1): Patient will perform sit to stand with max A using LRAD. PT Short Term Goal 4 (Week 1): Patient will initiate w/c mobility. Week 2:    Week 3:     Skilled Therapeutic Interventions/Progress Updates:   Pt received supine in bed and agreeable to PT. Supine>sit transfer with mod assist from semireclined position with cues for improved attention to the LUE and increased pelvic rotation.   Donning Bil Prosthetic sitting EOB with total A. Supervision assist from PT to maintain balance while donning prosthetics.  Slide board transfer to [facility] with mod assist and max cues for improved sequencing to lift buttock then initiate lateral scoot as well improve use of LLE.   Pt transported to rehab gym. Seated NMR LAQ AAROM on the LLE, hip flexion AAROM on the LLE. hip abduction manual resistance,  hip extension with manual resistance. Each performed x 12 BLE with cues full ROM and use of visual feedback for increased ROM with fatigue.   Sit<>stand x 2 at rail in hall with mod assist from PT and blocking the LLE> gait training with RUE supported on rail in hall and and visual feedback from mirror. Improved midline orientation on this day allowing improved weight shift R and advancement of the LLE.   Patient returned to room and requesting to perform facial hygiene. Face wash and shave completed sitting at sink with set up from PT. Pt left sitting in WC with call bell in reach and all needs met.         Therapy  Documentation Precautions:  Precautions Precautions: Fall, Other (comment) Precaution Comments: L hemiplegia, Bil BKA 2 years ago and 1.5 years ago Required Braces or Orthoses: Other Brace Other Brace: bil prosthetic Restrictions Weight Bearing Restrictions: No   Pain: denies    Therapy/Group: Individual Therapy  Lorie Phenix 03/31/2022, 9:38 AM

## 2022-03-31 NOTE — Progress Notes (Signed)
Occupational Therapy Session Note  Patient Details  Name: Cindy Osborne MRN: [id number] Date of Birth: 1966-07-19  Today's Date: 03/31/2022 OT Individual Time: 1300-1345 OT Individual Time Calculation (min): 45 min   Short Term Goals: Week 1:  OT Short Term Goal 1 (Week 1): Patient willl perform toilet transfer with max A of 1 OT Short Term Goal 2 (Week 1): Patient demonstrated impoved awareness of L UE by positioning prior to transfer with min verbal cues. OT Short Term Goal 3 (Week 1): Patient will complete maintain sitting balance at EOB with close supervision  Skilled Therapeutic Interventions/Progress Updates:    Pt greeted seated in wc and agreeable to OT treatment session. Pt brought to therapy gym and address sit<>stands, standing balane/endurance. L UE NMR, and NMES. Pt needed 3 trials to get to standing at high-low table with mod A, and facilitation for anterior weight shift and full upright hip and trunk. L UE NMR with weight bearing towel pushes 3 sets of 20 with pt requiring rest breaks in between sets. Up to max  A to stand after fatigue. L UE NMR with reaching and grasp using Squigzz. OT placed SAEBO e-stim on wrist extensors. SAEBO left on for 60 minutes. OT returned to remove SAEBO with skin intact and no adverse reactions.  [place] Stim One 330 pulse width 35 Hz pulse rate On 8 sec/ off 8 sec Ramp up/ down 2 sec Symmetrical Biphasic wave form  Max intensity 172mA at 500 Ohm load  Therapy Documentation Precautions:  Precautions Precautions: Fall, Other (comment) Precaution Comments: L hemiplegia, Bil BKA 2 years ago and 1.5 years ago Required Braces or Orthoses: Other Brace Other Brace: bil prosthetic Restrictions Weight Bearing Restrictions: No Pain:  Denies pain   Therapy/Group: Individual Therapy  [name] 03/31/2022, 1:25 PM

## 2022-03-31 NOTE — Progress Notes (Signed)
Speech Language Pathology Daily Session Note  Patient Details  Name: Cindy Osborne MRN: [id number] Date of Birth: March 05, 1966  Today's Date: 03/31/2022 SLP Individual Time: 1003-1100 SLP Individual Time Calculation (min): 57 min  Short Term Goals: Week 1: SLP Short Term Goal 1 (Week 1): Pt will consume current diet with minimal overt s/sx of aspiration with min A verbal cues to reduce rate of consumption, bite sizes, L pocketing/anterior spillage SLP Short Term Goal 2 (Week 1): Patient will implement speech intelligibility strategies at the sentence level with with min A verbal cues to monitor and self correct SLP Short Term Goal 3 (Week 1): Patient will utilize internal/external memory strategies to recall novel information with min A verbal cues SLP Short Term Goal 4 (Week 1): Patient will participate in further cognitive assessment SLP Short Term Goal 4 - Progress (Week 1): Met SLP Short Term Goal 5 (Week 1): Pt will demonstrate awareness of errors with mod A verbal/visual cues SLP Short Term Goal 6 (Week 1): Pt will complete mildly complex problem solving with mod-to-max A verbal/visual cues  Skilled Therapeutic Interventions: Skilled ST treatment focused on cognitive and speech goals. Pt received upright in wheelchair on arrival. Pt exhibited drowsiness throughout session and required sup A verbal/tactile stimulation to maintain arousal. SLP facilitated session by providing min A verbal cues to increase vocal intensity and reduce speech rate due to inconsistent speech intelligibility which was likely influenced by drowsiness. SLP facilitated cognitive tasks using the BITS including: sequential memory task with mod A verbal cues to achieve 53% accuracy; trail making task with max progressing to total A to remember to alternate between numbers and letters; solving maze task with min A verbal redirection cues due to internal distractibility; visual scanning task by selecting numbers in  sequential order with mod A to achieve 78% accuracy; selecting letters with mod A verbal cues for self monitoring and error awareness to to achieve 60% accuracy; visual scanning task at word level in field of 15 with mod fading to min A to visually scan left of midline. Patient was left in wheelchair with alarm activated and immediate needs within reach at end of session. Recreational Therapist and therapy dog arrived for visit in which pt seemed to enjoy. Continue per current plan of care.      Pain  None/denied  Therapy/Group: Individual Therapy  Tobie Perdue T Castor Gittleman 03/31/2022, 11:01 AM

## 2022-03-31 NOTE — Progress Notes (Signed)
Recreational Therapy Session Note  Patient Details  Name: Cindy Osborne MRN: [id number] Date of Birth: 07/05/1966 Today's Date: 03/31/2022  Pain: no c/o  Pt participated in animal assisted activity seated w/c level with supervision.  Pt easily engaged in conversation with pet partner team & was appreciative of this visit .  [place] 03/31/2022, 12:11 PM

## 2022-03-31 NOTE — Progress Notes (Signed)
Had a quite night this shift. Appeared exhausted after HD last night. Refused to have bladder scan and possible I/O catheterization. Tolerated medications and dinner after HD. VS remain stable. Family stayed over with patient. Safety maintained.

## 2022-03-31 NOTE — Progress Notes (Signed)
PROGRESS NOTE   Subjective/Complaints: Seen in PT, no new issues  Family stayed overnite last noc.Had HD yesterday , appreciate Nephro note   Pt states left hand feels cool at times , discussed swelling as part of UE issues post CVA   ROS: Patient denies CP, SOB, N/V/D   Objective:   No results found. Recent Labs    03/28/22 1444  WBC 8.1  HGB 11.5*  HCT 36.1  PLT 253    Recent Labs    03/28/22 1444  NA 133*  K 4.2  CL 94*  CO2 24  GLUCOSE 162*  BUN 52*  CREATININE 9.70*  CALCIUM 9.4     Intake/Output Summary (Last 24 hours) at 03/31/2022 0846 Last data filed at 03/31/2022 0748 Gross per 24 hour  Intake 540 ml  Output 2000 ml  Net -1460 ml         Physical Exam: Vital Signs Blood pressure (!) 113/59, pulse 72, temperature (!) 97.4 F (36.3 C), temperature source Oral, resp. rate 20, weight 108.8 kg, SpO2 96 %.  General: No acute distress Mood and affect are appropriate Heart: Regular rate and rhythm no rubs murmurs or extra sounds Lungs: Clear to auscultation, breathing unlabored, no rales or wheezes Abdomen: Positive bowel sounds, soft nontender to palpation, nondistended Extremities:mild LUE edema , strong pulse Left arm AV fistula , hand is warm Skin: No evidence of breakdown, no evidence of rash  Skin: No evidence of breakdown, no evidence of rash Neurologic: Cranial nerves II through XII intact, motor strength is 5/5 in right deltoid, bicep, tricep, grip, hip flexor, knee extensors, 1-2/5 left delt, biceps, 2-triceps and finger flexors, 1/5 hip flexors and 3 knee flexors and extensors Sensory exam normal sensation to light touch and proprioception in right upper and lower extremities, absent LT sensation LUE, intact LLE Cerebellar exam normal finger to nose to finger as well as heel to shin in bilateral upper and lower extremities Musculoskeletal: no pain with left hip ROM, no tenderness to  palpation    Assessment/Plan: 1. Functional deficits which require 3+ hours per day of interdisciplinary therapy in a comprehensive inpatient rehab setting. Physiatrist is providing close team supervision and 24 hour management of active medical problems listed below. Physiatrist and rehab team continue to assess barriers to discharge/monitor patient progress toward functional and medical goals  Care Tool:  Bathing    Body parts bathed by patient: Left arm, Chest, Abdomen, Right upper leg, Face   Body parts bathed by helper: Front perineal area, Buttocks, Right arm Body parts n/a: Left lower leg, Right lower leg   Bathing assist Assist Level: Maximal Assistance - Patient 24 - 49%     Upper Body Dressing/Undressing Upper body dressing   What is the patient wearing?: Pull over shirt    Upper body assist Assist Level: Moderate Assistance - Patient 50 - 74%    Lower Body Dressing/Undressing Lower body dressing      What is the patient wearing?: Pants     Lower body assist Assist for lower body dressing: Maximal Assistance - Patient 25 - 49%     Toileting Toileting    Toileting assist Assist for toileting:  Dependent - Patient 0%     Transfers Chair/bed transfer  Transfers assist  Chair/bed transfer activity did not occur: Safety/medical concerns  Chair/bed transfer assist level: 2 Helpers     Locomotion Ambulation   Ambulation assist   Ambulation activity did not occur: Safety/medical concerns          Walk 10 feet activity   Assist  Walk 10 feet activity did not occur: Safety/medical concerns        Walk 50 feet activity   Assist Walk 50 feet with 2 turns activity did not occur: Safety/medical concerns         Walk 150 feet activity   Assist Walk 150 feet activity did not occur: Safety/medical concerns         Walk 10 feet on uneven surface  activity   Assist Walk 10 feet on uneven surfaces activity did not occur:  Safety/medical concerns         Wheelchair     Assist Is the patient using a wheelchair?: Yes Type of Wheelchair: Manual    Wheelchair assist level: Dependent - Patient 0% Max wheelchair distance: >150 feet    Wheelchair 50 feet with 2 turns activity    Assist        Assist Level: Dependent - Patient 0%   Wheelchair 150 feet activity     Assist      Assist Level: Dependent - Patient 0%   Blood pressure (!) 113/59, pulse 72, temperature (!) 97.4 F (36.3 C), temperature source Oral, resp. rate 20, weight 108.8 kg, SpO2 96 %.  Medical Problem List and Plan: 1. Functional deficits secondary to RIght MCA cardioembolic infarct complicated by prior hx B BKA             -patient may  shower             -ELOS/Goals: MinA mobility and ADL, use R BKA prothetic to transfer to [place] ,  2.  Antithrombotics: -DVT/anticoagulation:  Pharmaceutical: Eliquis             -antiplatelet therapy: N/A 3. Pain Management: Tylenol prn.  4. Mood/Behavior/Sleep: LCSW to follow for evaluation and support             -continue Elavil at bedtime for insomnia.              -antipsychotic agents: N/A 5. Neuropsych/cognition: This patient is capable of making decisions on her own behalf. 6. Skin/Wound Care: Routine pressure relief measures.  7. Fluids/Electrolytes/Nutrition: Strict I/O. Add ed1200 cc FR/day.             --daily weights.  8. New onset A fib w/RVR: Monitor HR TID continue Eliquis. 9. T2DM: Hgb A1C- 6.0 and well controlled. Was on insulin Glargine 18 units with 5 units TID meal coverage.  CBG (last 3)  Recent Labs    03/30/22 1149 03/30/22 2000 03/31/22 0612  GLUCAP 154* 132* 149*    Controlled 8/17,10. ESRD: HD TTS at the end of the day to help with tolerance of therapy during the day.  Appreciate Nephro assistance - discussed with Nephro, no vascular access issues  11. Small thyroid nodule: Follow up with PCP after discharge for further work up.  12.   Patchy/nodular chest densities: CT chest recommended for follow-up 13. OSA: Continue BIPAP when asleep.   31.  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  Fall PTA with Left hip and shoulder pain check xrays although exam appears  mainly consistent with post CVA shoulder pain as well as Left hip troch bursitis  8/14-mild to moderate OA visible on xrays of left shoulder and pelvis    -add voltaren gel to shoulder and hip pain 16.Dysphagia- per SLP  LOS: 6 days A FACE TO FACE EVALUATION WAS PERFORMED  Charlett Blake 03/31/2022, 8:46 AM

## 2022-04-01 LAB — GLUCOSE, CAPILLARY
Glucose-Capillary: 132 mg/dL — ABNORMAL HIGH (ref 70–99)
Glucose-Capillary: 187 mg/dL — ABNORMAL HIGH (ref 70–99)
Glucose-Capillary: 159 mg/dL — ABNORMAL HIGH (ref 70–99)
Glucose-Capillary: 367 mg/dL — ABNORMAL HIGH (ref 70–99)

## 2022-04-01 NOTE — Progress Notes (Signed)
Received patient in bed to unit.  Alert and oriented.  Informed consent signed and in  chart.   Treatment initiated: 1341 Treatment completed: 1750  Patient tolerated well.  Transported back to the room  alert, without acute distress.  Hand-off given to patient's nurse.   Access used: Avgraft Access issues: none  Total UF removed: 2.9L Medication(s) given: Tylenol x1 Post HD VS: 97.8,75,100%,20,111/49 Post HD weight: 107.2kg   [facility]

## 2022-04-01 NOTE — Progress Notes (Signed)
PROGRESS NOTE   Subjective/Complaints: Pt up in bed finishing breakfast with SLP. No problems this morning. Was concerned however about a "growth" on back. Wanted me to look at it  ROS: Patient denies fever, rash, sore throat, blurred vision, dizziness, nausea, vomiting, diarrhea, cough, shortness of breath or chest pain, joint or back/neck pain, headache, or mood change.   Objective:   No results found. No results for input(s): "WBC", "HGB", "HCT", "PLT" in the last 72 hours. No results for input(s): "NA", "K", "CL", "CO2", "GLUCOSE", "BUN", "CREATININE", "CALCIUM" in the last 72 hours.  Intake/Output Summary (Last 24 hours) at 04/01/2022 1333 Last data filed at 04/01/2022 0700 Gross per 24 hour  Intake 358 ml  Output 500 ml  Net -142 ml        Physical Exam: Vital Signs Blood pressure 137/76, pulse 77, temperature 98 F (36.7 C), temperature source Oral, resp. rate 18, weight 108.8 kg, SpO2 100 %.  Constitutional: No distress . Vital signs reviewed. HEENT: NCAT, EOMI, oral membranes moist Neck: supple Cardiovascular: RRR without murmur. No JVD    Respiratory/Chest: CTA Bilaterally without wheezes or rales. Normal effort    GI/Abdomen: BS +, non-tender, non-distended Ext: no clubbing, cyanosis, or edema Psych: pleasant and cooperative  Skin: No evidence of breakdown, no evidence of rash. There is a perfectly round 1+cm lesion on her upper back near scapula. It appears to be scabbed over in center, no drainage. Is tender with palpation Neurologic: Cranial nerves II through XII intact, motor strength is 5/5 in right deltoid, bicep, tricep, grip, hip flexor, knee extensors, 1-2/5 left delt, biceps, 2-triceps and finger flexors, 1/5 hip flexors and 3 knee flexors and extensors--no changes today Sensory exam normal sensation to light touch and proprioception in right upper and lower extremities, absent LT sensation LUE, intact  LLE Cerebellar exam normal finger to nose to finger as well as heel to shin in bilateral upper and lower extremities Musculoskeletal: no pain with left hip ROM, no tenderness to palpation    Assessment/Plan: 1. Functional deficits which require 3+ hours per day of interdisciplinary therapy in a comprehensive inpatient rehab setting. Physiatrist is providing close team supervision and 24 hour management of active medical problems listed below. Physiatrist and rehab team continue to assess barriers to discharge/monitor patient progress toward functional and medical goals  Care Tool:  Bathing    Body parts bathed by patient: Left arm, Chest, Abdomen, Right upper leg, Face   Body parts bathed by helper: Front perineal area, Buttocks, Right arm Body parts n/a: Left lower leg, Right lower leg   Bathing assist Assist Level: Maximal Assistance - Patient 24 - 49%     Upper Body Dressing/Undressing Upper body dressing   What is the patient wearing?: Pull over shirt    Upper body assist Assist Level: Moderate Assistance - Patient 50 - 74%    Lower Body Dressing/Undressing Lower body dressing      What is the patient wearing?: Pants     Lower body assist Assist for lower body dressing: Maximal Assistance - Patient 25 - 49%     Toileting Toileting    Toileting assist Assist for toileting: Dependent -  Patient 0%     Transfers Chair/bed transfer  Transfers assist  Chair/bed transfer activity did not occur: Safety/medical concerns  Chair/bed transfer assist level: 2 Helpers     Locomotion Ambulation   Ambulation assist   Ambulation activity did not occur: Safety/medical concerns          Walk 10 feet activity   Assist  Walk 10 feet activity did not occur: Safety/medical concerns        Walk 50 feet activity   Assist Walk 50 feet with 2 turns activity did not occur: Safety/medical concerns         Walk 150 feet activity   Assist Walk 150 feet  activity did not occur: Safety/medical concerns         Walk 10 feet on uneven surface  activity   Assist Walk 10 feet on uneven surfaces activity did not occur: Safety/medical concerns         Wheelchair     Assist Is the patient using a wheelchair?: Yes Type of Wheelchair: Manual    Wheelchair assist level: Dependent - Patient 0% Max wheelchair distance: >150 feet    Wheelchair 50 feet with 2 turns activity    Assist        Assist Level: Dependent - Patient 0%   Wheelchair 150 feet activity     Assist      Assist Level: Dependent - Patient 0%   Blood pressure 137/76, pulse 77, temperature 98 F (36.7 C), temperature source Oral, resp. rate 18, weight 108.8 kg, SpO2 100 %.  Medical Problem List and Plan: 1. Functional deficits secondary to RIght MCA cardioembolic infarct complicated by prior hx B BKA             -patient may  shower             -ELOS/Goals: MinA mobility and ADL, use R BKA prothetic to transfer to WC ,   -Continue CIR therapies including PT, OT, and SLP  2.  Antithrombotics: -DVT/anticoagulation:  Pharmaceutical: Eliquis             -antiplatelet therapy: N/A 3. Pain Management: Tylenol prn.  4. Mood/Behavior/Sleep: LCSW to follow for evaluation and support             -continue Elavil at bedtime for insomnia.              -antipsychotic agents: N/A 5. Neuropsych/cognition: This patient is capable of making decisions on her own behalf. 6. Skin/Wound Care: I suspect the small lesion on her back is a sebaceous cyst which perhaps bled centrally and subsequently scabbed. -will try warm moist compresses to the area to see if we can open it up  7. Fluids/Electrolytes/Nutrition: Strict I/O. Add ed1200 cc FR/day.             --daily weights.  8. New onset A fib w/RVR: Monitor HR TID continue Eliquis. 9. T2DM: Hgb A1C- 6.0 and well controlled. Was on insulin Glargine 18 units with 5 units TID meal coverage.  CBG (last 3)  Recent Labs     03/31/22 2104 04/01/22 0617 04/01/22 1130  GLUCAP 247* 132* 187*   Borderline control 8/19--observe for pattern today 10. ESRD: HD TTS at the end of the day to help with tolerance of therapy during the day.  Appreciate Nephro assistance - discussed with Nephro, no vascular access issues  11. Small thyroid nodule: Follow up with PCP after discharge for further work up.  12.  Patchy/nodular chest  densities: CT chest recommended for follow-up 13. OSA: Continue BIPAP when asleep.   50.  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  Fall PTA with Left hip and shoulder pain check xrays although exam appears mainly consistent with post CVA shoulder pain as well as Left hip troch bursitis  8/14-mild to moderate OA visible on xrays of left shoulder and pelvis    -add voltaren gel to shoulder and hip pain 16.Dysphagia- adv per SLP. Seems to be tolerating diet well   LOS:  7 days A FACE TO FACE EVALUATION WAS PERFORMED  Meredith Staggers 04/01/2022, 1:33 PM

## 2022-04-01 NOTE — Progress Notes (Addendum)
Physical Therapy Session Note  Patient Details  Name: Cindy Osborne MRN: [id number] Date of Birth: [birth year]  Today's Date: 03/31/2022 PT Individual Time: 1530-1615   45 min   Short Term Goals: Week 1:  PT Short Term Goal 1 (Week 1): Patient will perform bed mobility with min A with our without use of bed rails. PT Short Term Goal 2 (Week 1): Patient will perform basic transfers with mod A >50% of the time. PT Short Term Goal 3 (Week 1): Patient will perform sit to stand with max A using LRAD. PT Short Term Goal 4 (Week 1): Patient will initiate w/c mobility.   Skilled Therapeutic Interventions/Progress Updates:   Pt received sitting in WC and agreeable to PT. Pt performed sit<>stand in stedy with mod assist from PT for safety and cues for adequate weight shift anteriorly. Min assist for safety sitting in stedy to improve midline and prevent lateral lean to the L. Transfer to toilet to attempt BM. Sitting balance on toilet for BM and urination x 10 minutes with supervision assist. Partial sit<>stand x 2 for repositioning with mod assist from PT.   Stedy transfer to bed with mod assist from PT. [place] Bil AFO sitting EOB with total A for safety. Sit>supine with mod assist from PT and max cues for initiation of transfer and attention to the [place]. Pt left supine in bed with call bell in reach and all needs met.        Therapy Documentation Precautions:  Precautions Precautions: Fall, Other (comment) Precaution Comments: L hemiplegia, Bil BKA 2 years ago and 1.5 years ago Required Braces or Orthoses: Other Brace Other Brace: bil prosthetic Restrictions Weight Bearing Restrictions: No    Vital Signs: Therapy Vitals Temp: 98 F (36.7 C) Temp Source: Oral Pulse Rate: 77 Resp: 18 BP: 137/76 Patient Position (if appropriate): Lying Oxygen Therapy SpO2: 100 % O2 Device: Room Air Pain: denies    Therapy/Group: Individual Therapy  Lorie Phenix 04/01/2022, 7:19 AM

## 2022-04-01 NOTE — Progress Notes (Signed)
Speech Language Pathology Daily Session Note  Patient Details  Name: Tobin Cadiente MRN: [id number] Date of Birth: 1966/03/07  Today's Date: 04/01/2022 SLP Individual Time: 0175-1025 SLP Individual Time Calculation (min): 45 min  Short Term Goals: Week 1: SLP Short Term Goal 1 (Week 1): Pt will consume current diet with minimal overt s/sx of aspiration with min A verbal cues to reduce rate of consumption, bite sizes, L pocketing/anterior spillage SLP Short Term Goal 2 (Week 1): Patient will implement speech intelligibility strategies at the sentence level with with min A verbal cues to monitor and self correct SLP Short Term Goal 3 (Week 1): Patient will utilize internal/external memory strategies to recall novel information with min A verbal cues SLP Short Term Goal 4 (Week 1): Patient will participate in further cognitive assessment SLP Short Term Goal 4 - Progress (Week 1): Met SLP Short Term Goal 5 (Week 1): Pt will demonstrate awareness of errors with mod A verbal/visual cues SLP Short Term Goal 6 (Week 1): Pt will complete mildly complex problem solving with mod-to-max A verbal/visual cues  Skilled Therapeutic Interventions: Pt seen for skilled ST with focus on swallowing and cognitive goals, pt in bed asleep but rouses easily and agreeable to AM meal with encouragement. NT assisting with repositioning patient, SLP providing meal set up A as needed. Throughout the meal, pt benefits from min A verbal cues to reduce rate of intake, not talk during eating and check L cheek for pocketed material. Pt with one large choking episode when attempting to speak while eating, stating "wow that was bad", however despite this significant event pt continues to need verbal cues to increase awareness and carryover compensatory strategies across rest of meal. No other s/s aspiration or change in vocal quality with intake of 100% breakfast. Pt with increased independence in alternating liquids and solids and  utilizing liquid wash as needed. Pt also observed consuming whole pills with thin liquids, able to tolerate single pills without difficulty however when multiple pills administered at once pt having to spit them out and take individually. Continue to recommend full supervision at mealtime due to patient's decreased self-monitoring and need for cues to recall and implement swallow precautions. SLP facilitating mildly complex problem solving scenarios for current and home environment by providing overall mod A cues for safety and awareness of how current physical and cognitive deficits will impact daily routine at discharge. Pt left in bed with alarm on and all needs within reach, cont ST POC.   Pain Pain Assessment Pain Scale: 0-10 Pain Score: 0-No pain  Therapy/Group: Individual Therapy  Dewaine Conger 04/01/2022, 8:53 AM

## 2022-04-01 NOTE — Progress Notes (Signed)
  [facility] Progress Note   Subjective:  patient seen on HD.   Objective Vitals:   04/01/22 1430 04/01/22 1500 04/01/22 1530 04/01/22 1600  BP: 94/67 109/65 111/64 107/66  Pulse: 66 67 69 71  Resp: (!) 27 16 (!) 0 17  Temp:      TempSrc:      SpO2: (!) 85% (!) 88% 91% (!) 87%  Weight:       Physical Exam General: NAD Heart: RRR, no murmurs, rubs or gallops Lungs: CTA bilaterally Abdomen: obese, soft, non-distended Extremities: No edema b/l lower extremities Neuro: awake, alert Dialysis Access:  LUE AVF + bruit  OP HD: [facility] ([facility] [phone])   4h  104.3kg  400/600  2/2.5 bath  Heparin 2000 LUA AVF - mircera 150 ug q2, last 8/5, last Hb 9.7 - calcitriol 0.5 ug tiw - sensipar 90 po tiw    Assessment/Plan: Acute CVA - sp intervention 8/8. Was here in June for similar event. Per stroke team. In CIR pAFib - Cardiology on board.  On Toprol 200mg  daily & eliquis Acute resp failure w/ hypoxemia - resolved  ESRD - on HD TTS. HD today.  HTN/Volume - BP acceptable. Metoprolol resumed per cardiology. UF as tolerated Anemia of CKD - Hgb 11.5 8/15.  Esa last given 8/5, no esa needs.  8.   MBD ckd - CCa high, VDRA on hold. Sensipar resumed. Monitor PO4  [name], MD 04/01/2022, 4:19 PM     Recent Labs  Lab 03/27/22 0558 03/28/22 1444  HGB 11.5* 11.5*  ALBUMIN 2.9* 2.8*  CALCIUM 9.4 9.4  PHOS  --  7.7*  CREATININE 7.93* 9.70*  K 4.1 4.2   Inpatient medications:  amitriptyline  25 mg Oral QHS   apixaban  5 mg Oral BID   Chlorhexidine Gluconate Cloth  6 each Topical Q0600   insulin aspart  0-5 Units Subcutaneous QHS   insulin aspart  0-6 Units Subcutaneous TID WC   metoprolol succinate  200 mg Oral Daily   pravastatin  40 mg Oral q1800    acetaminophen, bisacodyl, diphenhydrAMINE, guaiFENesin-dextromethorphan, mouth rinse, polyethylene glycol, prochlorperazine **OR** prochlorperazine **OR** prochlorperazine, traMADol

## 2022-04-01 NOTE — Progress Notes (Signed)
Physical Therapy Session Note  Patient Details  Name: Cindy Osborne MRN: [id number] Date of Birth: Nov 25, 1965  Today's Date: 04/01/2022 PT Individual Time: 0902-0945 PT Individual Time Calculation (min): 43 min   Short Term Goals: Week 1:  PT Short Term Goal 1 (Week 1): Patient will perform bed mobility with min A with our without use of bed rails. PT Short Term Goal 2 (Week 1): Patient will perform basic transfers with mod A >50% of the time. PT Short Term Goal 3 (Week 1): Patient will perform sit to stand with max A using LRAD. PT Short Term Goal 4 (Week 1): Patient will initiate w/c mobility. Week 2:     Skilled Therapeutic Interventions/Progress Updates:  Patient supine in bed on entrance to room. Patient alert and agreeable to PT session.   Patient with no pain complaint throughout session. But does complain of itchiness at posterior L knee/ thigh, and L back. Using washcloth, provided "scratching" to itchy areas and then applied lotion to both areas. Pt happy.  Therapeutic Activity: Bed Mobility: While supine, pt is able to lift BLE to thread into shorts with MinA. Then is able to bring shorts up to buttock level then performing roll to each side in order to allow therapist and aide to assist to complete donning. Patient performed supine --> sit with heavy vc for effort and technique. MinA with pt's Lhand to therapist's hand to assist in reaching seated position. Is able to pivot L hip around to square self on EOB with heavy vc and CGA. Pt requires MaxA to don silicone liner to each LE.   While seated EOB, pt is able to don R prosthetic with setup and L prosthetic with ModA.   Return to supine at end of session with Mod A for BLE and heavy vc for positioning.  Transfers: Pt relates that staff was not able to successfully seat her on [facility] earlier with use of Sara Plus. STEDY brought to room for transfer. Pt counts for momentum build and is able to rise to stand with ModA to L side.  Lean to L.   Following NMR in STEDY, pt brought back to seated position on EOB and is able to descend to sit with CGA and heavy sit to EOB in final 25% of descent.   Neuromuscular Re-ed: NMR facilitated during session with focus on standing balance, proprioception, midline orientation. Pt guided in use of mirror to help assess pt's midline orientation as she was unable to determine side of lean on rise to sit on perch of STEDY. With use of therapist upright and midline stance for reference, pt is able to correct with vc and visual cues in mirror. Progressed to same challenge in standing.  Pt also guided in standing weight shifts in STEDY for pre-gait training. Is able to lift RLE from floor of platform. NMR performed for improvements in motor control and coordination, balance, sequencing, judgement, and self confidence/ efficacy in performing all aspects of mobility at highest level of independence.   Patient supine  in bed at end of session with brakes locked, bed alarm set, and all needs within reach. Pt on phone talking to sister.    Therapy Documentation Precautions:  Precautions Precautions: Fall, Other (comment) Precaution Comments: L hemiplegia, Bil BKA 2 years ago and 1.5 years ago Required Braces or Orthoses: Other Brace Other Brace: bil prosthetic Restrictions Weight Bearing Restrictions: No General:   Vital Signs: Therapy Vitals Temp: (!) 97.5 F (36.4 C) Temp Source: Oral Pulse  Rate: 64 Resp: 15 BP: 117/63 Patient Position (if appropriate): Lying Oxygen Therapy SpO2: 94 % O2 Device: Room Air Pulse Oximetry Type: Continuous Pain: Pain Assessment Pain Scale: 0-10 Pain Score: 0-No pain Pain Type: Chronic pain Pain Location: Hip Pain Orientation: Right;Left Pain Descriptors / Indicators: Aching;Cramping Pain Frequency: Intermittent Pain Onset: On-going Patients Stated Pain Goal: 1 Pain Intervention(s): Medication (See eMAR)   Therapy/Group: Individual  Therapy  Alger Simons PT, DPT, CSRS 04/01/2022, 2:03 PM

## 2022-04-02 LAB — GLUCOSE, CAPILLARY
Glucose-Capillary: 149 mg/dL — ABNORMAL HIGH (ref 70–99)
Glucose-Capillary: 184 mg/dL — ABNORMAL HIGH (ref 70–99)
Glucose-Capillary: 200 mg/dL — ABNORMAL HIGH (ref 70–99)
Glucose-Capillary: 181 mg/dL — ABNORMAL HIGH (ref 70–99)

## 2022-04-02 NOTE — Progress Notes (Signed)
Speech Language Pathology Daily Session Note  Patient Details  Name: Kerrin Markman MRN: [id number] Date of Birth: [birth year]  Today's Date: 04/02/2022 SLP Individual Time: 1100-1200 SLP Individual Time Calculation (min): 60 min  Short Term Goals: Week 1: SLP Short Term Goal 1 (Week 1): Pt will consume current diet with minimal overt s/sx of aspiration with min A verbal cues to reduce rate of consumption, bite sizes, L pocketing/anterior spillage SLP Short Term Goal 2 (Week 1): Patient will implement speech intelligibility strategies at the sentence level with with min A verbal cues to monitor and self correct SLP Short Term Goal 3 (Week 1): Patient will utilize internal/external memory strategies to recall novel information with min A verbal cues SLP Short Term Goal 4 (Week 1): Patient will participate in further cognitive assessment SLP Short Term Goal 4 - Progress (Week 1): Met SLP Short Term Goal 5 (Week 1): Pt will demonstrate awareness of errors with mod A verbal/visual cues SLP Short Term Goal 6 (Week 1): Pt will complete mildly complex problem solving with mod-to-max A verbal/visual cues  Skilled Therapeutic Interventions:  Pt was seen for skilled ST targeting cognitive goals.  Pt was awake and alert upon arrival and agreeable to participating in treatment.  SLP facilitated the session with a novel card game to address problem solving goals.  Pt needed mod faded to min assist verbal cues to plan and execute a problem solving strategy within task and showed good carryover of task rules and procedures.  Pt needed up to min cues to locate cards to the left of midline.  Pt was left in chair with chair alarm set and call bell within reach.  Continue per current plan of care.     Pain Pain Assessment Pain Scale: 0-10 Pain Score: 0-No pain  Therapy/Group: Individual Therapy  PageSelinda Orion 04/02/2022, 4:37 PM

## 2022-04-02 NOTE — Progress Notes (Signed)
Physical Therapy Session Note  Patient Details  Name: Cindy Osborne MRN: [id number] Date of Birth: 30-Jul-1966  Today's Date: 04/02/2022 PT Individual Time: 0900-1000 PT Individual Time Calculation (min): 60 min   Short Term Goals: Week 1:  PT Short Term Goal 1 (Week 1): Patient will perform bed mobility with min A with our without use of bed rails. PT Short Term Goal 2 (Week 1): Patient will perform basic transfers with mod A >50% of the time. PT Short Term Goal 3 (Week 1): Patient will perform sit to stand with max A using LRAD. PT Short Term Goal 4 (Week 1): Patient will initiate w/c mobility.  Skilled Therapeutic Interventions/Progress Updates: Pt presents sitting EOB and agreeable to therapy.  Pt required total A for donning B socks and gel sleeves.  Ply applied to R residual limb and then prosthesis attempted to apply, but unable to to complete.  Ply removed and then prosthesis applied w/ min A, Left prosthesis applied w/ same.  Pt transferred sit to stand w/ Stedy and mod to min A, counting method using B hands on cross bar.  Pt stood and then states need for commode.  Pt transported to commode w/ Stedy and continent of bladder, NT notified.  Pt required total A for pericare in standing at [facility].  Pt transferred to w/c.  Pt wheeled to hallway outside main gym for time conservation.  Pt performed multiple sit to stand at rail.  Pt attempted sit to stand from w/c arm rest but unable.  Pt attempted steps at rail w/ mod A and L arm over PT shoulder (sitting) but c/o discomfort to L shoulder.  Pt continued to perform sit to stand w/ counting and use of rail w/ min occasional mod A.  Pt using mirror for upright posture and symmetrical stance.  Pt able to reach to w/c for improved eccentric sitting.  B prostheses removed and shrinkers applied w/ LE s elevated and remained sitting in w/c w/ chair alarm on and all needs in reach.     Therapy Documentation Precautions:  Precautions Precautions:  Fall, Other (comment) Precaution Comments: L hemiplegia, Bil BKA 2 years ago and 1.5 years ago Required Braces or Orthoses: Other Brace Other Brace: bil prosthetic Restrictions Weight Bearing Restrictions: No General:   Vital Signs:   Pain:no c/o       Therapy/Group: Individual Therapy  Ladoris Gene 04/02/2022, 12:22 PM

## 2022-04-03 LAB — GLUCOSE, CAPILLARY
Glucose-Capillary: 167 mg/dL — ABNORMAL HIGH (ref 70–99)
Glucose-Capillary: 144 mg/dL — ABNORMAL HIGH (ref 70–99)
Glucose-Capillary: 151 mg/dL — ABNORMAL HIGH (ref 70–99)
Glucose-Capillary: 221 mg/dL — ABNORMAL HIGH (ref 70–99)

## 2022-04-03 MED ORDER — CHLORHEXIDINE GLUCONATE CLOTH 2 % EX PADS
6.0000 | MEDICATED_PAD | Freq: Every day | CUTANEOUS | Status: DC
  Administered 2022-04-04 – 2022-04-05 (×2): 6 via TOPICAL

## 2022-04-03 NOTE — Progress Notes (Signed)
PROGRESS NOTE   Subjective/Complaints:  DIscussed CBG elevation, pt did eat cookies yesterday.  Sees Endo as OP.  Used glargine and novalog at home .  Current CBG mildly elevated but is diet controlled   ROS: Patient denies CP, SOB, N/V/D Objective:   No results found. No results for input(s): "WBC", "HGB", "HCT", "PLT" in the last 72 hours. No results for input(s): "NA", "K", "CL", "CO2", "GLUCOSE", "BUN", "CREATININE", "CALCIUM" in the last 72 hours.  Intake/Output Summary (Last 24 hours) at 04/03/2022 0829 Last data filed at 04/02/2022 1700 Gross per 24 hour  Intake 592 ml  Output --  Net 592 ml         Physical Exam: Vital Signs Blood pressure 123/61, pulse 71, temperature 98.2 F (36.8 C), resp. rate 18, weight 102.9 kg, SpO2 98 %.   General: No acute distress Mood and affect are appropriate Heart: Regular rate and rhythm no rubs murmurs or extra sounds Lungs: Clear to auscultation, breathing unlabored, no rales or wheezes Abdomen: Positive bowel sounds, soft nontender to palpation, nondistended Extremities: No clubbing, cyanosis, or edema   Skin: No evidence of breakdown, no evidence of rash. There is a perfectly round 1+cm lesion on her upper back near scapula. It appears to be scabbed over in center, no drainage. Is tender with palpation Neurologic: Cranial nerves II through XII intact, motor strength is 5/5 in right deltoid, bicep, tricep, grip, hip flexor, knee extensors, 1-2/5 left delt, biceps, 2-triceps and finger flexors, 1/5 hip flexors and 3 knee flexors and extensors--no changes today Sensory exam normal sensation to light touch and proprioception in right upper and lower extremities, absent LT sensation LUE, intact LLE Cerebellar exam normal finger to nose to finger as well as heel to shin in bilateral upper and lower extremities Musculoskeletal: no pain with left hip ROM, no tenderness to palpation     Assessment/Plan: 1. Functional deficits which require 3+ hours per day of interdisciplinary therapy in a comprehensive inpatient rehab setting. Physiatrist is providing close team supervision and 24 hour management of active medical problems listed below. Physiatrist and rehab team continue to assess barriers to discharge/monitor patient progress toward functional and medical goals  Care Tool:  Bathing    Body parts bathed by patient: Left arm, Chest, Abdomen, Right upper leg, Face   Body parts bathed by helper: Front perineal area, Buttocks, Right arm Body parts n/a: Left lower leg, Right lower leg   Bathing assist Assist Level: Maximal Assistance - Patient 24 - 49%     Upper Body Dressing/Undressing Upper body dressing   What is the patient wearing?: Pull over shirt    Upper body assist Assist Level: Moderate Assistance - Patient 50 - 74%    Lower Body Dressing/Undressing Lower body dressing      What is the patient wearing?: Pants     Lower body assist Assist for lower body dressing: Maximal Assistance - Patient 25 - 49%     Toileting Toileting    Toileting assist Assist for toileting: Dependent - Patient 0%     Transfers Chair/bed transfer  Transfers assist  Chair/bed transfer activity did not occur: Safety/medical concerns  Chair/bed transfer assist  level: 2 Helpers     Locomotion Ambulation   Ambulation assist   Ambulation activity did not occur: Safety/medical concerns          Walk 10 feet activity   Assist  Walk 10 feet activity did not occur: Safety/medical concerns        Walk 50 feet activity   Assist Walk 50 feet with 2 turns activity did not occur: Safety/medical concerns         Walk 150 feet activity   Assist Walk 150 feet activity did not occur: Safety/medical concerns         Walk 10 feet on uneven surface  activity   Assist Walk 10 feet on uneven surfaces activity did not occur: Safety/medical  concerns         Wheelchair     Assist Is the patient using a wheelchair?: Yes Type of Wheelchair: Manual    Wheelchair assist level: Dependent - Patient 0% Max wheelchair distance: >150 feet    Wheelchair 50 feet with 2 turns activity    Assist        Assist Level: Dependent - Patient 0%   Wheelchair 150 feet activity     Assist      Assist Level: Dependent - Patient 0%   Blood pressure 123/61, pulse 71, temperature 98.2 F (36.8 C), resp. rate 18, weight 102.9 kg, SpO2 98 %.  Medical Problem List and Plan: 1. Functional deficits secondary to RIght MCA cardioembolic infarct complicated by prior hx B BKA             -patient may  shower             -ELOS/Goals: MinA mobility and ADL, use R BKA prothetic to transfer to WC ,   -Continue CIR therapies including PT, OT, and SLP  2.  Antithrombotics: -DVT/anticoagulation:  Pharmaceutical: Eliquis             -antiplatelet therapy: N/A 3. Pain Management: Tylenol prn.  4. Mood/Behavior/Sleep: LCSW to follow for evaluation and support             -continue Elavil at bedtime for insomnia.              -antipsychotic agents: N/A 5. Neuropsych/cognition: This patient is capable of making decisions on her own behalf. 6. Skin/Wound Care: I suspect the small lesion on her back is a sebaceous cyst which perhaps bled centrally and subsequently scabbed. -will try warm moist compresses to the area to see if we can open it up  7. Fluids/Electrolytes/Nutrition: Strict I/O. Add ed1200 cc FR/day.             --daily weights.  8. New onset A fib w/RVR: Monitor HR TID continue Eliquis. 9. T2DM: Hgb A1C- 6.0 and well controlled. Was on insulin Glargine 18 units with 5 units TID meal coverage.  CBG (last 3)  Recent Labs    04/02/22 1630 04/02/22 2108 04/03/22 0646  GLUCAP 200* 181* 167*    Borderline control sees Endo as OP, discussed that she is currently managed with diet only and doing reasonably well, if her diet  doesn't change at home she may be managed with oral agent , 10. ESRD: HD TTS at the end of the day to help with tolerance of therapy during the day.  Appreciate Nephro assistance - discussed with Nephro, no vascular access issues  11. Small thyroid nodule: Follow up with PCP after discharge for further work up.  12.  Patchy/nodular  chest densities: CT chest recommended for follow-up 13. OSA: Continue BIPAP when asleep.   91.  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  Fall PTA with Left hip and shoulder pain check xrays although exam appears mainly consistent with post CVA shoulder pain as well as Left hip troch bursitis  8/14-mild to moderate OA visible on xrays of left shoulder and pelvis    -add voltaren gel to shoulder and hip pain 16.Dysphagia- adv per SLP. Seems to be tolerating diet well   LOS:  9 days A FACE TO FACE EVALUATION WAS PERFORMED  Charlett Blake 04/03/2022, 8:29 AM

## 2022-04-03 NOTE — Progress Notes (Addendum)
  [facility] Progress Note   Subjective:  patient seen in room. No sob or leg swelling. No c/o's. Doing well.   Objective Vitals:   04/02/22 0416 04/02/22 0500 04/02/22 1347 04/03/22 0441  BP: 116/65  116/67 123/61  Pulse: 71  70 71  Resp: 18  17 18   Temp: 97.8 F (36.6 C)  (!) 97.4 F (36.3 C) 98.2 F (36.8 C)  TempSrc:   Oral   SpO2: 100%  100% 98%  Weight:  102.3 kg  102.9 kg   Physical Exam General: NAD Heart: RRR, no murmurs, rubs or gallops Lungs: CTA bilaterally Abdomen: obese, soft, non-distended Extremities: No edema b/l lower extremities Neuro: awake, alert Dialysis Access:  LUE AVF + bruit  OP HD: [facility] ([facility] [phone])   4h  104.3kg  400/600  2/2.5 bath  Heparin 2000 LUA AVF - mircera 150 ug q2, last 8/5, last Hb 9.7 - calcitriol 0.5 ug tiw - sensipar 90 po tiw    Assessment/Plan: Debility - post CVA, on CIR now.  Acute CVA - sp intervention 8/8. Was here in June for similar event. Deficits improving some.  pAFib - seen by cardiology and getting metoprolol 200mg  daily & eliquis. HR 70s Acute resp failure w/ hypoxemia - resolved  ESRD - on HD TTS. HD tomorrow.  HTN/Volume - BPs lower and pt is below dry wt. Metoprolol resumed per cardiology. Will lower UF goal w/ next HD.  Anemia of CKD - Hgb 11.5 8/15.  Esa last given 8/5, no esa needs.  8.   MBD ckd - CCa high and phos is high. VDRA on hold. Sensipar resumed. Monitor PO4  [name], MD 04/03/2022, 8:19 AM     Recent Labs  Lab 03/28/22 1444  HGB 11.5*  ALBUMIN 2.8*  CALCIUM 9.4  PHOS 7.7*  CREATININE 9.70*  K 4.2    Inpatient medications:  amitriptyline  25 mg Oral QHS   apixaban  5 mg Oral BID   Chlorhexidine Gluconate Cloth  6 each Topical Q0600   insulin aspart  0-5 Units Subcutaneous QHS   insulin aspart  0-6 Units Subcutaneous TID WC   metoprolol succinate  200 mg Oral Daily   pravastatin  40 mg Oral q1800    acetaminophen, bisacodyl,  diphenhydrAMINE, guaiFENesin-dextromethorphan, mouth rinse, polyethylene glycol, prochlorperazine **OR** prochlorperazine **OR** prochlorperazine, traMADol

## 2022-04-03 NOTE — Progress Notes (Signed)
Physical Therapy Weekly Progress Note  Patient Details  Name: Cindy Osborne MRN: [id number] Date of Birth: [birth year]  Beginning of progress report period: March 26, 2022 End of progress report period: April 03, 2022  Today's Date: 04/03/2022 PT Individual Time: 0800-0910 PT Individual Time Calculation (min): 70 min   Patient has met 4 of 4 short term goals.  Patient with steady progress this week. Notable improvement in R lower extremity motor control with full AROM against gravity without prosthetic donned. She currently performs bed mobility with min A rolling through R side-lying in a flat bed, slide board transfers with mod A to the R, needs +2 to stabilize w/c to the L due to reduced hip clearance, mod-min A for transfers with the Stedy, and mod A for sit to stand using R upper extremity support. Patient has had difficulty progressing with gait training this week, will focus on initiation of therapeutic gait this week, as well as power wheelchair mobility for functional locomotion. Patient reports she is setting up public transportation for HD at d/c and reports her sister will be assisting her with her mother available for supervision at d/c. Family not present for PT family education session today, will initiate family education closer to d/c.   Patient continues to demonstrate the following deficits decreased cardiorespiratoy endurance, abnormal tone, decreased coordination, and decreased motor planning, decreased midline orientation and decreased attention to left, decreased initiation, decreased attention, decreased problem solving, and delayed processing, and decreased sitting balance, decreased standing balance, decreased postural control, hemiplegia, and decreased balance strategies and therefore will continue to benefit from skilled PT intervention to increase functional independence with mobility.  Patient progressing toward long term goals.  Continue plan of care.  PT Short  Term Goals Week 1:  PT Short Term Goal 1 (Week 1): Patient will perform bed mobility with min A with our without use of bed rails. PT Short Term Goal 1 - Progress (Week 1): Met PT Short Term Goal 2 (Week 1): Patient will perform basic transfers with mod A >50% of the time. PT Short Term Goal 2 - Progress (Week 1): Met PT Short Term Goal 3 (Week 1): Patient will perform sit to stand with max A using LRAD. PT Short Term Goal 3 - Progress (Week 1): Met PT Short Term Goal 4 (Week 1): Patient will initiate w/c mobility. PT Short Term Goal 4 - Progress (Week 1): Met Week 2:  PT Short Term Goal 1 (Week 2): Patient will perform basic transfers with min A using LRAD. PT Short Term Goal 2 (Week 2): Patient will perform sit to stand with mod A using LRAD. PT Short Term Goal 3 (Week 2): Patient will initiate gait training. PT Short Term Goal 4 (Week 2): Patient will propel w/c >50 ft with min A.  Skilled Therapeutic Interventions/Progress Updates:     Patient in bed upon PT arrival. Patient alert and agreeable to PT session. Patient denied pain during session. Patient's mother and sister not present for family education. Patient reports her mother did not know what time to come, called her mother to inform her of family education scheduled from 8am-12pm.   Therapeutic Activity: Bed Mobility: Patient performed supine to/from sit x2 in a flat bed without use of bed rails to simulate home set-up. Provided verbal cues for progressing through R side-lying to use R arm for trunk control, management of L hemi-body, and use of head to assist with momentum to roll and bring her trunk forward to come  to sitting. Donned B prosthetics with max A with patient sitting EOB. MD rounded during donning.  Transfers: Patient attempted slide board transfer to the L with mod A and increased time for motor planning to hemiplegic side. During transfer w/c break malfunctioned and w/c slid away requiring total A to return patient to  side-lying on the bed to prevent patient from falling and mod A to scoot back onto the bed. W/c removed from patient's room and labeled as broken and placed in ortho gym to be addressed prior to being put back into patient use. Retrieved 16x18 w/c and returned to room. Patient attempted slide board transfer to the L a second time with reduced hip clearance over w/c wheel. Opted for [facility] transfer for patient safety and redirected focus on standing for improved functional progression for transfer. Patient performed dependent Stedy transfer bed>w/c, performed sit to/from stand x2 in the [place] with min-mod A and increased time for initiation with facilitation for forward weight shift and L knee extension. Patient able to grip the cross bar with B upper extremities to pull up.   Patient in w/c in the room at end of session with breaks locked and all needs within reach.   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

## 2022-04-03 NOTE — Progress Notes (Signed)
Occupational Therapy Session Note  Patient Details  Name: Cindy Osborne MRN: [id number] Date of Birth: November 08, 1965  Today's Date: 04/03/2022 OT Individual Time: 1105-1205 OT Individual Time Calculation (min): 60 min    Short Term Goals: Week 2:  OT Short Term Goal 1 (Week 2): Patient demonstrated impoved awareness of L UE by positioning prior to transfer with min verbal cues. OT Short Term Goal 2 (Week 2): Patient will recall hemi dressing techniques with min cues OT Short Term Goal 3 (Week 2): Patient will perform one step of toileting task  Skilled Therapeutic Interventions/Progress Updates:    Pt greeted seated in wc with mother and sister present for family education. Pt practiced slidboard transfer to stronger side, but had difficulty intiating scoot. OT had pt try stand-pivot using handrail of bed. She needed mod A to get to standing, but then max A to pivot with difficulty bringing L side around. OT assisted pt back to supine, then demonstrated an AP transfer. Attempted first AP with prosthetics on, then doffed them for the next as prosthetics were heacving and difficult to manage. Pt with good  trunk control and could  get to long sitting and maintain with mod A, but supervision mostly once sitting up. She needed mod A for AP transfer to and from bed, with second person needed to stabilize wc. OT then assisted with donning B prosthetics and educated on use of Stedy for transfer. Talked about purchasing options with sister as well. Sit<>stands in stedy with increased time to motor plan and initiate, mod A. Pt able to stand from [employer] with only supervision. While pt seated in perched stedy position, OT braided hair and used mirror feedback cue pt to midline. Pt returned to wc. OT placed SAEBO e-stim. SAEBO left on for 60 minutes. OT returned to remove SAEBO with skin intact and no adverse reactions.  Saebo Stim One 330 pulse width 35 Hz pulse rate On 8 sec/ off 8 sec Ramp up/ down 2  sec Symmetrical Biphasic wave form  Max intensity 165m at 500 Ohm load  Pt left seated in wc with family present, call bell in reach, and needs met.   Therapy Documentation Precautions:  Precautions Precautions: Fall, Other (comment) Precaution Comments: L hemiplegia, Bil BKA 2 years ago and 1.5 years ago Required Braces or Orthoses: Other Brace Other Brace: bil prosthetic Restrictions Weight Bearing Restrictions: No  Pain:  Denies pain   Therapy/Group: Individual Therapy  EValma Cava8/21/2023, 12:24 PM

## 2022-04-03 NOTE — Progress Notes (Signed)
Speech Language Pathology Weekly Progress and Session Note  Patient Details  Name: Cindy Osborne MRN: [id number] Date of Birth: 1966/01/01  Beginning of progress report period: March 27, 2022 End of progress report period: April 03, 2022  Today's Date: 04/03/2022 SLP Individual Time: 1950-9326 SLP Individual Time Calculation (min): 46 min  Short Term Goals: Week 1: SLP Short Term Goal 1 (Week 1): Pt will consume current diet with minimal overt s/sx of aspiration with min A verbal cues to reduce rate of consumption, bite sizes, L pocketing/anterior spillage SLP Short Term Goal 1 - Progress (Week 1): Met SLP Short Term Goal 2 (Week 1): Patient will implement speech intelligibility strategies at the sentence level with with min A verbal cues to monitor and self correct SLP Short Term Goal 2 - Progress (Week 1): Met SLP Short Term Goal 3 (Week 1): Patient will utilize internal/external memory strategies to recall novel information with min A verbal cues SLP Short Term Goal 3 - Progress (Week 1): Not met SLP Short Term Goal 4 (Week 1): Patient will participate in further cognitive assessment SLP Short Term Goal 4 - Progress (Week 1): Met SLP Short Term Goal 5 (Week 1): Pt will demonstrate awareness of errors with mod A verbal/visual cues SLP Short Term Goal 5 - Progress (Week 1): Met SLP Short Term Goal 6 (Week 1): Pt will complete mildly complex problem solving with mod-to-max A verbal/visual cues SLP Short Term Goal 6 - Progress (Week 1): Met  New Short Term Goals: Week 2: SLP Short Term Goal 1 (Week 2): Pt will consume current diet with minimal overt s/sx of aspiration with sup A verbal cues to reduce rate of consumption, bite sizes, L pocketing/anterior spillage SLP Short Term Goal 2 (Week 2): Patient will implement speech intelligibility strategies at the sentence level with with sup A verbal cues to monitor and self correct SLP Short Term Goal 3 (Week 2): Patient will utilize  internal/external memory strategies to recall novel information with min A verbal cues SLP Short Term Goal 4 (Week 2): Pt will demonstrate awareness of errors with min-to-mod A verbal/visual cues SLP Short Term Goal 5 (Week 2): Pt will complete mildly complex problem solving with min-to-mod A verbal/visual cues  Weekly Progress Updates:  Pt has demonstrated functional gains, as evident by meeting 5 out of 6 short-term goals this reporting period. Pt appears to be has been limited by fatigue, as evidenced by frequently falling asleep during sessions. Pt is currently completing cognitive tasks with mod A verbal/visual cues in regards to attention, memory, problem solving, visual scanning, and error awareness. Pt is currently consuming a dysphagia 3 diet with thin liquids and min A verbal cues to implement safe swallowing precautions and strategies. Pt exhibits decreased awareness of anterior spillage, pocketing, rate of consumption, and bite sizes. Pt requires mod I-to-sup A verbal cues to implement speech intelligibility strategies at the conversation level and is generally perceived as >90% intelligible and able to communicate functional needs effectively. Pt and family education ongoing. Continue to recommend ST intervention during CIR admission, as well as ST f/u upon d/c. Recommend 24/7 supervision and assistance at time of discharge.     Intensity: Minumum of 1-2 x/day, 30 to 90 minutes Frequency: 3 to 5 out of 7 days Duration/Length of Stay: 9/4 Treatment/Interventions: Cognitive remediation/compensation;Speech/Language facilitation;Dysphagia/aspiration precaution training;Functional tasks;Patient/family education;Therapeutic Activities;Internal/external aids  Daily Session Skilled Therapeutic Interventions: Skilled ST treatment focused on cognitive goals. Pt was scheduled for family education session, however family did not  arrive until the last 5 minutes of session. SLP provided brief education on  cognitive-communication changes, oropharyngeal swallow function, diet recommendations, and safe swallowing precautions/strategies. SLP provided family with handouts on cognitive-communication disorder and dysphagia 3 diet considerations. Family may benefit from additional education prior to discharge depending on pt's discharge disposition.   Pt consumed morning meal min A verbal cues for implementation of safe swallowing strategies including slow rate, small bite sizes, and monitoring + managing left anterior spillage and pocketing. Pt exhibited decreased awareness of extent of pocketing and anterior spillage and was encouraged to utilize oral sponge to clear as lingual sweep wasn't completely effective. Pt exhibited delayed cough response x2 during meal and required verbal cues to minimize additional consumption until cough episode had fully resolved. Recommend continuation of dysphagia 3 diet with thin liquids and full supervision d/t decreased self monitoring and impulsivity with rate of intake at times.   SLP facilitated mod A verbal cues for functional time and money problem solving scenarios using the "solving daily math problems" subtest via the ALFA. Without clinician support, pt obtained 6/10 accuracy. Pt required min A verbal cues for error awareness and correction of reading errors attributed to visual scanning deficits.   Patient was left in wheelchair with alarm activated and immediate needs within reach at end of session. Continue per current plan of care.       General    Pain  None/denied  Therapy/Group: Individual Therapy  Patty Sermons 04/03/2022, 12:11 PM

## 2022-04-03 NOTE — Progress Notes (Signed)
Physical Therapy Session Note  Patient Details  Name: Cindy Osborne MRN: [id number] Date of Birth: 12-Oct-1965  Today's Date: 04/03/2022 PT Individual Time: 1450-1532 PT Individual Time Calculation (min): 42 min   Short Term Goals: Week 2:  PT Short Term Goal 1 (Week 2): Patient will perform basic transfers with min A using LRAD. PT Short Term Goal 2 (Week 2): Patient will perform sit to stand with mod A using LRAD. PT Short Term Goal 3 (Week 2): Patient will initiate gait training. PT Short Term Goal 4 (Week 2): Patient will propel w/c >50 ft with min A.  Skilled Therapeutic Interventions/Progress Updates:     Pt received seated in WC and agrees to therapy. Reports phantom pain in both legs. Number not provided. PT provides rest breaks as needed to manage pain. Pt's family present for family education and to observe pt's functional mobility. WC transport to gym for time management. PT assists pt to lock R prosthesis into place as pt had it unattached to start session. Pt performs gait training along hall rail with mirror for visual feedback. Pt performs sit to stand with R hand rail in hallway with modA and cues for hand placement, body mechanics, and sequencing. Pt attempts ambulation but buckles through L leg, requiring urgent sit back in WC. PT provides pt with RW and pt performs sit to stand again with modA. Pt ambulates multiple bouts with RW and modA +1 and +2 for close WC follow for safety. PT provides verbal and manual facilitation of lateral weight shifting, tactile cueing/light blocking of L knee during stance phase to prevent buckling, and manual assistance 50% of time to progress L foot during swing phase. Pt ambulates bouts of 5', 6', 8', and 10' with extended seated rest breaks between each bout. WC transport back to room. Stand pivot back to bed with modA and no AD. Sit to supine with minA. Left with alarm intact and all needs within reach.  Therapy Documentation Precautions:   Precautions Precautions: Fall, Other (comment) Precaution Comments: L hemiplegia, Bil BKA 2 years ago and 1.5 years ago Required Braces or Orthoses: Other Brace Other Brace: bil prosthetic Restrictions Weight Bearing Restrictions: No  Therapy/Group: Individual Therapy  Breck Coons, PT, DPT 04/03/2022, 4:54 PM

## 2022-04-04 LAB — RENAL FUNCTION PANEL
Albumin: 3.1 g/dL — ABNORMAL LOW (ref 3.5–5.0)
Anion gap: 15 (ref 5–15)
BUN: 65 mg/dL — ABNORMAL HIGH (ref 6–20)
CO2: 23 mmol/L (ref 22–32)
Calcium: 9.6 mg/dL (ref 8.9–10.3)
Chloride: 92 mmol/L — ABNORMAL LOW (ref 98–111)
Creatinine, Ser: 9.24 mg/dL — ABNORMAL HIGH (ref 0.44–1.00)
GFR, Estimated: 5 mL/min — ABNORMAL LOW (ref >60)
Glucose, Bld: 208 mg/dL — ABNORMAL HIGH (ref 70–99)
Phosphorus: 7 mg/dL — ABNORMAL HIGH (ref 2.5–4.6)
Potassium: 4.1 mmol/L (ref 3.5–5.1)
Sodium: 130 mmol/L — ABNORMAL LOW (ref 135–145)

## 2022-04-04 LAB — CBC
HCT: 34.6 % — ABNORMAL LOW (ref 36.0–46.0)
Hemoglobin: 11.2 g/dL — ABNORMAL LOW (ref 12.0–15.0)
MCH: 27.8 pg (ref 26.0–34.0)
MCHC: 32.4 g/dL (ref 30.0–36.0)
MCV: 85.9 fL (ref 80.0–100.0)
Platelets: 263 10*3/uL (ref 150–400)
RBC: 4.03 MIL/uL (ref 3.87–5.11)
RDW: 17.2 % — ABNORMAL HIGH (ref 11.5–15.5)
WBC: 7.1 10*3/uL (ref 4.0–10.5)
nRBC: 0 % (ref 0.0–0.2)

## 2022-04-04 LAB — GLUCOSE, CAPILLARY
Glucose-Capillary: 143 mg/dL — ABNORMAL HIGH (ref 70–99)
Glucose-Capillary: 171 mg/dL — ABNORMAL HIGH (ref 70–99)
Glucose-Capillary: 131 mg/dL — ABNORMAL HIGH (ref 70–99)
Glucose-Capillary: 136 mg/dL — ABNORMAL HIGH (ref 70–99)

## 2022-04-04 MED ORDER — HEPARIN SODIUM (PORCINE) 1000 UNIT/ML IJ SOLN: 2000.0000 [IU] | Freq: Once | INTRAMUSCULAR | Status: DC

## 2022-04-04 MED ORDER — PENTAFLUOROPROP-TETRAFLUOROETH EX AERO
INHALATION_SPRAY | CUTANEOUS | Status: AC
  Filled 2022-04-04: qty 30

## 2022-04-04 NOTE — Procedures (Signed)
   I was present at this dialysis session, have reviewed the session itself and made  appropriate changes [name] MD [place] pager [phone]   04/04/2022, 4:51 PM

## 2022-04-04 NOTE — Progress Notes (Signed)
Received patient in bed to unit.  Alert and oriented.  Informed consent signed and in chart.   Treatment initiated: 1342 Treatment completed: 1700  Patient tolerated well.  Transported back to the room  Alert, without acute distress.  Hand-off given to patient's nurse.   Access used: AVG Access issues: none  Total UF removed: 1L Medication(s) given: none Post HD VS: 113/69 71 97% RA 22 98.4 Post HD weight: 106.9KG   Cindy Osborne [facility]

## 2022-04-04 NOTE — Progress Notes (Signed)
Speech Language Pathology Daily Session Note  Patient Details  Name: Cindy Osborne MRN: [id number] Date of Birth: 04-23-1966  Today's Date: 04/04/2022 SLP Individual Time: 1120-1205 SLP Individual Time Calculation (min): 45 min  Short Term Goals: Week 2: SLP Short Term Goal 1 (Week 2): Pt will consume current diet with minimal overt s/sx of aspiration with sup A verbal cues to reduce rate of consumption, bite sizes, L pocketing/anterior spillage SLP Short Term Goal 2 (Week 2): Patient will implement speech intelligibility strategies at the sentence level with with sup A verbal cues to monitor and self correct SLP Short Term Goal 3 (Week 2): Patient will utilize internal/external memory strategies to recall novel information with min A verbal cues SLP Short Term Goal 4 (Week 2): Pt will demonstrate awareness of errors with min-to-mod A verbal/visual cues SLP Short Term Goal 5 (Week 2): Pt will complete mildly complex problem solving with min-to-mod A verbal/visual cues  Skilled Therapeutic Interventions: Skilled ST treatment focused on cognitive goals. Pt was received semi-reclined in bed on arrival, having just completed OT session. Pt recalled slide board techniques as previously performed with OT with mod A verbal cues.   SLP facilitated functional discussion re: intellectual awareness in which pt continues to exhibit minimal awareness of current deficits which impacts her ability to effectively problem solve and anticipate needs.   SLP initially provided min A verbal cues progressing to total A multimodal cues for identification of organization errors in daily BID pillbox, organizational strategies to minimize risk for error, use of external memory aids, self monitoring/self correction, visual scanning, and sustained attention skills for >1 minute duration. Pt demonstrated likely signs of cognitive fatigue and limited sustained attention as task progressed and benefited from brief 1-2 minute  breaks throughout session.   Patient was left in bed with alarm activated and immediate needs within reach at end of session. Continue per current plan of care.       Pain Pain Assessment Pain Scale: 0-10 Pain Score: 0-No pain  Therapy/Group: Individual Therapy  Patty Sermons 04/04/2022, 11:25 AM

## 2022-04-04 NOTE — Progress Notes (Signed)
Occupational Therapy Session Note  Patient Details  Name: Cindy Osborne MRN: [id number] Date of Birth: Mar 06, 1966  Today's Date: 04/04/2022 OT Individual Time: 1005-1120 OT Individual Time Calculation (min): 75 min    Short Term Goals: Week 2:  OT Short Term Goal 1 (Week 2): Patient demonstrated impoved awareness of L UE by positioning prior to transfer with min verbal cues. OT Short Term Goal 2 (Week 2): Patient will recall hemi dressing techniques with min cues OT Short Term Goal 3 (Week 2): Patient will perform one step of toileting task  Skilled Therapeutic Interventions/Progress Updates:  Pt awake up in w/c upon OT arrival to the room. Pt reports, "I got a new toy." Pt in agreement for OT session.  Therapy Documentation Precautions:  Precautions Precautions: Fall, Other (comment) Precaution Comments: L hemiplegia, Bil BKA 2 years ago and 1.5 years ago Required Braces or Orthoses: Other Brace Other Brace: bil prosthetic Restrictions Weight Bearing Restrictions: No Vital Signs: Please see "Flowsheet" for most recent vitals charted by nursing staff.  Pain: Pain Assessment Pain Scale: 0-10 Pain Score: 0-No pain  ADL: Pt declines need to perform ADL's at this time.   W/C Management: Education provided to pt on features on PWC. Education and skilled demo on how to manage tilt feature on w/c and importance of using this feature for pressure relief. Educated pt on pressure relief recommendations while seated in [place]. Recommendation provided to pt to use tilt feature for 5 minutes every 3 minutes. Pt verbalizes understanding, however, will benefit from further education and practice with this. Pt then participates in using joystick with RUE to navigate w/c from room <> therapy gym with moderate assistance due to decreased L attention as pt tends to drift to the L side frequently. However, pt demo's improved awareness when therapist walks on L side and then therapist is able to  instruct pt to navigate more to R. Pt only presents with minimal collisions on the L side, however, able to redirect with VC's.   Transfer Training: Pt participates in transfer training to improve independence with functional transfers. Pt able to complete 2 slide board transfers to the edge of the mat and 1 slide board transfer from w/c > EOB. Pt able to complete all 3 transfers to the L with moderate moderate - maximal assistance to perform safely. Once seated at EOB, pt able to transition from sitting at EOB > supine with moderate assistance for safety.   LUE Neuro Re-Education: Pt participates in NMES paired with tasks to improve motor control on LUE while seated at edge of the mat. OT placed Saebo One on forearm of LUE and encouraged pt to have improved attention to the LUE during "on cycles" with pt to encourage pt to perform AROM with wrist extension during "on cycles" and then to perform gross grasp squeezing during "off cycles". Pt able to tolerate NMES using Saebo One on L forearm for 10 minutes with the following parameters:  Saebo Stim One 330 pulse width 35 Hz pulse rate On 8 sec/ off 8 sec Ramp up/ down 2 sec Symmetrical Biphasic wave form  Max intensity 124mA at 500 Ohm load  Pt's skin checked before & after with no skin integrity issues noted.     Pt returned to bed at end of session. Pt left resting comfortably in bed with personal belongings and call light within reach, bed alarm on and activated, be din low position, 3 bed rails up, and comfort needs attended to.  Therapy/Group: Individual Therapy  Barbee Shropshire 04/04/2022, 12:43 PM

## 2022-04-04 NOTE — Progress Notes (Signed)
Physical Therapy Session Note  Patient Details  Name: Cindy Osborne MRN: [id number] Date of Birth: [birth year]  Today's Date: 04/04/2022 PT Individual Time: 0845-1000 PT Individual Time Calculation (min): 75 min   Short Term Goals: Week 2:  PT Short Term Goal 1 (Week 2): Patient will perform basic transfers with min A using LRAD. PT Short Term Goal 2 (Week 2): Patient will perform sit to stand with mod A using LRAD. PT Short Term Goal 3 (Week 2): Patient will initiate gait training. PT Short Term Goal 4 (Week 2): Patient will propel w/c >50 ft with min A.  Skilled Therapeutic Interventions/Progress Updates:     Patient in bed on the phone upon PT arrival. Patient alert and agreeable to PT session. Patient reported 4-5/10 L knee and hip pain during session, RN made aware. Patient reports this pain started following gait training yesterday with PT. Patient agreeable to trial Lite Gait with a harness for body weight support to protect joints for gait training. PT provided repositioning, rest breaks, and distraction as pain interventions throughout session.   Cindy Osborne, RT, joined for co-treat during session. Focused on self care, self advocacy with staff, dressing, and initiated power w/c mobility during session.   Therapeutic Activity: Bed Mobility: Patient performed rolling with supervision-min A with use of bed rails for peri-care, reported she did not feel clean from last BM in morning. Performed peri-care with set-up assist initially then total A for thoroughness. Patient performed supine to sit with min A following x2 trials. Provided verbal cues for progressing through R side-lying to use R arm for trunk control, management of L hemi-body, and use of head to assist with momentum to roll and bring her trunk forward to come to sitting. Patient doffed a gown and donned paper scrub top and pants cut into shorts with mod-max A sitting EOB. Performed lateral leans with min A-CGA to pull up shorts.  Donned B prosthetics with max A, noted increased push through R lower extremity to secure liner into the socket. Transfers: Patient performed dependent Stedy transfer bed>w/c, performed sit to/from stand x2 in the [facility] with min-mod A and increased time for initiation with facilitation for forward weight shift and L knee extension. Patient unable to grip the cross bar with L upper extremity to pull up today.   Wheelchair Mobility:  Patient propelled a power wheelchair >400 feet with min A to initiate then supervision with max cues for steering due to veering L with L inattention, improved with visual and verbal cuing and cues to stop with increased environmental distraction due to decreased attention to task.  Patient in power w/c at end of session with breaks locked, seat belt secured, and all needs within reach. Handed off to Cindy Osborne, OT at end of session due to patient's poor safety awareness with power w/c (turned it back on and attempted to move withitn the  room independently). Educated patient on safety concerns with w/c mobility without supervision due to L inattention, patient stated understanding, will need reinforcement. OT in agreement to get patient back to bed or to the manual w/c at end of session for patient safety.   Therapy Documentation Precautions:  Precautions Precautions: Fall, Other (comment) Precaution Comments: L hemiplegia, Bil BKA 2 years ago and 1.5 years ago Required Braces or Orthoses: Other Brace Other Brace: bil prosthetic Restrictions Weight Bearing Restrictions: No    Therapy/Group: Individual Therapy  Cindy Osborne PT, DPT, NCS, CBIS  04/04/2022, 7:49 PM

## 2022-04-04 NOTE — Progress Notes (Signed)
PROGRESS NOTE   Subjective/Complaints:  Used estim to LUE yesterday Also discussed DM management , specifically oral agent Tradjenta, given isolated elevations around 5p  ROS: Patient denies CP, SOB, N/V/D Objective:   No results found. No results for input(s): "WBC", "HGB", "HCT", "PLT" in the last 72 hours. No results for input(s): "NA", "K", "CL", "CO2", "GLUCOSE", "BUN", "CREATININE", "CALCIUM" in the last 72 hours. No intake or output data in the 24 hours ending 04/04/22 0823       Physical Exam: Vital Signs Blood pressure (!) 155/73, pulse 74, temperature 97.9 F (36.6 C), resp. rate 16, weight 106.4 kg, SpO2 98 %.   General: No acute distress Mood and affect are appropriate Heart: Regular rate and rhythm no rubs murmurs or extra sounds Lungs: Clear to auscultation, breathing unlabored, no rales or wheezes Abdomen: Positive bowel sounds, soft nontender to palpation, nondistended Extremities: No clubbing, cyanosis, or edema   Skin: No evidence of breakdown, no evidence of rash. There is a perfectly round 1+cm lesion on her upper back near scapula. It appears to be scabbed over in center, no drainage. Is tender with palpation Neurologic: Cranial nerves II through XII intact, motor strength is 5/5 in right deltoid, bicep, tricep, grip, hip flexor, knee extensors, 1-2/5 left delt, biceps, 2-triceps and finger flexors, 1/5 hip flexors and 3 knee flexors and extensors--no changes today Sensory exam normal sensation to light touch and proprioception in right upper and lower extremities, absent LT sensation LUE, intact LLE Cerebellar exam normal finger to nose to finger as well as heel to shin in bilateral upper and lower extremities Musculoskeletal: no pain with left hip ROM, no tenderness to palpation    Assessment/Plan: 1. Functional deficits which require 3+ hours per day of interdisciplinary therapy in a  comprehensive inpatient rehab setting. Physiatrist is providing close team supervision and 24 hour management of active medical problems listed below. Physiatrist and rehab team continue to assess barriers to discharge/monitor patient progress toward functional and medical goals  Care Tool:  Bathing    Body parts bathed by patient: Left arm, Chest, Abdomen, Right upper leg, Face   Body parts bathed by helper: Front perineal area, Buttocks, Right arm Body parts n/a: Left lower leg, Right lower leg   Bathing assist Assist Level: Maximal Assistance - Patient 24 - 49%     Upper Body Dressing/Undressing Upper body dressing   What is the patient wearing?: Pull over shirt    Upper body assist Assist Level: Moderate Assistance - Patient 50 - 74%    Lower Body Dressing/Undressing Lower body dressing      What is the patient wearing?: Pants     Lower body assist Assist for lower body dressing: Maximal Assistance - Patient 25 - 49%     Toileting Toileting    Toileting assist Assist for toileting: Dependent - Patient 0%     Transfers Chair/bed transfer  Transfers assist  Chair/bed transfer activity did not occur: Safety/medical concerns  Chair/bed transfer assist level: 2 Helpers     Locomotion Ambulation   Ambulation assist   Ambulation activity did not occur: Safety/medical concerns  Walk 10 feet activity   Assist  Walk 10 feet activity did not occur: Safety/medical concerns        Walk 50 feet activity   Assist Walk 50 feet with 2 turns activity did not occur: Safety/medical concerns         Walk 150 feet activity   Assist Walk 150 feet activity did not occur: Safety/medical concerns         Walk 10 feet on uneven surface  activity   Assist Walk 10 feet on uneven surfaces activity did not occur: Safety/medical concerns         Wheelchair     Assist Is the patient using a wheelchair?: Yes Type of Wheelchair: Manual     Wheelchair assist level: Dependent - Patient 0% Max wheelchair distance: >150 feet    Wheelchair 50 feet with 2 turns activity    Assist        Assist Level: Dependent - Patient 0%   Wheelchair 150 feet activity     Assist      Assist Level: Dependent - Patient 0%   Blood pressure (!) 155/73, pulse 74, temperature 97.9 F (36.6 C), resp. rate 16, weight 106.4 kg, SpO2 98 %.  Medical Problem List and Plan: 1. Functional deficits secondary to RIght MCA cardioembolic infarct complicated by prior hx B BKA             -patient may  shower             -ELOS/Goals: MinA mobility and ADL, use R BKA prothetic to transfer to WC ,   -Continue CIR therapies including PT, OT, and SLP  2.  Antithrombotics: -DVT/anticoagulation:  Pharmaceutical: Eliquis             -antiplatelet therapy: N/A 3. Pain Management: Tylenol prn.  4. Mood/Behavior/Sleep: LCSW to follow for evaluation and support             -continue Elavil at bedtime for insomnia.              -antipsychotic agents: N/A 5. Neuropsych/cognition: This patient is capable of making decisions on her own behalf. 6. Skin/Wound Care: I suspect the small lesion on her back is a sebaceous cyst which perhaps bled centrally and subsequently scabbed. -will try warm moist compresses to the area to see if we can open it up  7. Fluids/Electrolytes/Nutrition: Strict I/O. Add ed1200 cc FR/day.             --daily weights.  8. New onset A fib w/RVR: Monitor HR TID continue Eliquis. 9. T2DM: Hgb A1C- 6.0 and well controlled. Was on insulin Glargine 18 units with 5 units TID meal coverage.  CBG (last 3)  Recent Labs    04/03/22 1650 04/03/22 2109 04/04/22 0604  GLUCAP 151* 221* 143*    Borderline control sees Endo as OP, discussed that she is currently managed with diet only and doing reasonably well, if her diet doesn't change at home she may be managed with oral agent ,Tradjenta may be tried in hospital , pt would like to think  about it  10. ESRD: HD TTS at the end of the day to help with tolerance of therapy during the day.  Appreciate Nephro assistance - discussed with Nephro, no vascular access issues  11. Small thyroid nodule: Follow up with PCP after discharge for further work up.  12.  Patchy/nodular chest densities: CT chest recommended for follow-up 13. OSA: Continue BIPAP when asleep.   14.  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  Fall PTA with Left hip and shoulder pain check xrays although exam appears mainly consistent with post CVA shoulder pain as well as Left hip troch bursitis  8/14-mild to moderate OA visible on xrays of left shoulder and pelvis    -add voltaren gel to shoulder and hip pain 16.Dysphagia- adv per SLP. Seems to be tolerating diet well   LOS:  10 days A FACE TO FACE EVALUATION WAS PERFORMED  Charlett Blake 04/04/2022, 8:23 AM

## 2022-04-05 DIAGNOSIS — L8990 Pressure ulcer of unspecified site, unspecified stage: Secondary | ICD-10-CM | POA: Insufficient documentation

## 2022-04-05 LAB — GLUCOSE, CAPILLARY
Glucose-Capillary: 132 mg/dL — ABNORMAL HIGH (ref 70–99)
Glucose-Capillary: 186 mg/dL — ABNORMAL HIGH (ref 70–99)
Glucose-Capillary: 161 mg/dL — ABNORMAL HIGH (ref 70–99)
Glucose-Capillary: 234 mg/dL — ABNORMAL HIGH (ref 70–99)

## 2022-04-05 MED ORDER — CHLORHEXIDINE GLUCONATE CLOTH 2 % EX PADS
6.0000 | MEDICATED_PAD | Freq: Every day | CUTANEOUS | Status: DC
Start: 2022-04-06 — End: 2022-04-06

## 2022-04-05 NOTE — Progress Notes (Addendum)
Patient ID: Cindy Osborne, female   DOB: 05-25-1966, 56 y.o.   MRN: [id number]  This SW covering for primary SW, Erlene Quan.   SW spoke with pt sister Cindy Osborne and [name] to provide updates from team conference, and d/c remains 9/4. SW discussed SNF placement based on pt care needs and limited support. Pt sister Cindy Osborne has mentioned quitting her job (words Tues, Wed,Thurs) to help care for her sister. SW reiterated that pt will require transportation to/from dialysis. Pt sister Cindy Osborne and niece intend to speak to pt this evening to discuss discharge plan.   *SW met with pt in room to provide updates from team conference. Pt states she is considering SNF. Intends to speak with her sister and mother who will be here this evening as she does not want her sister to quit her job. SW informed will follow-up with her tomorrow.   Loralee Pacas, MSW, [place] Office: [phone] Cell: [phone] Fax: [phone]

## 2022-04-05 NOTE — Patient Care Conference (Signed)
Inpatient RehabilitationTeam Conference and Plan of Care Update Date: 04/05/2022   Time: 10:15 AM    Patient Name: Cindy Osborne      Medical Record Number: [id number]  Date of Birth: [birth year] Sex: Female         Room/Bed: 4W17C/4W17C-01 Payor Info: Payor: MEDICARE / Plan: MEDICARE PART A AND B / Product Type: *No Product type* /    Admit Date/Time:  03/25/2022  1:40 PM  Primary Diagnosis:  Acute ischemic right MCA stroke [facility])  Hospital Problems: Principal Problem:   Acute ischemic right MCA stroke [facility]) Active Problems:   Pressure injury of skin    Expected Discharge Date: Expected Discharge Date: 04/17/22 (SNF pending)  Team Members Present: Physician leading conference: Dr. Alysia Penna Social Worker Present: Loralee Pacas, [place] Nurse Present: Dorien Chihuahua, RN PT Present: Barrie Folk, PT OT Present: Willeen Cass, OT SLP Present: Sherren Kerns, SLP PPS Coordinator present : Gunnar Fusi, SLP     Current Status/Progress Goal Weekly Team Focus  Bowel/Bladder   Continent x2. Last BM 8/18.  Maintain regular bowel movement.  Monitor for changes and bowel and bladder function.   Swallow/Nutrition/ Hydration   dys 3 with thin liquids; min A with impulsiveness and poor self monitoring skills  sup A  diet tolerance with implementation of swallowing precautions   ADL's   max assist with LB dressing/bathing, max - total assist with toileting, moderate - maximal assist for UB dressing, bathing, moderate - maximal assistance for transfers  CGA overall (min to mod A with LB self care may be more realistic) - may need to consider downgrading goals  transfer training, LUE neuro re-education, pt & family education   Mobility   Min A bed mobility, min-mod A Stedy transfers (patient looking to purchase Stedy for d/c), mod A sit to stand with R rail, gait 10 feet R rail and w/c follow, initiated power w/c mobility, will require significant practice due to L neglect  Min A  overall, supervision w/c mobility>100 ft (no gait goals pending progress)  Sitting and standing balance, functional transfers, activity tolerance, L hemi-body motor control, midline orientation, gait training in Lite Gait to mitegate joint pain due to hemiplegia, power w/c mobility, patient caregiver education   Communication   sup-to-min A  sup A  speech intelligiblity strategies   Safety/Cognition/ Behavioral Observations  mod-to-max A - appears to fluctuate with fatigue and attention  min-to-mod A basic level  attention, memory, problem solving, intellectual awareness.   Pain   Denies pain.  Pain of 0  Assess pain q shift and prn   Skin   Skin is dry and intact.  No skin breakdown.  Assess skin q shift and prn.     Discharge Planning:  Patient discharging home with mother (supervision only). Sister able to provide some PRN assistance (works Education officer, community) Counsellor. HC resources provided to family & family requesting HH.   Team Discussion: Patient with left neglect, impulsive, right knee pain and edema left UE. Also note intellectual awareness deficits, memory issues and poor awareness of pocketing with meals; full supervision, poor midline orientation and pusher syndrome post right MCA CVA.  Patient on target to meet rehab goals: no, progression limited by pain, fatigue and fluctuating cognition. Currently needs max assist for lower body care and toileting. Needs mod - max assist for upper body care and transfers. Able to complete gait training at the railing but anticipate will be w/c level at discharge.  Needs min - mod  assist for PT; mod assist for slide-board to/from the w/c. Goals for discharge set for mod assist transfers and min assist using a slide-board.   *See Care Plan and progress notes for long and short-term goals.   Revisions to Treatment Plan:  E-stim on shoulder Downgraded OT and SLP goals   Teaching Needs: Safety, transfers, toileting, medications,  dietary modifications, self monitoring, etc  Current Barriers to Discharge: Decreased caregiver support and Hemodialysis  Possible Resolutions to Barriers: Family education SNF recommended     Medical Summary Current Status: Left hip and shoulder pain improving, end-stage renal disease managed by nephrology, wearing bilateral BKA with good fit.  Barriers to Discharge: Medical stability;Hemodialysis   Possible Resolutions to [employer] Focus: Working on diabetic control, will need 24/7 min assist level care post discharge, sister lives nearby mother lives further, nephrology to assist with hemodialysis management.   Continued Need for Acute Rehabilitation Level of Care: The patient requires daily medical management by a physician with specialized training in physical medicine and rehabilitation for the following reasons: Direction of a multidisciplinary physical rehabilitation program to maximize functional independence : Yes Medical management of patient stability for increased activity during participation in an intensive rehabilitation regime.: Yes Analysis of laboratory values and/or radiology reports with any subsequent need for medication adjustment and/or medical intervention. : Yes   I attest that I was present, lead the team conference, and concur with the assessment and plan of the team.   Margarito Liner 04/05/2022, 4:37 PM

## 2022-04-05 NOTE — Progress Notes (Signed)
New wound charted in error wound located on inner surface of buttock not over ischial spine see wound image attached to this note. LDA for stage 2 pressure injury removed and non pressure wound added with history gathered from bedside nurse that patient performed perineal care with a wash cloth 2 WTA trained nurses assessed wound as an abrasion will call for orders on treatment. Q2 turns, and dietary consult and patient education on proper, safe perineal care completed.

## 2022-04-05 NOTE — Progress Notes (Signed)
Patient asked for wet washcloth couple of times during this shift. Per patient, she uses it to wipe her bottom. RN educated patient to wipe gently to prevent irritation to her skin. Foam dressing to buttocks in place. Stage 2 left buttock area.

## 2022-04-05 NOTE — Progress Notes (Signed)
Physical Therapy Session Note  Patient Details  Name: Cindy Osborne MRN: [id number] Date of Birth: 1965/09/29  Today's Date: 04/05/2022 PT Individual Time: 9292-4462 PT Individual Time Calculation (min): 27 min   Short Term Goals: Week 2:  PT Short Term Goal 1 (Week 2): Patient will perform basic transfers with min A using LRAD. PT Short Term Goal 2 (Week 2): Patient will perform sit to stand with mod A using LRAD. PT Short Term Goal 3 (Week 2): Patient will initiate gait training. PT Short Term Goal 4 (Week 2): Patient will propel w/c >50 ft with min A.  Skilled Therapeutic Interventions/Progress Updates:     Pt received seated in power WC and agrees to therapy. No complaint of pain. PT maneuvers pt's power WC out of room and pt begins driving WC herself in open hallway. Pt requires intermittent cueing for redirection due to veering to the L and approaching wall without seeming to be aware of proximity to other objects. PT provides verbal command "Stop", whenever pt is approaching obstacles and pt is consistent with releasing joystick and stopping motor of WC. Pt has increased difficulty with tight turns and PT provides practice with "0 radius" turns by pushing th joystick directly R or L. Pt performs WC navigation through obstacle course, tasked with weaving in and out of cones and between narrow cones. PT provides demonstration prior to pt trial. On initial attempt pt nearly completes obstacle course correctly and accurately with minimal cueing, but does come into contact with 1 cone during 180 degree turn. On 2nd attempt, pt appears to lose focus on task and simply drives her WC forward along one side of cones, then turns a drives back along same side, neglecting to weave or perform any aspect of obstacle course.  Pt performs slideboard transfer back to bed with CGA and cues for body mechanics and sequencing. PT assists with doffing bilateral prostheses. Sit to supine with minA. Left with  alarm intact and all needs within reach.  Therapy Documentation Precautions:  Precautions Precautions: Fall, Other (comment) Precaution Comments: L hemiplegia, Bil BKA 2 years ago and 1.5 years ago Required Braces or Orthoses: Other Brace Other Brace: bil prosthetic Restrictions Weight Bearing Restrictions: No   Therapy/Group: Individual Therapy  Breck Coons, PT, DPT 04/05/2022, 5:02 PM

## 2022-04-05 NOTE — Progress Notes (Signed)
Physical Therapy Session Note  Patient Details  Name: Cindy Osborne MRN: [id number] Date of Birth: Dec 19, 1965  Today's Date: 04/05/2022 PT Individual Time: 1130-1215 PT Individual Time Calculation (min): 45 min   Short Term Goals: Week 2:  PT Short Term Goal 1 (Week 2): Patient will perform basic transfers with min A using LRAD. PT Short Term Goal 2 (Week 2): Patient will perform sit to stand with mod A using LRAD. PT Short Term Goal 3 (Week 2): Patient will initiate gait training. PT Short Term Goal 4 (Week 2): Patient will propel w/c >50 ft with min A.  Skilled Therapeutic Interventions/Progress Updates:     Patient in power w/c in the room upon PT arrival. Patient alert and agreeable to PT session. Patient denied pain during session.  Focused session on power w/c mobility and motor control with standing and gait in the Lite Gait.  Wheelchair Mobility:  Patient propelled wheelchair from her room to/from the Day room, ~60 feet with supervision and max cues for correcting L veering, attention to task, and steering. Initiated training for backing up with max A hand-over-hand assist x2. Patient performed power tilt, switching between driving and chair controls with max A.   Neuromuscular Re-ed: -sit to/from stand x4 holding Lite Gait with R hand initially, applied L hand to hand grip with ACE wrap following first stand with min A to facilitate forward weight shift and L hip/knee extension with and without harness in place -initiated therapeutic gait training x15 feet in the Lite Gait to off weight her joints with current hemiplegia, patient with poor hip extension in sling, frequently sitting back in the harness due to decreased awareness, noted ataxic gait with variable foot placement, and >50% assist for L limb advancement  Patient required increased time for initiation, cuing, rest breaks, and for completion of tasks throughout session. Utilized therapeutic use of self throughout to  promote efficiency.   Patient in power w/c with strict instructions to not propel the w/c in or out of the room at end of session with breaks locked, seat belt secured, and all needs within reach.   Therapy Documentation Precautions:  Precautions Precautions: Fall, Other (comment) Precaution Comments: L hemiplegia, Bil BKA 2 years ago and 1.5 years ago Required Braces or Orthoses: Other Brace Other Brace: bil prosthetic Restrictions Weight Bearing Restrictions: No    Therapy/Group: Individual Therapy  Tavares Levinson L Oswell Say PT, DPT, NCS, CBIS  04/05/2022, 4:56 PM

## 2022-04-05 NOTE — Progress Notes (Signed)
PROGRESS NOTE   Subjective/Complaints:  No issues overnite asking about need for loop recorder placement, we discussed that A fib has been diagnosed and this will no longer be needed   ROS: Patient denies CP, SOB, N/V/D Objective:   No results found. Recent Labs    04/04/22 1250  WBC 7.1  HGB 11.2*  HCT 34.6*  PLT 263   Recent Labs    04/04/22 1250  NA 130*  K 4.1  CL 92*  CO2 23  GLUCOSE 208*  BUN 65*  CREATININE 9.24*  CALCIUM 9.6    Intake/Output Summary (Last 24 hours) at 04/05/2022 0858 Last data filed at 04/05/2022 8882 Gross per 24 hour  Intake 354 ml  Output 1000 ml  Net -646 ml      Pressure Injury 04/04/22 Buttocks Left Stage 2 -  Partial thickness loss of dermis presenting as a shallow open injury with a red, pink wound bed without slough. 1x0.5 pinkish superficial open area. (Active)  04/04/22 2028  Location: Buttocks  Location Orientation: Left  Staging: Stage 2 -  Partial thickness loss of dermis presenting as a shallow open injury with a red, pink wound bed without slough.  Wound Description (Comments): 1x0.5 pinkish superficial open area.  Present on Admission: No    Physical Exam: Vital Signs Blood pressure 134/75, pulse 72, temperature 98 F (36.7 C), resp. rate 18, height 5\' 9"  (1.753 m), weight 104.7 kg, SpO2 99 %.   General: No acute distress Mood and affect are appropriate Heart: Regular rate and rhythm no rubs murmurs or extra sounds Lungs: Clear to auscultation, breathing unlabored, no rales or wheezes Abdomen: Positive bowel sounds, soft nontender to palpation, nondistended Extremities: No clubbing, cyanosis, or edema   Skin: small buttocks stage 2 as above no drainage or odor Neurologic: Cranial nerves II through XII intact, motor strength is 5/5 in right deltoid, bicep, tricep, grip, hip flexor, knee extensors, 1-2/5 left delt, biceps, 2-triceps and finger flexors, 1/5  hip flexors and 3 knee flexors and extensors--no changes today Sensory exam normal sensation to light touch and proprioception in right upper and lower extremities, absent LT sensation LUE, intact LLE Cerebellar exam normal finger to nose to finger as well as heel to shin in bilateral upper and lower extremities Musculoskeletal: no pain with left hip ROM, no tenderness to palpation    Assessment/Plan: 1. Functional deficits which require 3+ hours per day of interdisciplinary therapy in a comprehensive inpatient rehab setting. Physiatrist is providing close team supervision and 24 hour management of active medical problems listed below. Physiatrist and rehab team continue to assess barriers to discharge/monitor patient progress toward functional and medical goals  Care Tool:  Bathing    Body parts bathed by patient: Left arm, Chest, Abdomen, Right upper leg, Face   Body parts bathed by helper: Front perineal area, Buttocks, Right arm Body parts n/a: Left lower leg, Right lower leg   Bathing assist Assist Level: Maximal Assistance - Patient 24 - 49%     Upper Body Dressing/Undressing Upper body dressing   What is the patient wearing?: Pull over shirt    Upper body assist Assist Level: Moderate Assistance - Patient  50 - 74%    Lower Body Dressing/Undressing Lower body dressing      What is the patient wearing?: Pants     Lower body assist Assist for lower body dressing: Maximal Assistance - Patient 25 - 49%     Toileting Toileting    Toileting assist Assist for toileting: Dependent - Patient 0%     Transfers Chair/bed transfer  Transfers assist  Chair/bed transfer activity did not occur: Safety/medical concerns  Chair/bed transfer assist level: 2 Helpers     Locomotion Ambulation   Ambulation assist   Ambulation activity did not occur: Safety/medical concerns          Walk 10 feet activity   Assist  Walk 10 feet activity did not occur: Safety/medical  concerns        Walk 50 feet activity   Assist Walk 50 feet with 2 turns activity did not occur: Safety/medical concerns         Walk 150 feet activity   Assist Walk 150 feet activity did not occur: Safety/medical concerns         Walk 10 feet on uneven surface  activity   Assist Walk 10 feet on uneven surfaces activity did not occur: Safety/medical concerns         Wheelchair     Assist Is the patient using a wheelchair?: Yes Type of Wheelchair: Manual    Wheelchair assist level: Dependent - Patient 0% Max wheelchair distance: >150 feet    Wheelchair 50 feet with 2 turns activity    Assist        Assist Level: Dependent - Patient 0%   Wheelchair 150 feet activity     Assist      Assist Level: Dependent - Patient 0%   Blood pressure 134/75, pulse 72, temperature 98 F (36.7 C), resp. rate 18, height 5\' 9"  (1.753 m), weight 104.7 kg, SpO2 99 %.  Medical Problem List and Plan: 1. Functional deficits secondary to RIght MCA cardioembolic infarct complicated by prior hx B BKA             -patient may  shower             -ELOS/Goals: MinA mobility and ADL, use R BKA prothetic to transfer to WC ,   -Continue CIR therapies including PT, OT, and SLP  2.  Antithrombotics: -DVT/anticoagulation:  Pharmaceutical: Eliquis             -antiplatelet therapy: N/A 3. Pain Management: Tylenol prn.  4. Mood/Behavior/Sleep: LCSW to follow for evaluation and support             -continue Elavil at bedtime for insomnia.              -antipsychotic agents: N/A 5. Neuropsych/cognition: This patient is capable of making decisions on her own behalf. 6. Skin/Wound Care: I suspect the small lesion on her back is a sebaceous cyst which perhaps bled centrally and subsequently scabbed. -will try warm moist compresses to the area to see if we can open it up  7. Fluids/Electrolytes/Nutrition: Strict I/O. Add ed1200 cc FR/day.             --daily weights.  8. New  onset A fib w/RVR: Monitor HR TID continue Eliquis. 9. T2DM: Hgb A1C- 6.0 and well controlled. Was on insulin Glargine 18 units with 5 units TID meal coverage.  CBG (last 3)  Recent Labs    04/04/22 1847 04/04/22 2118 04/05/22 0549  GLUCAP 131* 136*  132*    Borderline control sees Endo as OP, discussed that she is currently managed with diet only and doing reasonably well, if her diet doesn't change at home she may be managed with oral agent ,Tradjenta may be tried in hospital , pt would like to think about it  10. ESRD: HD TTS at the end of the day to help with tolerance of therapy during the day.  Appreciate Nephro assistance - discussed with Nephro, no vascular access issues  11. Small thyroid nodule: Follow up with PCP after discharge for further work up.  12.  Patchy/nodular chest densities: CT chest recommended for follow-up 13. OSA: Continue BIPAP when asleep.   57.  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  Fall PTA with Left hip and shoulder pain check xrays although exam appears mainly consistent with post CVA shoulder pain as well as Left hip troch bursitis  8/14-mild to moderate OA visible on xrays of left shoulder and pelvis    -add voltaren gel to shoulder and hip pain 16.Dysphagia- adv per SLP. Seems to be tolerating diet well   LOS:  11 days A FACE TO FACE EVALUATION WAS PERFORMED  Charlett Blake 04/05/2022, 8:58 AM

## 2022-04-05 NOTE — Plan of Care (Signed)
  Problem: RH Memory Goal: LTG Patient will use memory compensatory aids to (SLP) Description: LTG:  Patient will use memory compensatory aids to recall biographical/new, daily complex information with cues (SLP) Flowsheets (Taken 04/05/2022 1016) LTG: Patient will use memory compensatory aids to (SLP): Minimal Assistance - Patient > 75% Note: Downgraded due to slower than anticipated progress   Problem: RH Problem Solving Goal: LTG Patient will demonstrate problem solving for (SLP) Description: LTG:  Patient will demonstrate problem solving for basic/complex daily situations with cues  (SLP) Flowsheets (Taken 04/05/2022 1016) LTG: Patient will demonstrate problem solving for (SLP): Basic daily situations LTG Patient will demonstrate problem solving for:  Minimal Assistance - Patient > 75%  Moderate Assistance - Patient 50 - 74%   Problem: RH Attention Goal: LTG Patient will demonstrate this level of attention during functional activites (SLP) Description: LTG:  Patient will will demonstrate this level of attention during functional activites (SLP) Flowsheets (Taken 04/05/2022 1016) Patient will demonstrate during cognitive/linguistic activities the attention type of: Sustained LTG: Patient will demonstrate this level of attention during cognitive/linguistic activities with assistance of (SLP): Minimal Assistance - Patient > 75%   Problem: RH Awareness Goal: LTG: Patient will demonstrate awareness during functional activites type of (SLP) Description: LTG: Patient will demonstrate awareness during functional activites type of (SLP) Flowsheets (Taken 04/05/2022 1016) Patient will demonstrate during cognitive/linguistic activities awareness type of: Intellectual LTG: Patient will demonstrate awareness during cognitive/linguistic activities with assistance of (SLP): Minimal Assistance - Patient > 75%

## 2022-04-05 NOTE — Progress Notes (Signed)
Speech Language Pathology Daily Session Note  Patient Details  Name: Cindy Osborne MRN: [id number] Date of Birth: July 27, 1966  {chl ip rehab slp time calculations:304100500}  Short Term Goals: {BMW:4132440}  Skilled Therapeutic Interventions: Skilled ST treatment focused on *** goals. Pt was sitting upright in power wheelchair on arrival. Pt requested warm a blanket and SLP exited room to retrieve blanket and therapy materials. While out of room for approximately 2 minutes, SLP was notified by staff that pt was navigating her way down the hall in power wheelchair unsupervised. When SLP located pt, who was unharmed, pt stated she thought SLP wanted her to find her in the hall. SLP assisted pt with navigating back to room with mod A verbal cues for visually attending to left side. Pt exhibited difficulty with turns and SLP eventually provided total A for safety.   SLP facilitated medication management task by providing total fading to max A verbal/visual cues for loading practice medications into BID pillbox. Pt required consistent verbal   Patient was left in *** with alarm activated and immediate needs within reach at end of session. Continue per current plan of care.       Pain    Therapy/Group: {SLP Therapy/Group:[id number]}  Patty Sermons 04/05/2022, 2:41 PM

## 2022-04-05 NOTE — Progress Notes (Signed)
  [facility] Progress Note   Subjective:  patient seen in room. No sob or leg swelling. No c/o's. Wants to resume her home Velphoro 3 ac tid.   Objective Vitals:   04/04/22 1949 04/05/22 0346 04/05/22 0418 04/05/22 0836  BP: 121/65 134/75    Pulse: 72 72    Resp: 17 18    Temp: 98.1 F (36.7 C) 98 F (36.7 C)    TempSrc:      SpO2: 97% 99%    Weight:   104.7 kg   Height:    5\' 9"  (1.753 m)   Physical Exam General: NAD Heart: RRR, no murmurs, rubs or gallops Lungs: CTA bilaterally Abdomen: obese, soft, non-distended Extremities: No edema b/l lower extremities Neuro: awake, alert Dialysis Access:  LUE AVF + bruit  OP HD:  TTS ([facility] [phone])   4h  104.3kg  400/600  2/2.5 bath  Heparin 2000 LUA AVF  hep B 8/08 - mircera 150 ug q2, last 8/5, last Hb 9.7 - calcitriol 0.5 ug tiw - sensipar 90 po tiw    Assessment/Plan: Debility - post CVA, on CIR now.  Acute CVA - sp intervention 8/8. Was here in June for similar event. Deficits improving some.  pAFib - seen by cardiology and getting metoprolol 200mg  daily & eliquis.  Acute resp failure w/ hypoxemia - resolved  ESRD - on HD TTS. HD tomorrow.  HTN/Volume - BPs lower and pt is below dry wt. Metoprolol resumed per cardiology. At dry wt, will lower UF goal next HD.  Anemia of CKD - Hgb 11.5 8/15.  Esa last given 8/5, no esa needs.  8.   MBD ckd - CCa high, vdra on hold. Sensipar resumed. Phos 7, resume home velphoro 3 ac tid.   [name], MD 04/05/2022, 12:25 PM     Recent Labs  Lab 04/04/22 1250  HGB 11.2*  ALBUMIN 3.1*  CALCIUM 9.6  PHOS 7.0*  CREATININE 9.24*  K 4.1    Inpatient medications:  amitriptyline  25 mg Oral QHS   apixaban  5 mg Oral BID   Chlorhexidine Gluconate Cloth  6 each Topical Q0600   insulin aspart  0-5 Units Subcutaneous QHS   insulin aspart  0-6 Units Subcutaneous TID WC   metoprolol succinate  200 mg Oral Daily   pravastatin  40 mg Oral q1800     acetaminophen, bisacodyl, diphenhydrAMINE, guaiFENesin-dextromethorphan, mouth rinse, polyethylene glycol, prochlorperazine **OR** prochlorperazine **OR** prochlorperazine, traMADol

## 2022-04-05 NOTE — Progress Notes (Signed)
Occupational Therapy Session Note  Patient Details  Name: Cindy Osborne MRN: [id number] Date of Birth: [birth year]  Today's Date: 04/05/2022 OT Individual Time: 0942-1100 OT Individual Time Calculation (min): 78 min    Short Term Goals: Week 2:  OT Short Term Goal 1 (Week 2): Patient demonstrated impoved awareness of L UE by positioning prior to transfer with min verbal cues. OT Short Term Goal 2 (Week 2): Patient will recall hemi dressing techniques with min cues OT Short Term Goal 3 (Week 2): Patient will perform one step of toileting task  Skilled Therapeutic Interventions/Progress Updates:  Pt awake in bed upon OT arrival to the room. Pt reports, "I have been up since 4." Pt in agreement for OT session.  Therapy Documentation Precautions:  Precautions Precautions: Fall, Other (comment) Precaution Comments: L hemiplegia, Bil BKA 2 years ago and 1.5 years ago Required Braces or Orthoses: Other Brace Other Brace: bil prosthetic Restrictions Weight Bearing Restrictions: No Vital Signs: Please see "Flowsheet" for most recent vitals charted by nursing staff.  Pain: Pain Assessment Pain Scale: 0-10 Pain Score: 0-No pain  ADL: Lower Body Dressing: Maximal assistance (Pt requires maximal assistance to thread BLE through shorts and to pull up using SARA lift for standing balance.) Where Assessed-Lower Body Dressing:  (on BSC placed over the toilet with use of SARA for standing) Toileting: Maximal assistance (Pt requires maximal assistance to perform toileting hygiene and clothing management on BSC placed over the toilet with use of SARA lift for standing balance. Pt able to use RUE to assist with pulling clothing up over buttocks on R side.) Where Assessed-Toileting: Bedside Commode (placed over toilet) Toilet Transfer: Dependent (Pt requires use of SARA lift to transfer from EOB > wide BSC placed over the toilet > PWC. Pt able to perform sit <> stand in SARA lift with minimal  assist for force production on L side. Pt demos fair attention to LUE to place on the SARA lift.) Toilet Transfer Method:  (SARA lift) Toilet Transfer Equipment: Extra wide bedside commode (placed over the toilet) ADL Comments: Pt requires maximal assistance for supine > sit transfer with R side exit out of bed. Pt requires maximal assistance to don BLE prosthetics at EOB. Pt able to perform sit <> stand in use of SARA lift to perform various transfers from EOB > BSC placed over the toilet > PWC.  Transfer Training: Pt able to recall techniques to use SARA lift. Pt able to perform sit <> stand with minimal assistance with pt demo'ing good attention to LUE by placing on SARA lift and involving in transfer. Pt reports potential plan to obtain a SARA lift for safe DC to home.   WC Navigation & Management: Pt participates in practicing managing PWB with R side joystick. Pt requires overall moderate assistance to decrease the risk of collisions on the L side. Pt tends to drift to L side frequently and requires assistance to navigate through smaller environments such as through doorways and to position PWC. Pt demo's overall decreased attention when using PWC in the hallway requiring maximal prompts for safe attention for navigation. Pt able to manage PWC from room > day room > room with overall moderate - maximal assistance for safety.   Pt requested to stay in the [place] at end of session. Pt left sitting comfortably in the [place] with personal belongings and call light within reach, belt alarm placed and activated, and comfort needs attended to.   Therapy/Group: Individual Therapy  Barbee Shropshire 04/05/2022, 11:03  AM

## 2022-04-06 LAB — GLUCOSE, CAPILLARY
Glucose-Capillary: 126 mg/dL — ABNORMAL HIGH (ref 70–99)
Glucose-Capillary: 122 mg/dL — ABNORMAL HIGH (ref 70–99)
Glucose-Capillary: 149 mg/dL — ABNORMAL HIGH (ref 70–99)
Glucose-Capillary: 139 mg/dL — ABNORMAL HIGH (ref 70–99)
Glucose-Capillary: 196 mg/dL — ABNORMAL HIGH (ref 70–99)

## 2022-04-06 MED ORDER — ZINC OXIDE 40 % EX OINT
TOPICAL_OINTMENT | Freq: Two times a day (BID) | CUTANEOUS | Status: DC
  Filled 2022-04-06: qty 57

## 2022-04-06 MED ORDER — HEPARIN SODIUM (PORCINE) 1000 UNIT/ML IJ SOLN
INTRAMUSCULAR | Status: AC
  Filled 2022-04-06: qty 2

## 2022-04-06 NOTE — Progress Notes (Signed)
PROGRESS NOTE   Subjective/Complaints:  Discussed SNF option, pt wishes to pursue this, discussed that search should start due to limited SNFs that accept HD pts and that if bed becomes available prior to 9/4 , that she may be transferred then .  ROS: Patient denies CP, SOB, N/V/D Objective:   No results found. Recent Labs    04/04/22 1250  WBC 7.1  HGB 11.2*  HCT 34.6*  PLT 263    Recent Labs    04/04/22 1250  NA 130*  K 4.1  CL 92*  CO2 23  GLUCOSE 208*  BUN 65*  CREATININE 9.24*  CALCIUM 9.6     Intake/Output Summary (Last 24 hours) at 04/06/2022 0802 Last data filed at 04/05/2022 1318 Gross per 24 hour  Intake 472 ml  Output --  Net 472 ml          Physical Exam: Vital Signs Blood pressure (!) 144/79, pulse 69, temperature 98.4 F (36.9 C), resp. rate 14, height 5\' 9"  (1.753 m), weight 107.1 kg, SpO2 97 %.   General: No acute distress Mood and affect are appropriate Heart: Regular rate and rhythm no rubs murmurs or extra sounds Lungs: Clear to auscultation, breathing unlabored, no rales or wheezes Abdomen: Positive bowel sounds, soft nontender to palpation, nondistended Extremities: No clubbing, cyanosis, or edema   Skin: small left 1x1.5cm buttocks stage 2 as above no drainage or odor Neurologic: Cranial nerves II through XII intact, motor strength is 5/5 in right deltoid, bicep, tricep, grip, hip flexor, knee extensors, 1-2/5 left delt, biceps, 2-triceps and finger flexors, 1/5 hip flexors and 3 knee flexors and extensors--no changes today Sensory exam normal sensation to light touch and proprioception in right upper and lower extremities, absent LT sensation LUE, intact LLE Cerebellar exam normal finger to nose to finger as well as heel to shin in bilateral upper and lower extremities Musculoskeletal: no pain with left hip ROM, no tenderness to palpation    Assessment/Plan: 1. Functional  deficits which require 3+ hours per day of interdisciplinary therapy in a comprehensive inpatient rehab setting. Physiatrist is providing close team supervision and 24 hour management of active medical problems listed below. Physiatrist and rehab team continue to assess barriers to discharge/monitor patient progress toward functional and medical goals  Care Tool:  Bathing    Body parts bathed by patient: Left arm, Chest, Abdomen, Right upper leg, Face   Body parts bathed by helper: Front perineal area, Buttocks, Right arm Body parts n/a: Left lower leg, Right lower leg   Bathing assist Assist Level: Maximal Assistance - Patient 24 - 49%     Upper Body Dressing/Undressing Upper body dressing   What is the patient wearing?: Pull over shirt    Upper body assist Assist Level: Moderate Assistance - Patient 50 - 74%    Lower Body Dressing/Undressing Lower body dressing      What is the patient wearing?: Pants     Lower body assist Assist for lower body dressing: Maximal Assistance - Patient 25 - 49%     Toileting Toileting    Toileting assist Assist for toileting: Dependent - Patient 0%     Transfers Chair/bed  transfer  Transfers assist  Chair/bed transfer activity did not occur: Safety/medical concerns  Chair/bed transfer assist level: 2 Helpers     Locomotion Ambulation   Ambulation assist   Ambulation activity did not occur: Safety/medical concerns          Walk 10 feet activity   Assist  Walk 10 feet activity did not occur: Safety/medical concerns        Walk 50 feet activity   Assist Walk 50 feet with 2 turns activity did not occur: Safety/medical concerns         Walk 150 feet activity   Assist Walk 150 feet activity did not occur: Safety/medical concerns         Walk 10 feet on uneven surface  activity   Assist Walk 10 feet on uneven surfaces activity did not occur: Safety/medical concerns          Wheelchair     Assist Is the patient using a wheelchair?: Yes Type of Wheelchair: Manual    Wheelchair assist level: Dependent - Patient 0% Max wheelchair distance: >150 feet    Wheelchair 50 feet with 2 turns activity    Assist        Assist Level: Dependent - Patient 0%   Wheelchair 150 feet activity     Assist      Assist Level: Dependent - Patient 0%   Blood pressure (!) 144/79, pulse 69, temperature 98.4 F (36.9 C), resp. rate 14, height 5\' 9"  (1.753 m), weight 107.1 kg, SpO2 97 %.  Medical Problem List and Plan: 1. Functional deficits secondary to RIght MCA cardioembolic infarct complicated by prior hx B BKA             -patient may  shower             -ELOS/Goals: MinA mobility and ADL, use R BKA prothetic to transfer to [facility] ,  Not sure whether electric WC will be a safe choice due to Left neglect  Discussed SNF option, pt wishes to pursue this, discussed that search should start due to limited SNFs that accept HD pts and that if bed becomes available prior to 9/4 , that she may be transferred then .  -Continue CIR therapies including PT, OT, and SLP  2.  Antithrombotics: -DVT/anticoagulation:  Pharmaceutical: Eliquis             -antiplatelet therapy: N/A 3. Pain Management: Tylenol prn.  4. Mood/Behavior/Sleep: LCSW to follow for evaluation and support             -continue Elavil at bedtime for insomnia.              -antipsychotic agents: N/A 5. Neuropsych/cognition: This patient is capable of making decisions on her own behalf. 6. Skin/Wound Care: Gr 2 left buttocks no pain or drainage, add desitin BID , roll q 2 h 7. Fluids/Electrolytes/Nutrition: Strict I/O. Add ed1200 cc FR/day.             --daily weights.  8. New onset A fib w/RVR: Monitor HR TID continue Eliquis. 9. T2DM: Hgb A1C- 6.0 and well controlled. Was on insulin Glargine 18 units with 5 units TID meal coverage.  CBG (last 3)  Recent Labs    04/05/22 2046 04/06/22 0530  04/06/22 0635  GLUCAP 234* 126* 122*    Borderline control sees Endo as OP, discussed that she is currently managed with diet only and doing reasonably well, if her diet doesn't change at home she may  be managed with oral agent ,Tradjenta may be tried in hospital , pt would like to think about it  10. ESRD: HD TTS at the end of the day to help with tolerance of therapy during the day.  Appreciate Nephro assistance - discussed with Nephro, no vascular access issues  11. Small thyroid nodule: Follow up with PCP after discharge for further work up.  12.  Patchy/nodular chest densities: CT chest recommended for follow-up 13. OSA: Continue BIPAP when asleep.   34.  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  Fall PTA with Left hip and shoulder pain check xrays although exam appears mainly consistent with post CVA shoulder pain as well as Left hip troch bursitis  8/14-mild to moderate OA visible on xrays of left shoulder and pelvis    -add voltaren gel to shoulder and hip pain 16.Dysphagia- adv per SLP. Seems to be tolerating diet well   LOS:  12 days A FACE TO FACE EVALUATION WAS PERFORMED  Charlett Blake 04/06/2022, 8:02 AM

## 2022-04-06 NOTE — Progress Notes (Signed)
Occupational Therapy Session Note  Patient Details  Name: Cindy Osborne MRN: [id number] Date of Birth: February 10, 1966  Today's Date: 04/06/2022 OT Individual Time: 0940-1100 OT Individual Time Calculation (min): 80 min    Short Term Goals: Week 2:  OT Short Term Goal 1 (Week 2): Patient demonstrated impoved awareness of L UE by positioning prior to transfer with min verbal cues. OT Short Term Goal 2 (Week 2): Patient will recall hemi dressing techniques with min cues OT Short Term Goal 3 (Week 2): Patient will perform one step of toileting task  Skilled Therapeutic Interventions/Progress Updates:  Pt asleep in bed upon OT arrival to the room. Pt reports, "I didn't bother the nurses and they didn't bother me. I slept good." Pt in agreement for OT session.  Therapy Documentation Precautions:  Precautions Precautions: Fall, Other (comment) Precaution Comments: L hemiplegia, Bil BKA 2 years ago and 1.5 years ago Required Braces or Orthoses: Other Brace Other Brace: bil prosthetic Restrictions Weight Bearing Restrictions: No Vital Signs: Please see "Flowsheet" for most recent vitals charted by nursing staff.  Pain: Pain Assessment Pain Scale: 0-10 Pain Score: 0-No pain Pain Location: Hip Pain Orientation: Left Pain Descriptors / Indicators: Aching Pain Onset: On-going Pain Intervention(s): Medication (See eMAR)  ADL: Grooming: Moderate assistance (Pt requires moderate assistance to thoroughly shave face d/t decreased attention to the L side of the face while seated in the w/c at the sink.) Where Assessed-Grooming: Sitting at sink, Wheelchair Upper Body Bathing: Moderate assistance (Pt able to bathe 3/5 UB parts while seated in wide BSC placed in the shower. pt requires assistance to thoroughly bathe BUE due to decreased attention to task while bathing UE's. Pt unable to bathe RUE due to decreased LUE motor control.) Where Assessed-Upper Body Bathing: Shower Lower Body Bathing:  Maximal assistance (Pt able to bathe 1/3 LB parts while seated on wide BSC in the shower. Pt able to bathe RLE residual limg, however, requires assistance to bathe L residual limb & buttocks/peri-area.) Where Assessed-Lower Body Bathing: Shower Upper Body Dressing: Moderate assistance (Pt requires assistance to fully manage LUE to don through sleeve and intermittent assist to fully pull over head and down trunk. However, pt able to don a pull-over style shirt with moderate assistance and maximal VC's. Pt unable to recall heni-tech.) Where Assessed-Upper Body Dressing:  (BSC in the shower after bathing task) Lower Body Dressing: Maximal assistance (Pt requires assistance to thread B residuial limbs through underwear/pants and to pull up over buttocks when standing. Pt able to participate in pulling clothing up over BLE while seated. Pt requires maximal assistance to don/doff BLE prosthetics.) Where Assessed-Lower Body Dressing:  (wide BSC placed in the shower after bathing task) Toileting: Dependent (Pt demo's minimal incontinence of bowels while seated on BSC in the shower and requires total assistance to perform toileting hygiene and clothing management while standing in the [place] with CGA.) Where Assessed-Toileting:  (BSC in shower) Toilet Transfer: Dependent (Pt requires use of SARA lift to transfer from EOB > wide BSC placed over the toilet > PWC. Pt able to perform sit <> stand in SARA lift with minimal assist for force production on L side. Pt demos fair attention to LUE to place on the SARA lift.) Toilet Transfer Method:  (SARA lift) Toilet Transfer Equipment: Extra wide bedside commode (placed over the toilet) Walk-In Shower Transfer: Dependent (Pt able to perform sit <> stand in stedy lift with minimal assistance for force production and total assist via lift to transport from EOB >  BSC placed in the shower > standard w/c.) Social research officer, government Method:  Charlaine Dalton) Walk-In Shower Equipment: Grab  bars (wide BSC placed in the shower) ADL Comments: Pt participates well in ADL routine today. Pt requires moderate - maximal VCs for attention to task and sequencing of tasks. Pt unable to recall hemi-techniques for dressing and requires re-education to improve independence with task. Pt requires maximal assistance to manage LUE through clothing and during bathing tasks. Pt demo's good sitting balance with close - distant supervision (to allow therapist to grab things outside of bathroom) while seated on the wide BSC in the shower. Pt requires increased time to complete ADL routine due to various small amounts of BM during shower and requiring increased prompts throughout the session.   Pt requested to stay in the w/c at end of session. Pt left sitting comfortably in the w/c with personal belongings and call light within reach, belt alarm placed and activated, and comfort needs attended to.   Therapy/Group: Individual Therapy  Barbee Shropshire 04/06/2022, 12:37 PM

## 2022-04-06 NOTE — Progress Notes (Signed)
Patient ID: Cindy Osborne, female   DOB: June 08, 1966, 56 y.o.   MRN: [id number]  Per MD, pt would llike to pursue SNF.  SW returned phone call to [name] to discuss discharge plan. Reports they would like ot move forward with placement. States her mother and grandmother will begin looking at SNFs. SW emailed her the link for nursing facilites. SW shared if they can select their preferred locations,and we will send out a the referral as well. SW explained potential barriers with finding a preferred SNF due to dialysis, however, will work on this.   SNF referral sent out.   PASRR Level II referral initiated (due to panic disorder);pending eval by state.   SW updated medical team.   Loralee Pacas, MSW, [place] Office: [phone] Cell: [phone] Fax: [phone]

## 2022-04-06 NOTE — NC FL2 (Signed)
[place] MEDICAID FL2 LEVEL OF CARE SCREENING TOOL     IDENTIFICATION  Patient Name: Cindy Osborne Birthdate: Apr 21, 1966 Sex: female Admission Date (Current Location): 03/25/2022  [facility] and [place] Number:  Herbalist and Address:  [facility], [place] [address] [place],  [address]      Provider Number: [id number]  Attending Physician Name and Address:  Charlett Blake, MD  Relative Name and Phone Number:  Contact pt sister [name] [phone]    Current Level of Care: Hospital Recommended Level of Care: [place] Prior Approval Number:    Date Approved/Denied:   PASRR Number:    Discharge Plan: SNF    Current Diagnoses: Patient Active Problem List   Diagnosis Date Noted   Pressure injury of skin 04/05/2022   Acute ischemic right MCA stroke ([place]) 03/25/2022   Stroke ([place]) 03/21/2022   Stroke (cerebrum) ([place]) 03/21/2022   Hyperlipidemia 01/24/2022   Obesity 01/24/2022   Acute right arterial ischemic stroke, MCA (middle cerebral artery) ([place]) 01/21/2022   Combined forms of age-related cataract of both eyes 06/30/2021   Diabetic macular edema ([place]) 06/30/2021   Proteinuria 28/36/6294   Metabolic acidosis 76/54/6503   Encounter for insertion of mirena IUD 03/15/2021   Panic disorder 03/03/2021   Acquired absence of left leg below knee ([place]) 10/26/2020   PVD (peripheral vascular disease) ([place]) 07/28/2020   Uncontrolled type 2 diabetes mellitus with diabetic peripheral angiopathy without gangrene 02/26/2020   Acquired absence of right leg below knee ([place]) 07/28/2019   Diabetic retinopathy ([place]) 11/23/2016   Slow transit constipation 09/24/2015   Environmental and seasonal allergies 07/09/2015   Anemia associated with chronic renal failure 07/09/2015   ESRD (end stage renal disease) on dialysis ([place]) 07/09/2015   Type II diabetes mellitus with complication ([place]) 54/65/6812   Herpes simplex  infection 07/09/2015   Hyperlipidemia associated with type 2 diabetes mellitus ([place]) 07/09/2015   Heart & renal disease, hypertensive, with heart failure ([place]) 07/09/2015   Arthritis of knee, degenerative 07/09/2015   Tendinitis 07/09/2015   Avitaminosis D 07/09/2015   Obstructive apnea 12/15/2014   Intracervical pessary 01/07/2014   Essential hypertension 09/11/2013    Orientation RESPIRATION BLADDER Height & Weight     Self, Time, Situation, Place  Normal Incontinent Weight: 228 lb 9.9 oz (103.7 kg) Height:  5\' 9"  (175.3 cm)  BEHAVIORAL SYMPTOMS/MOOD NEUROLOGICAL BOWEL NUTRITION STATUS      Incontinent Diet (D3 thin)  AMBULATORY STATUS COMMUNICATION OF NEEDS Skin   Limited Assist Verbally Normal                       Personal Care Assistance Level of Assistance  Bathing, Feeding, Dressing Bathing Assistance: Limited assistance Feeding assistance: Limited assistance Dressing Assistance: Limited assistance     Functional Limitations Info  Sight, Hearing, Speech Sight Info: Adequate Hearing Info: Adequate Speech Info: Adequate    SPECIAL CARE FACTORS FREQUENCY  PT (By licensed PT), OT (By licensed OT), Speech therapy     PT Frequency: 5xs per week OT Frequency: 5xs per week     Speech Therapy Frequency: 5xs per week      Contractures Contractures Info: Not present    Additional Factors Info  Code Status, Allergies, Insulin Sliding Scale Code Status Info: Full Allergies Info: See discharge instructions   Insulin Sliding Scale Info: See d/c instructions       Current Medications (04/06/2022):  This is the current hospital  active medication list Current Facility-Administered Medications  Medication Dose Route Frequency Provider Last Rate Last Admin   acetaminophen (TYLENOL) tablet 325-650 mg  325-650 mg Oral Q4H PRN Bary Leriche, PA-C   650 mg at 04/03/22 1543   amitriptyline (ELAVIL) tablet 25 mg  25 mg Oral QHS Love, Pamela S, PA-C   25 mg at 04/05/22  2147   apixaban (ELIQUIS) tablet 5 mg  5 mg Oral BID Love, Pamela S, PA-C   5 mg at 04/06/22 0750   bisacodyl (DULCOLAX) suppository 10 mg  10 mg Rectal Daily PRN Bary Leriche, PA-C       diphenhydrAMINE (BENADRYL) 12.5 MG/5ML elixir 12.5-25 mg  12.5-25 mg Oral Q6H PRN Bary Leriche, PA-C   25 mg at 04/02/22 2133   guaiFENesin-dextromethorphan (ROBITUSSIN DM) 100-10 MG/5ML syrup 5-10 mL  5-10 mL Oral Q6H PRN Bary Leriche, PA-C       heparin sodium (porcine) 1000 UNIT/ML injection            insulin aspart (novoLOG) injection 0-5 Units  0-5 Units Subcutaneous QHS Bary Leriche, PA-C   2 Units at 04/05/22 2147   insulin aspart (novoLOG) injection 0-6 Units  0-6 Units Subcutaneous TID WC Bary Leriche, PA-C   1 Units at 04/05/22 1815   liver oil-zinc oxide (DESITIN) 40 % ointment   Topical BID Charlett Blake, MD   Given at 04/06/22 1216   metoprolol succinate (TOPROL-XL) 24 hr tablet 200 mg  200 mg Oral Daily Bary Leriche, PA-C   200 mg at 04/06/22 0750   Oral care mouth rinse  15 mL Mouth Rinse PRN Kirsteins, Luanna Salk, MD       polyethylene glycol (MIRALAX / GLYCOLAX) packet 17 g  17 g Oral Daily PRN Bary Leriche, PA-C   17 g at 03/29/22 0735   pravastatin (PRAVACHOL) tablet 40 mg  40 mg Oral q1800 Bary Leriche, PA-C   40 mg at 04/05/22 1815   prochlorperazine (COMPAZINE) tablet 5-10 mg  5-10 mg Oral Q6H PRN Reesa Chew S, PA-C       Or   prochlorperazine (COMPAZINE) injection 5-10 mg  5-10 mg Intramuscular Q6H PRN Love, Pamela S, PA-C       Or   prochlorperazine (COMPAZINE) suppository 12.5 mg  12.5 mg Rectal Q6H PRN Love, Pamela S, PA-C       traMADol (ULTRAM) tablet 25-50 mg  25-50 mg Oral Q6H PRN Love, Pamela S, PA-C   50 mg at 04/05/22 1401     Discharge Medications: Please see discharge summary for a list of discharge medications.  Relevant Imaging Results:  Relevant Lab Results:   Additional Information [id number]; Dialysis TTS witrh 1115 chair time.  Rana Snare, LCSW

## 2022-04-06 NOTE — Progress Notes (Signed)
Speech Language Pathology Daily Session Note  Patient Details  Name: Cindy Osborne MRN: [id number] Date of Birth: August 26, 1965  Today's Date: 04/06/2022 SLP Individual Time: 0802-0900 SLP Individual Time Calculation (min): 58 min  Short Term Goals: Week 2: SLP Short Term Goal 1 (Week 2): Pt will consume current diet with minimal overt s/sx of aspiration with sup A verbal cues to reduce rate of consumption, bite sizes, L pocketing/anterior spillage SLP Short Term Goal 2 (Week 2): Patient will implement speech intelligibility strategies at the sentence level with with sup A verbal cues to monitor and self correct SLP Short Term Goal 3 (Week 2): Patient will utilize internal/external memory strategies to recall novel information with min A verbal cues SLP Short Term Goal 4 (Week 2): Pt will demonstrate awareness of errors with min-to-mod A verbal/visual cues SLP Short Term Goal 5 (Week 2): Pt will complete mildly complex problem solving with min-to-mod A verbal/visual cues  Skilled Therapeutic Interventions: Skilled ST treatment focused on dysphagia and cognitive goals. NT arrived and requested clarification on full supervision status, as they indicated they weren't sure if pt still needed full supervision with PO intake. SLP reinforced necessity of full supervision due to ongoing dysphagia and cognitive factors resulting in impulsivity and reduced self monitoring skills of rate of consumption, bite sizes, talking while eating, anterior spillage, and pocketing. SLP also referred to full supervision recommendations and instructions depicted on signs in room x2, sign outside door x1, and depicted in both diet order and nursing orders in EMR. NT verbalized understanding and acknowledged signs in room with instructions.   SLP facilitated implementation of safe swallowing strategies and precautions with morning meal with overall min A verbal cues. Pt exhibited impulsivity by reaching for food on tray and  consuming large bite x1 supine position while SLP was in process of getting pt set-up for (e.g., retrieving towel for chest/lap). Pt required min A verbal cues to reduce rate of consumption, bite sizes, minimize talking while consuming meal, and intermittent verbal redirection for attention and persistence to task. Despite acknowledgement of verbal instruction, pt would continue to talk during meal. Pt required min A fading to sup A verbal cues for clearance of mild left buccal pocketing through lingual sweep or with oral sponge. Pt exhibited delayed cough x1 requiring sup A verbal cues to wait until coughing episode had halted prior to taking subsequent bites. Pt continues to exhibit mildly delayed mastication, mild anterior spillage, mild L pocketing, and mild oral residuals. Recommend continuation of dysphagia 3 textures with thin liquids and full supervision.  Patient was left in bed with alarm activated and immediate needs within reach at end of session. Continue per current plan of care.       Pain Pain Assessment Pain Scale: 0-10 Pain Score: 6  Pain Location: Hip Pain Orientation: Left Pain Descriptors / Indicators: Aching Pain Onset: On-going Pain Intervention(s): Medication (See eMAR)  Therapy/Group: Individual Therapy  Patty Sermons 04/06/2022, 8:12 AM

## 2022-04-06 NOTE — Procedures (Signed)
   I was present at this dialysis session, have reviewed the session itself and made  appropriate changes [name] MD [place] pager [phone]   04/06/2022, 3:52 PM

## 2022-04-06 NOTE — Progress Notes (Addendum)
Physical Therapy Session Note  Patient Details  Name: Cindy Osborne MRN: [id number] Date of Birth: 18-Sep-1965  Today's Date: 04/06/2022 PT Individual Time: 1105-1200   48mn  Short Term Goals: Week 1:  PT Short Term Goal 1 (Week 1): Patient will perform bed mobility with min A with our without use of bed rails. PT Short Term Goal 1 - Progress (Week 1): Met PT Short Term Goal 2 (Week 1): Patient will perform basic transfers with mod A >50% of the time. PT Short Term Goal 2 - Progress (Week 1): Met PT Short Term Goal 3 (Week 1): Patient will perform sit to stand with max A using LRAD. PT Short Term Goal 3 - Progress (Week 1): Met PT Short Term Goal 4 (Week 1): Patient will initiate w/c mobility. PT Short Term Goal 4 - Progress (Week 1): Met Week 2:  PT Short Term Goal 1 (Week 2): Patient will perform basic transfers with min A using LRAD. PT Short Term Goal 2 (Week 2): Patient will perform sit to stand with mod A using LRAD. PT Short Term Goal 3 (Week 2): Patient will initiate gait training. PT Short Term Goal 4 (Week 2): Patient will propel w/c >50 ft with min A. Week 3:     Skilled Therapeutic Interventions/Progress Updates:   Pt received sitting in WC and agreeable to PT. [facility]stand in [facility]edy x 5 throughout session with min assist fading to mod assist with fatigue. Pt performed cognitive task of foam lego tower performed x 2 while semi standing in stedy to force improved midline orientation. Max cues for reference to picture and sequencing of task. Cues also for visual scanning to the L to visualize picture and locate all blocks.   Sit<>stand at rial in hall x 4 with mod assist from PT. Gait training from PT at rail with max assist+2 for management of LLE as well as postural control. Visual feed back from mirror and max cues for seqeuncing and attention to the LLE. Intermittent pushers syndrome limiting step length on the RLE, but greatly improved from  prior PT sessions.   WC mobility x 1048fto room with hemitechnique. Cues from PT for sequencing and improved of RLE to prevent veer to the L and avoidance of obstacles in hall., [facility] and all needs met.         Therapy Documentation Precautions:  Precautions Precautions: Fall, Other (comment) Precaution Comments: L hemiplegia, Bil BKA 2 years ago and 1.5 years ago Required Braces or Orthoses: Other Brace Other Brace: bil prosthetic Restrictions Weight Bearing Restrictions: No  Vital Signs: Therapy Vitals Temp: 98.4 F (36.9 C) Pulse Rate: 69 Resp: 14 BP: (!) 144/79 Patient Position (if appropriate): Lying Oxygen Therapy SpO2: 97 % O2 Device: Room Air Pain:  denies   Therapy/Group: Individual Therapy  AuLorie Phenix/24/2023, 8:01 AM

## 2022-04-06 NOTE — Progress Notes (Addendum)
Received patient in bed to unit.  Alert and oriented.  Informed consent signed and in chart.   Treatment initiated: 1430 Treatment completed: 1740  Patient tolerated well. Very pleasant and cooperative. Transported back to the room  Alert, without acute distress.  Hand-off given to patient's nurse.   Access used: L arm AVgraft Access issues: none  Total UF removed: 1L Medication(s) given: n/a Post HD VS: 144/47,73,19,98.0,100% Post HD weight: 101.2kg   [facility]

## 2022-04-06 NOTE — [place] FL2 (Deleted)
[place] MEDICAID FL2 LEVEL OF CARE SCREENING TOOL     IDENTIFICATION  Patient Name: Cindy Osborne Birthdate: 1965/08/17 Sex: female Admission Date (Current Location): 03/25/2022  [facility] and [place] Number:  Herbalist and Address:  [facility] [place], [place] [address] [place], [place] [address]      Provider Number: [id number]  Attending Physician Name and Address:  Charlett Blake, MD  Relative Name and Phone Number:  Contact pt sister [name] [phone]    Current Level of Care: Hospital Recommended Level of Care: [place] Prior Approval Number:    Date Approved/Denied:   PASRR Number:    Discharge Plan: SNF    Current Diagnoses: Patient Active Problem List   Diagnosis Date Noted   Pressure injury of skin 04/05/2022   Acute ischemic right MCA stroke ([place]) 03/25/2022   Stroke ([place]) 03/21/2022   Stroke (cerebrum) ([place]) 03/21/2022   Hyperlipidemia 01/24/2022   Obesity 01/24/2022   Acute right arterial ischemic stroke, MCA (middle cerebral artery) ([place]) 01/21/2022   Combined forms of age-related cataract of both eyes 06/30/2021   Diabetic macular edema ([place]) 06/30/2021   Proteinuria 95/18/8416   Metabolic acidosis 60/63/0160   Encounter for insertion of mirena IUD 03/15/2021   Panic disorder 03/03/2021   Acquired absence of left leg below knee ([place]) 10/26/2020   PVD (peripheral vascular disease) ([place]) 07/28/2020   Uncontrolled type 2 diabetes mellitus with diabetic peripheral angiopathy without gangrene 02/26/2020   Acquired absence of right leg below knee ([place]) 07/28/2019   Diabetic retinopathy ([place]) 11/23/2016   Slow transit constipation 09/24/2015   Environmental and seasonal allergies 07/09/2015   Anemia associated with chronic renal failure 07/09/2015   ESRD (end stage renal disease) on dialysis ([place]) 07/09/2015   Type II diabetes mellitus with complication ([place]) 10/93/2355   Herpes simplex  infection 07/09/2015   Hyperlipidemia associated with type 2 diabetes mellitus ([place]) 07/09/2015   Heart & renal disease, hypertensive, with heart failure ([place]) 07/09/2015   Arthritis of knee, degenerative 07/09/2015   Tendinitis 07/09/2015   Avitaminosis D 07/09/2015   Obstructive apnea 12/15/2014   Intracervical pessary 01/07/2014   Essential hypertension 09/11/2013    Orientation RESPIRATION BLADDER Height & Weight     Self, Time, Situation, Place  Normal Incontinent Weight: 228 lb 9.9 oz (103.7 kg) Height:  5\' 9"  (175.3 cm)  BEHAVIORAL SYMPTOMS/MOOD NEUROLOGICAL BOWEL NUTRITION STATUS      Incontinent Diet (D3 thin)  AMBULATORY STATUS COMMUNICATION OF NEEDS Skin   Limited Assist Verbally Normal                       Personal Care Assistance Level of Assistance  Bathing, Feeding, Dressing Bathing Assistance: Limited assistance Feeding assistance: Limited assistance Dressing Assistance: Limited assistance     Functional Limitations Info  Sight, Hearing, Speech Sight Info: Adequate Hearing Info: Adequate Speech Info: Adequate    SPECIAL CARE FACTORS FREQUENCY  PT (By licensed PT), OT (By licensed OT), Speech therapy     PT Frequency: 5xs per week OT Frequency: 5xs per week     Speech Therapy Frequency: 5xs per week      Contractures Contractures Info: Not present    Additional Factors Info  Code Status, Allergies, Insulin Sliding Scale Code Status Info: Full Allergies Info: See discharge instructions   Insulin Sliding Scale Info: See d/c instructions       Current Medications (04/06/2022):  This is the current hospital  active medication list Current Facility-Administered Medications  Medication Dose Route Frequency Provider Last Rate Last Admin   acetaminophen (TYLENOL) tablet 325-650 mg  325-650 mg Oral Q4H PRN Bary Leriche, PA-C   650 mg at 04/03/22 1543   amitriptyline (ELAVIL) tablet 25 mg  25 mg Oral QHS Love, Pamela S, PA-C   25 mg at 04/05/22  2147   apixaban (ELIQUIS) tablet 5 mg  5 mg Oral BID Love, Pamela S, PA-C   5 mg at 04/06/22 0750   bisacodyl (DULCOLAX) suppository 10 mg  10 mg Rectal Daily PRN Bary Leriche, PA-C       diphenhydrAMINE (BENADRYL) 12.5 MG/5ML elixir 12.5-25 mg  12.5-25 mg Oral Q6H PRN Bary Leriche, PA-C   25 mg at 04/02/22 2133   guaiFENesin-dextromethorphan (ROBITUSSIN DM) 100-10 MG/5ML syrup 5-10 mL  5-10 mL Oral Q6H PRN Bary Leriche, PA-C       heparin sodium (porcine) 1000 UNIT/ML injection            insulin aspart (novoLOG) injection 0-5 Units  0-5 Units Subcutaneous QHS Bary Leriche, PA-C   2 Units at 04/05/22 2147   insulin aspart (novoLOG) injection 0-6 Units  0-6 Units Subcutaneous TID WC Bary Leriche, PA-C   1 Units at 04/05/22 1815   liver oil-zinc oxide (DESITIN) 40 % ointment   Topical BID Charlett Blake, MD   Given at 04/06/22 1216   metoprolol succinate (TOPROL-XL) 24 hr tablet 200 mg  200 mg Oral Daily Bary Leriche, PA-C   200 mg at 04/06/22 0750   Oral care mouth rinse  15 mL Mouth Rinse PRN Kirsteins, Luanna Salk, MD       polyethylene glycol (MIRALAX / GLYCOLAX) packet 17 g  17 g Oral Daily PRN Bary Leriche, PA-C   17 g at 03/29/22 0735   pravastatin (PRAVACHOL) tablet 40 mg  40 mg Oral q1800 Bary Leriche, PA-C   40 mg at 04/05/22 1815   prochlorperazine (COMPAZINE) tablet 5-10 mg  5-10 mg Oral Q6H PRN Reesa Chew S, PA-C       Or   prochlorperazine (COMPAZINE) injection 5-10 mg  5-10 mg Intramuscular Q6H PRN Love, Pamela S, PA-C       Or   prochlorperazine (COMPAZINE) suppository 12.5 mg  12.5 mg Rectal Q6H PRN Love, Pamela S, PA-C       traMADol (ULTRAM) tablet 25-50 mg  25-50 mg Oral Q6H PRN Love, Pamela S, PA-C   50 mg at 04/05/22 1401     Discharge Medications: Please see discharge summary for a list of discharge medications.  Relevant Imaging Results:  Relevant Lab Results:   Additional Information [id number]; Dialysis TTS witrh 115 chair time.  Rana Snare, LCSW

## 2022-04-07 DIAGNOSIS — Z794 Long term (current) use of insulin: Secondary | ICD-10-CM

## 2022-04-07 DIAGNOSIS — R1312 Dysphagia, oropharyngeal phase: Secondary | ICD-10-CM

## 2022-04-07 LAB — GLUCOSE, CAPILLARY
Glucose-Capillary: 157 mg/dL — ABNORMAL HIGH (ref 70–99)
Glucose-Capillary: 157 mg/dL — ABNORMAL HIGH (ref 70–99)
Glucose-Capillary: 160 mg/dL — ABNORMAL HIGH (ref 70–99)
Glucose-Capillary: 121 mg/dL — ABNORMAL HIGH (ref 70–99)

## 2022-04-07 MED ORDER — CHLORHEXIDINE GLUCONATE CLOTH 2 % EX PADS
6.0000 | MEDICATED_PAD | Freq: Every day | CUTANEOUS | Status: DC
  Administered 2022-04-08 – 2022-04-09 (×2): 6 via TOPICAL

## 2022-04-07 NOTE — Progress Notes (Signed)
Physical Therapy Session Note  Patient Details  Name: Cindy Osborne MRN: [id number] Date of Birth: 07/10/1966  Today's Date: 04/07/2022 PT Individual Time: 1330-1425 PT Individual Time Calculation (min): 55 min   Short Term Goals: Week 1:  PT Short Term Goal 1 (Week 1): Patient will perform bed mobility with min A with our without use of bed rails. PT Short Term Goal 1 - Progress (Week 1): Met PT Short Term Goal 2 (Week 1): Patient will perform basic transfers with mod A >50% of the time. PT Short Term Goal 2 - Progress (Week 1): Met PT Short Term Goal 3 (Week 1): Patient will perform sit to stand with max A using LRAD. PT Short Term Goal 3 - Progress (Week 1): Met PT Short Term Goal 4 (Week 1): Patient will initiate w/c mobility. PT Short Term Goal 4 - Progress (Week 1): Met Week 2:  PT Short Term Goal 1 (Week 2): Patient will perform basic transfers with min A using LRAD. PT Short Term Goal 2 (Week 2): Patient will perform sit to stand with mod A using LRAD. PT Short Term Goal 3 (Week 2): Patient will initiate gait training. PT Short Term Goal 4 (Week 2): Patient will propel w/c >50 ft with min A. Week 3:     Skilled Therapeutic Interventions/Progress Updates:    Wc propulsion w/bilat feet w/heavy cueing to incorporate LLE x 41fet  Sit to stnd from wc to kitchen counter w/mod assist of 2.  Pt engaged in overhead reaching activity in standing moving dishes in and out of cabinet to progressively higher shelves to promote extension, mod assist for balance due to L lean.  Repeated approx 5 min x3 w/several min seated rest required,  third trial limited by L hip pain.  Seated at table pt worked on L[employer] rolling ball, bolster, and hand on towel to perform shoulder and elbow flex/ext, shoulder IR/ER/abd/add and opening/closingin fingers limited ROM L.  [name].   [name]   Therapy  Documentation Precautions:  Precautions Precautions: Fall, Other (comment) Precaution Comments: L hemiplegia, Bil BKA 2 years ago and 1.5 years ago Required Braces or Orthoses: Other Brace Other Brace: bil prosthetic Restrictions Weight Bearing Restrictions: No     Therapy/Group: Individual Therapy'Quin Mcpherson GErnst Bowler PT   BJerrilyn Cairo8/25/2023, 2:29 PM

## 2022-04-07 NOTE — Progress Notes (Signed)
  [facility] Progress Note   Subjective:  patient seen in room. No sob or leg swelling.   Objective Vitals:   04/06/22 1834 04/06/22 1932 04/07/22 0500 04/07/22 0506  BP: 129/77 106/77  128/74  Pulse: 73 76  80  Resp: 17 16  18   Temp: 98.6 F (37 C) 98 F (36.7 C)  98.5 F (36.9 C)  TempSrc:      SpO2: 100% 99%  100%  Weight:   102.6 kg   Height:       Physical Exam General: NAD Heart: RRR, no murmurs, rubs or gallops Lungs: CTA bilaterally Abdomen: obese, soft, non-distended Extremities: No edema b/l lower extremities Neuro: awake, alert Dialysis Access:  LUE AVF + bruit  OP HD: [facility] ([facility] [phone])   4h  104.3kg  400/600  2/2.5 bath  Heparin 2000 LUA AVF  hep B 8/08 - mircera 150 ug q2, last 8/5, last Hb 9.7 - calcitriol 0.5 ug tiw - sensipar 90 po tiw    Assessment/Plan: Debility - post CVA, on CIR now.  Acute CVA - sp intervention 8/8. Was here in June for similar event. Deficits improving some.  pAFib - seen by cardiology and getting metoprolol 200mg  daily & eliquis.  Acute resp failure w/ hypoxemia - resolved  ESRD - on HD TTS. HD tomorrow.  HTN/Volume - BPs low-normal and pt is below dry wt. Metoprolol resumed per cardiology. Cont UF 1.5- 2 L w/ HD.  Anemia of CKD - Hgb 11.5 8/15.  Esa last given 8/5, no esa needs.  8.   MBD ckd - CCa high, vdra on hold. Sensipar resumed. Phos 7, resumed home velphoro 3 ac tid.   [name], MD 04/07/2022, 3:22 PM     Recent Labs  Lab 04/04/22 1250  HGB 11.2*  ALBUMIN 3.1*  CALCIUM 9.6  PHOS 7.0*  CREATININE 9.24*  K 4.1    Inpatient medications:  amitriptyline  25 mg Oral QHS   apixaban  5 mg Oral BID   insulin aspart  0-5 Units Subcutaneous QHS   insulin aspart  0-6 Units Subcutaneous TID WC   liver oil-zinc oxide   Topical BID   metoprolol succinate  200 mg Oral Daily   pravastatin  40 mg Oral q1800    acetaminophen, bisacodyl, diphenhydrAMINE,  guaiFENesin-dextromethorphan, mouth rinse, polyethylene glycol, prochlorperazine **OR** prochlorperazine **OR** prochlorperazine, traMADol

## 2022-04-07 NOTE — Progress Notes (Signed)
PROGRESS NOTE   Subjective/Complaints:  No new complaints or concerns this morning.  She would like to [employer] to spend some time with her family will be likely visiting today.  ROS: Patient denies CP, SOB, N/V/D, abdominal pain Objective:   No results found. Recent Labs    04/04/22 1250  WBC 7.1  HGB 11.2*  HCT 34.6*  PLT 263    Recent Labs    04/04/22 1250  NA 130*  K 4.1  CL 92*  CO2 23  GLUCOSE 208*  BUN 65*  CREATININE 9.24*  CALCIUM 9.6     Intake/Output Summary (Last 24 hours) at 04/07/2022 1110 Last data filed at 04/07/2022 0748 Gross per 24 hour  Intake 236 ml  Output 1 ml  Net 235 ml          Physical Exam: Vital Signs Blood pressure 128/74, pulse 80, temperature 98.5 F (36.9 C), resp. rate 18, height 5\' 9"  (1.753 m), weight 102.6 kg, SpO2 100 %.   General: No acute distress Mood and affect are appropriate Heart: Regular rate and rhythm no rubs murmurs or extra sounds Lungs: Clear to auscultation, breathing unlabored, no rales or wheezes, good air movement Abdomen: Positive bowel sounds, soft nontender to palpation, nondistended Extremities: No clubbing, cyanosis, or edema Left arm AV graft  Skin: small left 1x1.5cm buttocks stage 2 as above no drainage or odor Neurologic: Cranial nerves II through XII intact, motor strength is 5/5 in right deltoid, bicep, tricep, grip, hip flexor, knee extensors, 1-2/5 left delt, biceps, 2-triceps and finger flexors, 1/5 hip flexors and 3 knee flexors and extensors--no changes today Sensory exam normal sensation to light touch and proprioception in right upper and lower extremities, absent LT sensation LUE, intact LLE Cerebellar exam normal finger to nose to finger as well as heel to shin in bilateral upper and lower extremities Musculoskeletal: no pain with left hip ROM, no tenderness to palpation    Assessment/Plan: 1. Functional deficits  which require 3+ hours per day of interdisciplinary therapy in a comprehensive inpatient rehab setting. Physiatrist is providing close team supervision and 24 hour management of active medical problems listed below. Physiatrist and rehab team continue to assess barriers to discharge/monitor patient progress toward functional and medical goals  Care Tool:  Bathing    Body parts bathed by patient: Left arm, Chest, Abdomen, Right upper leg, Face   Body parts bathed by helper: Front perineal area, Buttocks, Right arm Body parts n/a: Left lower leg, Right lower leg   Bathing assist Assist Level: Maximal Assistance - Patient 24 - 49%     Upper Body Dressing/Undressing Upper body dressing   What is the patient wearing?: Pull over shirt    Upper body assist Assist Level: Moderate Assistance - Patient 50 - 74%    Lower Body Dressing/Undressing Lower body dressing      What is the patient wearing?: Pants, Underwear/pull up     Lower body assist Assist for lower body dressing: Moderate Assistance - Patient 50 - 74%     Toileting Toileting    Toileting assist Assist for toileting: Dependent - Patient 0%     Transfers Chair/bed transfer  Transfers assist  Chair/bed transfer activity did not occur: Safety/medical concerns  Chair/bed transfer assist level: 2 Helpers     Locomotion Ambulation   Ambulation assist   Ambulation activity did not occur: Safety/medical concerns          Walk 10 feet activity   Assist  Walk 10 feet activity did not occur: Safety/medical concerns        Walk 50 feet activity   Assist Walk 50 feet with 2 turns activity did not occur: Safety/medical concerns         Walk 150 feet activity   Assist Walk 150 feet activity did not occur: Safety/medical concerns         Walk 10 feet on uneven surface  activity   Assist Walk 10 feet on uneven surfaces activity did not occur: Safety/medical concerns          Wheelchair     Assist Is the patient using a wheelchair?: Yes Type of Wheelchair: Manual    Wheelchair assist level: Dependent - Patient 0% Max wheelchair distance: >150 feet    Wheelchair 50 feet with 2 turns activity    Assist        Assist Level: Dependent - Patient 0%   Wheelchair 150 feet activity     Assist      Assist Level: Dependent - Patient 0%   Blood pressure 128/74, pulse 80, temperature 98.5 F (36.9 C), resp. rate 18, height 5\' 9"  (1.753 m), weight 102.6 kg, SpO2 100 %.  Medical Problem List and Plan: 1. Functional deficits secondary to RIght MCA cardioembolic infarct complicated by prior hx B BKA             -patient may  shower             -ELOS/Goals: MinA mobility and ADL, use R BKA prothetic to transfer to [facility] ,  Not sure whether electric WC will be a safe choice due to Left neglect  Discussed SNF option, pt wishes to pursue this, discussed that search should start due to limited SNFs that accept HD pts and that if bed becomes available prior to 9/4 , that she may be transferred then .  Social work is working on [employer] options  -Continue CIR therapies including PT, OT, and SLP   [employer] ordered 2.  Antithrombotics: -DVT/anticoagulation:  Pharmaceutical: Eliquis             -antiplatelet therapy: N/A 3. Pain Management: Tylenol prn.  4. Mood/Behavior/Sleep: LCSW to follow for evaluation and support             -continue Elavil at bedtime for insomnia.              -antipsychotic agents: N/A 5. Neuropsych/cognition: This patient is capable of making decisions on her own behalf. 6. Skin/Wound Care: Gr 2 left buttocks no pain or drainage, add desitin BID , roll q 2 h 7. Fluids/Electrolytes/Nutrition: Strict I/O. Add ed1200 cc FR/day.             --daily weights.  8. New onset A fib w/RVR: Monitor HR TID continue Eliquis. 9. T2DM: Hgb A1C- 6.0 and well controlled. Was on insulin Glargine 18 units with 5 units TID meal coverage.  CBG (last  3)  Recent Labs    04/06/22 1827 04/06/22 2033 04/07/22 0624  GLUCAP 139* 196* 157*    Borderline control sees Endo as OP, discussed that she is currently managed with diet only and doing reasonably well,  if her diet doesn't change at home she may be managed with oral agent ,Tradjenta may be tried in hospital , pt would like to think about it  -CBG's continue to be reasonably well controlled 8/25, continue to monitor 10. ESRD: HD TTS at the end of the day to help with tolerance of therapy during the day.  Appreciate Nephro assistance - discussed with Nephro, no vascular access issues   -Hemodialysis completed yesterday 8/25 11. Small thyroid nodule: Follow up with PCP after discharge for further work up.  12.  Patchy/nodular chest densities: CT chest recommended for follow-up 13. OSA: Continue BIPAP when asleep.   100.  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  Fall PTA with Left hip and shoulder pain check xrays although exam appears mainly consistent with post CVA shoulder pain as well as Left hip troch bursitis  8/14-mild to moderate OA visible on xrays of left shoulder and pelvis    -add voltaren gel to shoulder and hip pain 16.Dysphagia- adv per SLP. Seems to be tolerating diet well  -ON dys3 diet with thin liquids   LOS:  13 days A FACE TO FACE EVALUATION WAS PERFORMED  Jennye Boroughs 04/07/2022, 11:10 AM

## 2022-04-07 NOTE — Progress Notes (Signed)
Physical Therapy Session Note  Patient Details  Name: Cindy Osborne MRN: [id number] Date of Birth: 04/11/1966  Today's Date: 04/07/2022 PT Individual Time: 0950-1030 PT Individual Time Calculation (min): 40 min   Short Term Goals: Week 1:  PT Short Term Goal 1 (Week 1): Patient will perform bed mobility with min A with our without use of bed rails. PT Short Term Goal 1 - Progress (Week 1): Met PT Short Term Goal 2 (Week 1): Patient will perform basic transfers with mod A >50% of the time. PT Short Term Goal 2 - Progress (Week 1): Met PT Short Term Goal 3 (Week 1): Patient will perform sit to stand with max A using LRAD. PT Short Term Goal 3 - Progress (Week 1): Met PT Short Term Goal 4 (Week 1): Patient will initiate w/c mobility. PT Short Term Goal 4 - Progress (Week 1): Met Week 2:  PT Short Term Goal 1 (Week 2): Patient will perform basic transfers with min A using LRAD. PT Short Term Goal 2 (Week 2): Patient will perform sit to stand with mod A using LRAD. PT Short Term Goal 3 (Week 2): Patient will initiate gait training. PT Short Term Goal 4 (Week 2): Patient will propel w/c >50 ft with min A. Week 3:     Skilled Therapeutic Interventions/Progress Updates:   Pt received supine in bed and agreeable to PT. Supine>sit transfer with min assist and cues        Therapy Documentation Precautions:  Precautions Precautions: Fall, Other (comment) Precaution Comments: L hemiplegia, Bil BKA 2 years ago and 1.5 years ago Required Braces or Orthoses: Other Brace Other Brace: bil prosthetic Restrictions Weight Bearing Restrictions: No General:   Vital Signs: Therapy Vitals Temp: 98.6 F (37 C) Temp Source: Oral Pulse Rate: 71 Resp: 15 BP: 131/83 Patient Position (if appropriate): Sitting Oxygen Therapy SpO2: 100 % O2 Device: Room Air Pain:   Mobility:   Locomotion :    Trunk/Postural Assessment :    Balance:   Exercises:   Other Treatments:       Therapy/Group: Individual Therapy  Cindy Osborne 04/07/2022, 4:02 PM

## 2022-04-07 NOTE — Progress Notes (Signed)
Speech Language Pathology Daily Session Note  Patient Details  Name: Cindy Osborne MRN: [id number] Date of Birth: 04/16/1966  Today's Date: 04/07/2022 SLP Individual Time: 1106-1205 SLP Individual Time Calculation (min): 59 min  Short Term Goals: Week 2: SLP Short Term Goal 1 (Week 2): Pt will consume current diet with minimal overt s/sx of aspiration with sup A verbal cues to reduce rate of consumption, bite sizes, L pocketing/anterior spillage SLP Short Term Goal 2 (Week 2): Patient will implement speech intelligibility strategies at the sentence level with with sup A verbal cues to monitor and self correct SLP Short Term Goal 3 (Week 2): Patient will utilize internal/external memory strategies to recall novel information with min A verbal cues SLP Short Term Goal 4 (Week 2): Pt will demonstrate awareness of errors with min-to-mod A verbal/visual cues SLP Short Term Goal 5 (Week 2): Pt will complete mildly complex problem solving with min-to-mod A verbal/visual cues  Skilled Therapeutic Interventions: Skilled ST treatment focused on cognitive and speech goals. SLP facilitated session by providing sup A verbal cues for short-term recall in order to recall and locate novel list of items in hospital gift shop. With use of external aids, pt recalled 4/5 items following 5 minute delay with sup A verbal cues, 3/5 following 20 minute delay given min A verbal cues. Pt located items throughout gift shop with min A verbal cues for visual scanning, and min A verbal redirection cues to persist to task due to frequency of side conversation. Pt engaged in functional discussion re: awareness of deficits s/p CVA and pt exhibited increased intellectual and emergent awareness. SLP addressed speech goals by providing sup A verbal cues to implement speech intelligibility strategies (I.e., slow down, pause between words, over-articulate). at the conversation level. Patient was left in wheelchair with alarm activated  and immediate needs within reach at end of session. Continue per current plan of care.      Pain  None/denied  Therapy/Group: Individual Therapy  Patty Sermons 04/07/2022, 12:34 PM

## 2022-04-07 NOTE — Progress Notes (Signed)
Occupational Therapy Session Note  Patient Details  Name: Cindy Osborne MRN: [id number] Date of Birth: 05-31-1966  Today's Date: 04/07/2022 OT Individual Time: 0830-0930 OT Individual Time Calculation (min): 60 min    Short Term Goals: Week 2:  OT Short Term Goal 1 (Week 2): Patient demonstrated impoved awareness of L UE by positioning prior to transfer with min verbal cues. OT Short Term Goal 2 (Week 2): Patient will recall hemi dressing techniques with min cues OT Short Term Goal 3 (Week 2): Patient will perform one step of toileting task  Skilled Therapeutic Interventions/Progress Updates:    Upon OT arrival, pt supine with nurse present completing peri care. Pt reports no pain and is agreeable to OT treatment. OT intervention with a focus on self care retraining, hemi dressing, and functional transfers. Pt was provided with AE including a reacher to perform LB dressing bed level. Pt doff/donns underwear and shorts with Mod A requiring increased time, verbal cues and manual assist for rolling in bed with Mod-Max A. Pt completes supine to sit transfer with Mod A and sits EOB with CGA to donn prostheses with Max A. Attempted to transfer patient into w/c but arm rest would not remove. Pt completes 3 sit<>stand transfers with Clarise Cruz stedy and Min A and was transported higher in bed for appropriate positioning. Pt completes sit to supine with Max A. L UE was elevated onto pillow and pt left on her R side in bed. Pt with all needs met at end of session.   Therapy Documentation Precautions:  Precautions Precautions: Fall, Other (comment) Precaution Comments: L hemiplegia, Bil BKA 2 years ago and 1.5 years ago Required Braces or Orthoses: Other Brace Other Brace: bil prosthetic Restrictions Weight Bearing Restrictions: No   Therapy/Group: Individual Therapy  Cindy Osborne 04/07/2022, 10:30 AM

## 2022-04-07 NOTE — Progress Notes (Addendum)
Patient ID: Cindy Osborne, female   DOB: 1966/04/21, 56 y.o.   MRN: [id number]  SW returned phone call to [name] [phone]) and unable to leave message as voicemail is full.  *SW spoke with Cindy Osborne to discuss SNF options. Would like SW to explore [facility] and if they can transport pt to dialysis.   SW left Tammy/Admissions with [facility] [phone]) to discuss bed offer, and if they are able to transport to dialysis. SW waiting on follow-up.  SW received phone call from [name] reporting pt mother and sister remain in agreement with [facility] after their visit today. SW shared will follow-up once there is more information on bed offer.   SW left message for Tammy/[facility] (p:[phone]/cell:[phone]) again to follow-up about referral and waiting on follow-up.   Loralee Pacas, MSW, [place] Office: [phone] Cell: [phone] Fax: [phone]

## 2022-04-08 LAB — GLUCOSE, CAPILLARY
Glucose-Capillary: 139 mg/dL — ABNORMAL HIGH (ref 70–99)
Glucose-Capillary: 152 mg/dL — ABNORMAL HIGH (ref 70–99)
Glucose-Capillary: 107 mg/dL — ABNORMAL HIGH (ref 70–99)
Glucose-Capillary: 212 mg/dL — ABNORMAL HIGH (ref 70–99)

## 2022-04-08 MED ORDER — HEPARIN SODIUM (PORCINE) 1000 UNIT/ML IJ SOLN
INTRAMUSCULAR | Status: AC
  Administered 2022-04-08: 2000 [IU]
  Filled 2022-04-08: qty 2

## 2022-04-08 NOTE — Progress Notes (Signed)
Speech Language Pathology Daily Session Note  Patient Details  Name: Maryl Blalock MRN: [id number] Date of Birth: [birth year]  Today's Date: 04/08/2022 SLP Individual Time: 8841-6606 SLP Individual Time Calculation (min): 40 min  Short Term Goals: Week 2: SLP Short Term Goal 1 (Week 2): Pt will consume current diet with minimal overt s/sx of aspiration with sup A verbal cues to reduce rate of consumption, bite sizes, L pocketing/anterior spillage SLP Short Term Goal 2 (Week 2): Patient will implement speech intelligibility strategies at the sentence level with with sup A verbal cues to monitor and self correct SLP Short Term Goal 3 (Week 2): Patient will utilize internal/external memory strategies to recall novel information with min A verbal cues SLP Short Term Goal 4 (Week 2): Pt will demonstrate awareness of errors with min-to-mod A verbal/visual cues SLP Short Term Goal 5 (Week 2): Pt will complete mildly complex problem solving with min-to-mod A verbal/visual cues  Skilled Therapeutic Interventions:  Pt was seen for skilled ST targeting cognitive goals.  Upon arrival, pt was in her wheelchair, awake, alert, and agreeable to participating in treatment.  SLP facilitated the session with a novel grocery shopping task to address visual scanning and problem solving.  Pt needed intermittent mod assist verbal cues for use of a marginal anchor and finger tracking to facilitate improve visual scanning when looking through a grocery store flyer to locate targeted items to the left of midline.  Pt could alternate her attention between conversations with therapist and looking for items with min cues for redirection.  Pt could then freely recall specific steps and ingredients of multiple familiar recipes during informal conversations with SLP with supervision question cues.  Pt also recalled at least 3 specific details from AM PT therapy session with supervision question cues.  Pt was left in wheelchair  with chair alarm set and call bell within reach.  Continue per current plan of care.    Pain Pain Assessment Pain Scale: 0-10 Pain Score: 0-No pain  Therapy/Group: Individual Therapy  Terrisa Curfman, Selinda Orion 04/08/2022, 11:41 AM

## 2022-04-08 NOTE — Progress Notes (Signed)
  [facility] Progress Note   Subjective:  patient seen in room. No sob or leg swelling.   Objective Vitals:   04/08/22 1630 04/08/22 1700 04/08/22 1724 04/08/22 1841  BP: 127/65 123/71 (!) 121/99 98/82  Pulse: 72 72 74 75  Resp: (!) 24 (!) 22 17 17   Temp:   98.4 F (36.9 C)   TempSrc:      SpO2: 96% 95% 98% 99%  Weight:   101.3 kg   Height:       Physical Exam General: NAD Heart: RRR, no murmurs, rubs or gallops Lungs: CTA bilaterally Abdomen: obese, soft, non-distended Extremities: No edema b/l lower extremities Neuro: awake, alert Dialysis Access:  LUE AVF + bruit  OP HD: [facility] ([facility] [phone])   4h  104.3kg  400/600  2/2.5 bath  Heparin 2000 LUA AVF  hep B 8/08 - mircera 150 ug q2, last 8/5, last Hb 9.7 - calcitriol 0.5 ug tiw - sensipar 90 po tiw    Assessment/Plan: Debility - post CVA, on CIR now.  Acute CVA - sp intervention 8/8. Was here in June for similar event. Deficits improving some.  pAFib - seen by cardiology and getting metoprolol 200mg  daily & eliquis.  Acute resp failure w/ hypoxemia - resolved  ESRD - on HD TTS. HD tomorrow.  HTN/Volume - BPs low-normal and pt is below dry wt. Metoprolol resumed per cardiology. Cont UF 1-1.5 L next HD.  Anemia of CKD - Hgb 11.5 8/15.  Esa last given 8/5, no esa needs.  8.   MBD ckd - CCa high, vdra on hold. Sensipar resumed. Phos 7, resumed home velphoro 3 ac tid.   [name], MD 04/08/2022, 11:05 PM     Recent Labs  Lab 04/04/22 1250  HGB 11.2*  ALBUMIN 3.1*  CALCIUM 9.6  PHOS 7.0*  CREATININE 9.24*  K 4.1    Inpatient medications:  amitriptyline  25 mg Oral QHS   apixaban  5 mg Oral BID   Chlorhexidine Gluconate Cloth  6 each Topical Q0600   insulin aspart  0-5 Units Subcutaneous QHS   insulin aspart  0-6 Units Subcutaneous TID WC   liver oil-zinc oxide   Topical BID   metoprolol succinate  200 mg Oral Daily   pravastatin  40 mg Oral q1800    acetaminophen,  bisacodyl, diphenhydrAMINE, guaiFENesin-dextromethorphan, mouth rinse, polyethylene glycol, prochlorperazine **OR** prochlorperazine **OR** prochlorperazine, traMADol

## 2022-04-08 NOTE — Progress Notes (Signed)
Physical Therapy Session Note  Patient Details  Name: Henritta Mutz MRN: [id number] Date of Birth: 1965/10/19  Today's Date: 04/08/2022 PT Individual Time: 0800-0846 PT Individual Time Calculation (min): 46 min   Short Term Goals: Week 1:  PT Short Term Goal 1 (Week 1): Patient will perform bed mobility with min A with our without use of bed rails. PT Short Term Goal 1 - Progress (Week 1): Met PT Short Term Goal 2 (Week 1): Patient will perform basic transfers with mod A >50% of the time. PT Short Term Goal 2 - Progress (Week 1): Met PT Short Term Goal 3 (Week 1): Patient will perform sit to stand with max A using LRAD. PT Short Term Goal 3 - Progress (Week 1): Met PT Short Term Goal 4 (Week 1): Patient will initiate w/c mobility. PT Short Term Goal 4 - Progress (Week 1): Met Week 2:  PT Short Term Goal 1 (Week 2): Patient will perform basic transfers with min A using LRAD. PT Short Term Goal 2 (Week 2): Patient will perform sit to stand with mod A using LRAD. PT Short Term Goal 3 (Week 2): Patient will initiate gait training. PT Short Term Goal 4 (Week 2): Patient will propel w/c >50 ft with min A. Week 3:     Skilled Therapeutic Interventions/Progress Updates:   Pt received sitting in WC and agreeable to PT  Sit<>stand to from EOB with stedy  Oral care at sink  Tranfers to and from [facility] on slide board.   Nustep.   Patient returned to room and left sitting in [facility] with call bell in reach and all needs met.        Therapy Documentation Precautions:  Precautions Precautions: Fall, Other (comment) Precaution Comments: L hemiplegia, Bil BKA 2 years ago and 1.5 years ago Required Braces or Orthoses: Other Brace Other Brace: bil prosthetic Restrictions Weight Bearing Restrictions: No General:   Vital Signs:   Pain:   Mobility:   Locomotion :    Trunk/Postural Assessment :    Balance:   Exercises:   Other Treatments:      Therapy/Group: Individual  Therapy  Lorie Phenix 04/08/2022, 8:49 AM

## 2022-04-09 DIAGNOSIS — I4891 Unspecified atrial fibrillation: Secondary | ICD-10-CM

## 2022-04-09 LAB — GLUCOSE, CAPILLARY
Glucose-Capillary: 141 mg/dL — ABNORMAL HIGH (ref 70–99)
Glucose-Capillary: 139 mg/dL — ABNORMAL HIGH (ref 70–99)
Glucose-Capillary: 240 mg/dL — ABNORMAL HIGH (ref 70–99)
Glucose-Capillary: 177 mg/dL — ABNORMAL HIGH (ref 70–99)

## 2022-04-09 MED ORDER — LINAGLIPTIN 5 MG PO TABS
5.0000 mg | ORAL_TABLET | Freq: Every day | ORAL | Status: DC
  Administered 2022-04-09 – 2022-04-10 (×2): 5 mg via ORAL
  Filled 2022-04-09 (×4): qty 1

## 2022-04-09 NOTE — Progress Notes (Signed)
Occupational Therapy Session Note  Patient Details  Name: Cindy Osborne MRN: [id number] Date of Birth: 09/04/1965  Today's Date: 04/09/2022 OT Individual Time: 1300-1345 OT Individual Time Calculation (min): 45 min    Short Term Goals: Week 2:  OT Short Term Goal 1 (Week 2): Patient demonstrated impoved awareness of L UE by positioning prior to transfer with min verbal cues. OT Short Term Goal 2 (Week 2): Patient will recall hemi dressing techniques with min cues OT Short Term Goal 3 (Week 2): Patient will perform one step of toileting task  Skilled Therapeutic Interventions/Progress Updates: Patient progressing towards goals, During toileting task she was able to manage LUE onto the lift with only one cue of the 5 sit to stands. She was also able to adjust clothing in front when pulling clothing up from toileting. Patient attempted to perform pericare, but required assistance.     Therapy Documentation Precautions:  Precautions Precautions: Fall, Other (comment) Precaution Comments: L hemiplegia, Bil BKA 2 years ago and 1.5 years ago Required Braces or Orthoses: Other Brace Other Brace: bil prosthetic Restrictions Weight Bearing Restrictions: No General: General PT Missed Treatment Reason: Patient fatigue Vital Signs: Therapy Vitals Temp: 98.5 F (36.9 C) Pulse Rate: 68 Resp: 15 BP: 111/63 Patient Position (if appropriate): Sitting Oxygen Therapy SpO2: 98 % O2 Device: Room Air Pain:   ADL: ADLAssisted patient with grooming and toileting as listed below. Patient needed cues and assist to perform hair care. Significant knotting in the back of her hair required assistance to detangle and to place hair in a pony tail. Patient reports her mother can continue to assist with detangling and hair care.  Grooming: Moderate assistance (Where Assessed-Grooming: Sitting at sink, Wheelchair.  Toileting: Total Assist, Patient able to perform clothing management in the front while  standing with the sara lift. Unable to perform pericare seated on BSC and not able to safely balance in standing to perform pericare. Where Assessed-Toileting:  Bariatric BSC over toilet Toilet Transfer: Dependent; utilized sara lift, patient able to stand with BUE's on the support bar and minimal to moderate therapist assist into standing. Toilet Transfer Method:  (SARA lift) Science writer: Extra wide bedside commode (placed over the toilet)  Perception Continue to have diminished awareness of the LUE, but is doing well with compensatory strategies to position LUE and keep it with in her visual field      Therapy/Group: Individual Therapy  Hermina Barters 04/09/2022, 3:37 PM

## 2022-04-09 NOTE — Progress Notes (Addendum)
PROGRESS NOTE   Subjective/Complaints:  No new concerns this AM. Glucose has been above goal a couple times.  BM today. She says she had HD yesterday.   ROS: Patient denies CP, SOB, N/V/D, abdominal pain, cough Objective:   No results found. No results for input(s): "WBC", "HGB", "HCT", "PLT" in the last 72 hours.  No results for input(s): "NA", "K", "CL", "CO2", "GLUCOSE", "BUN", "CREATININE", "CALCIUM" in the last 72 hours.   Intake/Output Summary (Last 24 hours) at 04/09/2022 1657 Last data filed at 04/09/2022 1300 Gross per 24 hour  Intake 589 ml  Output 2000 ml  Net -1411 ml          Physical Exam: Vital Signs Blood pressure 111/63, pulse 68, temperature 98.5 F (36.9 C), resp. rate 15, height 5\' 9"  (1.753 m), weight 104.6 kg, SpO2 98 %.   General: No acute distress, sitting in chair Mood and affect are appropriate, pleasant Heart: Regular rate and rhythm no rubs murmurs or extra sounds Lungs: Clear to auscultation, breathing unlabored, no rales or wheezes, good air movement Abdomen: Positive bowel sounds, soft nontender to palpation, nondistended Extremities: No clubbing, cyanosis, or edema Left arm AVF  Skin: small left 1x1.5cm buttocks stage 2 as above no drainage or odor Neurologic: Cranial nerves II through XII intact, motor strength is 5/5 in right deltoid, bicep, tricep, grip, hip flexor, knee extensors, 1-2/5 left delt, biceps, 2-triceps and finger flexors, 1/5 hip flexors and 3 knee flexors and extensors--no changes today Sensory exam normal sensation to light touch and proprioception in right upper and lower extremities, absent LT sensation LUE, intact LLE Cerebellar exam normal finger to nose to finger as well as heel to shin in bilateral upper and lower extremities Musculoskeletal: no pain with left hip ROM, no tenderness to palpation    Assessment/Plan: 1. Functional deficits which require 3+  hours per day of interdisciplinary therapy in a comprehensive inpatient rehab setting. Physiatrist is providing close team supervision and 24 hour management of active medical problems listed below. Physiatrist and rehab team continue to assess barriers to discharge/monitor patient progress toward functional and medical goals  Care Tool:  Bathing    Body parts bathed by patient: Left arm, Chest, Abdomen, Right upper leg, Face   Body parts bathed by helper: Front perineal area, Buttocks, Right arm Body parts n/a: Left lower leg, Right lower leg   Bathing assist Assist Level: Maximal Assistance - Patient 24 - 49%     Upper Body Dressing/Undressing Upper body dressing   What is the patient wearing?: Pull over shirt    Upper body assist Assist Level: Moderate Assistance - Patient 50 - 74%    Lower Body Dressing/Undressing Lower body dressing      What is the patient wearing?: Pants, Underwear/pull up     Lower body assist Assist for lower body dressing: Moderate Assistance - Patient 50 - 74%     Toileting Toileting    Toileting assist Assist for toileting: Maximal Assistance - Patient 25 - 49%     Transfers Chair/bed transfer  Transfers assist  Chair/bed transfer activity did not occur: Safety/medical concerns  Chair/bed transfer assist level: 2 Helpers  Locomotion Ambulation   Ambulation assist   Ambulation activity did not occur: Safety/medical concerns          Walk 10 feet activity   Assist  Walk 10 feet activity did not occur: Safety/medical concerns        Walk 50 feet activity   Assist Walk 50 feet with 2 turns activity did not occur: Safety/medical concerns         Walk 150 feet activity   Assist Walk 150 feet activity did not occur: Safety/medical concerns         Walk 10 feet on uneven surface  activity   Assist Walk 10 feet on uneven surfaces activity did not occur: Safety/medical concerns          Wheelchair     Assist Is the patient using a wheelchair?: Yes Type of Wheelchair: Manual    Wheelchair assist level: Dependent - Patient 0% Max wheelchair distance: >150 feet    Wheelchair 50 feet with 2 turns activity    Assist        Assist Level: Dependent - Patient 0%   Wheelchair 150 feet activity     Assist      Assist Level: Dependent - Patient 0%   Blood pressure 111/63, pulse 68, temperature 98.5 F (36.9 C), resp. rate 15, height 5\' 9"  (1.753 m), weight 104.6 kg, SpO2 98 %.  Medical Problem List and Plan: 1. Functional deficits secondary to RIght MCA cardioembolic infarct complicated by prior hx B BKA             -patient may  shower             -ELOS/Goals: MinA mobility and ADL, use R BKA prothetic to transfer to [facility] ,  Not sure whether electric WC will be a safe choice due to Left neglect  Discussed SNF option, pt wishes to pursue this, discussed that search should start due to limited SNFs that accept HD pts and that if bed becomes available prior to 9/4 , that she may be transferred then .  Social work is working on [employer] options  -Continue CIR therapies including PT, OT, and SLP  2.  Antithrombotics: -DVT/anticoagulation:  Pharmaceutical: Eliquis             -antiplatelet therapy: N/A 3. Pain Management: Tylenol prn.  4. Mood/Behavior/Sleep: LCSW to follow for evaluation and support             -continue Elavil at bedtime for insomnia.              -antipsychotic agents: N/A 5. Neuropsych/cognition: This patient is capable of making decisions on her own behalf. 6. Skin/Wound Care: Gr 2 left buttocks no pain or drainage, add desitin BID , roll q 2 h 7. Fluids/Electrolytes/Nutrition: Strict I/O. Add ed1200 cc FR/day.             --daily weights.   8. New onset A fib w/RVR: Monitor HR TID continue Eliquis. HR well controlled 9. T2DM: Hgb A1C- 6.0 and well controlled. Was on insulin Glargine 18 units with 5 units TID meal coverage.  CBG (last 3)   Recent Labs    04/09/22 0615 04/09/22 1148 04/09/22 1622  GLUCAP 141* 139* 240*    Borderline control sees Endo as OP, discussed that she is currently managed with diet only and doing reasonably well, if her diet doesn't change at home she may be managed with oral agent ,Tradjenta may be tried in hospital , pt  would like to think about it  -CBG's continue to be reasonably well controlled 8/25, continue to monitor -8/27 Pt agree to try tradjenta, will order 10. ESRD: HD TTS at the end of the day to help with tolerance of therapy during the day.  Appreciate Nephro assistance - discussed with Nephro, no vascular access issues   -Hemodialysis TTS 11. Small thyroid nodule: Follow up with PCP after discharge for further work up.  12.  Patchy/nodular chest densities: CT chest recommended for follow-up 13. OSA: Continue BIPAP when asleep.   45.  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  Fall PTA with Left hip and shoulder pain check xrays although exam appears mainly consistent with post CVA shoulder pain as well as Left hip troch bursitis  8/14-mild to moderate OA visible on xrays of left shoulder and pelvis    -add voltaren gel to shoulder and hip pain 16.Dysphagia- adv per SLP. Seems to be tolerating diet well  -ON dys3 diet with thin liquids, eating most her meals   LOS:  15 days A FACE TO FACE EVALUATION WAS PERFORMED  Jennye Boroughs 04/09/2022, 4:57 PM

## 2022-04-09 NOTE — Progress Notes (Signed)
Physical Therapy Session Note  Patient Details  Name: Cindy Osborne MRN: [id number] Date of Birth: 23-Sep-1965  Today's Date: 04/09/2022 PT Individual Time: 1051-1115 PT Individual Time Calculation (min): 24 min   General: PT Amount of Missed Time (min): 21 Minutes PT Missed Treatment Reason: Patient fatigue  Short Term Goals: Week 2:  PT Short Term Goal 1 (Week 2): Patient will perform basic transfers with min A using LRAD. PT Short Term Goal 2 (Week 2): Patient will perform sit to stand with mod A using LRAD. PT Short Term Goal 3 (Week 2): Patient will initiate gait training. PT Short Term Goal 4 (Week 2): Patient will propel w/c >50 ft with min A.   Skilled Therapeutic Interventions/Progress Updates:  Patient supine in bed on entrance to room. Patient initially asleep and agreeable to PT session stating that she was resting for therapy. Attempt to rouse pt with pt continuously falling asleep.   Roused pt with ability to motivate pt to bring BLE off EOB with MinA and push UB from bed surface with Mod/ MaxA. VC and MaxA for LUE attn and mgmt. Pt very drousy and requires continuous cueing and encouragement to remain awake, however pt is extremely lethargic and deemed unsafe to continue. Returned to supine MaxA. Positioned in bed using bed features. Propped with pillow under LUE for positioning and comfort.   Patient with no pain complaint throughout session.  Patient supine  in bed asleep at end of session with brakes locked, bed alarm set, and all needs within reach.  Pt missed 21 min of skilled therapy due to fatigue/ lethargy. Will re-attempt as schedule and pt availability permits.    Therapy Documentation Precautions:  Precautions Precautions: Fall, Other (comment) Precaution Comments: L hemiplegia, Bil BKA 2 years ago and 1.5 years ago Required Braces or Orthoses: Other Brace Other Brace: bil prosthetic Restrictions Weight Bearing Restrictions: No  Vital Signs:    Pain: Pain Assessment Pain Scale: 0-10 Pain Score: 0-No pain  Therapy/Group: Individual Therapy  Alger Simons 04/09/2022, 11:11 AM

## 2022-04-10 LAB — GLUCOSE, CAPILLARY
Glucose-Capillary: 131 mg/dL — ABNORMAL HIGH (ref 70–99)
Glucose-Capillary: 149 mg/dL — ABNORMAL HIGH (ref 70–99)
Glucose-Capillary: 189 mg/dL — ABNORMAL HIGH (ref 70–99)
Glucose-Capillary: 162 mg/dL — ABNORMAL HIGH (ref 70–99)

## 2022-04-10 MED ORDER — ZINC OXIDE 40 % EX OINT: TOPICAL_OINTMENT | Freq: Two times a day (BID) | CUTANEOUS | 0 refills | Status: DC

## 2022-04-10 MED ORDER — POLYETHYLENE GLYCOL 3350 17 G PO PACK: 17.0000 g | PACK | Freq: Every day | ORAL | 0 refills | Status: DC

## 2022-04-10 MED ORDER — TRAMADOL HCL 50 MG PO TABS: 25.0000 mg | ORAL_TABLET | Freq: Four times a day (QID) | ORAL | 0 refills | Status: DC | PRN

## 2022-04-10 MED ORDER — POLYETHYLENE GLYCOL 3350 17 G PO PACK: 17.0000 g | PACK | Freq: Every day | ORAL | 0 refills | Status: DC | PRN

## 2022-04-10 MED ORDER — CINACALCET HCL 30 MG PO TABS: 90.0000 mg | ORAL_TABLET | ORAL | Status: DC

## 2022-04-10 MED ORDER — PROCHLORPERAZINE MALEATE 5 MG PO TABS: 5.0000 mg | ORAL_TABLET | Freq: Four times a day (QID) | ORAL | 0 refills | Status: DC | PRN

## 2022-04-10 MED ORDER — LINAGLIPTIN 5 MG PO TABS: 5.0000 mg | ORAL_TABLET | Freq: Every day | ORAL | Status: DC

## 2022-04-10 NOTE — Plan of Care (Signed)
  Problem: RH Balance Goal: LTG: Patient will maintain dynamic sitting balance (OT) Description: LTG:  Patient will maintain dynamic sitting balance with assistance during activities of daily living (OT) Outcome: Adequate for Discharge Note: Pt currently is at a supervision level.    Problem: RH Grooming Goal: LTG Patient will perform grooming w/assist,cues/equip (OT) Description: LTG: Patient will perform grooming with assist, with/without cues using equipment (OT) Outcome: Adequate for Discharge Note: Pt requires set up to min A.    Problem: RH Bathing Goal: LTG Patient will bathe all body parts with assist levels (OT) Description: LTG: Patient will bathe all body parts with assist levels (OT) Outcome: Adequate for Discharge Note: Pt requires min A for UB bathing, and max LB bathing.   Problem: RH Dressing Goal: LTG Patient will perform upper body dressing (OT) Description: LTG Patient will perform upper body dressing with assist, with/without cues (OT). Outcome: Adequate for Discharge Note: Pt requires min -mod A UB dressing.    Problem: RH Dressing Goal: LTG Patient will perform lower body dressing w/assist (OT) Description: LTG: Patient will perform lower body dressing with assist, with/without cues in positioning using equipment (OT) Outcome: Adequate for Discharge Note: Pt requires max LB dressing from bed level.   Problem: RH Functional Use of Upper Extremity Goal: LTG Patient will use RT/LT upper extremity as a (OT) Description: LTG: Patient will use right/left upper extremity as a stabilizer/gross assist/diminished/nondominant/dominant level with assist, with/without cues during functional activity (OT) Outcome: Adequate for Discharge Note: Pt has made progress with her LUE with increased AROM but is not able to yet use it as a stabilizing A, due to decreased attention to L side.    Problem: RH Toileting Goal: LTG Patient will perform toileting task (3/3 steps) with  assistance level (OT) Description: LTG: Patient will perform toileting task (3/3 steps) with assistance level (OT)  Outcome: Not Met (add Reason) Flowsheets (Taken 04/10/2022 1222) LTG: Pt will perform toileting task (3/3 steps) with assistance level: 2 helpers Note: Pt needs to use a mechanical lift with B prothesis on  to access a commode.    Problem: RH Toilet Transfers Goal: LTG Patient will perform toilet transfers w/assist (OT) Description: LTG: Patient will perform toilet transfers with assist, with/without cues using equipment (OT) Outcome: Not Met (add Reason) Flowsheets (Taken 04/10/2022 1222) LTG: Pt will perform toilet transfers with assistance level of: Dependent - Patient equals 0% Note: Pt needs to use a mechanical lift with B prothesis on  to access a commode.    Problem: RH Tub/Shower Transfers Goal: LTG Patient will perform tub/shower transfers w/assist (OT) Description: LTG: Patient will perform tub/shower transfers with assist, with/without cues using equipment (OT) Outcome: Not Met (add Reason) Note: Pt needs to use a mechanical lift with B prothesis on  to access a shower chair. B prosthesis then removed and reapplied to transfer out of shower.    Problem: RH Attention Goal: LTG Patient will demonstrate this level of attention during functional activites (OT) Description: LTG:  Patient will demonstrate this level of attention during functional activites  (OT) Outcome: Not Met (add Reason) Note: Pt continues to require min A to sustain her attention.    Problem: RH Eating Goal: LTG Patient will perform eating w/assist, cues/equip (OT) Description: LTG: Patient will perform eating with assist, with/without cues using equipment (OT) Outcome: Completed/Met

## 2022-04-10 NOTE — Progress Notes (Signed)
Physical Therapy Session Note  Patient Details  Name: Cindy Osborne MRN: [id number] Date of Birth: 1965-09-25  Today's Date: 04/10/2022 PT Individual Time: 1470-9295 PT Individual Time Calculation (min): 18 min  and Today's Date: 04/10/2022 PT Missed Time: 26 Minutes Missed Time Reason: Other (Comment) (d/c)  Short Term Goals: Week 1:  PT Short Term Goal 1 (Week 1): Patient will perform bed mobility with min A with our without use of bed rails. PT Short Term Goal 1 - Progress (Week 1): Met PT Short Term Goal 2 (Week 1): Patient will perform basic transfers with mod A >50% of the time. PT Short Term Goal 2 - Progress (Week 1): Met PT Short Term Goal 3 (Week 1): Patient will perform sit to stand with max A using LRAD. PT Short Term Goal 3 - Progress (Week 1): Met PT Short Term Goal 4 (Week 1): Patient will initiate w/c mobility. PT Short Term Goal 4 - Progress (Week 1): Met Week 2:  PT Short Term Goal 1 (Week 2): Patient will perform basic transfers with min A using LRAD. PT Short Term Goal 2 (Week 2): Patient will perform sit to stand with mod A using LRAD. PT Short Term Goal 3 (Week 2): Patient will initiate gait training. PT Short Term Goal 4 (Week 2): Patient will propel w/c >50 ft with min A.    Skilled Therapeutic Interventions/Progress Updates:  Patient supine in bed with RLE OOB on entrance to room. Patient alert and agreeable to assistance.  Patient with no pain complaint at start of session.  Therapeutic Activity: Pt awaiting non-emergency transport for d/c to SNF this day. Pt assisted with vc/ tc and CGA/ MinA for repositioning in bed as pt is not centered in bed. Is able to follow instructions for bringing BLE with prosthetics donned into center of bed. Pt then able to bridge and unweight bottom with CGA from therapist in order to reposition to more neutral body positioning in bed. Positioned with pillow under LUE for comfort.   Provided pt with requested items.    Patient supine in bed at end of session with brakes locked, bed alarm set, and all needs within reach.   Therapy Documentation Precautions:  Precautions Precautions: Fall Precaution Comments: L hemiplegia, Bil BKA 2 years ago and 1.5 years ago Required Braces or Orthoses: Other Brace Other Brace: bil prosthetic Restrictions Weight Bearing Restrictions: No General: PT Amount of Missed Time (min): 26 Minutes PT Missed Treatment Reason: Other (Comment) (d/c) Vital Signs: Therapy Vitals Temp: 97.9 F (36.6 C) Pulse Rate: 74 Resp: 18 BP: 119/66 Patient Position (if appropriate): Lying Oxygen Therapy SpO2: 97 % O2 Device: Room Air Pain: Pain Assessment Pain Scale: 0-10 Pain Score: 0-No pain Pain Type: Acute pain Pain Location: Hip Pain Orientation: Right Pain Descriptors / Indicators: Aching;Throbbing Pain Onset: On-going Patients Stated Pain Goal: 0 Pain Intervention(s): Medication (See eMAR);RN made aware Mobility: Bed Mobility Bed Mobility: Rolling Right;Rolling Left;Supine to Sit;Sit to Supine Rolling Right: Contact Guard/Touching assist Rolling Left: Minimal Assistance - Patient > 75%;Contact Guard/Touching assist Supine to Sit: Minimal Assistance - Patient > 75% Sit to Supine: Moderate Assistance - Patient 50-74% Transfers Transfers: Sit to Stand;Transfer;Transfer via Lift Equipment Sit to Stand: Maximal Assistance - Patient 25-49% Stand to Sit: Moderate Assistance - Patient 50-74% Transfer (Assistive device): 2 person hand held assist (Mod A +2 from w/c to counter) Transfer via Lift Equipment: Stedy (MinA to CGA with use of STEDY) Locomotion : Gait Ambulation: Yes Gait Assistance: Maximal Assistance -  Patient 25-49%;Total Assistance - Patient < 25%;2 Helpers Gait Distance (Feet): 12 Feet Assistive device: Eva walker;Body weight support system (MaxiSky harness) Gait Assistance Details: Manual facilitation for placement;Manual facilitation for weight  shifting;Verbal cues for gait pattern;Verbal cues for technique;Tactile cues for placement;Tactile cues for posture;Tactile cues for sequencing;Tactile cues for weight shifting;Verbal cues for precautions/safety;Verbal cues for sequencing Wheelchair Mobility Wheelchair Mobility: Yes Wheelchair Assistance: Contact Guard/Touching assist Wheelchair Propulsion: Right upper extremity;Right lower extremity (Attempt with power w/c and pt unsafe) Distance: 100 ft  Trunk/Postural Assessment : Cervical Assessment Cervical Assessment: Exceptions to [facility] (forward head) Thoracic Assessment Thoracic Assessment: Exceptions to [facility] (rounded shoulders with L>R) Lumbar Assessment Lumbar Assessment: Exceptions to [facility] (lumbar lordosis) Postural Control Postural Control: Deficits on evaluation  Balance: Balance Balance Assessed: Yes Static Sitting Balance Static Sitting - Balance Support: Left upper extremity supported Static Sitting - Level of Assistance: 5: Stand by assistance Dynamic Sitting Balance Dynamic Sitting - Balance Support: Right upper extremity supported;Left upper extremity supported Dynamic Sitting - Level of Assistance: 5: Stand by assistance Static Standing Balance Static Standing - Balance Support: Left upper extremity supported Static Standing - Level of Assistance: 4: Min assist;3: Mod assist   Therapy/Group: Individual Therapy  Alger Simons PT, DPT, CSRS 04/10/2022, 4:44 PM

## 2022-04-10 NOTE — Progress Notes (Signed)
Physical Therapy Discharge Summary  Patient Details  Name: Cindy Osborne MRN: [id number] Date of Birth: 1966/04/25  Date of Discharge from PT service:April 10, 2022  Today's Date: 04/10/2022 PT Individual Time: 0802-0905 PT Individual Time Calculation (min): 63 min    Patient has met 2 of 7 long term goals due to improved activity tolerance, improved balance, improved postural control, increased strength, functional use of  left lower extremity, improved attention, improved awareness, and improved coordination.  Patient to discharge at a wheelchair level  CGA .   Patient's care partner  independent workers at SNF  to provide the necessary physical and cognitive assistance at discharge.  Reasons goals not met: 4 goals deemed adequate for d/c as pt now not discharging home but to SNF with immediate placement opportunity and leaving IP rehab on e week earlier than expected. 1 other goal deemed n/a d/t pt not leaving via car requiring car transfer. Pt is expected to continue to progress at next level of care to reach these goals.   Recommendation:  Patient will benefit from ongoing skilled PT services in skilled nursing facility setting to continue to advance safe functional mobility, address ongoing impairments in strength, coordination, balance, activity tolerance, cognition, safety awareness, and to minimize fall risk.  Equipment: No equipment provided  Reasons for discharge: discharge from hospital and availability for immediate SNF placement near pt's home and with transport available for dialysis.   Patient/family agrees with progress made and goals achieved: Yes  PT Discharge Precautions/Restrictions Precautions Precautions: Fall Precaution Comments: L hemiplegia, Bil BKA 2 years ago and 1.5 years ago Required Braces or Orthoses: Other Brace Other Brace: bil prosthetic Restrictions Weight Bearing Restrictions: No Vital Signs Therapy Vitals Temp: 97.9 F (36.6 C) Pulse  Rate: 74 Resp: 18 BP: 119/66 Patient Position (if appropriate): Lying Oxygen Therapy SpO2: 97 % O2 Device: Room Air Pain Pain Assessment Pain Scale: 0-10 Pain Score: 0-No pain Pain Type: Acute pain Pain Location: Hip Pain Orientation: Right Pain Descriptors / Indicators: Aching;Throbbing Pain Onset: On-going Patients Stated Pain Goal: 0 Pain Intervention(s): Medication (See eMAR);RN made aware Pain Interference Pain Interference Pain Effect on Sleep: 1. Rarely or not at all Pain Interference with Therapy Activities: 1. Rarely or not at all Pain Interference with Day-to-Day Activities: 2. Occasionally Vision/Perception  Vision - History Ability to See in Adequate Light: 0 Adequate Vision - Assessment Eye Alignment: Within Functional Limits Ocular Range of Motion: Within Functional Limits Alignment/Gaze Preference: Gaze right Tracking/Visual Pursuits: Decreased smoothness of eye movement to LEFT superior field;Decreased smoothness of eye movement to LEFT inferior field;Unable to hold eye position out of midline;Requires cues, head turns, or add eye shifts to track Saccades: Additional head turns occurred during testing Perception Perception: Impaired Inattention/Neglect: Does not attend to left visual field;Does not attend to left side of body Praxis Praxis: Impaired Praxis Impairment Details: Motor planning  Cognition Overall Cognitive Status: Impaired/Different from baseline Arousal/Alertness: Awake/alert Orientation Level: Oriented to person;Oriented to place;Oriented to situation;Disoriented to time Year: 2023 Month: August Day of Week: Incorrect Memory: Impaired Memory Impairment: Decreased recall of new information;Decreased short term memory Awareness: Impaired Executive Function: Sequencing;Self Correcting;Self Monitoring Safety/Judgment: Impaired Sensation Sensation Light Touch: Appears Intact Hot/Cold: Appears Intact Proprioception: Impaired by gross  assessment;Impaired Detail Proprioception Impaired Details: Impaired LUE;Impaired LLE Stereognosis: Impaired by gross assessment Coordination Gross Motor Movements are Fluid and Coordinated: No Fine Motor Movements are Fluid and Coordinated: No Coordination and Movement Description: L hemi UE>LE Finger Nose Finger Test: unable to perform  on LUE, but can bring hand to chin Heel Shin Test: unable d/t Bil BKA Motor  Motor Motor: Hemiplegia;Abnormal postural alignment and control Motor - Skilled Clinical Observations: L hemi, B amputee Motor - Discharge Observations: L hemi, B amputee  Mobility Bed Mobility Bed Mobility: Rolling Right;Rolling Left;Supine to Sit;Sit to Supine Rolling Right: Contact Guard/Touching assist Rolling Left: Minimal Assistance - Patient > 75%;Contact Guard/Touching assist Supine to Sit: Minimal Assistance - Patient > 75% Sit to Supine: Moderate Assistance - Patient 50-74% Transfers Transfers: Sit to Stand;Transfer;Transfer via Lift Equipment Sit to Stand: Maximal Assistance - Patient 25-49% Stand to Sit: Moderate Assistance - Patient 50-74% Transfer (Assistive device): 2 person hand held assist (Mod A +2 from w/c to counter) Transfer via Lift Equipment: Stedy (MinA to CGA with use of STEDY) Locomotion  Gait Ambulation: Yes Gait Assistance: Maximal Assistance - Patient 25-49%;Total Assistance - Patient < 25%;2 Helpers Gait Distance (Feet): 12 Feet Assistive device: Eva walker;Body weight support system (MaxiSky harness) Gait Assistance Details: Manual facilitation for placement;Manual facilitation for weight shifting;Verbal cues for gait pattern;Verbal cues for technique;Tactile cues for placement;Tactile cues for posture;Tactile cues for sequencing;Tactile cues for weight shifting;Verbal cues for precautions/safety;Verbal cues for sequencing Pick up small object from the floor (from standing position) activity did not occur: Safety/medical concerns Wheelchair  Mobility Wheelchair Mobility: Yes Wheelchair Assistance: Development worker, international aid: Right upper extremity;Right lower extremity (Attempt with power w/c and pt unsafe) Distance: 100 ft  Trunk/Postural Assessment  Cervical Assessment Cervical Assessment: Exceptions to [facility] (forward head) Thoracic Assessment Thoracic Assessment: Exceptions to [facility] (rounded shoulders with L>R) Lumbar Assessment Lumbar Assessment: Exceptions to [facility] (lumbar lordosis) Postural Control Postural Control: Deficits on evaluation  Balance Balance Balance Assessed: Yes Static Sitting Balance Static Sitting - Balance Support: Left upper extremity supported Static Sitting - Level of Assistance: 5: Stand by assistance Dynamic Sitting Balance Dynamic Sitting - Balance Support: Right upper extremity supported;Left upper extremity supported Dynamic Sitting - Level of Assistance: 5: Stand by assistance Static Standing Balance Static Standing - Balance Support: Left upper extremity supported Static Standing - Level of Assistance: 4: Min assist;3: Mod assist Extremity Assessment  RUE Assessment RUE Assessment: Within Functional Limits LUE Assessment Active Range of Motion (AROM) Comments: sh flex 30, elbow flexion 90, wrist extension 30, 75% finger extension, 3/5 grasp General Strength Comments: synergy developing LUE Body System: Neuro Brunstrum levels for arm and hand: Arm;Hand Brunstrum level for arm: Stage III Synergy is performed voluntarily Brunstrum level for hand: Stage III Synergies performed voluntarily RLE Assessment RLE Assessment: Exceptions to [facility] Active Range of Motion (AROM) Comments: Knee WFL General Strength Comments: Grossly 4+/5 hip/knee flexion/extension in sitting LLE Assessment LLE Assessment: Exceptions to [facility] Passive Range of Motion (PROM) Comments: knee grossly [facility] General Strength Comments: Grossly 3- to 3/5 hip/knee flexion/extension decreasing to 2and 2+ with  fatigue  Skilled intervention: Patient supine in bed and askew on entrance to room. Patient alert and agreeable to PT session. Requests to dress if leaving room. Discussion with pt re: scheduled family education and pt relating she talked to family previous evening and stated they wee coming for family ed. NT bringing breakfast in at start of session and suggested to pt that d/t family education, need to dress and ready self first is recommended, then can eat with full supervision. Pt in agreement.   Patient with no pain complaint at start of session. With transition to sitting, pt relates increased pain in L hip to RN.   Therapeutic  Activity: Bed Mobility: Pt rolls to R with supervision and to L with CGA/ MinA to fully roll to sidelying. Able to position shorts up higher on pt prior to move to sit EOB. Rolls to R  side and pulls on bed rail with RUE for assist to bring BLE off EOB. MinA to complete turn of L hip to EOB. Bil silicone sleeves and prosthetic legs donned with Max A for time. Sitting balance is overall supervision with no BLE on floor and prior to donning prosthetics.   Transfers: Pt positioned with STEDY and is able to rise to stand with CGA and elevated bed position.  Pt performs several sit<>stand transfers to/ from perch seat with supervision as well as to/ from bed with CGA/ MinA. Following donning of shorts, recommended to pt to change brief. Pt relates brief changed last night and did not use brief throughout night. Brief changed so that nursing could remove dressing from open sore on L buttock and assess. Brief removed, posterior pericare performed, added barrier cream to sore prior to dressing change and then donned new brief with MaxA.   Neuromuscular Re-ed: NMR facilitated during session with focus on standing balance, weight shifting forward with push from bed surface/ bedrail. Pt requires RUE pull on bar for sit<>stand performance. Improved weight shift forward with vc/ tc to R  back. Pt's L knee with no sign of buckling but does fatigue into knee brace of STEDY with fatigue. NMR performed for improvements in motor control and coordination, balance, sequencing, judgement, and self confidence/ efficacy in performing all aspects of mobility at highest level of independence.   Patient seated upright at end of session with brakes locked, no alarm set, and all needs within reach. Providing supervision to pt while eating breakfast and seated upright in w/c. Handoff to SLP at end of session.    Alger Simons PT, DPT, CSRS 04/10/2022, 2:57 PM

## 2022-04-10 NOTE — Progress Notes (Signed)
PROGRESS NOTE   Subjective/Complaints:  Per PT now MinA for sliding board, does well with steadi Eating breakfast currently no swallowing issues.Per PT  Left buttocks area getting smaller with desitin treatment (reapplied per PT) , no longer stinging   ROS: Patient denies CP, SOB, N/V/D, abdominal pain, cough Objective:   No results found. No results for input(s): "WBC", "HGB", "HCT", "PLT" in the last 72 hours.  No results for input(s): "NA", "K", "CL", "CO2", "GLUCOSE", "BUN", "CREATININE", "CALCIUM" in the last 72 hours.   Intake/Output Summary (Last 24 hours) at 04/10/2022 0851 Last data filed at 04/09/2022 1300 Gross per 24 hour  Intake 236 ml  Output --  Net 236 ml          Physical Exam: Vital Signs Blood pressure 129/76, pulse 78, temperature 98.1 F (36.7 C), resp. rate 17, height 5\' 9"  (1.753 m), weight 102.4 kg, SpO2 97 %.   General: No acute distress, sitting in chair Mood and affect are appropriate, pleasant Heart: Regular rate and rhythm no rubs murmurs or extra sounds Lungs: Clear to auscultation, breathing unlabored, no rales or wheezes, good air movement Abdomen: Positive bowel sounds, soft nontender to palpation, nondistended Extremities: No clubbing, cyanosis, or edema Left arm AVF  Skin: buttocks area not seen today Neurologic: Cranial nerves II through XII intact, motor strength is 5/5 in right deltoid, bicep, tricep, grip, hip flexor, knee extensors, 2-/5 left delt, biceps, 2-triceps and finger flexors,lower extremities Musculoskeletal: no pain with left hip ROM, no tenderness to palpation    Assessment/Plan: 1. Functional deficits which require 3+ hours per day of interdisciplinary therapy in a comprehensive inpatient rehab setting. Physiatrist is providing close team supervision and 24 hour management of active medical problems listed below. Physiatrist and rehab team continue to assess  barriers to discharge/monitor patient progress toward functional and medical goals  Care Tool:  Bathing    Body parts bathed by patient: Left arm, Chest, Abdomen, Right upper leg, Face   Body parts bathed by helper: Front perineal area, Buttocks, Right arm Body parts n/a: Left lower leg, Right lower leg   Bathing assist Assist Level: Maximal Assistance - Patient 24 - 49%     Upper Body Dressing/Undressing Upper body dressing   What is the patient wearing?: Pull over shirt    Upper body assist Assist Level: Moderate Assistance - Patient 50 - 74%    Lower Body Dressing/Undressing Lower body dressing      What is the patient wearing?: Pants, Underwear/pull up     Lower body assist Assist for lower body dressing: Moderate Assistance - Patient 50 - 74%     Toileting Toileting    Toileting assist Assist for toileting: Maximal Assistance - Patient 25 - 49%     Transfers Chair/bed transfer  Transfers assist  Chair/bed transfer activity did not occur: Safety/medical concerns  Chair/bed transfer assist level: 2 Helpers     Locomotion Ambulation   Ambulation assist   Ambulation activity did not occur: Safety/medical concerns          Walk 10 feet activity   Assist  Walk 10 feet activity did not occur: Safety/medical concerns  Walk 50 feet activity   Assist Walk 50 feet with 2 turns activity did not occur: Safety/medical concerns         Walk 150 feet activity   Assist Walk 150 feet activity did not occur: Safety/medical concerns         Walk 10 feet on uneven surface  activity   Assist Walk 10 feet on uneven surfaces activity did not occur: Safety/medical concerns         Wheelchair     Assist Is the patient using a wheelchair?: Yes Type of Wheelchair: Manual    Wheelchair assist level: Dependent - Patient 0% Max wheelchair distance: >150 feet    Wheelchair 50 feet with 2 turns activity    Assist         Assist Level: Dependent - Patient 0%   Wheelchair 150 feet activity     Assist      Assist Level: Dependent - Patient 0%   Blood pressure 129/76, pulse 78, temperature 98.1 F (36.7 C), resp. rate 17, height 5\' 9"  (1.753 m), weight 102.4 kg, SpO2 97 %.  Medical Problem List and Plan: 1. Functional deficits secondary to RIght MCA cardioembolic infarct complicated by prior hx B BKA             -patient may  shower             -ELOS/Goals: MinA mobility and ADL, use R BKA prothetic to transfer to [facility] ,  Not sure whether electric WC will be a safe choice due to Left neglect  Discussed SNF option, pt wishes to pursue this, discussed that search should start due to limited SNFs that accept HD pts and that if bed becomes available prior to 9/4 , that she may be transferred then .  Social work is working on [employer] options  -Continue CIR therapies including PT, OT, and SLP  2.  Antithrombotics: -DVT/anticoagulation:  Pharmaceutical: Eliquis             -antiplatelet therapy: N/A 3. Pain Management: Tylenol prn.  4. Mood/Behavior/Sleep: LCSW to follow for evaluation and support             -continue Elavil at bedtime for insomnia.              -antipsychotic agents: N/A 5. Neuropsych/cognition: This patient is capable of making decisions on her own behalf. 6. Skin/Wound Care: Gr 2 left buttocks no pain or drainage, add desitin BID , roll q 2 h- no further pain in that area, per PT area is healing with current tx 7. Fluids/Electrolytes/Nutrition: Strict I/O. Add ed1200 cc FR/day.             --daily weights.   8. New onset A fib w/RVR: Monitor HR TID continue Eliquis. HR well controlled 9. T2DM: Hgb A1C- 6.0 and well controlled. Was on insulin Glargine 18 units with 5 units TID meal coverage.  CBG (last 3)  Recent Labs    04/09/22 1622 04/09/22 2104 04/10/22 0612  GLUCAP 240* 177* 131*    Borderline control sees Endo as OP, discussed that she is currently managed with diet only  and doing reasonably well, if her diet doesn't change at home she may be managed with oral agent ,Tradjenta may be tried in hospital , pt would like to think about it  -CBG's continue to be reasonably well controlled 8/25, continue to monitor -8/27 Pt agree to try tradjenta, will order 10. ESRD: HD TTS at the end of  the day to help with tolerance of therapy during the day.  Appreciate Nephro assistance - discussed with Nephro, no vascular access issues   -Hemodialysis TTS 11. Small thyroid nodule: Follow up with PCP after discharge for further work up.  12.  Patchy/nodular chest densities: CT chest recommended for follow-up 13. OSA: Continue BIPAP when asleep.   52.  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  Fall PTA with Left hip and shoulder pain check xrays although exam appears mainly consistent with post CVA shoulder pain as well as Left hip troch bursitis  8/14-mild to moderate OA visible on xrays of left shoulder and pelvis    -add voltaren gel to shoulder and hip pain 16.Dysphagia- adv per SLP. Seems to be tolerating diet well  -ON dys3 diet with thin liquids, eating most her meals   LOS:  16 days A FACE TO FACE EVALUATION WAS PERFORMED  Cindy Osborne 04/10/2022, 8:51 AM

## 2022-04-10 NOTE — Progress Notes (Signed)
Speech Language Pathology Weekly Progress and Session Note  Patient Details  Name: Cindy Osborne MRN: [id number] Date of Birth: [birth year]  Beginning of progress report period: April 03, 2022 End of progress report period: April 10, 2022  Today's Date: 04/10/2022 SLP Individual Time: 5436-0677 SLP Individual Time Calculation (min): 45 min  Short Term Goals: Week 2: SLP Short Term Goal 1 (Week 2): Pt will consume current diet with minimal overt s/sx of aspiration with sup A verbal cues to reduce rate of consumption, bite sizes, L pocketing/anterior spillage SLP Short Term Goal 1 - Progress (Week 2): Met SLP Short Term Goal 2 (Week 2): Patient will implement speech intelligibility strategies at the sentence level with with sup A verbal cues to monitor and self correct SLP Short Term Goal 2 - Progress (Week 2): Met SLP Short Term Goal 3 (Week 2): Patient will utilize internal/external memory strategies to recall novel information with min A verbal cues SLP Short Term Goal 3 - Progress (Week 2): Met SLP Short Term Goal 4 (Week 2): Pt will demonstrate awareness of errors with min-to-mod A verbal/visual cues SLP Short Term Goal 4 - Progress (Week 2): Not met SLP Short Term Goal 5 (Week 2): Pt will complete mildly complex problem solving with min-to-mod A verbal/visual cues SLP Short Term Goal 5 - Progress (Week 2): Met    New Short Term Goals: Week 3: SLP Short Term Goal 1 (Week 3): STG=LTG due to short ELOS (9/4)  Weekly Progress Updates:Pt made good progress meeting 4 out 5 goals. Pt demonstrates improvements in speech intelligibility, swallow strategies on dys 3 textures and thin liquids, and functional recall. Pt's barriers at discharge are reduced intellectual/error awareness impacting mildly complex problem solving skills, left scanning and continued but improved deficits in speech, swallow function and recall. Education is on going. Pt would continue to benefit from skilled ST  services in order to maximize functional independence and reduce burden of care, requiring 24 hour supervision at discharge with continued skilled ST services.      Intensity: Minumum of 1-2 x/day, 30 to 90 minutes Frequency: 3 to 5 out of 7 days Duration/Length of Stay: 9/4 Treatment/Interventions: Cognitive remediation/compensation;Speech/Language facilitation;Dysphagia/aspiration precaution training;Functional tasks;Patient/family education;Therapeutic Activities;Internal/external aids   Daily Session  Skilled Therapeutic Interventions:  Skilled ST services focused on cognitive skills. Pt's family was not present for education and pt attempted to call them several times during the session. Pt demonstrated recall of swallow strategies with supervision A verbal cues. SLP facilitated problem solving, error awareness, recall and left scanning in novel PEG design and letter cancellation tasks. Pt demonstrated basic problem solving in recreating design (placement on board and repeating pattern) with min A verbal cues, however pt required max-mod A verbal cues for error awareness. In letter cancellation task pt demonstrated improvement of left scanning, error awareness and recall from 10/12 items to 15/15 items with supervision A verbal cues. Pt was left in room with call bell within reach and chair alarm set. SLP recommends to continue skilled services.   General    Pain Pain Assessment Pain Scale: 0-10 Pain Score: 0-No pain Pain Type: Acute pain Pain Location: Hip Pain Orientation: Right Pain Descriptors / Indicators: Aching;Throbbing Pain Onset: On-going Patients Stated Pain Goal: 0 Pain Intervention(s): Medication (See eMAR);RN made aware  Therapy/Group: Individual Therapy  Idrissa Beville  [facility] 04/10/2022, 2:27 PM

## 2022-04-10 NOTE — Progress Notes (Signed)
Patient ID: Cindy Osborne, female   DOB: Nov 25, 1965, 56 y.o.   MRN: [id number]  This SW covering for primary SW, Erlene Quan.  SW received phone call from [name] inquiring about SNF and transition. SW informed still waiting on updates from SNF on bed offer.   SW spoke with Tammy/Admissions to discuss bed offer. Reports able to accept pt today and will transport to dialysis. Would like to know location of [facility]. SW will follow-up.   Attending agreeable to pt discharge today.   SW scheduled ambulance transportation with [facility] for p/u at 12pm.   SW informed Tammy/Admissions on transportation and dialysis location. Pt going to Rm# [id number]; nurse report [phone] and ask for [facility].   SW spoke with [name] to discuss above, and she will have her  mother and sister meet her at the facility.   SW met with [name] to inform on d/c today to [facility]. Pt aware her family will meet  her at facility.   NCMUST-Pt downgraded to Level I and not appropriate for Level II. NCPASRR#: [id number]  SW left d/c packet at front desk.   Loralee Pacas, MSW, [place] Office: [phone] Cell: [phone] Fax: [phone]

## 2022-04-10 NOTE — Discharge Summary (Signed)
Physician Discharge Summary  Patient ID: Cindy Osborne MRN: [id number] DOB/AGE: 05/12/1966 56 y.o.  Admit date: 03/25/2022 Discharge date: 04/10/2022  Discharge Diagnoses:  Principal Problem:   Acute ischemic right MCA stroke [facility]) Active Problems:   ESRD (end stage renal disease) on dialysis ([place])   Type II diabetes mellitus with complication ([place])   Hyperlipidemia associated with type 2 diabetes mellitus (HCC)   Slow transit constipation   Diabetic retinopathy ([place])   Acquired absence of right leg below knee (HCC)   PVD (peripheral vascular disease) ([place])   Acquired absence of left leg below knee (HCC)   Obesity   Pressure injury of skin   Discharged Condition: stable  Significant Diagnostic Studies: DG HIP UNILAT WITH PELVIS 2-3 VIEWS LEFT  Result Date: 03/26/2022 CLINICAL DATA:  Acute hip pain EXAM: DG HIP (WITH OR WITHOUT PELVIS) 2-3V LEFT COMPARISON:  None FINDINGS: No fracture or dislocation identified to explain the patient's pain. Mild degenerative changes in the left hip without loss of joint space. No other significant abnormalities. IMPRESSION: No cause for left hip pain identified. Electronically Signed   By: Dorise Bullion III M.D.   On: 03/26/2022 10:37   DG Shoulder 1V Left  Result Date: 03/26/2022 CLINICAL DATA:  [id number]; pain EXAM: LEFT SHOULDER COMPARISON:  None Available. FINDINGS: There is no evidence of fracture or dislocation. Mild-to-moderate degenerative/hypertrophic changes of the [facility] joint. No other focal bone abnormality. Soft tissues are unremarkable. IMPRESSION: No fracture or dislocation. Mild-to-moderate degenerative changes of the [place] joint. Electronically Signed   By: Frazier Richards M.D.   On: 03/26/2022 10:18    Labs:  Basic Metabolic Panel:    Latest Ref Rng & Units 04/04/2022   12:50 PM 03/28/2022    2:44 PM 03/27/2022    5:58 AM  BMP  Glucose 70 - 99 mg/dL 208  162  138   BUN 6 - 20 mg/dL 65  52  35   Creatinine 0.44 - 1.00 mg/dL 9.24   9.70  7.93   Sodium 135 - 145 mmol/L 130  133  136   Potassium 3.5 - 5.1 mmol/L 4.1  4.2  4.1   Chloride 98 - 111 mmol/L 92  94  95   CO2 22 - 32 mmol/L 23  24  27    Calcium 8.9 - 10.3 mg/dL 9.6  9.4  9.4      CBC:    Latest Ref Rng & Units 04/04/2022   12:50 PM 03/28/2022    2:44 PM 03/27/2022    5:58 AM  CBC  WBC 4.0 - 10.5 K/uL 7.1  8.1  7.5   Hemoglobin 12.0 - 15.0 g/dL 11.2  11.5  11.5   Hematocrit 36.0 - 46.0 % 34.6  36.1  37.3   Platelets 150 - 400 K/uL 263  253  238      CBG: Recent Labs  Lab 04/09/22 0615 04/09/22 1148 04/09/22 1622 04/09/22 2104 04/10/22 0612  GLUCAP 141* 139* 240* 177* 131*    Brief HPI:   Cindy Osborne is a 56 y.o.  RH-female with history of HTN, T2DM, CVA 01/2022, B-BKA who was admitted on 03/21/22 with fall and onset of left sided weakness with left hemineglect and left facial droop with slurred speech. CTA head/neck showed acute right M2/MCA occlusion with 21 ml penumbra and scattered opacities bilateral lung apcices likely infectious and follow up CT chest recommended due to  nodular appearance. She underwent mechanical thrombectomy with complete revascularization and MRI brain done revealing  moderate size patchy acute ischemic R-MCA infarct affecting right frontal and parietal lobes with associated petechial hemorrhage.   Stroke felt to be cardioembolic with plans for cardiac work up but she developed A fib with RVR requiring Cardizem for rate control. Dr. Stanford Breed recommended Eliquis and she converted to NSR. Home dose Toprol was resumed and HR controlled. She continued on D2 diet and HD on TTS schedule. Therapy was working with patient who was limited by left sided weakness, B-BKA, impulsivity with poor safety awareness. CIR was recommended due to functional decline.    Hospital Course: Cindy Osborne was admitted to rehab 03/25/2022 for inpatient therapies to consist of PT, ST and OT at least three hours five days a week. Past admission  physiatrist, therapy team and rehab RN have worked together to provide customized collaborative inpatient rehab. She is tolerating Eliquis without SE. Follow up CBC showed H/H to be stable. Her blood pressures and heart rate were monitored on TID basis and have been controlled.Hemodialysis has been ongoing on TTS schedule and she has been tolerating this without difficulty. Her diabetes has been monitored with ac/hs CBG checks and SSI was use prn for tighter BS control.  Blood sugars were monitored with diet and as needed insulin prior to admission due to issues with hypoglycemia.  She was agreeable to [place] which was added with improvement in blood sugar control.    Miralax has been effective for management of constipation. Po intake has been good. Her respiratory status has been stable without any cough or shortness of breath and recommend CT chest in couple weeks for follow-up on nodular densities. Xrays of left hip and shoulder were done due to reports of pain after fall PTA. Hip films showed mild degenerative changes and mild to moderate OA visible on Left shoulder films.  Tramadol has also been used on prn basis for pain. Left shoulder pain felt to be due to CVA and left hip trochanteric bursitis. Her diet was advanced to D# on 08/18 and she is tolerating this without s/s of aspiration. She did develop stage 2 area of breakdown on left buttock which has been managed with use of Desitin as well as pressure relief measures.     Rehab course: During patient's stay in rehab weekly team conferences were held to monitor patient's progress, set goals and discuss barriers to discharge. At admission, patient required max assist with mobility and with basic ADL tasks.  She exhibited mild dysphagia with prolonged mastication and pocketing on left with awareness of this. She  has had improvement in activity tolerance, balance, postural control as well as ability to compensate for deficits. She has had improvement  in functional use LUE  and LLE as well as improvement in awareness. She requires max assist with bathing and mod assist with dressing tasks.  She requires min assist with mod to max cues for bed mobility and max assist. She requires mod assist verbal cues for mildly complex tasks.    Wound care:  Turn every 2 hours when in bed and boost every 30 minutes when in chair.  Desitin to buttocks bid and prn.       Disposition:  Skilled Nursing facility.   Diet: Dysphagia 3, Renal diet with 1200 cc /FR.  Thin liquids  Special Instructions: Will need CT chest to follow up on nodular densities.  Monitor BS ac/hs and use SSI per protocol.  Please document daily weights with strict I/O.  4. Use ice and/or heat prn for left  shoulder/hip pain.   Discharge Instructions     Ambulatory referral to Neurology   Complete by: As directed    An appointment is requested in approximately: 4 weeks   Ambulatory referral to Physical Medicine Rehab   Complete by: As directed    1-2 weeks TC appointment      Allergies as of 04/10/2022       Reactions   Augmentin [amoxicillin-pot Clavulanate] Shortness Of Breath   Atorvastatin Other (See Comments)   Back pain   Pioglitazone Other (See Comments)   Edema   Peanut-containing Drug Products Itching   Rosuvastatin Itching, Other (See Comments)   Back pain   Shellfish-derived Products Itching, Rash   shrimp   Sulfa Antibiotics Rash, Other (See Comments)   Shedding of skin        Medication List     STOP taking these medications    fexofenadine 180 MG tablet Commonly known as: ALLEGRA   insulin aspart 100 UNIT/ML injection Commonly known as: novoLOG   ondansetron 4 MG/2ML Soln injection Commonly known as: ZOFRAN   senna-docusate 8.6-50 MG tablet Commonly known as: Senokot-S       TAKE these medications    acetaminophen 325 MG tablet Commonly known as: TYLENOL Take 1-2 tablets (325-650 mg total) by mouth every 4 (four) hours as  needed for mild pain. What changed:  how much to take reasons to take this Another medication with the same name was removed. Continue taking this medication, and follow the directions you see here.   amitriptyline 25 MG tablet Commonly known as: ELAVIL Take 25 mg by mouth at bedtime.   apixaban 5 MG Tabs tablet Commonly known as: ELIQUIS Take 1 tablet (5 mg total) by mouth 2 (two) times daily.   cinacalcet 30 MG tablet Commonly known as: SENSIPAR Take 3 tablets (90 mg total) by mouth Every Tuesday,Thursday,and Saturday with dialysis.   linagliptin 5 MG Tabs tablet Commonly known as: TRADJENTA Take 1 tablet (5 mg total) by mouth daily. Start taking on: April 11, 2022   liver oil-zinc oxide 40 % ointment Commonly known as: DESITIN Apply topically 2 (two) times daily. To buttocks   metoprolol 200 MG 24 hr tablet Commonly known as: TOPROL-XL Take 1 tablet (200 mg total) by mouth daily. Take with or immediately following a meal.   polyethylene glycol 17 g packet Commonly known as: MIRALAX / GLYCOLAX Take 17 g by mouth daily as needed. If no BM in 48 hours   pravastatin 40 MG tablet Commonly known as: PRAVACHOL Take 1 tablet (40 mg total) by mouth daily at 6 PM.   prochlorperazine 5 MG tablet Commonly known as: COMPAZINE Take 1-2 tablets (5-10 mg total) by mouth every 6 (six) hours as needed for nausea.   traMADol 50 MG tablet--Rx# 5 pills Commonly known as: ULTRAM Take 0.5-1 tablets (25-50 mg total) by mouth every 6 (six) hours as needed for severe pain.        Contact information for follow-up providers     Kirsteins, Luanna Salk, MD Follow up.   Specialty: Physical Medicine and Rehabilitation Why: office will call you with follow up appointment Contact information: [place] [address] [phone]         [facility] Follow up.   Why: office will call you with follow up appointment Contact information: [address]     [address] [place] [address] [phone]  Glean Hess, MD Follow up.   Specialty: Internal Medicine Why: Call in 1-2 days for post hospital follow up Contact information: [place] [address] [phone]              Contact information for after-discharge care     Destination     [facility] Preferred SNF .   Service: Skilled Nursing Contact information: [address] [place] [phone]                     Signed: Bary Leriche 04/10/2022, 11:47 AM

## 2022-04-10 NOTE — Plan of Care (Signed)
  Problem: RH Balance Goal: LTG Patient will maintain dynamic sitting balance (PT) Description: LTG:  Patient will maintain dynamic sitting balance with assistance during mobility activities (PT) Outcome: Adequate for Discharge Flowsheets (Taken 04/10/2022 1458) LTG: Pt will maintain dynamic sitting balance during mobility activities with:: Supervision/Verbal cueing Note: Pt discharging one week earlier than expected d/t placement in SNF close to pt's home and with access to transport to dialysis.    Problem: Sit to Stand Goal: LTG:  Patient will perform sit to stand with assistance level (PT) Description: LTG:  Patient will perform sit to stand with assistance level (PT) Outcome: Adequate for Discharge Flowsheets (Taken 04/10/2022 1500) LTG: PT will perform sit to stand in preparation for functional mobility with assistance level: Maximal Assistance - Patient 25 - 49% Note: Pt discharging one week earlier than expected d/t placement in SNF close to pt's home and with access to transport to dialysis.    Problem: RH Wheelchair Mobility Goal: LTG Patient will propel w/c in controlled environment (PT) Description: LTG: Patient will propel wheelchair in controlled environment, # of feet with assist (PT) Outcome: Adequate for Discharge Flowsheets Taken 04/10/2022 1500 by Alger Simons, PT LTG: Pt will propel w/c in controlled environ  assist needed:: Contact Guard/Touching assist Taken 03/26/2022 1253 by Grunenberg, Cherie L, PT LTG: Propel w/c distance in controlled environment: >100 ft Note: Using hemitechnique.  Pt discharging one week earlier than expected d/t placement in SNF close to pt's home and with access to transport to dialysis.  Goal: LTG Patient will propel w/c in home environment (PT) Description: LTG: Patient will propel wheelchair in home environment, # of feet with assistance (PT). Outcome: Adequate for Discharge Flowsheets Taken 04/10/2022 1500 by Alger Simons, PT LTG: Pt  will propel w/c in home environ  assist needed:: Contact Guard/Touching assist Distance: wheelchair distance in controlled environment: 100 Taken 03/26/2022 1253 by Grunenberg, Cherie L, PT LTG: Propel w/c distance in home environment: 50 ft Note: Pt discharging one week earlier than expected d/t placement in SNF close to pt's home and with access to transport to dialysis.    Problem: RH Bed Mobility Goal: LTG Patient will perform bed mobility with assist (PT) Description: LTG: Patient will perform bed mobility with assistance, with/without cues (PT). Outcome: Completed/Met Flowsheets (Taken 03/26/2022 1253 by Grunenberg, Cherie L, PT) LTG: Pt will perform bed mobility with assistance level of: Minimal Assistance - Patient > 75%   Problem: RH Bed to Chair Transfers Goal: LTG Patient will perform bed/chair transfers w/assist (PT) Description: LTG: Patient will perform bed to chair transfers with assistance (PT). Outcome: Completed/Met Flowsheets (Taken 03/26/2022 1253 by Grunenberg, Cherie L, PT) LTG: Pt will perform Bed to Chair Transfers with assistance level: Minimal Assistance - Patient > 75% Note: Using Slide Board.   Problem: RH Car Transfers Goal: LTG Patient will perform car transfers with assist (PT) Description: LTG: Patient will perform car transfers with assistance (PT). Outcome: Not Applicable Flowsheets (Taken 04/10/2022 1500) LTG: Pt will perform car transfers with assist:: Moderate Assistance - Patient 50 - 74% Note: Pt discharging one week earlier than expected d/t placement in SNF close to pt's home and with access to transport to dialysis. Non emergency transport used.

## 2022-04-10 NOTE — Progress Notes (Signed)
  [facility] Progress Note   OP HD: [facility] TTS ([facility] [id number]- [id number])   4h  104.3kg  400/600  2/2.5 bath  Heparin 2000 LUA AVF  hep B 8/08 - mircera 150 ug q2, last 8/5, last Hb 9.7 - calcitriol 0.5 ug tiw - sensipar 90 po tiw    Assessment/Plan: Debility - post CVA, on CIR now (left sided weakness improving c/w at admission).  Acute CVA - sp intervention 8/8. Was here in June for similar event. Deficits improving some.  pAFib - seen by cardiology and getting metoprolol 200mg  daily & eliquis.  Acute resp failure w/ hypoxemia - resolved  ESRD - on HD TTS.  No absolute indication for RRT and the patient appears to be  comfortable. HD tomorrow.  HTN/Volume - BPs low-normal and pt is below dry wt. Metoprolol resumed per cardiology. Cont UF 1-1.5 L next HD.  Anemia of CKD - Hgb 11.2 8/23.  Esa last given 8/5, no esa needs.  8.   MBD ckd - CCa high, vdra on hold. Sensipar resumed. Phos 7, resumed home velphoro 3 ac tid.   Subjective:  patient seen in room. No sob or leg swelling. Doing well with rehab and left sided slowly improving in strength  Objective Vitals:   04/09/22 1517 04/09/22 1950 04/10/22 0453 04/10/22 0500  BP: 111/63 129/63 137/70   Pulse: 68 67 78   Resp: 15 17 17    Temp: 98.5 F (36.9 C) 97.6 F (36.4 C) 98.1 F (36.7 C)   TempSrc:      SpO2: 98% 99% 97%   Weight:    102.4 kg  Height:       Physical Exam General: NAD Heart: RRR, no murmurs, rubs or gallops Lungs: CTA bilaterally Abdomen: obese, soft, non-distended Extremities: No edema b/l lower extremities Neuro: awake, alert Dialysis Access:  LUE AVF + bruit     Recent Labs  Lab 04/04/22 1250  HGB 11.2*  ALBUMIN 3.1*  CALCIUM 9.6  PHOS 7.0*  CREATININE 9.24*  K 4.1   Inpatient medications:  amitriptyline  25 mg Oral QHS   apixaban  5 mg Oral BID   insulin aspart  0-5 Units Subcutaneous QHS   insulin aspart  0-6 Units Subcutaneous TID WC   linagliptin  5 mg Oral Daily    liver oil-zinc oxide   Topical BID   metoprolol succinate  200 mg Oral Daily   pravastatin  40 mg Oral q1800    acetaminophen, bisacodyl, diphenhydrAMINE, guaiFENesin-dextromethorphan, mouth rinse, polyethylene glycol, prochlorperazine **OR** prochlorperazine **OR** prochlorperazine, traMADol

## 2022-04-10 NOTE — Progress Notes (Signed)
Occupational Therapy Discharge Summary  Patient Details  Name: Cindy Osborne MRN: [id number] Date of Birth: 02-09-1966  Date of Discharge from OT service:April 10, 2022  Today's Date: 04/10/2022 OT Individual Time: 8315-1761 OT Individual Time Calculation (min): 60 min   Pt received in w/c ready for planned family education. At the start of the session, she received a phone call that she would be discharging today to move to a SNF.  She was expecting this to happen within the next week but is agreeable to going today.  Pt initially very emotional about the numerous struggles she has with her multiple medical problems.  Spent time encouraging pt and pointing out the progress she has made with her LUE since admission.  Pt taken to the gym to focus on LUE NMR with used of dowel bar, stacking cones with hand over hand A and towel table top slides. She has made great progress with her AROM but needs continual cues to attend to arm due to decreased attention, L HH (visual deficits).  Pt encouraged to spend time focusing on hand. She continues to have some mild edema. Did retrograde massage and reminded her to keep arm elevated. Pt returned to room. Pt with RN in the room.    Patient has met 1 of 11 long term goals due to improved balance, improved attention, and improved awareness.  Patient to discharge at [facility] Max Assist level.  Patient's care partner unavailable to provide the necessary physical and cognitive assistance at discharge.    Reasons goals not met: Pt's goals were initially set at a higher Min A level.  At the Progress note that was planned to be done today, her LTGs were going to be downgraded as she was anticipated to stay in rehab until 04/17/22.  She had an early discharge of a full week due to a bed opening at a SNF in which she could receive her dialysis.   Recommendation:  Patient will benefit from ongoing skilled OT services in skilled nursing facility setting to continue to  advance functional skills in the area of BADL.  Equipment: No equipment provided  Reasons for discharge: discharge from hospital to move to a SNF  Patient/family agrees with progress made and goals achieved: Yes  OT Discharge Precautions/Restrictions  Precautions Precautions: Fall Precaution Comments: L hemiplegia, Bil BKA 2 years ago and 1.5 years ago Other Brace: bil prosthetic Restrictions Weight Bearing Restrictions: No   Pain Pain Assessment Pain Scale: 0-10 Pain Score: 5  Pain Type: Acute pain Pain Location: Elbow Pain Orientation: Left Pain Intervention(s): Medication (See eMAR) ADL ADL Eating: Modified independent Grooming: Minimal assistance Where Assessed-Grooming: Sitting at sink, Wheelchair Upper Body Bathing: Minimal assistance Where Assessed-Upper Body Bathing: Shower Lower Body Bathing: Maximal assistance (Pt able to bathe 1/3 LB parts while seated on wide BSC in the shower. Pt able to bathe RLE residual limg, however, requires assistance to bathe L residual limb & buttocks/peri-area.) Where Assessed-Lower Body Bathing: Shower Upper Body Dressing: Moderate assistance (Pt requires assistance to fully manage LUE to don through sleeve and intermittent assist to fully pull over head and down trunk. However, pt able to don a pull-over style shirt with moderate assistance and maximal VC's. Pt unable to recall heni-tech.) Where Assessed-Upper Body Dressing:  (BSC in the shower after bathing task) Lower Body Dressing: Maximal assistance (Pt requires assistance to thread B residuial limbs through underwear/pants and to pull up over buttocks when standing. Pt able to participate in pulling clothing up over  BLE while seated. Pt requires maximal assistance to don/doff BLE prosthetics.) Where Assessed-Lower Body Dressing:  (wide BSC placed in the shower after bathing task) Toileting: Dependent (Pt demo's minimal incontinence of bowels while seated on BSC in the shower and  requires total assistance to perform toileting hygiene and clothing management while standing in the [place] with CGA.) Where Assessed-Toileting:  (BSC in shower) Toilet Transfer: Dependent (Pt requires use of SARA lift to transfer from EOB > wide BSC placed over the toilet > PWC. Pt able to perform sit <> stand in SARA lift with minimal assist for force production on L side. Pt demos fair attention to LUE to place on the SARA lift.) Toilet Transfer Method:  (SARA lift) Toilet Transfer Equipment: Extra wide bedside commode (placed over the toilet) Gaffer Transfer: Dependent (Pt able to perform sit <> stand in stedy lift with minimal assistance for force production and total assist via lift to transport from EOB > BSC placed in the shower > standard w/c.) Social research officer, government Method:  Charlaine Dalton) Walk-In Shower Equipment: Grab bars (wide BSC placed in the shower) ADL Comments: Pt has difficulty recalling hemi dressing techniques and has limited attention to L side. Pt is not safe to do slide boards to the [facility] at this time, therefore have utilized [employer] Vision Baseline Vision/History: 0 No visual deficits Patient Visual Report: No change from baseline Eye Alignment: Within Functional Limits Ocular Range of Motion: Within Functional Limits Alignment/Gaze Preference: Gaze right Tracking/Visual Pursuits: Decreased smoothness of eye movement to LEFT superior field;Decreased smoothness of eye movement to LEFT inferior field;Unable to hold eye position out of midline;Requires cues, head turns, or add eye shifts to track Saccades: Additional head turns occurred during testing Visual Fields: Left visual field deficit;Left homonymous hemianopsia Perception  Perception: Impaired Inattention/Neglect: Does not attend to left visual field;Does not attend to left side of body Praxis Praxis: Impaired Praxis Impairment Details: Motor planning Cognition Cognition Overall Cognitive Status:  Impaired/Different from baseline Arousal/Alertness: Awake/alert Orientation Level: Person;Place;Situation Person: Oriented Place: Oriented Situation: Oriented Memory: Impaired Memory Impairment: Decreased recall of new information;Decreased short term memory Brief Interview for Mental Status (BIMS) Repetition of Three Words (First Attempt): 3 Temporal Orientation: Year: Correct Temporal Orientation: Month: Accurate within 5 days Temporal Orientation: Day: Correct Recall: "Sock": Yes, no cue required Recall: "Blue": Yes, no cue required Recall: "Bed": Yes, no cue required BIMS Summary Score: 15 Sensation Sensation Light Touch: Appears Intact Hot/Cold: Appears Intact Proprioception: Impaired by gross assessment;Impaired Detail Proprioception Impaired Details: Impaired LUE;Impaired LLE Stereognosis: Impaired by gross assessment Coordination Gross Motor Movements are Fluid and Coordinated: No Fine Motor Movements are Fluid and Coordinated: No Coordination and Movement Description: L hemi UE>LE Finger Nose Finger Test: unable to perform on LUE, but can bring hand to chin Motor  Motor Motor: Hemiplegia;Abnormal postural alignment and control Motor - Skilled Clinical Observations: L hemi, B amputee Motor - Discharge Observations: L hemi, B amputee Mobility    In rehab, used the [employer] lift with B prosthetics to transport pt.  Pt started to learn transfers with a slide board but NOT safe to use to a BSC at this time.  Trunk/Postural Assessment  Postural Control Postural Control: Deficits on evaluation  Balance Static Sitting Balance Static Sitting - Level of Assistance: 5: Stand by assistance Dynamic Sitting Balance Dynamic Sitting - Level of Assistance: 5: Stand by assistance Extremity/Trunk Assessment RUE Assessment RUE Assessment: Within Functional Limits LUE Assessment Active Range of Motion (AROM) Comments:  sh flex 30, elbow flexion 90, wrist extension 30, 75% finger  extension, 3/5 grasp General Strength Comments: synergy developing LUE Body System: Neuro Brunstrum levels for arm and hand: Arm;Hand Brunstrum level for arm: Stage III Synergy is performed voluntarily Brunstrum level for hand: Stage III Synergies performed voluntarily   [facility] 04/10/2022, 12:43 PM

## 2022-04-10 NOTE — Progress Notes (Signed)
Contacted by rehab CSW that pt will d/c to snf today. Contacted [facility] [address] and spoke to [place]. Clinic advised that pt will d/c to snf today ([facility] name provided) and pt will resume care tomorrow. D/C summary and last renal note faxed to clinic for continuation of care.   Melven Sartorius Renal Navigator [phone]

## 2022-04-10 NOTE — Progress Notes (Signed)
Inpatient Rehabilitation Discharge Medication Review by a Pharmacist  A complete drug regimen review was completed for this patient to identify any potential clinically significant medication issues.  High Risk Drug Classes Is patient taking? Indication by Medication  Antipsychotic Yes Compazine - prn N/V  Anticoagulant Yes Eliquis - Afib  Antibiotic No   Opioid Yes Tramadol - prn pain  Antiplatelet No   Hypoglycemics/insulin Yes Linagliptin - DM  Vasoactive Medication Yes Metoprolol - Afib  Chemotherapy No   Other Yes Cinacalcet - HD, high phos Amitriptyline - mood, sleep Pravastatin - HLD     Type of Medication Issue Identified Description of Issue Recommendation(s)  Drug Interaction(s) (clinically significant)     Duplicate Therapy     Allergy     No Medication Administration End Date     Incorrect Dose     Additional Drug Therapy Needed     Significant med changes from prior encounter (inform family/care partners about these prior to discharge).    Other       Clinically significant medication issues were identified that warrant physician communication and completion of prescribed/recommended actions by midnight of the next day:  No  Time spent performing this drug regimen review (minutes): 30 minutes   Tad Moore 04/10/2022 11:31 AM

## 2022-04-11 NOTE — Progress Notes (Signed)
Inpatient Rehabilitation Care Coordinator Discharge Note   Patient Details  Name: Diamonds Lippard MRN: [id number] Date of Birth: 08/28/1965   Discharge location: D/c to SNF- [facility]  Length of Stay: 16 days  Discharge activity level: Max Asst  Home/community participation: Limited  Patient response SQ:SYPZXA Literacy - How often do you need to have someone help you when you read instructions, pamphlets, or other written material from your doctor or pharmacy?: Never  Patient response QW:BEQUHK Isolation - How often do you feel lonely or isolated from those around you?: Never  Services provided included: MD, RD, PT, OT, SLP, RN, CM, TR, Pharmacy, Neuropsych, SW  Financial Services:  Charity fundraiser Utilized: [employer]  Choices offered to/list presented to: Yes  Follow-up services arranged:   Patient response to transportation need: Is the patient able to respond to transportation needs?: Yes In the past 12 months, has lack of transportation kept you from medical appointments or from getting medications?: No In the past 12 months, has lack of transportation kept you from meetings, work, or from getting things needed for daily living?: No   Comments (or additional information):  Patient/Family verbalized understanding of follow-up arrangements:  Yes  Individual responsible for coordination of the follow-up plan: contact pt sister Ivin Booty  Confirmed correct DME delivered: Rana Snare 04/11/2022    Rana Snare

## 2022-04-12 ENCOUNTER — Telehealth: Payer: Self-pay | Admitting: Registered Nurse

## 2022-04-12 NOTE — Telephone Encounter (Signed)
Ms. Hoheisel was discharged to SNF: [facility]  Call placed to [facility], spoke to : The Director of Nursing.  Appointment Information was Given. She verbalizes understanding. She was also given [facility] Neurology Appointment.

## 2022-04-21 ENCOUNTER — Encounter: Payer: Self-pay | Admitting: Registered Nurse

## 2022-04-21 ENCOUNTER — Encounter: Payer: Medicare Other | Attending: Registered Nurse | Admitting: Registered Nurse

## 2022-04-21 VITALS — BP 131/63 | HR 69 | Ht 69.0 in | Wt 223.0 lb

## 2022-04-21 DIAGNOSIS — E1169 Type 2 diabetes mellitus with other specified complication: Secondary | ICD-10-CM | POA: Insufficient documentation

## 2022-04-21 DIAGNOSIS — I63511 Cerebral infarction due to unspecified occlusion or stenosis of right middle cerebral artery: Secondary | ICD-10-CM | POA: Diagnosis not present

## 2022-04-21 DIAGNOSIS — E785 Hyperlipidemia, unspecified: Secondary | ICD-10-CM | POA: Diagnosis present

## 2022-04-21 DIAGNOSIS — E118 Type 2 diabetes mellitus with unspecified complications: Secondary | ICD-10-CM | POA: Insufficient documentation

## 2022-04-21 DIAGNOSIS — N186 End stage renal disease: Secondary | ICD-10-CM | POA: Insufficient documentation

## 2022-04-21 DIAGNOSIS — Z992 Dependence on renal dialysis: Secondary | ICD-10-CM | POA: Diagnosis present

## 2022-04-21 DIAGNOSIS — I639 Cerebral infarction, unspecified: Secondary | ICD-10-CM | POA: Diagnosis not present

## 2022-04-21 NOTE — Progress Notes (Unsigned)
Subjective:    Patient ID: Cindy Osborne, female    DOB: [birth year], 56 y.o.   MRN: [id number]  HPI: Cindy Osborne is a 56 y.o. female who returns for follow up appointment for chronic pain and medication refill. states *** pain is located in  ***. rates pain ***. current exercise regime is walking and performing stretching exercises.    Pain Inventory Average Pain 6 Pain Right Now  . My pain is dull  LOCATION OF PAIN  Hip  BOWEL Number of stools per week: 2    BLADDER Dialysis     Mobility walk with assistance use a walker ability to climb steps?  no do you drive?  no use a wheelchair needs help with transfers Do you have any goals in this area?  yes  Function not employed: date last employed . disabled: date disabled .  Neuro/Psych tingling trouble walking confusion anxiety  Prior Studies Any changes since last visit?  no  Physicians involved in your care Any changes since last visit?  {REFRESHABLE YES [id number]}   Family History  Problem Relation Age of Onset   Diabetes Mother    Diabetes Sister    Social History   Socioeconomic History   Marital status: Married    Spouse name: Not on file   Number of children: Not on file   Years of education: Not on file   Highest education level: Not on file  Occupational History   Not on file  Tobacco Use   Smoking status: Never   Smokeless tobacco: Never  Vaping Use   Vaping Use: Never used  Substance and Sexual Activity   Alcohol use: Yes   Drug use: Never   Sexual activity: Yes    Partners: Male  Other Topics Concern   Not on file  Social History Narrative   Not on file   Social Determinants of Health   Financial Resource Strain: Low Risk  (02/06/2022)   Overall Financial Resource Strain (CARDIA)    Difficulty of Paying Living Expenses: Not hard at all  Food Insecurity: No Food Insecurity (02/06/2022)   Hunger Vital Sign    Worried About Running Out of Food in the Last Year:  Never true    [place] in the Last Year: Never true  Transportation Needs: No Transportation Needs (02/06/2022)   PRAPARE - Hydrologist (Medical): No    Lack of Transportation (Non-Medical): No  Physical Activity: Not on file  Stress: Not on file  Social Connections: Not on file   Past Surgical History:  Procedure Laterality Date   angioplasty politeal Left 07/2020   avg for dialysis     CHOLECYSTECTOMY     COLONOSCOPY  08/15/2011   cleared for 10 yrs   IR CT HEAD LTD  01/21/2022   IR CT HEAD LTD  03/21/2022   IR PERCUTANEOUS ART THROMBECTOMY/INFUSION INTRACRANIAL INC DIAG ANGIO  01/21/2022   IR PERCUTANEOUS ART THROMBECTOMY/INFUSION INTRACRANIAL INC DIAG ANGIO  03/21/2022   IR US GUIDE VASC ACCESS RIGHT  01/21/2022   IR US GUIDE VASC ACCESS RIGHT  03/21/2022   LEG AMPUTATION BELOW KNEE Right 04/2019   LEG AMPUTATION BELOW KNEE Left 09/08/2020   RADIOLOGY WITH ANESTHESIA N/A 01/21/2022   Procedure: IR WITH ANESTHESIA;  Surgeon: Radiologist, Medication, MD;  Location: [place];  Service: Radiology;  Laterality: N/A;   RADIOLOGY WITH ANESTHESIA N/A 03/21/2022   Procedure: IR WITH ANESTHESIA;  Surgeon: Radiologist, Medication, MD;  Location: [place];  Service: Radiology;  Laterality: N/A;   TOE AMPUTATION Right 10/15/2018   Past Medical History:  Diagnosis Date   Amputated toe ([place]) 01/31/2012   Right second toe distal phalanx Right great toe   Diabetes mellitus without complication ([place])    History of diabetic ulcer of foot 04/01/2014   Hyperlipidemia    Hypertension    Stroke ([place]) 01/21/2022   There were no vitals taken for this visit.  Opioid Risk Score:   Fall Risk Score:  `1  Depression screen PHQ 2/9     04/21/2022    1:03 PM 02/06/2022    2:06 PM 09/14/2021    2:42 PM 06/09/2021   11:03 AM 03/03/2021    8:29 AM 10/26/2020    3:53 PM 07/28/2020    9:31 AM  Depression screen PHQ 2/9  Decreased Interest 3 1 0 0 0 0 0  Down, Depressed, Hopeless 0  0 0 0 0 0 0  PHQ - 2 Score 3 1 0 0 0 0 0  Altered sleeping 0 0 3 3 0 0 0  Tired, decreased energy 2 0 0 0 0 0 0  Change in appetite 0 0 0 0 0 0 0  Feeling bad or failure about yourself  2 0 0 0 0 0 0  Trouble concentrating 2 0 0 0 0 0 0  Moving slowly or fidgety/restless 3 0 0 0 0 0 0  Suicidal thoughts 0 0 0 0 0 0 0  PHQ-9 Score 12 1 3 3  0 0 0  Difficult doing work/chores Somewhat difficult Not difficult at all Not difficult at all Not difficult at all Not difficult at all Not difficult at all      Review of Systems  Constitutional:  Positive for diaphoresis.  Skin:  Positive for rash.  All other systems reviewed and are negative.     Objective:   Physical Exam        Assessment & Plan:

## 2022-04-23 ENCOUNTER — Encounter: Payer: Self-pay | Admitting: Registered Nurse

## 2022-04-27 ENCOUNTER — Other Ambulatory Visit: Payer: Self-pay

## 2022-04-27 NOTE — Patient Outreach (Signed)
  Care Coordination   04/27/2022 Name: Cindy Osborne MRN: [id number] DOB: 05-02-1966   First telephone outreach attempt to obtain mRS. Patient is currently in treatment asked that I call back later today.  Philmore Pali [facility] Management Assistant [phone]

## 2022-04-28 ENCOUNTER — Telehealth: Payer: Self-pay | Admitting: Internal Medicine

## 2022-04-28 NOTE — Telephone Encounter (Signed)
Gave verbal orders for Dr [name]. Patient will be seen by them starting next week and will send ordered after first visit.  - Cindy Osborne

## 2022-04-28 NOTE — Telephone Encounter (Signed)
Tresea Mall calling from [facility] is calling to verify that Dr. [name] will follow orders OT, PT, Speech. Will call back with the frequency. Please advise CB- [phone] verbal ok on VM

## 2022-05-03 ENCOUNTER — Other Ambulatory Visit: Payer: Self-pay

## 2022-05-03 NOTE — Patient Outreach (Signed)
  Care Coordination   05/03/2022 Name: Cindy Osborne MRN: [id number] DOB: 10/28/1965    Telephone outreach to patient to obtain mRS was successfully completed. MRS= 2  [place] [facility] Management Assistant [phone]

## 2022-05-04 ENCOUNTER — Telehealth: Payer: Self-pay | Admitting: Internal Medicine

## 2022-05-04 NOTE — Telephone Encounter (Signed)
Copied from [place] [phone]. Topic: General - Other >> May 04, 2022 11:09 AM [place] B wrote: Reason for FYB:[id number] call in needs electric wheelchair, because she is an amputee.

## 2022-05-04 NOTE — Telephone Encounter (Signed)
Called pt let her know that she would need to see Dr. Posey Pronto (ortho surgeon). Pt verbalized understanding.  KP

## 2022-05-05 ENCOUNTER — Other Ambulatory Visit: Payer: Self-pay | Admitting: Internal Medicine

## 2022-05-05 ENCOUNTER — Other Ambulatory Visit: Payer: Self-pay

## 2022-05-05 NOTE — Telephone Encounter (Signed)
Discontinued 03/25/2022. Not on med list.  Requested Prescriptions  Pending Prescriptions Disp Refills  . aspirin 81 MG chewable tablet [Pharmacy Med Name: [facility] ASPIRIN 81 MG CHEWABLE TAB] 70 tablet 1    Sig: CHEW 1 TABLET BY MOUTH DAILY.     Analgesics:  NSAIDS - aspirin Failed - 05/05/2022  2:03 AM      Failed - Cr in normal range and within 360 days    Creatinine, Ser  Date Value Ref Range Status  04/04/2022 9.24 (H) 0.44 - 1.00 mg/dL Final         Failed - eGFR is 10 or above and within 360 days    GFR calc Af Amer  Date Value Ref Range Status  02/02/2021 4  Final   GFR, Estimated  Date Value Ref Range Status  04/04/2022 5 (L) >60 mL/min Final    Comment:    (NOTE) Calculated using the CKD-EPI Creatinine Equation (2021)          Passed - Patient is not pregnant      Passed - Valid encounter within last 12 months    Recent Outpatient Visits          2 months ago Acute right arterial ischemic stroke, MCA (middle cerebral artery) ([place])   [place] [facility] at [facility], [name], MD   7 months ago Acute non-recurrent maxillary sinusitis   [place] [facility] at [facility], [name], MD   11 months ago Mood disorder [facility])   [place] [facility] at [facility], [name], MD   1 year ago Annual physical exam   [facility] Medicine at [facility], [name], MD   1 year ago Environmental and seasonal allergies   [place] [facility] at [facility], [name], MD      Future Appointments            In 1 week [place], Earlean Polka, MD [facility]

## 2022-05-08 ENCOUNTER — Ambulatory Visit (INDEPENDENT_AMBULATORY_CARE_PROVIDER_SITE_OTHER): Payer: Medicare Other | Admitting: Internal Medicine

## 2022-05-08 ENCOUNTER — Other Ambulatory Visit: Payer: Self-pay

## 2022-05-08 ENCOUNTER — Telehealth: Payer: Self-pay | Admitting: Internal Medicine

## 2022-05-08 ENCOUNTER — Encounter: Payer: Self-pay | Admitting: Internal Medicine

## 2022-05-08 VITALS — BP 122/78 | HR 67 | Ht 69.0 in | Wt 223.0 lb

## 2022-05-08 DIAGNOSIS — I6930 Unspecified sequelae of cerebral infarction: Secondary | ICD-10-CM

## 2022-05-08 DIAGNOSIS — R918 Other nonspecific abnormal finding of lung field: Secondary | ICD-10-CM

## 2022-05-08 DIAGNOSIS — R1310 Dysphagia, unspecified: Secondary | ICD-10-CM | POA: Diagnosis not present

## 2022-05-08 DIAGNOSIS — F39 Unspecified mood [affective] disorder: Secondary | ICD-10-CM

## 2022-05-08 DIAGNOSIS — E041 Nontoxic single thyroid nodule: Secondary | ICD-10-CM

## 2022-05-08 DIAGNOSIS — Z23 Encounter for immunization: Secondary | ICD-10-CM | POA: Diagnosis not present

## 2022-05-08 DIAGNOSIS — I639 Cerebral infarction, unspecified: Secondary | ICD-10-CM

## 2022-05-08 DIAGNOSIS — Z89612 Acquired absence of left leg above knee: Secondary | ICD-10-CM | POA: Insufficient documentation

## 2022-05-08 DIAGNOSIS — I130 Hypertensive heart and chronic kidney disease with heart failure and stage 1 through stage 4 chronic kidney disease, or unspecified chronic kidney disease: Secondary | ICD-10-CM

## 2022-05-08 DIAGNOSIS — L8991 Pressure ulcer of unspecified site, stage 1: Secondary | ICD-10-CM

## 2022-05-08 NOTE — Progress Notes (Signed)
Date:  05/08/2022   Name:  Cindy Osborne   DOB:  Aug 02, 1966   MRN:  [id number]   Chief Complaint: Follow-up and Immunizations (FLU SHOT) Follow up from CVA. admitted on 03/21/22 with fall and onset of left sided weakness with left hemineglect and left facial droop with slurred speech. CTA head/neck showed acute right M2/MCA occlusion with 21 ml penumbra. Stroke felt to be cardioembolic with plans for cardiac work up but she developed A fib with RVR requiring Cardizem for rate control. Dr. Stanford Breed recommended Eliquis and she converted to NSR.  Therapy was limited by left sided weakness, B-BKA, impulsivity with poor safety awareness.   Admitted to Rehab. Xrays of left hip and shoulder were done due to reports of pain after fall PTA. Hip films showed mild degenerative changes and mild to moderate OA visible on Left shoulder films.  Her respiratory status has been stable without any cough or shortness of breath and recommend CT chest in couple weeks for follow-up on nodular densities.    CTA  03/21/22: MPRESSION: 1. Acute right M2/MCA posterior division occlusion. 2. A 21 mL of ischemic penumbra right identified. 3. Scattered opacities in the bilateral lung apices, likely infectious in nature. However, given nodular appearance, follow-up chest CT is recommended. 4. A 2.4 cm thyroid lobe nodule.   She did develop stage 2 area of breakdown on left buttock which has been managed with use of Desitin as well as pressure relief measures.    Disposition:  Skilled Nursing facility. [facility] in [place]. 04/10/22   Diet: Dysphagia 3, Renal diet with 1200 cc /FR.  Thin liquids   Special Instructions: Will need CT chest to follow up on nodular densities.  Monitor BS ac/hs and use SSI per protocol.  Please document daily weights with strict I/O.  4. Use ice and/or heat prn for left shoulder/hip pain.   HPI CVA - most recent stroke has been the most debilitating.  She returned home from  rehab last week.  She has her mother and her sister at home helping.  She is using a manual wheelchair to get around but has to be pushed due to LUE severe weakness.  She wears her prosthetic legs but only stands to pivot.  She is unable to walk due to balance and left leg weakness.  She is interested in getting a motorized wheel chair.  This would allow her to get to the bed and bath and kitchen.  She can obtain drinks and food on her own but still need help with toileting and bathing and getting into bed.  Pulmonary nodules - see on CTA.  No history of smoking, no shortness of breath or cough.  Dedicated CT of the chest recommended.  Thyroid nodule - also seen on CTA.  She denies any neck fullness or mass. She has dysphagia from the stroke and has to position her head in a way that permits safe swallowing.   Pressure ulcer - on buttock, healing per recent PA evaluation at discharge.  Pt has a gel cushion for her chair and to use in dialysis.  Lab Results  Component Value Date   NA 130 (L) 04/04/2022   K 4.1 04/04/2022   CO2 23 04/04/2022   GLUCOSE 208 (H) 04/04/2022   BUN 65 (H) 04/04/2022   CREATININE 9.24 (H) 04/04/2022   CALCIUM 9.6 04/04/2022   GFRNONAA 5 (L) 04/04/2022   Lab Results  Component Value Date   CHOL 107 03/22/2022  HDL 35 (L) 03/22/2022   LDLCALC 55 03/22/2022   TRIG 84 03/22/2022   CHOLHDL 3.1 03/22/2022   Lab Results  Component Value Date   TSH 1.956 03/25/2022   Lab Results  Component Value Date   HGBA1C 6.0 (H) 01/22/2022   Lab Results  Component Value Date   WBC 7.1 04/04/2022   HGB 11.2 (L) 04/04/2022   HCT 34.6 (L) 04/04/2022   MCV 85.9 04/04/2022   PLT 263 04/04/2022   Lab Results  Component Value Date   ALT 15 03/27/2022   AST 16 03/27/2022   ALKPHOS 110 03/27/2022   BILITOT 0.7 03/27/2022   No results found for: "25OHVITD2", "25OHVITD3", "VD25OH"   Review of Systems  Constitutional:  Negative for chills, fatigue and fever.  HENT:   Positive for trouble swallowing.   Respiratory:  Negative for cough, chest tightness, shortness of breath and wheezing.   Cardiovascular:  Negative for chest pain.  Gastrointestinal:  Positive for diarrhea, nausea and vomiting.  Musculoskeletal:  Positive for arthralgias (hip).  Skin:  Negative for color change and rash.  Neurological:  Positive for weakness. Negative for dizziness, numbness and headaches.  Psychiatric/Behavioral:  Positive for dysphoric mood. Negative for sleep disturbance. The patient is not nervous/anxious.     Patient Active Problem List   Diagnosis Date Noted   History of stroke with current residual effects 05/08/2022   Mood disorder ([place]) 05/08/2022   Pressure injury of skin 04/05/2022   Stroke (cerebrum) ([place]) 03/21/2022   Hyperlipidemia 01/24/2022   Obesity 01/24/2022   Acute right arterial ischemic stroke, MCA (middle cerebral artery) ([place]) 01/21/2022   Combined forms of age-related cataract of both eyes 06/30/2021   Diabetic macular edema ([place]) 06/30/2021   Proteinuria 40/98/1191   Metabolic acidosis 47/82/9562   Encounter for insertion of mirena IUD 03/15/2021   Panic disorder 03/03/2021   Acquired absence of left leg below knee ([place]) 10/26/2020   PVD (peripheral vascular disease) ([place]) 07/28/2020   Uncontrolled type 2 diabetes mellitus with diabetic peripheral angiopathy without gangrene 02/26/2020   Acquired absence of right leg below knee ([place]) 07/28/2019   Diabetic retinopathy ([place]) 11/23/2016   Slow transit constipation 09/24/2015   Environmental and seasonal allergies 07/09/2015   Anemia associated with chronic renal failure 07/09/2015   ESRD (end stage renal disease) on dialysis ([place]) 07/09/2015   Type II diabetes mellitus with complication ([place]) 13/03/6577   Herpes simplex infection 07/09/2015   Hyperlipidemia associated with type 2 diabetes mellitus ([place]) 07/09/2015   Heart & renal disease, hypertensive, with heart failure ([place]) 07/09/2015    Arthritis of knee, degenerative 07/09/2015   Tendinitis 07/09/2015   Avitaminosis D 07/09/2015   Obstructive apnea 12/15/2014   Intracervical pessary 01/07/2014   Essential hypertension 09/11/2013    Allergies  Allergen Reactions   Augmentin [Amoxicillin-Pot Clavulanate] Shortness Of Breath   Atorvastatin Other (See Comments)    Back pain   Pioglitazone Other (See Comments)    Edema   Peanut-Containing Drug Products Itching   Rosuvastatin Itching and Other (See Comments)    Back pain   Shellfish-Derived Products Itching and Rash    shrimp   Sulfa Antibiotics Rash and Other (See Comments)    Shedding of skin    Past Surgical History:  Procedure Laterality Date   angioplasty politeal Left 07/2020   avg for dialysis     CHOLECYSTECTOMY     COLONOSCOPY  08/15/2011   cleared for 10 yrs   IR CT HEAD LTD  01/21/2022   IR CT HEAD LTD  03/21/2022   IR PERCUTANEOUS ART THROMBECTOMY/INFUSION INTRACRANIAL INC DIAG ANGIO  01/21/2022   IR PERCUTANEOUS ART THROMBECTOMY/INFUSION INTRACRANIAL INC DIAG ANGIO  03/21/2022   IR US GUIDE VASC ACCESS RIGHT  01/21/2022   IR US GUIDE VASC ACCESS RIGHT  03/21/2022   LEG AMPUTATION BELOW KNEE Right 04/2019   LEG AMPUTATION BELOW KNEE Left 09/08/2020   RADIOLOGY WITH ANESTHESIA N/A 01/21/2022   Procedure: IR WITH ANESTHESIA;  Surgeon: Radiologist, Medication, MD;  Location: [facility];  Service: Radiology;  Laterality: N/A;   RADIOLOGY WITH ANESTHESIA N/A 03/21/2022   Procedure: IR WITH ANESTHESIA;  Surgeon: Radiologist, Medication, MD;  Location: [facility];  Service: Radiology;  Laterality: N/A;   TOE AMPUTATION Right 10/15/2018    Social History   Tobacco Use   Smoking status: Never   Smokeless tobacco: Never  Vaping Use   Vaping Use: Never used  Substance Use Topics   Alcohol use: Yes   Drug use: Never     Medication list has been reviewed and updated.  Current Meds  Medication Sig   acetaminophen (TYLENOL) 325 MG tablet Take 1-2 tablets (325-650 mg  total) by mouth every 4 (four) hours as needed for mild pain.   amitriptyline (ELAVIL) 25 MG tablet Take 25 mg by mouth at bedtime.   apixaban (ELIQUIS) 5 MG TABS tablet Take 1 tablet (5 mg total) by mouth 2 (two) times daily.   cinacalcet (SENSIPAR) 30 MG tablet Take 3 tablets (90 mg total) by mouth Every Tuesday,Thursday,and Saturday with dialysis.   linagliptin (TRADJENTA) 5 MG TABS tablet Take 1 tablet (5 mg total) by mouth daily.   liver oil-zinc oxide (DESITIN) 40 % ointment Apply topically 2 (two) times daily. To buttocks   metoprolol (TOPROL-XL) 200 MG 24 hr tablet Take 1 tablet (200 mg total) by mouth daily. Take with or immediately following a meal.   pravastatin (PRAVACHOL) 40 MG tablet Take 1 tablet (40 mg total) by mouth daily at 6 PM.       05/08/2022    9:50 AM 02/06/2022    2:07 PM 09/14/2021    2:43 PM 06/09/2021   11:03 AM  GAD 7 : Generalized Anxiety Score  Nervous, Anxious, on Edge 0 1 3 3   Control/stop worrying 0 0 0 0  Worry too much - different things 0 0 1 1  Trouble relaxing 0 0 3 3  Restless 0 0 3 3  Easily annoyed or irritable 0 0 3 3  Afraid - awful might happen 0 0 0 0  Total GAD 7 Score 0 1 13 13   Anxiety Difficulty Not difficult at all Not difficult at all Not difficult at all Not difficult at all       05/08/2022    9:50 AM 04/21/2022    1:03 PM 02/06/2022    2:06 PM  Depression screen PHQ 2/9  Decreased Interest 2 3 1   Down, Depressed, Hopeless 0 0 0  PHQ - 2 Score 2 3 1   Altered sleeping 1 0 0  Tired, decreased energy 0 2 0  Change in appetite 0 0 0  Feeling bad or failure about yourself  0 2 0  Trouble concentrating 0 2 0  Moving slowly or fidgety/restless 0 3 0  Suicidal thoughts 0 0 0  PHQ-9 Score 3 12 1   Difficult doing work/chores Not difficult at all Somewhat difficult Not difficult at all    BP Readings from Last 3 Encounters:  05/08/22 122/78  04/21/22 131/63  04/10/22 (!) 120/59    Physical Exam Constitutional:       Appearance: Normal appearance.  Neck:     Vascular: No carotid bruit.  Cardiovascular:     Rate and Rhythm: Normal rate and regular rhythm.     Pulses: Normal pulses.  Pulmonary:     Effort: Pulmonary effort is normal.     Breath sounds: No wheezing or rhonchi.  Abdominal:     General: Abdomen is flat.     Palpations: Abdomen is soft.  Musculoskeletal:     Cervical back: Normal range of motion. No tenderness.  Lymphadenopathy:     Cervical: No cervical adenopathy.  Skin:    General: Skin is warm and dry.  Neurological:     Mental Status: She is alert and oriented to person, place, and time.     Comments: LUE 3/5 shoulder/elbow/wrist strength Speech slightly dysarthric     Wt Readings from Last 3 Encounters:  05/08/22 223 lb (101.2 kg)  04/21/22 223 lb (101.2 kg)  04/10/22 225 lb 12 oz (102.4 kg)    BP 122/78   Pulse 67   Ht 5\' 9"  (1.753 m)   Wt 223 lb (101.2 kg)   SpO2 93%   BMI 32.93 kg/m   Assessment and Plan: 1. Nodule of left lobe of thyroid gland Recommend [place] to further investigate TSH normal in August - US THYROID; Future  2. Pulmonary nodules No smoking or occupational exposure history Repeat CT chest  - CT Chest Wo Contrast; Future  3. Dysphagia, unspecified type Continue with ST and maneuvers to prevent aspiriation  4. History of stroke with current residual effects Possibly due to Afib Now in SR but on Eliquis. Letter stating deficits written for insurance forms She may be able to get a motorized chair - she will check first with insurance for coverage.  5. Healing pressure injury, stage 1 Continue gel cushion for pressure relief  6. Heart & renal disease, hypertensive, with heart failure (HCC) BP well controlled  7. Mood disorder ([place]) Due to recent change in health status. Will monitor for worsening.   Partially dictated using Editor, commissioning. Any errors are unintentional.  Halina Maidens, MD [facility]  05/08/2022

## 2022-05-08 NOTE — Telephone Encounter (Signed)
Copied from [place] [phone]. Topic: General - Other >> May 08, 2022  1:16 PM Ludger Nutting wrote: Patient is requesting a motorized wheelchair. Patient spoke with their insurance and a prescription will need to be sent to the medical supply company with her demographics and they will send over the form to be complete. Nadara Mustard with [employer] Fax. [phone]

## 2022-05-08 NOTE — Telephone Encounter (Signed)
Faxed DME to [employer].  - Jeshua Ransford

## 2022-05-11 ENCOUNTER — Encounter: Payer: Self-pay | Admitting: Internal Medicine

## 2022-05-11 DIAGNOSIS — R29898 Other symptoms and signs involving the musculoskeletal system: Secondary | ICD-10-CM | POA: Insufficient documentation

## 2022-05-12 ENCOUNTER — Other Ambulatory Visit: Payer: Self-pay

## 2022-05-12 MED ORDER — AMITRIPTYLINE HCL 25 MG PO TABS: 25.0000 mg | ORAL_TABLET | Freq: Every day | ORAL | 0 refills | Status: DC

## 2022-05-12 NOTE — Telephone Encounter (Signed)
[employer] sent forms to office and niece called to see if they were completed / please advise

## 2022-05-15 ENCOUNTER — Telehealth: Payer: Self-pay | Admitting: Internal Medicine

## 2022-05-15 NOTE — Telephone Encounter (Signed)
Home Health Verbal Orders - Caller/Agency: Santiago Glad with [facility] Number: [phone] Requesting Speech Therapy: Reading comprehension and word finding kind of things Frequency: 1 x a week for 7 weeks but a total of 5 visits as Santiago Glad will be going out of town for 2 weeks at the end of the month

## 2022-05-15 NOTE — Telephone Encounter (Signed)
Called and gave verbal orders.  Cindy Osborne 

## 2022-05-17 ENCOUNTER — Other Ambulatory Visit (INDEPENDENT_AMBULATORY_CARE_PROVIDER_SITE_OTHER): Payer: Medicare Other | Admitting: Internal Medicine

## 2022-05-17 ENCOUNTER — Inpatient Hospital Stay: Payer: Federal, State, Local not specified - PPO | Admitting: Diagnostic Neuroimaging

## 2022-05-17 ENCOUNTER — Encounter: Payer: Self-pay | Admitting: Internal Medicine

## 2022-05-17 DIAGNOSIS — L8991 Pressure ulcer of unspecified site, stage 1: Secondary | ICD-10-CM

## 2022-05-17 DIAGNOSIS — J99 Respiratory disorders in diseases classified elsewhere: Secondary | ICD-10-CM

## 2022-05-17 DIAGNOSIS — Z794 Long term (current) use of insulin: Secondary | ICD-10-CM

## 2022-05-17 DIAGNOSIS — D631 Anemia in chronic kidney disease: Secondary | ICD-10-CM

## 2022-05-17 DIAGNOSIS — Z7984 Long term (current) use of oral hypoglycemic drugs: Secondary | ICD-10-CM

## 2022-05-17 DIAGNOSIS — I739 Peripheral vascular disease, unspecified: Secondary | ICD-10-CM

## 2022-05-17 DIAGNOSIS — Z6837 Body mass index (BMI) 37.0-37.9, adult: Secondary | ICD-10-CM

## 2022-05-17 DIAGNOSIS — I2510 Atherosclerotic heart disease of native coronary artery without angina pectoris: Secondary | ICD-10-CM

## 2022-05-17 DIAGNOSIS — E119 Type 2 diabetes mellitus without complications: Secondary | ICD-10-CM

## 2022-05-17 DIAGNOSIS — E785 Hyperlipidemia, unspecified: Secondary | ICD-10-CM

## 2022-05-17 DIAGNOSIS — F39 Unspecified mood [affective] disorder: Secondary | ICD-10-CM

## 2022-05-17 DIAGNOSIS — I130 Hypertensive heart and chronic kidney disease with heart failure and stage 1 through stage 4 chronic kidney disease, or unspecified chronic kidney disease: Secondary | ICD-10-CM

## 2022-05-17 DIAGNOSIS — E118 Type 2 diabetes mellitus with unspecified complications: Secondary | ICD-10-CM

## 2022-05-17 DIAGNOSIS — E1169 Type 2 diabetes mellitus with other specified complication: Secondary | ICD-10-CM

## 2022-05-17 DIAGNOSIS — N185 Chronic kidney disease, stage 5: Secondary | ICD-10-CM

## 2022-05-17 DIAGNOSIS — Z89512 Acquired absence of left leg below knee: Secondary | ICD-10-CM

## 2022-05-17 DIAGNOSIS — I10 Essential (primary) hypertension: Secondary | ICD-10-CM

## 2022-05-17 DIAGNOSIS — R1310 Dysphagia, unspecified: Secondary | ICD-10-CM

## 2022-05-17 DIAGNOSIS — I6930 Unspecified sequelae of cerebral infarction: Secondary | ICD-10-CM | POA: Diagnosis not present

## 2022-05-17 DIAGNOSIS — Z992 Dependence on renal dialysis: Secondary | ICD-10-CM

## 2022-05-17 DIAGNOSIS — N186 End stage renal disease: Secondary | ICD-10-CM

## 2022-05-17 DIAGNOSIS — Z89511 Acquired absence of right leg below knee: Secondary | ICD-10-CM

## 2022-05-17 NOTE — Progress Notes (Signed)
Received home health orders orders from [facility]. Start of care 05/05/22.   Certification and orders from 05/05/22 through 07/03/22 are reviewed, signed and faxed back to home health company.  Need of intermittent skilled services at home: homebound  The home health care plan has been established by me and will be reviewed and updated as needed to maximize patient recovery.  I certify that all home health services have been and will be furnished to the patient while under my care.  Face-to-face encounter in which the need for home health services was established: 05/08/22.  Patient is receiving home health services for the following diagnoses: Problem List Items Addressed This Visit       Cardiovascular and Mediastinum   Essential hypertension (Chronic)   Heart & renal disease, hypertensive, with heart failure (HCC) (Chronic)   PVD (peripheral vascular disease) (HCC) (Chronic)     Endocrine   Hyperlipidemia associated with type 2 diabetes mellitus (HCC) (Chronic)   Type II diabetes mellitus with complication (HCC) (Chronic)     Genitourinary   ESRD (end stage renal disease) on dialysis ([place]) (Chronic)     Other   Acquired absence of left leg below knee (HCC) (Chronic)   Acquired absence of right leg below knee (HCC) (Chronic)   History of stroke with current residual effects - Primary (Chronic)   Mood disorder ([place])   Other Visit Diagnoses     Dysphagia, unspecified type       Healing pressure injury, stage 1       Anemia of chronic renal failure, stage 5 ([place])       Atherosclerosis of native coronary artery of native heart without angina pectoris       Lung involvement associated with another disorder (HCC)       BMI 37.0-37.9, adult       Diabetes mellitus treated with insulin and oral medication (HCC)            Halina Maidens, MD

## 2022-05-18 ENCOUNTER — Ambulatory Visit: Payer: Self-pay

## 2022-05-18 ENCOUNTER — Ambulatory Visit: Payer: Medicare Other

## 2022-05-18 ENCOUNTER — Telehealth: Payer: Self-pay | Admitting: Internal Medicine

## 2022-05-18 NOTE — Telephone Encounter (Signed)
  Chief Complaint: Anxiety  Symptoms: tired, fatigued, mentally overwhelmed, wanting to go back to Assisted living  Frequency: ongoing for several days  Pertinent Negatives: Patient denies SI or HI Disposition: [] ED /[] Urgent Care (no appt availability in office) / [] Appointment(In office/virtual)/ []  [facility]/ [] Home Care/ [] Refused Recommended Disposition /[] [facility]/ [x]  Follow-up with PCP Additional Notes: pt states that she called and talked to triage after hours last night. Didn't see any documentation but pt is wanting to go back to [facility] living so she can get the proper care she needs. Pt is unable to take care of herself since she had bilat amputee and CVAs. Pt states she is miserable and just wants to go somewhere where her family doesn't have to take care of her. I advised her I would send HP message to provider. I did educate if she has any SI to please call 911 for help. Pt verbalized understanding.   Reason for Disposition  MODERATE anxiety (e.g., persistent or frequent anxiety symptoms; interferes with sleep, school, or work)  Answer Assessment - Initial Assessment Questions 1. CONCERN: "Did anything happen that prompted you to call today?"      Pt is tired, fatigued, and mentally overwhelmed 2. ANXIETY SYMPTOMS: "Can you describe how you (your loved one; patient) have been feeling?" (e.g., tense, restless, panicky, anxious, keyed up, overwhelmed, sense of impending doom).      overwhelmed 3. ONSET: "How long have you been feeling this way?" (e.g., hours, days, weeks)     Ongoing for several days 4. SEVERITY: "How would you rate the level of anxiety?" (e.g., 0 - 10; or mild, moderate, severe).     moderate  5. FUNCTIONAL IMPAIRMENT: "How have these feelings affected your ability to do daily activities?" "Have you had more difficulty than usual doing your normal daily activities?" (e.g., getting better, same, worse; self-care, school,  work, interactions)     Self care getting neglected d/t pt unable to provide self care   7. RISK OF HARM - SUICIDAL IDEATION: "Do you ever have thoughts of hurting or killing yourself?" If Yes, ask:  "Do you have these feelings now?" "Do you have a plan on how you would do this?"     no 8. TREATMENT:  "What has been done so far to treat this anxiety?" (e.g., medicines, relaxation strategies). "What has helped?"     Taking amitriptyline to go to sleep   11. PATIENT SUPPORT: "Who is with you now?" "Who do you live with?" "Do you have family or friends who you can talk to?"        Sister and mom currently helping provide care but they are exhausted too  Protocols used: Anxiety and Panic Attack-A-AH

## 2022-05-18 NOTE — Telephone Encounter (Signed)
Pt is calling to report to Dr. [name] that she wanting to go back to [facility]. She reports that she is at home and have family is not taking her of her. Pt reports that she took 2  amitriptyline (ELAVIL) 25 MG tablet [[id number]] last night to go to sleep. Called an spoke to after hours nurse- per the patient, advised not to 2 pills. Pt states that she just wanted to go to sleep last night. Pt reports that she is not doing well mentally. Pt was transferred to [facility]. Please advise

## 2022-05-19 NOTE — Telephone Encounter (Signed)
Called and spoke with [facility] per Dr [name]- asked them to send out a social worker and a nurse ASAP to evaluate the patient. They put in orders. Called patient to inform her and nurse was with her currently from [place]. Spoke with the nurse and informed her of the orders I put in for the patient.  - Tylerjames Hoglund

## 2022-05-19 NOTE — Telephone Encounter (Signed)
Called and spoke with [facility] per Dr [name]- asked them to send out a social worker and a nurse ASAP to evaluate the patient. They put in orders. Called patient to inform her and nurse was with her currently from [place]. Spoke with the nurse and informed her of the orders I put in for the patient.  - Evelyn Moch

## 2022-05-23 ENCOUNTER — Emergency Department
Admission: EM | Admit: 2022-05-23 | Discharge: 2022-05-23 | Disposition: A | Discharge status: Home / Self Care | Payer: Medicare Other | Attending: Emergency Medicine | Admitting: Emergency Medicine

## 2022-05-23 ENCOUNTER — Other Ambulatory Visit: Payer: Self-pay

## 2022-05-23 ENCOUNTER — Encounter: Payer: Self-pay | Admitting: Emergency Medicine

## 2022-05-23 DIAGNOSIS — T82838A Hemorrhage of vascular prosthetic devices, implants and grafts, initial encounter: Secondary | ICD-10-CM | POA: Diagnosis present

## 2022-05-23 DIAGNOSIS — T829XXA Unspecified complication of cardiac and vascular prosthetic device, implant and graft, initial encounter: Secondary | ICD-10-CM | POA: Insufficient documentation

## 2022-05-23 DIAGNOSIS — N186 End stage renal disease: Secondary | ICD-10-CM | POA: Diagnosis not present

## 2022-05-23 DIAGNOSIS — E876 Hypokalemia: Secondary | ICD-10-CM | POA: Insufficient documentation

## 2022-05-23 DIAGNOSIS — E1122 Type 2 diabetes mellitus with diabetic chronic kidney disease: Secondary | ICD-10-CM | POA: Insufficient documentation

## 2022-05-23 DIAGNOSIS — D631 Anemia in chronic kidney disease: Secondary | ICD-10-CM | POA: Diagnosis not present

## 2022-05-23 DIAGNOSIS — Z992 Dependence on renal dialysis: Secondary | ICD-10-CM | POA: Insufficient documentation

## 2022-05-23 DIAGNOSIS — I120 Hypertensive chronic kidney disease with stage 5 chronic kidney disease or end stage renal disease: Secondary | ICD-10-CM | POA: Diagnosis not present

## 2022-05-23 LAB — CBC WITH DIFFERENTIAL/PLATELET
Abs Immature Granulocytes: 0.04 10*3/uL (ref 0.00–0.07)
Basophils Absolute: 0 10*3/uL (ref 0.0–0.1)
Basophils Relative: 0 %
Eosinophils Absolute: 0.2 10*3/uL (ref 0.0–0.5)
Eosinophils Relative: 3 %
HCT: 28.4 % — ABNORMAL LOW (ref 36.0–46.0)
Hemoglobin: 8.7 g/dL — ABNORMAL LOW (ref 12.0–15.0)
Immature Granulocytes: 1 %
Lymphocytes Relative: 23 %
Lymphs Abs: 1.7 10*3/uL (ref 0.7–4.0)
MCH: 27.6 pg (ref 26.0–34.0)
MCHC: 30.6 g/dL (ref 30.0–36.0)
MCV: 90.2 fL (ref 80.0–100.0)
Monocytes Absolute: 0.4 10*3/uL (ref 0.1–1.0)
Monocytes Relative: 6 %
Neutro Abs: 5.1 10*3/uL (ref 1.7–7.7)
Neutrophils Relative %: 67 %
Platelets: 164 10*3/uL (ref 150–400)
RBC: 3.15 MIL/uL — ABNORMAL LOW (ref 3.87–5.11)
RDW: 17.1 % — ABNORMAL HIGH (ref 11.5–15.5)
WBC: 7.5 10*3/uL (ref 4.0–10.5)
nRBC: 0 % (ref 0.0–0.2)

## 2022-05-23 LAB — COMPREHENSIVE METABOLIC PANEL
ALT: 9 U/L (ref 0–44)
AST: 17 U/L (ref 15–41)
Albumin: 3.6 g/dL (ref 3.5–5.0)
Alkaline Phosphatase: 129 U/L — ABNORMAL HIGH (ref 38–126)
Anion gap: 16 — ABNORMAL HIGH (ref 5–15)
BUN: 12 mg/dL (ref 6–20)
CO2: 29 mmol/L (ref 22–32)
Calcium: 9.1 mg/dL (ref 8.9–10.3)
Chloride: 94 mmol/L — ABNORMAL LOW (ref 98–111)
Creatinine, Ser: 4.79 mg/dL — ABNORMAL HIGH (ref 0.44–1.00)
GFR, Estimated: 10 mL/min — ABNORMAL LOW (ref >60)
Glucose, Bld: 97 mg/dL (ref 70–99)
Potassium: 2.5 mmol/L — CL (ref 3.5–5.1)
Sodium: 139 mmol/L (ref 135–145)
Total Bilirubin: 2.2 mg/dL — ABNORMAL HIGH (ref 0.3–1.2)
Total Protein: 8 g/dL (ref 6.5–8.1)

## 2022-05-23 MED ORDER — POTASSIUM CHLORIDE CRYS ER 20 MEQ PO TBCR
30.0000 meq | EXTENDED_RELEASE_TABLET | Freq: Once | ORAL | Status: AC
  Administered 2022-05-23: 30 meq via ORAL
  Filled 2022-05-23: qty 2

## 2022-05-23 NOTE — ED Notes (Signed)
This RN did not visualize pt's dc. This RN was told by dayshift RN that family was going to get pt's chair and coming back to pick her up. When this RN went to round on pt she was not there and belongings were gone.

## 2022-05-23 NOTE — ED Triage Notes (Signed)
Arrives from [facility] dialysis [facility], c/o bleeding from LEFt AV  Fistula.  During dialysis, patient had one hour of treatment left.  States was started on Eliquis in August and since then she had one other episode of bleeding from shunt.

## 2022-05-23 NOTE — ED Provider Notes (Signed)
[facility] Provider Note    Event Date/Time   First MD Initiated Contact with Patient 05/23/22 1655     (approximate)   History   No chief complaint on file.   HPI  Cindy Osborne is a 56 y.o. female past medical history of CVA, diabetes, end-stage renal disease on dialysis Tuesday Thursday Saturday who presents with bleeding from her dialysis fistula.  Patient was at dialysis today had some bleeding around the dialysis needle.  When the needle was removed she had more profuse bleeding of the left AV fistula.  Clamp was placed at dialysis.  She otherwise feels well.  Her dialysis session today was 1 hour short.  He is on Eliquis.     Past Medical History:  Diagnosis Date   Acute ischemic right MCA stroke ([place]) 03/25/2022   Amputated toe ([place]) 01/31/2012   Right second toe distal phalanx Right great toe   Diabetes mellitus without complication ([place])    History of diabetic ulcer of foot 04/01/2014   Hyperlipidemia    Hypertension    Stroke (cerebrum) ([place]) 03/21/2022   Stroke ([place]) 01/21/2022    Patient Active Problem List   Diagnosis Date Noted   Weakness of left upper extremity 05/11/2022   History of stroke with current residual effects 05/08/2022   Mood disorder ([place]) 05/08/2022   Pressure injury of skin 04/05/2022   Hyperlipidemia 01/24/2022   Obesity 01/24/2022   Acute right arterial ischemic stroke, MCA (middle cerebral artery) ([place]) 01/21/2022   Combined forms of age-related cataract of both eyes 06/30/2021   Diabetic macular edema ([place]) 06/30/2021   Proteinuria 40/98/1191   Metabolic acidosis 47/82/9562   Encounter for insertion of mirena IUD 03/15/2021   Panic disorder 03/03/2021   Acquired absence of left leg below knee ([place]) 10/26/2020   PVD (peripheral vascular disease) ([place]) 07/28/2020   Uncontrolled type 2 diabetes mellitus with diabetic peripheral angiopathy without gangrene 02/26/2020   Acquired absence of right leg below  knee ([place]) 07/28/2019   Diabetic retinopathy ([place]) 11/23/2016   Slow transit constipation 09/24/2015   Environmental and seasonal allergies 07/09/2015   Anemia associated with chronic renal failure 07/09/2015   ESRD (end stage renal disease) on dialysis ([place]) 07/09/2015   Type II diabetes mellitus with complication ([place]) 13/03/6577   Herpes simplex infection 07/09/2015   Hyperlipidemia associated with type 2 diabetes mellitus ([place]) 07/09/2015   Heart & renal disease, hypertensive, with heart failure ([place]) 07/09/2015   Avitaminosis D 07/09/2015   Obstructive apnea 12/15/2014   Essential hypertension 09/11/2013     Physical Exam  Triage Vital Signs: ED Triage Vitals  Enc Vitals Group     BP 05/23/22 1418 114/71     Pulse Rate 05/23/22 1418 (!) 104     Resp 05/23/22 1418 16     Temp 05/23/22 1418 98.1 F (36.7 C)     Temp Source 05/23/22 1418 Oral     SpO2 05/23/22 1418 98 %     Weight 05/23/22 1413 191 lb 12.8 oz (87 kg)     Height 05/23/22 1413 5\' 9"  (1.753 m)     Head Circumference --      Peak Flow --      Pain Score 05/23/22 1413 0     Pain Loc --      Pain Edu? --      Excl. in [place]? --     Most recent vital signs: Vitals:   05/23/22 1418  BP: 114/71  Pulse: (!) 104  Resp: 16  Temp: 98.1 F (36.7 C)  SpO2: 98%     General: Awake, no distress.  CV:  Good peripheral perfusion.  Resp:  Normal effort.  Abd:  No distention.  Neuro:             Awake, Alert, Oriented x 3  Other:  Left AV fistula no with small drop of blood with very slow ooze, no arterial hemorrhage  ED Results / Procedures / Treatments  Labs (all labs ordered are listed, but only abnormal results are displayed) Labs Reviewed  CBC WITH DIFFERENTIAL/PLATELET - Abnormal; Notable for the following components:      Result Value   RBC 3.15 (*)    Hemoglobin 8.7 (*)    HCT 28.4 (*)    RDW 17.1 (*)    All other components within normal limits  COMPREHENSIVE METABOLIC PANEL - Abnormal; Notable  for the following components:   Potassium 2.5 (*)    Chloride 94 (*)    Creatinine, Ser 4.79 (*)    Alkaline Phosphatase 129 (*)    Total Bilirubin 2.2 (*)    GFR, Estimated 10 (*)    Anion gap 16 (*)    All other components within normal limits     EKG     RADIOLOGY    PROCEDURES:  Critical Care performed: No  Procedures    MEDICATIONS ORDERED IN ED: Medications  potassium chloride SA (KLOR-CON M) CR tablet 30 mEq (has no administration in time range)     IMPRESSION / MDM / ASSESSMENT AND PLAN / ED COURSE  I reviewed the triage vital signs and the nursing notes.                              Patient's presentation is most consistent with acute presentation with potential threat to life or bodily function.  Differential diagnosis includes, but is not limited to, pseudoaneurysm, coagulopathy, trauma secondary to dialysis needle  Patient is a 56 year old female with history of end-stage renal disease who presents with bleeding from her dialysis fistula.  This occurred when she had 1 hour left of her dialysis session.  Apparently there was rather brisk oozing but she denies any arterial hemorrhage.  She is hemodynamically stable just mildly tachycardic.  Labs showing hemoglobin of 8.7, 1 week ago hemoglobin was 9 so this is not far off her baseline.  Potassium is notably 2.5.  Given she has end-stage renal disease, will just give a one-time dose of 30 mg in the potassium.  I recommended that she follow-up with her nephrologist on Thursday and discuss her low potassium that they can adjust her dialysis.  I placed a pressure dressing with Surgicel over the small area of bleeding but I am overall reassured patient can be discharged.  We discussed return precautions and that if there is recurrent bleeding that she holds firm pressure.        FINAL CLINICAL IMPRESSION(S) / ED DIAGNOSES   Final diagnoses:  Hypokalemia  Complication of arteriovenous dialysis fistula,  initial encounter     Rx / DC Orders   ED Discharge Orders     None        Note:  This document was prepared using Dragon voice recognition software and may include unintentional dictation errors.   Rada Hay, MD 05/23/22 1729

## 2022-05-23 NOTE — Discharge Instructions (Signed)
Your potassium was 2.5.  We gave you a single time dose of oral potassium in the emergency department.  Please follow-up with your kidney doctors on Thursday to discuss your potassium levels.   If you have recurrent bleeding from your fistula please return to the emergency department.

## 2022-05-23 NOTE — ED Provider Triage Note (Signed)
Emergency Medicine Provider Triage Evaluation Note  Cindy Osborne , a 56 y.o. female  was evaluated in triage.  Pt complains of bleeding from left arm dialysis port per EMS.   Review of Systems  Positive:  Negative: No pain.  Physical Exam  There were no vitals taken for this visit. Gen:   Awake, no distress  talkative Resp:  Normal effort  MSK:   Left upper arm with dressing at port site.  Bleeding controlled.  Other:    Medical Decision Making  Medically screening exam initiated at 2:14 PM.  Appropriate orders placed.  [name] was informed that the remainder of the evaluation will be completed by another provider, this initial triage assessment does not replace that evaluation, and the importance of remaining in the ED until their evaluation is complete.     Johnn Hai, PA-C 05/23/22 1415

## 2022-05-24 ENCOUNTER — Telehealth: Payer: Self-pay | Admitting: Internal Medicine

## 2022-05-24 ENCOUNTER — Telehealth: Payer: Self-pay | Admitting: *Deleted

## 2022-05-24 ENCOUNTER — Ambulatory Visit: Payer: Self-pay | Admitting: *Deleted

## 2022-05-24 NOTE — Telephone Encounter (Signed)
  Chief Complaint: excessive bleeding at fistula site after dialysis yesterday- patient has concerns about getting dialysis tomorrow Symptoms: no symptoms now Frequency:   Pertinent Negatives: Patient denies   Disposition: [] ED /[] Urgent Care (no appt availability in office) / [] Appointment(In office/virtual)/ []  [place] [facility]/ [] Home Care/ [] Refused Recommended Disposition /[] [place] [facility]/ []  Follow-up with PCP Additional Notes: Patient requesting provider response- she states she does not have transportation for appointment

## 2022-05-24 NOTE — Telephone Encounter (Signed)
Called pt left VM to call back. Pt needs to call dialysis.  KP

## 2022-05-24 NOTE — Telephone Encounter (Signed)
Pt called regarding Eliquis Rx.  Pt states she was prescribed Rx in August by EDP and has recently noticed increased bleeding after dialysis.  RNCM advised pt to contact her PCP regarding this matter as they follow her care closely and are able to make changes promptly.

## 2022-05-24 NOTE — Telephone Encounter (Signed)
Called spoke with patient, she states that she is nervous about going to dialysis due to excessive bleeding she recently experienced. She wanted to get her PCP recommendations on  excessive bleeding from fistula site and if it is ok to go through with dialysis after losing blood.  Nurse Triage routed previous message with same concerns. Please advise patient of Dr. Ron Agee recommendations, not dialysis.

## 2022-05-24 NOTE — Telephone Encounter (Signed)
Called pt informed her of Dr. Gaspar Cola note. Pt verbalized understanding.  KP

## 2022-05-24 NOTE — Telephone Encounter (Signed)
Spoke to pt she wants to know if she should stop eliquis. Pt stated that is what is making her bleed. Viewed pts chart the ED sent in a 30 day supply on 03/25/22. Pt should be done with the medication but she still has some left. Pt stated she is taking it as prescribed.  KP

## 2022-05-24 NOTE — Telephone Encounter (Signed)
[facility] called to report a missed / cancelled PT visit for today / this pt is waiting on a left lower pro[facility]sis [facility] / the position of her foot doesn't look good and is out of alignment / it is a fall risk / PT is pending [facility]

## 2022-05-24 NOTE — Telephone Encounter (Signed)
Summary: bleeding due to eliquis   You went to [facility] on 10.7.23 / they prescribed er eliquis and now at dialysis she bleeds a lot and had to be taken to the ER for that / pt is nervous to go to Dialysis on Thursday and Saturday due to bleeding / pt wants to know what Dr. [name] wants her to do / please advise      Reason for Disposition  Nursing judgment or information in reference  Answer Assessment - Initial Assessment Questions 1. REASON FOR CALL: "What is your main concern right now?"     Patient has concerns about excessive bleeding 2. ONSET: "When did the bleeding start?"     Had excessive bleeding yesterday- so bad they called 911 3. SEVERITY: "How bad is the bleeding?"     Patient reports they were able to get the bleeding stopped- but patient has concerns about going to dialysis tomorrow Patient states she has stroke 03/20/22 and has been on Eliquis since. Patient states she has excessive bleeding from graft site after dialysis yesterday- it was so bad that 911 was called. Patient reports they were able to get the bleeding to stop. Patient is unable to come to the office for appointment- due to transportation- but wants to know what Dr Carolin Coy wants her to do.  Protocols used: No Guideline Available-A-AH

## 2022-05-24 NOTE — Telephone Encounter (Signed)
Please review.  KP

## 2022-05-24 NOTE — Telephone Encounter (Signed)
FYI

## 2022-05-25 ENCOUNTER — Other Ambulatory Visit: Payer: Self-pay

## 2022-05-25 ENCOUNTER — Inpatient Hospital Stay: Payer: Federal, State, Local not specified - PPO | Admitting: Diagnostic Neuroimaging

## 2022-05-25 MED ORDER — PRAVASTATIN SODIUM 40 MG PO TABS: 40.0000 mg | ORAL_TABLET | Freq: Every day | ORAL | 0 refills | Status: DC

## 2022-05-27 ENCOUNTER — Encounter (HOSPITAL_COMMUNITY): Payer: Self-pay

## 2022-05-27 ENCOUNTER — Observation Stay (HOSPITAL_COMMUNITY)
Admission: EM | Admit: 2022-05-27 | Discharge: 2022-05-28 | Disposition: A | Discharge status: Home Health | Payer: Medicare Other | Attending: Internal Medicine | Admitting: Internal Medicine

## 2022-05-27 ENCOUNTER — Other Ambulatory Visit: Payer: Self-pay

## 2022-05-27 ENCOUNTER — Observation Stay (HOSPITAL_COMMUNITY): Payer: Medicare Other

## 2022-05-27 DIAGNOSIS — Z992 Dependence on renal dialysis: Secondary | ICD-10-CM | POA: Insufficient documentation

## 2022-05-27 DIAGNOSIS — Z6831 Body mass index (BMI) 31.0-31.9, adult: Secondary | ICD-10-CM | POA: Insufficient documentation

## 2022-05-27 DIAGNOSIS — Z7901 Long term (current) use of anticoagulants: Secondary | ICD-10-CM | POA: Insufficient documentation

## 2022-05-27 DIAGNOSIS — Z91010 Allergy to peanuts: Secondary | ICD-10-CM | POA: Diagnosis not present

## 2022-05-27 DIAGNOSIS — R9431 Abnormal electrocardiogram [ECG] [EKG]: Secondary | ICD-10-CM | POA: Insufficient documentation

## 2022-05-27 DIAGNOSIS — I120 Hypertensive chronic kidney disease with stage 5 chronic kidney disease or end stage renal disease: Secondary | ICD-10-CM | POA: Insufficient documentation

## 2022-05-27 DIAGNOSIS — Z89411 Acquired absence of right great toe: Secondary | ICD-10-CM | POA: Diagnosis not present

## 2022-05-27 DIAGNOSIS — E876 Hypokalemia: Secondary | ICD-10-CM | POA: Diagnosis not present

## 2022-05-27 DIAGNOSIS — Z89512 Acquired absence of left leg below knee: Secondary | ICD-10-CM | POA: Insufficient documentation

## 2022-05-27 DIAGNOSIS — Z7984 Long term (current) use of oral hypoglycemic drugs: Secondary | ICD-10-CM | POA: Insufficient documentation

## 2022-05-27 DIAGNOSIS — D631 Anemia in chronic kidney disease: Secondary | ICD-10-CM | POA: Diagnosis not present

## 2022-05-27 DIAGNOSIS — Z89511 Acquired absence of right leg below knee: Secondary | ICD-10-CM | POA: Insufficient documentation

## 2022-05-27 DIAGNOSIS — Z8673 Personal history of transient ischemic attack (TIA), and cerebral infarction without residual deficits: Secondary | ICD-10-CM | POA: Insufficient documentation

## 2022-05-27 DIAGNOSIS — Z79899 Other long term (current) drug therapy: Secondary | ICD-10-CM | POA: Insufficient documentation

## 2022-05-27 DIAGNOSIS — D62 Acute posthemorrhagic anemia: Secondary | ICD-10-CM | POA: Diagnosis not present

## 2022-05-27 DIAGNOSIS — E669 Obesity, unspecified: Secondary | ICD-10-CM | POA: Insufficient documentation

## 2022-05-27 DIAGNOSIS — D649 Anemia, unspecified: Principal | ICD-10-CM

## 2022-05-27 DIAGNOSIS — R0600 Dyspnea, unspecified: Secondary | ICD-10-CM | POA: Diagnosis not present

## 2022-05-27 DIAGNOSIS — K5909 Other constipation: Secondary | ICD-10-CM | POA: Diagnosis not present

## 2022-05-27 DIAGNOSIS — N186 End stage renal disease: Secondary | ICD-10-CM | POA: Insufficient documentation

## 2022-05-27 DIAGNOSIS — R5381 Other malaise: Secondary | ICD-10-CM | POA: Insufficient documentation

## 2022-05-27 DIAGNOSIS — R112 Nausea with vomiting, unspecified: Principal | ICD-10-CM | POA: Insufficient documentation

## 2022-05-27 DIAGNOSIS — E1122 Type 2 diabetes mellitus with diabetic chronic kidney disease: Secondary | ICD-10-CM | POA: Diagnosis not present

## 2022-05-27 DIAGNOSIS — Z89421 Acquired absence of other right toe(s): Secondary | ICD-10-CM | POA: Diagnosis not present

## 2022-05-27 DIAGNOSIS — I480 Paroxysmal atrial fibrillation: Secondary | ICD-10-CM | POA: Insufficient documentation

## 2022-05-27 LAB — COMPREHENSIVE METABOLIC PANEL
ALT: 16 U/L (ref 0–44)
AST: 25 U/L (ref 15–41)
Albumin: 3.3 g/dL — ABNORMAL LOW (ref 3.5–5.0)
Alkaline Phosphatase: 114 U/L (ref 38–126)
Anion gap: 19 — ABNORMAL HIGH (ref 5–15)
BUN: 6 mg/dL (ref 6–20)
CO2: 25 mmol/L (ref 22–32)
Calcium: 8.7 mg/dL — ABNORMAL LOW (ref 8.9–10.3)
Chloride: 95 mmol/L — ABNORMAL LOW (ref 98–111)
Creatinine, Ser: 3.75 mg/dL — ABNORMAL HIGH (ref 0.44–1.00)
GFR, Estimated: 13 mL/min — ABNORMAL LOW (ref >60)
Glucose, Bld: 108 mg/dL — ABNORMAL HIGH (ref 70–99)
Potassium: 2.6 mmol/L — CL (ref 3.5–5.1)
Sodium: 139 mmol/L (ref 135–145)
Total Bilirubin: 2.4 mg/dL — ABNORMAL HIGH (ref 0.3–1.2)
Total Protein: 7.7 g/dL (ref 6.5–8.1)

## 2022-05-27 LAB — I-STAT BETA HCG BLOOD, ED (MC, WL, AP ONLY): I-stat hCG, quantitative: 12.2 m[IU]/mL — ABNORMAL HIGH (ref <5)

## 2022-05-27 LAB — CBC
HCT: 25.2 % — ABNORMAL LOW (ref 36.0–46.0)
Hemoglobin: 7.6 g/dL — ABNORMAL LOW (ref 12.0–15.0)
MCH: 28.6 pg (ref 26.0–34.0)
MCHC: 30.2 g/dL (ref 30.0–36.0)
MCV: 94.7 fL (ref 80.0–100.0)
Platelets: 163 10*3/uL (ref 150–400)
RBC: 2.66 MIL/uL — ABNORMAL LOW (ref 3.87–5.11)
RDW: 17.5 % — ABNORMAL HIGH (ref 11.5–15.5)
WBC: 8.5 10*3/uL (ref 4.0–10.5)
nRBC: 0 % (ref 0.0–0.2)

## 2022-05-27 LAB — I-STAT CHEM 8, ED
BUN: 6 mg/dL (ref 6–20)
Calcium, Ion: 0.9 mmol/L — ABNORMAL LOW (ref 1.15–1.40)
Chloride: 94 mmol/L — ABNORMAL LOW (ref 98–111)
Creatinine, Ser: 3.7 mg/dL — ABNORMAL HIGH (ref 0.44–1.00)
Glucose, Bld: 111 mg/dL — ABNORMAL HIGH (ref 70–99)
HCT: 25 % — ABNORMAL LOW (ref 36.0–46.0)
Hemoglobin: 8.5 g/dL — ABNORMAL LOW (ref 12.0–15.0)
Potassium: 2.7 mmol/L — CL (ref 3.5–5.1)
Sodium: 137 mmol/L (ref 135–145)
TCO2: 28 mmol/L (ref 22–32)

## 2022-05-27 LAB — ABO/RH: ABO/RH(D): O POS

## 2022-05-27 LAB — LIPASE, BLOOD: Lipase: 26 U/L (ref 11–51)

## 2022-05-27 LAB — POC OCCULT BLOOD, ED: Fecal Occult Bld: NEGATIVE

## 2022-05-27 LAB — PREPARE RBC (CROSSMATCH)

## 2022-05-27 MED ORDER — MELATONIN 5 MG PO TABS: 5.0000 mg | ORAL_TABLET | Freq: Every evening | ORAL | Status: DC | PRN

## 2022-05-27 MED ORDER — AMITRIPTYLINE HCL 25 MG PO TABS
25.0000 mg | ORAL_TABLET | Freq: Every day | ORAL | Status: DC
  Administered 2022-05-27: 25 mg via ORAL
  Filled 2022-05-27: qty 1

## 2022-05-27 MED ORDER — POLYETHYLENE GLYCOL 3350 17 G PO PACK: 17.0000 g | PACK | Freq: Every day | ORAL | Status: DC | PRN

## 2022-05-27 MED ORDER — SODIUM CHLORIDE 0.9 % IV SOLN
10.0000 mL/h | Freq: Once | INTRAVENOUS | Status: AC
  Administered 2022-05-27: 10 mL/h via INTRAVENOUS

## 2022-05-27 MED ORDER — PROMETHAZINE HCL 25 MG RE SUPP: 25.0000 mg | Freq: Four times a day (QID) | RECTAL | Status: DC | PRN

## 2022-05-27 MED ORDER — PROCHLORPERAZINE EDISYLATE 10 MG/2ML IJ SOLN
5.0000 mg | Freq: Four times a day (QID) | INTRAMUSCULAR | Status: DC | PRN
  Administered 2022-05-28: 5 mg via INTRAVENOUS
  Filled 2022-05-27: qty 2

## 2022-05-27 MED ORDER — PROCHLORPERAZINE EDISYLATE 10 MG/2ML IJ SOLN: 5.0000 mg | Freq: Four times a day (QID) | INTRAMUSCULAR | Status: DC | PRN

## 2022-05-27 MED ORDER — ACETAMINOPHEN 325 MG PO TABS: 650.0000 mg | ORAL_TABLET | Freq: Four times a day (QID) | ORAL | Status: DC | PRN

## 2022-05-27 MED ORDER — SODIUM CHLORIDE 0.9 % IV SOLN
12.5000 mg | Freq: Once | INTRAVENOUS | Status: DC
  Filled 2022-05-27: qty 0.5

## 2022-05-27 MED ORDER — POTASSIUM CHLORIDE CRYS ER 20 MEQ PO TBCR
40.0000 meq | EXTENDED_RELEASE_TABLET | Freq: Once | ORAL | Status: AC
  Administered 2022-05-27: 40 meq via ORAL
  Filled 2022-05-27: qty 2

## 2022-05-27 MED ORDER — SODIUM CHLORIDE 0.9 % IV BOLUS
500.0000 mL | Freq: Once | INTRAVENOUS | Status: AC
  Administered 2022-05-27: 500 mL via INTRAVENOUS

## 2022-05-27 MED ORDER — SODIUM CHLORIDE 0.9 % IV SOLN
12.5000 mg | Freq: Four times a day (QID) | INTRAVENOUS | Status: DC | PRN
  Administered 2022-05-27: 12.5 mg via INTRAVENOUS

## 2022-05-27 MED ORDER — APIXABAN 5 MG PO TABS
5.0000 mg | ORAL_TABLET | Freq: Two times a day (BID) | ORAL | Status: DC
  Administered 2022-05-27 – 2022-05-28 (×2): 5 mg via ORAL
  Filled 2022-05-27 (×2): qty 1

## 2022-05-27 NOTE — H&P (Addendum)
History and Physical  Cindy Osborne:[id number] DOB: 1965/10/30 DOA: 05/27/2022  Referring physician: Rebeca Allegra  PCP: Glean Hess, MD  Outpatient Specialists: Nephrology Patient coming from: Home  Chief Complaint: Nausea and vomiting   HPI: Cindy Osborne is a 56 y.o. female with medical history significant for ESRD on HD TTS, peripheral vascular disease status post bilateral BKA, prior CVA x 2, chronic anxiety/depression, hypertension, type 2 diabetes, hyperlipidemia, obesity, pulmonary nodules, paroxysmal A-fib on Eliquis, who presented to [facility] ED from hemodialysis center due to intractable nausea and vomiting for the past 5 days. Unable to keep any food down.  Emesis is nonbilious nonbloody.  Denies abdominal pain.  No use of NSAIDs.  The patient is on daily aspirin as her routine CVA antiplatelet regimen.  No reported subjective fevers or chills.  Endorses shortened sessions of dialysis due to intermittent bleeding from her fistula when accessed at hemodialysis.  She presented to the ED for further evaluation.  Work-up in the ED revealed tachycardia and anemia of acute blood loss and of chronic disease with hemoglobin of 7.6 from baseline of 11.  EDP requested admission due to intractable nausea vomiting and anemia.  The patient was admitted by [facility], hospitalist service.  ED Course: Tmax 98.5.  BP 102/76, pulse 98, respiration rate 24, O2 saturation 96% on room air.  Lab studies remarkable for serum potassium 2.7, glucose 111, T. bili Ruben 2.4.  Hemoglobin 7.6 from baseline of 11 almost 2 months ago.  Review of Systems: Review of systems as noted in the HPI. All other systems reviewed and are negative.   Past Medical History:  Diagnosis Date   Acute ischemic right MCA stroke (Cindy Osborne) 03/25/2022   Amputated toe (Cindy Osborne) 01/31/2012   Right second toe distal phalanx Right Cindy toe   Diabetes mellitus without complication (Cindy Osborne)    History of diabetic ulcer of foot 04/01/2014    Hyperlipidemia    Hypertension    Stroke (cerebrum) (Cindy Osborne) 03/21/2022   Stroke (Cindy Osborne) 01/21/2022   Past Surgical History:  Procedure Laterality Date   angioplasty politeal Left 07/2020   avg for dialysis     CHOLECYSTECTOMY     COLONOSCOPY  08/15/2011   cleared for 10 yrs   IR CT HEAD LTD  01/21/2022   IR CT HEAD LTD  03/21/2022   IR PERCUTANEOUS ART THROMBECTOMY/INFUSION INTRACRANIAL INC DIAG ANGIO  01/21/2022   IR PERCUTANEOUS ART THROMBECTOMY/INFUSION INTRACRANIAL INC DIAG ANGIO  03/21/2022   IR US GUIDE VASC ACCESS RIGHT  01/21/2022   IR US GUIDE VASC ACCESS RIGHT  03/21/2022   LEG AMPUTATION BELOW KNEE Right 04/2019   LEG AMPUTATION BELOW KNEE Left 09/08/2020   RADIOLOGY WITH ANESTHESIA N/A 01/21/2022   Procedure: IR WITH ANESTHESIA;  Surgeon: Radiologist, Medication, MD;  Location: [place];  Service: Radiology;  Laterality: N/A;   RADIOLOGY WITH ANESTHESIA N/A 03/21/2022   Procedure: IR WITH ANESTHESIA;  Surgeon: Radiologist, Medication, MD;  Location: [place];  Service: Radiology;  Laterality: N/A;   TOE AMPUTATION Right 10/15/2018    Social History:  reports that she has never smoked. She has never used smokeless tobacco. She reports current alcohol use. She reports that she does not use drugs.   Allergies  Allergen Reactions   Augmentin [Amoxicillin-Pot Clavulanate] Shortness Of Breath   Atorvastatin Other (See Comments)    Back pain   Pioglitazone Other (See Comments)    Edema   Peanut-Containing Drug Products Itching   Rosuvastatin Itching and Other (See Comments)  Back pain   Shellfish-Derived Products Itching and Rash    shrimp   Sulfa Antibiotics Rash and Other (See Comments)    Shedding of skin    Family History  Problem Relation Age of Onset   Diabetes Mother    Diabetes Sister       Prior to Admission medications   Medication Sig Start Date End Date Taking? Authorizing Provider  acetaminophen (TYLENOL) 325 MG tablet Take 1-2 tablets (325-650 mg total) by mouth  every 4 (four) hours as needed for mild pain. 03/27/22   Love, Ivan Anchors, PA-C  amitriptyline (ELAVIL) 25 MG tablet Take 1 tablet (25 mg total) by mouth at bedtime. 05/12/22   Glean Hess, MD  apixaban (ELIQUIS) 5 MG TABS tablet Take 1 tablet (5 mg total) by mouth 2 (two) times daily. 03/25/22   Bailey-Modzik, Mayo Ao A, NP  cinacalcet (SENSIPAR) 30 MG tablet Take 3 tablets (90 mg total) by mouth Every Tuesday,Thursday,and Saturday with dialysis. 03/25/22   Bailey-Modzik, Delila A, NP  linagliptin (TRADJENTA) 5 MG TABS tablet Take 1 tablet (5 mg total) by mouth daily. 04/11/22   Love, Ivan Anchors, PA-C  liver oil-zinc oxide (DESITIN) 40 % ointment Apply topically 2 (two) times daily. To buttocks 04/10/22   Love, Ivan Anchors, PA-C  metoprolol (TOPROL-XL) 200 MG 24 hr tablet Take 1 tablet (200 mg total) by mouth daily. Take with or immediately following a meal. 01/25/22   August Albino, NP  pravastatin (PRAVACHOL) 40 MG tablet Take 1 tablet (40 mg total) by mouth daily at 6 PM. 06/02/22   Glean Hess, MD    Physical Exam: BP 125/76   Pulse (!) 101   Temp (!) 97.1 F (36.2 C)   Resp 18   Ht 5\' 9"  (1.753 m)   Wt 96.6 kg   SpO2 97%   BMI 31.45 kg/m   General: 56 y.o. year-old female well developed well nourished in no acute distress.  Alert and oriented x3. Cardiovascular: Tachycardic with no rubs or gallops.  No thyromegaly or JVD noted.  No lower extremity edema. 2/4 pulses in all 4 extremities. Respiratory: Mild rales at bases.  No wheezing noted.  Poor inspiratory effort.   Abdomen: Soft nontender nondistended with normal bowel sounds x4 quadrants. Muskuloskeletal: No cyanosis, clubbing or edema noted bilaterally. B/L BKA. Neuro: CN II-XII intact, strength, sensation, reflexes Skin: No ulcerative lesions noted or rashes Psychiatry: Judgement and insight appear normal. Mood is appropriate for condition and setting          Labs on Admission:  Basic Metabolic Panel: Recent Labs  Lab  05/23/22 1422 05/27/22 1649 05/27/22 1657  NA 139 139 137  K 2.5* 2.6* 2.7*  CL 94* 95* 94*  CO2 29 25  --   GLUCOSE 97 108* 111*  BUN 12 6 6   CREATININE 4.79* 3.75* 3.70*  CALCIUM 9.1 8.7*  --    Liver Function Tests: Recent Labs  Lab 05/23/22 1422 05/27/22 1649  AST 17 25  ALT 9 16  ALKPHOS 129* 114  BILITOT 2.2* 2.4*  PROT 8.0 7.7  ALBUMIN 3.6 3.3*   Recent Labs  Lab 05/27/22 1649  LIPASE 26   No results for input(s): "AMMONIA" in the last 168 hours. CBC: Recent Labs  Lab 05/23/22 1422 05/27/22 1649 05/27/22 1657  WBC 7.5 8.5  --   NEUTROABS 5.1  --   --   HGB 8.7* 7.6* 8.5*  HCT 28.4* 25.2* 25.0*  MCV 90.2 94.7  --  PLT 164 163  --    Cardiac Enzymes: No results for input(s): "CKTOTAL", "CKMB", "CKMBINDEX", "TROPONINI" in the last 168 hours.  BNP (last 3 results) No results for input(s): "BNP" in the last 8760 hours.  ProBNP (last 3 results) No results for input(s): "PROBNP" in the last 8760 hours.  CBG: No results for input(s): "GLUCAP" in the last 168 hours.  Radiological Exams on Admission: No results found.  EKG: I independently viewed the EKG done and my findings are as followed: Sinus tachycardia, rate of 103.  Nonspecific ST-T changes.  QTc 526.  Assessment/Plan Present on Admission:  Intractable nausea and vomiting  Principal Problem:   Intractable nausea and vomiting  Intractable nausea vomiting, unclear etiology, possible viral gastroenteritis. Supportive care with IV antiemetics and electrolytes replacement. Encourage oral intake-Unable to tolerate food for the past 5 days due to nausea and vomiting. Start clear liquid diet and advance diet as tolerated.  Hypokalemia Presented with serum potassium of 2.6 Repleted orally in the ED KCl 40 mEq x 1 Repeat renal function test and magnesium level in the morning. Replete electrolytes judiciously due to ESRD until next HD.  Anemia of acute blood loss and of chronic disease in the  setting of ESRD. Baseline hemoglobin 11 Hemoglobin downtrending 7.6 1U PRBCs ordered to be transfused in the ED. Negative FOBT Bleeding from fistula when accessed Monitor H&H.  Paroxysmal A-fib on Eliquis History of CVA Resume home Eliquis for secondary CVA prevention Unclear if she is still on baby aspirin 81 mg daily. BPs are currently soft, restart Toprol-XL once blood pressure allows. Continue to closely monitor vital signs.  Prolonged QTc QTc on admission twelve-lead EKG greater than 520. Avoid QTc prolonging agents Optimize magnesium level greater than 2.0 Optimize potassium level greater than 4.0. Repeat serum potassium level tonight  Generalized fatigue suspect from poor oral intake Rule out hypoglycemia, CBG nightly  Conversational dyspnea Obtain chest x-ray Check BNP Closely monitor and maintain O2 saturation above 90%  Sinus tachycardia Monitor on telemetry Obtain TSH  Constipation, chronic Bowel regimen in place.  ESRD on HD TTS HD today Consult nephrology in the AM to continue HD while inpatient.  Peripheral vascular disease status post bilateral BKA Resume home regimen. Fall precautions.   Critical care time: 65 minutes    DVT prophylaxis: On home Eliquis.  Code Status: Full code  Family Communication: Updated her mother and sister at bedside.  Disposition Plan: Admitted to telemetry medical unit  Consults called: None  Admission status: Inpatient status.   Status is: Inpatient. The patient will require least 2 midnights for further evaluation and treatment.  Condition.   Kayleen Memos MD [facility] Pager [phone]  If 7PM-7AM, please contact night-coverage [email or web address] Password [email or web address]  05/27/2022, 11:06 PM

## 2022-05-27 NOTE — ED Provider Notes (Signed)
[facility] EMERGENCY DEPARTMENT Provider Note   CSN: [id number] Arrival date & time: 05/27/22  1614     History  Chief Complaint  Patient presents with   Emesis   Weakness    Dhwani Venkatesh is a 56 y.o. female.  56 year old female brought in by caretakers from dialysis. Patient reports vomiting for the past 2 days, bilious emesis, non bloody, no abdominal pain.  Patient attends dialysis Tuesday, Thursday, Saturday.  Was 1 hour short on dialysis on Thursday due to bleeding from her site, on Eliquis.  Return to dialysis today and was 30 minutes short again due to bleeding.  States that they are not taking any fluid off of her because she has not been drinking well.       Home Medications Prior to Admission medications   Medication Sig Start Date End Date Taking? Authorizing Provider  acetaminophen (TYLENOL) 325 MG tablet Take 1-2 tablets (325-650 mg total) by mouth every 4 (four) hours as needed for mild pain. 03/27/22   Love, Ivan Anchors, PA-C  amitriptyline (ELAVIL) 25 MG tablet Take 1 tablet (25 mg total) by mouth at bedtime. 05/12/22   Glean Hess, MD  apixaban (ELIQUIS) 5 MG TABS tablet Take 1 tablet (5 mg total) by mouth 2 (two) times daily. 03/25/22   Bailey-Modzik, Mayo Ao A, NP  cinacalcet (SENSIPAR) 30 MG tablet Take 3 tablets (90 mg total) by mouth Every Tuesday,Thursday,and Saturday with dialysis. 03/25/22   Bailey-Modzik, Delila A, NP  linagliptin (TRADJENTA) 5 MG TABS tablet Take 1 tablet (5 mg total) by mouth daily. 04/11/22   Love, Ivan Anchors, PA-C  liver oil-zinc oxide (DESITIN) 40 % ointment Apply topically 2 (two) times daily. To buttocks 04/10/22   Love, Ivan Anchors, PA-C  metoprolol (TOPROL-XL) 200 MG 24 hr tablet Take 1 tablet (200 mg total) by mouth daily. Take with or immediately following a meal. 01/25/22   August Albino, NP  pravastatin (PRAVACHOL) 40 MG tablet Take 1 tablet (40 mg total) by mouth daily at 6 PM. 06/02/22   Glean Hess, MD       Allergies    Augmentin [amoxicillin-pot clavulanate], Atorvastatin, Pioglitazone, Peanut-containing drug products, Rosuvastatin, Shellfish-derived products, and Sulfa antibiotics    Review of Systems   Review of Systems Negative except as per HPI Physical Exam Updated Vital Signs BP 125/76   Pulse (!) 101   Temp (!) 97.1 F (36.2 C)   Resp 18   Ht 5\' 9"  (1.753 m)   Wt 96.6 kg   SpO2 97%   BMI 31.45 kg/m  Physical Exam Vitals and nursing note reviewed. Exam conducted with a chaperone present.  Constitutional:      General: She is not in acute distress.    Appearance: She is well-developed. She is not diaphoretic.  HENT:     Head: Normocephalic and atraumatic.     Mouth/Throat:     Mouth: Mucous membranes are dry.  Eyes:     Conjunctiva/sclera: Conjunctivae normal.  Cardiovascular:     Rate and Rhythm: Regular rhythm. Tachycardia present.     Heart sounds: Normal heart sounds.  Pulmonary:     Effort: Pulmonary effort is normal.     Breath sounds: Normal breath sounds.  Abdominal:     Palpations: Abdomen is soft.     Tenderness: There is no abdominal tenderness.  Genitourinary:    Rectum: Guaiac result negative. No tenderness.     Comments: Stool soft and brown Musculoskeletal:  Comments: Bilateral BKA  Skin:    General: Skin is warm and dry.     Findings: No erythema or rash.  Neurological:     Mental Status: She is alert and oriented to person, place, and time.  Psychiatric:        Behavior: Behavior normal.     ED Results / Procedures / Treatments   Labs (all labs ordered are listed, but only abnormal results are displayed) Labs Reviewed  COMPREHENSIVE METABOLIC PANEL - Abnormal; Notable for the following components:      Result Value   Potassium 2.6 (*)    Chloride 95 (*)    Glucose, Bld 108 (*)    Creatinine, Ser 3.75 (*)    Calcium 8.7 (*)    Albumin 3.3 (*)    Total Bilirubin 2.4 (*)    GFR, Estimated 13 (*)    Anion gap 19 (*)    All  other components within normal limits  CBC - Abnormal; Notable for the following components:   RBC 2.66 (*)    Hemoglobin 7.6 (*)    HCT 25.2 (*)    RDW 17.5 (*)    All other components within normal limits  I-STAT BETA HCG BLOOD, ED (MC, WL, AP ONLY) - Abnormal; Notable for the following components:   I-stat hCG, quantitative 12.2 (*)    All other components within normal limits  I-STAT CHEM 8, ED - Abnormal; Notable for the following components:   Potassium 2.7 (*)    Chloride 94 (*)    Creatinine, Ser 3.70 (*)    Glucose, Bld 111 (*)    Calcium, Ion 0.90 (*)    Hemoglobin 8.5 (*)    HCT 25.0 (*)    All other components within normal limits  LIPASE, BLOOD  URINALYSIS, ROUTINE W REFLEX MICROSCOPIC  POTASSIUM  MAGNESIUM  BRAIN NATRIURETIC PEPTIDE  POC OCCULT BLOOD, ED  TYPE AND SCREEN  ABO/RH  PREPARE RBC (CROSSMATCH)    EKG EKG Interpretation  Date/Time:  Saturday May 27 2022 16:21:16 EDT Ventricular Rate:  103 PR Interval:  180 QRS Duration: 138 QT Interval:  402 QTC Calculation: 526 R Axis:   -20 Text Interpretation: Sinus tachycardia with Premature atrial complexes with Abberant conduction Non-specific intra-ventricular conduction block Minimal voltage criteria for LVH, may be normal variant ( Cornell product ) diffuse ST/T changes in similar distribution to Aug 2023 Confirmed by Sherwood Gambler [phone]) on 05/27/2022 4:56:57 PM  Radiology No results found.  Procedures .Critical Care  Performed by: Tacy Learn, PA-C Authorized by: Tacy Learn, PA-C   Critical care provider statement:    Critical care time (minutes):  30   Critical care was time spent personally by me on the following activities:  Development of treatment plan with patient or surrogate, discussions with consultants, evaluation of patient's response to treatment, examination of patient, ordering and review of laboratory studies, ordering and review of radiographic studies, ordering and  performing treatments and interventions, pulse oximetry, re-evaluation of patient's condition and review of old charts     Medications Ordered in ED Medications  0.9 %  sodium chloride infusion (has no administration in time range)  prochlorperazine (COMPAZINE) injection 5 mg (has no administration in time range)  melatonin tablet 5 mg (has no administration in time range)  acetaminophen (TYLENOL) tablet 650 mg (has no administration in time range)  polyethylene glycol (MIRALAX / GLYCOLAX) packet 17 g (has no administration in time range)  amitriptyline (ELAVIL) tablet 25 mg (  has no administration in time range)  apixaban (ELIQUIS) tablet 5 mg (has no administration in time range)  potassium chloride SA (KLOR-CON M) CR tablet 40 mEq (40 mEq Oral Given 05/27/22 1902)  sodium chloride 0.9 % bolus 500 mL (0 mLs Intravenous Stopped 05/27/22 1953)    ED Course/ Medical Decision Making/ A&P                           Medical Decision Making Amount and/or Complexity of Data Reviewed Labs: ordered.  Risk Prescription drug management.   This patient presents to the ED for concern of vomiting, short of breath with exertion, weakness, this involves an extensive number of treatment options, and is a complaint that carries with it a high risk of complications and morbidity.  The differential diagnosis includes but not limited to electrolyte disturbance, anemia, dehydration   Co morbidities that complicate the patient evaluation  ESRD on dialysis, diabetes, bilateral BKA, hyperlipidemia, CVA on Eliquis   Additional history obtained:  Additional history obtained from caregivers at bedside who assist with history as above External records from outside source obtained and reviewed including recent visit to [facility] for bleeding from her dialysis fistula dated 05/23/2022 patient was mildly tachycardic at that visit, hemoglobin of 8.7, previously 9 with a baseline of 11,  notably hypokalemic with potassium of 2.5.  Given oral potassium at that visit.   Lab Tests:  I Ordered, and personally interpreted labs.  The pertinent results include: Hemoccult negative, CMP with hypokalemia, potassium 2.6, creatinine 3.75, had dialysis today.  CBC with hemoglobin 7.6, normal WBC, lipase normal. Hcg elevated at 12.2- baseline- not pregnant   Cardiac Monitoring: / EKG:  The patient was maintained on a cardiac monitor.  I personally viewed and interpreted the cardiac monitored which showed an underlying rhythm of: Sinus tachycardia, rate 103   Consultations Obtained:  I requested consultation with the hospitalist, Dr. Nevada Crane,  and discussed lab and imaging findings as well as pertinent plan - they recommend: Will consult for admission Case discussed with Dr. Sabra Heck, ER attending.  Recommends transfuse 1 unit, admit for further management, agreeable with potassium replacement.   Problem List / ED Course / Critical interventions / Medication management  56 year old female presents with concern for bilious emesis x 2 days.  Multiple medical problems, ESRD on dialysis, difficulty with her last 2 sessions due to bleeding from her site.  Also bilateral BKA.  Patient arrives quite tachycardic, suspect this is secondary to her anemia.  She is baseline hemoglobin of 11, found to be 7.6 today.  Hemoccult negative.  Patient was given oral potassium, small IV fluid bolus as well as 1 unit packed red blood cells.  Discussed with hospitalist will consult for admission. I ordered medication including KDur for hypokalemia, 1U PRBC for symptomatic anemia, phenergan for nausea  Reevaluation of the patient after these medicines showed that the patient stayed the same I have reviewed the patients home medicines and have made adjustments as needed   Social Determinants of Health:  Lives with caretakers, attends dialysis   Test / Admission - Considered:  Admit for further  management         Final Clinical Impression(s) / ED Diagnoses Final diagnoses:  Anemia, unspecified type  Hypokalemia  ESRD (end stage renal disease) on dialysis [facility]    Rx / DC Orders ED Discharge Orders     None  Tacy Learn, PA-C 05/27/22 2300    Noemi Chapel, MD 05/30/22 [phone]

## 2022-05-27 NOTE — ED Notes (Signed)
The pt is dialysis pt and only voids a little once a day

## 2022-05-27 NOTE — ED Triage Notes (Addendum)
Pt reports she has been vomiting up bile today associated with a lack of appetite x 5 days. She finished dialysis today (all but 30 mins of it) and states she wants her fistula (left arm) assessed because it bleeds when its accessed. She is on Eliquis. She denies pain but states she feels anxious and very weak. Bilateral AKA amputations.

## 2022-05-28 ENCOUNTER — Encounter (HOSPITAL_COMMUNITY): Payer: Self-pay | Admitting: Internal Medicine

## 2022-05-28 DIAGNOSIS — D649 Anemia, unspecified: Secondary | ICD-10-CM

## 2022-05-28 DIAGNOSIS — R112 Nausea with vomiting, unspecified: Secondary | ICD-10-CM | POA: Diagnosis not present

## 2022-05-28 DIAGNOSIS — N186 End stage renal disease: Secondary | ICD-10-CM

## 2022-05-28 DIAGNOSIS — E876 Hypokalemia: Secondary | ICD-10-CM

## 2022-05-28 DIAGNOSIS — Z992 Dependence on renal dialysis: Secondary | ICD-10-CM

## 2022-05-28 LAB — TYPE AND SCREEN
ABO/RH(D): O POS
Antibody Screen: NEGATIVE
Unit division: 0

## 2022-05-28 LAB — COMPREHENSIVE METABOLIC PANEL
ALT: 21 U/L (ref 0–44)
AST: 30 U/L (ref 15–41)
Albumin: 3.1 g/dL — ABNORMAL LOW (ref 3.5–5.0)
Alkaline Phosphatase: 107 U/L (ref 38–126)
Anion gap: 17 — ABNORMAL HIGH (ref 5–15)
BUN: 9 mg/dL (ref 6–20)
CO2: 27 mmol/L (ref 22–32)
Calcium: 8.4 mg/dL — ABNORMAL LOW (ref 8.9–10.3)
Chloride: 94 mmol/L — ABNORMAL LOW (ref 98–111)
Creatinine, Ser: 4.56 mg/dL — ABNORMAL HIGH (ref 0.44–1.00)
GFR, Estimated: 11 mL/min — ABNORMAL LOW (ref >60)
Glucose, Bld: 123 mg/dL — ABNORMAL HIGH (ref 70–99)
Potassium: 3 mmol/L — ABNORMAL LOW (ref 3.5–5.1)
Sodium: 138 mmol/L (ref 135–145)
Total Bilirubin: 2.8 mg/dL — ABNORMAL HIGH (ref 0.3–1.2)
Total Protein: 6.8 g/dL (ref 6.5–8.1)

## 2022-05-28 LAB — BPAM RBC
Blood Product Expiration Date: 202311152359
ISSUE DATE / TIME: 202310142214
Unit Type and Rh: 5100

## 2022-05-28 LAB — IRON AND TIBC
Iron: 85 ug/dL (ref 28–170)
Saturation Ratios: 45 % — ABNORMAL HIGH (ref 10.4–31.8)
TIBC: 188 ug/dL — ABNORMAL LOW (ref 250–450)
UIBC: 103 ug/dL

## 2022-05-28 LAB — MAGNESIUM: Magnesium: 2 mg/dL (ref 1.7–2.4)

## 2022-05-28 LAB — FERRITIN: Ferritin: 3083 ng/mL — ABNORMAL HIGH (ref 11–307)

## 2022-05-28 LAB — BRAIN NATRIURETIC PEPTIDE: B Natriuretic Peptide: 1942 pg/mL — ABNORMAL HIGH (ref 0.0–100.0)

## 2022-05-28 LAB — TSH: TSH: 2.583 u[IU]/mL (ref 0.350–4.500)

## 2022-05-28 LAB — POTASSIUM: Potassium: 3.1 mmol/L — ABNORMAL LOW (ref 3.5–5.1)

## 2022-05-28 LAB — PHOSPHORUS: Phosphorus: 2.7 mg/dL (ref 2.5–4.6)

## 2022-05-28 MED ORDER — POTASSIUM CHLORIDE CRYS ER 20 MEQ PO TBCR
40.0000 meq | EXTENDED_RELEASE_TABLET | Freq: Once | ORAL | Status: AC
  Administered 2022-05-28: 40 meq via ORAL
  Filled 2022-05-28: qty 2

## 2022-05-28 NOTE — ED Notes (Signed)
When walking pass room  pt can be heard moaning. RN asked pt if she was in pain? Pt stated, "No, it makes my nausea feel better when I moan."

## 2022-05-28 NOTE — TOC Initial Note (Addendum)
Transition of Care [facility]) - Initial/Assessment Note    Patient Details  Name: Cindy Osborne MRN: [id number] Date of Birth: Jan 27, 1966  [facility]) CM/SW Contact:    Verdell Carmine, RN Phone Number: 05/28/2022, 12:06 PM  Clinical Narrative:                  Spoke to patient at bedside. She has home health through [facility], and has a aide come in to assist her. She has a walker and lift and all the DME she needs. She lives with her sister and mother, they can transport her home on DC. Sharmon Revere from [place] messaged to let her know of discharge.  1212 Let sister MS Dimas Millin know the patient will be discharging this afternoon.  Expected Discharge Plan: [place] Barriers to Discharge: No Barriers Identified   Patient Goals and CMS Choice Patient states their goals for this hospitalization and ongoing recovery are:: Feel better go back home   Choice offered to / list presented to : Patient  Expected Discharge Plan and Services Expected Discharge Plan: [place]   Discharge Planning Services: CM Consult Post Acute Care Choice: [place] arrangements for the past 2 months: Single Family Home                           HH Arranged: PT HH Agency: [place]   Time [place]: 1206 Representative spoke with at [place]: Malachy Mood  Prior Living Arrangements/Services Living arrangements for the past 2 months: [place] Lives with:: Relatives (SIster and mother) Patient language and need for interpreter reviewed:: Yes Do you feel safe going back to the place where you live?: Yes      Need for Family Participation in Patient Care: Yes (Comment) Care giver support system in place?: Yes (comment) Current home services: DME, Homehealth aide, Home PT Criminal Activity/Legal Involvement Pertinent to Current Situation/Hospitalization: No - Comment as needed  Activities of Daily Living       Permission Sought/Granted                  Emotional Assessment     Affect (typically observed): Appropriate Orientation: : Oriented to Self, Oriented to Place, Oriented to  Time, Oriented to Situation Alcohol / Substance Use: Not Applicable Psych Involvement: No (comment)  Admission diagnosis:  Intractable nausea and vomiting [R11.2] Patient Active Problem List   Diagnosis Date Noted   Intractable nausea and vomiting 05/27/2022   Weakness of left upper extremity 05/11/2022   History of stroke with current residual effects 05/08/2022   Mood disorder ([place]) 05/08/2022   Pressure injury of skin 04/05/2022   Hyperlipidemia 01/24/2022   Obesity 01/24/2022   Acute right arterial ischemic stroke, MCA (middle cerebral artery) ([place]) 01/21/2022   Combined forms of age-related cataract of both eyes 06/30/2021   Diabetic macular edema ([place]) 06/30/2021   Proteinuria 73/53/2992   Metabolic acidosis 42/68/3419   Encounter for insertion of mirena IUD 03/15/2021   Panic disorder 03/03/2021   Acquired absence of left leg below knee ([place]) 10/26/2020   PVD (peripheral vascular disease) ([place]) 07/28/2020   Uncontrolled type 2 diabetes mellitus with diabetic peripheral angiopathy without gangrene 02/26/2020   Acquired absence of right leg below knee ([place]) 07/28/2019   Diabetic retinopathy ([place]) 11/23/2016   Slow transit constipation 09/24/2015   Environmental and seasonal allergies 07/09/2015   Anemia associated  with chronic renal failure 07/09/2015   ESRD (end stage renal disease) on dialysis ([place]) 07/09/2015   Type II diabetes mellitus with complication ([place]) 40/35/2481   Herpes simplex infection 07/09/2015   Hyperlipidemia associated with type 2 diabetes mellitus ([place]) 07/09/2015   Heart & renal disease, hypertensive, with heart failure ([place]) 07/09/2015   Avitaminosis D 07/09/2015   Obstructive apnea 12/15/2014   Essential hypertension 09/11/2013   PCP:  Glean Hess,  MD Pharmacy:   [facility]: [place], [place] - [address], [address] [place] [address] Phone: [phone] Fax: [phone]  [facility] [address] [address] Phone: [phone] Fax: [phone]  [id number] - [place], [address] [place] [address] Phone: [phone] Fax: [phone]     Social Determinants of Health (SDOH) Interventions    Readmission Risk Interventions     No data to display

## 2022-05-28 NOTE — ED Notes (Signed)
Breakfast tray ordered 

## 2022-05-28 NOTE — Care Management CC44 (Signed)
Condition Code 44 Documentation Completed  Patient Details  Name: Cindy Osborne MRN: [id number] Date of Birth: 03/20/1966   Condition Code 44 given:  Yes Patient signature on Condition Code 44 notice:  Yes Documentation of 2 MD's agreement:  Yes Code 44 added to claim:  Yes   Verbal permission to sign Verdell Carmine, RN 05/28/2022, 12:09 PM

## 2022-05-28 NOTE — ED Notes (Signed)
Patient called out for RN to use restroom. Upon helping pt place her prosthetic legs on, pt decided to wait for her family to assist her. RN informed patient of needing a urine sample, pt agreeable and verbalized understanding.

## 2022-05-28 NOTE — Progress Notes (Signed)
PROGRESS NOTE        PATIENT DETAILS Name: Cindy Osborne Age: 56 y.o. Sex: female Date of Birth: [birth year] Admit Date: 05/27/2022 Admitting Physician Kayleen Memos, DO CLE:[id number], [name], MD  Brief Summary: Patient is a 57 y.o.  female with history of ESRD on HD TTS, PAD-s/p bilateral BKA, recent CVA with left sided mild hemiparesis, PAF on Eliquis, DM-2, HTN-who presented with 2-3-day history of nausea/vomiting.  Significant events: 10/14>> admit to [facility]-nausea/vomiting.  Significant studies: 10/14>> CXR: Small right pleural effusion-no PNA.  Significant microbiology data: None  Procedures: None  Consults: None  Subjective: Feels better-no vomiting for the past 12 hours.  No abdominal pain.  No diarrhea.  Objective: Vitals: Blood pressure 130/71, pulse 100, temperature 98.6 F (37 C), temperature source Oral, resp. rate (!) 22, height 5\' 9"  (1.753 m), weight 96.6 kg, SpO2 100 %.   Exam: Gen Exam:Alert awake-not in any distress HEENT:atraumatic, normocephalic Chest: B/L clear to auscultation anteriorly CVS:S1S2 regular Abdomen:soft non tender, non distended Extremities: Bilateral BKA-prosthesis in place. Neurology: Non focal Skin: no rash  Pertinent Labs/Radiology:    Latest Ref Rng & Units 05/27/2022    4:57 PM 05/27/2022    4:49 PM 05/23/2022    2:22 PM  CBC  WBC 4.0 - 10.5 K/uL  8.5  7.5   Hemoglobin 12.0 - 15.0 g/dL 8.5  7.6  8.7   Hematocrit 36.0 - 46.0 % 25.0  25.2  28.4   Platelets 150 - 400 K/uL  163  164     Lab Results  Component Value Date   NA 138 05/28/2022   K 3.0 (L) 05/28/2022   K 3.1 (L) 05/28/2022   CL 94 (L) 05/28/2022   CO2 27 05/28/2022      Assessment/Plan: Nausea/vomiting: Suspicion for viral syndrome Seems to have resolved with supportive care Tolerating clear liquids-advance diet Abdominal exam is completely benign If tolerates advancement in diet-should be able to go home later  today.  Hypokalemia: Due to GI loss Continue to replete and although better than on initial presentation. Hopefully now that GI symptoms are much better-K will continue to improve.  Normocytic anemia Denies any GI loss-no melena/hematochezia/hematemesis.  FOBT negative. Per history-claims she has had significant bleeding from her fistula when accessed-unclear if this is the etiology for worsening anemia. Suspect this is mostly multifactorial-from acute illness, ESRD-possible mild blood loss from fistula when access. Transfused 1 unit by admitting MD-hemoglobin now stable Follow with PCP/nephrology  History of CVA Some mild left hemiparesis Continue Eliquis Continue statin  PAF Continue Eliquis Beta-blocker remains on hold but BP slowly increasing-should be able to resume in the next day or so.  PAD-s/p bilateral BKA-prosthesis in place  ESRD on HD TTS We will consult nephrology if patient remains hospitalized  Obesity: Estimated body mass index is 31.45 kg/m as calculated from the following:   Height as of this encounter: 5\' 9"  (1.753 m).   Weight as of this encounter: 96.6 kg.   Code status:   Code Status: Full Code   DVT Prophylaxis: apixaban (ELIQUIS) tablet 5 mg    Family Communication: None at bedside   Disposition Plan: Status is: Inpatient Remains inpatient appropriate because: Advancing diet-may go home later today if no recurrence of nausea/vomiting.   Planned Discharge Destination:Home   Diet: Diet Order  Diet renal with fluid restriction Fluid restriction: 1200 mL Fluid; Room service appropriate? Yes; Fluid consistency: Thin  Diet effective now                     Antimicrobial agents: Anti-infectives (From admission, onward)    None        MEDICATIONS: Scheduled Meds:  amitriptyline  25 mg Oral QHS   apixaban  5 mg Oral BID   Continuous Infusions: PRN Meds:.acetaminophen, melatonin, polyethylene glycol,  prochlorperazine   I have personally reviewed following labs and imaging studies  LABORATORY DATA: CBC: Recent Labs  Lab 05/23/22 1422 05/27/22 1649 05/27/22 1657  WBC 7.5 8.5  --   NEUTROABS 5.1  --   --   HGB 8.7* 7.6* 8.5*  HCT 28.4* 25.2* 25.0*  MCV 90.2 94.7  --   PLT 164 163  --     Basic Metabolic Panel: Recent Labs  Lab 05/23/22 1422 05/27/22 1649 05/27/22 1657 05/28/22 0118  NA 139 139 137 138  K 2.5* 2.6* 2.7* 3.0*  3.1*  CL 94* 95* 94* 94*  CO2 29 25  --  27  GLUCOSE 97 108* 111* 123*  BUN 12 6 6 9   CREATININE 4.79* 3.75* 3.70* 4.56*  CALCIUM 9.1 8.7*  --  8.4*  MG  --   --   --  2.0  PHOS  --   --   --  2.7    GFR: Estimated Creatinine Clearance: 16.8 mL/min (A) (by C-G formula based on SCr of 4.56 mg/dL (H)).  Liver Function Tests: Recent Labs  Lab 05/23/22 1422 05/27/22 1649 05/28/22 0118  AST 17 25 30   ALT 9 16 21   ALKPHOS 129* 114 107  BILITOT 2.2* 2.4* 2.8*  PROT 8.0 7.7 6.8  ALBUMIN 3.6 3.3* 3.1*   Recent Labs  Lab 05/27/22 1649  LIPASE 26   No results for input(s): "AMMONIA" in the last 168 hours.  Coagulation Profile: No results for input(s): "INR", "PROTIME" in the last 168 hours.  Cardiac Enzymes: No results for input(s): "CKTOTAL", "CKMB", "CKMBINDEX", "TROPONINI" in the last 168 hours.  BNP (last 3 results) No results for input(s): "PROBNP" in the last 8760 hours.  Lipid Profile: No results for input(s): "CHOL", "HDL", "LDLCALC", "TRIG", "CHOLHDL", "LDLDIRECT" in the last 72 hours.  Thyroid Function Tests: Recent Labs    05/28/22 0118  TSH 2.583    Anemia Panel: Recent Labs    05/28/22 0118  FERRITIN 3,083*  TIBC 188*  IRON 85    Urine analysis:    Component Value Date/Time   BILIRUBINUR neg 06/09/2021 1128   PROTEINUR Positive (A) 06/09/2021 1128   UROBILINOGEN 0.2 06/09/2021 1128   NITRITE neg' 06/09/2021 1128   LEUKOCYTESUR Large (3+) (A) 06/09/2021 1128    Sepsis Labs: Lactic Acid,  Venous No results found for: "LATICACIDVEN"  MICROBIOLOGY: No results found for this or any previous visit (from the past 240 hour(s)).  RADIOLOGY STUDIES/RESULTS: DG CHEST PORT 1 VIEW  Result Date: 05/27/2022 CLINICAL DATA:  Dyspnea. EXAM: PORTABLE CHEST 1 VIEW COMPARISON:  None Available. FINDINGS: The heart is enlarged, unchanged. There is a small right pleural effusion. No pneumothorax. The visualized skeletal structures are unremarkable. IMPRESSION: 1. Small right pleural effusion. 2. Cardiomegaly. Electronically Signed   By: Ronney Asters M.D.   On: 05/27/2022 23:57     LOS: 1 day   Oren Binet, MD  [facility]    To contact the attending provider between 7A-7P  or the covering provider during after hours 7P-7A, please log into the web site [email or web address] and access using universal [place] password for that web site. If you do not have the password, please call the hospital operator.  05/28/2022, 10:00 AM

## 2022-05-28 NOTE — Discharge Summary (Signed)
PATIENT DETAILS Name: Cindy Osborne Age: 56 y.o. Sex: female Date of Birth: [birth year] MRN: [id number]. Admitting Physician: Kayleen Memos, DO [id number], [name], MD  Admit Date: 05/27/2022 Discharge date: 05/28/2022  Recommendations for Outpatient Follow-up:  Follow up with PCP in 1-2 weeks Please obtain CMP/CBC in one week   Admitted From:  Home  Disposition: Home   Discharge Condition: good  CODE STATUS:   Code Status: Full Code   Diet recommendation:  Diet Order             Diet renal with fluid restriction Fluid restriction: 1200 mL Fluid; Room service appropriate? Yes; Fluid consistency: Thin  Diet effective now           Diet - low sodium heart healthy                    Brief Summary: Patient is a 56 y.o.  female with history of ESRD on HD TTS, PAD-s/p bilateral BKA, recent CVA with left sided mild hemiparesis, PAF on Eliquis, DM-2, HTN-who presented with 2-3-day history of nausea/vomiting.   Significant events: 10/14>> admit to [facility]-nausea/vomiting.   Significant studies: 10/14>> CXR: Small right pleural effusion-no PNA.   Significant microbiology data: None   Procedures: None   Consults: None  Brief Hospital Course: Nausea/vomiting: Likely due to viral syndrome Abdominal exam benign-having BMs-no evidence of SBO Started on clears-has tolerated advancement in diet Stable for discharge.   Hypokalemia: Due to GI loss Continue to replete and although better than on initial presentation. Hopefully now that GI symptoms are much better-K will continue to improve. Repeat electrolytes at PCPs office/or with nephrology.   Normocytic anemia Denies any GI loss-no melena/hematochezia/hematemesis.  FOBT negative. Per history-claims she has had significant bleeding from her fistula when accessed-unclear if this is the etiology for worsening anemia. Suspect this is mostly multifactorial-from acute illness, ESRD-possible mild blood loss  from fistula when access. Transfused 1 unit by admitting MD-hemoglobin now stable Follow with PCP/nephrology-and continue to monitor CBC closely. Defer iron/EPO to nephrology service.   History of CVA Some mild left hemiparesis Continue Eliquis Continue statin   PAF Continue Eliquis Beta-blocker remains on hold but BP slowly increasing-should be able to resume in the next day or so.   PAD-s/p bilateral BKA-prosthesis in place   ESRD on HD TTS Resume outpatient HD. Completed her scheduled hemodialysis Saturday  Mildly prolonged QTc Likely due to hypokalemia Potassium being supplemented-already better than initial presentation Repeat twelve-lead EKG at PCPs/nephrologist office-and resume amitriptyline accordingly.  Holding amitriptyline on discharge.   Obesity: Estimated body mass index is 31.45 kg/m as calculated from the following:   Height as of this encounter: 5\' 9"  (1.753 m).   Weight as of this encounter: 96.6 kg.   Discharge Diagnoses:  Principal Problem:   Intractable nausea and vomiting   Discharge Instructions:  Activity:  As tolerated with Full fall precautions use walker/cane & assistance as needed   Discharge Instructions     Call MD for:  difficulty breathing, headache or visual disturbances   Complete by: As directed    Call MD for:  persistant dizziness or light-headedness   Complete by: As directed    Diet - low sodium heart healthy   Complete by: As directed    Discharge instructions   Complete by: As directed    Follow with Primary MD  Glean Hess, MD in 1-2 weeks  Please get a complete blood count and chemistry panel checked by  your primary MD or your primary nephrologist at your next visit, and again as instructed by your Primary MD.  You required a blood transfusion-please ask your primary nephrologist to check your complete blood count at next visit.  They could do blood work with your next dialysis session.  Hold amitriptyline for  now as QTc is still slightly prolonged.  Please ask your primary care practitioner or your primary nephrologist to repeat twelve-lead EKG in a few days, before considering resuming amitriptyline.  Get Medicines reviewed and adjusted: Please take all your medications with you for your next visit with your Primary MD  Laboratory/radiological data: Please request your Primary MD to go over all hospital tests and procedure/radiological results at the follow up, please ask your Primary MD to get all Hospital records sent to his/her office.  In some cases, they will be blood work, cultures and biopsy results pending at the time of your discharge. Please request that your primary care M.D. follows up on these results.  Also Note the following: If you experience worsening of your admission symptoms, develop shortness of breath, life threatening emergency, suicidal or homicidal thoughts you must seek medical attention immediately by calling 911 or calling your MD immediately  if symptoms less severe.  You must read complete instructions/literature along with all the possible adverse reactions/side effects for all the Medicines you take and that have been prescribed to you. Take any new Medicines after you have completely understood and accpet all the possible adverse reactions/side effects.   Do not drive when taking Pain medications or sleeping medications (Benzodaizepines)  Do not take more than prescribed Pain, Sleep and Anxiety Medications. It is not advisable to combine anxiety,sleep and pain medications without talking with your primary care practitioner  Special Instructions: If you have smoked or chewed Tobacco  in the last 2 yrs please stop smoking, stop any regular Alcohol  and or any Recreational drug use.  Wear Seat belts while driving.  Please note: You were cared for by a hospitalist during your hospital stay. Once you are discharged, your primary care physician will handle any further  medical issues. Please note that NO REFILLS for any discharge medications will be authorized once you are discharged, as it is imperative that you return to your primary care physician (or establish a relationship with a primary care physician if you do not have one) for your post hospital discharge needs so that they can reassess your need for medications and monitor your lab values.   Increase activity slowly   Complete by: As directed       Allergies as of 05/28/2022       Reactions   Augmentin [amoxicillin-pot Clavulanate] Shortness Of Breath   Atorvastatin Other (See Comments)   Back pain   Pioglitazone Other (See Comments)   Edema   Peanut-containing Drug Products Itching   Rosuvastatin Itching, Other (See Comments)   Back pain   Shellfish-derived Products Itching, Rash   shrimp   Sulfa Antibiotics Rash, Other (See Comments)   Shedding of skin        Medication List     STOP taking these medications    amitriptyline 25 MG tablet Commonly known as: ELAVIL       TAKE these medications    acetaminophen 325 MG tablet Commonly known as: TYLENOL Take 1-2 tablets (325-650 mg total) by mouth every 4 (four) hours as needed for mild pain.   apixaban 5 MG Tabs tablet Commonly known as: ELIQUIS Take  1 tablet (5 mg total) by mouth 2 (two) times daily.   cinacalcet 30 MG tablet Commonly known as: SENSIPAR Take 3 tablets (90 mg total) by mouth Every Tuesday,Thursday,and Saturday with dialysis.   linagliptin 5 MG Tabs tablet Commonly known as: TRADJENTA Take 1 tablet (5 mg total) by mouth daily.   liver oil-zinc oxide 40 % ointment Commonly known as: DESITIN Apply topically 2 (two) times daily. To buttocks   metoprolol 200 MG 24 hr tablet Commonly known as: TOPROL-XL Take 1 tablet (200 mg total) by mouth daily. Take with or immediately following a meal.   pravastatin 40 MG tablet Commonly known as: PRAVACHOL Take 1 tablet (40 mg total) by mouth daily at 6  PM. Start taking on: June 02, 2022        Follow-up Information     Glean Hess, MD Follow up in 1 week(s).   Specialty: Internal Medicine Contact information: [address] [place] [address] [phone]         Hemodialysis center Follow up.   Why: Follow at your usual schedule.               Allergies  Allergen Reactions   Augmentin [Amoxicillin-Pot Clavulanate] Shortness Of Breath   Atorvastatin Other (See Comments)    Back pain   Pioglitazone Other (See Comments)    Edema   Peanut-Containing Drug Products Itching   Rosuvastatin Itching and Other (See Comments)    Back pain   Shellfish-Derived Products Itching and Rash    shrimp   Sulfa Antibiotics Rash and Other (See Comments)    Shedding of skin     Other Procedures/Studies: DG CHEST PORT 1 VIEW  Result Date: 05/27/2022 CLINICAL DATA:  Dyspnea. EXAM: PORTABLE CHEST 1 VIEW COMPARISON:  None Available. FINDINGS: The heart is enlarged, unchanged. There is a small right pleural effusion. No pneumothorax. The visualized skeletal structures are unremarkable. IMPRESSION: 1. Small right pleural effusion. 2. Cardiomegaly. Electronically Signed   By: Ronney Asters M.D.   On: 05/27/2022 23:57     TODAY-DAY OF DISCHARGE:  Subjective:   Cindy Osborne today has no headache,no chest abdominal pain,no new weakness tingling or numbness, feels much better wants to go home today.   Objective:   Blood pressure 130/71, pulse 100, temperature 98.6 F (37 C), temperature source Oral, resp. rate (!) 22, height 5\' 9"  (1.753 m), weight 96.6 kg, SpO2 100 %.  Intake/Output Summary (Last 24 hours) at 05/28/2022 1216 Last data filed at 05/28/2022 0314 Gross per 24 hour  Intake 340 ml  Output --  Net 340 ml   Filed Weights   05/27/22 1622  Weight: 96.6 kg    Exam: Awake Alert, Oriented *3, No new F.N deficits, Normal affect [place].AT,PERRAL Supple Neck,No JVD, No cervical lymphadenopathy  appriciated.  Symmetrical Chest wall movement, Good air movement bilaterally, CTAB RRR,No Gallops,Rubs or new Murmurs, No Parasternal Heave +ve B.Sounds, Abd Soft, Non tender, No organomegaly appriciated, No rebound -guarding or rigidity. No Cyanosis, Clubbing or edema, No new Rash or bruise   PERTINENT RADIOLOGIC STUDIES: DG CHEST PORT 1 VIEW  Result Date: 05/27/2022 CLINICAL DATA:  Dyspnea. EXAM: PORTABLE CHEST 1 VIEW COMPARISON:  None Available. FINDINGS: The heart is enlarged, unchanged. There is a small right pleural effusion. No pneumothorax. The visualized skeletal structures are unremarkable. IMPRESSION: 1. Small right pleural effusion. 2. Cardiomegaly. Electronically Signed   By: Ronney Asters M.D.   On: 05/27/2022 23:57     PERTINENT  LAB RESULTS: CBC: Recent Labs    05/27/22 1649 05/27/22 1657  WBC 8.5  --   HGB 7.6* 8.5*  HCT 25.2* 25.0*  PLT 163  --    CMET CMP     Component Value Date/Time   NA 138 05/28/2022 0118   NA 136 (A) 02/02/2021 0000   K 3.0 (L) 05/28/2022 0118   K 3.1 (L) 05/28/2022 0118   CL 94 (L) 05/28/2022 0118   CO2 27 05/28/2022 0118   GLUCOSE 123 (H) 05/28/2022 0118   BUN 9 05/28/2022 0118   BUN 112 (A) 02/02/2021 0000   CREATININE 4.56 (H) 05/28/2022 0118   CALCIUM 8.4 (L) 05/28/2022 0118   PROT 6.8 05/28/2022 0118   PROT 7.1 06/30/2020 1510   ALBUMIN 3.1 (L) 05/28/2022 0118   ALBUMIN 4.2 06/30/2020 1510   AST 30 05/28/2022 0118   ALT 21 05/28/2022 0118   ALKPHOS 107 05/28/2022 0118   BILITOT 2.8 (H) 05/28/2022 0118   BILITOT 0.2 06/30/2020 1510   GFRNONAA 11 (L) 05/28/2022 0118   GFRAA 4 02/02/2021 0000    GFR Estimated Creatinine Clearance: 16.8 mL/min (A) (by C-G formula based on SCr of 4.56 mg/dL (H)). Recent Labs    05/27/22 1649  LIPASE 26   No results for input(s): "CKTOTAL", "CKMB", "CKMBINDEX", "TROPONINI" in the last 72 hours. Invalid input(s): "POCBNP" No results for input(s): "DDIMER" in the last 72 hours. No  results for input(s): "HGBA1C" in the last 72 hours. No results for input(s): "CHOL", "HDL", "LDLCALC", "TRIG", "CHOLHDL", "LDLDIRECT" in the last 72 hours. Recent Labs    05/28/22 0118  TSH 2.583   Recent Labs    05/28/22 0118  FERRITIN 3,083*  TIBC 188*  IRON 85   Coags: No results for input(s): "INR" in the last 72 hours.  Invalid input(s): "PT" Microbiology: No results found for this or any previous visit (from the past 240 hour(s)).  FURTHER DISCHARGE INSTRUCTIONS:  Get Medicines reviewed and adjusted: Please take all your medications with you for your next visit with your Primary MD  Laboratory/radiological data: Please request your Primary MD to go over all hospital tests and procedure/radiological results at the follow up, please ask your Primary MD to get all Hospital records sent to his/her office.  In some cases, they will be blood work, cultures and biopsy results pending at the time of your discharge. Please request that your primary care M.D. goes through all the records of your hospital data and follows up on these results.  Also Note the following: If you experience worsening of your admission symptoms, develop shortness of breath, life threatening emergency, suicidal or homicidal thoughts you must seek medical attention immediately by calling 911 or calling your MD immediately  if symptoms less severe.  You must read complete instructions/literature along with all the possible adverse reactions/side effects for all the Medicines you take and that have been prescribed to you. Take any new Medicines after you have completely understood and accpet all the possible adverse reactions/side effects.   Do not drive when taking Pain medications or sleeping medications (Benzodaizepines)  Do not take more than prescribed Pain, Sleep and Anxiety Medications. It is not advisable to combine anxiety,sleep and pain medications without talking with your primary care  practitioner  Special Instructions: If you have smoked or chewed Tobacco  in the last 2 yrs please stop smoking, stop any regular Alcohol  and or any Recreational drug use.  Wear Seat belts while driving.  Please  note: You were cared for by a hospitalist during your hospital stay. Once you are discharged, your primary care physician will handle any further medical issues. Please note that NO REFILLS for any discharge medications will be authorized once you are discharged, as it is imperative that you return to your primary care physician (or establish a relationship with a primary care physician if you do not have one) for your post hospital discharge needs so that they can reassess your need for medications and monitor your lab values.  Total Time spent coordinating discharge including counseling, education and face to face time equals greater than 30 minutes.  SignedOren Binet 05/28/2022 12:16 PM

## 2022-05-28 NOTE — ED Notes (Signed)
Provider stated if pt can tolerate food she can be d/c

## 2022-05-28 NOTE — ED Notes (Signed)
Pt stated she feels nauseous after eating jello and broth. RN will administer PRN medication

## 2022-05-28 NOTE — ED Notes (Signed)
RN contacted [name] to pickup pt.

## 2022-05-28 NOTE — ED Notes (Signed)
Pt provided a [place] sandwich d/t breakfast tray not arriving.   Pt tolerated PO food/beverage fine. EKG repeated.  Oren Binet MD aware

## 2022-05-28 NOTE — Care Management Obs Status (Signed)
[place] NOTIFICATION   Patient Details  Name: Shar Paez MRN: [id number] Date of Birth: Feb 28, 1966   Medicare Observation Status Notification Given:  Yes    Verdell Carmine, RN 05/28/2022, 12:08 PM

## 2022-05-29 ENCOUNTER — Telehealth: Payer: Self-pay

## 2022-05-29 ENCOUNTER — Telehealth: Payer: Self-pay | Admitting: Internal Medicine

## 2022-05-29 NOTE — Telephone Encounter (Signed)
Copied from [place] [phone]. Topic: General - Other >> May 29, 2022 11:45 AM Leitha Schuller wrote: Caller states medicaid denied request for wheelchair stating the word "addendum " was used but nothing was addend  Caller requesting to speak w/ someone regarding denial before resubmitting it  Please assist further

## 2022-05-29 NOTE — Telephone Encounter (Signed)
Transition Care Management Unsuccessful Follow-up Telephone Call  Date of discharge and from where:  [facility] 05/[facility]/20[facility]  Attempts:  2nd Attempt  Reason for unsuccessful TCM follow-up call:  Left voice message

## 2022-05-29 NOTE — Telephone Encounter (Signed)
Transition Care Management Unsuccessful Follow-up Telephone Call  Date of discharge and from where:  [facility] 05/28/2022  Attempts:  1st Attempt  Reason for unsuccessful TCM follow-up call:  Left voice message

## 2022-05-29 NOTE — Telephone Encounter (Signed)
Called and left patient a VM letting her know that I reviewed the paperwork for the wheel chair. It was sent to [employer] and the word addendum or addend is not on there.   Explained its possible the medical supply company could have wrote this on the paper work after receiving it but we did not.   Asked her to call back if she needs anything further.

## 2022-05-30 ENCOUNTER — Other Ambulatory Visit: Payer: Self-pay

## 2022-05-30 ENCOUNTER — Emergency Department: Payer: Medicare Other

## 2022-05-30 ENCOUNTER — Inpatient Hospital Stay
Admission: EM | Admit: 2022-05-30 | Discharge: 2022-06-03 | DRG: 871 | Disposition: A | Discharge status: Home / Self Care | Payer: Medicare Other | Attending: Internal Medicine | Admitting: Internal Medicine

## 2022-05-30 ENCOUNTER — Encounter: Payer: Self-pay | Admitting: Emergency Medicine

## 2022-05-30 DIAGNOSIS — E041 Nontoxic single thyroid nodule: Secondary | ICD-10-CM | POA: Diagnosis present

## 2022-05-30 DIAGNOSIS — E1122 Type 2 diabetes mellitus with diabetic chronic kidney disease: Secondary | ICD-10-CM | POA: Diagnosis present

## 2022-05-30 DIAGNOSIS — E1151 Type 2 diabetes mellitus with diabetic peripheral angiopathy without gangrene: Secondary | ICD-10-CM | POA: Diagnosis present

## 2022-05-30 DIAGNOSIS — I480 Paroxysmal atrial fibrillation: Secondary | ICD-10-CM | POA: Diagnosis present

## 2022-05-30 DIAGNOSIS — Z91013 Allergy to seafood: Secondary | ICD-10-CM

## 2022-05-30 DIAGNOSIS — Z6832 Body mass index (BMI) 32.0-32.9, adult: Secondary | ICD-10-CM

## 2022-05-30 DIAGNOSIS — N2581 Secondary hyperparathyroidism of renal origin: Secondary | ICD-10-CM | POA: Diagnosis present

## 2022-05-30 DIAGNOSIS — E1129 Type 2 diabetes mellitus with other diabetic kidney complication: Secondary | ICD-10-CM | POA: Diagnosis present

## 2022-05-30 DIAGNOSIS — Z881 Allergy status to other antibiotic agents status: Secondary | ICD-10-CM

## 2022-05-30 DIAGNOSIS — Z888 Allergy status to other drugs, medicaments and biological substances status: Secondary | ICD-10-CM

## 2022-05-30 DIAGNOSIS — R7401 Elevation of levels of liver transaminase levels: Secondary | ICD-10-CM | POA: Diagnosis present

## 2022-05-30 DIAGNOSIS — I10 Essential (primary) hypertension: Secondary | ICD-10-CM | POA: Diagnosis present

## 2022-05-30 DIAGNOSIS — K7200 Acute and subacute hepatic failure without coma: Secondary | ICD-10-CM | POA: Diagnosis present

## 2022-05-30 DIAGNOSIS — E871 Hypo-osmolality and hyponatremia: Secondary | ICD-10-CM | POA: Diagnosis not present

## 2022-05-30 DIAGNOSIS — N186 End stage renal disease: Secondary | ICD-10-CM

## 2022-05-30 DIAGNOSIS — R571 Hypovolemic shock: Secondary | ICD-10-CM | POA: Diagnosis present

## 2022-05-30 DIAGNOSIS — J9811 Atelectasis: Secondary | ICD-10-CM | POA: Diagnosis present

## 2022-05-30 DIAGNOSIS — E876 Hypokalemia: Secondary | ICD-10-CM | POA: Diagnosis not present

## 2022-05-30 DIAGNOSIS — Z91010 Allergy to peanuts: Secondary | ICD-10-CM

## 2022-05-30 DIAGNOSIS — I6930 Unspecified sequelae of cerebral infarction: Secondary | ICD-10-CM

## 2022-05-30 DIAGNOSIS — J9601 Acute respiratory failure with hypoxia: Secondary | ICD-10-CM | POA: Diagnosis present

## 2022-05-30 DIAGNOSIS — Z9049 Acquired absence of other specified parts of digestive tract: Secondary | ICD-10-CM

## 2022-05-30 DIAGNOSIS — J189 Pneumonia, unspecified organism: Secondary | ICD-10-CM | POA: Diagnosis present

## 2022-05-30 DIAGNOSIS — Z89511 Acquired absence of right leg below knee: Secondary | ICD-10-CM

## 2022-05-30 DIAGNOSIS — D631 Anemia in chronic kidney disease: Secondary | ICD-10-CM | POA: Diagnosis present

## 2022-05-30 DIAGNOSIS — I700 Atherosclerosis of aorta: Secondary | ICD-10-CM | POA: Diagnosis present

## 2022-05-30 DIAGNOSIS — Z7901 Long term (current) use of anticoagulants: Secondary | ICD-10-CM

## 2022-05-30 DIAGNOSIS — Z975 Presence of (intrauterine) contraceptive device: Secondary | ICD-10-CM

## 2022-05-30 DIAGNOSIS — Z89512 Acquired absence of left leg below knee: Secondary | ICD-10-CM

## 2022-05-30 DIAGNOSIS — R6521 Severe sepsis with septic shock: Secondary | ICD-10-CM | POA: Diagnosis present

## 2022-05-30 DIAGNOSIS — Z882 Allergy status to sulfonamides status: Secondary | ICD-10-CM

## 2022-05-30 DIAGNOSIS — E872 Acidosis, unspecified: Secondary | ICD-10-CM | POA: Diagnosis present

## 2022-05-30 DIAGNOSIS — I739 Peripheral vascular disease, unspecified: Secondary | ICD-10-CM | POA: Diagnosis present

## 2022-05-30 DIAGNOSIS — R7989 Other specified abnormal findings of blood chemistry: Secondary | ICD-10-CM | POA: Diagnosis not present

## 2022-05-30 DIAGNOSIS — E669 Obesity, unspecified: Secondary | ICD-10-CM | POA: Diagnosis present

## 2022-05-30 DIAGNOSIS — J90 Pleural effusion, not elsewhere classified: Secondary | ICD-10-CM | POA: Diagnosis present

## 2022-05-30 DIAGNOSIS — R6520 Severe sepsis without septic shock: Secondary | ICD-10-CM | POA: Diagnosis not present

## 2022-05-30 DIAGNOSIS — I132 Hypertensive heart and chronic kidney disease with heart failure and with stage 5 chronic kidney disease, or end stage renal disease: Secondary | ICD-10-CM | POA: Diagnosis present

## 2022-05-30 DIAGNOSIS — I69354 Hemiplegia and hemiparesis following cerebral infarction affecting left non-dominant side: Secondary | ICD-10-CM | POA: Diagnosis not present

## 2022-05-30 DIAGNOSIS — Y95 Nosocomial condition: Secondary | ICD-10-CM | POA: Diagnosis present

## 2022-05-30 DIAGNOSIS — Z89421 Acquired absence of other right toe(s): Secondary | ICD-10-CM

## 2022-05-30 DIAGNOSIS — Z7984 Long term (current) use of oral hypoglycemic drugs: Secondary | ICD-10-CM

## 2022-05-30 DIAGNOSIS — E785 Hyperlipidemia, unspecified: Secondary | ICD-10-CM | POA: Diagnosis not present

## 2022-05-30 DIAGNOSIS — Z992 Dependence on renal dialysis: Secondary | ICD-10-CM

## 2022-05-30 DIAGNOSIS — Z9889 Other specified postprocedural states: Secondary | ICD-10-CM

## 2022-05-30 DIAGNOSIS — R791 Abnormal coagulation profile: Secondary | ICD-10-CM | POA: Diagnosis present

## 2022-05-30 DIAGNOSIS — Z833 Family history of diabetes mellitus: Secondary | ICD-10-CM

## 2022-05-30 DIAGNOSIS — K5710 Diverticulosis of small intestine without perforation or abscess without bleeding: Secondary | ICD-10-CM | POA: Diagnosis present

## 2022-05-30 DIAGNOSIS — A419 Sepsis, unspecified organism: Principal | ICD-10-CM | POA: Diagnosis present

## 2022-05-30 DIAGNOSIS — Z1152 Encounter for screening for COVID-19: Secondary | ICD-10-CM | POA: Diagnosis not present

## 2022-05-30 DIAGNOSIS — I5032 Chronic diastolic (congestive) heart failure: Secondary | ICD-10-CM | POA: Diagnosis present

## 2022-05-30 DIAGNOSIS — Z79899 Other long term (current) drug therapy: Secondary | ICD-10-CM

## 2022-05-30 LAB — LACTIC ACID, PLASMA
Lactic Acid, Venous: 5.9 mmol/L (ref 0.5–1.9)
Lactic Acid, Venous: 8.4 mmol/L (ref 0.5–1.9)
Lactic Acid, Venous: 3.7 mmol/L (ref 0.5–1.9)
Lactic Acid, Venous: 3.5 mmol/L (ref 0.5–1.9)
Lactic Acid, Venous: 1.9 mmol/L (ref 0.5–1.9)

## 2022-05-30 LAB — CBC WITH DIFFERENTIAL/PLATELET
Abs Immature Granulocytes: 0.15 10*3/uL — ABNORMAL HIGH (ref 0.00–0.07)
Basophils Absolute: 0 10*3/uL (ref 0.0–0.1)
Basophils Relative: 0 %
Eosinophils Absolute: 0 10*3/uL (ref 0.0–0.5)
Eosinophils Relative: 0 %
HCT: 28.4 % — ABNORMAL LOW (ref 36.0–46.0)
Hemoglobin: 8.4 g/dL — ABNORMAL LOW (ref 12.0–15.0)
Immature Granulocytes: 2 %
Lymphocytes Relative: 8 %
Lymphs Abs: 0.7 10*3/uL (ref 0.7–4.0)
MCH: 28.7 pg (ref 26.0–34.0)
MCHC: 29.6 g/dL — ABNORMAL LOW (ref 30.0–36.0)
MCV: 96.9 fL (ref 80.0–100.0)
Monocytes Absolute: 0.3 10*3/uL (ref 0.1–1.0)
Monocytes Relative: 4 %
Neutro Abs: 7.7 10*3/uL (ref 1.7–7.7)
Neutrophils Relative %: 86 %
Platelets: 213 10*3/uL (ref 150–400)
RBC: 2.93 MIL/uL — ABNORMAL LOW (ref 3.87–5.11)
RDW: 18.4 % — ABNORMAL HIGH (ref 11.5–15.5)
WBC: 8.9 10*3/uL (ref 4.0–10.5)
nRBC: 0.6 % — ABNORMAL HIGH (ref 0.0–0.2)

## 2022-05-30 LAB — RESP PANEL BY RT-PCR (FLU A&B, COVID) ARPGX2
Influenza A by PCR: NEGATIVE
Influenza B by PCR: NEGATIVE
SARS Coronavirus 2 by RT PCR: NEGATIVE

## 2022-05-30 LAB — COMPREHENSIVE METABOLIC PANEL
ALT: 407 U/L — ABNORMAL HIGH (ref 0–44)
AST: 720 U/L — ABNORMAL HIGH (ref 15–41)
Albumin: 3.5 g/dL (ref 3.5–5.0)
Alkaline Phosphatase: 175 U/L — ABNORMAL HIGH (ref 38–126)
Anion gap: 22 — ABNORMAL HIGH (ref 5–15)
BUN: 32 mg/dL — ABNORMAL HIGH (ref 6–20)
CO2: 21 mmol/L — ABNORMAL LOW (ref 22–32)
Calcium: 9.8 mg/dL (ref 8.9–10.3)
Chloride: 96 mmol/L — ABNORMAL LOW (ref 98–111)
Creatinine, Ser: 8.24 mg/dL — ABNORMAL HIGH (ref 0.44–1.00)
GFR, Estimated: 5 mL/min — ABNORMAL LOW (ref >60)
Glucose, Bld: 225 mg/dL — ABNORMAL HIGH (ref 70–99)
Potassium: 4.3 mmol/L (ref 3.5–5.1)
Sodium: 139 mmol/L (ref 135–145)
Total Bilirubin: 6.7 mg/dL — ABNORMAL HIGH (ref 0.3–1.2)
Total Protein: 7.8 g/dL (ref 6.5–8.1)

## 2022-05-30 LAB — BLOOD GAS, ARTERIAL
Acid-base deficit: 0.1 mmol/L (ref 0.0–2.0)
Bicarbonate: 23.3 mmol/L (ref 20.0–28.0)
Drawn by: 39898
O2 Content: 15 L/min
O2 Saturation: 99.9 %
Patient temperature: 36
pCO2 arterial: 31 mmHg — ABNORMAL LOW (ref 32–48)
pH, Arterial: 7.49 — ABNORMAL HIGH (ref 7.35–7.45)
pO2, Arterial: 221 mmHg — ABNORMAL HIGH (ref 83–108)

## 2022-05-30 LAB — CBG MONITORING, ED
Glucose-Capillary: 136 mg/dL — ABNORMAL HIGH (ref 70–99)
Glucose-Capillary: 90 mg/dL (ref 70–99)

## 2022-05-30 LAB — PROTIME-INR
INR: 2.8 — ABNORMAL HIGH (ref 0.8–1.2)
Prothrombin Time: 29 seconds — ABNORMAL HIGH (ref 11.4–15.2)

## 2022-05-30 LAB — APTT: aPTT: 41 seconds — ABNORMAL HIGH (ref 24–36)

## 2022-05-30 LAB — PROCALCITONIN: Procalcitonin: 0.43 ng/mL

## 2022-05-30 LAB — LACTATE DEHYDROGENASE: LDH: 1695 U/L — ABNORMAL HIGH (ref 98–192)

## 2022-05-30 LAB — LIPASE, BLOOD: Lipase: 33 U/L (ref 11–51)

## 2022-05-30 LAB — ACETAMINOPHEN LEVEL: Acetaminophen (Tylenol), Serum: <10 ug/mL — ABNORMAL LOW (ref 10–30)

## 2022-05-30 MED ORDER — PRAVASTATIN SODIUM 20 MG PO TABS: 40.0000 mg | ORAL_TABLET | Freq: Every day | ORAL | Status: DC

## 2022-05-30 MED ORDER — INSULIN ASPART 100 UNIT/ML IJ SOLN: 0.0000 [IU] | Freq: Every day | INTRAMUSCULAR | Status: DC

## 2022-05-30 MED ORDER — VANCOMYCIN HCL IN DEXTROSE 1-5 GM/200ML-% IV SOLN
1000.0000 mg | INTRAVENOUS | Status: DC
  Administered 2022-06-01: 1000 mg via INTRAVENOUS
  Filled 2022-05-30: qty 200

## 2022-05-30 MED ORDER — IBUPROFEN 400 MG PO TABS
400.0000 mg | ORAL_TABLET | Freq: Four times a day (QID) | ORAL | Status: DC | PRN
  Administered 2022-06-03: 400 mg via ORAL
  Filled 2022-05-30: qty 1

## 2022-05-30 MED ORDER — SODIUM CHLORIDE 0.9 % IV BOLUS (SEPSIS)
500.0000 mL | Freq: Once | INTRAVENOUS | Status: AC
  Administered 2022-05-30: 500 mL via INTRAVENOUS

## 2022-05-30 MED ORDER — ALBUTEROL SULFATE (2.5 MG/3ML) 0.083% IN NEBU
2.5000 mg | INHALATION_SOLUTION | RESPIRATORY_TRACT | Status: DC | PRN
  Administered 2022-06-02: 2.5 mg via RESPIRATORY_TRACT
  Filled 2022-05-30: qty 3

## 2022-05-30 MED ORDER — APIXABAN 5 MG PO TABS
5.0000 mg | ORAL_TABLET | Freq: Two times a day (BID) | ORAL | Status: DC
  Administered 2022-05-30 – 2022-06-03 (×8): 5 mg via ORAL
  Filled 2022-05-30 (×8): qty 1

## 2022-05-30 MED ORDER — INSULIN ASPART 100 UNIT/ML IJ SOLN
0.0000 [IU] | Freq: Three times a day (TID) | INTRAMUSCULAR | Status: DC
  Administered 2022-05-30: 1 [IU] via SUBCUTANEOUS
  Administered 2022-05-31: 3 [IU] via SUBCUTANEOUS
  Administered 2022-06-02: 2 [IU] via SUBCUTANEOUS
  Filled 2022-05-30 (×3): qty 1

## 2022-05-30 MED ORDER — DM-GUAIFENESIN ER 30-600 MG PO TB12: 1.0000 | ORAL_TABLET | Freq: Two times a day (BID) | ORAL | Status: DC | PRN

## 2022-05-30 MED ORDER — MIDODRINE HCL 5 MG PO TABS
10.0000 mg | ORAL_TABLET | Freq: Three times a day (TID) | ORAL | Status: DC
  Administered 2022-05-30 – 2022-06-03 (×9): 10 mg via ORAL
  Filled 2022-05-30 (×9): qty 2

## 2022-05-30 MED ORDER — SODIUM CHLORIDE 0.9 % IV SOLN
2.0000 g | Freq: Once | INTRAVENOUS | Status: AC
  Administered 2022-05-30: 2 g via INTRAVENOUS
  Filled 2022-05-30: qty 12.5

## 2022-05-30 MED ORDER — METOPROLOL TARTRATE 5 MG/5ML IV SOLN: 5.0000 mg | INTRAVENOUS | Status: DC | PRN

## 2022-05-30 MED ORDER — IOHEXOL 300 MG/ML  SOLN
100.0000 mL | Freq: Once | INTRAMUSCULAR | Status: AC | PRN
  Administered 2022-05-30: 100 mL via INTRAVENOUS

## 2022-05-30 MED ORDER — CHLORHEXIDINE GLUCONATE CLOTH 2 % EX PADS
6.0000 | MEDICATED_PAD | Freq: Every day | CUTANEOUS | Status: DC
  Administered 2022-06-02 – 2022-06-03 (×2): 6 via TOPICAL
  Filled 2022-05-30 (×2): qty 6

## 2022-05-30 MED ORDER — SODIUM CHLORIDE 0.9 % IV SOLN
1.0000 g | INTRAVENOUS | Status: DC
  Administered 2022-05-31: 1 g via INTRAVENOUS
  Filled 2022-05-30 (×3): qty 10

## 2022-05-30 MED ORDER — METRONIDAZOLE 500 MG/100ML IV SOLN
500.0000 mg | Freq: Once | INTRAVENOUS | Status: AC
  Administered 2022-05-30: 500 mg via INTRAVENOUS
  Filled 2022-05-30: qty 100

## 2022-05-30 MED ORDER — DIPHENHYDRAMINE HCL 50 MG/ML IJ SOLN: 12.5000 mg | Freq: Three times a day (TID) | INTRAMUSCULAR | Status: DC | PRN

## 2022-05-30 MED ORDER — VANCOMYCIN HCL IN DEXTROSE 1-5 GM/200ML-% IV SOLN
1000.0000 mg | Freq: Once | INTRAVENOUS | Status: AC
  Administered 2022-05-30: 1000 mg via INTRAVENOUS
  Filled 2022-05-30: qty 200

## 2022-05-30 MED ORDER — SODIUM CHLORIDE 0.9 % IV BOLUS (SEPSIS)
2500.0000 mL | Freq: Once | INTRAVENOUS | Status: AC
  Administered 2022-05-30: 2500 mL via INTRAVENOUS

## 2022-05-30 MED ORDER — ACETAMINOPHEN 325 MG PO TABS: 650.0000 mg | ORAL_TABLET | Freq: Four times a day (QID) | ORAL | Status: DC | PRN

## 2022-05-30 MED ORDER — LACTATED RINGERS IV SOLN: INTRAVENOUS | Status: DC

## 2022-05-30 NOTE — Assessment & Plan Note (Signed)
-   Hold blood pressure medications due to hypotension

## 2022-05-30 NOTE — Assessment & Plan Note (Signed)
-   Consulted Dr. Theador Hawthorne of nephrology for dialysis

## 2022-05-30 NOTE — Progress Notes (Signed)
Cindy Osborne  MRN: [id number]  DOB/AGE: 57/24/1967 57 y.o.  Primary Care Physician:Berglund, [name], MD  Admit date: 05/30/2022  Chief Complaint:  Chief Complaint  Patient presents with   Respiratory Distress    S-Pt presented on  05/30/2022 with  Chief Complaint  Patient presents with   Respiratory Distress  .  Patient is a 56 year old African-American female with a past medical history of end-stage renal disease, patient is on hemodialysis on Tuesday Thursday Saturday schedule, hypertension, hyperlipidemia, diabetes mellitus, CVA, history of bilateral BKA, peripheral vascular disease, atrial fibrillation-on anticoagulants, diastolic CHF, anemia who came to the ER with chief complaint of shortness of breath.  History of present illness dates back to this morning when patient started feeling short of breath was progressively getting worse so she came to the ER.  Upon evaluation in the ER patient was found to be hypoxic, in severe respiratory distress and was started on nonrebreather.  Patient also found to be hypotensive and was given normal saline bolus.Nephrology was consulted for comanagement of dialysis patient.  Patient was seen in the ER. Patient main concern continues to be shortness of breath. Patient informed me that she started feeling short of breath from past few days and was getting progressively worse it was hard for her to do her activities of daily living the next reason she came to the ER. Patient does not give any history of nausea/vomiting/diarrhea. Patient is well-known to our practice as patient sees Dr. Holley Raring as an outpatient  Medications   apixaban  5 mg Oral BID   [START ON 05/31/2022] Chlorhexidine Gluconate Cloth  6 each Topical Q0600   insulin aspart  0-5 Units Subcutaneous QHS   insulin aspart  0-9 Units Subcutaneous TID WC   midodrine  10 mg Oral TID WC         XBD:[id number] from the symptoms mentioned above,there are no other symptoms  referable to all systems reviewed.  Physical Exam: Vital signs in last 24 hours: Temp:  [97.2 F (36.2 C)-98.8 F (37.1 C)] 98.6 F (37 C) (10/17 1600) Pulse Rate:  [41-129] 64 (10/17 1800) Resp:  [13-34] 27 (10/17 1800) BP: (84-129)/(49-84) 129/72 (10/17 1800) SpO2:  [85 %-100 %] 100 % (10/17 1800) Weight:  [99.8 kg] 99.8 kg (10/17 0749) Weight change:     Intake/Output from previous day: No intake/output data recorded. No intake/output data recorded.   Physical Exam:  General- pt is awake,alert, oriented to time place and person  HEENT-sclera is icteric, conjunctive  is pale, head is atraumatic normocephalic  Resp- Moderate REsp distress, Decreased breath sounds at bases  CVS- S1S2 irregular in rate and rhythm  GIT- BS+, soft, Non tender , Non distended  EXT- Bilateral BKA,  No Cyanosis  Access- Left upper extremity AV fistula present  Lab Results:  CBC  Recent Labs    05/30/22 0813  WBC 8.9  HGB 8.4*  HCT 28.4*  PLT 213    BMET  Recent Labs    05/28/22 0118 05/30/22 0813  NA 138 139  K 3.0*  3.1* 4.3  CL 94* 96*  CO2 27 21*  GLUCOSE 123* 225*  BUN 9 32*  CREATININE 4.56* 8.24*  CALCIUM 8.4* 9.8      Most recent Creatinine trend  Lab Results  Component Value Date   CREATININE 8.24 (H) 05/30/2022   CREATININE 4.56 (H) 05/28/2022   CREATININE 3.70 (H) 05/27/2022      MICRO   Recent Results (from the past 240 hour(s))  Resp Panel by RT-PCR (Flu A&B, Covid) Anterior Nasal Swab     Status: None   Collection Time: 05/30/22  8:13 AM   Specimen: Anterior Nasal Swab  Result Value Ref Range Status   SARS Coronavirus 2 by RT PCR NEGATIVE NEGATIVE Final    Comment: (NOTE) SARS-CoV-2 target nucleic acids are NOT DETECTED.  The SARS-CoV-2 RNA is generally detectable in upper respiratory specimens during the acute phase of infection. The lowest concentration of SARS-CoV-2 viral copies this assay can detect is 138 copies/mL. A negative  result does not preclude SARS-Cov-2 infection and should not be used as the sole basis for treatment or other patient management decisions. A negative result may occur with  improper specimen collection/handling, submission of specimen other than nasopharyngeal swab, presence of viral mutation(s) within the areas targeted by this assay, and inadequate number of viral copies(<138 copies/mL). A negative result must be combined with clinical observations, patient history, and epidemiological information. The expected result is Negative.  Fact Sheet for Patients:  EntrepreneurPulse.com.au  Fact Sheet for Healthcare Providers:  IncredibleEmployment.be  This test is no t yet approved or cleared by the [place] FDA and  has been authorized for detection and/or diagnosis of SARS-CoV-2 by FDA under an Emergency Use Authorization (EUA). This EUA will remain  in effect (meaning this test can be used) for the duration of the COVID-19 declaration under Section 564(b)(1) of the Act, 21 U.S.C.section 360bbb-3(b)(1), unless the authorization is terminated  or revoked sooner.       Influenza A by PCR NEGATIVE NEGATIVE Final   Influenza B by PCR NEGATIVE NEGATIVE Final    Comment: (NOTE) The Xpert Xpress SARS-CoV-2/FLU/RSV plus assay is intended as an aid in the diagnosis of influenza from Nasopharyngeal swab specimens and should not be used as a sole basis for treatment. Nasal washings and aspirates are unacceptable for Xpert Xpress SARS-CoV-2/FLU/RSV testing.  Fact Sheet for Patients: EntrepreneurPulse.com.au  Fact Sheet for Healthcare Providers: IncredibleEmployment.be  This test is not yet approved or cleared by the [place] FDA and has been authorized for detection and/or diagnosis of SARS-CoV-2 by FDA under an Emergency Use Authorization (EUA). This EUA will remain in effect (meaning this test can be used)  for the duration of the COVID-19 declaration under Section 564(b)(1) of the Act, 21 U.S.C. section 360bbb-3(b)(1), unless the authorization is terminated or revoked.  Performed at [facility], [address] [place], [place] [address]          Impression:   1)Renal    End-stage renal disease Patient is on hemodialysis Patient is on Tuesday Thursday Saturday schedule Patient will be dialyzed today Will not aim for for any fluid removal as patient is in septic shock and has required 3 L of bolus   2)Hypotension Patient was admitted with septic shock. Patient received 3 L of bolus Patient is currently not requiring vasopressor   3)Anemia of chronic disease     Latest Ref Rng & Units 05/30/2022    8:13 AM 05/27/2022    4:57 PM 05/27/2022    4:49 PM  CBC  WBC 4.0 - 10.5 K/uL 8.9   8.5   Hemoglobin 12.0 - 15.0 g/dL 8.4  8.5  7.6   Hematocrit 36.0 - 46.0 % 28.4  25.0  25.2   Platelets 150 - 400 K/uL 213   163        HGb is not at goal (9--11) Patient is on Mircera as an outpatient  4) Secondary  hyperparathyroidism -CKD Mineral-Bone Disorder    Lab Results  Component Value Date   CALCIUM 9.8 05/30/2022   CAION 0.90 (L) 05/27/2022   PHOS 2.7 05/28/2022    Patient has history of secondary parathyroidism Patient intact PTH level was at 584 as an outpatient Patient is on Hectorol and Sensipar  Phosphorus at goal.   5)Healthcare associated pneumonia/severe sepsis/septic shock Patient is on broad-spectrum antibiotics Patient is being followed by primary team, ICU team   6) Electrolytes      Latest Ref Rng & Units 05/30/2022    8:13 AM 05/28/2022    1:18 AM 05/27/2022    4:57 PM  BMP  Glucose 70 - 99 mg/dL 225  123  111   BUN 6 - 20 mg/dL 32  9  6   Creatinine 0.44 - 1.00 mg/dL 8.24  4.56  3.70   Sodium 135 - 145 mmol/L 139  138  137   Potassium 3.5 - 5.1 mmol/L 4.3  3.0    3.1  2.7   Chloride 98 - 111 mmol/L 96  94  94   CO2 22 -  32 mmol/L 21  27    Calcium 8.9 - 10.3 mg/dL 9.8  8.4       Sodium Normonatremic   Potassium Normokalemic    7)Acid base   Co2 at goal  8)Pleural effusion Patient CT chest had shown moderate size complicated right pleural effusion Patient may need tap Patient is being followed by pulmonary team and primary team  9)Liver failure Patient total bilirubin is elevated at 6.7 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 No evidence of biliary obstruction. No intrahepatic biliary duct dilatation. The common bile duct normal caliber. 2. 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 Query blood transfusion history. 3. Pancreas appears normal. Small periampullary duodenum diverticulum. 4. Septated RIGHT pleural effusion with dense RIGHT lower lobe atelectasis. Primary team and ICU team is following   10)History of CVA Patient has history of CVA with residual left-sided weakness Patient is clinically stable   11)Atrial fibrillation Patient has history of atrial fibrillation Patient is on Eliquis   Plan:   Patient is being admitted with liver failure, complicated right pleural effusion, healthcare associate pneumonia, severe sepsis, septic shock, end-stage renal disease.   Initially the thought process was that patient is in fluid overload and is short of breath.  Upon the data review.  Patient shortness of breath is most likely from loculated pleural effusion.  Patient required 3 L of fluid for blood pressure to stabilize.Will not aim for any fluid removal.We will continue to follow closely   I spent   30      minutes of critical care time. Critical care time was spent personally by me for these activities-development of treatment plan with the patient/patient family, discussion with the patient's primary team, discussion with the patient ICU team,  evaluation of patient response to treatment/CRRT, examination of patient, ordering and performing treatments intervention, ordering and reviewing the lab data, and continuing evaluation of patient's condition.     Graciella Arment s Theador Hawthorne 05/30/2022, 6:19 PM

## 2022-05-30 NOTE — Assessment & Plan Note (Signed)
-  see above 

## 2022-05-30 NOTE — ED Notes (Signed)
Visitors and dialysis RN remain at bedside; pt updated/delay on admit room explained; pt udnerstanding; pt denies any current needs.

## 2022-05-30 NOTE — Assessment & Plan Note (Signed)
Recent A1c 6.0, well controlled.  Patient is taking Tradjenta at home. -Slight scale insulin

## 2022-05-30 NOTE — Progress Notes (Signed)
PHARMACY -  BRIEF ANTIBIOTIC NOTE   Pharmacy has received consult(s) for Vancomycin and Cefepime from an ED provider.  The patient's profile has been reviewed for ht/wt/allergies/indication/available labs.    One time order(s) placed by MD for Cefepime 2 gm and Vancomycin 1 gram  Further antibiotics/pharmacy consults should be ordered by admitting physician if indicated.                       Thank you, Nataliah Hatlestad A 05/30/2022  8:19 AM

## 2022-05-30 NOTE — ED Triage Notes (Signed)
Patient to ED via [facility] from home for resp distress and weakness. Patient does receive dialysis- last treatment on Saturday, suppose to receive dialysis today.

## 2022-05-30 NOTE — Assessment & Plan Note (Signed)
  BMI= 32.49  and BW= 99.8 -Diet and exercise.   -Encouraged to lose weight.

## 2022-05-30 NOTE — ED Notes (Signed)
Patient at CT scan.

## 2022-05-30 NOTE — Progress Notes (Signed)
CODE SEPSIS - PHARMACY COMMUNICATION  **Broad Spectrum Antibiotics should be administered within 1 hour of Sepsis diagnosis**  Time Code Sepsis Called/Page Received: 0815  Antibiotics Ordered: cefepime/vanc  Time of 1st antibiotic administration: 0827  Additional action taken by pharmacy:    If necessary, Name of Provider/Nurse Contacted:      Noralee Space ,PharmD Clinical Pharmacist  05/30/2022  8:47 AM

## 2022-05-30 NOTE — Progress Notes (Signed)
Pt did 3hrs 32min of HD. Bp low, had to stop UF  UF = 0  Access working fine.   05/30/22 1950  Vitals  Temp (!) 97.5 F (36.4 C)  Temp Source Oral  BP 111/61  MAP (mmHg) 77  BP Location Right Arm  BP Method Automatic  Patient Position (if appropriate) Lying  Pulse Rate 69  Pulse Rate Source Monitor  ECG Heart Rate 69  Resp 15  Oxygen Therapy  SpO2 100 %  O2 Device Nasal Cannula  O2 Flow Rate (L/min) 6 L/min  Patient Activity (if Appropriate) In bed  Pulse Oximetry Type Continuous  Post Treatment  Dialyzer Clearance Clear  Duration of HD Treatment -hour(s) 3.25 hour(s)  Hemodialysis Intake (mL) 0 mL  Liters Processed 78  Fluid Removed 0 mL  Tolerated HD Treatment Yes  Post-Hemodialysis Comments bp low , had to stop fluid removal  AVG/AVF Arterial Site Held (minutes) 10 minutes  AVG/AVF Venous Site Held (minutes) 10 minutes

## 2022-05-30 NOTE — Progress Notes (Signed)
Patient brought in by EMS on NRB. Unable to obtain O2 sat. MD at bedside. RT not needed at this time.

## 2022-05-30 NOTE — Consult Note (Signed)
Pharmacy Antibiotic Note  Cindy Osborne is a 56 y.o. female admitted on 05/30/2022 with  shortness of breath . PMH significant for HTN, HLD, bilateral BKA, DM< ESRD on HD TuThSa. Patient was recently admitted with N/V due to suspected viral illness (received supportive care, discharged 10/15). In the ED, patient found to have acute respiratory failure requiring nonrebreather due to possible HCAP and right moderate pleural effusion. PCT 0.43. Pharmacy has been consulted for Vancomycin and Cefepime dosing.  Plan: Day 1 of antibiotics Patient received Vancomycin 1000 mg IV x1 in ED Initiate Vancomycin 1000 mg IV after HD on TuThSa. Goal level 15-25 mcg/mL. Initiate Cefepime 1 g IV Q24H Continue to monitor for clinical improvement and follow culture results   Height: 5\' 9"  (175.3 cm) Weight: 99.8 kg (220 lb) IBW/kg (Calculated) : 66.2  Temp (24hrs), Avg:98 F (36.7 C), Min:97.2 F (36.2 C), Max:98.8 F (37.1 C)  Recent Labs  Lab 05/27/22 1649 05/27/22 1657 05/28/22 0118 05/30/22 0813 05/30/22 1157  WBC 8.5  --   --  8.9  --   CREATININE 3.75* 3.70* 4.56* 8.24*  --   LATICACIDVEN  --   --   --  5.9*  8.4* 3.7*    Estimated Creatinine Clearance: 9.5 mL/min (A) (by C-G formula based on SCr of 8.24 mg/dL (H)).    Allergies  Allergen Reactions   Augmentin [Amoxicillin-Pot Clavulanate] Shortness Of Breath   Atorvastatin Other (See Comments)    Back pain   Pioglitazone Other (See Comments)    Edema   Peanut-Containing Drug Products Itching   Rosuvastatin Itching and Other (See Comments)    Back pain   Shellfish-Derived Products Itching and Rash    shrimp   Sulfa Antibiotics Rash and Other (See Comments)    Shedding of skin    Antimicrobials this admission: 10/17 Metronidazole x1 10/17 Vancomycin >>  10/17 Cefepime >>  Microbiology results: 10/17 BCx: IP 10/17 UCx: ordered  10/17 Sputum: ordered  10/17 MRSA PCR: ordered  Thank you for allowing pharmacy to be a  part of this patient's care.  Gretel Acre, PharmD PGY1 Pharmacy Resident 05/30/2022 2:59 PM

## 2022-05-30 NOTE — ED Notes (Signed)
Lights dimmed for pt as stated would like to try and go to sleep. Blinds of door open with pt's approval.

## 2022-05-30 NOTE — H&P (Signed)
History and Physical    Cindy Osborne FVC:[id number] DOB: 04/09/1966 DOA: 05/30/2022  Referring MD/NP/PA:   PCP: Glean Hess, MD   Patient coming from:  The patient is coming from home.  At baseline, pt is independent for most of ADL.        Chief Complaint: SOB  HPI: Cindy Osborne is a 56 y.o. female with medical history significant of ESRD-HD (TTS), HTN, HLD, DM, stroke, s/p of bilateral BKA, PVD, PAF on Eliquis, dCHF, anemia, who presents with SOB.  Patient states that she started having shortness of breath within this morning, which has been progressively worsening.  It is associated with dry cough.  No chest pain, fever or chills.  Patient was recently hospitalized from 10/14 - 10/15 due to nausea and vomiting.  This issue had resolved.  Currently patient denies nausea, vomiting, diarrhea or abdominal pain.  No symptoms of UTI.  Patient had dialysis on Saturday.   Patient is not using oxygen normally, but was found to have severe respiratory distress, oxygen desaturation to 85% on room air, cannot speak in full sentence, nonrebreather is started in ED. Initially pt was hypotensive with blood pressure 85/74, which improved to SBP 100s after giving 3.0 L normal saline bolus  Data reviewed independently and ED Course: pt was found to have WBC 8.9, INR 2.8, PTT 41, negative COVID PCR, abnormal liver function (ALP 175, AST 720, ALT 407, total bilirubin 6.7). ABG with pH 7.49, CO2 31, O2 221. Procalcitonin 0.43 lactic acid 8.4 --. 5.9 --> 3.7.  Potassium of 4.3, bicarbonate of 21, creatinine 8.24, BUN 32, magnesium 2.0.  Chest x-ray showed cardiomegaly, vascular congestion and moderate right pleural effusion.  Patient is admitted to stepdown as inpatient.  Dr. Mortimer Fries of ICU and  Dr. Theador Hawthorne of nephrology are consulted.  CT-chest/abdomen/pelvis 1. Moderate size complicated right pleural effusion with associated pleural thickening and possible pleural nodularity. The size of  this effusion is similar to available prior chest radiographs, such that empyema is unlikely. A malignant effusion cannot be excluded, and there is partial collapse of the right middle and lower lobes with mildly prominent right hilar and mediastinal lymph nodes. If not previously performed, recommend further evaluation with thoracentesis. 2. Patchy ground-glass opacities within the aerated portions of both lungs, nonspecific, but potentially inflammatory. 3. No acute findings in the abdomen or pelvis. 4. Scattered prominent retroperitoneal lymph nodes, nonspecific, but potentially reactive. 5. Chronic renal failure with limited renal parenchymal enhancement and excretion. 6. IUD in place. 7.  Aortic Atherosclerosis (ICD10-I70.0).  MRCP: 1. No evidence of biliary obstruction. No intrahepatic biliary duct dilatation. The common bile duct normal caliber. 2. 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 Query blood transfusion history. 3. Pancreas appears normal. Small periampullary duodenum diverticulum. 4. Septated RIGHT pleural effusion with dense RIGHT lower lobe atelectasis.  EKG: I have personally reviewed.  Sinus rhythm, QTc 506, poor R wave progression, nonspecific T wave change   Review of Systems:   General: no fevers, chills, no body weight gain, has fatigue HEENT: no blurry vision, hearing changes or sore throat Respiratory: has dyspnea, coughing, no wheezing CV: no chest pain, no palpitations GI: no nausea, vomiting, abdominal pain, diarrhea, constipation GU: no dysuria, burning on urination, increased urinary frequency, hematuria  Ext: no leg edema. S/p of bilateral BKA Neuro: no unilateral weakness, numbness, or tingling, no vision change or hearing loss Skin: no rash, no skin tear. MSK: No muscle spasm, no deformity, no  limitation of range of movement in spin Heme: No easy bruising.  Travel history: No recent long  distant travel.   Allergy:  Allergies  Allergen Reactions   Augmentin [Amoxicillin-Pot Clavulanate] Shortness Of Breath   Atorvastatin Other (See Comments)    Back pain   Pioglitazone Other (See Comments)    Edema   Peanut-Containing Drug Products Itching   Rosuvastatin Itching and Other (See Comments)    Back pain   Shellfish-Derived Products Itching and Rash    shrimp   Sulfa Antibiotics Rash and Other (See Comments)    Shedding of skin    Past Medical History:  Diagnosis Date   Acute ischemic right MCA stroke ([place]) 03/25/2022   Amputated toe ([place]) 01/31/2012   Right second toe distal phalanx Right great toe   Diabetes mellitus without complication ([place])    History of diabetic ulcer of foot 04/01/2014   Hyperlipidemia    Hypertension    Stroke (cerebrum) ([place]) 03/21/2022   Stroke ([place]) 01/21/2022    Past Surgical History:  Procedure Laterality Date   angioplasty politeal Left 07/2020   avg for dialysis     CHOLECYSTECTOMY     COLONOSCOPY  08/15/2011   cleared for 10 yrs   IR CT HEAD LTD  01/21/2022   IR CT HEAD LTD  03/21/2022   IR PERCUTANEOUS ART THROMBECTOMY/INFUSION INTRACRANIAL INC DIAG ANGIO  01/21/2022   IR PERCUTANEOUS ART THROMBECTOMY/INFUSION INTRACRANIAL INC DIAG ANGIO  03/21/2022   IR US GUIDE VASC ACCESS RIGHT  01/21/2022   IR US GUIDE VASC ACCESS RIGHT  03/21/2022   LEG AMPUTATION BELOW KNEE Right 04/2019   LEG AMPUTATION BELOW KNEE Left 09/08/2020   RADIOLOGY WITH ANESTHESIA N/A 01/21/2022   Procedure: IR WITH ANESTHESIA;  Surgeon: Radiologist, Medication, MD;  Location: [place] [facility];  Service: Radiology;  Laterality: N/A;   RADIOLOGY WITH ANESTHESIA N/A 03/21/2022   Procedure: IR WITH ANESTHESIA;  Surgeon: Radiologist, Medication, MD;  Location: [facility];  Service: Radiology;  Laterality: N/A;   TOE AMPUTATION Right 10/15/2018    Social History:  reports that she has never smoked. She has never used smokeless tobacco. She reports current alcohol use. She reports that  she does not use drugs.  Family History:  Family History  Problem Relation Age of Onset   Diabetes Mother    Diabetes Sister      Prior to Admission medications   Medication Sig Start Date End Date Taking? Authorizing Provider  acetaminophen (TYLENOL) 325 MG tablet Take 1-2 tablets (325-650 mg total) by mouth every 4 (four) hours as needed for mild pain. 03/27/22   Love, Ivan Anchors, PA-C  apixaban (ELIQUIS) 5 MG TABS tablet Take 1 tablet (5 mg total) by mouth 2 (two) times daily. 03/25/22   Bailey-Modzik, Mayo Ao A, NP  cinacalcet (SENSIPAR) 30 MG tablet Take 3 tablets (90 mg total) by mouth Every Tuesday,Thursday,and Saturday with dialysis. 03/25/22   Bailey-Modzik, Delila A, NP  linagliptin (TRADJENTA) 5 MG TABS tablet Take 1 tablet (5 mg total) by mouth daily. 04/11/22   Love, Ivan Anchors, PA-C  liver oil-zinc oxide (DESITIN) 40 % ointment Apply topically 2 (two) times daily. To buttocks Patient not taking: Reported on 05/27/2022 04/10/22   Love, Ivan Anchors, PA-C  metoprolol (TOPROL-XL) 200 MG 24 hr tablet Take 1 tablet (200 mg total) by mouth daily. Take with or immediately following a meal. 01/25/22   August Albino, NP  pravastatin (PRAVACHOL) 40 MG tablet Take 1 tablet (40 mg total) by  mouth daily at 6 PM. 06/02/22   Glean Hess, MD    Physical Exam: Vitals:   05/30/22 1326 05/30/22 1330 05/30/22 1345 05/30/22 1400  BP: (!) 92/58 99/72 101/74 115/76  Pulse: 66 63  65  Resp: (!) 22 (!) 28 (!) 24 (!) 26  Temp:      TempSrc:      SpO2: 92% 92%  92%  Weight:      Height:       General: in acute respiratory distress HEENT:       Eyes: PERRL, EOMI, no scleral icterus.       ENT: No discharge from the ears and nose, no pharynx injection, no tonsillar enlargement.        Neck: No JVD, no bruit, no mass felt. Heme: No neck lymph node enlargement. Cardiac: S1/S2, RRR, No murmurs, No gallops or rubs. Respiratory: Has decreased air movement on the right side GI: Soft, nondistended,  nontender, no rebound pain, no organomegaly, BS present. GU: No hematuria Ext: No pitting leg edema bilaterally. S/p of bilateral BKA Musculoskeletal: No joint deformities, No joint redness or warmth, no limitation of ROM in spin. Skin: No rashes.  Neuro: Alert, oriented X3, cranial nerves II-XII grossly intact, moves all extremities Psych: Patient is not psychotic, no suicidal or hemocidal ideation.  Labs on Admission: I have personally reviewed following labs and imaging studies  CBC: Recent Labs  Lab 05/27/22 1649 05/27/22 1657 05/30/22 0813  WBC 8.5  --  8.9  NEUTROABS  --   --  7.7  HGB 7.6* 8.5* 8.4*  HCT 25.2* 25.0* 28.4*  MCV 94.7  --  96.9  PLT 163  --  782   Basic Metabolic Panel: Recent Labs  Lab 05/27/22 1649 05/27/22 1657 05/28/22 0118 05/30/22 0813  NA 139 137 138 139  K 2.6* 2.7* 3.0*  3.1* 4.3  CL 95* 94* 94* 96*  CO2 25  --  27 21*  GLUCOSE 108* 111* 123* 225*  BUN 6 6 9  32*  CREATININE 3.75* 3.70* 4.56* 8.24*  CALCIUM 8.7*  --  8.4* 9.8  MG  --   --  2.0  --   PHOS  --   --  2.7  --    GFR: Estimated Creatinine Clearance: 9.5 mL/min (A) (by C-G formula based on SCr of 8.24 mg/dL (H)). Liver Function Tests: Recent Labs  Lab 05/27/22 1649 05/28/22 0118 05/30/22 0813  AST 25 30 720*  ALT 16 21 407*  ALKPHOS 114 107 175*  BILITOT 2.4* 2.8* 6.7*  PROT 7.7 6.8 7.8  ALBUMIN 3.3* 3.1* 3.5   Recent Labs  Lab 05/27/22 1649 05/30/22 0813  LIPASE 26 33   No results for input(s): "AMMONIA" in the last 168 hours. Coagulation Profile: Recent Labs  Lab 05/30/22 0813  INR 2.8*   Cardiac Enzymes: No results for input(s): "CKTOTAL", "CKMB", "CKMBINDEX", "TROPONINI" in the last 168 hours. BNP (last 3 results) No results for input(s): "PROBNP" in the last 8760 hours. HbA1C: No results for input(s): "HGBA1C" in the last 72 hours. CBG: No results for input(s): "GLUCAP" in the last 168 hours. Lipid Profile: No results for input(s): "CHOL",  "HDL", "LDLCALC", "TRIG", "CHOLHDL", "LDLDIRECT" in the last 72 hours. Thyroid Function Tests: Recent Labs    05/28/22 0118  TSH 2.583   Anemia Panel: Recent Labs    05/28/22 0118  FERRITIN 3,083*  TIBC 188*  IRON 85   Urine analysis:    Component Value Date/Time  BILIRUBINUR neg 06/09/2021 1128   PROTEINUR Positive (A) 06/09/2021 1128   UROBILINOGEN 0.2 06/09/2021 1128   NITRITE neg' 06/09/2021 1128   LEUKOCYTESUR Large (3+) (A) 06/09/2021 1128   Sepsis Labs: @LABRCNTIP (procalcitonin:4,lacticidven:4) ) Recent Results (from the past 240 hour(s))  Resp Panel by RT-PCR (Flu A&B, Covid) Anterior Nasal Swab     Status: None   Collection Time: 05/30/22  8:13 AM   Specimen: Anterior Nasal Swab  Result Value Ref Range Status   SARS Coronavirus 2 by RT PCR NEGATIVE NEGATIVE Final    Comment: (NOTE) SARS-CoV-2 target nucleic acids are NOT DETECTED.  The SARS-CoV-2 RNA is generally detectable in upper respiratory specimens during the acute phase of infection. The lowest concentration of SARS-CoV-2 viral copies this assay can detect is 138 copies/mL. A negative result does not preclude SARS-Cov-2 infection and should not be used as the sole basis for treatment or other patient management decisions. A negative result may occur with  improper specimen collection/handling, submission of specimen other than nasopharyngeal swab, presence of viral mutation(s) within the areas targeted by this assay, and inadequate number of viral copies(<138 copies/mL). A negative result must be combined with clinical observations, patient history, and epidemiological information. The expected result is Negative.  Fact Sheet for Patients:  EntrepreneurPulse.com.au  Fact Sheet for Healthcare Providers:  IncredibleEmployment.be  This test is no t yet approved or cleared by the [place] FDA and  has been authorized for detection and/or diagnosis of  SARS-CoV-2 by FDA under an Emergency Use Authorization (EUA). This EUA will remain  in effect (meaning this test can be used) for the duration of the COVID-19 declaration under Section 564(b)(1) of the Act, 21 U.S.C.section 360bbb-3(b)(1), unless the authorization is terminated  or revoked sooner.       Influenza A by PCR NEGATIVE NEGATIVE Final   Influenza B by PCR NEGATIVE NEGATIVE Final    Comment: (NOTE) The Xpert Xpress SARS-CoV-2/FLU/RSV plus assay is intended as an aid in the diagnosis of influenza from Nasopharyngeal swab specimens and should not be used as a sole basis for treatment. Nasal washings and aspirates are unacceptable for Xpert Xpress SARS-CoV-2/FLU/RSV testing.  Fact Sheet for Patients: EntrepreneurPulse.com.au  Fact Sheet for Healthcare Providers: IncredibleEmployment.be  This test is not yet approved or cleared by the [place] FDA and has been authorized for detection and/or diagnosis of SARS-CoV-2 by FDA under an Emergency Use Authorization (EUA). This EUA will remain in effect (meaning this test can be used) for the duration of the COVID-19 declaration under Section 564(b)(1) of the Act, 21 U.S.C. section 360bbb-3(b)(1), unless the authorization is terminated or revoked.  Performed at [facility], [address], [place], [place] [address]      Radiological Exams on Admission: MR ABDOMEN MRCP WO CONTRAST  Result Date: 05/30/2022 CLINICAL DATA:  Respiratory distress and weakness. Jaundice. Elevated liver enzymes EXAM: MRI ABDOMEN WITHOUT CONTRAST  (INCLUDING MRCP) TECHNIQUE: Multiplanar multisequence MR imaging of the abdomen was performed. Heavily T2-weighted images of the biliary and pancreatic ducts were obtained, and three-dimensional MRCP images were rendered by post processing. COMPARISON:  CT 05/30/2022 FINDINGS: Lower chest: Large septated RIGHT pleural effusion with dense atelectasis of the  RIGHT lower lobe. Hepatobiliary: There is no intrahepatic biliary duct dilatation. There is loss of signal intensity on T2 weighted imaging within liver suggesting iron deposition. This is also represented as loss of signal intensity on inphase imaging (series 6). There is no intrahepatic biliary duct dilatation. The common bile duct  is normal caliber. Postcholecystectomy. Pancreas: Normal pancreatic parenchymal intensity. No ductal dilatation or inflammation. Spleen: Low signal intensity within the spleen. Adrenals/urinary tract: Adrenal glands normal. No renal obstruction. Multiple small benign appearing renal cysts. Stomach/Bowel: Stomach is normal. Periampullary duodenal diverticulum measures 1.5 cm. Limited view of the bowel is unremarkable. Vascular/Lymphatic: Abdominal aortic normal caliber. No retroperitoneal periportal lymphadenopathy. Musculoskeletal: No aggressive osseous lesion IMPRESSION: 1. No evidence of biliary obstruction. No intrahepatic biliary duct dilatation. The common bile duct normal caliber. 2. 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 Query blood transfusion history. 3. Pancreas appears normal. Small periampullary duodenum diverticulum. 4. Septated RIGHT pleural effusion with dense RIGHT lower lobe atelectasis. Electronically Signed   By: Suzy Bouchard M.D.   On: 05/30/2022 13:40   MR 3D Recon At Scanner  Result Date: 05/30/2022 CLINICAL DATA:  Respiratory distress and weakness. Jaundice. Elevated liver enzymes EXAM: MRI ABDOMEN WITHOUT CONTRAST  (INCLUDING MRCP) TECHNIQUE: Multiplanar multisequence MR imaging of the abdomen was performed. Heavily T2-weighted images of the biliary and pancreatic ducts were obtained, and three-dimensional MRCP images were rendered by post processing. COMPARISON:  CT 05/30/2022 FINDINGS: Lower chest: Large septated RIGHT pleural effusion with dense atelectasis of the RIGHT lower lobe. Hepatobiliary: There  is no intrahepatic biliary duct dilatation. There is loss of signal intensity on T2 weighted imaging within liver suggesting iron deposition. This is also represented as loss of signal intensity on inphase imaging (series 6). There is no intrahepatic biliary duct dilatation. The common bile duct is normal caliber. Postcholecystectomy. Pancreas: Normal pancreatic parenchymal intensity. No ductal dilatation or inflammation. Spleen: Low signal intensity within the spleen. Adrenals/urinary tract: Adrenal glands normal. No renal obstruction. Multiple small benign appearing renal cysts. Stomach/Bowel: Stomach is normal. Periampullary duodenal diverticulum measures 1.5 cm. Limited view of the bowel is unremarkable. Vascular/Lymphatic: Abdominal aortic normal caliber. No retroperitoneal periportal lymphadenopathy. Musculoskeletal: No aggressive osseous lesion IMPRESSION: 1. No evidence of biliary obstruction. No intrahepatic biliary duct dilatation. The common bile duct normal caliber. 2. 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 Query blood transfusion history. 3. Pancreas appears normal. Small periampullary duodenum diverticulum. 4. Septated RIGHT pleural effusion with dense RIGHT lower lobe atelectasis. Electronically Signed   By: Suzy Bouchard M.D.   On: 05/30/2022 13:40   CT CHEST ABDOMEN PELVIS W CONTRAST  Addendum Date: 05/30/2022   ADDENDUM REPORT: 05/30/2022 12:22 ADDENDUM: 1.9 cm left thyroid nodule. As previously recommended, unless already performed elsewhere, thyroid ultrasound recommended.(Ref: J Am Coll Radiol. 2015 Feb;12(2): 143-50). Electronically Signed   By: Richardean Sale M.D.   On: 05/30/2022 12:22   Result Date: 05/30/2022 CLINICAL DATA:  Sepsis. Respiratory distress and weakness. Hemodialysis patient. EXAM: CT CHEST, ABDOMEN, AND PELVIS WITH CONTRAST TECHNIQUE: Multidetector CT imaging of the chest, abdomen and pelvis was performed following  the standard protocol during bolus administration of intravenous contrast. RADIATION DOSE REDUCTION: This exam was performed according to the departmental dose-optimization program which includes automated exposure control, adjustment of the mA and/or kV according to patient size and/or use of iterative reconstruction technique. CONTRAST:  167mL OMNIPAQUE IOHEXOL 300 MG/ML  SOLN COMPARISON:  Chest radiograph same date and 05/27/2022. CTA neck 03/21/2022. No other prior relevant CT. FINDINGS: CT CHEST FINDINGS Cardiovascular: No acute vascular findings are seen. There is diffuse atherosclerosis of the aorta, great vessels and coronary arteries. Calcifications of the aortic valve are noted. The heart is mildly enlarged. No significant pericardial fluid. Mediastinum/Nodes: Small right mediastinal  and right hilar lymph nodes are noted, measuring up to 1.2 cm in the right paratracheal station on image 16/3. No other enlarged mediastinal, hilar or axillary lymph nodes. Low-density left thyroid nodule measuring 1.9 cm on image 6/3 is unchanged from previous neck CTA. The esophagus appears unremarkable. Lungs/Pleura: Moderate size complicated right pleural effusion appears loculated posterolaterally in the lower right hemithorax. There is associated pleural thickening and possible nodularity in the pleural space. Associated compressive atelectasis of the right lower and middle lobes without obvious endobronchial lesion or central pulmonary mass. There are patchy ground-glass opacities within the aerated portions of both lungs. Musculoskeletal/Chest wall: No chest wall mass or suspicious osseous findings. Mild multilevel thoracic spondylosis. CT ABDOMEN AND PELVIS FINDINGS Hepatobiliary: The liver is normal in density without suspicious focal abnormality. Status post cholecystectomy without evidence of significant biliary dilatation. Pancreas: Unremarkable. No pancreatic ductal dilatation or surrounding inflammatory changes.  Spleen: Normal in size without focal abnormality. Adrenals/Urinary Tract: No significant abnormality of the adrenal glands. Bilateral renal cortical thinning with limited parenchymal enhancement and excretion consistent with chronic renal failure. No evidence of urinary tract calculus or hydronephrosis. The bladder appears unremarkable for its degree of distention. Stomach/Bowel: No enteric contrast administered. The stomach appears unremarkable for its degree of distension. No evidence of bowel wall thickening, distention or surrounding inflammatory change. Incidental proximal duodenal diverticulum. The appendix appears normal. Mild sigmoid diverticulosis. Prominent stool in the rectum. Vascular/Lymphatic: Scattered prominent retroperitoneal lymph nodes, including a left periaortic node measuring 1.3 cm on image 86/3 and a left common iliac node measuring 0.9 cm on image 95/3. Diffuse aortic and branch vessel atherosclerosis without evidence of aneurysm or large vessel occlusion. Reproductive: Intrauterine device in place. There are small calcified uterine fibroids. No suspicious adnexal findings. Other: Small umbilical hernia containing only fat. No ascites or free air. Mild generalized soft tissue edema. Musculoskeletal: No acute or significant osseous findings. Multilevel lumbar spondylosis. Associated Schmorl's nodes, most prominent in the L1 and L4 vertebral bodies. No suspicious osseous findings. IMPRESSION: 1. Moderate size complicated right pleural effusion with associated pleural thickening and possible pleural nodularity. The size of this effusion is similar to available prior chest radiographs, such that empyema is unlikely. A malignant effusion cannot be excluded, and there is partial collapse of the right middle and lower lobes with mildly prominent right hilar and mediastinal lymph nodes. If not previously performed, recommend further evaluation with thoracentesis. 2. Patchy ground-glass opacities  within the aerated portions of both lungs, nonspecific, but potentially inflammatory. 3. No acute findings in the abdomen or pelvis. 4. Scattered prominent retroperitoneal lymph nodes, nonspecific, but potentially reactive. 5. Chronic renal failure with limited renal parenchymal enhancement and excretion. 6. IUD in place. 7.  Aortic Atherosclerosis (ICD10-I70.0). Electronically Signed: By: Richardean Sale M.D. On: 05/30/2022 11:38   DG Chest Port 1 View  Result Date: 05/30/2022 CLINICAL DATA:  Respiratory distress EXAM: PORTABLE CHEST 1 VIEW COMPARISON:  05/27/2022 FINDINGS: Cardiomegaly. Pulmonary vascular congestion. Moderate right sided pleural effusion with associated right basilar opacity. No pneumothorax. IMPRESSION: 1. Cardiomegaly with pulmonary vascular congestion. 2. Moderate right sided pleural effusion with associated right basilar opacity, similar to the previous study. Electronically Signed   By: Davina Poke D.O.   On: 05/30/2022 08:37      Assessment/Plan Principal Problem:   HCAP (healthcare-associated pneumonia) Active Problems:   Severe sepsis with septic shock (HCC)   Acute respiratory failure with hypoxia (HCC)   Pleural effusion on right   Abnormal LFTs  Anemia in ESRD (end-stage renal disease) ([place])   ESRD (end stage renal disease) on dialysis ([place])   Essential hypertension   History of stroke with current residual effects   HLD (hyperlipidemia)   PVD (peripheral vascular disease) (HCC)   Type II diabetes mellitus with renal manifestations (HCC)   Chronic diastolic CHF (congestive heart failure) (HCC)   Thyroid nodule   PAF (paroxysmal atrial fibrillation) (HCC)   Obesity with body mass index (BMI) of 30.0 to 39.9   Assessment and Plan: * HCAP (healthcare-associated pneumonia) Severe sepsis with septic shock and acute respiratory failure with hypoxia due to possible HCAP: Patient also has moderate right pleural effusion, which may have contributed partially  to her shortness breath.  Lactic acid 8.4, 5.9, 3.7.  Procalcitonin 0.43.  Initially hypotensive with blood pressure 85/74, which improved to SBP 100s, after giving 3 L normal saline bolus.  Patient is a dialysis patient, may not be able to give more IV fluid if blood pressure drops.  If blood pressure drops again, patient may need vasopressor.  Consulted Dr. Mortimer Fries of ICU.   - Will admit to SDU as inpt - IV Vancomycin and cefepime (patient received 1 dose of Flagyl in ED) - Mucinex for cough  - Bronchodilators - Urine S. pneumococcal antigen - Follow up blood culture x2, sputum culture - will trend lactic acid level per sepsis protocol.  - IVF: 3L of NS bolus in ED -Thoracentesis is ordered for right pleural effusion -Start midodrine 10 mg 3 times daily for hypotension       Severe sepsis with septic shock (HCC) -see above  Acute respiratory failure with hypoxia (HCC) -see above  Pleural effusion on right - Ordered thoracentesis -Follow-up pleural fluid analysis  Abnormal LFTs Likely due to shock liver.  MRCP is negative for biliary obstruction. -Avoid using Tylenol -Hepatitis panel -Hold pravastatin  Anemia in ESRD (end-stage renal disease) (HCC) Hemoglobin stable, 7.6 on 05/27/2022 --> 8.4 today -Follow-up with CBC  ESRD (end stage renal disease) on dialysis (HCC) - Consulted Dr. Theador Hawthorne of nephrology for dialysis  Essential hypertension - Hold blood pressure medications due to hypotension   History of stroke with current residual effects -Hold pravastatin due to abnormal liver function -Patient is on Eliquis for A-fib  HLD (hyperlipidemia) -Hold pravastatin due to abnormal liver function  PVD (peripheral vascular disease) ([place]) -S/p of bilateral BKA -fall precaution  Type II diabetes mellitus with renal manifestations ([place]) Recent A1c 6.0, well controlled.  Patient is taking Tradjenta at home. -Slight scale insulin  Chronic diastolic CHF (congestive  heart failure) ([place]) 2D echo on 01/22/2022 showed EF 55 to 60% with grade 2 diastolic dysfunction.  Chest x-ray showed cardiomegaly and vascular congestion. -Volume management per renal by dialysis  Thyroid nodule - Follow-up with PCP -Patient was scheduled for thyroid ultrasound as outpatient on 10/20  PAF (paroxysmal atrial fibrillation) (HCC) Heart rate 66 currently, heart rate was up to 129 earlier. - Hold oral metoprolol due to hypotension -As needed IV metoprolol 5 mg for heart rate > 125  Obesity with body mass index (BMI) of 30.0 to 39.9  BMI= 32.49  and BW= 99.8 -Diet and exercise.   -Encouraged to lose weight.           DVT ppx: on Eliquis  Code Status: Full code  Family Communication:  Yes, patient's mother and sister at bed side.      Disposition Plan:  Anticipate discharge back to previous environment  Consults called:  Dr. Mortimer Fries of ICU and  Dr. Theador Hawthorne of nephrology are consulted.  Admission status and Level of care: Stepdown:   SDU/inpation          Dispo: The patient is from: Home              Anticipated d/c is to: Home              Anticipated d/c date is: 2 days              Patient currently is not medically stable to d/c.    Severity of Illness:  The appropriate patient status for this patient is INPATIENT. Inpatient status is judged to be reasonable and necessary in order to provide the required intensity of service to ensure the patient's safety. The patient's presenting symptoms, physical exam findings, and initial radiographic and laboratory data in the context of their chronic comorbidities is felt to place them at high risk for further clinical deterioration. Furthermore, it is not anticipated that the patient will be medically stable for discharge from the hospital within 2 midnights of admission.   * I certify that at the point of admission it is my clinical judgment that the patient will require inpatient hospital care spanning beyond  2 midnights from the point of admission due to high intensity of service, high risk for further deterioration and high frequency of surveillance required.*       Date of Service 05/30/2022    Ivor [facility]   If 7PM-7AM, please contact night-coverage [email or web address] 05/30/2022, 3:51 PM

## 2022-05-30 NOTE — Assessment & Plan Note (Addendum)
-   Follow-up with PCP -Patient was scheduled for thyroid ultrasound as outpatient on 10/20

## 2022-05-30 NOTE — Assessment & Plan Note (Signed)
-  S/p of bilateral BKA -fall precaution

## 2022-05-30 NOTE — ED Notes (Signed)
Dialysis RN gave report to this RN. Dialysis RN stated did not remove any fluid per provider's order.

## 2022-05-30 NOTE — Assessment & Plan Note (Signed)
-   Ordered thoracentesis -Follow-up pleural fluid analysis

## 2022-05-30 NOTE — Assessment & Plan Note (Signed)
2D echo on 01/22/2022 showed EF 55 to 60% with grade 2 diastolic dysfunction.  Chest x-ray showed cardiomegaly and vascular congestion. -Volume management per renal by dialysis

## 2022-05-30 NOTE — Assessment & Plan Note (Addendum)
Severe sepsis with septic shock and acute respiratory failure with hypoxia due to possible HCAP: Patient also has moderate right pleural effusion, which may have contributed partially to her shortness breath.  Lactic acid 8.4, 5.9, 3.7.  Procalcitonin 0.43.  Initially hypotensive with blood pressure 85/74, which improved to SBP 100s, after giving 3 L normal saline bolus.  Patient is a dialysis patient, may not be able to give more IV fluid if blood pressure drops.  If blood pressure drops again, patient may need vasopressor.  Consulted Dr. Mortimer Fries of ICU.   - Will admit to SDU as inpt - IV Vancomycin and cefepime (patient received 1 dose of Flagyl in ED) - Mucinex for cough  - Bronchodilators - Urine S. pneumococcal antigen - Follow up blood culture x2, sputum culture - will trend lactic acid level per sepsis protocol.  - IVF: 3L of NS bolus in ED -Thoracentesis is ordered for right pleural effusion -Start midodrine 10 mg 3 times daily for hypotension

## 2022-05-30 NOTE — Consult Note (Signed)
NAME:  Cindy Osborne, MRN:  [id number], DOB:  06/17/1966, LOS: 0 ADMISSION DATE:  05/30/2022, CONSULTATION DATE:  05/30/2022 REFERRING MD:  Dr. Blaine Hamper, CHIEF COMPLAINT:  Acute Respiratory Distress, Hypotension   Brief Pt Description / Synopsis:  56 y.o. Female with PMH significant for ESRD on HD and HFpEF admitted with Hypotension (hypovolemic +/- developing sepsis) and Acute Hypoxic Respiratory Failure in the setting of suspected HCAP and right pleural effusion.  Also found to have Acute Liver Failure.  History of Present Illness:  Cindy Osborne is a 56 year old female with a past medical history significant for ESRD on hemodialysis (T, Th, S), HFpEF, PAF on Eliquis, hypertension, hyperlipidemia, diabetes mellitus, PVD, bilateral BKA's and anemia of chronic disease who presented to [facility] ED on 05/30/2022 due to complaints of shortness of breath.    She was in her usual state of health last night at bedtime, and this morning while putting on her clothes became very short of breath.  She also reports associated dry cough. She denies chest pain, palpitations, dizziness, syncope, fever, chills, dysuria, N/V/D, or abdominal pain.  She last received Hemodialysis on Saturday 05/27/22.  Upon presentation to the ED she was noted to have severe respiratory distress, hypoxic with O2 saturations of 85% on room air, of which she was subsequently placed on NRB mask.  She was initially hypotensive with BP 85/74 which improved following fluid resuscitation.  Of note she was recently hospitalized from 05/27/22 through 05/28/22 due to suspected viral illness with nausea and vomiting.  ED Course: Initial Vital Signs: Temperature 96.2 F axillary, blood pressure 86/55, pulse 72, respiratory rate 18, SPO2 85% on NRB Significant Labs: Bicarbonate 21, glucose 225, BUN 32, creatinine 8.24, alkaline phosphatase 175, AST 720, ALT 407, total bilirubin 6.7, lactic acid 8.4, procalcitonin 0.43, WBC 8.9, hemoglobin 8.4,  hematocrit 28.4, PT 29, INR 2.8 COVID-19 and influenza PCR negative ABG on nonrebreather: pH 749/PCO2 31/PO2 221/bicarb 23.3 Imaging Chest X-ray>>IMPRESSION: 1. Cardiomegaly with pulmonary vascular congestion. 2. Moderate right sided pleural effusion with associated right basilar opacity, similar to the previous study. CT Chest/Abdomen/Pelvis>>IMPRESSION: 1. Moderate size complicated right pleural effusion with associated pleural thickening and possible pleural nodularity. The size of this effusion is similar to available prior chest radiographs, such that empyema is unlikely. A malignant effusion cannot be excluded, and there is partial collapse of the right middle and lower lobes with mildly prominent right hilar and mediastinal lymph nodes. If not previously performed, recommend further evaluation with thoracentesis. 2. Patchy ground-glass opacities within the aerated portions of both lungs, nonspecific, but potentially inflammatory. 3. No acute findings in the abdomen or pelvis. 4. Scattered prominent retroperitoneal lymph nodes, nonspecific, but potentially reactive. 5. Chronic renal failure with limited renal parenchymal enhancement and excretion. 6. IUD in place. 7.  Aortic Atherosclerosis MRCP>>IMPRESSION: 1. No evidence of biliary obstruction. No intrahepatic biliary duct dilatation. The common bile duct normal caliber. 2. 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 Query blood transfusion history. 3. Pancreas appears normal. Small periampullary duodenum diverticulum. 4. Septated RIGHT pleural effusion with dense RIGHT lower lobe atelectasis. Medications Administered: Cefepime, Flagyl, Vancomycin, 3L NS boluses  Hospitalists were asked to admit the patient for further workup and treatment.    Please see "significant hospital events" section below for full detailed hospital course.   Pertinent  Medical History   Past  Medical History:  Diagnosis Date   Acute ischemic right MCA stroke ([place]) 03/25/2022   Amputated toe ([place]) 01/31/2012  Right second toe distal phalanx Right great toe   Diabetes mellitus without complication ([place])    History of diabetic ulcer of foot 04/01/2014   Hyperlipidemia    Hypertension    Stroke (cerebrum) ([place]) 03/21/2022   Stroke ([place]) 01/21/2022     Micro Data:  10/17: SARS-CoV-2 and influenza PCR>> negative 10/17: Blood culture x2>> 10/17: Urine>> 10/17: Sputum>> 10/17: Strep pneumo urinary antigen>> 10/17: Legionella urinary antigen>> 10/17: MRSA PCR>>  Antimicrobials:  Flagyl 10/17 x1 dose Cefepime 10/17>> Vancomycin 10/17>>  Interim History / Subjective:  -Patient presented to the ED earlier today with acute onset of shortness of breath -Initially found to be hypoxic and hypotensive, hypotension responsive to IV fluids -Has been weaned to nasal cannula 7 L  -Seen and evaluated in the ED, patient currently receiving hemodialysis -She reports her shortness of breath has improved since presentation, currently declines all complaints  Objective   Blood pressure 115/76, pulse 65, temperature 98.8 F (37.1 C), temperature source Rectal, resp. rate (!) 26, height 5\' 9"  (1.753 m), weight 99.8 kg, SpO2 92 %.       No intake or output data in the 24 hours ending 05/30/22 1504 Filed Weights   05/30/22 0749  Weight: 99.8 kg    Examination: General: Acute on chronically ill-appearing female, sitting in bed, on nasal cannula, in no acute distress HENT: Atraumatic, normocephalic, neck supple, difficult to assess JVD due to body habitus Lungs: Coarse breath sounds throughout, no wheezing, mild tachypnea, even Cardiovascular: Regular rate and rhythm, S1-S2, no murmurs, rubs, gallops Abdomen: Obese, soft, nontender, nondistended, no guarding rebound tenderness, bowel sounds positive x4 Extremities: Generalized weakness, bilateral BKA's  Neuro: Awake and alert, oriented  x4, moves all extremities to command, left side slightly weaker than right due to previous strokes, pupils PERRLA GU: Deferred  Resolved Hospital Problem list     Assessment & Plan:   Acute Hypoxic Respiratory Failure in the setting of suspected HCAP, Right Pleural Effusion & Pulmonary vascular congestion -Supplemental O2 as needed to maintain O2 sats >92% -BiPAP if needed -Follow intermittent Chest X-ray & ABG as needed -Bronchodilators as needed -ABX as above -IR consulted for Thoracentesis -Volume removal with HD -Pulmonary toilet as able  Hypotension (hypovolemic due to recent Nausea & vomiting +/- developing sepsis) ~ FLUID RESPONSIVE Chronic HFpEF PMHx: HTN, HLD, PAF on Eliquis, Severe PVD s/p bilateral BKA's Echocardiogram 01/22/22: LVEF 55-60%, Grade II DD, RV systolic function normal, mild to moderate MR -Continuous cardiac monitoring -Maintain MAP >65 -Cautious further IVF given hx of ESRD (received 3L IVF in ED) -Vasopressors as needed to maintain MAP goal -Continue Midodrine -Trend lactic acid until normalized (8.4 ~ 5.9 ~ 3.7 ~ 3.5 ~ ) -Volume removal with HD  Suspected HCAP -Monitor fever curve -Trend WBC's & Procalcitonin -Follow cultures as above -Continue empiric Cefepime & Vancomycin pending cultures & sensitivities  ESRD on HD (T,T,S) -Monitor I&O's / urinary output -Follow BMP -Ensure adequate renal perfusion -Avoid nephrotoxic agents as able -Replace electrolytes as indicated -Consult Nephrology, appreciate input ~ HD as per Nephrology  Acute Liver Failure MRCP on admission with NO evidence of biliary obstruction, no intrahepatic biliary duct dilatation, common bile duct normal caliber, evidence of iron deposition in the liver and spleen suggestive of secondary hemochromatosis, small duodenum diverticulum -Trend LFT's and coags -Check Acute Hepatitis viral panel & HIV -Check serum Tylenol  Anemia of Chronic Disease -Monitor for S/Sx of  bleeding -Trend CBC -Eliquis for Anticoagulation/VTE Prophylaxis  -Transfuse for Hgb <7  Diabetes Mellitus Type II -CBG's ac & qhs; Target range of 140 to 180 -SSI -Follow ICU Hypo/Hyperglycemia protocol  History of Stroke with residual left sided weakness -Continue Eliquis for PAF       Best Practice (right click and "Reselect all SmartList Selections" daily)   Diet/type: Regular consistency (see orders) DVT prophylaxis: DOAC GI prophylaxis: N/A Lines: N/A Foley:  N/A Code Status:  full code Last date of multidisciplinary goals of care discussion [N/A]  Labs   CBC: Recent Labs  Lab 05/27/22 1649 05/27/22 1657 05/30/22 0813  WBC 8.5  --  8.9  NEUTROABS  --   --  7.7  HGB 7.6* 8.5* 8.4*  HCT 25.2* 25.0* 28.4*  MCV 94.7  --  96.9  PLT 163  --  062    Basic Metabolic Panel: Recent Labs  Lab 05/27/22 1649 05/27/22 1657 05/28/22 0118 05/30/22 0813  NA 139 137 138 139  K 2.6* 2.7* 3.0*  3.1* 4.3  CL 95* 94* 94* 96*  CO2 25  --  27 21*  GLUCOSE 108* 111* 123* 225*  BUN 6 6 9  32*  CREATININE 3.75* 3.70* 4.56* 8.24*  CALCIUM 8.7*  --  8.4* 9.8  MG  --   --  2.0  --   PHOS  --   --  2.7  --    GFR: Estimated Creatinine Clearance: 9.5 mL/min (A) (by C-G formula based on SCr of 8.24 mg/dL (H)). Recent Labs  Lab 05/27/22 1649 05/30/22 0813 05/30/22 1157  PROCALCITON  --  0.43  --   WBC 8.5 8.9  --   LATICACIDVEN  --  5.9*  8.4* 3.7*    Liver Function Tests: Recent Labs  Lab 05/27/22 1649 05/28/22 0118 05/30/22 0813  AST 25 30 720*  ALT 16 21 407*  ALKPHOS 114 107 175*  BILITOT 2.4* 2.8* 6.7*  PROT 7.7 6.8 7.8  ALBUMIN 3.3* 3.1* 3.5   Recent Labs  Lab 05/27/22 1649 05/30/22 0813  LIPASE 26 33   No results for input(s): "AMMONIA" in the last 168 hours.  ABG    Component Value Date/Time   PHART 7.49 (H) 05/30/2022 0829   PCO2ART 31 (L) 05/30/2022 0829   PO2ART 221 (H) 05/30/2022 0829   HCO3 23.3 05/30/2022 0829   TCO2 28  05/27/2022 1657   ACIDBASEDEF 0.1 05/30/2022 0829   O2SAT 99.9 05/30/2022 0829     Coagulation Profile: Recent Labs  Lab 05/30/22 0813  INR 2.8*    Cardiac Enzymes: No results for input(s): "CKTOTAL", "CKMB", "CKMBINDEX", "TROPONINI" in the last 168 hours.  HbA1C: Hemoglobin A1C  Date/Time Value Ref Range Status  08/30/2021 12:00 AM 6.0  Final  12/30/2020 12:00 AM 6.3  Final   Hgb A1c MFr Bld  Date/Time Value Ref Range Status  01/22/2022 10:18 AM 6.0 (H) 4.8 - 5.6 % Final    Comment:    (NOTE) Pre diabetes:          5.7%-6.4%  Diabetes:              >6.4%  Glycemic control for   <7.0% adults with diabetes     CBG: No results for input(s): "GLUCAP" in the last 168 hours.  Review of Systems:   Positives in BOLD: Gen: Denies fever, chills, weight change, fatigue, night sweats HEENT: Denies blurred vision, double vision, hearing loss, tinnitus, sinus congestion, rhinorrhea, sore throat, neck stiffness, dysphagia PULM: Denies shortness of breath, cough, sputum production, hemoptysis, wheezing CV: Denies chest  pain, edema, orthopnea, paroxysmal nocturnal dyspnea, palpitations GI: Denies abdominal pain, nausea, vomiting, diarrhea, hematochezia, melena, constipation, change in bowel habits GU: Denies dysuria, hematuria, polyuria, oliguria, urethral discharge Endocrine: Denies hot or cold intolerance, polyuria, polyphagia or appetite change Derm: Denies rash, dry skin, scaling or peeling skin change Heme: Denies easy bruising, bleeding, bleeding gums Neuro: Denies headache, numbness, weakness, slurred speech, loss of memory or consciousness   Past Medical History:  She,  has a past medical history of Acute ischemic right MCA stroke ([place]) (03/25/2022), Amputated toe ([place]) (01/31/2012), Diabetes mellitus without complication ([place]), History of diabetic ulcer of foot (04/01/2014), Hyperlipidemia, Hypertension, Stroke (cerebrum) ([place]) (03/21/2022), and Stroke ([place]) (01/21/2022).    Surgical History:   Past Surgical History:  Procedure Laterality Date   angioplasty politeal Left 07/2020   avg for dialysis     CHOLECYSTECTOMY     COLONOSCOPY  08/15/2011   cleared for 10 yrs   IR CT HEAD LTD  01/21/2022   IR CT HEAD LTD  03/21/2022   IR PERCUTANEOUS ART THROMBECTOMY/INFUSION INTRACRANIAL INC DIAG ANGIO  01/21/2022   IR PERCUTANEOUS ART THROMBECTOMY/INFUSION INTRACRANIAL INC DIAG ANGIO  03/21/2022   IR US GUIDE VASC ACCESS RIGHT  01/21/2022   IR US GUIDE VASC ACCESS RIGHT  03/21/2022   LEG AMPUTATION BELOW KNEE Right 04/2019   LEG AMPUTATION BELOW KNEE Left 09/08/2020   RADIOLOGY WITH ANESTHESIA N/A 01/21/2022   Procedure: IR WITH ANESTHESIA;  Surgeon: Radiologist, Medication, MD;  Location: [place];  Service: Radiology;  Laterality: N/A;   RADIOLOGY WITH ANESTHESIA N/A 03/21/2022   Procedure: IR WITH ANESTHESIA;  Surgeon: Radiologist, Medication, MD;  Location: [place];  Service: Radiology;  Laterality: N/A;   TOE AMPUTATION Right 10/15/2018     Social History:   reports that she has never smoked. She has never used smokeless tobacco. She reports current alcohol use. She reports that she does not use drugs.   Family History:  Her family history includes Diabetes in her mother and sister.   Allergies Allergies  Allergen Reactions   Augmentin [Amoxicillin-Pot Clavulanate] Shortness Of Breath   Atorvastatin Other (See Comments)    Back pain   Pioglitazone Other (See Comments)    Edema   Peanut-Containing Drug Products Itching   Rosuvastatin Itching and Other (See Comments)    Back pain   Shellfish-Derived Products Itching and Rash    shrimp   Sulfa Antibiotics Rash and Other (See Comments)    Shedding of skin     Home Medications  Prior to Admission medications   Medication Sig Start Date End Date Taking? Authorizing Provider  acetaminophen (TYLENOL) 325 MG tablet Take 1-2 tablets (325-650 mg total) by mouth every 4 (four) hours as needed for mild pain. 03/27/22   Yes Love, Ivan Anchors, PA-C  apixaban (ELIQUIS) 5 MG TABS tablet Take 1 tablet (5 mg total) by mouth 2 (two) times daily. 03/25/22  Yes Bailey-Modzik, Delila A, NP  linagliptin (TRADJENTA) 5 MG TABS tablet Take 1 tablet (5 mg total) by mouth daily. 04/11/22  Yes Love, Ivan Anchors, PA-C  metoprolol (TOPROL-XL) 200 MG 24 hr tablet Take 1 tablet (200 mg total) by mouth daily. Take with or immediately following a meal. 01/25/22  Yes August Albino, NP  pravastatin (PRAVACHOL) 40 MG tablet Take 1 tablet (40 mg total) by mouth daily at 6 PM. 06/02/22  Yes Glean Hess, MD  cinacalcet (SENSIPAR) 30 MG tablet Take 3 tablets (90 mg total) by mouth Every Tuesday,Thursday,and  Saturday with dialysis. Patient not taking: Reported on 05/30/2022 03/25/22   Bailey-Modzik, Mayo Ao A, NP  liver oil-zinc oxide (DESITIN) 40 % ointment Apply topically 2 (two) times daily. To buttocks Patient not taking: Reported on 05/27/2022 04/10/22   Bary Leriche, PA-C     Critical care time: 40 minutes     Darel Hong, AGACNP-BC [facility] Prefer epic messenger for cross cover needs If after hours, please call E-link

## 2022-05-30 NOTE — Progress Notes (Signed)
Patient unable to obtain follow up ABG. MD aware.

## 2022-05-30 NOTE — ED Notes (Signed)
Patient at MRI 

## 2022-05-30 NOTE — Assessment & Plan Note (Addendum)
Likely due to shock liver.  MRCP is negative for biliary obstruction. -Avoid using Tylenol -Hepatitis panel -Hold pravastatin

## 2022-05-30 NOTE — Assessment & Plan Note (Addendum)
-  Hold pravastatin due to abnormal liver function -Patient is on Eliquis for A-fib

## 2022-05-30 NOTE — ED Notes (Signed)
Pt assisted to reposition on stretcher by this RN and diaylsis RN. HOB adjusted for pt. Stretcher locked low. Rail up. Call bell within reach. Adjusted pulse ox as having trouble getting it to read at fingers, nose and ear. Attending at bedside.

## 2022-05-30 NOTE — ED Notes (Signed)
Adjusted HOB again for pt after sitting her up to take pill; pt given new warm blanket; call bell within reach; pt reminded of call bell in case she needs to contact this RN.

## 2022-05-30 NOTE — ED Notes (Signed)
RT called at this time for ABG.

## 2022-05-30 NOTE — ED Notes (Signed)
Critical Result: Lactic Acid 8.4  Joni Fears, MD aware.

## 2022-05-30 NOTE — ED Notes (Signed)
Pt finished with dinner tray. Pt only ate about 10% from tray.

## 2022-05-30 NOTE — ED Notes (Signed)
Dialysis RN remains at bedside; states another 1.5-2 hours left; visitors at bedside; pt denies any needs currently; dialysis RN states pt recently ate 3-4 grapes.

## 2022-05-30 NOTE — ED Notes (Addendum)
Pt switched from stretcher to [facility] inpatient bed as stated discomfort at sacrum. This RN along with 2 other RN's used sideboard to transfer pt. Pt tolerated well. Pt switched from 6L to 4L and humidifier applied.

## 2022-05-30 NOTE — ED Provider Notes (Signed)
[facility] Provider Note    Event Date/Time   First MD Initiated Contact with Patient 05/30/22 [phone]     (approximate)   History   Chief Complaint: Respiratory Distress   HPI  Cindy Osborne is a 56 y.o. female with a history of hypertension, hyperlipidemia, bilateral BKA, diabetes, ESRD on hemodialysis Tuesday Thursday Saturday who is brought to the ED by EMS due to shortness of breath.  Patient reports that she felt in her usual state of health last night at bedtime, and this morning while putting on close she felt really hot and started getting very short of breath.  Denies cough, denies dizziness or syncope, denies chest pain.  She had a recent hospital admission 3 days ago due to a suspected viral illness with nausea and vomiting.  She was given supportive care, advanced her diet, and was deemed stable for discharge on October 15.     Physical Exam   Triage Vital Signs: ED Triage Vitals  Enc Vitals Group     BP 05/30/22 0750 (!) 86/55     Pulse Rate 05/30/22 0750 72     Resp 05/30/22 0750 18     Temp 05/30/22 0750 (!) 96.2 F (35.7 C)     Temp Source 05/30/22 0750 Axillary     SpO2 05/30/22 0747 (!) 85 %     Weight 05/30/22 0749 220 lb (99.8 kg)     Height 05/30/22 0749 5\' 9"  (1.753 m)     Head Circumference --      Peak Flow --      Pain Score 05/30/22 0749 0     Pain Loc --      Pain Edu? --      Excl. in [place]? --     Most recent vital signs: Vitals:   05/30/22 1345 05/30/22 1400  BP: 101/74 115/76  Pulse:  65  Resp: (!) 24 (!) 26  Temp:    SpO2:  92%    General: Awake, mild respiratory distress.  CV:  Good peripheral perfusion.  Regular rate and rhythm.  Heart sounds quiet.  Weak radial pulses. Resp:  Tachypnea without accessory muscle use.  Lungs clear to auscultation bilaterally, no wheezing or crackles. Abd:  No distention.  Soft nontender Other:  Bilateral BKA.  Dry mucous membranes.   ED Results / Procedures /  Treatments   Labs (all labs ordered are listed, but only abnormal results are displayed) Labs Reviewed  LACTIC ACID, PLASMA - Abnormal; Notable for the following components:      Result Value   Lactic Acid, Venous 8.4 (*)    All other components within normal limits  LACTIC ACID, PLASMA - Abnormal; Notable for the following components:   Lactic Acid, Venous 5.9 (*)    All other components within normal limits  COMPREHENSIVE METABOLIC PANEL - Abnormal; Notable for the following components:   Chloride 96 (*)    CO2 21 (*)    Glucose, Bld 225 (*)    BUN 32 (*)    Creatinine, Ser 8.24 (*)    AST 720 (*)    ALT 407 (*)    Alkaline Phosphatase 175 (*)    Total Bilirubin 6.7 (*)    GFR, Estimated 5 (*)    Anion gap 22 (*)    All other components within normal limits  CBC WITH DIFFERENTIAL/PLATELET - Abnormal; Notable for the following components:   RBC 2.93 (*)    Hemoglobin 8.4 (*)  HCT 28.4 (*)    MCHC 29.6 (*)    RDW 18.4 (*)    nRBC 0.6 (*)    Abs Immature Granulocytes 0.15 (*)    All other components within normal limits  PROTIME-INR - Abnormal; Notable for the following components:   Prothrombin Time 29.0 (*)    INR 2.8 (*)    All other components within normal limits  APTT - Abnormal; Notable for the following components:   aPTT 41 (*)    All other components within normal limits  BLOOD GAS, ARTERIAL - Abnormal; Notable for the following components:   pH, Arterial 7.49 (*)    pCO2 arterial 31 (*)    pO2, Arterial 221 (*)    All other components within normal limits  LACTIC ACID, PLASMA - Abnormal; Notable for the following components:   Lactic Acid, Venous 3.7 (*)    All other components within normal limits  RESP PANEL BY RT-PCR (FLU A&B, COVID) [id number]  CULTURE, BLOOD (ROUTINE X 2)  CULTURE, BLOOD (ROUTINE X 2)  URINE CULTURE  PROCALCITONIN  LIPASE, BLOOD  URINALYSIS, COMPLETE (UACMP) WITH MICROSCOPIC  BLOOD GAS, ARTERIAL  LACTIC ACID, PLASMA  LACTIC ACID,  PLASMA  LACTIC ACID, PLASMA  LACTIC ACID, PLASMA  LACTATE DEHYDROGENASE  POC URINE PREG, ED     EKG Interpreted by me Sinus rhythm rate of 71, normal axis, slightly prolonged QTc of 506 ms, slight first-degree AV block.  Poor R wave progression.  Normal ST segments.  Slight T wave inversion in lateral leads.  No electrical alternans or low voltage amplitudes of the QRS.  Appearance grossly unchanged compared to previous EKG on May 28, 2022.   RADIOLOGY Chest x-ray interpreted by me, shows right sided pleural effusion.  Radiology report reviewed.  CT chest abdomen pelvis pending  MRCP pending   PROCEDURES:  .Critical Care  Performed by: Carrie Mew, MD Authorized by: Carrie Mew, MD   Critical care provider statement:    Critical care time (minutes):  35   Critical care time was exclusive of:  Separately billable procedures and treating other patients   Critical care was necessary to treat or prevent imminent or life-threatening deterioration of the following conditions:  Sepsis, shock, respiratory failure, circulatory failure and renal failure   Critical care was time spent personally by me on the following activities:  Development of treatment plan with patient or surrogate, discussions with consultants, evaluation of patient's response to treatment, examination of patient, obtaining history from patient or surrogate, ordering and performing treatments and interventions, ordering and review of laboratory studies, ordering and review of radiographic studies, pulse oximetry, re-evaluation of patient's condition and review of old charts   Care discussed with: admitting provider      MEDICATIONS ORDERED IN ED: Medications  midodrine (PROAMATINE) tablet 10 mg (10 mg Oral Given 05/30/22 1420)  albuterol (PROVENTIL) (2.5 MG/3ML) 0.083% nebulizer solution 2.5 mg (has no administration in time range)  dextromethorphan-guaiFENesin (MUCINEX DM) 30-600 MG per 12 hr tablet  1 tablet (has no administration in time range)  diphenhydrAMINE (BENADRYL) injection 12.5 mg (has no administration in time range)  acetaminophen (TYLENOL) tablet 650 mg (has no administration in time range)  insulin aspart (novoLOG) injection 0-9 Units (has no administration in time range)  insulin aspart (novoLOG) injection 0-5 Units (has no administration in time range)  vancomycin (VANCOCIN) IVPB 1000 mg/200 mL premix (0 mg Intravenous Stopped 05/30/22 1030)  ceFEPIme (MAXIPIME) 2 g in sodium chloride 0.9 % 100 mL IVPB (  0 g Intravenous Stopped 05/30/22 0901)  sodium chloride 0.9 % bolus 500 mL (0 mLs Intravenous Stopped 05/30/22 1014)  sodium chloride 0.9 % bolus 2,500 mL (2,500 mLs Intravenous New Bag/Given 05/30/22 1013)  metroNIDAZOLE (FLAGYL) IVPB 500 mg (0 mg Intravenous Stopped 05/30/22 1215)  iohexol (OMNIPAQUE) 300 MG/ML solution 100 mL (100 mLs Intravenous Contrast Given 05/30/22 1106)     IMPRESSION / MDM / ASSESSMENT AND PLAN / ED COURSE  I reviewed the triage vital signs and the nursing notes.                              Differential diagnosis includes, but is not limited to, pneumonia, pleural effusion, pulmonary edema, pulmonary embolism, sepsis, pericardial effusion, electrolyte abnormality, viral illness  Patient's presentation is most consistent with acute presentation with potential threat to life or bodily function.  Patient presents with shortness of breath and tachypnea.  She has acute hypoxic respiratory failure with an oxygen saturation of 85% on nonrebreather, although pulse oximetry is not reading reliably for this patient.  Will obtain ABG to verify.  Initial blood pressure in the ED was 86/55 but on repeat it is normal.  No signs of shock at present time, not requiring large-volume fluid resuscitation.  We will give initial challenge of 500 mL of fluids since she clinically appears dehydrated and has been recently ill with a GI syndrome.  Will obtain labs,  chest x-ray, likely CT chest.   ----------------------------------------- 2:26 PM on 05/30/2022 ----------------------------------------- CT chest abdomen pelvis shows complex pleural effusion on the right, suspicious for infection versus malignancy.  Radiology recommends obtaining thoracentesis for diagnostic purposes.  MRCP negative for biliary obstruction.  LFT elevation appear to be due to shock liver.  Case discussed with hospitalist for further management of HCAP.      FINAL CLINICAL IMPRESSION(S) / ED DIAGNOSES   Final diagnoses:  HCAP (healthcare-associated pneumonia)  Acute respiratory failure with hypoxia ([place])  Septic shock ([place])  ESRD on hemodialysis ([place])     Rx / DC Orders   ED Discharge Orders     None        Note:  This document was prepared using Dragon voice recognition software and may include unintentional dictation errors.   Carrie Mew, MD 05/30/22 1427

## 2022-05-30 NOTE — ED Notes (Signed)
Dialysis RN at bedside. Pt alert, fatigued, calmly laying on bed. Dialysis RN given consent form and notified it still needs to be filled out and signed by witness and pt.

## 2022-05-30 NOTE — Assessment & Plan Note (Signed)
Hemoglobin stable, 7.6 on 05/27/2022 --> 8.4 today -Follow-up with CBC

## 2022-05-30 NOTE — Assessment & Plan Note (Addendum)
-  Hold pravastatin due to abnormal liver function

## 2022-05-30 NOTE — Assessment & Plan Note (Signed)
Heart rate 66 currently, heart rate was up to 129 earlier. - Hold oral metoprolol due to hypotension -As needed IV metoprolol 5 mg for heart rate > 125

## 2022-05-30 NOTE — ED Notes (Signed)
Patient placed on 6L [place] per Joni Fears, MD.

## 2022-05-31 ENCOUNTER — Telehealth: Payer: Self-pay | Admitting: Internal Medicine

## 2022-05-31 ENCOUNTER — Inpatient Hospital Stay: Payer: Medicare Other

## 2022-05-31 DIAGNOSIS — A419 Sepsis, unspecified organism: Secondary | ICD-10-CM | POA: Diagnosis present

## 2022-05-31 DIAGNOSIS — R7989 Other specified abnormal findings of blood chemistry: Secondary | ICD-10-CM | POA: Diagnosis not present

## 2022-05-31 DIAGNOSIS — J9601 Acute respiratory failure with hypoxia: Secondary | ICD-10-CM | POA: Diagnosis not present

## 2022-05-31 DIAGNOSIS — N186 End stage renal disease: Secondary | ICD-10-CM | POA: Diagnosis not present

## 2022-05-31 DIAGNOSIS — J189 Pneumonia, unspecified organism: Secondary | ICD-10-CM | POA: Diagnosis not present

## 2022-05-31 LAB — CBG MONITORING, ED
Glucose-Capillary: 81 mg/dL (ref 70–99)
Glucose-Capillary: 147 mg/dL — ABNORMAL HIGH (ref 70–99)
Glucose-Capillary: 215 mg/dL — ABNORMAL HIGH (ref 70–99)
Glucose-Capillary: 97 mg/dL (ref 70–99)

## 2022-05-31 LAB — CBC
HCT: 23.3 % — ABNORMAL LOW (ref 36.0–46.0)
Hemoglobin: 7 g/dL — ABNORMAL LOW (ref 12.0–15.0)
MCH: 29 pg (ref 26.0–34.0)
MCHC: 30 g/dL (ref 30.0–36.0)
MCV: 96.7 fL (ref 80.0–100.0)
Platelets: 169 10*3/uL (ref 150–400)
RBC: 2.41 MIL/uL — ABNORMAL LOW (ref 3.87–5.11)
RDW: 18.6 % — ABNORMAL HIGH (ref 11.5–15.5)
WBC: 8.2 10*3/uL (ref 4.0–10.5)
nRBC: 2.3 % — ABNORMAL HIGH (ref 0.0–0.2)

## 2022-05-31 LAB — PROCALCITONIN: Procalcitonin: 2.77 ng/mL

## 2022-05-31 LAB — PROTEIN, PLEURAL OR PERITONEAL FLUID: Total protein, fluid: 3 g/dL

## 2022-05-31 LAB — BASIC METABOLIC PANEL
Anion gap: 12 (ref 5–15)
BUN: 20 mg/dL (ref 6–20)
CO2: 25 mmol/L (ref 22–32)
Calcium: 8.8 mg/dL — ABNORMAL LOW (ref 8.9–10.3)
Chloride: 100 mmol/L (ref 98–111)
Creatinine, Ser: 5.17 mg/dL — ABNORMAL HIGH (ref 0.44–1.00)
GFR, Estimated: 9 mL/min — ABNORMAL LOW (ref >60)
Glucose, Bld: 91 mg/dL (ref 70–99)
Potassium: 3.6 mmol/L (ref 3.5–5.1)
Sodium: 137 mmol/L (ref 135–145)

## 2022-05-31 LAB — PREPARE RBC (CROSSMATCH)

## 2022-05-31 LAB — GLUCOSE, PLEURAL OR PERITONEAL FLUID: Glucose, Fluid: 83 mg/dL

## 2022-05-31 LAB — BODY FLUID CELL COUNT WITH DIFFERENTIAL
Eos, Fluid: 0 %
Lymphs, Fluid: 79 %
Monocyte-Macrophage-Serous Fluid: 13 %
Neutrophil Count, Fluid: 8 %
Total Nucleated Cell Count, Fluid: 292 cu mm

## 2022-05-31 LAB — PHOSPHORUS: Phosphorus: 3.3 mg/dL (ref 2.5–4.6)

## 2022-05-31 LAB — LACTATE DEHYDROGENASE, PLEURAL OR PERITONEAL FLUID: LD, Fluid: 240 U/L — ABNORMAL HIGH (ref 3–23)

## 2022-05-31 LAB — LACTIC ACID, PLASMA
Lactic Acid, Venous: 3.2 mmol/L (ref 0.5–1.9)
Lactic Acid, Venous: 3.1 mmol/L (ref 0.5–1.9)

## 2022-05-31 LAB — MAGNESIUM: Magnesium: 1.9 mg/dL (ref 1.7–2.4)

## 2022-05-31 MED ORDER — SODIUM CHLORIDE 0.9% IV SOLUTION: Freq: Once | INTRAVENOUS | Status: AC

## 2022-05-31 MED ORDER — HYDROMORPHONE HCL 1 MG/ML IJ SOLN
1.0000 mg | INTRAMUSCULAR | Status: DC | PRN
  Administered 2022-06-01: 1 mg via INTRAVENOUS
  Filled 2022-05-31: qty 1

## 2022-05-31 MED ORDER — HYDROXYZINE HCL 10 MG PO TABS
10.0000 mg | ORAL_TABLET | Freq: Three times a day (TID) | ORAL | Status: DC | PRN
  Administered 2022-05-31 – 2022-06-02 (×4): 10 mg via ORAL
  Filled 2022-05-31 (×6): qty 1

## 2022-05-31 MED ORDER — OXYCODONE HCL 5 MG PO TABS
5.0000 mg | ORAL_TABLET | Freq: Four times a day (QID) | ORAL | Status: DC | PRN
  Administered 2022-05-31 – 2022-06-03 (×2): 5 mg via ORAL
  Filled 2022-05-31 (×2): qty 1

## 2022-05-31 MED ORDER — LIDOCAINE HCL (PF) 1 % IJ SOLN
10.0000 mL | Freq: Once | INTRAMUSCULAR | Status: AC
  Administered 2022-05-31: 10 mL via INTRADERMAL

## 2022-05-31 NOTE — ED Notes (Signed)
IV team at bedside 

## 2022-05-31 NOTE — ED Notes (Signed)
Lab with pt 

## 2022-05-31 NOTE — ED Notes (Signed)
Pt alert, drinking water, family with pt.  Prbc's infusing.

## 2022-05-31 NOTE — Progress Notes (Signed)
Cindy Osborne  MRN: [id number]  DOB/AGE: 1965-09-20 56 y.o.  Primary Care Physician:Berglund, [name], MD  Admit date: 05/30/2022  Chief Complaint:  Chief Complaint  Patient presents with   Respiratory Distress    S-Pt presented on  05/30/2022 with  Chief Complaint  Patient presents with   Respiratory Distress  .  Patient is a 56 year old African-American female with a past medical history of end-stage renal disease, patient is on hemodialysis on Tuesday Thursday Saturday schedule, hypertension, hyperlipidemia, diabetes mellitus, CVA, history of bilateral BKA, peripheral vascular disease, atrial fibrillation-on anticoagulants, diastolic CHF, anemia who came to the ER with chief complaint of shortness of breath.  History of present illness dates back to this morning when patient started feeling short of breath was progressively getting worse so she came to the ER. Upon evaluation in the ER patient was found to be hypoxic, in severe respiratory distress and was started on nonrebreather.  Patient also found to be hypotensive and was given normal saline bolus.Nephrology was consulted for comanagement of dialysis patient. Patient is well-known to our practice as patient sees Dr. Holley Raring as an outpatient  Patient was seen today in the ER. Patient informed me that her breathing was better than yesterday   Medications   apixaban  5 mg Oral BID   Chlorhexidine Gluconate Cloth  6 each Topical Q0600   insulin aspart  0-5 Units Subcutaneous QHS   insulin aspart  0-9 Units Subcutaneous TID WC   midodrine  10 mg Oral TID WC         [id number] from the symptoms mentioned above,there are no other symptoms referable to all systems reviewed.  Physical Exam: Vital signs in last 24 hours: Temp:  [97.5 F (36.4 C)-98.8 F (37.1 C)] 98.1 F (36.7 C) (10/18 0726) Pulse Rate:  [41-129] 73 (10/18 0726) Resp:  [12-34] 20 (10/18 0726) BP: (84-129)/(49-84) 118/71 (10/18 0726) SpO2:  [89  %-100 %] 97 % (10/18 0726) Weight change:     Intake/Output from previous day: No intake/output data recorded. No intake/output data recorded.   Physical Exam:  General- pt is awake,alert, oriented to time place and person  HEENT-sclera is icteric, conjunctive  is pale, head is atraumatic normocephalic  Resp- Mild REsp distress, Decreased breath sounds at bases  CVS- S1S2 irregular in rate and rhythm  GIT- BS+, soft, Non tender , Non distended  EXT- Bilateral BKA,  No Cyanosis  Access- Left upper extremity AV fistula present  Lab Results:  CBC  Recent Labs    05/30/22 0813 05/31/22 0510  WBC 8.9 8.2  HGB 8.4* 7.0*  HCT 28.4* 23.3*  PLT 213 169    BMET  Recent Labs    05/30/22 0813 05/31/22 0510  NA 139 137  K 4.3 3.6  CL 96* 100  CO2 21* 25  GLUCOSE 225* 91  BUN 32* 20  CREATININE 8.24* 5.17*  CALCIUM 9.8 8.8*      Most recent Creatinine trend  Lab Results  Component Value Date   CREATININE 5.17 (H) 05/31/2022   CREATININE 8.24 (H) 05/30/2022   CREATININE 4.56 (H) 05/28/2022      MICRO   Recent Results (from the past 240 hour(s))  Resp Panel by RT-PCR (Flu A&B, Covid) Anterior Nasal Swab     Status: None   Collection Time: 05/30/22  8:13 AM   Specimen: Anterior Nasal Swab  Result Value Ref Range Status   SARS Coronavirus 2 by RT PCR NEGATIVE NEGATIVE Final    Comment: (  NOTE) SARS-CoV-2 target nucleic acids are NOT DETECTED.  The SARS-CoV-2 RNA is generally detectable in upper respiratory specimens during the acute phase of infection. The lowest concentration of SARS-CoV-2 viral copies this assay can detect is 138 copies/mL. A negative result does not preclude SARS-Cov-2 infection and should not be used as the sole basis for treatment or other patient management decisions. A negative result may occur with  improper specimen collection/handling, submission of specimen other than nasopharyngeal swab, presence of viral mutation(s)  within the areas targeted by this assay, and inadequate number of viral copies(<138 copies/mL). A negative result must be combined with clinical observations, patient history, and epidemiological information. The expected result is Negative.  Fact Sheet for Patients:  EntrepreneurPulse.com.au  Fact Sheet for Healthcare Providers:  IncredibleEmployment.be  This test is no t yet approved or cleared by the [place] FDA and  has been authorized for detection and/or diagnosis of SARS-CoV-2 by FDA under an Emergency Use Authorization (EUA). This EUA will remain  in effect (meaning this test can be used) for the duration of the COVID-19 declaration under Section 564(b)(1) of the Act, 21 U.S.C.section 360bbb-3(b)(1), unless the authorization is terminated  or revoked sooner.       Influenza A by PCR NEGATIVE NEGATIVE Final   Influenza B by PCR NEGATIVE NEGATIVE Final    Comment: (NOTE) The Xpert Xpress SARS-CoV-2/FLU/RSV plus assay is intended as an aid in the diagnosis of influenza from Nasopharyngeal swab specimens and should not be used as a sole basis for treatment. Nasal washings and aspirates are unacceptable for Xpert Xpress SARS-CoV-2/FLU/RSV testing.  Fact Sheet for Patients: EntrepreneurPulse.com.au  Fact Sheet for Healthcare Providers: IncredibleEmployment.be  This test is not yet approved or cleared by the [place] FDA and has been authorized for detection and/or diagnosis of SARS-CoV-2 by FDA under an Emergency Use Authorization (EUA). This EUA will remain in effect (meaning this test can be used) for the duration of the COVID-19 declaration under Section 564(b)(1) of the Act, 21 U.S.C. section 360bbb-3(b)(1), unless the authorization is terminated or revoked.  Performed at [facility], [address] [place], [place] [address]          Impression:   1)Renal     End-stage renal disease Patient is on hemodialysis Patient is on Tuesday Thursday Saturday schedule Patient was last dialyzed yesterday No need for renal placement therapy today   2)Hypotension Patient was admitted with septic shock. Patient is hemodynamically better   3)Anemia of chronic disease     Latest Ref Rng & Units 05/31/2022    5:10 AM 05/30/2022    8:13 AM 05/27/2022    4:57 PM  CBC  WBC 4.0 - 10.5 K/uL 8.2  8.9    Hemoglobin 12.0 - 15.0 g/dL 7.0  8.4  8.5   Hematocrit 36.0 - 46.0 % 23.3  28.4  25.0   Platelets 150 - 400 K/uL 169  213         HGb is not at goal (9--11) Patient is on Mircera as an outpatient  We will discuss with primary team regarding need for PRBC  4) Secondary hyperparathyroidism -CKD Mineral-Bone Disorder    Lab Results  Component Value Date   CALCIUM 8.8 (L) 05/31/2022   CAION 0.90 (L) 05/27/2022   PHOS 3.3 05/31/2022    Patient has history of secondary parathyroidism Patient intact PTH level was at 584 as an outpatient Patient is on Hectorol and Sensipar  Phosphorus at goal.   5)Healthcare  associated pneumonia/severe sepsis/septic shock Patient is on broad-spectrum antibiotics Patient is being followed by primary team, ICU team   6) Electrolytes      Latest Ref Rng & Units 05/31/2022    5:10 AM 05/30/2022    8:13 AM 05/28/2022    1:18 AM  BMP  Glucose 70 - 99 mg/dL 91  225  123   BUN 6 - 20 mg/dL 20  32  9   Creatinine 0.44 - 1.00 mg/dL 5.17  8.24  4.56   Sodium 135 - 145 mmol/L 137  139  138   Potassium 3.5 - 5.1 mmol/L 3.6  4.3  3.0    3.1   Chloride 98 - 111 mmol/L 100  96  94   CO2 22 - 32 mmol/L 25  21  27    Calcium 8.9 - 10.3 mg/dL 8.8  9.8  8.4      Sodium Normonatremic   Potassium Normokalemic    7)Acid base   Co2 at goal  8)Pleural effusion Patient CT chest had shown moderate size complicated right pleural effusion Patient may need tap Patient is being followed by pulmonary team and  primary team  9)Liver failure Patient total bilirubin is elevated at 6.7 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 No evidence of biliary obstruction. No intrahepatic biliary duct dilatation. The common bile duct normal caliber. 2. 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 Query blood transfusion history. 3. Pancreas appears normal. Small periampullary duodenum diverticulum. 4. Septated RIGHT pleural effusion with dense RIGHT lower lobe atelectasis. Primary team and ICU team is following   10)History of CVA Patient has history of CVA with residual left-sided weakness Patient is clinically stable   11)Atrial fibrillation Patient has history of atrial fibrillation Patient is on Eliquis   Plan:  No need for renal placement therapy today I discussed the primary team about need for PRBC.     Artin Mceuen s Theador Hawthorne 05/31/2022, 7:57 AM

## 2022-05-31 NOTE — Progress Notes (Signed)
[facility]                                                                              Dejai Schubach, is a 56 y.o. female, DOB - 1966/04/06, VOZ:[id number] Admit date - 05/30/2022    Outpatient Primary MD for the patient is [name], [name], MD  LOS - 1  days  Chief Complaint  Patient presents with   Respiratory Distress       Brief summary   Patient is a 56 year old female with history of ESRD on HD (TTS), HTN, HLD, DM, stroke, s/p of bilateral BKA, PVD, PAF on Eliquis, dCHF, anemia presented with shortness of breath.  Patient reported shortness of breath/started on the day of the admission, progressively worsening.  Associated with dry cough but no fever chills or chest pain.  She was hospitalized from 10/14 to 10/15 due to nausea and vomiting which has resolved.  Patient had dialysis on Saturday. In ED was found to be in respiratory distress, O2 sats 85% on room air, could not speak in full sentences, placed on nonrebreather.  At baseline, does not use O2.   Initially patient was hypotensive with BP 85/74, improved to 200s after 3 L normal saline bolus.  WBCs 8.9, COVID-19 negative. abnormal liver function (ALP 175, AST 720, ALT 407, total bilirubin 6.7). ABG with pH 7.49, CO2 31, O2 221. Procalcitonin 0.43  lactic acid 8.4 --. 5.9 --> 3.7.  Chest x-ray showed cardiomegaly, vascular congestion and moderate right pleural effusion Pulm critical care and nephrology was consulted    Assessment & Plan    Principal Problem: Severe sepsis with septic shock HCAP (healthcare-associated pneumonia)  -Patient met sepsis criteria at the time of admission with hypotension, hypoxia, transaminitis, lactic acidosis, source likely due to HCAP -Sepsis physiology now improving -Wean O2 as tolerated  Active Problems:   Acute respiratory failure with hypoxia ([place]), Pleural effusion on right, HCAP -CT chest abdomen and pelvis showed moderate size complicated right pleural  effusion with associated pleural thickening and possibly pleural nodularity.  Size is similar to available prior chest radiographs, such that empyema is unlikely, malignant effusion cannot be excluded -Underwent thoracentesis today, yielded 350 mL of blood-tinged fluid -Follow labs, wean O2 as tolerated -Follow urine Legionella antigen, urine strep antigen -Continue IV vancomycin, cefepime    Abnormal LFTs/acute transaminitis -Possibly due to shock liver -MR CP showed no evidence of biliary obstruction, no intrahepatic biliary duct dilatation, normal caliber CBD.  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  ESRD on dialysis, TTS -Nephrology was consulted, underwent HD on 10/17 -Continue HD per schedule    Anemia in ESRD (end-stage renal disease) ([place]) -H&H currently stable    Essential hypertension -BP currently stable, initially was hypotensive on admission requiring 3 L IV fluid bolus -Start midodrine 10 mg 3 times daily    History of stroke with current residual effects -Continue eliquis    HLD (hyperlipidemia) -Hold pravastatin     PVD (peripheral vascular disease) ([place]) -Continue eliquis   paroxysmal A-fib -BP was low on admission, placed on midodrine -Heart rate currently controlled, not on metoprolol -  continue eliquis    Type II diabetes mellitus with renal manifestations ([place]), -Continue SSI -CBGs controlled -Follow hemoglobin A1c Recent Labs    05/30/22 1758 05/30/22 2246 05/31/22 0800 05/31/22 1227  GLUCAP 136* 90 81 147*      Chronic diastolic CHF (congestive heart failure) (H[facility]) -volume management with HD -2D echo 01/2022 showed EF of 55 to 60% with G2 DD    Thyroid nodule -Follow-up with PCP, was scheduled for thyroid ultrasound as outpatient on 10/20    Obesity with body mass index (BMI) of 30.0 to 39.9 Estimated body mass index is 32.49 kg/m as calculated from the following:   Height as of this  encounter: _0  (1.753 m).   Weight as of this encounter: 99.8 kg.  Code Status: Full CODE STATUS DVT Prophylaxis:   apixaban (ELIQUIS) tablet 5 mg   Level of Care: Level of care: Stepdown Family Communication: Updated patient   Disposition Plan:      Remains inpatient appropriate: Improving from yesterday, however multiple medical issues preclude safe discharge at this time.   Procedures:    Consultants:   Nephrology CCM   Antimicrobials:   Anti-infectives (From admission, onward)    Start     Dose/Rate Route Frequency Ordered Stop   06/01/22 1200  vancomycin (VANCOCIN) IVPB 1000 mg/200 mL premix        1,000 mg 200 mL/hr over 60 Minutes Intravenous Every T-Th-Sa (Hemodialysis) 05/30/22 1503     05/31/22 1400  ceFEPIme (MAXIPIME) 1 g in sodium chloride 0.9 % 100 mL IVPB        1 g 200 mL/hr over 30 Minutes Intravenous Every 24 hours 05/30/22 1503     05/30/22 1000  metroNIDAZOLE (FLAGYL) IVPB 500 mg        500 mg 100 mL/hr over 60 Minutes Intravenous  Once 05/30/22 0959 05/30/22 1215   05/30/22 0830  vancomycin (VANCOCIN) IVPB 1000 mg/200 mL premix        1,000 mg 200 mL/hr over 60 Minutes Intravenous  Once 05/30/22 0816 05/30/22 1030   05/30/22 0830  ceFEPIme (MAXIPIME) 2 g in sodium chloride 0.9 % 100 mL IVPB        2 g 200 mL/hr over 30 Minutes Intravenous  Once 05/30/22 0816 05/30/22 0901          Medications  apixaban  5 mg Oral BID   Chlorhexidine Gluconate Cloth  6 each Topical Q0600   insulin aspart  0-5 Units Subcutaneous QHS   insulin aspart  0-9 Units Subcutaneous TID WC   midodrine  10 mg Oral TID WC      Subjective:   Amos Gaber was seen and examined today.  Complaining of RUQ abdominal pain, still has shortness of breath. No dizziness, chest pain, shortness of breath, no nausea or vomiting.  No fevers  Objective:   Vitals:   05/31/22 1115 05/31/22 1245 05/31/22 1300 05/31/22 1415  BP: 111/72     Pulse: 75  74 73  Resp: 17      Temp:  98.1 F (36.7 C)    TempSrc:      SpO2: 100%  98% 100%  Weight:      Height:        Intake/Output Summary (Last 24 hours) at 05/31/2022 1418 Last data filed at 05/30/2022 1950 Gross per 24 hour  Intake --  Output 0 ml  Net 0 ml     Wt Readings from Last 3 Encounters:  05/30/22 99.8 kg  05/27/22 96.6 kg  05/23/22 87 kg     Exam General: Alert and oriented x 3, NAD Cardiovascular: S1 S2 auscultated,  RRR Respiratory: Rales at bases, Rt >Lt Gastrointestinal: Soft, mild RUQ TTP , nondistended, + bowel sounds Ext: bilateral BKA Neuro:no new FNDs Psych: Normal affect    Data Reviewed:  I have personally reviewed following labs    CBC Lab Results  Component Value Date   WBC 8.2 05/31/2022   RBC 2.41 (L) 05/31/2022   HGB 7.0 (L) 05/31/2022   HCT 23.3 (L) 05/31/2022   MCV 96.7 05/31/2022   MCH 29.0 05/31/2022   PLT 169 05/31/2022   MCHC 30.0 05/31/2022   RDW 18.6 (H) 05/31/2022   LYMPHSABS 0.7 05/30/2022   MONOABS 0.3 05/30/2022   EOSABS 0.0 05/30/2022   BASOSABS 0.0 90/24/0973     Last metabolic panel Lab Results  Component Value Date   NA 137 05/31/2022   K 3.6 05/31/2022   CL 100 05/31/2022   CO2 25 05/31/2022   BUN 20 05/31/2022   CREATININE 5.17 (H) 05/31/2022   GLUCOSE 91 05/31/2022   GFRNONAA 9 (L) 05/31/2022   GFRAA 4 02/02/2021   CALCIUM 8.8 (L) 05/31/2022   PHOS 3.3 05/31/2022   PROT 7.8 05/30/2022   ALBUMIN 3.5 05/30/2022   LABGLOB 2.9 06/30/2020   AGRATIO 1.4 06/30/2020   BILITOT 6.7 (H) 05/30/2022   ALKPHOS 175 (H) 05/30/2022   AST 720 (H) 05/30/2022   ALT 407 (H) 05/30/2022   ANIONGAP 12 05/31/2022    CBG (last 3)  Recent Labs    05/30/22 2246 05/31/22 0800 05/31/22 1227  GLUCAP 90 81 147*      Coagulation Profile: Recent Labs  Lab 05/30/22 0813  INR 2.8*     Radiology Studies: I have personally reviewed the imaging studies  US THORACENTESIS ASP PLEURAL SPACE W/IMG GUIDE  Result Date:  05/31/2022 INDICATION: Patient with a history of end-stage renal disease and heart failure presents to the ED with shortness of breath. She was found to have a right pleural effusion. Interventional radiology asked to perform a diagnostic and therapeutic thoracentesis. EXAM: ULTRASOUND GUIDED THORACENTESIS MEDICATIONS: 1% lidocaine 10 mL COMPLICATIONS: None immediate. PROCEDURE: An ultrasound guided thoracentesis was thoroughly discussed with the patient and questions answered. The benefits, risks, alternatives and complications were also discussed. The patient understands and wishes to proceed with the procedure. Written consent was obtained. Ultrasound was performed to localize and mark an adequate pocket of fluid in the right chest. The area was then prepped and draped in the normal sterile fashion. 1% Lidocaine was used for local anesthesia. Under ultrasound guidance a 6 Fr Safe-T-Centesis catheter was introduced. Thoracentesis was performed. The catheter was removed and a dressing applied. FINDINGS: A total of approximately 350 mL of blood-tinged fluid was removed. Samples were sent to the laboratory as requested by the clinical team. IMPRESSION: Successful ultrasound guided right thoracentesis yielding 350 mL of pleural fluid. Read by: Soyla Dryer, NP Electronically Signed   By: Aletta Edouard M.D.   On: 05/31/2022 12:39   DG Chest Port 1 View  Result Date: 05/31/2022 CLINICAL DATA:  Shortness of breath. Status post thoracentesis. 350 mL drawn of right-sided. EXAM: PORTABLE CHEST 1 VIEW COMPARISON:  CT chest dated May 30, 2022 FINDINGS: The heart is mildly enlarged. Interval decrease in size of the right pleural effusion. Right basilar opacity suggesting atelectasis and/or small residual effusion. No appreciable pneumothorax. Left lung is clear. No acute osseous abnormality IMPRESSION: Interval  decrease in size of the right pleural effusion status post thoracentesis. No appreciable pneumothorax.  Electronically Signed   By: Keane Police D.O.   On: 05/31/2022 12:04   MR ABDOMEN MRCP WO CONTRAST  Result Date: 05/30/2022 CLINICAL DATA:  Respiratory distress and weakness. Jaundice. Elevated liver enzymes EXAM: MRI ABDOMEN WITHOUT CONTRAST  (INCLUDING MRCP) TECHNIQUE: Multiplanar multisequence MR imaging of the abdomen was performed. Heavily T2-weighted images of the biliary and pancreatic ducts were obtained, and three-dimensional MRCP images were rendered by post processing. COMPARISON:  CT 05/30/2022 FINDINGS: Lower chest: Large septated RIGHT pleural effusion with dense atelectasis of the RIGHT lower lobe. Hepatobiliary: There is no intrahepatic biliary duct dilatation. There is loss of signal intensity on T2 weighted imaging within liver suggesting iron deposition. This is also represented as loss of signal intensity on inphase imaging (series 6). There is no intrahepatic biliary duct dilatation. The common bile duct is normal caliber. Postcholecystectomy. Pancreas: Normal pancreatic parenchymal intensity. No ductal dilatation or inflammation. Spleen: Low signal intensity within the spleen. Adrenals/urinary tract: Adrenal glands normal. No renal obstruction. Multiple small benign appearing renal cysts. Stomach/Bowel: Stomach is normal. Periampullary duodenal diverticulum measures 1.5 cm. Limited view of the bowel is unremarkable. Vascular/Lymphatic: Abdominal aortic normal caliber. No retroperitoneal periportal lymphadenopathy. Musculoskeletal: No aggressive osseous lesion IMPRESSION: 1. No evidence of biliary obstruction. No intrahepatic biliary duct dilatation. The common bile duct normal caliber. 2. 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 Query blood transfusion history. 3. Pancreas appears normal. Small periampullary duodenum diverticulum. 4. Septated RIGHT pleural effusion with dense RIGHT lower lobe atelectasis. Electronically Signed   By: Suzy Bouchard M.D.   On: 05/30/2022 13:40   MR 3D Recon At Scanner  Result Date: 05/30/2022 CLINICAL DATA:  Respiratory distress and weakness. Jaundice. Elevated liver enzymes EXAM: MRI ABDOMEN WITHOUT CONTRAST  (INCLUDING MRCP) TECHNIQUE: Multiplanar multisequence MR imaging of the abdomen was performed. Heavily T2-weighted images of the biliary and pancreatic ducts were obtained, and three-dimensional MRCP images were rendered by post processing. COMPARISON:  CT 05/30/2022 FINDINGS: Lower chest: Large septated RIGHT pleural effusion with dense atelectasis of the RIGHT lower lobe. Hepatobiliary: There is no intrahepatic biliary duct dilatation. There is loss of signal intensity on T2 weighted imaging within liver suggesting iron deposition. This is also represented as loss of signal intensity on inphase imaging (series 6). There is no intrahepatic biliary duct dilatation. The common bile duct is normal caliber. Postcholecystectomy. Pancreas: Normal pancreatic parenchymal intensity. No ductal dilatation or inflammation. Spleen: Low signal intensity within the spleen. Adrenals/urinary tract: Adrenal glands normal. No renal obstruction. Multiple small benign appearing renal cysts. Stomach/Bowel: Stomach is normal. Periampullary duodenal diverticulum measures 1.5 cm. Limited view of the bowel is unremarkable. Vascular/Lymphatic: Abdominal aortic normal caliber. No retroperitoneal periportal lymphadenopathy. Musculoskeletal: No aggressive osseous lesion IMPRESSION: 1. No evidence of biliary obstruction. No intrahepatic biliary duct dilatation. The common bile duct normal caliber. 2. 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 Query blood transfusion history. 3. Pancreas appears normal. Small periampullary duodenum diverticulum. 4. Septated RIGHT pleural effusion with dense RIGHT lower lobe atelectasis. Electronically Signed   By: Suzy Bouchard M.D.   On: 05/30/2022 13:40    CT CHEST ABDOMEN PELVIS W CONTRAST  Addendum Date: 05/30/2022   ADDENDUM REPORT: 05/30/2022 12:22 ADDENDUM: 1.9 cm left thyroid nodule. As previously recommended, unless already performed elsewhere, thyroid ultrasound recommended.(Ref: J Am Coll Radiol. 2015 Feb;12(2): 143-50). Electronically Signed  By: Richardean Sale M.D.   On: 05/30/2022 12:22   Result Date: 05/30/2022 CLINICAL DATA:  Sepsis. Respiratory distress and weakness. Hemodialysis patient. EXAM: CT CHEST, ABDOMEN, AND PELVIS WITH CONTRAST TECHNIQUE: Multidetector CT imaging of the chest, abdomen and pelvis was performed following the standard protocol during bolus administration of intravenous contrast. RADIATION DOSE REDUCTION: This exam was performed according to the departmental dose-optimization program which includes automated exposure control, adjustment of the mA and/or kV according to patient size and/or use of iterative reconstruction technique. CONTRAST:  138m OMNIPAQUE IOHEXOL 300 MG/ML  SOLN COMPARISON:  Chest radiograph same date and 05/27/2022. CTA neck 03/21/2022. No other prior relevant CT. FINDINGS: CT CHEST FINDINGS Cardiovascular: No acute vascular findings are seen. There is diffuse atherosclerosis of the aorta, great vessels and coronary arteries. Calcifications of the aortic valve are noted. The heart is mildly enlarged. No significant pericardial fluid. Mediastinum/Nodes: Small right mediastinal and right hilar lymph nodes are noted, measuring up to 1.2 cm in the right paratracheal station on image 16/3. No other enlarged mediastinal, hilar or axillary lymph nodes. Low-density left thyroid nodule measuring 1.9 cm on image 6/3 is unchanged from previous neck CTA. The esophagus appears unremarkable. Lungs/Pleura: Moderate size complicated right pleural effusion appears loculated posterolaterally in the lower right hemithorax. There is associated pleural thickening and possible nodularity in the pleural space. Associated  compressive atelectasis of the right lower and middle lobes without obvious endobronchial lesion or central pulmonary mass. There are patchy ground-glass opacities within the aerated portions of both lungs. Musculoskeletal/Chest wall: No chest wall mass or suspicious osseous findings. Mild multilevel thoracic spondylosis. CT ABDOMEN AND PELVIS FINDINGS Hepatobiliary: The liver is normal in density without suspicious focal abnormality. Status post cholecystectomy without evidence of significant biliary dilatation. Pancreas: Unremarkable. No pancreatic ductal dilatation or surrounding inflammatory changes. Spleen: Normal in size without focal abnormality. Adrenals/Urinary Tract: No significant abnormality of the adrenal glands. Bilateral renal cortical thinning with limited parenchymal enhancement and excretion consistent with chronic renal failure. No evidence of urinary tract calculus or hydronephrosis. The bladder appears unremarkable for its degree of distention. Stomach/Bowel: No enteric contrast administered. The stomach appears unremarkable for its degree of distension. No evidence of bowel wall thickening, distention or surrounding inflammatory change. Incidental proximal duodenal diverticulum. The appendix appears normal. Mild sigmoid diverticulosis. Prominent stool in the rectum. Vascular/Lymphatic: Scattered prominent retroperitoneal lymph nodes, including a left periaortic node measuring 1.3 cm on image 86/3 and a left common iliac node measuring 0.9 cm on image 95/3. Diffuse aortic and branch vessel atherosclerosis without evidence of aneurysm or large vessel occlusion. Reproductive: Intrauterine device in place. There are small calcified uterine fibroids. No suspicious adnexal findings. Other: Small umbilical hernia containing only fat. No ascites or free air. Mild generalized soft tissue edema. Musculoskeletal: No acute or significant osseous findings. Multilevel lumbar spondylosis. Associated Schmorl's  nodes, most prominent in the L1 and L4 vertebral bodies. No suspicious osseous findings. IMPRESSION: 1. Moderate size complicated right pleural effusion with associated pleural thickening and possible pleural nodularity. The size of this effusion is similar to available prior chest radiographs, such that empyema is unlikely. A malignant effusion cannot be excluded, and there is partial collapse of the right middle and lower lobes with mildly prominent right hilar and mediastinal lymph nodes. If not previously performed, recommend further evaluation with thoracentesis. 2. Patchy ground-glass opacities within the aerated portions of both lungs, nonspecific, but potentially inflammatory. 3. No acute findings in the abdomen or pelvis. 4. Scattered prominent  retroperitoneal lymph nodes, nonspecific, but potentially reactive. 5. Chronic renal failure with limited renal parenchymal enhancement and excretion. 6. IUD in place. 7.  Aortic Atherosclerosis (ICD10-I70.0). Electronically Signed: By: Richardean Sale M.D. On: 05/30/2022 11:38   DG Chest Port 1 View  Result Date: 05/30/2022 CLINICAL DATA:  Respiratory distress EXAM: PORTABLE CHEST 1 VIEW COMPARISON:  05/27/2022 FINDINGS: Cardiomegaly. Pulmonary vascular congestion. Moderate right sided pleural effusion with associated right basilar opacity. No pneumothorax. IMPRESSION: 1. Cardiomegaly with pulmonary vascular congestion. 2. Moderate right sided pleural effusion with associated right basilar opacity, similar to the previous study. Electronically Signed   By: Davina Poke D.O.   On: 05/30/2022 08:37       Tangia Pinard M.D. [facility] 05/31/2022, 2:18 PM  Available via Epic secure chat 7am-7pm After 7 pm, please refer to night coverage provider listed on amion.

## 2022-05-31 NOTE — Consult Note (Signed)
Pharmacy Antibiotic Note  Cindy Osborne is a 56 y.o. female admitted on 05/30/2022 with  shortness of breath . PMH significant for HTN, HLD, bilateral BKA, DM< ESRD on HD TuThSa. Patient was recently admitted with N/V due to suspected viral illness (received supportive care, discharged 10/15). In the ED, patient found to have acute respiratory failure requiring nonrebreather due to possible HCAP and right moderate pleural effusion. PCT 0.43. Pharmacy has been consulted for Vancomycin and Cefepime dosing. On 10/18, pt had a thoracentesis, which the patient performed well with no complications and body fluid cx were obtained.  Plan: Day 2 of antibiotics Continue Vancomycin 1000 mg IV after HD on TuThSa. Goal level 15-25 mcg/mL. Continue Cefepime 1 g IV Q24H Continue to monitor for clinical improvement and follow culture results  Height: 5\' 9"  (175.3 cm) Weight: 99.8 kg (220 lb) IBW/kg (Calculated) : 66.2  Temp (24hrs), Avg:98.2 F (36.8 C), Min:97.5 F (36.4 C), Max:98.7 F (37.1 C)  Recent Labs  Lab 05/27/22 1649 05/27/22 1657 05/28/22 0118 05/30/22 0813 05/30/22 0813 05/30/22 1157 05/30/22 1441 05/30/22 1755 05/31/22 0200 05/31/22 0510  WBC 8.5  --   --  8.9  --   --   --   --   --  8.2  CREATININE 3.75* 3.70* 4.56* 8.24*  --   --   --   --   --  5.17*  LATICACIDVEN  --   --   --  5.9*  8.4*   < > 3.7* 3.5* 1.9 3.2* 3.1*   < > = values in this interval not displayed.     Estimated Creatinine Clearance: 15.1 mL/min (A) (by C-G formula based on SCr of 5.17 mg/dL (H)).    Allergies  Allergen Reactions   Augmentin [Amoxicillin-Pot Clavulanate] Shortness Of Breath   Atorvastatin Other (See Comments)    Back pain   Pioglitazone Other (See Comments)    Edema   Peanut-Containing Drug Products Itching   Rosuvastatin Itching and Other (See Comments)    Back pain   Shellfish-Derived Products Itching and Rash    shrimp   Sulfa Antibiotics Rash and Other (See Comments)     Shedding of skin    Antimicrobials this admission: 10/17 Metronidazole x1 10/17 Vancomycin >>  10/17 Cefepime >>  Microbiology results: 10/17 BCx: NGTD @ 1 day 10/17 UCx: ordered  10/17 Sputum: ordered  10/17 MRSA PCR: ordered 10/18 Body fluid cx: ordered  Thank you for allowing pharmacy to be a part of this patient's care.  Dara Hoyer, PharmD PGY-1 Pharmacy Resident 05/31/2022 2:49 PM

## 2022-05-31 NOTE — ED Notes (Signed)
Pt sleeping. 

## 2022-05-31 NOTE — Progress Notes (Signed)
       CROSS COVER NOTE  NAME: Cindy Osborne MRN: [id number] DOB : 1965/11/14 ATTENDING PHYSICIAN: @ATTENDING @    Date of Service   05/31/2022   HPI/Events of Note     Interventions   Assessment/Plan: Transfuse 1U PRBC X X      This document was prepared using Dragon voice recognition software and may include unintentional dictation errors.  Neomia Glass DNP, MBA, FNP-BC Nurse Practitioner [facility] Pager [phone]

## 2022-05-31 NOTE — ED Notes (Signed)
Fsbs 97  pt alert.

## 2022-05-31 NOTE — ED Notes (Signed)
Blood transfusion started   family with pt

## 2022-05-31 NOTE — ED Notes (Signed)
Patient would like a break from the blood pressure cuff.

## 2022-05-31 NOTE — ED Notes (Signed)
Resumed care from annie rn.  Pt alert.  Pt lying I bed.  Oxygen in place.  Meds infusing.

## 2022-05-31 NOTE — Telephone Encounter (Signed)
[place]  Best contact: [phone]    Pt is hospitalized with SOB, treating her for pneumonia, altered mental status, and possible Sepsis.

## 2022-05-31 NOTE — ED Notes (Addendum)
This writer went into room around 2350 to round on pt. Pt found to rip out IV and blood on pt's arm and sheets. Blood was cleaned off of pt's arm and new bed linen placed on pt's bed. IV catheter intact upon removal. Pt can answer orientation questions correctly.

## 2022-05-31 NOTE — ED Notes (Addendum)
Meds given.  Pt alert.  Oxygen in place.  Dinner meal given to pt.  Family with pt.

## 2022-05-31 NOTE — ED Notes (Signed)
Pt signed consent for blood   family with pt.

## 2022-05-31 NOTE — Procedures (Signed)
PROCEDURE SUMMARY:  Successful US guided right thoracentesis. Yielded 350 ml of blood-tinged fluid. Pt tolerated procedure well. No immediate complications.  Specimen sent for labs. CXR ordered; no post-procedure pneumothorax identified.  EBL < 2 mL  Theresa Duty, NP 05/31/2022 12:35 PM

## 2022-05-31 NOTE — Telephone Encounter (Signed)
Noted  KP 

## 2022-06-01 ENCOUNTER — Encounter: Payer: Self-pay | Admitting: Internal Medicine

## 2022-06-01 DIAGNOSIS — J9601 Acute respiratory failure with hypoxia: Secondary | ICD-10-CM | POA: Diagnosis not present

## 2022-06-01 DIAGNOSIS — N186 End stage renal disease: Secondary | ICD-10-CM | POA: Diagnosis not present

## 2022-06-01 DIAGNOSIS — R7989 Other specified abnormal findings of blood chemistry: Secondary | ICD-10-CM | POA: Diagnosis not present

## 2022-06-01 DIAGNOSIS — J189 Pneumonia, unspecified organism: Secondary | ICD-10-CM | POA: Diagnosis not present

## 2022-06-01 DIAGNOSIS — I5032 Chronic diastolic (congestive) heart failure: Secondary | ICD-10-CM

## 2022-06-01 LAB — CBC
HCT: 26.6 % — ABNORMAL LOW (ref 36.0–46.0)
Hemoglobin: 8.2 g/dL — ABNORMAL LOW (ref 12.0–15.0)
MCH: 29 pg (ref 26.0–34.0)
MCHC: 30.8 g/dL (ref 30.0–36.0)
MCV: 94 fL (ref 80.0–100.0)
Platelets: 174 10*3/uL (ref 150–400)
RBC: 2.83 MIL/uL — ABNORMAL LOW (ref 3.87–5.11)
RDW: 19.7 % — ABNORMAL HIGH (ref 11.5–15.5)
WBC: 9.1 10*3/uL (ref 4.0–10.5)
nRBC: 3.4 % — ABNORMAL HIGH (ref 0.0–0.2)

## 2022-06-01 LAB — GLUCOSE, CAPILLARY
Glucose-Capillary: 125 mg/dL — ABNORMAL HIGH (ref 70–99)
Glucose-Capillary: 186 mg/dL — ABNORMAL HIGH (ref 70–99)

## 2022-06-01 LAB — BPAM RBC
Blood Product Expiration Date: 202311202359
ISSUE DATE / TIME: 202310182137
Unit Type and Rh: 5100

## 2022-06-01 LAB — COMPREHENSIVE METABOLIC PANEL
ALT: 1288 U/L — ABNORMAL HIGH (ref 0–44)
AST: 1252 U/L — ABNORMAL HIGH (ref 15–41)
Albumin: 3 g/dL — ABNORMAL LOW (ref 3.5–5.0)
Alkaline Phosphatase: 201 U/L — ABNORMAL HIGH (ref 38–126)
Anion gap: 16 — ABNORMAL HIGH (ref 5–15)
BUN: 32 mg/dL — ABNORMAL HIGH (ref 6–20)
CO2: 26 mmol/L (ref 22–32)
Calcium: 8.9 mg/dL (ref 8.9–10.3)
Chloride: 95 mmol/L — ABNORMAL LOW (ref 98–111)
Creatinine, Ser: 6.51 mg/dL — ABNORMAL HIGH (ref 0.44–1.00)
GFR, Estimated: 7 mL/min — ABNORMAL LOW (ref >60)
Glucose, Bld: 85 mg/dL (ref 70–99)
Potassium: 3.4 mmol/L — ABNORMAL LOW (ref 3.5–5.1)
Sodium: 137 mmol/L (ref 135–145)
Total Bilirubin: 6.1 mg/dL — ABNORMAL HIGH (ref 0.3–1.2)
Total Protein: 6.9 g/dL (ref 6.5–8.1)

## 2022-06-01 LAB — CBG MONITORING, ED: Glucose-Capillary: 67 mg/dL — ABNORMAL LOW (ref 70–99)

## 2022-06-01 LAB — PROTIME-INR
INR: 2.8 — ABNORMAL HIGH (ref 0.8–1.2)
Prothrombin Time: 29.6 seconds — ABNORMAL HIGH (ref 11.4–15.2)

## 2022-06-01 LAB — TYPE AND SCREEN
ABO/RH(D): O POS
Antibody Screen: NEGATIVE
Unit division: 0

## 2022-06-01 LAB — TRIGLYCERIDES, BODY FLUIDS: Triglycerides, Fluid: 37 mg/dL

## 2022-06-01 LAB — HEMOGLOBIN A1C
Hgb A1c MFr Bld: 4.9 % (ref 4.8–5.6)
Mean Plasma Glucose: 93.93 mg/dL

## 2022-06-01 LAB — PROCALCITONIN: Procalcitonin: 4.01 ng/mL

## 2022-06-01 LAB — HEPATITIS B SURFACE ANTIGEN

## 2022-06-01 MED ORDER — LIDOCAINE-PRILOCAINE 2.5-2.5 % EX CREA: 1.0000 | TOPICAL_CREAM | CUTANEOUS | Status: DC | PRN

## 2022-06-01 MED ORDER — PENTAFLUOROPROP-TETRAFLUOROETH EX AERO
1.0000 | INHALATION_SPRAY | CUTANEOUS | Status: DC | PRN
  Filled 2022-06-01: qty 30

## 2022-06-01 MED ORDER — HEPARIN SODIUM (PORCINE) 1000 UNIT/ML DIALYSIS
1000.0000 [IU] | INTRAMUSCULAR | Status: DC | PRN
  Filled 2022-06-01: qty 1

## 2022-06-01 MED ORDER — SODIUM CHLORIDE 0.9 % IV SOLN: 1.0000 g | INTRAVENOUS | Status: DC

## 2022-06-01 MED ORDER — LIDOCAINE HCL (PF) 1 % IJ SOLN: 5.0000 mL | INTRAMUSCULAR | Status: DC | PRN

## 2022-06-01 MED ORDER — ALTEPLASE 2 MG IJ SOLR
2.0000 mg | Freq: Once | INTRAMUSCULAR | Status: DC | PRN
  Filled 2022-06-01: qty 2

## 2022-06-01 MED ORDER — ANTICOAGULANT SODIUM CITRATE 4% (200MG/5ML) IV SOLN
5.0000 mL | Status: DC | PRN
  Filled 2022-06-01: qty 5

## 2022-06-01 NOTE — ED Notes (Signed)
Pt transported to dialysis

## 2022-06-01 NOTE — ED Notes (Signed)
Blood transfusion complete   no reaction noted   family with pt.

## 2022-06-01 NOTE — ED Notes (Signed)
Pt bg 67, pt given apple  juice with sugar packets added. Pt tolerated well. Pt's lunch tray set up.

## 2022-06-01 NOTE — Progress Notes (Signed)
   06/01/22 1149  Vitals  Temp (!) 97.5 F (36.4 C)  Temp Source Axillary  BP 117/70  MAP (mmHg) 85  BP Location Right Arm  BP Method Automatic  Patient Position (if appropriate) Lying  Pulse Rate 78  Pulse Rate Source Monitor  ECG Heart Rate 79  Resp 20  Oxygen Therapy  SpO2 100 %  O2 Device Nasal Cannula  O2 Flow Rate (L/min) 3 L/min  Patient Activity (if Appropriate) In bed  Pulse Oximetry Type Continuous  Post Treatment  Dialyzer Clearance Clear  Duration of HD Treatment -hour(s) 3.25 hour(s)  Liters Processed 78  Fluid Removed 2000 mL  Tolerated HD Treatment Yes  Post-Hemodialysis Comments Tolerated HD well  AVG/AVF Arterial Site Held (minutes) 10 minutes  AVG/AVF Venous Site Held (minutes) 10 minutes   3.25Hours  HD with 2035ml fluid remove completed without complication

## 2022-06-01 NOTE — Inpatient Diabetes Management (Signed)
Inpatient Diabetes Program Recommendations  AACE/ADA: New Consensus Statement on Inpatient Glycemic Control (2015)  Target Ranges:  Prepandial:   less than 140 mg/dL      Peak postprandial:   less than 180 mg/dL (1-2 hours)      Critically ill patients:  140 - 180 mg/dL   Lab Results  Component Value Date   GLUCAP 67 (L) 06/01/2022   HGBA1C 4.9 06/01/2022    Review of Glycemic Control  Latest Reference Range & Units 05/31/22 08:00 05/31/22 12:27 05/31/22 16:29 05/31/22 21:59 06/01/22 12:38  Glucose-Capillary 70 - 99 mg/dL 81 147 (H) 215 (H) 97 67 (L)   Diabetes history: DM 2 Outpatient Diabetes medications: Tradjenta 5 mg Daily Current orders for Inpatient glycemic control:  Novolog 0-9 units tid + hs  A1c low at 4.9% on 10/19  Inpatient Diabetes Program Recommendations:    -   reduce Novolog Correction to "very sensitive" 0-6 units tid  Thanks,  Tama Headings RN, MSN, BC-ADM Inpatient Diabetes Coordinator Team Pager [phone] (8a-5p)

## 2022-06-01 NOTE — Progress Notes (Signed)
Moorea Boissonneault  MRN: [id number]  DOB/AGE: 57/04/1966 57 y.o.  Primary Care Physician:Berglund, [name], MD  Admit date: 05/30/2022  Chief Complaint:  Chief Complaint  Patient presents with   Respiratory Distress    S-Pt presented on  05/30/2022 with  Chief Complaint  Patient presents with   Respiratory Distress  .  Patient is a 56 year old African-American female with a past medical history of end-stage renal disease, patient is on hemodialysis on Tuesday Thursday Saturday schedule, hypertension, hyperlipidemia, diabetes mellitus, CVA, history of bilateral BKA, peripheral vascular disease, atrial fibrillation-on anticoagulants, diastolic CHF, anemia who came to the ER with chief complaint of shortness of breath.  History of present illness dates back to this morning when patient started feeling short of breath was progressively getting worse so she came to the ER. Upon evaluation in the ER patient was found to be hypoxic, in severe respiratory distress and was started on nonrebreather.  Patient also found to be hypotensive and was given normal saline bolus.Nephrology was consulted for comanagement of dialysis patient. Patient is well-known to our practice as patient sees Dr. Holley Raring as an outpatient  Patient was seen in the dialysis unit Patient tolerating treatment well   Medications   apixaban  5 mg Oral BID   Chlorhexidine Gluconate Cloth  6 each Topical Q0600   insulin aspart  0-5 Units Subcutaneous QHS   insulin aspart  0-9 Units Subcutaneous TID WC   midodrine  10 mg Oral TID WC         EYE:MVVKP from the symptoms mentioned above,there are no other symptoms referable to all systems reviewed.  Physical Exam: Vital signs in last 24 hours: Temp:  [97.4 F (36.3 C)-98.1 F (36.7 C)] 97.4 F (36.3 C) (10/19 0517) Pulse Rate:  [68-78] 78 (10/19 0517) Resp:  [17-20] 20 (10/19 0517) BP: (102-150)/(57-97) 121/63 (10/19 0517) SpO2:  [93 %-100 %] 99 % (10/19  0517) Weight change:     Intake/Output from previous day: 10/18 0701 - 10/19 0700 In: 390 [Blood:390] Out: -  No intake/output data recorded.   Physical Exam:  General- pt is awake,alert, oriented to time place and person  HEENT-sclera is icteric, conjunctive  is pale, head is atraumatic normocephalic  Resp no REsp distress, Decreased breath sounds at bases  CVS- S1S2 irregular in rate and rhythm  GIT- BS+, soft, Non tender , Non distended  EXT- Bilateral BKA,  No Cyanosis  Access- Left upper extremity AV fistula present  Lab Results:  CBC  Recent Labs    05/31/22 0510 06/01/22 0517  WBC 8.2 9.1  HGB 7.0* 8.2*  HCT 23.3* 26.6*  PLT 169 174    BMET  Recent Labs    05/31/22 0510 06/01/22 0517  NA 137 137  K 3.6 3.4*  CL 100 95*  CO2 25 26  GLUCOSE 91 85  BUN 20 32*  CREATININE 5.17* 6.51*  CALCIUM 8.8* 8.9      Most recent Creatinine trend  Lab Results  Component Value Date   CREATININE 6.51 (H) 06/01/2022   CREATININE 5.17 (H) 05/31/2022   CREATININE 8.24 (H) 05/30/2022      MICRO   Recent Results (from the past 240 hour(s))  Resp Panel by RT-PCR (Flu A&B, Covid) Anterior Nasal Swab     Status: None   Collection Time: 05/30/22  8:13 AM   Specimen: Anterior Nasal Swab  Result Value Ref Range Status   SARS Coronavirus 2 by RT PCR NEGATIVE NEGATIVE Final  Comment: (NOTE) SARS-CoV-2 target nucleic acids are NOT DETECTED.  The SARS-CoV-2 RNA is generally detectable in upper respiratory specimens during the acute phase of infection. The lowest concentration of SARS-CoV-2 viral copies this assay can detect is 138 copies/mL. A negative result does not preclude SARS-Cov-2 infection and should not be used as the sole basis for treatment or other patient management decisions. A negative result may occur with  improper specimen collection/handling, submission of specimen other than nasopharyngeal swab, presence of viral mutation(s) within  the areas targeted by this assay, and inadequate number of viral copies(<138 copies/mL). A negative result must be combined with clinical observations, patient history, and epidemiological information. The expected result is Negative.  Fact Sheet for Patients:  EntrepreneurPulse.com.au  Fact Sheet for Healthcare Providers:  IncredibleEmployment.be  This test is no t yet approved or cleared by the [place] FDA and  has been authorized for detection and/or diagnosis of SARS-CoV-2 by FDA under an Emergency Use Authorization (EUA). This EUA will remain  in effect (meaning this test can be used) for the duration of the COVID-19 declaration under Section 564(b)(1) of the Act, 21 U.S.C.section 360bbb-3(b)(1), unless the authorization is terminated  or revoked sooner.       Influenza A by PCR NEGATIVE NEGATIVE Final   Influenza B by PCR NEGATIVE NEGATIVE Final    Comment: (NOTE) The Xpert Xpress SARS-CoV-2/FLU/RSV plus assay is intended as an aid in the diagnosis of influenza from Nasopharyngeal swab specimens and should not be used as a sole basis for treatment. Nasal washings and aspirates are unacceptable for Xpert Xpress SARS-CoV-2/FLU/RSV testing.  Fact Sheet for Patients: EntrepreneurPulse.com.au  Fact Sheet for Healthcare Providers: IncredibleEmployment.be  This test is not yet approved or cleared by the [place] FDA and has been authorized for detection and/or diagnosis of SARS-CoV-2 by FDA under an Emergency Use Authorization (EUA). This EUA will remain in effect (meaning this test can be used) for the duration of the COVID-19 declaration under Section 564(b)(1) of the Act, 21 U.S.C. section 360bbb-3(b)(1), unless the authorization is terminated or revoked.  Performed at [facility], [address] [place], [place] [address]   Blood Culture (routine x 2)     Status: None  (Preliminary result)   Collection Time: 05/30/22  8:13 AM   Specimen: BLOOD  Result Value Ref Range Status   Specimen Description BLOOD BLOOD RIGHT ARM  Final   Special Requests   Final    BOTTLES DRAWN AEROBIC AND ANAEROBIC Blood Culture results may not be optimal due to an excessive volume of blood received in culture bottles   Culture   Final    NO GROWTH 2 DAYS Performed at [facility], [address], [place], [place] [address]    Report Status PENDING  Incomplete  Blood Culture (routine x 2)     Status: None (Preliminary result)   Collection Time: 05/30/22  8:13 AM   Specimen: BLOOD  Result Value Ref Range Status   Specimen Description BLOOD RIGHT ANTECUBITAL  Final   Special Requests   Final    BOTTLES DRAWN AEROBIC AND ANAEROBIC Blood Culture results may not be optimal due to an excessive volume of blood received in culture bottles   Culture   Final    NO GROWTH 2 DAYS Performed at [facility], [address], [place], [place] [address]    Report Status PENDING  Incomplete  Body fluid culture w Gram Stain     Status: None (Preliminary  result)   Collection Time: 05/31/22 11:50 AM   Specimen: PATH Cytology Pleural fluid  Result Value Ref Range Status   Specimen Description   Final    PLEURAL Performed at [facility], [address], [place], [place] [address]    Special Requests   Final    NONE Performed at [facility], [address] [place], [place] [address]    Gram Stain   Final    NO WBC SEEN NO ORGANISMS SEEN Performed at [facility], [place] [address], [place], [place] [address]    Culture PENDING  Incomplete   Report Status PENDING  Incomplete         Impression:   1)Renal    End-stage renal disease Patient is on hemodialysis Patient is on Tuesday Thursday Saturday schedule Patient will be dialyzed today   2)Hypotension Patient was admitted with septic shock. Patient is hemodynamically  better   3)Anemia of chronic disease     Latest Ref Rng & Units 06/01/2022    5:17 AM 05/31/2022    5:10 AM 05/30/2022    8:13 AM  CBC  WBC 4.0 - 10.5 K/uL 9.1  8.2  8.9   Hemoglobin 12.0 - 15.0 g/dL 8.2  7.0  8.4   Hematocrit 36.0 - 46.0 % 26.6  23.3  28.4   Platelets 150 - 400 K/uL 174  169  213        Patient did receive PRBC yesterday  HGb is not at goal (9--11) but better Patient is on Mircera as an outpatient   4) Secondary hyperparathyroidism -CKD Mineral-Bone Disorder    Lab Results  Component Value Date   CALCIUM 8.9 06/01/2022   CAION 0.90 (L) 05/27/2022   PHOS 3.3 05/31/2022    Patient has history of secondary parathyroidism Patient intact PTH level was at 584 as an outpatient Patient is on Hectorol and Sensipar  Phosphorus at goal.   5)Healthcare associated pneumonia/severe sepsis/septic shock Patient is on broad-spectrum antibiotics Patient is being followed by primary team, ICU team   6) Electrolytes      Latest Ref Rng & Units 06/01/2022    5:17 AM 05/31/2022    5:10 AM 05/30/2022    8:13 AM  BMP  Glucose 70 - 99 mg/dL 85  91  225   BUN 6 - 20 mg/dL 32  20  32   Creatinine 0.44 - 1.00 mg/dL 6.51  5.17  8.24   Sodium 135 - 145 mmol/L 137  137  139   Potassium 3.5 - 5.1 mmol/L 3.4  3.6  4.3   Chloride 98 - 111 mmol/L 95  100  96   CO2 22 - 32 mmol/L 26  25  21    Calcium 8.9 - 10.3 mg/dL 8.9  8.8  9.8      Sodium Normonatremic   Potassium Hypokalemia We will replete    7)Acid base   Co2 at goal  8)Pleural effusion Patient CT chest had shown moderate size complicated right pleural effusion Patient may need tap Patient is being followed by pulmonary team and primary team  9)Liver failure Patient bilirubin remains elevated at 6.1 Patient AST/ALT are elevated Primary team is following   10)History of CVA Patient has history of CVA with residual left-sided weakness Patient is clinically stable   11)Atrial  fibrillation Patient has history of atrial fibrillation Patient is on Eliquis   Plan:  Patient will be dialyzed today  Addendum Patient seen on dialysis today  Patient tolerating treatment well   Patty Lopezgarcia s [facility] 06/01/2022, 7:50 AM

## 2022-06-01 NOTE — Consult Note (Signed)
Lucilla Lame, MD [facility]  [address], [place], [place] [address] Phone: [phone] Fax : [phone]  Consultation  Referring Provider:     Dr. Tana Coast Primary Care Physician:  Glean Hess, MD Primary Gastroenterologist:    Althia Forts Reason for Consultation:     Abnormal liver enzymes  Date of Admission:  05/30/2022 Date of Consultation:  06/01/2022         HPI:   Cindy Osborne is a 56 y.o. female who was admitted on the 17th of this month with respiratory distress.  The patient has a history of hypertension hyperlipidemia bilateral BKA's end-stage renal disease who was found to have shortness of breath.  The patient had presented 3 days after being discharged from the hospital on suspicion of a viral illness with nausea and vomiting.  The patient was found to have abnormal liver enzymes with her trends over the last few days showing:  Component     Latest Ref Rng 05/28/2022 05/30/2022  Albumin     3.5 - 5.0 g/dL 3.1 (L)  3.5   Total Bilirubin     0.3 - 1.2 mg/dL 2.8 (H)  6.7 (H)   Alkaline Phosphatase     38 - 126 U/L 107  175 (H)   AST     15 - 41 U/L 30  720 (H)   ALT     0 - 44 U/L 21  407 (H)    Component     Latest Ref Rng 06/01/2022  Albumin     3.5 - 5.0 g/dL 3.0 (L)   Total Bilirubin     0.3 - 1.2 mg/dL 6.1 (H)   Alkaline Phosphatase     38 - 126 U/L 201 (H)   AST     15 - 41 U/L 1,252 (H)   ALT     0 - 44 U/L 1,288 (H)     The patient was sent for an MRCP that showed:   IMPRESSION: 1. No evidence of biliary obstruction. No intrahepatic biliary duct dilatation. The common bile duct normal caliber. 2. 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 Query blood transfusion history. 3. Pancreas appears normal. Small periampullary duodenum diverticulum. 4. Septated RIGHT pleural effusion with dense RIGHT lower lobe atelectasis.  The patient had a hepatitis B surface antigen negative  testing on 10/17 of this year.  The patient's acetaminophen level on admission was low.  The patient's INR 2 months ago was 1.3 and 2 days ago was 2.8.  Past Medical History:  Diagnosis Date   Acute ischemic right MCA stroke ([place]) 03/25/2022   Amputated toe ([place]) 01/31/2012   Right second toe distal phalanx Right great toe   Diabetes mellitus without complication ([place])    History of diabetic ulcer of foot 04/01/2014   Hyperlipidemia    Hypertension    Stroke (cerebrum) ([place]) 03/21/2022   Stroke ([place]) 01/21/2022    Past Surgical History:  Procedure Laterality Date   angioplasty politeal Left 07/2020   avg for dialysis     CHOLECYSTECTOMY     COLONOSCOPY  08/15/2011   cleared for 10 yrs   IR CT HEAD LTD  01/21/2022   IR CT HEAD LTD  03/21/2022   IR PERCUTANEOUS ART THROMBECTOMY/INFUSION INTRACRANIAL INC DIAG ANGIO  01/21/2022   IR PERCUTANEOUS ART THROMBECTOMY/INFUSION INTRACRANIAL INC DIAG ANGIO  03/21/2022   IR US GUIDE VASC ACCESS RIGHT  01/21/2022   IR US  GUIDE VASC ACCESS RIGHT  03/21/2022   LEG AMPUTATION BELOW KNEE Right 04/2019   LEG AMPUTATION BELOW KNEE Left 09/08/2020   RADIOLOGY WITH ANESTHESIA N/A 01/21/2022   Procedure: IR WITH ANESTHESIA;  Surgeon: Radiologist, Medication, MD;  Location: [facility];  Service: Radiology;  Laterality: N/A;   RADIOLOGY WITH ANESTHESIA N/A 03/21/2022   Procedure: IR WITH ANESTHESIA;  Surgeon: Radiologist, Medication, MD;  Location: [facility];  Service: Radiology;  Laterality: N/A;   TOE AMPUTATION Right 10/15/2018    Prior to Admission medications   Medication Sig Start Date End Date Taking? Authorizing Provider  acetaminophen (TYLENOL) 325 MG tablet Take 1-2 tablets (325-650 mg total) by mouth every 4 (four) hours as needed for mild pain. 03/27/22  Yes Love, Ivan Anchors, PA-C  apixaban (ELIQUIS) 5 MG TABS tablet Take 1 tablet (5 mg total) by mouth 2 (two) times daily. 03/25/22  Yes Bailey-Modzik, Delila A, NP  linagliptin (TRADJENTA) 5 MG TABS tablet Take 1  tablet (5 mg total) by mouth daily. 04/11/22  Yes Love, Ivan Anchors, PA-C  metoprolol (TOPROL-XL) 200 MG 24 hr tablet Take 1 tablet (200 mg total) by mouth daily. Take with or immediately following a meal. 01/25/22  Yes August Albino, NP  pravastatin (PRAVACHOL) 40 MG tablet Take 1 tablet (40 mg total) by mouth daily at 6 PM. 06/02/22  Yes Glean Hess, MD  cinacalcet (SENSIPAR) 30 MG tablet Take 3 tablets (90 mg total) by mouth Every Tuesday,Thursday,and Saturday with dialysis. Patient not taking: Reported on 05/30/2022 03/25/22   Bailey-Modzik, Mayo Ao A, NP  liver oil-zinc oxide (DESITIN) 40 % ointment Apply topically 2 (two) times daily. To buttocks Patient not taking: Reported on 05/27/2022 04/10/22   Bary Leriche, PA-C    Family History  Problem Relation Age of Onset   Diabetes Mother    Diabetes Sister      Social History   Tobacco Use   Smoking status: Never   Smokeless tobacco: Never  Vaping Use   Vaping Use: Never used  Substance Use Topics   Alcohol use: Yes   Drug use: Never    Allergies as of 05/30/2022 - Review Complete 05/30/2022  Allergen Reaction Noted   Augmentin [amoxicillin-pot clavulanate] Shortness Of Breath 09/14/2021   Atorvastatin Other (See Comments) 07/09/2015   Pioglitazone Other (See Comments) 07/09/2015   Peanut-containing drug products Itching 07/09/2015   Rosuvastatin Itching and Other (See Comments) 07/09/2015   Shellfish-derived products Itching and Rash 07/09/2015   Sulfa antibiotics Rash and Other (See Comments) 07/09/2015    Review of Systems:    All systems reviewed and negative except where noted in HPI.   Physical Exam:  Vital signs in last 24 hours: Temp:  [97.4 F (36.3 C)-98.1 F (36.7 C)] 97.5 F (36.4 C) (10/19 1149) Pulse Rate:  [68-80] 78 (10/19 1149) Resp:  [16-29] 20 (10/19 1149) BP: (98-150)/(57-97) 117/70 (10/19 1149) SpO2:  [93 %-100 %] 100 % (10/19 1149) Weight:  [95.5 kg-97.9 kg] 95.5 kg (10/19 1149)    General:   Pleasant, cooperative in NAD Head:  Normocephalic and atraumatic. Eyes:   No icterus.   Conjunctiva pink. PERRLA. Ears:  Normal auditory acuity. Neck:  Supple; no masses or thyroidomegaly Lungs: Respirations even and unlabored. Lungs clear to auscultation bilaterally.   No wheezes, crackles, or rhonchi.  Heart:  Regular rate and rhythm;  Without murmur, clicks, rubs or gallops Abdomen:  Soft, nondistended, nontender. Normal bowel sounds. No appreciable masses or hepatomegaly.  No rebound  or guarding.  Rectal:  Not performed. Msk:  Symmetrical without gross deformities.   Extremities: Bilateral BKA's. Neurologic:  Alert and oriented x3;  grossly normal neurologically. Skin:  Intact without significant lesions or rashes. Cervical Nodes:  No significant cervical adenopathy. Psych:  Alert and cooperative. Normal affect.  LAB RESULTS: Recent Labs    05/30/22 0813 05/31/22 0510 06/01/22 0517  WBC 8.9 8.2 9.1  HGB 8.4* 7.0* 8.2*  HCT 28.4* 23.3* 26.6*  PLT 213 169 174   BMET Recent Labs    05/30/22 0813 05/31/22 0510 06/01/22 0517  NA 139 137 137  K 4.3 3.6 3.4*  CL 96* 100 95*  CO2 21* 25 26  GLUCOSE 225* 91 85  BUN 32* 20 32*  CREATININE 8.24* 5.17* 6.51*  CALCIUM 9.8 8.8* 8.9   LFT Recent Labs    06/01/22 0517  PROT 6.9  ALBUMIN 3.0*  AST 1,252*  ALT 1,288*  ALKPHOS 201*  BILITOT 6.1*   PT/INR Recent Labs    05/30/22 0813  LABPROT 29.0*  INR 2.8*    STUDIES: US THORACENTESIS ASP PLEURAL SPACE W/IMG GUIDE  Result Date: 05/31/2022 INDICATION: Patient with a history of end-stage renal disease and heart failure presents to the ED with shortness of breath. She was found to have a right pleural effusion. Interventional radiology asked to perform a diagnostic and therapeutic thoracentesis. EXAM: ULTRASOUND GUIDED THORACENTESIS MEDICATIONS: 1% lidocaine 10 mL COMPLICATIONS: None immediate. PROCEDURE: An ultrasound guided thoracentesis was thoroughly  discussed with the patient and questions answered. The benefits, risks, alternatives and complications were also discussed. The patient understands and wishes to proceed with the procedure. Written consent was obtained. Ultrasound was performed to localize and mark an adequate pocket of fluid in the right chest. The area was then prepped and draped in the normal sterile fashion. 1% Lidocaine was used for local anesthesia. Under ultrasound guidance a 6 Fr Safe-T-Centesis catheter was introduced. Thoracentesis was performed. The catheter was removed and a dressing applied. FINDINGS: A total of approximately 350 mL of blood-tinged fluid was removed. Samples were sent to the laboratory as requested by the clinical team. IMPRESSION: Successful ultrasound guided right thoracentesis yielding 350 mL of pleural fluid. Read by: Soyla Dryer, NP Electronically Signed   By: Aletta Edouard M.D.   On: 05/31/2022 12:39   DG Chest Port 1 View  Result Date: 05/31/2022 CLINICAL DATA:  Shortness of breath. Status post thoracentesis. 350 mL drawn of right-sided. EXAM: PORTABLE CHEST 1 VIEW COMPARISON:  CT chest dated May 30, 2022 FINDINGS: The heart is mildly enlarged. Interval decrease in size of the right pleural effusion. Right basilar opacity suggesting atelectasis and/or small residual effusion. No appreciable pneumothorax. Left lung is clear. No acute osseous abnormality IMPRESSION: Interval decrease in size of the right pleural effusion status post thoracentesis. No appreciable pneumothorax. Electronically Signed   By: Keane Police D.O.   On: 05/31/2022 12:04   MR ABDOMEN MRCP WO CONTRAST  Result Date: 05/30/2022 CLINICAL DATA:  Respiratory distress and weakness. Jaundice. Elevated liver enzymes EXAM: MRI ABDOMEN WITHOUT CONTRAST  (INCLUDING MRCP) TECHNIQUE: Multiplanar multisequence MR imaging of the abdomen was performed. Heavily T2-weighted images of the biliary and pancreatic ducts were obtained, and  three-dimensional MRCP images were rendered by post processing. COMPARISON:  CT 05/30/2022 FINDINGS: Lower chest: Large septated RIGHT pleural effusion with dense atelectasis of the RIGHT lower lobe. Hepatobiliary: There is no intrahepatic biliary duct dilatation. There is loss of signal intensity on T2 weighted imaging within  liver suggesting iron deposition. This is also represented as loss of signal intensity on inphase imaging (series 6). There is no intrahepatic biliary duct dilatation. The common bile duct is normal caliber. Postcholecystectomy. Pancreas: Normal pancreatic parenchymal intensity. No ductal dilatation or inflammation. Spleen: Low signal intensity within the spleen. Adrenals/urinary tract: Adrenal glands normal. No renal obstruction. Multiple small benign appearing renal cysts. Stomach/Bowel: Stomach is normal. Periampullary duodenal diverticulum measures 1.5 cm. Limited view of the bowel is unremarkable. Vascular/Lymphatic: Abdominal aortic normal caliber. No retroperitoneal periportal lymphadenopathy. Musculoskeletal: No aggressive osseous lesion IMPRESSION: 1. No evidence of biliary obstruction. No intrahepatic biliary duct dilatation. The common bile duct normal caliber. 2. 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 Query blood transfusion history. 3. Pancreas appears normal. Small periampullary duodenum diverticulum. 4. Septated RIGHT pleural effusion with dense RIGHT lower lobe atelectasis. Electronically Signed   By: Suzy Bouchard M.D.   On: 05/30/2022 13:40   MR 3D Recon At Scanner  Result Date: 05/30/2022 CLINICAL DATA:  Respiratory distress and weakness. Jaundice. Elevated liver enzymes EXAM: MRI ABDOMEN WITHOUT CONTRAST  (INCLUDING MRCP) TECHNIQUE: Multiplanar multisequence MR imaging of the abdomen was performed. Heavily T2-weighted images of the biliary and pancreatic ducts were obtained, and three-dimensional MRCP images were  rendered by post processing. COMPARISON:  CT 05/30/2022 FINDINGS: Lower chest: Large septated RIGHT pleural effusion with dense atelectasis of the RIGHT lower lobe. Hepatobiliary: There is no intrahepatic biliary duct dilatation. There is loss of signal intensity on T2 weighted imaging within liver suggesting iron deposition. This is also represented as loss of signal intensity on inphase imaging (series 6). There is no intrahepatic biliary duct dilatation. The common bile duct is normal caliber. Postcholecystectomy. Pancreas: Normal pancreatic parenchymal intensity. No ductal dilatation or inflammation. Spleen: Low signal intensity within the spleen. Adrenals/urinary tract: Adrenal glands normal. No renal obstruction. Multiple small benign appearing renal cysts. Stomach/Bowel: Stomach is normal. Periampullary duodenal diverticulum measures 1.5 cm. Limited view of the bowel is unremarkable. Vascular/Lymphatic: Abdominal aortic normal caliber. No retroperitoneal periportal lymphadenopathy. Musculoskeletal: No aggressive osseous lesion IMPRESSION: 1. No evidence of biliary obstruction. No intrahepatic biliary duct dilatation. The common bile duct normal caliber. 2. 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 Query blood transfusion history. 3. Pancreas appears normal. Small periampullary duodenum diverticulum. 4. Septated RIGHT pleural effusion with dense RIGHT lower lobe atelectasis. Electronically Signed   By: Suzy Bouchard M.D.   On: 05/30/2022 13:40      Impression / Plan:   Assessment: Principal Problem:   HCAP (healthcare-associated pneumonia) Active Problems:   Anemia in ESRD (end-stage renal disease) ([place])   ESRD (end stage renal disease) on dialysis ([place])   Essential hypertension   PVD (peripheral vascular disease) ([place])   History of stroke with current residual effects   Severe sepsis with septic shock ([facility])   Type II diabetes mellitus with renal  manifestations ([facility])   HLD (hyperlipidemia)   Obesity with body mass index (BMI) of 30.0 to 39.9   Abnormal LFTs   Acute respiratory failure with hypoxia ([facility])   Chronic diastolic CHF (congestive heart failure) ([facility])   Thyroid nodule   Pleural effusion on right   PAF (paroxysmal atrial fibrillation) ([place])   Sepsis ([facility])   Cindy Osborne is a 56 y.o. y/o female with abnormal liver enzymes with levels over thousand.  Most commonly levels over thousand are related to ischemia, viral infection or a reaction to medication.  The patient  does take Tylenol at home but states she only takes 2 to 3 tablets in a whole day.  The patient's last INR was on October 17th and was elevated  Plan:  This patient has acute hepatitis with liver enzymes over thousand which may be related to ischemia versus viral infection versus a reaction to a medication.  The patient will have her INR rechecked.  Her most recent hepatitis B surface antigen was negative.  The hepatitis C antibody was negative in August.  I do not see a hepatitis a test and we will order that.  The patient has been explained the plan agrees with it.  Thank you for involving me in the care of this patient.      LOS: 2 days   Lucilla Lame, MD, [facility] 06/01/2022, 12:54 PM,  Pager [phone] 7am-5pm  Check AMION for 5pm -7am coverage and on weekends   Note: This dictation was prepared with Dragon dictation along with smaller phrase technology. Any transcriptional errors that result from this process are unintentional.

## 2022-06-01 NOTE — Progress Notes (Signed)
[facility]                                                                              Cindy Osborne, is a 56 y.o. female, DOB - 21-Dec-1965, KJZ:[id number] Admit date - 05/30/2022    Outpatient Primary MD for the patient is Cindy Osborne, Cindy Sans, MD  LOS - 2  days  Chief Complaint  Patient presents with   Respiratory Distress       Brief summary   Patient is a 56 year old female with history of ESRD on HD (TTS), HTN, HLD, DM, stroke, s/p of bilateral BKA, PVD, PAF on Eliquis, dCHF, anemia presented with shortness of breath.  Patient reported shortness of breath/started on the day of the admission, progressively worsening.  Associated with dry cough but no fever chills or chest pain.  She was hospitalized from 10/14 to 10/15 due to nausea and vomiting which has resolved.  Patient had dialysis on Saturday. In ED was found to be in respiratory distress, O2 sats 85% on room air, could not speak in full sentences, placed on nonrebreather.  At baseline, does not use O2.   Initially patient was hypotensive with BP 85/74, improved to 200s after 3 L normal saline bolus.  WBCs 8.9, COVID-19 negative. abnormal liver function (ALP 175, AST 720, ALT 407, total bilirubin 6.7). ABG with pH 7.49, CO2 31, O2 221. Procalcitonin 0.43  lactic acid 8.4 --. 5.9 --> 3.7.  Chest x-ray showed cardiomegaly, vascular congestion and moderate right pleural effusion Pulm critical care and nephrology was consulted    Assessment & Plan    Principal Problem: Severe sepsis with septic shock HCAP (healthcare-associated pneumonia)  -Patient met sepsis criteria at the time of admission with hypotension, hypoxia, transaminitis, lactic acidosis, source likely due to HCAP -Sepsis physiology now improving -Wean O2 as tolerated  Active Problems:   Acute respiratory failure with hypoxia ([name]), Pleural effusion on right, HCAP -CT chest abdomen and pelvis showed moderate size complicated right pleural  effusion with associated pleural thickening and possibly pleural nodularity.  Size is similar to available prior chest radiographs, such that empyema is unlikely, malignant effusion cannot be excluded -Thoracentesis on 10/18, yielded 350 mL of blood-tinged fluid, cultures negative so far -Follow urine Legionella antigen, urine strep antigen, reordered again -wean O2 as tolerated -Continue IV vancomycin, cefepime     Abnormal LFTs/acute transaminitis -Possibly due to shock liver -MRCP showed no evidence of biliary obstruction, no intrahepatic biliary duct dilatation, normal caliber CBD.  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 -AST 1252, ALT 1288, alk phos 201, GI consulted  ESRD on dialysis, TTS -Nephrology was consulted, underwent HD on 10/17 -Continue HD per schedule    Anemia in ESRD (end-stage renal disease) ([name]) -Hemoglobin 8.2, status post 1 unit packed RBCs    Essential hypertension -Hypotensive on admission, required 3 L IV fluid bolus and then HD.   -Started midodrine 10 mg 3 times daily    History of stroke with current residual effects -Continue eliquis    HLD (hyperlipidemia) -Hold pravastatin     PVD (peripheral vascular disease) ([name]) -Continue eliquis  paroxysmal A-fib -BP was low on admission, placed on midodrine -Heart rate currently controlled, not on metoprolol - continue eliquis    Type II diabetes mellitus with renal manifestations ([name]), -HD A1c 6.0  Recent Labs    05/30/22 2246 05/31/22 0800 05/31/22 1227 05/31/22 1629 05/31/22 2159 06/01/22 1238  GLUCAP 90 81 147* 215* 97 67*       Chronic diastolic CHF (congestive heart failure) (HCC) -volume management with HD -2D echo 01/2022 showed EF of 55 to 60% with G2 DD    Thyroid nodule -Follow-up with PCP, was scheduled for thyroid ultrasound as outpatient on 10/20    Obesity with body mass index (BMI) of 30.0 to 39.9 Estimated body mass index is  31.09 kg/m as calculated from the following:   Height as of this encounter: '5\' 9"'  (1.753 m).   Weight as of this encounter: 95.5 kg.  Code Status: Full CODE STATUS DVT Prophylaxis:   apixaban (ELIQUIS) tablet 5 mg   Level of Care: Level of care: Progressive Family Communication: Updated patient   Disposition Plan:      Remains inpatient appropriate: Work-up in progress, LFTs trending up   Procedures:  HD  Consultants:   Nephrology CCM  GI  Antimicrobials:   Anti-infectives (From admission, onward)    Start     Dose/Rate Route Frequency Ordered Stop   06/01/22 1200  vancomycin (VANCOCIN) IVPB 1000 mg/200 mL premix        1,000 mg 200 mL/hr over 60 Minutes Intravenous Every T-Th-Sa (Hemodialysis) 05/30/22 1503     05/31/22 1400  ceFEPIme (MAXIPIME) 1 g in sodium chloride 0.9 % 100 mL IVPB        1 g 200 mL/hr over 30 Minutes Intravenous Every 24 hours 05/30/22 1503     05/30/22 1000  metroNIDAZOLE (FLAGYL) IVPB 500 mg        500 mg 100 mL/hr over 60 Minutes Intravenous  Once 05/30/22 0959 05/30/22 1215   05/30/22 0830  vancomycin (VANCOCIN) IVPB 1000 mg/200 mL premix        1,000 mg 200 mL/hr over 60 Minutes Intravenous  Once 05/30/22 0816 05/30/22 1030   05/30/22 0830  ceFEPIme (MAXIPIME) 2 g in sodium chloride 0.9 % 100 mL IVPB        2 g 200 mL/hr over 30 Minutes Intravenous  Once 05/30/22 0816 05/30/22 0901          Medications  apixaban  5 mg Oral BID   Chlorhexidine Gluconate Cloth  6 each Topical Q0600   insulin aspart  0-5 Units Subcutaneous QHS   insulin aspart  0-9 Units Subcutaneous TID WC   midodrine  10 mg Oral TID WC      Subjective:   [name] was seen and examined today.  Seen in HD today, has RUQ abdominal pain, no acute chest pain, shortness of breath, nausea or vomiting.    Objective:   Vitals:   06/01/22 1100 06/01/22 1130 06/01/22 1145 06/01/22 1149  BP: 126/70 107/87 117/69 117/70  Pulse: 79 80 79 78  Resp: '19 19 20  20  ' Temp:    (!) 97.5 F (36.4 C)  TempSrc:    Axillary  SpO2: 100% 100% 100% 100%  Weight:    95.5 kg  Height:        Intake/Output Summary (Last 24 hours) at 06/01/2022 1332 Last data filed at 06/01/2022 1149 Gross per 24 hour  Intake 390 ml  Output 2000 ml  Net -1610 ml  Wt Readings from Last 3 Encounters:  06/01/22 95.5 kg  05/27/22 96.6 kg  05/23/22 87 kg    Physical Exam General: Alert and oriented x 3, NAD Cardiovascular: S1 S2 clear, RRR.  Respiratory: CTAB anteriorly Gastrointestinal: Soft, mild RUQ TTP , nondistended, NBS Ext: b/l BKA Neuro: no new deficits Psych: Normal affect     Data Reviewed:  I have personally reviewed following labs    CBC Lab Results  Component Value Date   WBC 9.1 06/01/2022   RBC 2.83 (L) 06/01/2022   HGB 8.2 (L) 06/01/2022   HCT 26.6 (L) 06/01/2022   MCV 94.0 06/01/2022   MCH 29.0 06/01/2022   PLT 174 06/01/2022   MCHC 30.8 06/01/2022   RDW 19.7 (H) 06/01/2022   LYMPHSABS 0.7 05/30/2022   MONOABS 0.3 05/30/2022   EOSABS 0.0 05/30/2022   BASOSABS 0.0 65/99/3570     Last metabolic panel Lab Results  Component Value Date   NA 137 06/01/2022   K 3.4 (L) 06/01/2022   CL 95 (L) 06/01/2022   CO2 26 06/01/2022   BUN 32 (H) 06/01/2022   CREATININE 6.51 (H) 06/01/2022   GLUCOSE 85 06/01/2022   GFRNONAA 7 (L) 06/01/2022   GFRAA 4 02/02/2021   CALCIUM 8.9 06/01/2022   PHOS 3.3 05/31/2022   PROT 6.9 06/01/2022   ALBUMIN 3.0 (L) 06/01/2022   LABGLOB 2.9 06/30/2020   AGRATIO 1.4 06/30/2020   BILITOT 6.1 (H) 06/01/2022   ALKPHOS 201 (H) 06/01/2022   AST 1,252 (H) 06/01/2022   ALT 1,288 (H) 06/01/2022   ANIONGAP 16 (H) 06/01/2022    CBG (last 3)  Recent Labs    05/31/22 1629 05/31/22 2159 06/01/22 1238  GLUCAP 215* 97 67*      Coagulation Profile: Recent Labs  Lab 05/30/22 0813  INR 2.8*     Radiology Studies: I have personally reviewed the imaging studies  US THORACENTESIS ASP PLEURAL SPACE  W/IMG GUIDE  Result Date: 05/31/2022 INDICATION: Patient with a history of end-stage renal disease and heart failure presents to the ED with shortness of breath. She was found to have a right pleural effusion. Interventional radiology asked to perform a diagnostic and therapeutic thoracentesis. EXAM: ULTRASOUND GUIDED THORACENTESIS MEDICATIONS: 1% lidocaine 10 mL COMPLICATIONS: None immediate. PROCEDURE: An ultrasound guided thoracentesis was thoroughly discussed with the patient and questions answered. The benefits, risks, alternatives and complications were also discussed. The patient understands and wishes to proceed with the procedure. Written consent was obtained. Ultrasound was performed to localize and mark an adequate pocket of fluid in the right chest. The area was then prepped and draped in the normal sterile fashion. 1% Lidocaine was used for local anesthesia. Under ultrasound guidance a 6 Fr Safe-T-Centesis catheter was introduced. Thoracentesis was performed. The catheter was removed and a dressing applied. FINDINGS: A total of approximately 350 mL of blood-tinged fluid was removed. Samples were sent to the laboratory as requested by the clinical team. IMPRESSION: Successful ultrasound guided right thoracentesis yielding 350 mL of pleural fluid. Read by: Soyla Dryer, NP Electronically Signed   By: Aletta Edouard M.D.   On: 05/31/2022 12:39   DG Chest Port 1 View  Result Date: 05/31/2022 CLINICAL DATA:  Shortness of breath. Status post thoracentesis. 350 mL drawn of right-sided. EXAM: PORTABLE CHEST 1 VIEW COMPARISON:  CT chest dated May 30, 2022 FINDINGS: The heart is mildly enlarged. Interval decrease in size of the right pleural effusion. Right basilar opacity suggesting atelectasis and/or small residual effusion. No  appreciable pneumothorax. Left lung is clear. No acute osseous abnormality IMPRESSION: Interval decrease in size of the right pleural effusion status post thoracentesis.  No appreciable pneumothorax. Electronically Signed   By: Keane Police D.O.   On: 05/31/2022 12:04       Rodd Heft M.D. [facility] 06/01/2022, 1:32 PM  Available via Epic secure chat 7am-7pm After 7 pm, please refer to night coverage provider listed on amion.

## 2022-06-01 NOTE — ED Notes (Signed)
Pt sleeping   family with pt   

## 2022-06-01 NOTE — ED Notes (Signed)
Pt sleeping. 

## 2022-06-01 NOTE — Consult Note (Signed)
Pharmacy Antibiotic Note  Cindy Osborne is a 56 y.o. female admitted on 05/30/2022 with  shortness of breath . PMH significant for HTN, HLD, bilateral BKA, DM, ESRD-HD (TTS). Patient was recently admitted with N/V due to suspected viral illness (received supportive care, discharged 10/15). In the ED, patient found to have acute respiratory failure requiring nonrebreather due to possible HCAP and right moderate pleural effusion. PCT 0.43. Pharmacy has been consulted for Vancomycin and Cefepime dosing. On 10/18, pt had a thoracentesis, which the patient performed well with no complications and body fluid cx were obtained.  Plan: Day 3 of antibiotics Continue Vancomycin 1000 mg IV after HD on TuThSa. Goal level 15-25 mcg/mL. Pt scheduled for dialysis today, 10/19 Continue Cefepime 1 g IV q24H Continue to monitor for clinical improvement and follow culture results  Height: 5\' 9"  (175.3 cm) Weight: 97.9 kg (215 lb 13.3 oz) IBW/kg (Calculated) : 66.2  Temp (24hrs), Avg:97.8 F (36.6 C), Min:97.4 F (36.3 C), Max:98.1 F (36.7 C)  Recent Labs  Lab 05/27/22 1649 05/27/22 1657 05/28/22 0118 05/30/22 0813 05/30/22 0813 05/30/22 1157 05/30/22 1441 05/30/22 1755 05/31/22 0200 05/31/22 0510 06/01/22 0517  WBC 8.5  --   --  8.9  --   --   --   --   --  8.2 9.1  CREATININE 3.75* 3.70* 4.56* 8.24*  --   --   --   --   --  5.17* 6.51*  LATICACIDVEN  --   --   --  5.9*  8.4*   < > 3.7* 3.5* 1.9 3.2* 3.1*  --    < > = values in this interval not displayed.     Estimated Creatinine Clearance: 11.9 mL/min (A) (by C-G formula based on SCr of 6.51 mg/dL (H)).    Allergies  Allergen Reactions   Augmentin [Amoxicillin-Pot Clavulanate] Shortness Of Breath   Atorvastatin Other (See Comments)    Back pain   Pioglitazone Other (See Comments)    Edema   Peanut-Containing Drug Products Itching   Rosuvastatin Itching and Other (See Comments)    Back pain   Shellfish-Derived Products Itching  and Rash    shrimp   Sulfa Antibiotics Rash and Other (See Comments)    Shedding of skin    Antimicrobials this admission: 10/17 Metronidazole x1 10/17 Vancomycin >>  10/17 Cefepime >>  Microbiology results: 10/17 BCx: NGTD @ 2 days 10/17 UCx: ordered  10/17 Sputum: ordered  10/17 MRSA PCR: ordered 10/18 Body fluid cx: ordered  Thank you for allowing pharmacy to be a part of this patient's care.  Dara Hoyer, PharmD PGY-1 Pharmacy Resident 06/01/2022 11:23 AM

## 2022-06-01 NOTE — Care Management Important Message (Signed)
Important Message  Patient Details  Name: Cindy Osborne MRN: [id number] Date of Birth: Mar 21, 1966   Medicare Important Message Given:  N/A - LOS <3 / Initial given by admissions     Dannette Barbara 06/01/2022, 6:32 PM

## 2022-06-02 ENCOUNTER — Ambulatory Visit: Payer: Medicare Other

## 2022-06-02 ENCOUNTER — Ambulatory Visit
Admission: RE | Admit: 2022-06-02 | Discharge: 2022-06-02 | Disposition: A | Discharge status: Home / Self Care | Payer: Medicare Other | Source: Ambulatory Visit | Attending: Internal Medicine | Admitting: Internal Medicine

## 2022-06-02 DIAGNOSIS — N186 End stage renal disease: Secondary | ICD-10-CM | POA: Diagnosis not present

## 2022-06-02 DIAGNOSIS — J189 Pneumonia, unspecified organism: Secondary | ICD-10-CM | POA: Diagnosis not present

## 2022-06-02 DIAGNOSIS — R7989 Other specified abnormal findings of blood chemistry: Secondary | ICD-10-CM | POA: Diagnosis not present

## 2022-06-02 DIAGNOSIS — J9601 Acute respiratory failure with hypoxia: Secondary | ICD-10-CM | POA: Diagnosis not present

## 2022-06-02 LAB — COMPREHENSIVE METABOLIC PANEL
ALT: UNDETERMINED U/L (ref 0–44)
AST: 358 U/L — ABNORMAL HIGH (ref 15–41)
Albumin: 2.8 g/dL — ABNORMAL LOW (ref 3.5–5.0)
Alkaline Phosphatase: 171 U/L — ABNORMAL HIGH (ref 38–126)
Anion gap: 16 — ABNORMAL HIGH (ref 5–15)
BUN: 25 mg/dL — ABNORMAL HIGH (ref 6–20)
CO2: 24 mmol/L (ref 22–32)
Calcium: 8.7 mg/dL — ABNORMAL LOW (ref 8.9–10.3)
Chloride: 94 mmol/L — ABNORMAL LOW (ref 98–111)
Creatinine, Ser: 5.01 mg/dL — ABNORMAL HIGH (ref 0.44–1.00)
GFR, Estimated: 10 mL/min — ABNORMAL LOW (ref >60)
Glucose, Bld: 120 mg/dL — ABNORMAL HIGH (ref 70–99)
Potassium: 3.4 mmol/L — ABNORMAL LOW (ref 3.5–5.1)
Sodium: 134 mmol/L — ABNORMAL LOW (ref 135–145)
Total Bilirubin: 3.8 mg/dL — ABNORMAL HIGH (ref 0.3–1.2)
Total Protein: 6.5 g/dL (ref 6.5–8.1)

## 2022-06-02 LAB — HEPATITIS PANEL, ACUTE: Hep A IgM: NEGATIVE — AB

## 2022-06-02 LAB — GLUCOSE, CAPILLARY
Glucose-Capillary: 118 mg/dL — ABNORMAL HIGH (ref 70–99)
Glucose-Capillary: 155 mg/dL — ABNORMAL HIGH (ref 70–99)
Glucose-Capillary: 119 mg/dL — ABNORMAL HIGH (ref 70–99)
Glucose-Capillary: 127 mg/dL — ABNORMAL HIGH (ref 70–99)

## 2022-06-02 LAB — CBC WITH DIFFERENTIAL/PLATELET
Abs Immature Granulocytes: 0.08 10*3/uL — ABNORMAL HIGH (ref 0.00–0.07)
Basophils Absolute: 0 10*3/uL (ref 0.0–0.1)
Basophils Relative: 0 %
Eosinophils Absolute: 0.1 10*3/uL (ref 0.0–0.5)
Eosinophils Relative: 2 %
HCT: 29.4 % — ABNORMAL LOW (ref 36.0–46.0)
Hemoglobin: 9.3 g/dL — ABNORMAL LOW (ref 12.0–15.0)
Immature Granulocytes: 1 %
Lymphocytes Relative: 15 %
Lymphs Abs: 1.2 10*3/uL (ref 0.7–4.0)
MCH: 29.3 pg (ref 26.0–34.0)
MCHC: 31.6 g/dL (ref 30.0–36.0)
MCV: 92.7 fL (ref 80.0–100.0)
Monocytes Absolute: 0.5 10*3/uL (ref 0.1–1.0)
Monocytes Relative: 6 %
Neutro Abs: 6.1 10*3/uL (ref 1.7–7.7)
Neutrophils Relative %: 76 %
Platelets: 169 10*3/uL (ref 150–400)
RBC: 3.17 MIL/uL — ABNORMAL LOW (ref 3.87–5.11)
RDW: 19.5 % — ABNORMAL HIGH (ref 11.5–15.5)
WBC: 8.1 10*3/uL (ref 4.0–10.5)
nRBC: 2.4 % — ABNORMAL HIGH (ref 0.0–0.2)

## 2022-06-02 LAB — CYTOLOGY - NON PAP

## 2022-06-02 LAB — HEPATITIS B SURFACE ANTIBODY, QUANTITATIVE
Hep B S AB Quant (Post): 310.1 m[IU]/mL (ref >9.9)
Hep B S AB Quant (Post): 224 m[IU]/mL (ref >9.9)

## 2022-06-02 LAB — HCV INTERPRETATION

## 2022-06-02 LAB — HEPATITIS B SURFACE ANTIGEN

## 2022-06-02 LAB — HEPATITIS B CORE ANTIBODY, IGM

## 2022-06-02 LAB — HEPATITIS B CORE ANTIBODY, TOTAL

## 2022-06-02 MED ORDER — INSULIN ASPART 100 UNIT/ML IJ SOLN: 0.0000 [IU] | Freq: Three times a day (TID) | INTRAMUSCULAR | Status: DC

## 2022-06-02 MED ORDER — SODIUM CHLORIDE 0.9 % IV SOLN
100.0000 mg | Freq: Two times a day (BID) | INTRAVENOUS | Status: DC
  Administered 2022-06-02 – 2022-06-03 (×2): 100 mg via INTRAVENOUS
  Filled 2022-06-02 (×3): qty 100

## 2022-06-02 MED ORDER — POTASSIUM CHLORIDE CRYS ER 20 MEQ PO TBCR
40.0000 meq | EXTENDED_RELEASE_TABLET | Freq: Once | ORAL | Status: AC
  Administered 2022-06-02: 40 meq via ORAL
  Filled 2022-06-02: qty 2

## 2022-06-02 MED ORDER — SODIUM CHLORIDE 0.9 % IV SOLN
2.0000 g | INTRAVENOUS | Status: DC
  Administered 2022-06-02: 2 g via INTRAVENOUS
  Filled 2022-06-02 (×2): qty 20

## 2022-06-02 NOTE — Evaluation (Signed)
Occupational Therapy Evaluation Patient Details Name: Cindy Osborne MRN: [id number] DOB: 01/29/1966 Today's Date: 06/02/2022   History of Present Illness Patient is a 56 year old female with history of ESRD on HD (TTS), HTN, HLD, DM, stroke, s/p of bilateral BKA, PVD, PAF on Eliquis, dCHF, anemia presented with shortness of breath.   Clinical Impression   Pt was seen for OT evaluation this date. Prior to hospital admission, pt was living with family and requiring assist for ADL and all mobility from family since recent CVA. Pt presents to acute OT demonstrating impaired ADL performance and functional mobility 2/2 residual L side weakness, coordination, L inattention, and impaired balance and strength globally (See OT problem list). Pt currently requires MIN A for UB/LB ADL, anticipate MOD A for ADL transfers with RW (pt declined mobility with OT), and increased processing time with simple commands. Pt educated in role of acute OT, d/c recs, positioning of LUE to improve comfort and joint alignment. Pt verbalized understanding. Pt would benefit from skilled OT services to address noted impairments and functional limitations (see below for any additional details) in order to maximize safety and independence while minimizing falls risk and caregiver burden. Upon hospital discharge, recommend HHOT to maximize pt safety and return to functional independence during meaningful occupations of daily life.      Recommendations for follow up therapy are one component of a multi-disciplinary discharge planning process, led by the attending physician.  Recommendations may be updated based on patient status, additional functional criteria and insurance authorization.   Follow Up Recommendations  Home health OT    Assistance Recommended at Discharge Frequent or constant Supervision/Assistance  Patient can return home with the following A little help with walking and/or transfers;A little help with  bathing/dressing/bathroom;Assistance with cooking/housework;Assist for transportation;Help with stairs or ramp for entrance    Functional Status Assessment  Patient has had a recent decline in their functional status and demonstrates the ability to make significant improvements in function in a reasonable and predictable amount of time.  Equipment Recommendations  None recommended by OT    Recommendations for Other Services       Precautions / Restrictions Precautions Precautions: Fall Precaution Comments: B BKA. B prosthetics. Restrictions Weight Bearing Restrictions: No      Mobility Bed Mobility               General bed mobility comments: NT up in recliner    Transfers                   General transfer comment: Pt declined. Required MOD A with PT in previous session      Balance Overall balance assessment: Needs assistance Sitting-balance support: No upper extremity supported, Feet supported Sitting balance-Leahy Scale: Fair                                     ADL either performed or assessed with clinical judgement   ADL                                         General ADL Comments: Pt currently requires MIN A for LB ADL and UB ADL tasks from seated position. Anticipate MOD A for ADL transfers wiht RW.     Vision         Perception  Praxis      Pertinent Vitals/Pain Pain Assessment Pain Assessment: No/denies pain     Hand Dominance Right   Extremity/Trunk Assessment Upper Extremity Assessment Upper Extremity Assessment: LUE deficits/detail LUE Deficits / Details: L hand weakness, inattention, poor grip strength. Grossly 3/5. LUE Coordination: decreased fine motor;decreased gross motor   Lower Extremity Assessment Lower Extremity Assessment: RLE deficits/detail;LLE deficits/detail RLE Deficits / Details: BKA LLE Deficits / Details: BKA and hx of Lsided weakness from R MCA       Communication  Communication Communication: No difficulties   Cognition Arousal/Alertness: Awake/alert Behavior During Therapy: WFL for tasks assessed/performed Overall Cognitive Status: No family/caregiver present to determine baseline cognitive functioning                                 General Comments: increased processing time     General Comments       Exercises Other Exercises Other Exercises: Pt educated in role of acute OT, d/c recs, positioning of LUE to improve comfort and joint alignment   Shoulder Instructions      Home Living Family/patient expects to be discharged to:: Private residence Living Arrangements: Other relatives Available Help at Discharge: Family;Available 24 hours/day Type of Home: House Home Access: Ramped entrance     Home Layout: One level     Bathroom Shower/Tub: Teacher, early years/pre: Standard Bathroom Accessibility: Yes   Home Equipment: Conservation officer, nature (2 wheels);Cane - single point;Tub bench;Wheelchair - manual;Grab bars - tub/shower;BSC/3in1;Adaptive equipment;Hospital bed;Other (comment) (hoyer lift)   Additional Comments: Bariatric BSC      Prior Functioning/Environment Prior Level of Function : Needs assist       Physical Assist : Mobility (physical) Mobility (physical): Transfers;Gait   Mobility Comments: uses a RW with her bil prosthetics. Has only walked with her [facility] therapist. Mainly step pivot trnasfers with family. ADLs Comments: Pt reports that since CVA pt requires some PRN assist for most ADL from family and assist for IADL        OT Problem List: Decreased strength;Decreased coordination;Impaired balance (sitting and/or standing);Decreased knowledge of use of DME or AE;Impaired UE functional use;Decreased activity tolerance      OT Treatment/Interventions: Self-care/ADL training;Therapeutic exercise;Therapeutic activities;Neuromuscular education;DME and/or AE instruction;Patient/family  education;Balance training    OT Goals(Current goals can be found in the care plan section) Acute Rehab OT Goals Patient Stated Goal: get better and go back home with family OT Goal Formulation: With patient Time For Goal Achievement: 06/16/22 Potential to Achieve Goals: Good ADL Goals Pt Will Perform Lower Body Dressing: with modified independence;sit to/from stand Additional ADL Goal #1: Pt will complete all aspects of bathing from seated position with remote supervision for safety, 2/2 opportunities. Additional ADL Goal #2: Pt will verbalize plan to implement at least 2 learned falls prevention strategies.  OT Frequency: Min 2X/week    Co-evaluation              AM-PAC OT "6 Clicks" Daily Activity     Outcome Measure Help from another person eating meals?: None Help from another person taking care of personal grooming?: A Little Help from another person toileting, which includes using toliet, bedpan, or urinal?: A Little Help from another person bathing (including washing, rinsing, drying)?: A Little Help from another person to put on and taking off regular upper body clothing?: A Little Help from another person to put on and taking off regular lower body  clothing?: A Little 6 Click Score: 19   End of Session Equipment Utilized During Treatment: Oxygen  Activity Tolerance: Patient tolerated treatment well Patient left: in chair;with call bell/phone within reach;with chair alarm set  OT Visit Diagnosis: Other abnormalities of gait and mobility (R26.89);Muscle weakness (generalized) (M62.81)                Time: 3606-7703 OT Time Calculation (min): 13 min Charges:  OT General Charges $OT Visit: 1 Visit OT Evaluation $OT Eval Low Complexity: 1 Low  Ardeth Perfect., MPH, MS, OTR/L ascom [phone] 06/02/22, 3:32 PM

## 2022-06-02 NOTE — Progress Notes (Signed)
Established hemodialysis patient known at [facility] TTS 11:15am. Patient stated not dialysis concerns. Clinic is aware of admission. Will continue to follow for dialysis discharge planning.

## 2022-06-02 NOTE — Progress Notes (Signed)
The patient liver enzymes are rapidly coming down consistent with an acute insult.  What of the insult was it seems to be resolving.  The patient's hepatitis a IgM was negative as was the hepatitis C.  Since the patient's labs are coming down there is nothing further to do from a GI point of view. If the patient's go up again please do not hesitate to reconsult GI.  I will sign off.  Please call if any further GI concerns or questions.  We would like to thank you for the opportunity to participate in the care of [facility].

## 2022-06-02 NOTE — Progress Notes (Signed)
[facility]                                                                              Cindy Osborne, is a 56 y.o. female, DOB - [birth year], ONG:[id number] Admit date - 05/30/2022    Outpatient Primary MD for the patient is Cindy Osborne, Cindy Sans, MD  LOS - 3  days  Chief Complaint  Patient presents with   Respiratory Distress       Brief summary   Patient is a 56 year old female with history of ESRD on HD (TTS), HTN, HLD, DM, stroke, s/p of bilateral BKA, PVD, PAF on Eliquis, dCHF, anemia presented with shortness of breath.  Patient reported shortness of breath/started on the day of the admission, progressively worsening.  Associated with dry cough but no fever chills or chest pain.  She was hospitalized from 10/14 to 10/15 due to nausea and vomiting which has resolved.  Patient had dialysis on Saturday. In ED was found to be in respiratory distress, O2 sats 85% on room air, could not speak in full sentences, placed on nonrebreather.  At baseline, does not use O2.   Initially patient was hypotensive with BP 85/74, improved to 200s after 3 L normal saline bolus.  WBCs 8.9, COVID-19 negative. abnormal liver function (ALP 175, AST 720, ALT 407, total bilirubin 6.7). ABG with pH 7.49, CO2 31, O2 221. Procalcitonin 0.43  lactic acid 8.4 --. 5.9 --> 3.7.  Chest x-ray showed cardiomegaly, vascular congestion and moderate right pleural effusion Pulm critical care and nephrology was consulted    Assessment & Plan    Principal Problem: Severe sepsis with septic shock HCAP (healthcare-associated pneumonia)  -Patient met sepsis criteria at the time of admission with hypotension, hypoxia, transaminitis, lactic acidosis, source likely due to HCAP -Sepsis physiology resolved -O2 sats 97% on 2 L, wean O2 as tolerated  Active Problems:   Acute respiratory failure with hypoxia ([place]), Pleural effusion on right, HCAP -CT chest abdomen and pelvis showed moderate size  complicated right pleural effusion with associated pleural thickening and possibly pleural nodularity.  Size is similar to available prior chest radiographs, such that empyema is unlikely, malignant effusion cannot be excluded -Thoracentesis on 10/18, yielded 350 mL of blood-tinged fluid, cultures negative so far -urine Legionella antigen, urine strep antigen not collected -wean O2 as tolerated -Patient was placed on IV vancomycin and cefepime, narrowed to IV Rocephin, doxycycline     Abnormal LFTs/acute transaminitis -Possibly due to shock liver -MRCP showed no evidence of biliary obstruction, no intrahepatic biliary duct dilatation, normal caliber CBD.  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 -AST 1252, ALT 1288, alk phos 201, GI consulted -LFTs now rapidly trending down, likely acute liver insult possibly shock liver -Hepatitis A, B and C negative  ESRD on dialysis, TTS -Nephrology was consulted, underwent HD on 10/17 -Continue HD per schedule -HD in a.m., possibly DC home after HD    Anemia in ESRD (end-stage renal disease) (HCC) -H&H improving, 9.3  s/p 1 unit packed RBCs on 10/18    Essential hypertension -Hypotensive on admission, required 3 L IV fluid bolus and then  HD.   -Started midodrine 10 mg 3 times daily    History of stroke with current residual effects -Continue eliquis    HLD (hyperlipidemia) -Hold pravastatin     PVD (peripheral vascular disease) ([place]) -Continue eliquis   paroxysmal A-fib -BP was low on admission, placed on midodrine -Heart rate currently controlled, not on metoprolol - continue eliquis    Type II diabetes mellitus with renal manifestations ([place]), -HD A1c 6.0 Recent Labs    05/31/22 2159 06/01/22 1238 06/01/22 1730 06/01/22 2135 06/02/22 0808 06/02/22 1232  GLUCAP 97 67* 125* 186* 118* 155*   - Change to very sensitive SSI    Chronic diastolic CHF (congestive heart failure)  (HCC) -volume management with HD -2D echo 01/2022 showed EF of 55 to 60% with G2 DD    Thyroid nodule -Follow-up with PCP, was scheduled for thyroid ultrasound as outpatient on 10/20    Obesity with body mass index (BMI) of 30.0 to 39.9 Estimated body mass index is 31.22 kg/m as calculated from the following:   Height as of this encounter: '5\' 9"'  (1.753 m).   Weight as of this encounter: 95.9 kg.  Code Status: Full CODE STATUS DVT Prophylaxis:   apixaban (ELIQUIS) tablet 5 mg   Level of Care: Level of care: Progressive Family Communication: Updated patient   Disposition Plan:      Remains inpatient appropriate: Hopefully DC home in a.m. after HD   Procedures:  HD  Consultants:   Nephrology CCM  GI  Antimicrobials:   Anti-infectives (From admission, onward)    Start     Dose/Rate Route Frequency Ordered Stop   06/02/22 2000  ceFEPIme (MAXIPIME) 1 g in sodium chloride 0.9 % 100 mL IVPB  Status:  Discontinued        1 g 200 mL/hr over 30 Minutes Intravenous Every 24 hours 06/01/22 1832 06/02/22 1034   06/02/22 1130  cefTRIAXone (ROCEPHIN) 2 g in sodium chloride 0.9 % 100 mL IVPB        2 g 200 mL/hr over 30 Minutes Intravenous Every 24 hours 06/02/22 1034 06/04/22 1129   06/02/22 1130  doxycycline (VIBRAMYCIN) 100 mg in sodium chloride 0.9 % 250 mL IVPB        100 mg 125 mL/hr over 120 Minutes Intravenous Every 12 hours 06/02/22 1034 06/04/22 1129   06/01/22 1200  vancomycin (VANCOCIN) IVPB 1000 mg/200 mL premix  Status:  Discontinued        1,000 mg 200 mL/hr over 60 Minutes Intravenous Every T-Th-Sa (Hemodialysis) 05/30/22 1503 06/02/22 1034   05/31/22 1400  ceFEPIme (MAXIPIME) 1 g in sodium chloride 0.9 % 100 mL IVPB  Status:  Discontinued        1 g 200 mL/hr over 30 Minutes Intravenous Every 24 hours 05/30/22 1503 06/01/22 1832   05/30/22 1000  metroNIDAZOLE (FLAGYL) IVPB 500 mg        500 mg 100 mL/hr over 60 Minutes Intravenous  Once 05/30/22 0959 05/30/22 1215    05/30/22 0830  vancomycin (VANCOCIN) IVPB 1000 mg/200 mL premix        1,000 mg 200 mL/hr over 60 Minutes Intravenous  Once 05/30/22 0816 05/30/22 1030   05/30/22 0830  ceFEPIme (MAXIPIME) 2 g in sodium chloride 0.9 % 100 mL IVPB        2 g 200 mL/hr over 30 Minutes Intravenous  Once 05/30/22 0816 05/30/22 0901          Medications  apixaban  5 mg Oral  BID   Chlorhexidine Gluconate Cloth  6 each Topical Q0600   insulin aspart  0-5 Units Subcutaneous QHS   insulin aspart  0-9 Units Subcutaneous TID WC   midodrine  10 mg Oral TID WC      Subjective:   Corinne Goucher was seen and examined today.  Doing a lot better today, no acute complaints.  No chest pain, shortness of breath, nausea or vomiting.  Abdominal pain has improved.   Objective:   Vitals:   06/02/22 0429 06/02/22 0430 06/02/22 0756 06/02/22 1231  BP: 132/77  129/74 (!) 126/98  Pulse: 82  86 89  Resp: '19  20 19  ' Temp: 97.8 F (36.6 C)  98 F (36.7 C) 98.2 F (36.8 C)  TempSrc:    Oral  SpO2: 100%  95% 97%  Weight:  95.9 kg    Height:        Intake/Output Summary (Last 24 hours) at 06/02/2022 1344 Last data filed at 06/02/2022 1032 Gross per 24 hour  Intake 560.55 ml  Output --  Net 560.55 ml     Wt Readings from Last 3 Encounters:  06/02/22 95.9 kg  05/27/22 96.6 kg  05/23/22 87 kg    Physical Exam General: Alert and oriented x 3, NAD Cardiovascular: S1 S2 clear, RRR.  Respiratory: CTAB, no wheezing, rales or rhonchi Gastrointestinal: Soft, nontender, nondistended, NBS Ext: bilateral BKA Neuro: no new deficits Psych: Normal affect and demeanor,    Data Reviewed:  I have personally reviewed following labs    CBC Lab Results  Component Value Date   WBC 8.1 06/02/2022   RBC 3.17 (L) 06/02/2022   HGB 9.3 (L) 06/02/2022   HCT 29.4 (L) 06/02/2022   MCV 92.7 06/02/2022   MCH 29.3 06/02/2022   PLT 169 06/02/2022   MCHC 31.6 06/02/2022   RDW 19.5 (H) 06/02/2022   LYMPHSABS 1.2  06/02/2022   MONOABS 0.5 06/02/2022   EOSABS 0.1 06/02/2022   BASOSABS 0.0 40/98/1191     Last metabolic panel Lab Results  Component Value Date   NA 134 (L) 06/02/2022   K 3.4 (L) 06/02/2022   CL 94 (L) 06/02/2022   CO2 24 06/02/2022   BUN 25 (H) 06/02/2022   CREATININE 5.01 (H) 06/02/2022   GLUCOSE 120 (H) 06/02/2022   GFRNONAA 10 (L) 06/02/2022   GFRAA 4 02/02/2021   CALCIUM 8.7 (L) 06/02/2022   PHOS 3.3 05/31/2022   PROT 6.5 06/02/2022   ALBUMIN 2.8 (L) 06/02/2022   LABGLOB 2.9 06/30/2020   AGRATIO 1.4 06/30/2020   BILITOT 3.8 (H) 06/02/2022   ALKPHOS 171 (H) 06/02/2022   AST 358 (H) 06/02/2022   ALT QUANTITY NOT SUFFICIENT, UNABLE TO PERFORM TEST 06/02/2022   ANIONGAP 16 (H) 06/02/2022    CBG (last 3)  Recent Labs    06/01/22 2135 06/02/22 0808 06/02/22 1232  GLUCAP 186* 118* 155*      Coagulation Profile: Recent Labs  Lab 05/30/22 0813 06/01/22 1639  INR 2.8* 2.8*     Radiology Studies: I have personally reviewed the imaging studies  No results found.     Estill Cotta M.D. [facility] 06/02/2022, 1:44 PM  Available via Epic secure chat 7am-7pm After 7 pm, please refer to night coverage provider listed on amion.

## 2022-06-02 NOTE — Evaluation (Signed)
Physical Therapy Evaluation Patient Details Name: Cindy Osborne MRN: [id number] DOB: Apr 11, 1966 Today's Date: 06/02/2022  History of Present Illness  Patient is a 56 year old female with history of ESRD on HD (TTS), HTN, HLD, DM, stroke, s/p of bilateral BKA, PVD, PAF on Eliquis, dCHF, anemia presented with shortness of breath.   Clinical Impression  Pt admitted with above diagnosis. Pt received supine in bed with O2 doffed. Pt reports it fell off while asleep. Agreeable to PT eval. On RA SPO2 at 92$ but as pt talks, desat to 88%. Pt placed back on 2L/min returning to > 90%. Pt reports since being home from eval, She only walks with [facility] therapist with B prosthetics and RW. Mainly just transfers using metal frame of hoyer lift with family with step transfers. Indep with PRN assist from mother for ADL's/IADL's.    To date, pt able to transfer to EOB with bed features and increased time with min VC's for LLE. Pt required assist donning socks, silicon liner and set up assist situating B prosthetics. Pt able to perform knee and hip extension to connect lock pins. Pt able to stand to RW with minguard and bed elevated. Noted L sided inattention with pre requiring set up placement of LUE and VC's for monitoring LUE placement. Pt able to stand and step pivot to [facility] with poor eccentric control. Indep urination with indep perihygiene with x3 bouts of momentum to stand and modA with dependent placement of LUE. Once standing pt able to amb 2' to recliner with safe use of RW at minguard level. Pt placed in recliner. Educated on placement of BKA's to prevent contracture, bringing in prosthetic socks, and safe techniques for home transfers with family. Pt understanding. Pt currently with functional limitations due to the deficits listed below (see PT Problem List). Pt will benefit from skilled PT to increase their independence and safety with mobility to allow discharge to the venue listed below.          Recommendations for follow up therapy are one component of a multi-disciplinary discharge planning process, led by the attending physician.  Recommendations may be updated based on patient status, additional functional criteria and insurance authorization.  Follow Up Recommendations Home health PT      Assistance Recommended at Discharge Frequent or constant Supervision/Assistance  Patient can return home with the following  A little help with walking and/or transfers;A little help with bathing/dressing/bathroom;Assistance with cooking/housework;Assist for transportation;Help with stairs or ramp for entrance    Equipment Recommendations None recommended by PT  Recommendations for Other Services       Functional Status Assessment Patient has had a recent decline in their functional status and demonstrates the ability to make significant improvements in function in a reasonable and predictable amount of time.     Precautions / Restrictions Precautions Precautions: Fall Precaution Comments: B BKA. B prosthetics. Restrictions Weight Bearing Restrictions: No      Mobility  Bed Mobility Overal bed mobility: Needs Assistance Bed Mobility: Supine to Sit     Supine to sit: Supervision, HOB elevated     General bed mobility comments: increased time, min multimodal cues Patient Response: Cooperative  Transfers Overall transfer level: Needs assistance Equipment used: Rolling walker (2 wheels) Transfers: Bed to chair/wheelchair/BSC     Step pivot transfers: Min guard, Mod assist, From elevated surface       General transfer comment: minguard to stand from elevated bed. ModA and momentum to stand from low BSC. Requires LUE placement due to  LUE weakness and inattention.    Ambulation/Gait   Gait Distance (Feet): 2 Feet Assistive device: Rolling walker (2 wheels) Gait Pattern/deviations: Step-to pattern       General Gait Details: Took forwards and lateral steps with RW  from [facility] to recliner.  Stairs            Wheelchair Mobility    Modified Rankin (Stroke Patients Only)       Balance                                             Pertinent Vitals/Pain Pain Assessment Pain Assessment: No/denies pain    Home Living Family/patient expects to be discharged to:: Private residence Living Arrangements: Other relatives Available Help at Discharge: Family;Available 24 hours/day Type of Home: House Home Access: Ramped entrance       Home Layout: One level Home Equipment: Conservation officer, nature (2 wheels);Cane - single point;Tub bench;Wheelchair - manual;Grab bars - tub/shower;BSC/3in1;Adaptive equipment;Hospital bed;Other (comment) (hoyer lift) Additional Comments: Bariatric BSC    Prior Function Prior Level of Function : Needs assist       Physical Assist : Mobility (physical) Mobility (physical): Transfers;Gait   Mobility Comments: uses a RW with her bil prosthetics. Has only walked with her [facility] therapist. Mainly step pivot trnasfers with family. ADLs Comments: Mother helps prn     Hand Dominance   Dominant Hand: Right    Extremity/Trunk Assessment   Upper Extremity Assessment Upper Extremity Assessment: LUE deficits/detail LUE Deficits / Details: L hand weakness, inattention, poor grip strength. LImited LUE mobility against gravity.    Lower Extremity Assessment Lower Extremity Assessment: RLE deficits/detail;LLE deficits/detail RLE Deficits / Details: BKA LLE Deficits / Details: BKA and hx of Lsided weakness from R MCA       Communication   Communication: No difficulties  Cognition Arousal/Alertness: Awake/alert Behavior During Therapy: WFL for tasks assessed/performed Overall Cognitive Status: Within Functional Limits for tasks assessed                                 General Comments: Follows basic commands with increased time.        General Comments      Exercises Other  Exercises Other Exercises: Role of PT in acute setting, d/c recs, BKA positioning/exercises. Safer equipment and alternatives to safely stand and trasnfer at home to reduce falls risk.   Assessment/Plan    PT Assessment Patient needs continued PT services  PT Problem List Decreased strength;Decreased mobility;Decreased safety awareness;Decreased activity tolerance;Decreased balance;Decreased knowledge of use of DME       PT Treatment Interventions DME instruction;Therapeutic exercise;Gait training;Balance training;Neuromuscular re-education;Functional mobility training;Therapeutic activities;Patient/family education    PT Goals (Current goals can be found in the Care Plan section)  Acute Rehab PT Goals Patient Stated Goal: return home PT Goal Formulation: With patient Time For Goal Achievement: 06/16/22 Potential to Achieve Goals: Good    Frequency Min 2X/week     Co-evaluation               AM-PAC PT "6 Clicks" Mobility  Outcome Measure Help needed turning from your back to your side while in a flat bed without using bedrails?: A Little Help needed moving from lying on your back to sitting on the side of a flat bed without using bedrails?: A  Little Help needed moving to and from a bed to a chair (including a wheelchair)?: A Little Help needed standing up from a chair using your arms (e.g., wheelchair or bedside chair)?: A Lot Help needed to walk in hospital room?: A Little Help needed climbing 3-5 steps with a railing? : Total 6 Click Score: 15    End of Session Equipment Utilized During Treatment: Gait belt;Oxygen Activity Tolerance: Patient tolerated treatment well Patient left: in chair;with call bell/phone within reach;with nursing/sitter in room Nurse Communication: Mobility status PT Visit Diagnosis: Unsteadiness on feet (R26.81);Other abnormalities of gait and mobility (R26.89);Muscle weakness (generalized) (M62.81);Difficulty in walking, not elsewhere classified  (R26.2)    Time: 5259-1028 PT Time Calculation (min) (ACUTE ONLY): 43 min   Charges:   PT Evaluation $PT Eval Moderate Complexity: 1 Mod PT Treatments $Therapeutic Activity: 23-37 mins        Rosslyn Pasion M. Fairly IV, PT, DPT Physical Therapist- [facility]  06/02/2022, 1:33 PM

## 2022-06-02 NOTE — Progress Notes (Addendum)
Cindy Osborne  MRN: [id number]  DOB/AGE: 1966/07/03 56 y.o.  Primary Care Physician:Berglund, [name], MD  Admit date: 05/30/2022  Chief Complaint:  Chief Complaint  Patient presents with   Respiratory Distress    S-Pt presented on  05/30/2022 with  Chief Complaint  Patient presents with   Respiratory Distress  .  Patient is a 56 year old African-American female with a past medical history of end-stage renal disease, patient is on hemodialysis on Tuesday Thursday Saturday schedule, hypertension, hyperlipidemia, diabetes mellitus, CVA, history of bilateral BKA, peripheral vascular disease, atrial fibrillation-on anticoagulants, diastolic CHF, anemia who came to the ER with chief complaint of shortness of breath.  Patient resting in bed Alert and oriented  Denies pain or discomfort Remains on room air   Medications   apixaban  5 mg Oral BID   Chlorhexidine Gluconate Cloth  6 each Topical Q0600   insulin aspart  0-5 Units Subcutaneous QHS   insulin aspart  0-9 Units Subcutaneous TID WC   midodrine  10 mg Oral TID WC         [id number] from the symptoms mentioned above,there are no other symptoms referable to all systems reviewed.  Physical Exam: Vital signs in last 24 hours: Temp:  [97.8 F (36.6 C)-98.2 F (36.8 C)] 98.2 F (36.8 C) (10/20 1231) Pulse Rate:  [74-89] 89 (10/20 1231) Resp:  [16-20] 19 (10/20 1231) BP: (110-141)/(72-98) 126/98 (10/20 1231) SpO2:  [95 %-100 %] 97 % (10/20 1231) Weight:  [95.9 kg] 95.9 kg (10/20 0430) Weight change:     Intake/Output from previous day: 10/19 0701 - 10/20 0700 In: 200.6 [IV Piggyback:200.6] Out: 2000  Total I/O In: 360 [P.O.:360] Out: -    Physical Exam:  General- pt is awake,alert, oriented to time place and person  HEENT-sclera is icteric, conjunctive  is pale, head is atraumatic normocephalic  Resp no REsp distress, Decreased breath sounds at bases  CVS- S1S2 irregular in rate and rhythm  GIT-  BS+, soft, Non tender , Non distended  EXT- Bilateral BKA,  No Cyanosis  Access- Left upper extremity AV fistula present  Lab Results:  CBC  Recent Labs    06/01/22 0517 06/02/22 0720  WBC 9.1 8.1  HGB 8.2* 9.3*  HCT 26.6* 29.4*  PLT 174 169     BMET  Recent Labs    06/01/22 0517 06/02/22 0522  NA 137 134*  K 3.4* 3.4*  CL 95* 94*  CO2 26 24  GLUCOSE 85 120*  BUN 32* 25*  CREATININE 6.51* 5.01*  CALCIUM 8.9 8.7*       Most recent Creatinine trend  Lab Results  Component Value Date   CREATININE 5.01 (H) 06/02/2022   CREATININE 6.51 (H) 06/01/2022   CREATININE 5.17 (H) 05/31/2022      MICRO   Recent Results (from the past 240 hour(s))  Resp Panel by RT-PCR (Flu A&B, Covid) Anterior Nasal Swab     Status: None   Collection Time: 05/30/22  8:13 AM   Specimen: Anterior Nasal Swab  Result Value Ref Range Status   SARS Coronavirus 2 by RT PCR NEGATIVE NEGATIVE Final    Comment: (NOTE) SARS-CoV-2 target nucleic acids are NOT DETECTED.  The SARS-CoV-2 RNA is generally detectable in upper respiratory specimens during the acute phase of infection. The lowest concentration of SARS-CoV-2 viral copies this assay can detect is 138 copies/mL. A negative result does not preclude SARS-Cov-2 infection and should not be used as the sole basis for treatment or other  patient management decisions. A negative result may occur with  improper specimen collection/handling, submission of specimen other than nasopharyngeal swab, presence of viral mutation(s) within the areas targeted by this assay, and inadequate number of viral copies(<138 copies/mL). A negative result must be combined with clinical observations, patient history, and epidemiological information. The expected result is Negative.  Fact Sheet for Patients:  EntrepreneurPulse.com.au  Fact Sheet for Healthcare Providers:  IncredibleEmployment.be  This test is no t yet  approved or cleared by the [place] FDA and  has been authorized for detection and/or diagnosis of SARS-CoV-2 by FDA under an Emergency Use Authorization (EUA). This EUA will remain  in effect (meaning this test can be used) for the duration of the COVID-19 declaration under Section 564(b)(1) of the Act, 21 U.S.C.section 360bbb-3(b)(1), unless the authorization is terminated  or revoked sooner.       Influenza A by PCR NEGATIVE NEGATIVE Final   Influenza B by PCR NEGATIVE NEGATIVE Final    Comment: (NOTE) The Xpert Xpress SARS-CoV-2/FLU/RSV plus assay is intended as an aid in the diagnosis of influenza from Nasopharyngeal swab specimens and should not be used as a sole basis for treatment. Nasal washings and aspirates are unacceptable for Xpert Xpress SARS-CoV-2/FLU/RSV testing.  Fact Sheet for Patients: EntrepreneurPulse.com.au  Fact Sheet for Healthcare Providers: IncredibleEmployment.be  This test is not yet approved or cleared by the [place] FDA and has been authorized for detection and/or diagnosis of SARS-CoV-2 by FDA under an Emergency Use Authorization (EUA). This EUA will remain in effect (meaning this test can be used) for the duration of the COVID-19 declaration under Section 564(b)(1) of the Act, 21 U.S.C. section 360bbb-3(b)(1), unless the authorization is terminated or revoked.  Performed at [facility], [address] [place], [place] [address]   Blood Culture (routine x 2)     Status: None (Preliminary result)   Collection Time: 05/30/22  8:13 AM   Specimen: BLOOD  Result Value Ref Range Status   Specimen Description BLOOD BLOOD RIGHT ARM  Final   Special Requests   Final    BOTTLES DRAWN AEROBIC AND ANAEROBIC Blood Culture results may not be optimal due to an excessive volume of blood received in culture bottles   Culture   Final    NO GROWTH 3 DAYS Performed at [facility], [address], [place], [place] [address]    Report Status PENDING  Incomplete  Blood Culture (routine x 2)     Status: None (Preliminary result)   Collection Time: 05/30/22  8:13 AM   Specimen: BLOOD  Result Value Ref Range Status   Specimen Description BLOOD RIGHT ANTECUBITAL  Final   Special Requests   Final    BOTTLES DRAWN AEROBIC AND ANAEROBIC Blood Culture results may not be optimal due to an excessive volume of blood received in culture bottles   Culture   Final    NO GROWTH 3 DAYS Performed at [facility], [address] [place], [place] [address]    Report Status PENDING  Incomplete  Body fluid culture w Gram Stain     Status: None (Preliminary result)   Collection Time: 05/31/22 11:50 AM   Specimen: PATH Cytology Pleural fluid  Result Value Ref Range Status   Specimen Description   Final    PLEURAL Performed at [facility], [address], [place], [place] [address]    Special Requests   Final    NONE Performed at [facility],  [place], [place] [address]    Gram Stain NO WBC SEEN NO ORGANISMS SEEN   Final   Culture   Final    NO GROWTH 2 DAYS Performed at [facility] [address], [place], [place] [address]    Report Status PENDING  Incomplete         Impression:   1)Renal    End-stage renal disease Patient is on hemodialysis Patient is on Tuesday Thursday Saturday schedule Next dialysis scheduled for Saturday.   2)Hypotension Patient was admitted with septic shock. Blood pressure stable for this patient   3)Anemia of chronic disease     Latest Ref Rng & Units 06/02/2022    7:20 AM 06/01/2022    5:17 AM 05/31/2022    5:10 AM  CBC  WBC 4.0 - 10.5 K/uL 8.1  9.1  8.2   Hemoglobin 12.0 - 15.0 g/dL 9.3  8.2  7.0   Hematocrit 36.0 - 46.0 % 29.4  26.6  23.3   Platelets 150 - 400 K/uL 169  174  169        Patient did receive PRBC yesterday  HGb improved with transfusion.  Patient is on Mircera  as an outpatient   4) Secondary hyperparathyroidism -CKD Mineral-Bone Disorder  Lab Results  Component Value Date   CALCIUM 8.7 (L) 06/02/2022   CAION 0.90 (L) 05/27/2022   PHOS 3.3 05/31/2022   Patient has history of secondary parathyroidism Patient intact PTH level was at 584 as an outpatient Patient is on Hectorol and Sensipar  Will continue to monitor bone minerals.   5)Healthcare associated pneumonia/severe sepsis/septic shock Patient is on broad-spectrum antibiotics Patient is being followed by primary team, ICU team   6) Electrolytes      Latest Ref Rng & Units 06/02/2022    5:22 AM 06/01/2022    5:17 AM 05/31/2022    5:10 AM  BMP  Glucose 70 - 99 mg/dL 120  85  91   BUN 6 - 20 mg/dL 25  32  20   Creatinine 0.44 - 1.00 mg/dL 5.01  6.51  5.17   Sodium 135 - 145 mmol/L 134  137  137   Potassium 3.5 - 5.1 mmol/L 3.4  3.4  3.6   Chloride 98 - 111 mmol/L 94  95  100   CO2 22 - 32 mmol/L 24  26  25    Calcium 8.9 - 10.3 mg/dL 8.7  8.9  8.8      Sodium Hyponatremia    Potassium Hypokalemia We will replete  7)Acid base   Co2 at goal  8)Pleural effusion Patient CT chest had shown moderate size complicated right pleural effusion Thoracentesis performed on 05/31/22 with 363ml removed.   9)Liver failure Patient bilirubin improved to 3.8 Patient slowly improving Primary team is following   10)History of CVA Patient has history of CVA with residual left-sided weakness Patient is clinically stable   11)Atrial fibrillation Patient has history of atrial fibrillation Patient is on Eliquis   Plan:  Next treatment scheduled for Saturday   [facility] 06/02/2022, 1:30 PM   I saw and evaluated the patient with Colon Flattery, NP.  I personally formulated the plan of care.  I agree with the findings and plan as documented in the note except as noted .

## 2022-06-02 NOTE — Plan of Care (Signed)
  Problem: Education: Goal: Ability to describe self-care measures that may prevent or decrease complications (Diabetes Survival Skills Education) will improve Outcome: Progressing Goal: Individualized Educational Video(s) Outcome: Progressing   Problem: Coping: Goal: Ability to adjust to condition or change in health will improve Outcome: Progressing   Problem: Fluid Volume: Goal: Ability to maintain a balanced intake and output will improve Outcome: Progressing   Problem: Health Behavior/Discharge Planning: Goal: Ability to identify and utilize available resources and services will improve Outcome: Progressing Goal: Ability to manage health-related needs will improve Outcome: Progressing   Problem: Metabolic: Goal: Ability to maintain appropriate glucose levels will improve Outcome: Progressing   Problem: Nutritional: Goal: Maintenance of adequate nutrition will improve Outcome: Progressing Goal: Progress toward achieving an optimal weight will improve Outcome: Progressing   Problem: Skin Integrity: Goal: Risk for impaired skin integrity will decrease Outcome: Progressing   Problem: Tissue Perfusion: Goal: Adequacy of tissue perfusion will improve Outcome: Progressing   Problem: Activity: Goal: Ability to tolerate increased activity will improve Outcome: Progressing   Problem: Clinical Measurements: Goal: Ability to maintain a body temperature in the normal range will improve Outcome: Progressing   Problem: Respiratory: Goal: Ability to maintain adequate ventilation will improve Outcome: Progressing Goal: Ability to maintain a clear airway will improve Outcome: Progressing   Problem: Education: Goal: Knowledge of General Education information will improve Description: Including pain rating scale, medication(s)/side effects and non-pharmacologic comfort measures Outcome: Progressing   Problem: Health Behavior/Discharge Planning: Goal: Ability to manage  health-related needs will improve Outcome: Progressing   Problem: Clinical Measurements: Goal: Ability to maintain clinical measurements within normal limits will improve Outcome: Progressing Goal: Will remain free from infection Outcome: Progressing Goal: Diagnostic test results will improve Outcome: Progressing Goal: Respiratory complications will improve Outcome: Progressing Goal: Cardiovascular complication will be avoided Outcome: Progressing   Problem: Activity: Goal: Risk for activity intolerance will decrease Outcome: Progressing   Problem: Nutrition: Goal: Adequate nutrition will be maintained Outcome: Progressing   Problem: Coping: Goal: Level of anxiety will decrease Outcome: Progressing   Problem: Elimination: Goal: Will not experience complications related to bowel motility Outcome: Progressing Goal: Will not experience complications related to urinary retention Outcome: Progressing   Problem: Pain Managment: Goal: General experience of comfort will improve Outcome: Progressing   Problem: Safety: Goal: Ability to remain free from injury will improve Outcome: Progressing   Problem: Skin Integrity: Goal: Risk for impaired skin integrity will decrease Outcome: Progressing   

## 2022-06-03 DIAGNOSIS — R7989 Other specified abnormal findings of blood chemistry: Secondary | ICD-10-CM | POA: Diagnosis not present

## 2022-06-03 DIAGNOSIS — J9601 Acute respiratory failure with hypoxia: Secondary | ICD-10-CM | POA: Diagnosis not present

## 2022-06-03 DIAGNOSIS — N186 End stage renal disease: Secondary | ICD-10-CM | POA: Diagnosis not present

## 2022-06-03 DIAGNOSIS — J189 Pneumonia, unspecified organism: Secondary | ICD-10-CM | POA: Diagnosis not present

## 2022-06-03 LAB — COMPREHENSIVE METABOLIC PANEL
ALT: 592 U/L — ABNORMAL HIGH (ref 0–44)
AST: 179 U/L — ABNORMAL HIGH (ref 15–41)
Albumin: 3 g/dL — ABNORMAL LOW (ref 3.5–5.0)
Alkaline Phosphatase: 159 U/L — ABNORMAL HIGH (ref 38–126)
Anion gap: 13 (ref 5–15)
BUN: 32 mg/dL — ABNORMAL HIGH (ref 6–20)
CO2: 26 mmol/L (ref 22–32)
Calcium: 9.2 mg/dL (ref 8.9–10.3)
Chloride: 98 mmol/L (ref 98–111)
Creatinine, Ser: 6.47 mg/dL — ABNORMAL HIGH (ref 0.44–1.00)
GFR, Estimated: 7 mL/min — ABNORMAL LOW (ref >60)
Glucose, Bld: 114 mg/dL — ABNORMAL HIGH (ref 70–99)
Potassium: 3.5 mmol/L (ref 3.5–5.1)
Sodium: 137 mmol/L (ref 135–145)
Total Bilirubin: 2.5 mg/dL — ABNORMAL HIGH (ref 0.3–1.2)
Total Protein: 6.8 g/dL (ref 6.5–8.1)

## 2022-06-03 LAB — CBC
HCT: 29.4 % — ABNORMAL LOW (ref 36.0–46.0)
Hemoglobin: 9.2 g/dL — ABNORMAL LOW (ref 12.0–15.0)
MCH: 29.4 pg (ref 26.0–34.0)
MCHC: 31.3 g/dL (ref 30.0–36.0)
MCV: 93.9 fL (ref 80.0–100.0)
Platelets: 168 10*3/uL (ref 150–400)
RBC: 3.13 MIL/uL — ABNORMAL LOW (ref 3.87–5.11)
RDW: 19.9 % — ABNORMAL HIGH (ref 11.5–15.5)
WBC: 7.3 10*3/uL (ref 4.0–10.5)
nRBC: 1.4 % — ABNORMAL HIGH (ref 0.0–0.2)

## 2022-06-03 LAB — BODY FLUID CULTURE W GRAM STAIN
Culture: NO GROWTH
Gram Stain: NONE SEEN

## 2022-06-03 LAB — GLUCOSE, CAPILLARY
Glucose-Capillary: 117 mg/dL — ABNORMAL HIGH (ref 70–99)
Glucose-Capillary: 100 mg/dL — ABNORMAL HIGH (ref 70–99)

## 2022-06-03 MED ORDER — SODIUM CHLORIDE 0.9 % IV SOLN: INTRAVENOUS | Status: DC | PRN

## 2022-06-03 MED ORDER — MIDODRINE HCL 5 MG PO TABS: 5.0000 mg | ORAL_TABLET | Freq: Three times a day (TID) | ORAL | 3 refills | Status: DC

## 2022-06-03 MED ORDER — OXYCODONE HCL 5 MG PO TABS: 5.0000 mg | ORAL_TABLET | Freq: Four times a day (QID) | ORAL | 0 refills | Status: DC | PRN

## 2022-06-03 MED ORDER — CEFUROXIME AXETIL 500 MG PO TABS: 500.0000 mg | ORAL_TABLET | Freq: Every day | ORAL | 0 refills | Status: AC

## 2022-06-03 MED ORDER — DOXYCYCLINE HYCLATE 100 MG PO TABS: 100.0000 mg | ORAL_TABLET | Freq: Two times a day (BID) | ORAL | 0 refills | Status: AC

## 2022-06-03 MED ORDER — DOXYCYCLINE HYCLATE 100 MG PO TABS
100.0000 mg | ORAL_TABLET | Freq: Two times a day (BID) | ORAL | Status: DC
  Administered 2022-06-03: 100 mg via ORAL
  Filled 2022-06-03 (×2): qty 1

## 2022-06-03 MED ORDER — MIDODRINE HCL 10 MG PO TABS: 10.0000 mg | ORAL_TABLET | Freq: Three times a day (TID) | ORAL | 3 refills | Status: DC

## 2022-06-03 MED ORDER — HYDROXYZINE HCL 10 MG PO TABS: 10.0000 mg | ORAL_TABLET | Freq: Three times a day (TID) | ORAL | 0 refills | Status: DC | PRN

## 2022-06-03 MED ORDER — MIDODRINE HCL 5 MG PO TABS: 5.0000 mg | ORAL_TABLET | Freq: Three times a day (TID) | ORAL | Status: DC

## 2022-06-03 NOTE — Progress Notes (Signed)
Pre hd rn assessment 

## 2022-06-03 NOTE — Progress Notes (Signed)
Pt uf off d/t cramps. Pt alert, no other c/o, vss. Will continue to monitor. Safety maintained.

## 2022-06-03 NOTE — Progress Notes (Signed)
[facility]  Dialysis Note   Subjective:   Seen and examined on hemodialysis treatment.  Still has shortness of breath.  We will increase fluid removal to 3 L today.   HEMODIALYSIS FLOWSHEET:  Blood Flow Rate (mL/min): 400 mL/min Arterial Pressure (mmHg): -120 mmHg Venous Pressure (mmHg): 270 mmHg TMP (mmHg): 9 mmHg Ultrafiltration Rate (mL/min): 1096 mL/min Dialysate Flow Rate (mL/min): 300 ml/min Dialysis Fluid Bolus: Normal Saline    Objective:  Vital signs in last 24 hours:  Temp:  [97.8 F (36.6 C)-98.7 F (37.1 C)] 97.8 F (36.6 C) (10/21 0918) Pulse Rate:  [82-93] 82 (10/21 1030) Resp:  [3-19] 18 (10/21 1030) BP: (122-146)/(66-98) 126/66 (10/21 1030) SpO2:  [95 %-100 %] 100 % (10/21 1100) Weight:  [95.7 kg] 95.7 kg (10/21 0407)  Weight change: -2.2 kg Filed Weights   06/01/22 1149 06/02/22 0430 06/03/22 0407  Weight: 95.5 kg 95.9 kg 95.7 kg    Intake/Output: I/O last 3 completed shifts: In: 1040.6 [P.O.:840; IV Piggyback:200.6] Out: -    Intake/Output this shift:  No intake/output data recorded.  Physical Exam: General: NAD,   Head: Normocephalic, atraumatic. Moist oral mucosal membranes  Eyes: Anicteric, PERRL  Neck: Supple, trachea midline  Lungs:  Clear to auscultation  Heart: Regular rate and rhythm  Abdomen:  Soft, nontender,   Extremities:  peripheral edema.  Neurologic: Nonfocal, moving all four extremities  Skin: No lesions  Access:     Basic Metabolic Panel: Recent Labs  Lab 05/28/22 0118 05/30/22 0813 05/31/22 0510 06/01/22 0517 06/02/22 0522 06/03/22 0545  NA 138 139 137 137 134* 137  K 3.0*  3.1* 4.3 3.6 3.4* 3.4* 3.5  CL 94* 96* 100 95* 94* 98  CO2 27 21* 25 26 24 26   GLUCOSE 123* 225* 91 85 120* 114*  BUN 9 32* 20 32* 25* 32*  CREATININE 4.56* 8.24* 5.17* 6.51* 5.01* 6.47*  CALCIUM 8.4* 9.8 8.8* 8.9 8.7* 9.2  MG 2.0  --  1.9  --   --   --   PHOS 2.7  --  3.3  --   --   --     Liver Function  Tests: Recent Labs  Lab 05/28/22 0118 05/30/22 0813 06/01/22 0517 06/02/22 0522 06/03/22 0545  AST 30 720* 1,252* 358* 179*  ALT 21 407* 1,288* QUANTITY NOT SUFFICIENT, UNABLE TO PERFORM TEST 592*  ALKPHOS 107 175* 201* 171* 159*  BILITOT 2.8* 6.7* 6.1* 3.8* 2.5*  PROT 6.8 7.8 6.9 6.5 6.8  ALBUMIN 3.1* 3.5 3.0* 2.8* 3.0*   Recent Labs  Lab 05/27/22 1649 05/30/22 0813  LIPASE 26 33   No results for input(s): "AMMONIA" in the last 168 hours.  CBC: Recent Labs  Lab 05/30/22 0813 05/31/22 0510 06/01/22 0517 06/02/22 0720 06/03/22 0545  WBC 8.9 8.2 9.1 8.1 7.3  NEUTROABS 7.7  --   --  6.1  --   HGB 8.4* 7.0* 8.2* 9.3* 9.2*  HCT 28.4* 23.3* 26.6* 29.4* 29.4*  MCV 96.9 96.7 94.0 92.7 93.9  PLT 213 169 174 169 168    Cardiac Enzymes: No results for input(s): "CKTOTAL", "CKMB", "CKMBINDEX", "TROPONINI" in the last 168 hours.  BNP: Invalid input(s): "POCBNP"  CBG: Recent Labs  Lab 06/02/22 0808 06/02/22 1232 06/02/22 1623 06/02/22 2138 06/03/22 0804  GLUCAP 118* 155* 119* 127* 117*    Microbiology: Results for orders placed or performed during the hospital encounter of 05/30/22  Resp Panel by RT-PCR (Flu A&B, Covid) Anterior Nasal Swab  Status: None   Collection Time: 05/30/22  8:13 AM   Specimen: Anterior Nasal Swab  Result Value Ref Range Status   SARS Coronavirus 2 by RT PCR NEGATIVE NEGATIVE Final    Comment: (NOTE) SARS-CoV-2 target nucleic acids are NOT DETECTED.  The SARS-CoV-2 RNA is generally detectable in upper respiratory specimens during the acute phase of infection. The lowest concentration of SARS-CoV-2 viral copies this assay can detect is 138 copies/mL. A negative result does not preclude SARS-Cov-2 infection and should not be used as the sole basis for treatment or other patient management decisions. A negative result may occur with  improper specimen collection/handling, submission of specimen other than nasopharyngeal swab, presence  of viral mutation(s) within the areas targeted by this assay, and inadequate number of viral copies(<138 copies/mL). A negative result must be combined with clinical observations, patient history, and epidemiological information. The expected result is Negative.  Fact Sheet for Patients:  EntrepreneurPulse.com.au  Fact Sheet for Healthcare Providers:  IncredibleEmployment.be  This test is no t yet approved or cleared by the [place] FDA and  has been authorized for detection and/or diagnosis of SARS-CoV-2 by FDA under an Emergency Use Authorization (EUA). This EUA will remain  in effect (meaning this test can be used) for the duration of the COVID-19 declaration under Section 564(b)(1) of the Act, 21 U.S.C.section 360bbb-3(b)(1), unless the authorization is terminated  or revoked sooner.       Influenza A by PCR NEGATIVE NEGATIVE Final   Influenza B by PCR NEGATIVE NEGATIVE Final    Comment: (NOTE) The Xpert Xpress SARS-CoV-2/FLU/RSV plus assay is intended as an aid in the diagnosis of influenza from Nasopharyngeal swab specimens and should not be used as a sole basis for treatment. Nasal washings and aspirates are unacceptable for Xpert Xpress SARS-CoV-2/FLU/RSV testing.  Fact Sheet for Patients: EntrepreneurPulse.com.au  Fact Sheet for Healthcare Providers: IncredibleEmployment.be  This test is not yet approved or cleared by the [place] FDA and has been authorized for detection and/or diagnosis of SARS-CoV-2 by FDA under an Emergency Use Authorization (EUA). This EUA will remain in effect (meaning this test can be used) for the duration of the COVID-19 declaration under Section 564(b)(1) of the Act, 21 U.S.C. section 360bbb-3(b)(1), unless the authorization is terminated or revoked.  Performed at [facility], [address] [place], [place] [address]   Blood Culture (routine  x 2)     Status: None (Preliminary result)   Collection Time: 05/30/22  8:13 AM   Specimen: BLOOD  Result Value Ref Range Status   Specimen Description BLOOD BLOOD RIGHT ARM  Final   Special Requests   Final    BOTTLES DRAWN AEROBIC AND ANAEROBIC Blood Culture results may not be optimal due to an excessive volume of blood received in culture bottles   Culture   Final    NO GROWTH 4 DAYS Performed at [facility], [address], [place], [place] [address]    Report Status PENDING  Incomplete  Blood Culture (routine x 2)     Status: None (Preliminary result)   Collection Time: 05/30/22  8:13 AM   Specimen: BLOOD  Result Value Ref Range Status   Specimen Description BLOOD RIGHT ANTECUBITAL  Final   Special Requests   Final    BOTTLES DRAWN AEROBIC AND ANAEROBIC Blood Culture results may not be optimal due to an excessive volume of blood received in culture bottles   Culture   Final    NO GROWTH  4 DAYS Performed at [facility], [address] [place], [place] [address]    Report Status PENDING  Incomplete  Body fluid culture w Gram Stain     Status: None   Collection Time: 05/31/22 11:50 AM   Specimen: PATH Cytology Pleural fluid  Result Value Ref Range Status   Specimen Description   Final    PLEURAL Performed at [facility], [address], [place], [place] [address]    Special Requests   Final    NONE Performed at [facility], [address] [place], [place] [address]    Gram Stain NO WBC SEEN NO ORGANISMS SEEN   Final   Culture   Final    NO GROWTH 3 DAYS Performed at [facility], [place] [address], [place], [place] [address]    Report Status 06/03/2022 FINAL  Final    Coagulation Studies: Recent Labs    06/01/22 1639  LABPROT 29.6*  INR 2.8*    Urinalysis: No results for input(s): "COLORURINE", "LABSPEC", "PHURINE", "GLUCOSEU", "HGBUR", "BILIRUBINUR", "KETONESUR", "PROTEINUR", "UROBILINOGEN", "NITRITE",  "LEUKOCYTESUR" in the last 72 hours.  Invalid input(s): "APPERANCEUR"    Imaging: No results found.   Medications:    sodium chloride 10 mL/hr at 06/03/22 0036   anticoagulant sodium citrate     cefTRIAXone (ROCEPHIN)  IV 2 g (06/02/22 1300)    apixaban  5 mg Oral BID   Chlorhexidine Gluconate Cloth  6 each Topical Q0600   doxycycline  100 mg Oral Q12H   insulin aspart  0-6 Units Subcutaneous TID WC   midodrine  10 mg Oral TID WC   sodium chloride, albuterol, alteplase, anticoagulant sodium citrate, dextromethorphan-guaiFENesin, diphenhydrAMINE, heparin, HYDROmorphone (DILAUDID) injection, hydrOXYzine, ibuprofen, lidocaine (PF), lidocaine-prilocaine, metoprolol tartrate, oxyCODONE, pentafluoroprop-tetrafluoroeth  Assessment/ Plan:  Cindy Osborne is a 56 y.o.  female   Principal Problem:   HCAP (healthcare-associated pneumonia) Active Problems:   Anemia in ESRD (end-stage renal disease) ([place])   ESRD (end stage renal disease) on dialysis ([place])   Essential hypertension   PVD (peripheral vascular disease) ([place])   History of stroke with current residual effects   Severe sepsis with septic shock (HCC)   Type II diabetes mellitus with renal manifestations ([place])   HLD (hyperlipidemia)   Obesity with body mass index (BMI) of 30.0 to 39.9   Abnormal LFTs   Acute respiratory failure with hypoxia (HCC)   Chronic diastolic CHF (congestive heart failure) (HCC)   Thyroid nodule   Pleural effusion on right   PAF (paroxysmal atrial fibrillation) ([place])   Sepsis ([place])     End Stage Renal Disease on hemodialysis:   We will attempt fluid removal 3 L today.  I advised on importance of fluid restriction.  Intake/Output Summary (Last 24 hours) at 06/03/2022 1119 Last data filed at 06/02/2022 1830 Gross per 24 hour  Intake 480 ml  Output --  Net 480 ml    2. Hypertension with chronic kidney disease: BP is better controlled.   BP 126/66 (BP Location: Right Arm)   Pulse  82   Temp 97.8 F (36.6 C) (Oral)   Resp 18   Ht 5\' 9"  (1.753 m)   Wt 95.7 kg   SpO2 100%   BMI 31.16 kg/m   3. Anemia of chronic kidney disease/ kidney injury/chronic disease/acute blood loss:   Lab Results  Component Value Date   HGB 9.2 (L) 06/03/2022   Continue to monitor. 4. Secondary Hyperparathyroidism:  Lab Results  Component Value Date   CALCIUM 9.2 06/03/2022   CAION 0.90 (L) 05/27/2022   PHOS 3.3 05/31/2022    Patient is scheduled for discharge after dialysis today. Advised to follow-up with outpatient dialysis unit.  Fluid restriction advised.   LOS: [place], MD [facility] 10/21/202311:19 AM

## 2022-06-03 NOTE — Discharge Summary (Signed)
Physician Discharge Summary   Patient: Sofya Moustafa MRN: [id number] DOB: [birth year]  Admit date:     05/30/2022  Discharge date: 06/03/22  Discharge Physician: Estill Cotta, MD    PCP: Glean Hess, MD   Recommendations at discharge:   Continue doxycycline 100 mg twice daily for 3 more days, cefuroxime 500 mg daily after dialysis for 3 days Follow LFTs in 1 week  Discharge Diagnoses:    HCAP (healthcare-associated pneumonia)   Severe sepsis with septic shock ([place])   Acute respiratory failure with hypoxia (HCC)   Pleural effusion on right status post thoracentesis Acute transaminitis   Anemia in ESRD (end-stage renal disease) (HCC)   ESRD (end stage renal disease) on dialysis ([place])   Essential hypertension   History of stroke with current residual effects   HLD (hyperlipidemia)   PVD (peripheral vascular disease) (HCC)   Type II diabetes mellitus with renal manifestations (HCC)   Chronic diastolic CHF (congestive heart failure) (HCC)   Thyroid nodule   PAF (paroxysmal atrial fibrillation) (HCC)   Obesity with body mass index (BMI) of 30.0 to 39.9   Hospital Course:  Patient is a 56 year old female with history of ESRD on HD (TTS), HTN, HLD, DM, stroke, s/p of bilateral BKA, PVD, PAF on Eliquis, dCHF, anemia presented with shortness of breath.  Patient reported shortness of breath/started on the day of the admission, progressively worsening.  Associated with dry cough but no fever chills or chest pain.  She was hospitalized from 10/14 to 10/15 due to nausea and vomiting which has resolved.  Patient had dialysis on Saturday. In ED was found to be in respiratory distress, O2 sats 85% on room air, could not speak in full sentences, placed on nonrebreather.  At baseline, does not use O2.   Initially patient was hypotensive with BP 85/74, improved to 200s after 3 L normal saline bolus.   WBCs 8.9, COVID-19 negative. abnormal liver function (ALP 175, AST 720, ALT 407, total  bilirubin 6.7). ABG with pH 7.49, CO2 31, O2 221. Procalcitonin 0.43  lactic acid 8.4 --. 5.9 --> 3.7.  Chest x-ray showed cardiomegaly, vascular congestion and moderate right pleural effusion Pulm critical care and nephrology was consulted   Assessment and Plan: Severe sepsis with septic shock HCAP (healthcare-associated pneumonia)  -Patient met sepsis criteria at the time of admission with hypotension, hypoxia, transaminitis, lactic acidosis, source likely due to HCAP -Sepsis physiology has resolved -O2 sats 100% on room air      Acute respiratory failure with hypoxia ([place]), Pleural effusion on right, HCAP -CT chest abdomen and pelvis showed moderate size complicated right pleural effusion with associated pleural thickening and possibly pleural nodularity.  Size is similar to available prior chest radiographs, such that empyema is unlikely, malignant effusion cannot be excluded -Thoracentesis on 10/18, yielded 350 mL of blood-tinged fluid, cultures negative so far -urine Legionella antigen, urine strep antigen not collected -wean O2 as tolerated -Patient was placed on IV antibiotics, transition to oral doxycycline and cefuroxime for 3 more days to complete full course       Abnormal LFTs/acute transaminitis -Possibly due to shock liver -MRCP showed no evidence of biliary obstruction, no intrahepatic biliary duct dilatation, normal caliber CBD.  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 -AST 1252, ALT 1288, alk phos 201, total bilirubin 6.1.   -GI was consulted -LFTs now rapidly trending down, likely acute liver insult possibly shock liver -Hepatitis A, B and C negative -Upon discharge, alk  phos 159, AST 179, ALT 592, total bilirubin improved to 2.5   ESRD on dialysis, TTS -Nephrology was consulted, underwent HD on 10/17 -Continue HD per schedule     Anemia in ESRD (end-stage renal disease) (HCC) -H&H improving, 9.2  s/p 1 unit packed  RBCs on 10/18     Essential hypertension -Hypotensive on admission, required 3 L IV fluid bolus and then HD.   -Started midodrine 5 mg 3 times daily     History of stroke with current residual effects -Continue eliquis     HLD (hyperlipidemia) -Hold pravastatin       PVD (peripheral vascular disease) ([place]) -Continue eliquis    paroxysmal A-fib -BP was low on admission, placed on midodrine -Heart rate currently controlled, not on metoprolol - continue eliquis     Type II diabetes mellitus with renal manifestations ([place]), -HD A1c 6.0     Chronic diastolic CHF (congestive heart failure) (HCC) -volume management with HD -2D echo 01/2022 showed EF of 55 to 60% with G2 DD     Thyroid nodule -Follow-up with PCP, was scheduled for thyroid ultrasound as outpatient      Obesity with body mass index (BMI) of 30.0 to 39.9 Estimated body mass index is 31.22 kg/m as calculated from the following:   Height as of this encounter: $RemoveBeforeD'5\' 9"'[id number]$  (1.753 m).   Weight as of this encounter: 95.9 kg.     Pain control - [employer] Controlled Substance Reporting System database was reviewed. and patient was instructed, not to drive, operate heavy machinery, perform activities at heights, swimming or participation in water activities or provide baby-sitting services while on Pain, Sleep and Anxiety Medications; until their outpatient Physician has advised to do so again. Also recommended to not to take more than prescribed Pain, Sleep and Anxiety Medications.  Consultants: Gastroenterology, nephrology Procedures performed: Heme dialysis Disposition: Home Diet recommendation:  Discharge Diet Orders (From admission, onward)     Start     Ordered   06/03/22 0000  Diet - low sodium heart healthy        06/03/22 1113           Carb modified diet DISCHARGE MEDICATION: Allergies as of 06/03/2022       Reactions   Augmentin [amoxicillin-pot Clavulanate] Shortness Of Breath   Atorvastatin Other  (See Comments)   Back pain   Pioglitazone Other (See Comments)   Edema   Peanut-containing Drug Products Itching   Rosuvastatin Itching, Other (See Comments)   Back pain   Shellfish-derived Products Itching, Rash   shrimp   Sulfa Antibiotics Rash, Other (See Comments)   Shedding of skin        Medication List     STOP taking these medications    acetaminophen 325 MG tablet Commonly known as: TYLENOL   cinacalcet 30 MG tablet Commonly known as: SENSIPAR   metoprolol 200 MG 24 hr tablet Commonly known as: TOPROL-XL   pravastatin 40 MG tablet Commonly known as: PRAVACHOL       TAKE these medications    apixaban 5 MG Tabs tablet Commonly known as: ELIQUIS Take 1 tablet (5 mg total) by mouth 2 (two) times daily.   cefUROXime 500 MG tablet Commonly known as: CEFTIN Take 1 tablet (500 mg total) by mouth daily for 3 days. After dialysis   doxycycline 100 MG tablet Commonly known as: VIBRA-TABS Take 1 tablet (100 mg total) by mouth 2 (two) times daily for 3 days.   hydrOXYzine 10 MG  tablet Commonly known as: ATARAX Take 1 tablet (10 mg total) by mouth 3 (three) times daily as needed for anxiety or itching.   linagliptin 5 MG Tabs tablet Commonly known as: TRADJENTA Take 1 tablet (5 mg total) by mouth daily.   liver oil-zinc oxide 40 % ointment Commonly known as: DESITIN Apply topically 2 (two) times daily. To buttocks   midodrine 5 MG tablet Commonly known as: PROAMATINE Take 1 tablet (5 mg total) by mouth 3 (three) times daily with meals.   oxyCODONE 5 MG immediate release tablet Commonly known as: Oxy IR/ROXICODONE Take 1 tablet (5 mg total) by mouth every 6 (six) hours as needed for moderate pain or severe pain.        Follow-up Information     Glean Hess, MD. Schedule an appointment as soon as possible for a visit in 2 week(s).   Specialty: Internal Medicine Why: obtain labs, liver function tests in 2 weeks Contact information: [address] [place] [address] [phone]                Discharge Exam: Filed Weights   06/01/22 1149 06/02/22 0430 06/03/22 0407  Weight: 95.5 kg 95.9 kg 95.7 kg   S: No nausea or vomiting, abdominal pain improving no fevers or chills, no chest pain or shortness of breath.  Patient feels a lot better from admission.  Will have hemodialysis today and then plan for DC home  Vitals:   06/03/22 1200 06/03/22 1230 06/03/22 1249 06/03/22 1251  BP: (!) 149/89 (!) 160/88 (!) 140/75 137/79  Pulse: 85 87 85 87  Resp: (!) 30 (!) _0 Temp:   97.8 F (36.6 C)   TempSrc:   Oral   SpO2: 95% 100% 100% 100%  Weight:      Height:        Physical Exam General: Alert and oriented x 3, NAD Cardiovascular: S1 S2 clear, RRR.  Respiratory: CTAB, no wheezing, rales or rhonchi Gastrointestinal: Soft, nontender, nondistended, NBS Ext: bilateral BKA Psych: Normal affect    Condition at discharge: fair  The results of significant diagnostics from this hospitalization (including imaging, microbiology, ancillary and laboratory) are listed below for reference.   Imaging Studies: US THORACENTESIS ASP PLEURAL SPACE W/IMG GUIDE  Result Date: 05/31/2022 INDICATION: Patient with a history of end-stage renal disease and heart failure presents to the ED with shortness of breath. She was found to have a right pleural effusion. Interventional radiology asked to perform a diagnostic and therapeutic thoracentesis. EXAM: ULTRASOUND GUIDED THORACENTESIS MEDICATIONS: 1% lidocaine 10 mL COMPLICATIONS: None immediate. PROCEDURE: An ultrasound guided thoracentesis was thoroughly discussed with the patient and questions answered. The benefits, risks, alternatives and complications were also discussed. The patient understands and wishes to proceed with the procedure. Written consent was obtained. Ultrasound was performed to localize and mark an adequate pocket of fluid in the right chest. The area  was then prepped and draped in the normal sterile fashion. 1% Lidocaine was used for local anesthesia. Under ultrasound guidance a 6 Fr Safe-T-Centesis catheter was introduced. Thoracentesis was performed. The catheter was removed and a dressing applied. FINDINGS: A total of approximately 350 mL of blood-tinged fluid was removed. Samples were sent to the laboratory as requested by the clinical team. IMPRESSION: Successful ultrasound guided right thoracentesis yielding 350 mL of pleural fluid. Read by: Soyla Dryer, NP Electronically Signed   By: Aletta Edouard M.D.   On: 05/31/2022 12:39   DG  Chest Port 1 View  Result Date: 05/31/2022 CLINICAL DATA:  Shortness of breath. Status post thoracentesis. 350 mL drawn of right-sided. EXAM: PORTABLE CHEST 1 VIEW COMPARISON:  CT chest dated May 30, 2022 FINDINGS: The heart is mildly enlarged. Interval decrease in size of the right pleural effusion. Right basilar opacity suggesting atelectasis and/or small residual effusion. No appreciable pneumothorax. Left lung is clear. No acute osseous abnormality IMPRESSION: Interval decrease in size of the right pleural effusion status post thoracentesis. No appreciable pneumothorax. Electronically Signed   By: Keane Police D.O.   On: 05/31/2022 12:04   MR ABDOMEN MRCP WO CONTRAST  Result Date: 05/30/2022 CLINICAL DATA:  Respiratory distress and weakness. Jaundice. Elevated liver enzymes EXAM: MRI ABDOMEN WITHOUT CONTRAST  (INCLUDING MRCP) TECHNIQUE: Multiplanar multisequence MR imaging of the abdomen was performed. Heavily T2-weighted images of the biliary and pancreatic ducts were obtained, and three-dimensional MRCP images were rendered by post processing. COMPARISON:  CT 05/30/2022 FINDINGS: Lower chest: Large septated RIGHT pleural effusion with dense atelectasis of the RIGHT lower lobe. Hepatobiliary: There is no intrahepatic biliary duct dilatation. There is loss of signal intensity on T2 weighted imaging within  liver suggesting iron deposition. This is also represented as loss of signal intensity on inphase imaging (series 6). There is no intrahepatic biliary duct dilatation. The common bile duct is normal caliber. Postcholecystectomy. Pancreas: Normal pancreatic parenchymal intensity. No ductal dilatation or inflammation. Spleen: Low signal intensity within the spleen. Adrenals/urinary tract: Adrenal glands normal. No renal obstruction. Multiple small benign appearing renal cysts. Stomach/Bowel: Stomach is normal. Periampullary duodenal diverticulum measures 1.5 cm. Limited view of the bowel is unremarkable. Vascular/Lymphatic: Abdominal aortic normal caliber. No retroperitoneal periportal lymphadenopathy. Musculoskeletal: No aggressive osseous lesion IMPRESSION: 1. No evidence of biliary obstruction. No intrahepatic biliary duct dilatation. The common bile duct normal caliber. 2. 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 Query blood transfusion history. 3. Pancreas appears normal. Small periampullary duodenum diverticulum. 4. Septated RIGHT pleural effusion with dense RIGHT lower lobe atelectasis. Electronically Signed   By: Suzy Bouchard M.D.   On: 05/30/2022 13:40   MR 3D Recon At Scanner  Result Date: 05/30/2022 CLINICAL DATA:  Respiratory distress and weakness. Jaundice. Elevated liver enzymes EXAM: MRI ABDOMEN WITHOUT CONTRAST  (INCLUDING MRCP) TECHNIQUE: Multiplanar multisequence MR imaging of the abdomen was performed. Heavily T2-weighted images of the biliary and pancreatic ducts were obtained, and three-dimensional MRCP images were rendered by post processing. COMPARISON:  CT 05/30/2022 FINDINGS: Lower chest: Large septated RIGHT pleural effusion with dense atelectasis of the RIGHT lower lobe. Hepatobiliary: There is no intrahepatic biliary duct dilatation. There is loss of signal intensity on T2 weighted imaging within liver suggesting iron deposition. This  is also represented as loss of signal intensity on inphase imaging (series 6). There is no intrahepatic biliary duct dilatation. The common bile duct is normal caliber. Postcholecystectomy. Pancreas: Normal pancreatic parenchymal intensity. No ductal dilatation or inflammation. Spleen: Low signal intensity within the spleen. Adrenals/urinary tract: Adrenal glands normal. No renal obstruction. Multiple small benign appearing renal cysts. Stomach/Bowel: Stomach is normal. Periampullary duodenal diverticulum measures 1.5 cm. Limited view of the bowel is unremarkable. Vascular/Lymphatic: Abdominal aortic normal caliber. No retroperitoneal periportal lymphadenopathy. Musculoskeletal: No aggressive osseous lesion IMPRESSION: 1. No evidence of biliary obstruction. No intrahepatic biliary duct dilatation. The common bile duct normal caliber. 2. 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 Query blood transfusion history. 3. Pancreas appears normal. Small periampullary duodenum  diverticulum. 4. Septated RIGHT pleural effusion with dense RIGHT lower lobe atelectasis. Electronically Signed   By: Suzy Bouchard M.D.   On: 05/30/2022 13:40   CT CHEST ABDOMEN PELVIS W CONTRAST  Addendum Date: 05/30/2022   ADDENDUM REPORT: 05/30/2022 12:22 ADDENDUM: 1.9 cm left thyroid nodule. As previously recommended, unless already performed elsewhere, thyroid ultrasound recommended.(Ref: J Am Coll Radiol. 2015 Feb;12(2): 143-50). Electronically Signed   By: Richardean Sale M.D.   On: 05/30/2022 12:22   Result Date: 05/30/2022 CLINICAL DATA:  Sepsis. Respiratory distress and weakness. Hemodialysis patient. EXAM: CT CHEST, ABDOMEN, AND PELVIS WITH CONTRAST TECHNIQUE: Multidetector CT imaging of the chest, abdomen and pelvis was performed following the standard protocol during bolus administration of intravenous contrast. RADIATION DOSE REDUCTION: This exam was performed according to the  departmental dose-optimization program which includes automated exposure control, adjustment of the mA and/or kV according to patient size and/or use of iterative reconstruction technique. CONTRAST:  159mL OMNIPAQUE IOHEXOL 300 MG/ML  SOLN COMPARISON:  Chest radiograph same date and 05/27/2022. CTA neck 03/21/2022. No other prior relevant CT. FINDINGS: CT CHEST FINDINGS Cardiovascular: No acute vascular findings are seen. There is diffuse atherosclerosis of the aorta, great vessels and coronary arteries. Calcifications of the aortic valve are noted. The heart is mildly enlarged. No significant pericardial fluid. Mediastinum/Nodes: Small right mediastinal and right hilar lymph nodes are noted, measuring up to 1.2 cm in the right paratracheal station on image 16/3. No other enlarged mediastinal, hilar or axillary lymph nodes. Low-density left thyroid nodule measuring 1.9 cm on image 6/3 is unchanged from previous neck CTA. The esophagus appears unremarkable. Lungs/Pleura: Moderate size complicated right pleural effusion appears loculated posterolaterally in the lower right hemithorax. There is associated pleural thickening and possible nodularity in the pleural space. Associated compressive atelectasis of the right lower and middle lobes without obvious endobronchial lesion or central pulmonary mass. There are patchy ground-glass opacities within the aerated portions of both lungs. Musculoskeletal/Chest wall: No chest wall mass or suspicious osseous findings. Mild multilevel thoracic spondylosis. CT ABDOMEN AND PELVIS FINDINGS Hepatobiliary: The liver is normal in density without suspicious focal abnormality. Status post cholecystectomy without evidence of significant biliary dilatation. Pancreas: Unremarkable. No pancreatic ductal dilatation or surrounding inflammatory changes. Spleen: Normal in size without focal abnormality. Adrenals/Urinary Tract: No significant abnormality of the adrenal glands. Bilateral renal  cortical thinning with limited parenchymal enhancement and excretion consistent with chronic renal failure. No evidence of urinary tract calculus or hydronephrosis. The bladder appears unremarkable for its degree of distention. Stomach/Bowel: No enteric contrast administered. The stomach appears unremarkable for its degree of distension. No evidence of bowel wall thickening, distention or surrounding inflammatory change. Incidental proximal duodenal diverticulum. The appendix appears normal. Mild sigmoid diverticulosis. Prominent stool in the rectum. Vascular/Lymphatic: Scattered prominent retroperitoneal lymph nodes, including a left periaortic node measuring 1.3 cm on image 86/3 and a left common iliac node measuring 0.9 cm on image 95/3. Diffuse aortic and branch vessel atherosclerosis without evidence of aneurysm or large vessel occlusion. Reproductive: Intrauterine device in place. There are small calcified uterine fibroids. No suspicious adnexal findings. Other: Small umbilical hernia containing only fat. No ascites or free air. Mild generalized soft tissue edema. Musculoskeletal: No acute or significant osseous findings. Multilevel lumbar spondylosis. Associated Schmorl's nodes, most prominent in the L1 and L4 vertebral bodies. No suspicious osseous findings. IMPRESSION: 1. Moderate size complicated right pleural effusion with associated pleural thickening and possible pleural nodularity. The size of this effusion is similar to available  prior chest radiographs, such that empyema is unlikely. A malignant effusion cannot be excluded, and there is partial collapse of the right middle and lower lobes with mildly prominent right hilar and mediastinal lymph nodes. If not previously performed, recommend further evaluation with thoracentesis. 2. Patchy ground-glass opacities within the aerated portions of both lungs, nonspecific, but potentially inflammatory. 3. No acute findings in the abdomen or pelvis. 4.  Scattered prominent retroperitoneal lymph nodes, nonspecific, but potentially reactive. 5. Chronic renal failure with limited renal parenchymal enhancement and excretion. 6. IUD in place. 7.  Aortic Atherosclerosis (ICD10-I70.0). Electronically Signed: By: Richardean Sale M.D. On: 05/30/2022 11:38   DG Chest Port 1 View  Result Date: 05/30/2022 CLINICAL DATA:  Respiratory distress EXAM: PORTABLE CHEST 1 VIEW COMPARISON:  05/27/2022 FINDINGS: Cardiomegaly. Pulmonary vascular congestion. Moderate right sided pleural effusion with associated right basilar opacity. No pneumothorax. IMPRESSION: 1. Cardiomegaly with pulmonary vascular congestion. 2. Moderate right sided pleural effusion with associated right basilar opacity, similar to the previous study. Electronically Signed   By: Davina Poke D.O.   On: 05/30/2022 08:37   DG CHEST PORT 1 VIEW  Result Date: 05/27/2022 CLINICAL DATA:  Dyspnea. EXAM: PORTABLE CHEST 1 VIEW COMPARISON:  None Available. FINDINGS: The heart is enlarged, unchanged. There is a small right pleural effusion. No pneumothorax. The visualized skeletal structures are unremarkable. IMPRESSION: 1. Small right pleural effusion. 2. Cardiomegaly. Electronically Signed   By: Ronney Asters M.D.   On: 05/27/2022 23:57    Microbiology: Results for orders placed or performed during the hospital encounter of 05/30/22  Resp Panel by RT-PCR (Flu A&B, Covid) Anterior Nasal Swab     Status: None   Collection Time: 05/30/22  8:13 AM   Specimen: Anterior Nasal Swab  Result Value Ref Range Status   SARS Coronavirus 2 by RT PCR NEGATIVE NEGATIVE Final    Comment: (NOTE) SARS-CoV-2 target nucleic acids are NOT DETECTED.  The SARS-CoV-2 RNA is generally detectable in upper respiratory specimens during the acute phase of infection. The lowest concentration of SARS-CoV-2 viral copies this assay can detect is 138 copies/mL. A negative result does not preclude SARS-Cov-2 infection and should not  be used as the sole basis for treatment or other patient management decisions. A negative result may occur with  improper specimen collection/handling, submission of specimen other than nasopharyngeal swab, presence of viral mutation(s) within the areas targeted by this assay, and inadequate number of viral copies(<138 copies/mL). A negative result must be combined with clinical observations, patient history, and epidemiological information. The expected result is Negative.  Fact Sheet for Patients:  EntrepreneurPulse.com.au  Fact Sheet for Healthcare Providers:  IncredibleEmployment.be  This test is no t yet approved or cleared by the [place] FDA and  has been authorized for detection and/or diagnosis of SARS-CoV-2 by FDA under an Emergency Use Authorization (EUA). This EUA will remain  in effect (meaning this test can be used) for the duration of the COVID-19 declaration under Section 564(b)(1) of the Act, 21 U.S.C.section 360bbb-3(b)(1), unless the authorization is terminated  or revoked sooner.       Influenza A by PCR NEGATIVE NEGATIVE Final   Influenza B by PCR NEGATIVE NEGATIVE Final    Comment: (NOTE) The Xpert Xpress SARS-CoV-2/FLU/RSV plus assay is intended as an aid in the diagnosis of influenza from Nasopharyngeal swab specimens and should not be used as a sole basis for treatment. Nasal washings and aspirates are unacceptable for Xpert Xpress SARS-CoV-2/FLU/RSV testing.  Fact Sheet for  Patients: EntrepreneurPulse.com.au  Fact Sheet for Healthcare Providers: IncredibleEmployment.be  This test is not yet approved or cleared by the [place] FDA and has been authorized for detection and/or diagnosis of SARS-CoV-2 by FDA under an Emergency Use Authorization (EUA). This EUA will remain in effect (meaning this test can be used) for the duration of the COVID-19 declaration under Section  564(b)(1) of the Act, 21 U.S.C. section 360bbb-3(b)(1), unless the authorization is terminated or revoked.  Performed at [facility], [address], [place], [place] [address]   Blood Culture (routine x 2)     Status: None (Preliminary result)   Collection Time: 05/30/22  8:13 AM   Specimen: BLOOD  Result Value Ref Range Status   Specimen Description BLOOD BLOOD RIGHT ARM  Final   Special Requests   Final    BOTTLES DRAWN AEROBIC AND ANAEROBIC Blood Culture results may not be optimal due to an excessive volume of blood received in culture bottles   Culture   Final    NO GROWTH 4 DAYS Performed at [facility], [address], [place], [place] [address]    Report Status PENDING  Incomplete  Blood Culture (routine x 2)     Status: None (Preliminary result)   Collection Time: 05/30/22  8:13 AM   Specimen: BLOOD  Result Value Ref Range Status   Specimen Description BLOOD RIGHT ANTECUBITAL  Final   Special Requests   Final    BOTTLES DRAWN AEROBIC AND ANAEROBIC Blood Culture results may not be optimal due to an excessive volume of blood received in culture bottles   Culture   Final    NO GROWTH 4 DAYS Performed at [facility], [address], [place], [place] [address]    Report Status PENDING  Incomplete  Body fluid culture w Gram Stain     Status: None   Collection Time: 05/31/22 11:50 AM   Specimen: PATH Cytology Pleural fluid  Result Value Ref Range Status   Specimen Description   Final    PLEURAL Performed at [facility], [address] [place], [place] [address]    Special Requests   Final    NONE Performed at [facility], [address], [place], [place] [address]    Gram Stain NO WBC SEEN NO ORGANISMS SEEN   Final   Culture   Final    NO GROWTH 3 DAYS Performed at [facility], [place] [address], [place], [place] [address]    Report Status 06/03/2022 FINAL  Final    Labs: CBC: Recent Labs  Lab  05/30/22 0813 05/31/22 0510 06/01/22 0517 06/02/22 0720 06/03/22 0545  WBC 8.9 8.2 9.1 8.1 7.3  NEUTROABS 7.7  --   --  6.1  --   HGB 8.4* 7.0* 8.2* 9.3* 9.2*  HCT 28.4* 23.3* 26.6* 29.4* 29.4*  MCV 96.9 96.7 94.0 92.7 93.9  PLT 213 169 174 169 284   Basic Metabolic Panel: Recent Labs  Lab 05/28/22 0118 05/30/22 0813 05/31/22 0510 06/01/22 0517 06/02/22 0522 06/03/22 0545  NA 138 139 137 137 134* 137  K 3.0*  3.1* 4.3 3.6 3.4* 3.4* 3.5  CL 94* 96* 100 95* 94* 98  CO2 27 21* $Remo'25 26 24 26  'LsCQo$ GLUCOSE 123* 225* 91 85 120* 114*  BUN 9 32* 20 32* 25* 32*  CREATININE 4.56* 8.24* 5.17* 6.51* 5.01* 6.47*  CALCIUM 8.4* 9.8 8.8* 8.9 8.7* 9.2  MG 2.0  --  1.9  --   --   --  PHOS 2.7  --  3.3  --   --   --    Liver Function Tests: Recent Labs  Lab 05/28/22 0118 05/30/22 0813 06/01/22 0517 06/02/22 0522 06/03/22 0545  AST 30 720* 1,252* 358* 179*  ALT 21 407* 1,288* QUANTITY NOT SUFFICIENT, UNABLE TO PERFORM TEST 592*  ALKPHOS 107 175* 201* 171* 159*  BILITOT 2.8* 6.7* 6.1* 3.8* 2.5*  PROT 6.8 7.8 6.9 6.5 6.8  ALBUMIN 3.1* 3.5 3.0* 2.8* 3.0*   CBG: Recent Labs  Lab 06/02/22 0808 06/02/22 1232 06/02/22 1623 06/02/22 2138 06/03/22 0804  GLUCAP 118* 155* 119* 127* 117*    Discharge time spent: greater than 30 minutes.  Signed: Estill Cotta, MD [facility] 06/03/2022

## 2022-06-03 NOTE — Progress Notes (Signed)
Pt prolonged bleeding from arterial site post HD. Surgi seal placed on arterial site. Pt alert, no c/o, vss, bleeding has stopped after applying Surgi seal.

## 2022-06-03 NOTE — TOC Transition Note (Addendum)
Transition of Care [facility]) - CM/SW Discharge Note   Patient Details  Name: Cindy Osborne MRN: [id number] Date of Birth: 12/23/1965  Transition of Care [facility]) CM/SW Contact:  Harriet Masson, RN Phone Number: [phone] 06/03/2022, 1:25 PM   Clinical Narrative:    [facility] RN received a request that pt will need EMS for transport. Spoke with Ivin Booty (sister) concerning [facility] receptive for transporting pt home. States the pt's mother is at the residence upon pt's residence. No other needs at this time. Will call EMS once pt is ready for transportation after her HD.   TOC remains available to assist with any other needs.  3:44 PM Addendum: [facility] called for transportation services.   Final next level of care: Home/Self Care Barriers to Discharge: Barriers Resolved   Patient Goals and CMS Choice        Discharge Placement                  Name of family member notified: [name] (sister) Patient and family notified of of transfer: 06/03/22  Discharge Plan and Services                                     Social Determinants of Health (SDOH) Interventions     Readmission Risk Interventions     No data to display

## 2022-06-03 NOTE — Progress Notes (Signed)
Pt completed 3.25 hour HD treatment. Pt cramped the last 15 minutes of HD. UF was turned off and documented. Pt alert, vss, arterial site held post HD for >20 minutes w/ Surgiseal applied d/t prolonged bleeding. Lanora Manis, MD present and aware. Report to primary RN. Start: 0930 End: 1249 2718ml fluid removed 78L BVP No HD meds ordered

## 2022-06-03 NOTE — Progress Notes (Signed)
Post hd rn assessment 

## 2022-06-03 NOTE — TOC Transition Note (Signed)
Transition of Care [facility]) - CM/SW Discharge Note   Patient Details  Name: Loral Campi MRN: [id number] Date of Birth: October 29, 1965  Transition of Care [facility]) CM/SW Contact:  Harriet Masson, RN Phone [phone] 06/03/2022, 11:59 AM   Clinical Narrative:    Received request t will need transportation after HD. Provided a taxi voucher and contacted Ladona Mow concerning available hours today. Voucher provider to floor for pt for transportation today. No other needs presented.  TOC will remains available for any additional needs.   Final next level of care: Home/Self Care Barriers to Discharge: Barriers Resolved   Patient Goals and CMS Choice        Discharge Placement                    Patient and family notified of of transfer: 06/03/22 (Pt in HD spoke with floor presonnel and provided taxi voucher)  Discharge Plan and Services                                     Social Determinants of Health ([place]) Interventions     Readmission Risk Interventions     No data to display

## 2022-06-04 LAB — CULTURE, BLOOD (ROUTINE X 2)
Culture: NO GROWTH
Culture: NO GROWTH

## 2022-06-04 LAB — HIV ANTIBODY (ROUTINE TESTING W REFLEX): HIV Screen 4th Generation wRfx: NONREACTIVE

## 2022-06-04 LAB — HCV INTERPRETATION

## 2022-06-05 LAB — HEPATITIS PANEL, ACUTE: Hep A IgM: NEGATIVE — AB

## 2022-06-05 LAB — HEPATITIS B SURFACE ANTIBODY,QUALITATIVE

## 2022-06-06 ENCOUNTER — Telehealth: Payer: Self-pay

## 2022-06-06 NOTE — Telephone Encounter (Signed)
Transition Care Management Unsuccessful Follow-up Telephone Call  Date of discharge and from where:  [facility] 06/03/2022  Attempts:  2nd Attempt  Reason for unsuccessful TCM follow-up call:  Left voice message

## 2022-06-06 NOTE — Telephone Encounter (Signed)
Transition Care Management Unsuccessful Follow-up Telephone Call  Date of discharge and from where:  [facility] 06/03/22  Attempts:  1st Attempt  Reason for unsuccessful TCM follow-up call:  Left voice message

## 2022-06-07 ENCOUNTER — Telehealth: Payer: Self-pay | Admitting: Diagnostic Neuroimaging

## 2022-06-07 LAB — CHOLESTEROL, BODY FLUID: Cholesterol, Fluid: 57 mg/dL

## 2022-06-07 NOTE — Telephone Encounter (Signed)
Called patient and informed her Dr Leta Baptist is out of office, Dr Felecia Shelling stated because she had a stroke on aspirin, he thinks staying on the Eliquis is her best option. She stated after each dialysis her fistula site bleeds and bleeds.  The dialysis center is sending her to [facility] tomorrow to get fistula cleaned out, and this will be 4th time. EMS was called once because she wouldn't stop bleeding. She stated she can't keep bleeding like this and having to go to [facility].  I advised will send to Dr Felecia Shelling again and call her back. Patient verbalized understanding, appreciation.

## 2022-06-07 NOTE — Telephone Encounter (Signed)
Pt called stating that she is a dialysis pt and because she is on the apixaban (ELIQUIS) 5 MG TABS tablet she will bleed for 81min at her dialysis appts. Pt would like to know what can be done or given to her in place of the apixaban (ELIQUIS) 5 MG TABS tablet so that she does not risk having another stroke or bleed for so long. Please advise.

## 2022-06-08 MED ORDER — APIXABAN 2.5 MG PO TABS: 2.5000 mg | ORAL_TABLET | Freq: Two times a day (BID) | ORAL | 2 refills | Status: DC

## 2022-06-08 NOTE — Telephone Encounter (Signed)
Per Dr Felecia Shelling, she should lower the dose of Eliquis from 5 mg twice a day to 2.5 mg twice a day. Called patient and advised her of Dr Garth Bigness advice and that new Rx will be sent in, confirmed her pharmacy. She  verbalized understanding, appreciation. Rx escribed as VO.

## 2022-06-09 ENCOUNTER — Encounter: Payer: Medicare Other | Admitting: Physical Medicine & Rehabilitation

## 2022-06-09 NOTE — Progress Notes (Signed)
I did not order that

## 2022-06-16 ENCOUNTER — Telehealth: Payer: Self-pay | Admitting: Internal Medicine

## 2022-06-16 ENCOUNTER — Encounter: Payer: Self-pay | Admitting: Internal Medicine

## 2022-06-16 ENCOUNTER — Other Ambulatory Visit: Payer: Self-pay | Admitting: Internal Medicine

## 2022-06-16 ENCOUNTER — Ambulatory Visit (INDEPENDENT_AMBULATORY_CARE_PROVIDER_SITE_OTHER): Payer: Medicare Other | Admitting: Internal Medicine

## 2022-06-16 VITALS — BP 128/79 | HR 90 | Ht 69.0 in | Wt 219.0 lb

## 2022-06-16 DIAGNOSIS — E118 Type 2 diabetes mellitus with unspecified complications: Secondary | ICD-10-CM

## 2022-06-16 DIAGNOSIS — E1169 Type 2 diabetes mellitus with other specified complication: Secondary | ICD-10-CM

## 2022-06-16 DIAGNOSIS — J189 Pneumonia, unspecified organism: Secondary | ICD-10-CM | POA: Diagnosis not present

## 2022-06-16 DIAGNOSIS — I10 Essential (primary) hypertension: Secondary | ICD-10-CM

## 2022-06-16 DIAGNOSIS — I639 Cerebral infarction, unspecified: Secondary | ICD-10-CM | POA: Diagnosis not present

## 2022-06-16 DIAGNOSIS — K5901 Slow transit constipation: Secondary | ICD-10-CM | POA: Diagnosis not present

## 2022-06-16 DIAGNOSIS — R7989 Other specified abnormal findings of blood chemistry: Secondary | ICD-10-CM

## 2022-06-16 DIAGNOSIS — E785 Hyperlipidemia, unspecified: Secondary | ICD-10-CM

## 2022-06-16 MED ORDER — LINAGLIPTIN 5 MG PO TABS: 5.0000 mg | ORAL_TABLET | Freq: Every day | ORAL | 1 refills | Status: DC

## 2022-06-16 MED ORDER — LINAGLIPTIN 5 MG PO TABS: 5.0000 mg | ORAL_TABLET | Freq: Every day | ORAL | Status: DC

## 2022-06-16 NOTE — Telephone Encounter (Signed)
Pharmacy requesting order for alternative medication- prescribed med not covered by insurance.

## 2022-06-16 NOTE — Telephone Encounter (Signed)
Requested Prescriptions  Pending Prescriptions Disp Refills   Alogliptin Benzoate 25 MG TABS [Pharmacy Med Name: ALOGLIPTIN 25 MG TABLET]  0     Endocrinology:  Diabetes - DPP-4 Inhibitors Failed - 06/16/2022  4:01 PM      Failed - Cr in normal range and within 360 days    Creatinine, Ser  Date Value Ref Range Status  06/03/2022 6.47 (H) 0.44 - 1.00 mg/dL Final         Passed - HBA1C is between 0 and 7.9 and within 180 days    Hemoglobin A1C  Date Value Ref Range Status  08/30/2021 6.0  Final   Hgb A1c MFr Bld  Date Value Ref Range Status  06/01/2022 4.9 4.8 - 5.6 % Final    Comment:    (NOTE) Pre diabetes:          5.7%-6.4%  Diabetes:              >6.4%  Glycemic control for   <7.0% adults with diabetes          Passed - Valid encounter within last 6 months    Recent Outpatient Visits           Today Slow transit constipation   [place] [facility], [name], MD   1 month ago Nodule of left lobe of thyroid gland   [place] [facility], [name], MD   4 months ago Acute right arterial ischemic stroke, MCA (middle cerebral artery) [facility])   [place] [facility] at [facility], [name], MD   9 months ago Acute non-recurrent maxillary sinusitis   [place] [facility] at [facility], [name], MD   1 year ago Mood disorder [facility])   [place] [facility] at [facility], [name], MD       Future Appointments             In 3 weeks Penumalli, Earlean Polka, MD [facility]

## 2022-06-16 NOTE — Telephone Encounter (Signed)
Please review.  KP

## 2022-06-16 NOTE — Progress Notes (Signed)
Date:  06/16/2022   Name:  Cindy Osborne   DOB:  04-Jan-1966   MRN:  [id number]   Chief Complaint: Hospitalization Follow-up  HPI  Recommendations at discharge:    Continue doxycycline 100 mg twice daily for 3 more days, cefuroxime 500 mg daily after dialysis for 3 days Follow LFTs in 1 week   Assessment and Plan: Severe sepsis with septic shock HCAP (healthcare-associated pneumonia)  -Patient met sepsis criteria at the time of admission with hypotension, hypoxia, transaminitis, lactic acidosis, source likely due to HCAP -Sepsis physiology has resolved -O2 sats 100% on room air    Update >>> complete antibiotics.  No shortness of breath or cough or sputum.  No side effect to antibiotics.   O2 sats are good.    Acute respiratory failure with hypoxia (HCC), Pleural effusion on right, HCAP -CT chest abdomen and pelvis showed moderate size complicated right pleural effusion with associated pleural thickening and possibly pleural nodularity.  Size is similar to available prior chest radiographs, such that empyema is unlikely, malignant effusion cannot be excluded -Thoracentesis on 10/18, yielded 350 mL of blood-tinged fluid, cultures negative so far -urine Legionella antigen, urine strep antigen not collected -wean O2 as tolerated -Patient was placed on IV antibiotics, transition to oral doxycycline and cefuroxime for 3 more days to complete full course       Abnormal LFTs/acute transaminitis -Possibly due to shock liver -MRCP showed no evidence of biliary obstruction, no intrahepatic biliary duct dilatation, normal caliber CBD.  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 -AST 1252, ALT 1288, alk phos 201, total bilirubin 6.1.   -GI was consulted -LFTs now rapidly trending down, likely acute liver insult possibly shock liver -Hepatitis A, B and C negative -Upon discharge, alk phos 159, AST 179, ALT 592, total bilirubin improved to 2.5    Update >>> no nausea or vomiting, no diarrhea.  Some constipation this AM.  No jaundice or abdominal pain.  Appetite is improving.    ESRD on dialysis, TTS -Nephrology was consulted, underwent HD on 10/17 -Continue HD per schedule     Anemia in ESRD (end-stage renal disease) ([place]) -H&H improving, 9.2  s/p 1 unit packed RBCs on 10/18     Essential hypertension -Hypotensive on admission, required 3 L IV fluid bolus and then HD.   -Started midodrine 5 mg 3 times daily   Update >>> BP have been holding steady in HD.  No lightheadedness. She is doing more ADLs independently but has help at home.    History of stroke with current residual effects -Continue eliquis   Update >>> back home with help from Mom and sister.  She is gradually working on more independence.  She has an aid that helps for a few hours per day.  She can do her own hair and can toilet independently but sometimes needs help onto the commode.    HLD (hyperlipidemia) -Hold pravastatin   Update >>> pravastatin held due to elevated LFTs.  Will need to recheck and resume statin once returned to normal.    PVD (peripheral vascular disease) ([place]) -Continue eliquis    paroxysmal A-fib -BP was low on admission, placed on midodrine -Heart rate currently controlled, not on metoprolol - continue eliquis     Type II diabetes mellitus with renal manifestations ([place]), -HD A1c 6.0     Chronic diastolic CHF (congestive heart failure) (HCC) -volume management with HD -2D echo 01/2022 showed EF of 55 to 60%  with G2 DD     Thyroid nodule -Follow-up with PCP, was scheduled for thyroid ultrasound as outpatient      Obesity with body mass index (BMI) of 30.0 to 39.9 Estimated body mass index is 31.22 kg/m as calculated from the following:   Height as of this encounter: _0  (1.753 m).   Weight as of this encounter: 95.9 kg. Discharge Diagnoses:     HCAP (healthcare-associated pneumonia)   Severe sepsis with septic shock  ([place])   Acute respiratory failure with hypoxia (HCC)   Pleural effusion on right status post thoracentesis Acute transaminitis   Anemia in ESRD (end-stage renal disease) ([place])   ESRD (end stage renal disease) on dialysis [facility])   Essential hypertension   History of stroke with current residual effects   HLD (hyperlipidemia)   PVD (peripheral vascular disease) (HCC)   Type II diabetes mellitus with renal manifestations (HCC)   Chronic diastolic CHF (congestive heart failure) (HCC)   Thyroid nodule   PAF (paroxysmal atrial fibrillation) ([place])   Obesity with body mass index (BMI) of 30.0 to 39.9 Lab Results  Component Value Date   NA 137 06/03/2022   K 3.5 06/03/2022   CO2 26 06/03/2022   GLUCOSE 114 (H) 06/03/2022   BUN 32 (H) 06/03/2022   CREATININE 6.47 (H) 06/03/2022   CALCIUM 9.2 06/03/2022   GFRNONAA 7 (L) 06/03/2022   Lab Results  Component Value Date   CHOL 107 03/22/2022   HDL 35 (L) 03/22/2022   LDLCALC 55 03/22/2022   TRIG 84 03/22/2022   CHOLHDL 3.1 03/22/2022   Lab Results  Component Value Date   TSH 2.583 05/28/2022   Lab Results  Component Value Date   HGBA1C 4.9 06/01/2022   Lab Results  Component Value Date   WBC 7.3 06/03/2022   HGB 9.2 (L) 06/03/2022   HCT 29.4 (L) 06/03/2022   MCV 93.9 06/03/2022   PLT 168 06/03/2022   Lab Results  Component Value Date   ALT 592 (H) 06/03/2022   AST 179 (H) 06/03/2022   ALKPHOS 159 (H) 06/03/2022   BILITOT 2.5 (H) 06/03/2022   No results found for: "25OHVITD2", "25OHVITD3", "VD25OH"   Review of Systems  Constitutional:  Positive for appetite change (improving). Negative for chills, fatigue and fever.  HENT:  Negative for trouble swallowing.   Respiratory:  Negative for cough, chest tightness, shortness of breath and wheezing.   Cardiovascular:  Negative for chest pain.  Gastrointestinal:  Positive for constipation. Negative for nausea and vomiting.  Neurological:  Negative for dizziness, seizures,  weakness, light-headedness, numbness and headaches.  Psychiatric/Behavioral:  Negative for dysphoric mood and sleep disturbance. The patient is not nervous/anxious.     Patient Active Problem List   Diagnosis Date Noted   Sepsis ([place]) 05/31/2022   Severe sepsis with septic shock ([place]) 05/30/2022   HCAP (healthcare-associated pneumonia) 05/30/2022   Type II diabetes mellitus with renal manifestations ([place]) 05/30/2022   HLD (hyperlipidemia) 05/30/2022   Obesity with body mass index (BMI) of 30.0 to 39.9 05/30/2022   Abnormal LFTs 05/30/2022   Chronic diastolic CHF (congestive heart failure) ([place]) 05/30/2022   Thyroid nodule 05/30/2022   Pleural effusion on right 05/30/2022   PAF (paroxysmal atrial fibrillation) ([place]) 05/30/2022   Acute respiratory failure with hypoxia (HCC)    ESRD on hemodialysis (HCC)    Intractable nausea and vomiting 05/27/2022   Weakness of left upper extremity 05/11/2022   History of stroke with current residual effects 05/08/2022  Mood disorder ([place]) 05/08/2022   Pressure injury of skin 04/05/2022   Obesity 01/24/2022   Acute right arterial ischemic stroke, MCA (middle cerebral artery) ([place]) 01/21/2022   Combined forms of age-related cataract of both eyes 06/30/2021   Diabetic macular edema ([place]) 06/30/2021   Proteinuria 99/37/1696   Metabolic acidosis 78/93/8101   Encounter for insertion of mirena IUD 03/15/2021   Panic disorder 03/03/2021   Acquired absence of left leg below knee ([place]) 10/26/2020   PVD (peripheral vascular disease) ([place]) 07/28/2020   Uncontrolled type 2 diabetes mellitus with diabetic peripheral angiopathy without gangrene 02/26/2020   Acquired absence of right leg below knee ([place]) 07/28/2019   Diabetic retinopathy ([place]) 11/23/2016   Slow transit constipation 09/24/2015   Environmental and seasonal allergies 07/09/2015   Anemia in ESRD (end-stage renal disease) ([place]) 07/09/2015   ESRD (end stage renal disease) on dialysis ([place])  07/09/2015   Type II diabetes mellitus with complication ([place]) 75/05/2584   Herpes simplex infection 07/09/2015   Hyperlipidemia associated with type 2 diabetes mellitus ([place]) 07/09/2015   Heart & renal disease, hypertensive, with heart failure ([place]) 07/09/2015   Avitaminosis D 07/09/2015   Obstructive apnea 12/15/2014   Essential hypertension 09/11/2013    Allergies  Allergen Reactions   Augmentin [Amoxicillin-Pot Clavulanate] Shortness Of Breath   Atorvastatin Other (See Comments)    Back pain   Pioglitazone Other (See Comments)    Edema   Peanut-Containing Drug Products Itching   Rosuvastatin Itching and Other (See Comments)    Back pain   Shellfish-Derived Products Itching and Rash    shrimp   Sulfa Antibiotics Rash and Other (See Comments)    Shedding of skin    Past Surgical History:  Procedure Laterality Date   angioplasty politeal Left 07/2020   avg for dialysis     CHOLECYSTECTOMY     COLONOSCOPY  08/15/2011   cleared for 10 yrs   IR CT HEAD LTD  01/21/2022   IR CT HEAD LTD  03/21/2022   IR PERCUTANEOUS ART THROMBECTOMY/INFUSION INTRACRANIAL INC DIAG ANGIO  01/21/2022   IR PERCUTANEOUS ART THROMBECTOMY/INFUSION INTRACRANIAL INC DIAG ANGIO  03/21/2022   IR US GUIDE VASC ACCESS RIGHT  01/21/2022   IR US GUIDE VASC ACCESS RIGHT  03/21/2022   LEG AMPUTATION BELOW KNEE Right 04/2019   LEG AMPUTATION BELOW KNEE Left 09/08/2020   RADIOLOGY WITH ANESTHESIA N/A 01/21/2022   Procedure: IR WITH ANESTHESIA;  Surgeon: Radiologist, Medication, MD;  Location: [place];  Service: Radiology;  Laterality: N/A;   RADIOLOGY WITH ANESTHESIA N/A 03/21/2022   Procedure: IR WITH ANESTHESIA;  Surgeon: Radiologist, Medication, MD;  Location: [place];  Service: Radiology;  Laterality: N/A;   TOE AMPUTATION Right 10/15/2018    Social History   Tobacco Use   Smoking status: Never   Smokeless tobacco: Never  Vaping Use   Vaping Use: Never used  Substance Use Topics   Alcohol use: Yes   Drug use:  Never     Medication list has been reviewed and updated.  Current Meds  Medication Sig   apixaban (ELIQUIS) 2.5 MG TABS tablet Take 1 tablet (2.5 mg total) by mouth 2 (two) times daily.   hydrOXYzine (ATARAX) 10 MG tablet Take 1 tablet (10 mg total) by mouth 3 (three) times daily as needed for anxiety or itching.   liver oil-zinc oxide (DESITIN) 40 % ointment Apply topically 2 (two) times daily. To buttocks   midodrine (PROAMATINE) 5 MG tablet Take 1 tablet (5 mg  total) by mouth 3 (three) times daily with meals.   oxyCODONE (OXY IR/ROXICODONE) 5 MG immediate release tablet Take 1 tablet (5 mg total) by mouth every 6 (six) hours as needed for moderate pain or severe pain.   [DISCONTINUED] linagliptin (TRADJENTA) 5 MG TABS tablet Take 1 tablet (5 mg total) by mouth daily.       05/08/2022    9:50 AM 02/06/2022    2:07 PM 09/14/2021    2:43 PM 06/09/2021   11:03 AM  GAD 7 : Generalized Anxiety Score  Nervous, Anxious, on Edge 0 _0 Control/stop worrying 0 0 0 0  Worry too much - different things 0 0 1 1  Trouble relaxing 0 0 3 3  Restless 0 0 3 3  Easily annoyed or irritable 0 0 3 3  Afraid - awful might happen 0 0 0 0  Total GAD 7 Score 0 _1 Anxiety Difficulty Not difficult at all Not difficult at all Not difficult at all Not difficult at all       05/08/2022    9:50 AM 04/21/2022    1:03 PM 02/06/2022    2:06 PM  Depression screen PHQ 2/9  Decreased Interest _2 Down, Depressed, Hopeless 0 0 0  PHQ - 2 Score _3 Altered sleeping 1 0 0  Tired, decreased energy 0 2 0  Change in appetite 0 0 0  Feeling bad or failure about yourself  0 2 0  Trouble concentrating 0 2 0  Moving slowly or fidgety/restless 0 3 0  Suicidal thoughts 0 0 0  PHQ-9 Score _4 Difficult doing work/chores Not difficult at all Somewhat difficult Not difficult at all    BP Readings from Last 3 Encounters:  06/16/22 128/79  06/03/22 133/75  05/28/22 131/75    Physical Exam Neck:      Vascular: No carotid bruit.  Cardiovascular:     Rate and Rhythm: Normal rate and regular rhythm.     Pulses: Normal pulses.  Pulmonary:     Effort: Pulmonary effort is normal.     Breath sounds: Normal breath sounds. No wheezing or rhonchi.  Musculoskeletal:     Cervical back: Normal range of motion.  Lymphadenopathy:     Cervical: No cervical adenopathy.  Skin:    Capillary Refill: Capillary refill takes less than 2 seconds.  Neurological:     Mental Status: She is alert.     Motor: Weakness present.     Gait: Abnormal gait: bilateral BKA.     Wt Readings from Last 3 Encounters:  06/16/22 219 lb (99.3 kg)  06/03/22 210 lb 15.7 oz (95.7 kg)  05/27/22 213 lb (96.6 kg)    BP 128/79 (BP Location: Right Arm, Patient Position: Sitting, Cuff Size: Normal)   Pulse 90   Ht _5  (1.753 m)   Wt 219 lb (99.3 kg)   SpO2 95%   BMI 32.34 kg/m   Assessment and Plan: 1. Community acquired pneumonia of right lower lobe of lung She has completed course of Doxycycline and cephalosporin. Pleural fluid culture was negative.  2. Elevated LFTs Testing suggested secondary iron deposition in the liver. Will repeat labs; may need GI evaluation if persistently elevated. - Hepatic function panel  3. Slow transit constipation Recommend stool softener daily and adequate daily hydration with limits per nephrology.  4. Type II diabetes mellitus with complication (HCC) Stable on oral agent. - linagliptin (  TRADJENTA) 5 MG TABS tablet; Take 1 tablet (5 mg total) by mouth daily.  Dispense: 90 tablet; Refill: 1  5. Essential hypertension No longer on metoprolol due to hypotension On Midodrine with stable BP in HD Will monitor for increase and need to adjust medications   6. Hyperlipidemia associated with type 2 diabetes mellitus ([place]) Holding pravachol until LFTs normalize.   Partially dictated using Editor, commissioning. Any errors are unintentional.  Halina Maidens, MD [facility]  06/16/2022

## 2022-06-17 LAB — HEPATIC FUNCTION PANEL
ALT: 25 IU/L (ref 0–32)
AST: 19 IU/L (ref 0–40)
Albumin: 3.8 g/dL (ref 3.8–4.9)
Alkaline Phosphatase: 149 IU/L — ABNORMAL HIGH (ref 44–121)
Bilirubin Total: 1.3 mg/dL — ABNORMAL HIGH (ref 0.0–1.2)
Bilirubin, Direct: 0.54 mg/dL — ABNORMAL HIGH (ref 0.00–0.40)
Total Protein: 7 g/dL (ref 6.0–8.5)

## 2022-06-20 ENCOUNTER — Other Ambulatory Visit: Payer: Self-pay | Admitting: Internal Medicine

## 2022-06-20 NOTE — Progress Notes (Signed)
Called pt left VM to call back.  PEC may give results if patient returns call - CRM created.  KP

## 2022-06-21 NOTE — Progress Notes (Signed)
Pt requesting "Breathing treatments like I had in hospital, felt a lot better with those." Pt states referring to nebulizer, would like one for home use. States some chest tightness remains.  KP

## 2022-06-21 NOTE — Telephone Encounter (Signed)
Called pt left VM to call back.  Read pt message from Dr. [name].  "Pt does not have a diagnosis for which nebulizer is indicated.  If she is still having breathing issues, she needs to see Pulmonary to follow up on the pneumonia and pleural fluid.  KP

## 2022-06-21 NOTE — Progress Notes (Signed)
Called pt left VM to call back. Please read message from Dr. [name] to pt.  KP

## 2022-06-21 NOTE — Telephone Encounter (Signed)
Pt called back, advised of message from Dr. [name], didn't have no further comments or questions.

## 2022-06-21 NOTE — Telephone Encounter (Signed)
Copied from [place] [phone]. Topic: General - Inquiry >> Jun 21, 2022  2:38 PM Marcellus Scott wrote: Reason for CRM: Pt is calling back to follow up on her request for breathing treatments. Please see notes from [facility], 06/21/2022, 9:32 AM under labs. requesting "Breathing treatments like I had in hospital, felt a lot better with those." Pt states referring to nebulizer, would like one for home use. States some chest tightness remains.  Please advise.

## 2022-06-30 ENCOUNTER — Other Ambulatory Visit: Payer: Self-pay

## 2022-07-03 ENCOUNTER — Telehealth: Payer: Self-pay | Admitting: Internal Medicine

## 2022-07-03 ENCOUNTER — Other Ambulatory Visit: Payer: Self-pay

## 2022-07-03 NOTE — Telephone Encounter (Signed)
Home Health Verbal Orders - Caller/Agency: [facility] Number: [phone] Requesting OT/PT/Skilled Nursing/Social Work/Speech Therapy: Speech Therapy  Frequency:   1w4 - working on cognition and speech precision.

## 2022-07-03 NOTE — Patient Outreach (Signed)
Telephone outreach to patient to obtain mRS was successfully completed. MRS= 4   Patient is concerned about having strokes within a month apart. Advised to speak with pcp and neurologist.  Philmore Pali [facility] Care Management Assistant [phone]

## 2022-07-03 NOTE — Telephone Encounter (Signed)
Verbal orders given.  CMcAdoo

## 2022-07-10 ENCOUNTER — Encounter: Payer: Self-pay | Admitting: Diagnostic Neuroimaging

## 2022-07-10 ENCOUNTER — Ambulatory Visit (INDEPENDENT_AMBULATORY_CARE_PROVIDER_SITE_OTHER): Payer: Medicare Other | Admitting: Diagnostic Neuroimaging

## 2022-07-10 VITALS — BP 126/76 | HR 87 | Ht 69.0 in | Wt 192.0 lb

## 2022-07-10 DIAGNOSIS — I639 Cerebral infarction, unspecified: Secondary | ICD-10-CM | POA: Diagnosis not present

## 2022-07-10 DIAGNOSIS — I63411 Cerebral infarction due to embolism of right middle cerebral artery: Secondary | ICD-10-CM | POA: Diagnosis not present

## 2022-07-10 DIAGNOSIS — I480 Paroxysmal atrial fibrillation: Secondary | ICD-10-CM | POA: Diagnosis not present

## 2022-07-10 NOTE — Progress Notes (Unsigned)
[facility]  PATIENT: Cindy Osborne DOB: Oct 08, 1965  REFERRING CLINICIAN: Bary Leriche, PA-C HISTORY FROM: patient  REASON FOR VISIT: new consult    HISTORICAL  CHIEF COMPLAINT:  Chief Complaint  Patient presents with   Follow-up    Pt is following up from hospital adm 8/7-8/8. She had another stroke. She had a nurse eval and therapy completed.     HISTORY OF PRESENT ILLNESS:   UPDATE (07/10/22, VRP): Since last visit, had another right MCA stroke (s) due to new dx of pAfib and RVR. Now on eliquis. Dose was reduced to 2.5mg  twice a day due to excess bleeding after dialysis sessions.  PRIOR HPI (03/20/22): 56 year old female here for evaluation of stroke.  Patient was at dialysis center and had some difficulty walking was taken to the hospital for evaluation of stroke.  She was found to have right MCA infarct with M1 occlusion, received tenecteplase and mechanical thrombectomy.  Stroke work-up was completed.  Patient has been doing well.  Tolerating medications.    REVIEW OF SYSTEMS: Full 14 system review of systems performed and negative with exception of: as per HPI.  ALLERGIES: Allergies  Allergen Reactions   Augmentin [Amoxicillin-Pot Clavulanate] Shortness Of Breath   Atorvastatin Other (See Comments)    Back pain   Pioglitazone Other (See Comments)    Edema   Peanut-Containing Drug Products Itching   Rosuvastatin Itching and Other (See Comments)    Back pain   Shellfish-Derived Products Itching and Rash    shrimp   Sulfa Antibiotics Rash and Other (See Comments)    Shedding of skin    HOME MEDICATIONS: Outpatient Medications Prior to Visit  Medication Sig Dispense Refill   apixaban (ELIQUIS) 2.5 MG TABS tablet Take 1 tablet (2.5 mg total) by mouth 2 (two) times daily. 60 tablet 2   hydrOXYzine (ATARAX) 10 MG tablet Take 1 tablet (10 mg total) by mouth 3 (three) times daily as needed for anxiety or itching. 60 tablet 0   liver oil-zinc  oxide (DESITIN) 40 % ointment Apply topically 2 (two) times daily. To buttocks 56.7 g 0   midodrine (PROAMATINE) 5 MG tablet Take 1 tablet (5 mg total) by mouth 3 (three) times daily with meals. 90 tablet 3   oxyCODONE (OXY IR/ROXICODONE) 5 MG immediate release tablet Take 1 tablet (5 mg total) by mouth every 6 (six) hours as needed for moderate pain or severe pain. 20 tablet 0   linagliptin (TRADJENTA) 5 MG TABS tablet Take 1 tablet (5 mg total) by mouth daily. 90 tablet 1   No facility-administered medications prior to visit.    PAST MEDICAL HISTORY: Past Medical History:  Diagnosis Date   Acute ischemic right MCA stroke ([place]) 03/25/2022   Amputated toe ([place]) 01/31/2012   Right second toe distal phalanx Right great toe   Diabetes mellitus without complication ([place])    History of diabetic ulcer of foot 04/01/2014   Hyperlipidemia    Hypertension    Stroke (cerebrum) ([place]) 03/21/2022   Stroke ([place]) 01/21/2022    PAST SURGICAL HISTORY: Past Surgical History:  Procedure Laterality Date   angioplasty politeal Left 07/2020   avg for dialysis     CHOLECYSTECTOMY     COLONOSCOPY  08/15/2011   cleared for 10 yrs   IR CT HEAD LTD  01/21/2022   IR CT HEAD LTD  03/21/2022   IR PERCUTANEOUS ART THROMBECTOMY/INFUSION INTRACRANIAL INC DIAG ANGIO  01/21/2022   IR PERCUTANEOUS ART THROMBECTOMY/INFUSION INTRACRANIAL INC  DIAG ANGIO  03/21/2022   IR US GUIDE VASC ACCESS RIGHT  01/21/2022   IR US GUIDE VASC ACCESS RIGHT  03/21/2022   LEG AMPUTATION BELOW KNEE Right 04/2019   LEG AMPUTATION BELOW KNEE Left 09/08/2020   RADIOLOGY WITH ANESTHESIA N/A 01/21/2022   Procedure: IR WITH ANESTHESIA;  Surgeon: Radiologist, Medication, MD;  Location: [place];  Service: Radiology;  Laterality: N/A;   RADIOLOGY WITH ANESTHESIA N/A 03/21/2022   Procedure: IR WITH ANESTHESIA;  Surgeon: Radiologist, Medication, MD;  Location: [place];  Service: Radiology;  Laterality: N/A;   TOE AMPUTATION Right 10/15/2018    FAMILY  HISTORY: Family History  Problem Relation Age of Onset   Diabetes Mother    Diabetes Sister     SOCIAL HISTORY: Social History   Socioeconomic History   Marital status: Divorced    Spouse name: Not on file   Number of children: Not on file   Years of education: Not on file   Highest education level: Not on file  Occupational History   Not on file  Tobacco Use   Smoking status: Never   Smokeless tobacco: Never  Vaping Use   Vaping Use: Never used  Substance and Sexual Activity   Alcohol use: Yes   Drug use: Never   Sexual activity: Yes    Partners: Male  Other Topics Concern   Not on file  Social History Narrative   Not on file   Social Determinants of Health   Financial Resource Strain: Low Risk  (02/06/2022)   Overall Financial Resource Strain (CARDIA)    Difficulty of Paying Living Expenses: Not hard at all  Food Insecurity: No Food Insecurity (05/31/2022)   Hunger Vital Sign    Worried About Running Out of Food in the Last Year: Never true    [place] in the Last Year: Never true  Transportation Needs: No Transportation Needs (05/31/2022)   PRAPARE - Hydrologist (Medical): No    Lack of Transportation (Non-Medical): No  Physical Activity: Not on file  Stress: Not on file  Social Connections: Not on file  Intimate Partner Violence: Not At Risk (05/31/2022)   Humiliation, Afraid, Rape, and Kick questionnaire    Fear of Current or Ex-Partner: No    Emotionally Abused: No    Physically Abused: No    Sexually Abused: No     PHYSICAL EXAM  GENERAL EXAM/CONSTITUTIONAL: Vitals:  Vitals:   07/10/22 0909  BP: 126/76  Pulse: 87  Height: 5\' 9"  (1.753 m)   Body mass index is 32.34 kg/m. Wt Readings from Last 3 Encounters:  06/16/22 219 lb (99.3 kg)  06/03/22 210 lb 15.7 oz (95.7 kg)  05/27/22 213 lb (96.6 kg)   Patient is in no distress; well developed, nourished and groomed; neck is  supple  CARDIOVASCULAR: Examination of carotid arteries is normal; no carotid bruits Regular rate and rhythm, no murmurs Examination of peripheral vascular system by observation and palpation is normal  EYES: Ophthalmoscopic exam of optic discs and posterior segments is normal; no papilledema or hemorrhages No results found.  MUSCULOSKELETAL: Gait, strength, tone, movements noted in Neurologic exam below  NEUROLOGIC: MENTAL STATUS:      No data to display         awake, alert, oriented to person, place and time recent and remote memory intact normal attention and concentration language fluent, comprehension intact, naming intact fund of knowledge appropriate  CRANIAL NERVE:  2nd - no  papilledema on fundoscopic exam 2nd, 3rd, 4th, 6th - pupils equal and reactive to light, visual fields full to confrontation, extraocular muscles intact, no nystagmus 5th - facial sensation symmetric 7th - facial strength symmetric; DECR LEFT NL FOLD; DECR LEFT LOWER SMILE STRENGTH 8th - hearing intact 9th - palate elevates symmetrically, uvula midline 11th - shoulder shrug --> LEFT SHOULDER SHRUG DECREASED 12th - tongue protrusion midline  MOTOR:  normal bulk and tone, full strength in the RUE; LUE BICEPS 4, TRICEPS 3, GRIP AND FINGER ABDUCTION 4; LEFT HIP FLEXION 3-4; BILATERAL BELOW KNEE AMPUTATIONS  SENSORY:  normal and symmetric to light touch, temperature, vibration  COORDINATION:  finger-nose-finger, fine finger movements normal  REFLEXES:  deep tendon reflexes TRACE IN BUE  GAIT/STATION:  narrow based gait; USING CANE     DIAGNOSTIC DATA (LABS, IMAGING, TESTING) - I reviewed patient records, labs, notes, testing and imaging myself where available.  Lab Results  Component Value Date   WBC 7.3 06/03/2022   HGB 9.2 (L) 06/03/2022   HCT 29.4 (L) 06/03/2022   MCV 93.9 06/03/2022   PLT 168 06/03/2022      Component Value Date/Time   NA 137 06/03/2022 0545   NA 136  (A) 02/02/2021 0000   K 3.5 06/03/2022 0545   CL 98 06/03/2022 0545   CO2 26 06/03/2022 0545   GLUCOSE 114 (H) 06/03/2022 0545   BUN 32 (H) 06/03/2022 0545   BUN 112 (A) 02/02/2021 0000   CREATININE 6.47 (H) 06/03/2022 0545   CALCIUM 9.2 06/03/2022 0545   PROT 7.0 06/16/2022 1558   ALBUMIN 3.8 06/16/2022 1558   AST 19 06/16/2022 1558   ALT 25 06/16/2022 1558   ALKPHOS 149 (H) 06/16/2022 1558   BILITOT 1.3 (H) 06/16/2022 1558   GFRNONAA 7 (L) 06/03/2022 0545   GFRAA 4 02/02/2021 0000   Lab Results  Component Value Date   CHOL 107 03/22/2022   HDL 35 (L) 03/22/2022   LDLCALC 55 03/22/2022   TRIG 84 03/22/2022   CHOLHDL 3.1 03/22/2022   Lab Results  Component Value Date   HGBA1C 4.9 06/01/2022   No results found for: "VITAMINB12" Lab Results  Component Value Date   TSH 2.583 05/28/2022    03/22/22 MRI brain [I reviewed images myself and agree with interpretation. -VRP]  1. Moderate-sized patchy acute ischemic right MCA distribution infarct as above. Associated petechial hemorrhage without frank hemorrhagic transformation or significant mass effect. 2. Few scattered underlying chronic ischemic infarcts as above. No other acute intracranial abnormality.   ASSESSMENT AND PLAN  56 y.o. year old female here with:   Dx:  1. Cerebrovascular accident (CVA) due to embolism of right middle cerebral artery ([place])   2. PAF (paroxysmal atrial fibrillation) (HCC)      PLAN:  Stroke: Moderate-sized patchy acute ischemic right MCA infarcts likely due to new diagnosed atrial fibrillation.  CT showed right frontal hypodensity.   CTA head and neck right M2 occlusion.   CTP 0/21cc.   Status post IR with TICI3 reperfusion.   MRI showed moderate scattered right MCA infarcts.   EF 55 to 60% in 01/2022 LE venous Doppler negative for DVT.   A1c 6.0 LDL 55.   DVT prophylaxis - Eliquis Aspirin PTA, now on Eliquis for stroke prevention. Ok to have dental procedure (has been 3  months post stroke) There may be a small risk of stroke by stopping antiplatelet or anticoagulation therapy in the peri-procedural period. If patient agrees to these risks, then  patient may come off (eliquis for 3 days) in preparation for procedure, and restarted as soon as possible post-procedure.   A-fib RVR New diagnosis (03/23/22 during dialysis) Continue eliquis (dose reduced to 2.5mg  due to excessive bleeding post dialysis)  Follow up with PCP or cardiology   Hyperlipidemia LDL 55, well controlled and at goal less than 70   ESRD on HD: - per nephrology   DM2 resolved    Return for return to PCP.    Penni Bombard, MD 44/58/4835, 0:75 AM Certified in Neurology, Neurophysiology and Neuroimaging  [facility] [address] [place], [place] [address] [phone]

## 2022-07-11 ENCOUNTER — Encounter: Payer: Self-pay | Admitting: Diagnostic Neuroimaging

## 2022-07-12 NOTE — Telephone Encounter (Signed)
Pt called need a letter stating she is not able to use her left side. Need it mailed to home address.

## 2022-07-12 NOTE — Telephone Encounter (Signed)
Contacted patient back, left voicemail per DPR informing her sports medicine gave her a letter stating that due to her stroke she is unable to use her left back in September.  If she needed the letter reprinted she can contact their office, Glean Hess, MD,  to get the letter provided.  Number provided to call back with questions.

## 2022-07-17 ENCOUNTER — Telehealth: Payer: Self-pay | Admitting: Internal Medicine

## 2022-07-17 NOTE — Telephone Encounter (Signed)
Copied from [place] [phone]. Topic: General - Other >> Jul 17, 2022 11:00 AM Tiffany B wrote:  Home Health Verbal Orders - Caller/Agency: Araceli Bouche PT from [facility] Number: [phone] Requesting PT Frequency:   2x 5, 1x 4

## 2022-07-17 NOTE — Telephone Encounter (Signed)
Verbal orders given.  - Cindy Osborne 

## 2022-07-21 ENCOUNTER — Encounter: Payer: Medicare Other | Attending: Registered Nurse | Admitting: Physical Medicine & Rehabilitation

## 2022-07-21 DIAGNOSIS — E1169 Type 2 diabetes mellitus with other specified complication: Secondary | ICD-10-CM | POA: Insufficient documentation

## 2022-07-21 DIAGNOSIS — E785 Hyperlipidemia, unspecified: Secondary | ICD-10-CM | POA: Insufficient documentation

## 2022-07-21 DIAGNOSIS — I63511 Cerebral infarction due to unspecified occlusion or stenosis of right middle cerebral artery: Secondary | ICD-10-CM | POA: Insufficient documentation

## 2022-07-21 DIAGNOSIS — E118 Type 2 diabetes mellitus with unspecified complications: Secondary | ICD-10-CM | POA: Insufficient documentation

## 2022-07-21 DIAGNOSIS — N186 End stage renal disease: Secondary | ICD-10-CM | POA: Insufficient documentation

## 2022-07-21 DIAGNOSIS — Z992 Dependence on renal dialysis: Secondary | ICD-10-CM | POA: Insufficient documentation

## 2022-07-28 ENCOUNTER — Ambulatory Visit: Payer: Self-pay | Admitting: *Deleted

## 2022-07-28 NOTE — Telephone Encounter (Signed)
Attempted to reach, left message to CB. Routing to provider per protocol.

## 2022-07-28 NOTE — Telephone Encounter (Signed)
  Caller states she is the [place] copy of [facility] and need some diagnosis verified  Please assist further

## 2022-08-09 ENCOUNTER — Telehealth: Payer: Self-pay | Admitting: Diagnostic Neuroimaging

## 2022-08-09 ENCOUNTER — Telehealth: Payer: Self-pay | Admitting: Internal Medicine

## 2022-08-09 NOTE — Telephone Encounter (Signed)
Pt called stating that the letter she received for her insurance did not state that the stroke she had 08/07 was the one that made her loose strength on her left side especially on the L hand. Pt states she is needing a new letter for her insurance and would like a call to be informed when the letter will be mailed out to her. Please advise.

## 2022-08-09 NOTE — Telephone Encounter (Signed)
Patient called in requesting that Dr [name] put in order for her a new prosthetic, to go to [facility], because she has loss weight and the one she has is too big. Cindy Osborne states she can't get assistance from the  prostetic Dr, Dr Posey Pronto.

## 2022-08-10 ENCOUNTER — Telehealth: Payer: Self-pay | Admitting: Internal Medicine

## 2022-08-10 ENCOUNTER — Encounter: Payer: Self-pay | Admitting: Neurology

## 2022-08-10 NOTE — Telephone Encounter (Signed)
Called and spoke with patient and informed. She said she spoke with [facility] and they evaluated her current prosthetic and she does need a new size. [facility] is supposed to send [place] a form or letter stating what patient is needing. Awaiting fax.  Cindy Osborne

## 2022-08-10 NOTE — Telephone Encounter (Signed)
Letter completed and placed in mail for the patient.

## 2022-08-10 NOTE — Telephone Encounter (Signed)
Called the patient back because I only see where the PCP had completed a letter. Pt states she is needing a letter specifically stating that she saw Dr Leta Baptist on 8/7 for stroke and suffered left side weakness. Advised I will write the letter and place in mail for her.

## 2022-08-10 NOTE — Telephone Encounter (Signed)
Copied from [place] [phone]. Topic: General - Other >> Aug 10, 2022 11:34 AM Erskine Squibb wrote: Reason for CRM: Cindy Osborne with [facility] called in reporting a missed PT visit from yesterday. The patient wasn't available and rescheduled until tomorrow. She is supposed to be seen twice a week but couldn't yesterday and so this is an official missed visit for her particular plan of care. Please assist patient further

## 2022-08-11 ENCOUNTER — Other Ambulatory Visit (INDEPENDENT_AMBULATORY_CARE_PROVIDER_SITE_OTHER): Payer: Medicare Other | Admitting: Internal Medicine

## 2022-08-11 DIAGNOSIS — D631 Anemia in chronic kidney disease: Secondary | ICD-10-CM

## 2022-08-11 DIAGNOSIS — I739 Peripheral vascular disease, unspecified: Secondary | ICD-10-CM

## 2022-08-11 DIAGNOSIS — Z89511 Acquired absence of right leg below knee: Secondary | ICD-10-CM

## 2022-08-11 DIAGNOSIS — E785 Hyperlipidemia, unspecified: Secondary | ICD-10-CM

## 2022-08-11 DIAGNOSIS — N186 End stage renal disease: Secondary | ICD-10-CM

## 2022-08-11 DIAGNOSIS — K5901 Slow transit constipation: Secondary | ICD-10-CM

## 2022-08-11 DIAGNOSIS — L8991 Pressure ulcer of unspecified site, stage 1: Secondary | ICD-10-CM

## 2022-08-11 DIAGNOSIS — E118 Type 2 diabetes mellitus with unspecified complications: Secondary | ICD-10-CM | POA: Diagnosis not present

## 2022-08-11 DIAGNOSIS — R1310 Dysphagia, unspecified: Secondary | ICD-10-CM

## 2022-08-11 DIAGNOSIS — G2581 Restless legs syndrome: Secondary | ICD-10-CM

## 2022-08-11 DIAGNOSIS — I6930 Unspecified sequelae of cerebral infarction: Secondary | ICD-10-CM

## 2022-08-11 DIAGNOSIS — Z89512 Acquired absence of left leg below knee: Secondary | ICD-10-CM

## 2022-08-11 DIAGNOSIS — Z992 Dependence on renal dialysis: Secondary | ICD-10-CM

## 2022-08-11 DIAGNOSIS — E1169 Type 2 diabetes mellitus with other specified complication: Secondary | ICD-10-CM

## 2022-08-11 DIAGNOSIS — I10 Essential (primary) hypertension: Secondary | ICD-10-CM

## 2022-08-11 DIAGNOSIS — N185 Chronic kidney disease, stage 5: Secondary | ICD-10-CM

## 2022-08-11 NOTE — Telephone Encounter (Signed)
Dr [name] will review and sign, and I will fax back to [facility].  Fernand Sorbello

## 2022-08-11 NOTE — Telephone Encounter (Signed)
Its in Dr. Oneal Deputy basket.  KP

## 2022-08-11 NOTE — Progress Notes (Signed)
Received home health orders orders from [facility]. Start of care 07/17/22.   Certification and orders from 07/17/22 through 09/14/22 are reviewed, signed and faxed back to home health company.  Need of intermittent skilled services at home: homebound  The home health care plan has been established by me and will be reviewed and updated as needed to maximize patient recovery.  I certify that all home health services have been and will be furnished to the patient while under my care.  Face-to-face encounter in which the need for home health services was established: 06/16/22  Patient is receiving home health services for the following diagnoses: Problem List Items Addressed This Visit       Cardiovascular and Mediastinum   Essential hypertension (Chronic)   PVD (peripheral vascular disease) (HCC) (Chronic)     Digestive   Slow transit constipation (Chronic)     Endocrine   Hyperlipidemia associated with type 2 diabetes mellitus ([place]) (Chronic)   Type II diabetes mellitus with complication ([place]) - Primary (Chronic)     Genitourinary   ESRD (end stage renal disease) on dialysis ([place]) (Chronic)     Other   Acquired absence of left leg below knee (HCC) (Chronic)   Acquired absence of right leg below knee (HCC) (Chronic)   History of stroke with current residual effects (Chronic)   Other Visit Diagnoses     Dysphagia, unspecified type       Healing pressure injury, stage 1       Anemia of chronic renal failure, stage 5 ([place])       Restless legs syndrome            Halina Maidens, MD

## 2022-08-15 ENCOUNTER — Telehealth: Payer: Self-pay | Admitting: Internal Medicine

## 2022-08-15 NOTE — Telephone Encounter (Signed)
Copied from [place] [phone]. Topic: General - Other >> Aug 15, 2022 10:41 AM Cyndi Bender wrote: Reason for CRM: Pt stated the medical notes were received but a new Rx for prosthetic is needed so they can begin to make her prosthetics. Pt asked that a new Rx for prosthetics be sent asap. Cb# [phone]

## 2022-08-16 ENCOUNTER — Telehealth: Payer: Self-pay | Admitting: Internal Medicine

## 2022-08-16 NOTE — Telephone Encounter (Signed)
Copied from [place] [phone]. Topic: General - Other >> Aug 16, 2022  4:44 PM Everette C wrote: Reason for CRM: The patient has called to request a copy of their previously requested letter related to their neurology concerns be mailed to them at home  Please contact the patient further when possible

## 2022-08-17 NOTE — Telephone Encounter (Signed)
Mailed letter to patient.  Cindy Osborne

## 2022-08-18 NOTE — Telephone Encounter (Addendum)
Patient states [facility] is awaiting an order from PCP regarding patient prosthetic.Patient would like a follow up call in regards to the status.

## 2022-08-18 NOTE — Telephone Encounter (Signed)
Spoke with patient and then called [facility] and spoke with [facility]. Patient is in need of a face to face visit with supported documentation for the need of new prosthetics.  Patient scheduled for Monday at 10:20.

## 2022-08-21 ENCOUNTER — Encounter: Payer: Self-pay | Admitting: Internal Medicine

## 2022-08-21 ENCOUNTER — Ambulatory Visit (INDEPENDENT_AMBULATORY_CARE_PROVIDER_SITE_OTHER): Payer: Medicare Other | Admitting: Internal Medicine

## 2022-08-21 VITALS — BP 128/74 | HR 85 | Ht 69.0 in | Wt 205.0 lb

## 2022-08-21 DIAGNOSIS — F419 Anxiety disorder, unspecified: Secondary | ICD-10-CM | POA: Diagnosis not present

## 2022-08-21 DIAGNOSIS — Z89512 Acquired absence of left leg below knee: Secondary | ICD-10-CM | POA: Diagnosis not present

## 2022-08-21 DIAGNOSIS — I739 Peripheral vascular disease, unspecified: Secondary | ICD-10-CM

## 2022-08-21 DIAGNOSIS — Z89511 Acquired absence of right leg below knee: Secondary | ICD-10-CM

## 2022-08-21 DIAGNOSIS — N186 End stage renal disease: Secondary | ICD-10-CM

## 2022-08-21 DIAGNOSIS — E118 Type 2 diabetes mellitus with unspecified complications: Secondary | ICD-10-CM

## 2022-08-21 DIAGNOSIS — Z992 Dependence on renal dialysis: Secondary | ICD-10-CM

## 2022-08-21 DIAGNOSIS — I480 Paroxysmal atrial fibrillation: Secondary | ICD-10-CM

## 2022-08-21 DIAGNOSIS — I5032 Chronic diastolic (congestive) heart failure: Secondary | ICD-10-CM

## 2022-08-21 MED ORDER — HYDROXYZINE HCL 10 MG PO TABS: 10.0000 mg | ORAL_TABLET | Freq: Three times a day (TID) | ORAL | 0 refills | Status: DC | PRN

## 2022-08-21 NOTE — Progress Notes (Signed)
Date:  08/21/2022   Name:  Cindy Osborne   DOB:  March 10, 1966   MRN:  [id number]   Chief Complaint: Below the Knee Bilateral Amputation and Referral (Cardiology. Was told to see cardiology to take over blood thinner from neurology. )  HPI Patient is a bilateral below knee amputee. She has experienced some volume changes in her residual limbs due to weight loss making her current sockets fit too loose.  Especially the right one which is so loose it will fall off despite multiple layers. Patient verbally communicates a strong desire to get new prosthetic sockets. She will benefit from replacement sockets to improve her fit and comfort. New sockets will also allow patient to walk. Her current sockets are keeping her from walking because they no longer fit due to her weight loss.  Lab Results  Component Value Date   NA 137 06/03/2022   K 3.5 06/03/2022   CO2 26 06/03/2022   GLUCOSE 114 (H) 06/03/2022   BUN 32 (H) 06/03/2022   CREATININE 6.47 (H) 06/03/2022   CALCIUM 9.2 06/03/2022   GFRNONAA 7 (L) 06/03/2022   Lab Results  Component Value Date   CHOL 107 03/22/2022   HDL 35 (L) 03/22/2022   LDLCALC 55 03/22/2022   TRIG 84 03/22/2022   CHOLHDL 3.1 03/22/2022   Lab Results  Component Value Date   TSH 2.583 05/28/2022   Lab Results  Component Value Date   HGBA1C 4.9 06/01/2022   Lab Results  Component Value Date   WBC 7.3 06/03/2022   HGB 9.2 (L) 06/03/2022   HCT 29.4 (L) 06/03/2022   MCV 93.9 06/03/2022   PLT 168 06/03/2022   Lab Results  Component Value Date   ALT 25 06/16/2022   AST 19 06/16/2022   ALKPHOS 149 (H) 06/16/2022   BILITOT 1.3 (H) 06/16/2022   No results found for: "25OHVITD2", "25OHVITD3", "VD25OH"   Review of Systems  Constitutional:  Negative for chills, fatigue and fever.  HENT:  Negative for trouble swallowing.   Eyes:  Negative for visual disturbance.  Respiratory:  Negative for chest tightness and shortness of breath.   Cardiovascular:   Negative for chest pain and palpitations.  Gastrointestinal:  Negative for abdominal pain, diarrhea and vomiting.  Musculoskeletal:  Positive for gait problem.  Neurological:  Positive for weakness. Negative for dizziness, light-headedness and headaches.  Psychiatric/Behavioral:  Positive for dysphoric mood. Negative for sleep disturbance. The patient is nervous/anxious.     Patient Active Problem List   Diagnosis Date Noted   Sepsis ([place]) 05/31/2022   Severe sepsis with septic shock ([place]) 05/30/2022   Type II diabetes mellitus with renal manifestations ([place]) 05/30/2022   HLD (hyperlipidemia) 05/30/2022   Obesity with body mass index (BMI) of 30.0 to 39.9 05/30/2022   Abnormal LFTs 05/30/2022   Chronic diastolic CHF (congestive heart failure) ([place]) 05/30/2022   Thyroid nodule 05/30/2022   Pleural effusion on right 05/30/2022   PAF (paroxysmal atrial fibrillation) ([place]) 05/30/2022   ESRD on hemodialysis (HCC)    Intractable nausea and vomiting 05/27/2022   Weakness of left upper extremity 05/11/2022   History of stroke with current residual effects 05/08/2022   Mood disorder ([place]) 05/08/2022   Pressure injury of skin 04/05/2022   Obesity 01/24/2022   Acute right arterial ischemic stroke, MCA (middle cerebral artery) ([place]) 01/21/2022   Combined forms of age-related cataract of both eyes 06/30/2021   Diabetic macular edema ([place]) 06/30/2021   Proteinuria 07/37/1062   Metabolic acidosis  05/05/2021   Encounter for insertion of mirena IUD 03/15/2021   Panic disorder 03/03/2021   Acquired absence of left leg below knee ([place]) 10/26/2020   PVD (peripheral vascular disease) ([place]) 07/28/2020   Uncontrolled type 2 diabetes mellitus with diabetic peripheral angiopathy without gangrene 02/26/2020   Acquired absence of right leg below knee ([place]) 07/28/2019   Diabetic retinopathy ([place]) 11/23/2016   Slow transit constipation 09/24/2015   Environmental and seasonal allergies 07/09/2015   Anemia  in ESRD (end-stage renal disease) ([place]) 07/09/2015   ESRD (end stage renal disease) on dialysis ([place]) 07/09/2015   Type II diabetes mellitus with complication ([place]) 40/98/1191   Herpes simplex infection 07/09/2015   Hyperlipidemia associated with type 2 diabetes mellitus ([place]) 07/09/2015   Heart & renal disease, hypertensive, with heart failure ([place]) 07/09/2015   Avitaminosis D 07/09/2015   Obstructive apnea 12/15/2014   Essential hypertension 09/11/2013    Allergies  Allergen Reactions   Augmentin [Amoxicillin-Pot Clavulanate] Shortness Of Breath   Atorvastatin Other (See Comments)    Back pain   Pioglitazone Other (See Comments)    Edema   Peanut-Containing Drug Products Itching   Rosuvastatin Itching and Other (See Comments)    Back pain   Shellfish-Derived Products Itching and Rash    shrimp   Sulfa Antibiotics Rash and Other (See Comments)    Shedding of skin    Past Surgical History:  Procedure Laterality Date   angioplasty politeal Left 07/2020   avg for dialysis     CHOLECYSTECTOMY     COLONOSCOPY  08/15/2011   cleared for 10 yrs   IR CT HEAD LTD  01/21/2022   IR CT HEAD LTD  03/21/2022   IR PERCUTANEOUS ART THROMBECTOMY/INFUSION INTRACRANIAL INC DIAG ANGIO  01/21/2022   IR PERCUTANEOUS ART THROMBECTOMY/INFUSION INTRACRANIAL INC DIAG ANGIO  03/21/2022   IR US GUIDE VASC ACCESS RIGHT  01/21/2022   IR US GUIDE VASC ACCESS RIGHT  03/21/2022   LEG AMPUTATION BELOW KNEE Right 04/2019   LEG AMPUTATION BELOW KNEE Left 09/08/2020   RADIOLOGY WITH ANESTHESIA N/A 01/21/2022   Procedure: IR WITH ANESTHESIA;  Surgeon: Radiologist, Medication, MD;  Location: [facility];  Service: Radiology;  Laterality: N/A;   RADIOLOGY WITH ANESTHESIA N/A 03/21/2022   Procedure: IR WITH ANESTHESIA;  Surgeon: Radiologist, Medication, MD;  Location: [facility];  Service: Radiology;  Laterality: N/A;   TOE AMPUTATION Right 10/15/2018    Social History   Tobacco Use   Smoking status: Never   Smokeless tobacco:  Never  Vaping Use   Vaping Use: Never used  Substance Use Topics   Alcohol use: Yes   Drug use: Never     Medication list has been reviewed and updated.  Current Meds  Medication Sig   apixaban (ELIQUIS) 2.5 MG TABS tablet Take 1 tablet (2.5 mg total) by mouth 2 (two) times daily.   hydrOXYzine (ATARAX) 10 MG tablet Take 1 tablet (10 mg total) by mouth 3 (three) times daily as needed for anxiety or itching.   liver oil-zinc oxide (DESITIN) 40 % ointment Apply topically 2 (two) times daily. To buttocks   midodrine (PROAMATINE) 5 MG tablet Take 1 tablet (5 mg total) by mouth 3 (three) times daily with meals.   oxyCODONE (OXY IR/ROXICODONE) 5 MG immediate release tablet Take 1 tablet (5 mg total) by mouth every 6 (six) hours as needed for moderate pain or severe pain.       08/21/2022    9:51 AM 05/08/2022  9:50 AM 02/06/2022    2:07 PM 09/14/2021    2:43 PM  GAD 7 : Generalized Anxiety Score  Nervous, Anxious, on Edge 2 0 1 3  Control/stop worrying 1 0 0 0  Worry too much - different things 1 0 0 1  Trouble relaxing 1 0 0 3  Restless 1 0 0 3  Easily annoyed or irritable 1 0 0 3  Afraid - awful might happen 1 0 0 0  Total GAD 7 Score 8 0 1 13  Anxiety Difficulty Somewhat difficult Not difficult at all Not difficult at all Not difficult at all       08/21/2022    9:51 AM 05/08/2022    9:50 AM 04/21/2022    1:03 PM  Depression screen PHQ 2/9  Decreased Interest 0 2 3  Down, Depressed, Hopeless 0 0 0  PHQ - 2 Score 0 2 3  Altered sleeping 0 1 0  Tired, decreased energy 0 0 2  Change in appetite 1 0 0  Feeling bad or failure about yourself  1 0 2  Trouble concentrating 1 0 2  Moving slowly or fidgety/restless 1 0 3  Suicidal thoughts 1 0 0  PHQ-9 Score 5 3 12   Difficult doing work/chores Somewhat difficult Not difficult at all Somewhat difficult    BP Readings from Last 3 Encounters:  08/21/22 128/74  07/10/22 126/76  06/16/22 128/79    Physical Exam Vitals and  nursing note reviewed.  Constitutional:      General: She is not in acute distress.    Appearance: She is well-developed.  HENT:     Head: Normocephalic and atraumatic.  Neck:     Vascular: No carotid bruit.  Cardiovascular:     Rate and Rhythm: Normal rate and regular rhythm.     Heart sounds: No murmur heard. Pulmonary:     Effort: Pulmonary effort is normal. No respiratory distress.     Breath sounds: No wheezing or rhonchi.  Musculoskeletal:     Cervical back: Normal range of motion.     Comments: Bilateral amputations with prostheses in place - very loose fitting despite stump wraps  Lymphadenopathy:     Cervical: No cervical adenopathy.  Skin:    General: Skin is warm and dry.     Findings: No rash.  Neurological:     Mental Status: She is alert and oriented to person, place, and time.     Motor: Weakness present.     Gait: Gait abnormal (gait not tested, in WC).     Comments: Left sided UE and LE weakness stable s/p CVA  Psychiatric:        Mood and Affect: Mood normal.        Behavior: Behavior normal.     Wt Readings from Last 3 Encounters:  08/21/22 205 lb (93 kg)  07/10/22 192 lb 0.3 oz (87.1 kg)  06/16/22 219 lb (99.3 kg)    BP 128/74   Pulse 85   Ht 5\' 9"  (1.753 m)   Wt 205 lb (93 kg)   SpO2 97%   BMI 30.27 kg/m   Assessment and Plan: 1. Acquired absence of right leg below knee [facility]) Refer for refitting of prosthesis  2. Acquired absence of left leg below knee [facility]) Refer for refitting of prosthesis  3. Anxiety disorder, unspecified type Symptoms controlled with PRN Atarax - hydrOXYzine (ATARAX) 10 MG tablet; Take 1 tablet (10 mg total) by mouth 3 (three) times daily as  needed for anxiety or itching.  Dispense: 120 tablet; Refill: 0  4. Chronic diastolic CHF (congestive heart failure) ([place]) Needs to establish with Cardiology - Ambulatory referral to Cardiology  5. PAF (paroxysmal atrial fibrillation) (HCC) On Eliquis sub-therapeutic dose due  to excessive bleeding in HD - Ambulatory referral to Cardiology  6. ESRD (end stage renal disease) on dialysis ([place]) On HD TThSat and tolerating well.  7. Type II diabetes mellitus with complication (HCC) Stable controlled on diet alone.  8. PVD (peripheral vascular disease) ([place]) S/p amputations   Partially dictated using Editor, commissioning. Any errors are unintentional.  Halina Maidens, MD [facility]  08/21/2022

## 2022-08-22 ENCOUNTER — Ambulatory Visit: Payer: Federal, State, Local not specified - PPO | Admitting: Internal Medicine

## 2022-09-01 ENCOUNTER — Telehealth: Payer: Self-pay | Admitting: Internal Medicine

## 2022-09-01 NOTE — Telephone Encounter (Signed)
Copied from [place] [phone]. Topic: General - Other >> Sep 01, 2022  2:30 PM Sabas Sous wrote: Reason for CRM: Missed PT visit with Home Health 09/01/2022  Pt is having a procedure on her dialysis port today in [place], this is why she had to miss her therapy visit  [facility] contact: [phone]

## 2022-09-08 ENCOUNTER — Telehealth: Payer: Self-pay | Admitting: Internal Medicine

## 2022-09-08 NOTE — Telephone Encounter (Signed)
Noted  KP 

## 2022-09-08 NOTE — Telephone Encounter (Signed)
Copied from [place] [phone]. Topic: Quick Communication - Home Health Verbal Orders >> Sep 08, 2022  3:22 PM Everette C wrote: Caller/Agency: Bess Harvest / Isaac Laud Number: [phone]  Missed PT visit scheduled for 09/08/22 at 9 AM   The patient was unable to have PT due to having to go to Dialysis

## 2022-09-11 ENCOUNTER — Other Ambulatory Visit: Payer: Self-pay | Admitting: Neurology

## 2022-09-11 NOTE — Telephone Encounter (Signed)
Medication Refill - Medication: apixaban (ELIQUIS) 2.5 MG TABS tablet   Has the patient contacted their pharmacy? yes (Agent: If no, request that the patient contact the pharmacy for the refill. If patient does not wish to contact the pharmacy document the reason why and proceed with request.) (Agent: If yes, when and what did the pharmacy advise?)  Preferred Pharmacy (with phone number or street name): [id number] - [place], [place] [address], [place] [address] Phone: [phone]  Fax: [phone]  Has the patient been seen for an appointment in the last year OR does the patient have an upcoming appointment? Yes.    Agent: Please be advised that RX refills may take up to 3 business days. We ask that you follow-up with your pharmacy.

## 2022-09-16 ENCOUNTER — Other Ambulatory Visit: Payer: Self-pay | Admitting: Internal Medicine

## 2022-09-16 DIAGNOSIS — F419 Anxiety disorder, unspecified: Secondary | ICD-10-CM

## 2022-09-27 ENCOUNTER — Ambulatory Visit: Payer: Medicare Other | Attending: Cardiology | Admitting: Cardiology

## 2022-09-27 ENCOUNTER — Encounter: Payer: Self-pay | Admitting: Cardiology

## 2022-09-27 VITALS — BP 138/64 | HR 81 | Ht 69.0 in | Wt 209.0 lb

## 2022-09-27 DIAGNOSIS — I5032 Chronic diastolic (congestive) heart failure: Secondary | ICD-10-CM | POA: Insufficient documentation

## 2022-09-27 DIAGNOSIS — Z01810 Encounter for preprocedural cardiovascular examination: Secondary | ICD-10-CM | POA: Insufficient documentation

## 2022-09-27 DIAGNOSIS — I480 Paroxysmal atrial fibrillation: Secondary | ICD-10-CM | POA: Insufficient documentation

## 2022-09-27 NOTE — Patient Instructions (Signed)
Medication Instructions:   Your physician recommends that you continue on your current medications as directed. Please refer to the Current Medication list given to you today.  *If you need a refill on your cardiac medications before your next appointment, please call your pharmacy*   Lab Work:  None ordered  If you have labs (blood work) drawn today and your tests are completely normal, you will receive your results only by: [place] (if you have MyChart) OR A paper copy in the mail If you have any lab test that is abnormal or we need to change your treatment, we will call you to review the results.   Testing/Procedures:  None Ordered   Follow-Up: At [facility], you and your health needs are our priority.  As part of our continuing mission to provide you with exceptional heart care, we have created designated Provider Care Teams.  These Care Teams include your primary Cardiologist (physician) and Advanced Practice Providers (APPs -  Physician Assistants and Nurse Practitioners) who all work together to provide you with the care you need, when you need it.  We recommend signing up for the patient portal called "MyChart".  Sign up information is provided on this After Visit Summary.  MyChart is used to connect with patients for Virtual Visits (Telemedicine).  Patients are able to view lab/test results, encounter notes, upcoming appointments, etc.  Non-urgent messages can be sent to your provider as well.   To learn more about what you can do with MyChart, go to [email or web address].    Your next appointment:   6 month(s)  Provider:   You may see Kate Sable, MD or one of the following Advanced Practice Providers on your designated Care Team:   Murray Hodgkins, NP Christell Faith, PA-C Cadence Kathlen Mody, PA-C Gerrie Nordmann, NP

## 2022-09-27 NOTE — Progress Notes (Signed)
Cardiology Office Note:    Date:  09/27/2022   ID:  Cindy Osborne, DOB 02/03/1966, MRN [id number]  PCP:  Glean Hess, MD   [facility] Providers Cardiologist:  Kate Sable, MD     Referring MD: Glean Hess, MD   No chief complaint on file.   History of Present Illness:    Cindy Osborne is a 57 y.o. female with a hx of paroxysmal atrial fibrillation, hypertension, hyperlipidemia, diabetes, CVA x 2 (6/23, 03/2022), PAD s/p bilateral BKA, end-stage renal disease on HD TTS who presents to establish care.  Initially evaluated by [facility] cardiology 03/2022 after admission for a stroke.  EKG showed atrial fibrillation which was managed with Cardizem, eventually metoprolol.  Eliquis was started.  Blood pressures were running low during dialysis sessions, midodrine was started.  She had significant bleeding during dialysis, Eliquis was reduced to 2.5 mg twice daily.  She has not had any further stroke symptoms.  Denies blood in stool or urine.  Planning on having dental work performed in the near future.  Echo 01/2022 EF 55 to [id number], grade 2 diastolic dysfunction.  Past Medical History:  Diagnosis Date   Acute ischemic right MCA stroke ([place]) 03/25/2022   Amputated toe ([place]) 01/31/2012   Right second toe distal phalanx Right great toe   Diabetes mellitus without complication ([place])    History of diabetic ulcer of foot 04/01/2014   Hyperlipidemia    Hypertension    Stroke (cerebrum) ([place]) 03/21/2022   Stroke ([place]) 01/21/2022    Past Surgical History:  Procedure Laterality Date   angioplasty politeal Left 07/2020   avg for dialysis     CHOLECYSTECTOMY     COLONOSCOPY  08/15/2011   cleared for 10 yrs   IR CT HEAD LTD  01/21/2022   IR CT HEAD LTD  03/21/2022   IR PERCUTANEOUS ART THROMBECTOMY/INFUSION INTRACRANIAL INC DIAG ANGIO  01/21/2022   IR PERCUTANEOUS ART THROMBECTOMY/INFUSION INTRACRANIAL INC DIAG ANGIO  03/21/2022   IR US GUIDE VASC ACCESS RIGHT   01/21/2022   IR US GUIDE VASC ACCESS RIGHT  03/21/2022   LEG AMPUTATION BELOW KNEE Right 04/2019   LEG AMPUTATION BELOW KNEE Left 09/08/2020   RADIOLOGY WITH ANESTHESIA N/A 01/21/2022   Procedure: IR WITH ANESTHESIA;  Surgeon: Radiologist, Medication, MD;  Location: [facility];  Service: Radiology;  Laterality: N/A;   RADIOLOGY WITH ANESTHESIA N/A 03/21/2022   Procedure: IR WITH ANESTHESIA;  Surgeon: Radiologist, Medication, MD;  Location: [facility];  Service: Radiology;  Laterality: N/A;   TOE AMPUTATION Right 10/15/2018    Current Medications: Current Meds  Medication Sig   apixaban (ELIQUIS) 2.5 MG TABS tablet TAKE 1 TABLET BY MOUTH TWICE A DAY   midodrine (PROAMATINE) 5 MG tablet Take 1 tablet (5 mg total) by mouth 3 (three) times daily with meals.     Allergies:   Augmentin [amoxicillin-pot clavulanate], Atorvastatin, Pioglitazone, Peanut-containing drug products, Rosuvastatin, Shellfish-derived products, and Sulfa antibiotics   Social History   Socioeconomic History   Marital status: Divorced    Spouse name: Not on file   Number of children: Not on file   Years of education: Not on file   Highest education level: Not on file  Occupational History   Not on file  Tobacco Use   Smoking status: Never   Smokeless tobacco: Never  Vaping Use   Vaping Use: Never used  Substance and Sexual Activity   Alcohol use: Yes   Drug use: Never   Sexual  activity: Yes    Partners: Male  Other Topics Concern   Not on file  Social History Narrative   Not on file   Social Determinants of Health   Financial Resource Strain: Low Risk  (02/06/2022)   Overall Financial Resource Strain (CARDIA)    Difficulty of Paying Living Expenses: Not hard at all  Food Insecurity: No Food Insecurity (05/31/2022)   Hunger Vital Sign    Worried About Running Out of Food in the Last Year: Never true    Ran Out of Food in the Last Year: Never true  Transportation Needs: No Transportation Needs (05/31/2022)   PRAPARE  - Hydrologist (Medical): No    Lack of Transportation (Non-Medical): No  Physical Activity: Not on file  Stress: Not on file  Social Connections: Not on file     Family History: The patient's family history includes Diabetes in her mother and sister.  ROS:   Please see the history of present illness.     All other systems reviewed and are negative.  EKGs/Labs/Other Studies Reviewed:    The following studies were reviewed today:   EKG:  EKG is  ordered today.  The ekg ordered today demonstrates normal sinus rhythm, heart rate 81  Recent Labs: 05/28/2022: B Natriuretic Peptide 1,942.0; TSH 2.583 05/31/2022: Magnesium 1.9 06/03/2022: BUN 32; Creatinine, Ser 6.47; Hemoglobin 9.2; Platelets 168; Potassium 3.5; Sodium 137 06/16/2022: ALT 25  Recent Lipid Panel    Component Value Date/Time   CHOL 107 03/22/2022 0243   CHOL 168 03/03/2021 0858   TRIG 84 03/22/2022 0243   HDL 35 (L) 03/22/2022 0243   HDL 35 (L) 03/03/2021 0858   CHOLHDL 3.1 03/22/2022 0243   VLDL 17 03/22/2022 0243   LDLCALC 55 03/22/2022 0243   LDLCALC 113 (H) 03/03/2021 0858     Risk Assessment/Calculations:             Physical Exam:    VS:  BP 138/64   Pulse 81   Ht 5' 9"$  (1.753 m)   Wt 209 lb (94.8 kg)   BMI 30.86 kg/m     Wt Readings from Last 3 Encounters:  09/27/22 209 lb (94.8 kg)  08/21/22 205 lb (93 kg)  07/10/22 192 lb 0.3 oz (87.1 kg)     GEN:  Well nourished, well developed in no acute distress HEENT: Normal NECK: No JVD; No carotid bruits CARDIAC: RRR, no murmurs RESPIRATORY: Diminished breath sounds, otherwise clear ABDOMEN: Soft, non-tender, non-distended MUSCULOSKELETAL:   bilateral BKA noted SKIN: Warm and dry NEUROLOGIC:  Alert and oriented x 3, left arm weakness PSYCHIATRIC:  Normal affect   ASSESSMENT:    1. Paroxysmal atrial fibrillation (HCC)   2. Chronic heart failure with preserved ejection fraction ([place])   3. Pre-procedural  cardiovascular examination    PLAN:    In order of problems listed above:  Paroxysmal atrial fibrillation, history of CVA x 2.  Continue Eliquis.  Holding AV nodal agents due to low BP during dialysis requiring midodrine. HFpEF, appears euvolemic, volume control with dialysis. Preprocedure eval, dental work being planned.  Okay to hold Eliquis 48 hours prior to procedure, restart as soon as safely possible after.   Follow-up in 6 months.      Medication Adjustments/Labs and Tests Ordered: Current medicines are reviewed at length with the patient today.  Concerns regarding medicines are outlined above.  Orders Placed This Encounter  Procedures   EKG 12-Lead   No orders  of the defined types were placed in this encounter.   Patient Instructions  Medication Instructions:   Your physician recommends that you continue on your current medications as directed. Please refer to the Current Medication list given to you today.  *If you need a refill on your cardiac medications before your next appointment, please call your pharmacy*   Lab Work:  None ordered  If you have labs (blood work) drawn today and your tests are completely normal, you will receive your results only by: [place] (if you have MyChart) OR A paper copy in the mail If you have any lab test that is abnormal or we need to change your treatment, we will call you to review the results.   Testing/Procedures:  None Ordered   Follow-Up: At [facility], you and your health needs are our priority.  As part of our continuing mission to provide you with exceptional heart care, we have created designated Provider Care Teams.  These Care Teams include your primary Cardiologist (physician) and Advanced Practice Providers (APPs -  Physician Assistants and Nurse Practitioners) who all work together to provide you with the care you need, when you need it.  We recommend signing up for the patient portal called  "MyChart".  Sign up information is provided on this After Visit Summary.  MyChart is used to connect with patients for Virtual Visits (Telemedicine).  Patients are able to view lab/test results, encounter notes, upcoming appointments, etc.  Non-urgent messages can be sent to your provider as well.   To learn more about what you can do with MyChart, go to [email or web address].    Your next appointment:   6 month(s)  Provider:   You may see Kate Sable, MD or one of the following Advanced Practice Providers on your designated Care Team:   Murray Hodgkins, NP Christell Faith, PA-C Cadence Kathlen Mody, PA-C Gerrie Nordmann, NP   Signed, Kate Sable, MD  09/27/2022 10:07 AM    [place]

## 2022-10-26 ENCOUNTER — Telehealth: Payer: Self-pay

## 2022-10-26 NOTE — Telephone Encounter (Signed)
Called pt spoke to pt she stated she would call back to schedule.  KP

## 2022-11-08 ENCOUNTER — Ambulatory Visit (INDEPENDENT_AMBULATORY_CARE_PROVIDER_SITE_OTHER): Payer: Medicare Other

## 2022-11-08 ENCOUNTER — Other Ambulatory Visit: Payer: Self-pay

## 2022-11-08 VITALS — Ht 69.0 in | Wt 209.0 lb

## 2022-11-08 DIAGNOSIS — Z0000 Encounter for general adult medical examination without abnormal findings: Secondary | ICD-10-CM

## 2022-11-08 DIAGNOSIS — Z1231 Encounter for screening mammogram for malignant neoplasm of breast: Secondary | ICD-10-CM

## 2022-11-08 DIAGNOSIS — Z89511 Acquired absence of right leg below knee: Secondary | ICD-10-CM

## 2022-11-08 DIAGNOSIS — Z89512 Acquired absence of left leg below knee: Secondary | ICD-10-CM

## 2022-11-08 NOTE — Progress Notes (Signed)
Subjective:   Cindy Osborne is a 57 y.o. female who presents for Medicare Annual (Subsequent) preventive examination.  I connected with  Rudene Anda on 11/08/22 by a audio enabled telemedicine application and verified that I am speaking with the correct person using two identifiers.  Patient Location: Home  Provider Location: Office/Clinic  I discussed the limitations of evaluation and management by telemedicine. The patient expressed understanding and agreed to proceed.   Review of Systems    DEFER TO PCP   Cardiac Risk Factors include: advanced age (>14men, >57 women);obesity (BMI >30kg/m2);diabetes mellitus     Objective:    Today's Vitals   11/08/22 1019  Weight: 209 lb (94.8 kg)  Height: 5\' 9"  (1.753 m)   Body mass index is 30.86 kg/m.     11/08/2022   10:22 AM 05/31/2022    7:00 AM 05/30/2022    7:52 AM 05/27/2022    4:23 PM 05/23/2022    2:15 PM 03/25/2022    3:14 PM 03/23/2022   12:00 AM  Advanced Directives  Does Patient Have a Medical Advance Directive? Yes Yes Yes No No Yes No  Type of Corporate treasurer of [place];Living will   Living will;Healthcare Power of Attorney   Does patient want to make changes to medical advance directive? No - Guardian declined No - Patient declined    No - Patient declined   Copy of [place] in Chart?  No - copy requested    Yes - validated most recent copy scanned in chart (See row information)   Would patient like information on creating a medical advance directive?  No - Patient declined  No - Patient declined No - Patient declined No - Patient declined No - Patient declined    Current Medications (verified) Outpatient Encounter Medications as of 11/08/2022  Medication Sig   amLODipine (NORVASC) 10 MG tablet Take 10 mg by mouth daily.   apixaban (ELIQUIS) 2.5 MG TABS tablet TAKE 1 TABLET BY MOUTH TWICE A DAY   BD PEN NEEDLE NANO 2ND GEN 32G X 4 MM  MISC    Insulin Glargine (BASAGLAR KWIKPEN) 100 UNIT/ML Inject into the skin.   midodrine (PROAMATINE) 5 MG tablet Take 1 tablet (5 mg total) by mouth 3 (three) times daily with meals.   montelukast (SINGULAIR) 10 MG tablet Take 10 mg by mouth daily.   torsemide (DEMADEX) 100 MG tablet Take by mouth.   No facility-administered encounter medications on file as of 11/08/2022.    Allergies (verified) Augmentin [amoxicillin-pot clavulanate], Atorvastatin, Pioglitazone, Peanut-containing drug products, Rosuvastatin, Shellfish-derived products, and Sulfa antibiotics   History: Past Medical History:  Diagnosis Date   Acute ischemic right MCA stroke ([place]) 03/25/2022   Amputated toe ([place]) 01/31/2012   Right second toe distal phalanx Right great toe   Diabetes mellitus without complication ([place])    History of diabetic ulcer of foot 04/01/2014   Hyperlipidemia    Hypertension    Stroke (cerebrum) ([place]) 03/21/2022   Stroke ([place]) 01/21/2022   Past Surgical History:  Procedure Laterality Date   angioplasty politeal Left 07/2020   avg for dialysis     CHOLECYSTECTOMY     COLONOSCOPY  08/15/2011   cleared for 10 yrs   IR CT HEAD LTD  01/21/2022   IR CT HEAD LTD  03/21/2022   IR PERCUTANEOUS ART THROMBECTOMY/INFUSION INTRACRANIAL INC DIAG ANGIO  01/21/2022   IR PERCUTANEOUS ART THROMBECTOMY/INFUSION INTRACRANIAL INC DIAG ANGIO  03/21/2022   IR US GUIDE VASC ACCESS RIGHT  01/21/2022   IR US GUIDE VASC ACCESS RIGHT  03/21/2022   LEG AMPUTATION BELOW KNEE Right 04/2019   LEG AMPUTATION BELOW KNEE Left 09/08/2020   RADIOLOGY WITH ANESTHESIA N/A 01/21/2022   Procedure: IR WITH ANESTHESIA;  Surgeon: Radiologist, Medication, MD;  Location: [facility];  Service: Radiology;  Laterality: N/A;   RADIOLOGY WITH ANESTHESIA N/A 03/21/2022   Procedure: IR WITH ANESTHESIA;  Surgeon: Radiologist, Medication, MD;  Location: [facility];  Service: Radiology;  Laterality: N/A;   TOE AMPUTATION Right 10/15/2018   Family History   Problem Relation Age of Onset   Diabetes Mother    Diabetes Sister    Social History   Socioeconomic History   Marital status: Divorced    Spouse name: Not on file   Number of children: Not on file   Years of education: Not on file   Highest education level: Not on file  Occupational History   Not on file  Tobacco Use   Smoking status: Never   Smokeless tobacco: Never  Vaping Use   Vaping Use: Never used  Substance and Sexual Activity   Alcohol use: Yes   Drug use: Never   Sexual activity: Yes    Partners: Male  Other Topics Concern   Not on file  Social History Narrative   Not on file   Social Determinants of Health   Financial Resource Strain: Low Risk  (11/08/2022)   Overall Financial Resource Strain (CARDIA)    Difficulty of Paying Living Expenses: Not hard at all  Food Insecurity: No Food Insecurity (11/08/2022)   Hunger Vital Sign    Worried About Running Out of Food in the Last Year: Never true    Ran Out of Food in the Last Year: Never true  Transportation Needs: No Transportation Needs (05/31/2022)   PRAPARE - Hydrologist (Medical): No    Lack of Transportation (Non-Medical): No  Physical Activity: Inactive (11/08/2022)   Exercise Vital Sign    Days of Exercise per Week: 0 days    Minutes of Exercise per Session: 0 min  Stress: No Stress Concern Present (11/08/2022)   [place]    Feeling of Stress : Only a little  Social Connections: Unknown (11/08/2022)   Social Connection and Isolation Panel [NHANES]    Frequency of Communication with Friends and Family: Patient declined    Frequency of Social Gatherings with Friends and Family: Patient declined    Attends Religious Services: Patient declined    Marine scientist or Organizations: Not on file    Attends Archivist Meetings: Patient declined    Marital Status: Patient declined    Tobacco  Counseling Counseling given: Not Answered   Clinical Intake:  Pre-visit preparation completed: No  Pain : No/denies pain     BMI - recorded: 30.86 Nutritional Status: BMI > 30  Obese Nutritional Risks: None Diabetes: Yes  How often do you need to have someone help you when you read instructions, pamphlets, or other written materials from your doctor or pharmacy?: 1 - Never  Diabetic? Yes.  Interpreter Needed?: No  Information entered by :: Wyatt Haste, [place] of Daily Living    11/08/2022   10:23 AM 05/31/2022    7:00 AM  In your present state of health, do you have any difficulty performing the following activities:  Hearing? 0 0  Vision? 0 0  Difficulty concentrating or making decisions? 0 0  Walking or climbing stairs? 1 1  Dressing or bathing? 1 0  Doing errands, shopping? 1 0  Preparing Food and eating ? Y   Using the Toilet? Y   In the past six months, have you accidently leaked urine? N   Do you have problems with loss of bowel control? N   Managing your Medications? N   Managing your Finances? N   Housekeeping or managing your Housekeeping? Y     Patient Care Team: Glean Hess, MD as PCP - General (Internal Medicine) Kate Sable, MD as PCP - Cardiology (Cardiology) Erasmo Downer (Ophthalmology) Wyatt Haste, MD as Referring Physician (Obstetrics and Gynecology) Sharma Covert, MD as Referring Physician (Nephrology) Lonia Farber, MD as Consulting Physician (Endocrinology)  Indicate any recent Medical Services you may have received from other than [facility] providers in the past year (date may be approximate).     Assessment:   This is a routine wellness examination for [place].  Hearing/Vision screen Hearing Screening - Comments:: No concerns. Vision Screening - Comments:: No Concerns.  Dietary issues and exercise activities discussed: Current Exercise Habits: The patient does not participate in regular exercise  at present, Exercise limited by: orthopedic condition(s)   Goals Addressed   None   Depression Screen    11/08/2022   10:22 AM 08/21/2022    9:51 AM 05/08/2022    9:50 AM 04/21/2022    1:03 PM 02/06/2022    2:06 PM 09/14/2021    2:42 PM 06/09/2021   11:03 AM  PHQ 2/9 Scores  PHQ - 2 Score 4 0 2 3 1  0 0  PHQ- 9 Score 7 5 3 12 1 3 3     Fall Risk    11/08/2022   10:22 AM 08/21/2022    9:51 AM 05/08/2022    9:50 AM 02/06/2022    2:07 PM 09/14/2021    2:41 PM  Fall Risk   Falls in the past year? 0 0 0 0 0  Number falls in past yr: 0 0 0 0 0  Injury with Fall? 0 0 0 0 0  Risk for fall due to : History of fall(s) No Fall Risks No Fall Risks No Fall Risks Impaired balance/gait;Impaired mobility  Follow up Falls evaluation completed Falls evaluation completed Falls evaluation completed Falls evaluation completed Falls evaluation completed    FALL RISK PREVENTION PERTAINING TO THE HOME:  Any stairs in or around the home? No  If so, are there any without handrails?  N/A Home free of loose throw rugs in walkways, pet beds, electrical cords, etc? Yes  Adequate lighting in your home to reduce risk of falls? Yes   ASSISTIVE DEVICES UTILIZED TO PREVENT FALLS:  Life alert? No  Use of a cane, walker or w/c? Yes   TIMED UP AND GO:  Was the test performed? No .   Cognitive Function:        11/08/2022   10:23 AM  6CIT Screen  What Year? 0 points  What month? 0 points  What time? 0 points  Count back from 20 0 points  Months in reverse 0 points  Repeat phrase 0 points  Total Score 0 points    Immunizations Immunization History  Administered Date(s) Administered   Hepatitis B, ADULT 10/09/2019, 02/03/2020   Hepb-cpg 11/19/2021, 12/13/2021, 01/17/2022   Influenza Inj Mdck Quad With Preservative 05/27/2018   Influenza,inj,Quad PF,6+ Mos 04/09/2019, 05/27/2020, 05/08/2022  Influenza-Unspecified 06/10/2016, 04/23/2017, 05/13/2021   PFIZER Comirnaty(Gray Top)Covid-19 Tri-Sucrose  Vaccine 11/12/2019, 12/03/2019, 09/27/2020   PFIZER(Purple Top)SARS-COV-2 Vaccination 11/13/2019, 12/04/2019   Pneumococcal Polysaccharide-23 02/26/2019    TDAP status: Due, Education has been provided regarding the importance of this vaccine. Advised may receive this vaccine at local pharmacy or Health Dept. Aware to provide a copy of the vaccination record if obtained from local pharmacy or Health Dept. Verbalized acceptance and understanding.  Flu Vaccine status: Up to date  Pneumococcal vaccine status: Due, Education has been provided regarding the importance of this vaccine. Advised may receive this vaccine at local pharmacy or Health Dept. Aware to provide a copy of the vaccination record if obtained from local pharmacy or Health Dept. Verbalized acceptance and understanding.  Covid-19 vaccine status: Completed vaccines  Qualifies for Shingles Vaccine? Yes   Zostavax completed No   Shingrix Completed?: No.    Education has been provided regarding the importance of this vaccine. Patient has been advised to call insurance company to determine out of pocket expense if they have not yet received this vaccine. Advised may also receive vaccine at local pharmacy or Health Dept. Verbalized acceptance and understanding.  Screening Tests Health Maintenance  Topic Date Due   DTaP/Tdap/Td (1 - Tdap) Never done   Zoster Vaccines- Shingrix (1 of 2) Never done   Pneumococcal Vaccine 36-53 Years old (2 of 2 - PCV) 02/26/2020   PAP SMEAR-Modifier  10/04/2021   MAMMOGRAM  02/25/2022   OPHTHALMOLOGY EXAM  02/25/2022   COVID-19 Vaccine (6 - 2023-24 season) 04/14/2022   HEMOGLOBIN A1C  12/01/2022   Medicare Annual Wellness (AWV)  11/08/2023   COLONOSCOPY (Pts 45-56yrs Insurance coverage will need to be confirmed)  08/28/2024   INFLUENZA VACCINE  Completed   Hepatitis C Screening  Completed   HIV Screening  Completed   HPV VACCINES  Aged Out    Health Maintenance  Health Maintenance Due  Topic  Date Due   DTaP/Tdap/Td (1 - Tdap) Never done   Zoster Vaccines- Shingrix (1 of 2) Never done   Pneumococcal Vaccine 34-47 Years old (2 of 2 - PCV) 02/26/2020   PAP SMEAR-Modifier  10/04/2021   MAMMOGRAM  02/25/2022   OPHTHALMOLOGY EXAM  02/25/2022   COVID-19 Vaccine (6 - 2023-24 season) 04/14/2022    Colorectal cancer screening: Type of screening: Colonoscopy. Completed 08/28/2014. Repeat every 10 years  Mammogram status: Ordered TODAY. Pt provided with contact info and advised to call to schedule appt.    Lung Cancer Screening: (Low Dose CT Chest recommended if Age 33-80 years, 30 pack-year currently smoking OR have quit w/in 15years.) does not qualify.   Additional Screening:  Hepatitis C Screening: does not qualify  Vision Screening: Recommended annual ophthalmology exams for early detection of glaucoma and other disorders of the eye. Is the patient up to date with their annual eye exam?  Yes  Who is the provider or what is the name of the office in which the patient attends annual eye exams? Patient unsure of name of facility but says she goes to [place], [place] for this.  Dental Screening: Recommended annual dental exams for proper oral hygiene  Community Resource Referral / Chronic Care Management: CRR required this visit?  No   CCM required this visit?  No      Plan:     I have personally reviewed and noted the following in the patient's chart:   Medical and social history Use of alcohol, tobacco or illicit drugs  Current  medications and supplements including opioid prescriptions. Patient is not currently taking opioid prescriptions. Functional ability and status Nutritional status Physical activity Advanced directives List of other physicians Hospitalizations, surgeries, and ER visits in previous 12 months Vitals Screenings to include cognitive, depression, and falls Referrals and appointments  In addition, I have reviewed and discussed with patient certain  preventive protocols, quality metrics, and best practice recommendations. A written personalized care plan for preventive services as well as general preventive health recommendations were provided to patient.     Clista Bernhardt, [place]   11/08/2022   Nurse Notes: None.

## 2022-11-13 ENCOUNTER — Telehealth: Payer: Self-pay | Admitting: Internal Medicine

## 2022-11-13 NOTE — Addendum Note (Signed)
Addended by: Clista Bernhardt on: 11/13/2022 01:32 PM   Modules accepted: Orders

## 2022-11-13 NOTE — Telephone Encounter (Signed)
Copied from [place] [id number]. Topic: General - Other >> Nov 13, 2022 11:43 AM Denman George T wrote: Reason for CRM: Patient called stating she needs physical therapy with [facility] and their fax#[phone] to help her walk better and her balance. Attempted to schedule her an appt but the only available appt on a Wednesday was 4/24 and that was out too far for her.

## 2022-11-13 NOTE — Telephone Encounter (Signed)
Physical Therapy referral placed. Someone should call the patient to schedule.  Cindy Osborne

## 2022-11-27 ENCOUNTER — Telehealth: Payer: Self-pay | Admitting: Diagnostic Neuroimaging

## 2022-11-27 ENCOUNTER — Other Ambulatory Visit: Payer: Self-pay

## 2022-11-27 MED ORDER — APIXABAN 2.5 MG PO TABS: 2.5000 mg | ORAL_TABLET | Freq: Two times a day (BID) | ORAL | 2 refills | Status: DC

## 2022-11-27 NOTE — Telephone Encounter (Signed)
Patient Rx meds has been refilled.

## 2022-11-27 NOTE — Telephone Encounter (Signed)
Pt said she thinks the apixaban (ELIQUIS) 2.5 MG TABS tablet is taking out her hair. Pt is requesting a medication change to something that not going to take out her hair. Pt requesting a call back to discuss.

## 2022-11-30 ENCOUNTER — Telehealth: Payer: Self-pay | Admitting: Internal Medicine

## 2022-11-30 NOTE — Telephone Encounter (Signed)
Pt called in requesting PT.  KP

## 2022-11-30 NOTE — Telephone Encounter (Signed)
Home Health Verbal Orders - Caller/Agency: patient/Cindy Osborne Number: [phone]  Requesting PT Frequency: twice a week for 3 months

## 2022-12-01 ENCOUNTER — Other Ambulatory Visit: Payer: Self-pay

## 2022-12-01 ENCOUNTER — Other Ambulatory Visit: Payer: Self-pay | Admitting: Internal Medicine

## 2022-12-01 DIAGNOSIS — Z89512 Acquired absence of left leg below knee: Secondary | ICD-10-CM

## 2022-12-01 DIAGNOSIS — Z89511 Acquired absence of right leg below knee: Secondary | ICD-10-CM

## 2022-12-01 DIAGNOSIS — R2689 Other abnormalities of gait and mobility: Secondary | ICD-10-CM

## 2022-12-01 NOTE — Telephone Encounter (Signed)
Referral placed. Cindy Osborne  

## 2022-12-04 ENCOUNTER — Other Ambulatory Visit: Payer: Self-pay | Admitting: Internal Medicine

## 2022-12-04 NOTE — Telephone Encounter (Signed)
Medication Refill - Medication: midodrine (PROAMATINE) 5 MG tablet   Has the patient contacted their pharmacy? Yes.    Preferred Pharmacy (with phone number or street name): [id number] - [place], [place] - [address] Phone: [phone]  Fax: [phone]   Has the patient been seen for an appointment in the last year OR does the patient have an upcoming appointment? Yes.    Agent: Please be advised that RX refills may take up to 3 business days. We ask that you follow-up with your pharmacy.

## 2022-12-04 NOTE — Telephone Encounter (Signed)
Requested medication (s) are due for refill today: yes  Requested medication (s) are on the active medication list: yes  Last refill:  06/03/22 #90/3  Future visit scheduled: yes  Notes to clinic:  not delegated and Unable to refill per protocol, last refill by another provider.    Requested Prescriptions  Pending Prescriptions Disp Refills   midodrine (PROAMATINE) 5 MG tablet 90 tablet 3    Sig: Take 1 tablet (5 mg total) by mouth 3 (three) times daily with meals.     Not Delegated - Cardiovascular: Midodrine Failed - 12/04/2022 12:58 PM      Failed - This refill cannot be delegated      Failed - Cr in normal range and within 360 days    Creatinine, Ser  Date Value Ref Range Status  06/03/2022 6.47 (H) 0.44 - 1.00 mg/dL Final         Passed - ALT in normal range and within 360 days    ALT  Date Value Ref Range Status  06/16/2022 25 0 - 32 IU/L Final         Passed - AST in normal range and within 360 days    AST  Date Value Ref Range Status  06/16/2022 19 0 - 40 IU/L Final         Passed - Last BP in normal range    BP Readings from Last 1 Encounters:  09/27/22 138/64         Passed - Valid encounter within last 12 months    Recent Outpatient Visits           3 months ago Acquired absence of right leg below knee [facility])   [place] [facility] at [facility], Nyoka Cowden, MD   5 months ago Community acquired pneumonia of right lower lobe of lung   [place] [facility] at [facility], Nyoka Cowden, MD   7 months ago Nodule of left lobe of thyroid gland   [place] [facility] at [facility], Nyoka Cowden, MD   10 months ago Acute right arterial ischemic stroke, MCA (middle cerebral artery) [facility])   [place] [facility] at [facility], Nyoka Cowden, MD   1 year ago Acute non-recurrent maxillary sinusitis   [place] [facility] at [facility], Nyoka Cowden, MD       Future Appointments             In 1 month Judithann Graves, Nyoka Cowden, MD [facility] at [facility], [facility]

## 2022-12-06 ENCOUNTER — Telehealth: Payer: Self-pay | Admitting: Internal Medicine

## 2022-12-06 NOTE — Telephone Encounter (Signed)
Physical therapy referral has been placed for patient on 12/01/2022.

## 2022-12-06 NOTE — Telephone Encounter (Signed)
Copied from CRM [id number]. Topic: General - Other >> Dec 06, 2022  8:29 AM Clide Dales wrote: Patient would like to start physical therapy again with [facility]. [phone]

## 2022-12-07 ENCOUNTER — Ambulatory Visit: Payer: Federal, State, Local not specified - PPO

## 2022-12-13 ENCOUNTER — Ambulatory Visit (INDEPENDENT_AMBULATORY_CARE_PROVIDER_SITE_OTHER): Payer: Medicare Other | Admitting: Internal Medicine

## 2022-12-13 ENCOUNTER — Ambulatory Visit: Payer: Federal, State, Local not specified - PPO

## 2022-12-13 ENCOUNTER — Encounter: Payer: Self-pay | Admitting: Internal Medicine

## 2022-12-13 VITALS — BP 118/76 | HR 85 | Ht 69.0 in | Wt 207.0 lb

## 2022-12-13 DIAGNOSIS — Z23 Encounter for immunization: Secondary | ICD-10-CM | POA: Diagnosis not present

## 2022-12-13 DIAGNOSIS — L89151 Pressure ulcer of sacral region, stage 1: Secondary | ICD-10-CM | POA: Diagnosis not present

## 2022-12-13 DIAGNOSIS — E11311 Type 2 diabetes mellitus with unspecified diabetic retinopathy with macular edema: Secondary | ICD-10-CM

## 2022-12-13 DIAGNOSIS — E785 Hyperlipidemia, unspecified: Secondary | ICD-10-CM

## 2022-12-13 DIAGNOSIS — I130 Hypertensive heart and chronic kidney disease with heart failure and stage 1 through stage 4 chronic kidney disease, or unspecified chronic kidney disease: Secondary | ICD-10-CM | POA: Diagnosis not present

## 2022-12-13 DIAGNOSIS — I480 Paroxysmal atrial fibrillation: Secondary | ICD-10-CM | POA: Diagnosis not present

## 2022-12-13 DIAGNOSIS — E1169 Type 2 diabetes mellitus with other specified complication: Secondary | ICD-10-CM

## 2022-12-13 DIAGNOSIS — F39 Unspecified mood [affective] disorder: Secondary | ICD-10-CM

## 2022-12-13 MED ORDER — MIDODRINE HCL 5 MG PO TABS: 5.0000 mg | ORAL_TABLET | Freq: Three times a day (TID) | ORAL | 1 refills | Status: DC

## 2022-12-13 NOTE — Progress Notes (Signed)
Date:  12/13/2022   Name:  Cindy Osborne   DOB:  [birth year]   MRN:  [id number]   Chief Complaint: Anxiety and Recurrent Skin Infections (Boil is located at the top of her butt crack. Been thre for 3 weeks. Comes and goes. )  Anxiety Presents for follow-up visit. Symptoms include excessive worry, nervous/anxious behavior and palpitations. Patient reports no chest pain, dizziness or shortness of breath. Symptoms occur most days.    Skin lesions - several lesions on left side of her back and one at the top of her gluteal cleft.  Afib - had an episode yesterday; called EMS but did not transport.  She subsequently went to HD and did well.   Lab Results  Component Value Date   NA 137 06/03/2022   K 3.5 06/03/2022   CO2 26 06/03/2022   GLUCOSE 114 (H) 06/03/2022   BUN 32 (H) 06/03/2022   CREATININE 6.47 (H) 06/03/2022   CALCIUM 9.2 06/03/2022   GFRNONAA 7 (L) 06/03/2022   Lab Results  Component Value Date   CHOL 107 03/22/2022   HDL 35 (L) 03/22/2022   LDLCALC 55 03/22/2022   TRIG 84 03/22/2022   CHOLHDL 3.1 03/22/2022   Lab Results  Component Value Date   TSH 2.583 05/28/2022   Lab Results  Component Value Date   HGBA1C 4.9 06/01/2022   Lab Results  Component Value Date   WBC 7.3 06/03/2022   HGB 9.2 (L) 06/03/2022   HCT 29.4 (L) 06/03/2022   MCV 93.9 06/03/2022   PLT 168 06/03/2022   Lab Results  Component Value Date   ALT 25 06/16/2022   AST 19 06/16/2022   ALKPHOS 149 (H) 06/16/2022   BILITOT 1.3 (H) 06/16/2022   No results found for: "25OHVITD2", "25OHVITD3", "VD25OH"   Review of Systems  Constitutional:  Positive for diaphoresis. Negative for chills, fatigue, fever and unexpected weight change.  HENT:  Negative for trouble swallowing.   Respiratory:  Negative for chest tightness and shortness of breath.   Cardiovascular:  Positive for palpitations. Negative for chest pain.  Gastrointestinal:  Negative for abdominal pain, constipation and diarrhea.   Neurological:  Negative for dizziness, light-headedness and headaches.  Psychiatric/Behavioral:  Negative for dysphoric mood and sleep disturbance. The patient is nervous/anxious.     Patient Active Problem List   Diagnosis Date Noted   Type II diabetes mellitus with renal manifestations (HCC) 05/30/2022   HLD (hyperlipidemia) 05/30/2022   Obesity with body mass index (BMI) of 30.0 to 39.9 05/30/2022   Abnormal LFTs 05/30/2022   Chronic diastolic CHF (congestive heart failure) (HCC) 05/30/2022   Thyroid nodule 05/30/2022   Pleural effusion on right 05/30/2022   PAF (paroxysmal atrial fibrillation) (HCC) 05/30/2022   Intractable nausea and vomiting 05/27/2022   Weakness of left upper extremity 05/11/2022   History of stroke with current residual effects 05/08/2022   Mood disorder (HCC) 05/08/2022   Pressure injury of skin 04/05/2022   Acute right arterial ischemic stroke, MCA (middle cerebral artery) (HCC) 01/21/2022   Combined forms of age-related cataract of both eyes 06/30/2021   Diabetic macular edema (HCC) 06/30/2021   Encounter for insertion of mirena IUD 03/15/2021   Panic disorder 03/03/2021   Acquired absence of left leg below knee (HCC) 10/26/2020   PVD (peripheral vascular disease) (HCC) 07/28/2020   Uncontrolled type 2 diabetes mellitus with diabetic peripheral angiopathy without gangrene 02/26/2020   Acquired absence of right leg below knee (HCC) 07/28/2019   Diabetic retinopathy (  HCC) 11/23/2016   Slow transit constipation 09/24/2015   Environmental and seasonal allergies 07/09/2015   Anemia in ESRD (end-stage renal disease) (HCC) 07/09/2015   ESRD (end stage renal disease) on dialysis (HCC) 07/09/2015   Type II diabetes mellitus with complication (HCC) 07/09/2015   Herpes simplex infection 07/09/2015   Hyperlipidemia associated with type 2 diabetes mellitus (HCC) 07/09/2015   Heart & renal disease, hypertensive, with heart failure (HCC) 07/09/2015   Avitaminosis D  07/09/2015   Obstructive apnea 12/15/2014   Essential hypertension 09/11/2013    Allergies  Allergen Reactions   Augmentin [Amoxicillin-Pot Clavulanate] Shortness Of Breath   Atorvastatin Other (See Comments)    Back pain   Pioglitazone Other (See Comments)    Edema   Peanut-Containing Drug Products Itching   Rosuvastatin Itching and Other (See Comments)    Back pain   Shellfish-Derived Products Itching and Rash    shrimp   Sulfa Antibiotics Rash and Other (See Comments)    Shedding of skin    Past Surgical History:  Procedure Laterality Date   angioplasty politeal Left 07/2020   avg for dialysis     CHOLECYSTECTOMY     COLONOSCOPY  08/15/2011   cleared for 10 yrs   IR CT HEAD LTD  01/21/2022   IR CT HEAD LTD  03/21/2022   IR PERCUTANEOUS ART THROMBECTOMY/INFUSION INTRACRANIAL INC DIAG ANGIO  01/21/2022   IR PERCUTANEOUS ART THROMBECTOMY/INFUSION INTRACRANIAL INC DIAG ANGIO  03/21/2022   IR US GUIDE VASC ACCESS RIGHT  01/21/2022   IR US GUIDE VASC ACCESS RIGHT  03/21/2022   LEG AMPUTATION BELOW KNEE Right 04/2019   LEG AMPUTATION BELOW KNEE Left 09/08/2020   RADIOLOGY WITH ANESTHESIA N/A 01/21/2022   Procedure: IR WITH ANESTHESIA;  Surgeon: Radiologist, Medication, MD;  Location: [facility] OR;  Service: Radiology;  Laterality: N/A;   RADIOLOGY WITH ANESTHESIA N/A 03/21/2022   Procedure: IR WITH ANESTHESIA;  Surgeon: Radiologist, Medication, MD;  Location: [facility] OR;  Service: Radiology;  Laterality: N/A;   TOE AMPUTATION Right 10/15/2018    Social History   Tobacco Use   Smoking status: Never   Smokeless tobacco: Never  Vaping Use   Vaping Use: Never used  Substance Use Topics   Alcohol use: Yes   Drug use: Never     Medication list has been reviewed and updated.  Current Meds  Medication Sig   apixaban (ELIQUIS) 2.5 MG TABS tablet Take 1 tablet (2.5 mg total) by mouth 2 (two) times daily.   BD PEN NEEDLE NANO 2ND GEN 32G X 4 MM MISC    [DISCONTINUED] Insulin Glargine (BASAGLAR  KWIKPEN) 100 UNIT/ML Inject into the skin.   [DISCONTINUED] midodrine (PROAMATINE) 5 MG tablet Take 1 tablet (5 mg total) by mouth 3 (three) times daily with meals.       12/13/2022    3:03 PM 08/21/2022    9:51 AM 05/08/2022    9:50 AM 02/06/2022    2:07 PM  GAD 7 : Generalized Anxiety Score  Nervous, Anxious, on Edge 1 2 0 1  Control/stop worrying 3 1 0 0  Worry too much - different things 3 1 0 0  Trouble relaxing 1 1 0 0  Restless 0 1 0 0  Easily annoyed or irritable 3 1 0 0  Afraid - awful might happen 0 1 0 0  Total GAD 7 Score 11 8 0 1  Anxiety Difficulty Somewhat difficult Somewhat difficult Not difficult at all Not difficult at all  12/13/2022    3:02 PM 11/08/2022   10:22 AM 08/21/2022    9:51 AM  Depression screen PHQ 2/9  Decreased Interest 1 2 0  Down, Depressed, Hopeless 1 2 0  PHQ - 2 Score 2 4 0  Altered sleeping 0 0 0  Tired, decreased energy 0 0 0  Change in appetite 0 0 1  Feeling bad or failure about yourself  3 1 1   Trouble concentrating 0 1 1  Moving slowly or fidgety/restless 0 1 1  Suicidal thoughts 0 0 1  PHQ-9 Score 5 7 5   Difficult doing work/chores Somewhat difficult Somewhat difficult Somewhat difficult    BP Readings from Last 3 Encounters:  12/13/22 118/76  09/27/22 138/64  08/21/22 128/74    Physical Exam Constitutional:      Appearance: Normal appearance.  Cardiovascular:     Rate and Rhythm: Normal rate and regular rhythm.  Pulmonary:     Effort: Pulmonary effort is normal.     Breath sounds: No wheezing or rhonchi.  Musculoskeletal:     Cervical back: Normal range of motion and neck supple.  Lymphadenopathy:     Cervical: No cervical adenopathy.  Skin:         Comments: 1/2 cm shallow ulcer with white exudate Pencil eraser sized papule with white center - unroofed without pain/discharge/bleeding  Neurological:     Mental Status: She is alert.     Wt Readings from Last 3 Encounters:  12/13/22 207 lb (93.9 kg)   11/08/22 209 lb (94.8 kg)  09/27/22 209 lb (94.8 kg)    BP 118/76   Pulse 85   Ht 5\' 9"  (1.753 m)   Wt 207 lb (93.9 kg)   SpO2 95%   BMI 30.57 kg/m   Assessment and Plan:  Problem List Items Addressed This Visit       Cardiovascular and Mediastinum   PAF (paroxysmal atrial fibrillation) (HCC)    Having more episodes of Afib - mostly related to hot flashes On Elquis daily Recommend follow up with Cardiology if persistent Pt requests oxygen to use PRN - will not be covered by insurance      Relevant Medications   midodrine (PROAMATINE) 5 MG tablet   Heart & renal disease, hypertensive, with heart failure (HCC) - Primary (Chronic)   Relevant Medications   midodrine (PROAMATINE) 5 MG tablet     Endocrine   Diabetic macular edema (HCC)   Hyperlipidemia associated with type 2 diabetes mellitus (HCC) (Chronic)   Relevant Medications   midodrine (PROAMATINE) 5 MG tablet     Other   Mood disorder (HCC)    Stable symptoms not currently on treatment Anxiety appears to coincide with hot flashes and palpitations/afib      Pressure injury of skin    Very small shallow ulcer that should heal with local care and off loading whenever possible      Other Visit Diagnoses     Need for shingles vaccine       first dose today   Relevant Orders   Varicella-zoster vaccine IM (Completed)       No follow-ups on file.   Partially dictated using Dragon software, any errors are not intentional.  Reubin Milan, MD [facility] [place], [place]

## 2022-12-13 NOTE — Assessment & Plan Note (Signed)
Stable symptoms not currently on treatment Anxiety appears to coincide with hot flashes and palpitations/afib

## 2022-12-13 NOTE — Assessment & Plan Note (Addendum)
Having more episodes of Afib - mostly related to hot flashes On Elquis daily Recommend follow up with Cardiology if persistent Pt requests oxygen to use PRN - will not be covered by insurance

## 2022-12-13 NOTE — Assessment & Plan Note (Signed)
Very small shallow ulcer that should heal with local care and off loading whenever possible

## 2022-12-14 ENCOUNTER — Telehealth: Payer: Self-pay | Admitting: Cardiology

## 2022-12-14 NOTE — Telephone Encounter (Signed)
Spoke with patient and she stated that she did have an episode of rapid heart rate > 150 bpm so EMS was called. Once EMS arrived it had decreased to 102 bpm and she stated she felt better. Patient went on to say that she is currently going through menopause and that she often has hot flashes. She stated that these hot flashes cause her anxiety to increase which in turns causes her heart rate to increase. Patient stated that she is currently doing well and is aware of appointment on 12/18/2022 2:20 PM. Patient said that she could call this clinic if any questions or concerns arise between now and then. Patient educated to present to nearest ED or urgent care if any signs and symptoms of Afib present after hours. Patient understood with read back.

## 2022-12-14 NOTE — Telephone Encounter (Signed)
Patient c/o Palpitations:  High priority if patient c/o lightheadedness, shortness of breath, or chest pain  How long have you had palpitations/irregular HR/ Afib? Are you having the symptoms now?   No  Are you currently experiencing lightheadedness, SOB or CP?  No   Do you have a history of afib (atrial fibrillation) or irregular heart rhythm?  Yes  Have you checked your BP or HR? (document readings if available):   131/72, HR 79 (while on phone) 136/64, HR 70   Are you experiencing any other symptoms?    Patient stated she went on Tuesday she went into afib and had EMS come out.  Patient stated they could see she was in afib and her HR got up to 156 but came down to 102.  Patient stated she feels fine now and she is in dialysis today.  Patient has appointment scheduled on Monday, 5/6.

## 2022-12-16 ENCOUNTER — Other Ambulatory Visit: Payer: Self-pay

## 2022-12-16 ENCOUNTER — Emergency Department
Admission: EM | Admit: 2022-12-16 | Discharge: 2022-12-16 | Disposition: A | Discharge status: Home / Self Care | Payer: Medicare Other | Attending: Emergency Medicine | Admitting: Emergency Medicine

## 2022-12-16 DIAGNOSIS — I959 Hypotension, unspecified: Secondary | ICD-10-CM | POA: Diagnosis not present

## 2022-12-16 DIAGNOSIS — I4891 Unspecified atrial fibrillation: Secondary | ICD-10-CM | POA: Insufficient documentation

## 2022-12-16 DIAGNOSIS — R002 Palpitations: Secondary | ICD-10-CM | POA: Diagnosis present

## 2022-12-16 DIAGNOSIS — Z7901 Long term (current) use of anticoagulants: Secondary | ICD-10-CM | POA: Diagnosis not present

## 2022-12-16 LAB — CBC WITH DIFFERENTIAL/PLATELET
Abs Immature Granulocytes: 0.03 10*3/uL (ref 0.00–0.07)
Basophils Absolute: 0 10*3/uL (ref 0.0–0.1)
Basophils Relative: 1 %
Eosinophils Absolute: 0.2 10*3/uL (ref 0.0–0.5)
Eosinophils Relative: 2 %
HCT: 36.9 % (ref 36.0–46.0)
Hemoglobin: 11.6 g/dL — ABNORMAL LOW (ref 12.0–15.0)
Immature Granulocytes: 0 %
Lymphocytes Relative: 22 %
Lymphs Abs: 1.5 10*3/uL (ref 0.7–4.0)
MCH: 29 pg (ref 26.0–34.0)
MCHC: 31.4 g/dL (ref 30.0–36.0)
MCV: 92.3 fL (ref 80.0–100.0)
Monocytes Absolute: 0.5 10*3/uL (ref 0.1–1.0)
Monocytes Relative: 7 %
Neutro Abs: 4.6 10*3/uL (ref 1.7–7.7)
Neutrophils Relative %: 68 %
Platelets: 128 10*3/uL — ABNORMAL LOW (ref 150–400)
RBC: 4 MIL/uL (ref 3.87–5.11)
RDW: 13.2 % (ref 11.5–15.5)
WBC: 6.7 10*3/uL (ref 4.0–10.5)
nRBC: 0 % (ref 0.0–0.2)

## 2022-12-16 LAB — BASIC METABOLIC PANEL
Anion gap: 17 — ABNORMAL HIGH (ref 5–15)
BUN: 26 mg/dL — ABNORMAL HIGH (ref 6–20)
CO2: 29 mmol/L (ref 22–32)
Calcium: 9.4 mg/dL (ref 8.9–10.3)
Chloride: 92 mmol/L — ABNORMAL LOW (ref 98–111)
Creatinine, Ser: 4.04 mg/dL — ABNORMAL HIGH (ref 0.44–1.00)
GFR, Estimated: 12 mL/min — ABNORMAL LOW (ref >60)
Glucose, Bld: 132 mg/dL — ABNORMAL HIGH (ref 70–99)
Potassium: 2.7 mmol/L — CL (ref 3.5–5.1)
Sodium: 138 mmol/L (ref 135–145)

## 2022-12-16 LAB — TROPONIN I (HIGH SENSITIVITY): Troponin I (High Sensitivity): 509 ng/L (ref <18)

## 2022-12-16 MED ORDER — SODIUM CHLORIDE 0.9 % IV BOLUS
250.0000 mL | Freq: Once | INTRAVENOUS | Status: AC
  Administered 2022-12-16: 250 mL via INTRAVENOUS

## 2022-12-16 MED ORDER — AMIODARONE HCL 200 MG PO TABS: 200.0000 mg | ORAL_TABLET | Freq: Every day | ORAL | 2 refills | Status: DC

## 2022-12-16 MED ORDER — DILTIAZEM HCL 25 MG/5ML IV SOLN
10.0000 mg | Freq: Once | INTRAVENOUS | Status: AC
  Administered 2022-12-16: 10 mg via INTRAVENOUS
  Filled 2022-12-16: qty 5

## 2022-12-16 NOTE — ED Provider Notes (Signed)
[facility] Provider Note    Event Date/Time   First MD Initiated Contact with Patient 12/16/22 1707     (approximate)   History   Irregular Heart Beat   HPI  Cindy Osborne is a 57 y.o. female who presents to the emergency department today because of concerns for low blood pressure.  The patient says that she feels like she went into A-fib a couple of days ago.  She was at dialysis when she started feeling dizzy.  They checked her blood pressure and noted it to be low.  When EMS arrived the patient's blood pressure was low and she was in A-fib with RVR.  Given concern for unstable A-fib the patient was sedated with Versed and given an electric shock of 360 J without any change in rhythm.  She however was quite sedated with Versed and respiration was supported with BVM.   Physical Exam   Triage Vital Signs: ED Triage Vitals  Enc Vitals Group     BP 12/16/22 1705 (!) 85/74     Pulse Rate 12/16/22 1705 (!) 120     Resp 12/16/22 1705 (!) 28     Temp 12/16/22 1705 97.6 F (36.4 C)     Temp src --      SpO2 12/16/22 1705 100 %     Weight 12/16/22 1707 215 lb 2.7 oz (97.6 kg)     Height 12/16/22 1707 5\' 9"  (1.753 m)     Head Circumference --      Peak Flow --      Pain Score 12/16/22 1706 0     Pain Loc --      Pain Edu? --      Excl. in GC? --     Most recent vital signs: Vitals:   12/16/22 1705  BP: (!) 85/74  Pulse: (!) 120  Resp: (!) 28  Temp: 97.6 F (36.4 C)  SpO2: 100%   General: Somnolent, awakens to verbal stimuli. CV:  Good peripheral perfusion. Tachycardia, irregular rhythm. Resp:  Normal effort. Lungs clear. Abd:  No distention.  Other:  Bilateral lower extremity amputation.   ED Results / Procedures / Treatments   Labs (all labs ordered are listed, but only abnormal results are displayed) Labs Reviewed  CBC WITH DIFFERENTIAL/PLATELET - Abnormal; Notable for the following components:      Result Value   Hemoglobin 11.6  (*)    Platelets 128 (*)    All other components within normal limits  BASIC METABOLIC PANEL - Abnormal; Notable for the following components:   Potassium 2.7 (*)    Chloride 92 (*)    Glucose, Bld 132 (*)    BUN 26 (*)    Creatinine, Ser 4.04 (*)    GFR, Estimated 12 (*)    Anion gap 17 (*)    All other components within normal limits  TROPONIN I (HIGH SENSITIVITY) - Abnormal; Notable for the following components:   Troponin I (High Sensitivity) 509 (*)    All other components within normal limits  TROPONIN I (HIGH SENSITIVITY)     EKG  I, Phineas Semen, attending physician, personally viewed and interpreted this EKG  EKG Time: 1706 Rate: 122 Rhythm: atrial fibrillation Axis: left axis deviation Intervals: qtc 559 QRS: narrow, q waves v1 ST changes: LAFB Impression: no st elevation   RADIOLOGY None  PROCEDURES:  Critical Care performed: Yes  CRITICAL CARE Performed by: Phineas Semen   Total critical care time: 30 minutes  Critical care time was exclusive of separately billable procedures and treating other patients.  Critical care was necessary to treat or prevent imminent or life-threatening deterioration.  Critical care was time spent personally by me on the following activities: development of treatment plan with patient and/or surrogate as well as nursing, discussions with consultants, evaluation of patient's response to treatment, examination of patient, obtaining history from patient or surrogate, ordering and performing treatments and interventions, ordering and review of laboratory studies, ordering and review of radiographic studies, pulse oximetry and re-evaluation of patient's condition.   Procedures    MEDICATIONS ORDERED IN ED: Medications - No data to display   IMPRESSION / MDM / ASSESSMENT AND PLAN / ED COURSE  I reviewed the triage vital signs and the nursing notes.                              Differential diagnosis includes, but  is not limited to, afib with rvr, dehydration, infection  Patient's presentation is most consistent with acute presentation with potential threat to life or bodily function.   The patient is on the cardiac monitor to evaluate for evidence of arrhythmia and/or significant heart rate changes.  Patient presented to the emergency department via EMS as emergency traffic after being found to have low blood pressure from dialysis.  Electrical cardioversion was attempted by EMS however patient remained in atrial fibrillation on arrival here in the emergency department.  She was awake and alert.  Initial blood pressure here was low.  Patient was given small fluid bolus.  Blood pressure did not improve enough that I felt comfortable giving patient dose of diltiazem.  Perhaps 30 minutes after the diltiazem and the patient converted to sinus rhythm.  Her blood pressure did improve.  Patient stated that symptomatically she felt significantly improved.  Blood work was notable for elevated troponin.  I discussed with Dr. Azucena Cecil her cardiologist.  At this time felt less likely ischemic elevation perhaps more likely related to the A-fib with RVR and electric shock.  Generally discussed with Dr. Maurine Cane with nephrology given low potassium.  Did not recommend any potassium repletion at this time.  Cardiology did recommend starting amiodarone 200 mg daily.  I discussed this with the patient.  She felt comfortable with discharge home and I think this is reasonable at this time.      FINAL CLINICAL IMPRESSION(S) / ED DIAGNOSES   Final diagnoses:  Atrial fibrillation with RVR (HCC)  Hypotension, unspecified hypotension type     Note:  This document was prepared using Dragon voice recognition software and may include unintentional dictation errors.    Phineas Semen, MD 12/16/22 2043

## 2022-12-16 NOTE — ED Notes (Signed)
Pt given lunch box and assisted in opening food up.  RN also assisted her in washing her hands.  Family has gone home to get Zenaida Niece to transport her home.  Should arrive in 45 minutes.  CN aware.

## 2022-12-16 NOTE — ED Notes (Signed)
Pt's family members at the bedside.

## 2022-12-16 NOTE — Discharge Instructions (Addendum)
Please seek medical attention for any high fevers, chest pain, shortness of breath, change in behavior, persistent vomiting, bloody stool or any other new or concerning symptoms.  

## 2022-12-16 NOTE — ED Notes (Signed)
Family returned, discharge instructions again reviewed.

## 2022-12-16 NOTE — ED Triage Notes (Signed)
Pt comes in via [facility] from dialysis after her blood pressure dropped to 70/40, and she began feeling palpitations. When EMS arrived, the patient was in AFIB with a pulse of 170. Pt received one round of cardioversion at 360. Pt also received 5 of versed with EMS. When patient arrived at the hospital, she was being manually bagged by ems. Pt is alert and oriented x4, but is a but lethargic at this time. Pt is able to answer all of the questions asked. Pt has a history of Afib, and is taking eliquis.

## 2022-12-18 ENCOUNTER — Encounter: Payer: Self-pay | Admitting: Cardiology

## 2022-12-18 ENCOUNTER — Ambulatory Visit: Payer: Medicare Other | Attending: Cardiology | Admitting: Cardiology

## 2022-12-18 VITALS — BP 122/72 | HR 83 | Ht 69.0 in | Wt 207.0 lb

## 2022-12-18 DIAGNOSIS — I5032 Chronic diastolic (congestive) heart failure: Secondary | ICD-10-CM | POA: Diagnosis present

## 2022-12-18 DIAGNOSIS — I480 Paroxysmal atrial fibrillation: Secondary | ICD-10-CM | POA: Diagnosis not present

## 2022-12-18 MED ORDER — METOPROLOL SUCCINATE ER 25 MG PO TB24: 25.0000 mg | ORAL_TABLET | Freq: Every day | ORAL | 0 refills | Status: DC

## 2022-12-18 MED ORDER — MIDODRINE HCL 10 MG PO TABS
10.0000 mg | ORAL_TABLET | Freq: Three times a day (TID) | ORAL | 0 refills | Status: DC
Start: 2022-12-18 — End: 2023-01-05

## 2022-12-18 NOTE — Patient Instructions (Signed)
Medication Instructions:   INCREASE Midodrine - take one tablet ( 10mg ) by mouth three times a day.  - If you have plenty of the 5 mg tablets you are more than welcome to double up on those.   2. START Metoprolol Succinate - take one tablet ( 25mg ) by mouth daily.   *If you need a refill on your cardiac medications before your next appointment, please call your pharmacy*   Lab Work:  None Ordered  If you have labs (blood work) drawn today and your tests are completely normal, you will receive your results only by: MyChart Message (if you have MyChart) OR A paper copy in the mail If you have any lab test that is abnormal or we need to change your treatment, we will call you to review the results.   Testing/Procedures:  None Ordered   Follow-Up: At [facility], you and your health needs are our priority.  As part of our continuing mission to provide you with exceptional heart care, we have created designated Provider Care Teams.  These Care Teams include your primary Cardiologist (physician) and Advanced Practice Providers (APPs -  Physician Assistants and Nurse Practitioners) who all work together to provide you with the care you need, when you need it.  We recommend signing up for the patient portal called "MyChart".  Sign up information is provided on this After Visit Summary.  MyChart is used to connect with patients for Virtual Visits (Telemedicine).  Patients are able to view lab/test results, encounter notes, upcoming appointments, etc.  Non-urgent messages can be sent to your provider as well.   To learn more about what you can do with MyChart, go to [email or web address].    Your next appointment:   5 month(s)  Provider:   You may see Debbe Odea, MD or one of the following Advanced Practice Providers on your designated Care Team:   Nicolasa Ducking, NP Eula Listen, PA-C Cadence Fransico Michael, PA-C Charlsie Quest, NP

## 2022-12-18 NOTE — Progress Notes (Signed)
Cardiology Office Note:    Date:  12/18/2022   ID:  Cindy Osborne, DOB [birth year], MRN [id number]  PCP:  Reubin Milan, MD   [facility] Providers Cardiologist:  Debbe Odea, MD     Referring MD: Reubin Milan, MD   Chief Complaint  Patient presents with   Follow-up    Went to ED on 12/16/22 due to tachycardic episode at dialysis.  Not taking Amiodarone as prescribed at ED.  When patient was home last Tuesday she was having a hot flash with vomiting and called EMS for evaluation.  Not advised to go to ED    History of Present Illness:    Cindy Osborne is a 57 y.o. female with a hx of paroxysmal atrial fibrillation, hypertension, hyperlipidemia, diabetes, CVA x 2 (6/23, 03/2022), PAD s/p bilateral BKA, end-stage renal disease on HD TTS who presents for ED follow-up due to palpitations, atrial fibrillation.  Presented to the ED 2 days history of due to symptoms of dizziness and palpitations after dialysis 2 days ago.  Blood pressures were noted to be low with systolics in the 90s.  She was given some fluids, but dizziness persisted.  EMS was called, was noted to be tachycardic upon presentation, also became hypotensive.  She was cardioverted unsuccessfully in the field per EMS.  Eventually taken to the ED.    In the ED initial BP was 85/74, heart rate 120, EKG showed A-fib RVR heart rate 122.  Given IV Cardizem 10 mg with spontaneous conversion couple of minutes later.  BP normalized when patient achieves sinus rhythm.  She felt well, discharge.  Prior notes/studies Echo 01/2022 EF 55 to 60%, grade 2 diastolic dysfunction. History of hypotension during dialysis sessions, history of bleeding with Eliquis 5 mg twice daily, no further bleeding since taking Eliquis 2.5 mg twice daily.  Past Medical History:  Diagnosis Date   Acute ischemic right MCA stroke (HCC) 03/25/2022   Amputated toe (HCC) 01/31/2012   Right second toe distal phalanx Right great toe    Diabetes mellitus without complication (HCC)    History of diabetic ulcer of foot 04/01/2014   Hyperlipidemia    Hypertension    Stroke (cerebrum) (HCC) 03/21/2022   Stroke (HCC) 01/21/2022    Past Surgical History:  Procedure Laterality Date   angioplasty politeal Left 07/2020   avg for dialysis     CHOLECYSTECTOMY     COLONOSCOPY  08/15/2011   cleared for 10 yrs   IR CT HEAD LTD  01/21/2022   IR CT HEAD LTD  03/21/2022   IR PERCUTANEOUS ART THROMBECTOMY/INFUSION INTRACRANIAL INC DIAG ANGIO  01/21/2022   IR PERCUTANEOUS ART THROMBECTOMY/INFUSION INTRACRANIAL INC DIAG ANGIO  03/21/2022   IR US GUIDE VASC ACCESS RIGHT  01/21/2022   IR US GUIDE VASC ACCESS RIGHT  03/21/2022   LEG AMPUTATION BELOW KNEE Right 04/2019   LEG AMPUTATION BELOW KNEE Left 09/08/2020   RADIOLOGY WITH ANESTHESIA N/A 01/21/2022   Procedure: IR WITH ANESTHESIA;  Surgeon: Radiologist, Medication, MD;  Location: [facility];  Service: Radiology;  Laterality: N/A;   RADIOLOGY WITH ANESTHESIA N/A 03/21/2022   Procedure: IR WITH ANESTHESIA;  Surgeon: Radiologist, Medication, MD;  Location: [facility] OR;  Service: Radiology;  Laterality: N/A;   TOE AMPUTATION Right 10/15/2018    Current Medications: Current Meds  Medication Sig   apixaban (ELIQUIS) 2.5 MG TABS tablet Take 1 tablet (2.5 mg total) by mouth 2 (two) times daily.   hydrOXYzine (ATARAX) 25 MG tablet Take  25 mg by mouth 3 (three) times daily as needed for anxiety.   metoprolol succinate (TOPROL XL) 25 MG 24 hr tablet Take 1 tablet (25 mg total) by mouth daily.   [DISCONTINUED] midodrine (PROAMATINE) 5 MG tablet Take 1 tablet (5 mg total) by mouth 3 (three) times daily with meals.     Allergies:   Augmentin [amoxicillin-pot clavulanate], Atorvastatin, Pioglitazone, Peanut-containing drug products, Rosuvastatin, Shellfish-derived products, and Sulfa antibiotics   Social History   Socioeconomic History   Marital status: Divorced    Spouse name: Not on file   Number of  children: Not on file   Years of education: Not on file   Highest education level: Not on file  Occupational History   Not on file  Tobacco Use   Smoking status: Never   Smokeless tobacco: Never  Vaping Use   Vaping Use: Never used  Substance and Sexual Activity   Alcohol use: Yes   Drug use: Never   Sexual activity: Yes    Partners: Male  Other Topics Concern   Not on file  Social History Narrative   Not on file   Social Determinants of Health   Financial Resource Strain: Low Risk  (11/08/2022)   Overall Financial Resource Strain (CARDIA)    Difficulty of Paying Living Expenses: Not hard at all  Food Insecurity: No Food Insecurity (11/08/2022)   Hunger Vital Sign    Worried About Running Out of Food in the Last Year: Never true    Ran Out of Food in the Last Year: Never true  Transportation Needs: No Transportation Needs (05/31/2022)   PRAPARE - Administrator, Civil Service (Medical): No    Lack of Transportation (Non-Medical): No  Physical Activity: Inactive (11/08/2022)   Exercise Vital Sign    Days of Exercise per Week: 0 days    Minutes of Exercise per Session: 0 min  Stress: No Stress Concern Present (11/08/2022)   [employer] of Occupational Health - Occupational Stress Questionnaire    Feeling of Stress : Only a little  Social Connections: Unknown (11/08/2022)   Social Connection and Isolation Panel [NHANES]    Frequency of Communication with Friends and Family: Patient declined    Frequency of Social Gatherings with Friends and Family: Patient declined    Attends Religious Services: Patient declined    Database administrator or Organizations: Not on file    Attends Banker Meetings: Patient declined    Marital Status: Patient declined     Family History: The patient's family history includes Diabetes in her mother and sister.  ROS:   Please see the history of present illness.     All other systems reviewed and are  negative.  EKGs/Labs/Other Studies Reviewed:    The following studies were reviewed today:   EKG:  EKG is  ordered today.  The ekg ordered today demonstrates normal sinus rhythm, heart rate 83  Recent Labs: 05/28/2022: B Natriuretic Peptide 1,942.0; TSH 2.583 05/31/2022: Magnesium 1.9 06/16/2022: ALT 25 12/16/2022: BUN 26; Creatinine, Ser 4.04; Hemoglobin 11.6; Platelets 128; Potassium 2.7; Sodium 138  Recent Lipid Panel    Component Value Date/Time   CHOL 107 03/22/2022 0243   CHOL 168 03/03/2021 0858   TRIG 84 03/22/2022 0243   HDL 35 (L) 03/22/2022 0243   HDL 35 (L) 03/03/2021 0858   CHOLHDL 3.1 03/22/2022 0243   VLDL 17 03/22/2022 0243   LDLCALC 55 03/22/2022 0243   LDLCALC 113 (H) 03/03/2021 9604  Risk Assessment/Calculations:             Physical Exam:    VS:  BP 122/72 (BP Location: Left Arm, Patient Position: Sitting, Cuff Size: Normal)   Pulse 83   Ht 5\' 9"  (1.753 m)   Wt 207 lb (93.9 kg)   SpO2 98%   BMI 30.57 kg/m     Wt Readings from Last 3 Encounters:  12/18/22 207 lb (93.9 kg)  12/16/22 215 lb 2.7 oz (97.6 kg)  12/13/22 207 lb (93.9 kg)     GEN:  Well nourished, well developed in no acute distress HEENT: Normal NECK: No JVD; No carotid bruits CARDIAC: RRR, no murmurs RESPIRATORY: Diminished breath sounds, otherwise clear ABDOMEN: Soft, non-tender, non-distended MUSCULOSKELETAL:   bilateral BKA noted SKIN: Warm and dry NEUROLOGIC:  Alert and oriented x 3, left arm weakness PSYCHIATRIC:  Normal affect   ASSESSMENT:    1. Paroxysmal atrial fibrillation (HCC)   2. Chronic heart failure with preserved ejection fraction (HCC)    PLAN:    In order of problems listed above:  Paroxysmal atrial fibrillation, history of CVA x 2.  Continue Eliquis.  Very symptomatic when in A-fib.  History of hypotension especially during dialysis and went tachycardic.  Increase midodrine to 10 mg 3 times daily.  Start Toprol-XL 25 mg daily.  Refer to EP clinic  for antiarrhythmic consideration. HFpEF, appears euvolemic, volume control with dialysis.  Follow-up in 5 months.     Medication Adjustments/Labs and Tests Ordered: Current medicines are reviewed at length with the patient today.  Concerns regarding medicines are outlined above.  Orders Placed This Encounter  Procedures   Ambulatory referral to Cardiac Electrophysiology   EKG 12-Lead   Meds ordered this encounter  Medications   midodrine (PROAMATINE) 10 MG tablet    Sig: Take 1 tablet (10 mg total) by mouth 3 (three) times daily with meals.    Dispense:  90 tablet    Refill:  0   metoprolol succinate (TOPROL XL) 25 MG 24 hr tablet    Sig: Take 1 tablet (25 mg total) by mouth daily.    Dispense:  90 tablet    Refill:  0    Patient Instructions  Medication Instructions:   INCREASE Midodrine - take one tablet ( 10mg ) by mouth three times a day.  - If you have plenty of the 5 mg tablets you are more than welcome to double up on those.   2. START Metoprolol Succinate - take one tablet ( 25mg ) by mouth daily.   *If you need a refill on your cardiac medications before your next appointment, please call your pharmacy*   Lab Work:  None Ordered  If you have labs (blood work) drawn today and your tests are completely normal, you will receive your results only by: MyChart Message (if you have MyChart) OR A paper copy in the mail If you have any lab test that is abnormal or we need to change your treatment, we will call you to review the results.   Testing/Procedures:  None Ordered   Follow-Up: At [facility], you and your health needs are our priority.  As part of our continuing mission to provide you with exceptional heart care, we have created designated Provider Care Teams.  These Care Teams include your primary Cardiologist (physician) and Advanced Practice Providers (APPs -  Physician Assistants and Nurse Practitioners) who all work together to provide you with  the care you need, when you need  it.  We recommend signing up for the patient portal called "MyChart".  Sign up information is provided on this After Visit Summary.  MyChart is used to connect with patients for Virtual Visits (Telemedicine).  Patients are able to view lab/test results, encounter notes, upcoming appointments, etc.  Non-urgent messages can be sent to your provider as well.   To learn more about what you can do with MyChart, go to [email or web address].    Your next appointment:   5 month(s)  Provider:   You may see Debbe Odea, MD or one of the following Advanced Practice Providers on your designated Care Team:   Nicolasa Ducking, NP Eula Listen, PA-C Cadence Fransico Michael, PA-C Charlsie Quest, NP   Signed, Debbe Odea, MD  12/18/2022 3:25 PM    [facility]

## 2022-12-25 ENCOUNTER — Ambulatory Visit: Payer: Medicare Other | Admitting: Physical Therapy

## 2022-12-27 ENCOUNTER — Ambulatory Visit: Payer: Self-pay

## 2022-12-27 ENCOUNTER — Other Ambulatory Visit: Payer: Self-pay | Admitting: Internal Medicine

## 2022-12-27 ENCOUNTER — Telehealth: Payer: Self-pay | Admitting: Internal Medicine

## 2022-12-27 ENCOUNTER — Encounter: Payer: Self-pay | Admitting: Internal Medicine

## 2022-12-27 ENCOUNTER — Encounter: Payer: Medicare Other | Admitting: Physical Therapy

## 2022-12-27 ENCOUNTER — Ambulatory Visit: Payer: Federal, State, Local not specified - PPO

## 2022-12-27 ENCOUNTER — Ambulatory Visit
Admission: RE | Admit: 2022-12-27 | Discharge: 2022-12-27 | Disposition: A | Discharge status: Home / Self Care | Payer: Medicare Other | Attending: Internal Medicine | Admitting: Internal Medicine

## 2022-12-27 ENCOUNTER — Ambulatory Visit (INDEPENDENT_AMBULATORY_CARE_PROVIDER_SITE_OTHER): Payer: Medicare Other | Admitting: Internal Medicine

## 2022-12-27 ENCOUNTER — Ambulatory Visit
Admission: RE | Admit: 2022-12-27 | Discharge: 2022-12-27 | Disposition: A | Discharge status: Home / Self Care | Payer: Medicare Other | Source: Ambulatory Visit | Attending: Internal Medicine | Admitting: Internal Medicine

## 2022-12-27 VITALS — BP 130/80 | HR 89 | Ht 69.0 in | Wt 210.0 lb

## 2022-12-27 DIAGNOSIS — R0602 Shortness of breath: Secondary | ICD-10-CM

## 2022-12-27 DIAGNOSIS — F419 Anxiety disorder, unspecified: Secondary | ICD-10-CM

## 2022-12-27 MED ORDER — LEVOFLOXACIN 250 MG PO TABS
ORAL_TABLET | ORAL | 0 refills | Status: DC
Start: 2022-12-27 — End: 2022-12-27

## 2022-12-27 NOTE — Telephone Encounter (Signed)
Medication Refill - Medication: levofloxacin (LEVAQUIN) 250 MG tablet [Pharmacy Med Name: LEVOFLOXACIN 250 MG TABLET]  Has the patient contacted their pharmacy? Yes.   Pharmacy stated they sent a form stating the script was written incorrectly for the dosage and need it sent back to them in order for them to fill it  Preferred Pharmacy (with phone number or street name):  [id number] - [place], [place] - [address] Phone: [phone]  Fax: [phone]     Has the patient been seen for an appointment in the last year OR does the patient have an upcoming appointment? No.  Agent: Please be advised that RX refills may take up to 3 business days. We ask that you follow-up with your pharmacy.   Patient does not have transportation and her aide will need to pick it up but she leaves at  2pm

## 2022-12-27 NOTE — Patient Instructions (Signed)
Take Levofloxacin 2 tabs today then one tab on Friday, Sunday and Tuesday.

## 2022-12-27 NOTE — Telephone Encounter (Signed)
Called and spoke with pharmacy - Informed sig is supposed to be 2 tabs on day 1, then 1 tab on day 3, 5, and 7. She took verbal sig and will fill Rx for patient.  - Rhyatt Muska

## 2022-12-27 NOTE — Progress Notes (Signed)
Date:  12/27/2022   Name:  Cindy Osborne   DOB:  [birth year]   MRN:  [id number]   Chief Complaint: No chief complaint on file.  Shortness of Breath This is a new problem. The current episode started in the past 7 days. The problem occurs daily. The problem has been unchanged. Pertinent negatives include no chest pain, fever, headaches, sputum production or wheezing.  She gets slightly shortness of breath when talking but recovers immediately.  She has more problems when she does ADLs.  Her oxygen can drop to 84% but recovers quickly. She denies cough but feels very fatigued.  HD is going well, she is maintaining her dry weight well in between visits. She was seen recently by Cardiology.  Lab Results  Component Value Date   NA 138 12/16/2022   K 2.7 (LL) 12/16/2022   CO2 29 12/16/2022   GLUCOSE 132 (H) 12/16/2022   BUN 26 (H) 12/16/2022   CREATININE 4.04 (H) 12/16/2022   CALCIUM 9.4 12/16/2022   GFRNONAA 12 (L) 12/16/2022   Lab Results  Component Value Date   CHOL 107 03/22/2022   HDL 35 (L) 03/22/2022   LDLCALC 55 03/22/2022   TRIG 84 03/22/2022   CHOLHDL 3.1 03/22/2022   Lab Results  Component Value Date   TSH 2.583 05/28/2022   Lab Results  Component Value Date   HGBA1C 4.9 06/01/2022   Lab Results  Component Value Date   WBC 6.7 12/16/2022   HGB 11.6 (L) 12/16/2022   HCT 36.9 12/16/2022   MCV 92.3 12/16/2022   PLT 128 (L) 12/16/2022   Lab Results  Component Value Date   ALT 25 06/16/2022   AST 19 06/16/2022   ALKPHOS 149 (H) 06/16/2022   BILITOT 1.3 (H) 06/16/2022   No results found for: "25OHVITD2", "25OHVITD3", "VD25OH"   Review of Systems  Constitutional:  Positive for fatigue. Negative for chills, diaphoresis and fever.  HENT:  Negative for trouble swallowing.   Respiratory:  Positive for shortness of breath. Negative for cough, sputum production, chest tightness and wheezing.   Cardiovascular:  Negative for chest pain.  Gastrointestinal:   Negative for constipation and diarrhea.  Neurological:  Positive for light-headedness. Negative for dizziness and headaches.  Psychiatric/Behavioral:  Negative for dysphoric mood and sleep disturbance. The patient is nervous/anxious.     Patient Active Problem List   Diagnosis Date Noted   Type II diabetes mellitus with renal manifestations (HCC) 05/30/2022   HLD (hyperlipidemia) 05/30/2022   Obesity with body mass index (BMI) of 30.0 to 39.9 05/30/2022   Abnormal LFTs 05/30/2022   Chronic diastolic CHF (congestive heart failure) (HCC) 05/30/2022   Thyroid nodule 05/30/2022   Pleural effusion on right 05/30/2022   PAF (paroxysmal atrial fibrillation) (HCC) 05/30/2022   Intractable nausea and vomiting 05/27/2022   Weakness of left upper extremity 05/11/2022   History of stroke with current residual effects 05/08/2022   Mood disorder (HCC) 05/08/2022   Pressure injury of skin 04/05/2022   Acute right arterial ischemic stroke, MCA (middle cerebral artery) (HCC) 01/21/2022   Combined forms of age-related cataract of both eyes 06/30/2021   Diabetic macular edema (HCC) 06/30/2021   Encounter for insertion of mirena IUD 03/15/2021   Panic disorder 03/03/2021   Acquired absence of left leg below knee (HCC) 10/26/2020   PVD (peripheral vascular disease) (HCC) 07/28/2020   Uncontrolled type 2 diabetes mellitus with diabetic peripheral angiopathy without gangrene 02/26/2020   Acquired absence of right leg below knee (  HCC) 07/28/2019   Diabetic retinopathy (HCC) 11/23/2016   Slow transit constipation 09/24/2015   Environmental and seasonal allergies 07/09/2015   Anemia in ESRD (end-stage renal disease) (HCC) 07/09/2015   ESRD (end stage renal disease) on dialysis (HCC) 07/09/2015   Type II diabetes mellitus with complication (HCC) 07/09/2015   Herpes simplex infection 07/09/2015   Hyperlipidemia associated with type 2 diabetes mellitus (HCC) 07/09/2015   Heart & renal disease, hypertensive,  with heart failure (HCC) 07/09/2015   Avitaminosis D 07/09/2015   Obstructive apnea 12/15/2014   Essential hypertension 09/11/2013    Allergies  Allergen Reactions   Augmentin [Amoxicillin-Pot Clavulanate] Shortness Of Breath   Atorvastatin Other (See Comments)    Back pain   Pioglitazone Other (See Comments)    Edema   Peanut-Containing Drug Products Itching   Rosuvastatin Itching and Other (See Comments)    Back pain   Shellfish-Derived Products Itching and Rash    shrimp   Sulfa Antibiotics Rash and Other (See Comments)    Shedding of skin    Past Surgical History:  Procedure Laterality Date   angioplasty politeal Left 07/2020   avg for dialysis     CHOLECYSTECTOMY     COLONOSCOPY  08/15/2011   cleared for 10 yrs   IR CT HEAD LTD  01/21/2022   IR CT HEAD LTD  03/21/2022   IR PERCUTANEOUS ART THROMBECTOMY/INFUSION INTRACRANIAL INC DIAG ANGIO  01/21/2022   IR PERCUTANEOUS ART THROMBECTOMY/INFUSION INTRACRANIAL INC DIAG ANGIO  03/21/2022   IR US GUIDE VASC ACCESS RIGHT  01/21/2022   IR US GUIDE VASC ACCESS RIGHT  03/21/2022   LEG AMPUTATION BELOW KNEE Right 04/2019   LEG AMPUTATION BELOW KNEE Left 09/08/2020   RADIOLOGY WITH ANESTHESIA N/A 01/21/2022   Procedure: IR WITH ANESTHESIA;  Surgeon: Radiologist, Medication, MD;  Location: [facility];  Service: Radiology;  Laterality: N/A;   RADIOLOGY WITH ANESTHESIA N/A 03/21/2022   Procedure: IR WITH ANESTHESIA;  Surgeon: Radiologist, Medication, MD;  Location: [facility] OR;  Service: Radiology;  Laterality: N/A;   TOE AMPUTATION Right 10/15/2018    Social History   Tobacco Use   Smoking status: Never   Smokeless tobacco: Never  Vaping Use   Vaping Use: Never used  Substance Use Topics   Alcohol use: Yes   Drug use: Never     Medication list has been reviewed and updated.  Current Meds  Medication Sig   apixaban (ELIQUIS) 2.5 MG TABS tablet Take 1 tablet (2.5 mg total) by mouth 2 (two) times daily.   hydrOXYzine (ATARAX) 25 MG tablet  Take 25 mg by mouth 3 (three) times daily as needed for anxiety.   levofloxacin (LEVAQUIN) 250 MG tablet 5 tabs on day 1 then 1 tab on days 3, 5 and 7.   metoprolol succinate (TOPROL XL) 25 MG 24 hr tablet Take 1 tablet (25 mg total) by mouth daily.   midodrine (PROAMATINE) 10 MG tablet Take 1 tablet (10 mg total) by mouth 3 (three) times daily with meals.       12/27/2022   11:18 AM 12/13/2022    3:03 PM 08/21/2022    9:51 AM 05/08/2022    9:50 AM  GAD 7 : Generalized Anxiety Score  Nervous, Anxious, on Edge 2 1 2  0  Control/stop worrying 0 3 1 0  Worry too much - different things 0 3 1 0  Trouble relaxing 1 1 1  0  Restless 2 0 1 0  Easily annoyed or irritable 2 3  1 0  Afraid - awful might happen 0 0 1 0  Total GAD 7 Score 7 11 8  0  Anxiety Difficulty Not difficult at all Somewhat difficult Somewhat difficult Not difficult at all       12/27/2022   11:18 AM 12/13/2022    3:02 PM 11/08/2022   10:22 AM  Depression screen PHQ 2/9  Decreased Interest 2 1 2   Down, Depressed, Hopeless 0 1 2  PHQ - 2 Score 2 2 4   Altered sleeping 0 0 0  Tired, decreased energy 3 0 0  Change in appetite 2 0 0  Feeling bad or failure about yourself  0 3 1  Trouble concentrating 0 0 1  Moving slowly or fidgety/restless 0 0 1  Suicidal thoughts 0 0 0  PHQ-9 Score 7 5 7   Difficult doing work/chores Somewhat difficult Somewhat difficult Somewhat difficult    BP Readings from Last 3 Encounters:  12/27/22 130/80  12/18/22 122/72  12/16/22 125/63    Physical Exam Constitutional:      Appearance: She is ill-appearing.  Cardiovascular:     Rate and Rhythm: Normal rate and regular rhythm.     Pulses: Normal pulses.     Heart sounds: No murmur heard. Pulmonary:     Effort: Pulmonary effort is normal.     Breath sounds: No wheezing or rhonchi.  Musculoskeletal:     Cervical back: Normal range of motion.  Lymphadenopathy:     Cervical: No cervical adenopathy.  Skin:    General: Skin is warm and dry.   Neurological:     Mental Status: She is alert.  Psychiatric:        Mood and Affect: Mood normal.     Wt Readings from Last 3 Encounters:  12/27/22 210 lb (95.3 kg)  12/18/22 207 lb (93.9 kg)  12/16/22 215 lb 2.7 oz (97.6 kg)    BP 130/80   Pulse 89   Ht 5\' 9"  (1.753 m)   Wt 210 lb (95.3 kg)   SpO2 (!) 88%   BMI 31.01 kg/m   Assessment and Plan:  Problem List Items Addressed This Visit   None Visit Diagnoses     SOB (shortness of breath) on exertion    -  Primary   with transient drops in oxygen. will get CXR to evaluate and begin treatment for atypical pulmonary infection   Relevant Medications   levofloxacin (LEVAQUIN) 250 MG tablet   Other Relevant Orders   DG Chest 2 View       No follow-ups on file.   Partially dictated using Dragon software, any errors are not intentional.  Reubin Milan, MD [facility] [place], [place]

## 2022-12-27 NOTE — Telephone Encounter (Signed)
  Chief Complaint: SOB Symptoms: SOB with sitting and walking- pt is able to talk in full sentences and not SOB during call- "feels like I need to take deep breaths" Frequency: 1 week Pertinent Negatives: Patient denies fever, chest pain , back pain, cold sx Disposition: [] ED /[] Urgent Care (no appt availability in office) / [x] Appointment(In office/virtual)/ []  [facility]/ [] Home Care/ [] Refused Recommended Disposition /[] [facility]/ []  Follow-up with PCP Additional Notes: appt today with PCP Reason for Disposition  [1] MILD difficulty breathing (e.g., minimal/no SOB at rest, SOB with walking, pulse <100) AND [2] NEW-onset or WORSE than normal  Answer Assessment - Initial Assessment Questions 1. RESPIRATORY STATUS: "Describe your breathing?" (e.g., wheezing, shortness of breath, unable to speak, severe coughing)      SOB 2. ONSET: "When did this breathing problem begin?"      1 week  3. PATTERN "Does the difficult breathing come and go, or has it been constant since it started?"      Comes and goes  worse worse 4. SEVERITY: "How bad is your breathing?" (e.g., mild, moderate, severe)    - MILD: No SOB at rest, mild SOB with walking, speaks normally in sentences, can lie down, no retractions, pulse < 100.    - MODERATE: SOB at rest, SOB with minimal exertion and prefers to sit, cannot lie down flat, speaks in phrases, mild retractions, audible wheezing, pulse 100-120.    - SEVERE: Very SOB at rest, speaks in single words, struggling to breathe, sitting hunched forward, retractions, pulse > 120      moderate 5. RECURRENT SYMPTOM: "Have you had difficulty breathing before?" If Yes, ask: "When was the last time?" and "What happened that time?"      N/a 6. CARDIAC HISTORY: "Do you have any history of heart disease?" (e.g., heart attack, angina, bypass surgery, angioplasty)      N/a 7. LUNG HISTORY: "Do you have any history of lung disease?"  (e.g., pulmonary embolus,  asthma, emphysema)     no 8. CAUSE: "What do you think is causing the breathing problem?"      pna 9. OTHER SYMPTOMS: "Do you have any other symptoms? (e.g., dizziness, runny nose, cough, chest pain, fever)     no 10. O2 SATURATION MONITOR:  "Do you use an oxygen saturation monitor (pulse oximeter) at home?" If Yes, ask: "What is your reading (oxygen level) today?" "What is your usual oxygen saturation reading?" (e.g., 95%)       N/a 11. PREGNANCY: "Is there any chance you are pregnant?" "When was your last menstrual period?"       N/a 12. TRAVEL: "Have you traveled out of the country in the last month?" (e.g., travel history, exposures)       N/a  Protocols used: Breathing Difficulty-A-AH

## 2022-12-28 ENCOUNTER — Emergency Department: Payer: Medicare Other

## 2022-12-28 ENCOUNTER — Emergency Department
Admission: EM | Admit: 2022-12-28 | Discharge: 2022-12-28 | Disposition: A | Discharge status: Home / Self Care | Payer: Medicare Other | Attending: Emergency Medicine | Admitting: Emergency Medicine

## 2022-12-28 ENCOUNTER — Other Ambulatory Visit: Payer: Self-pay

## 2022-12-28 DIAGNOSIS — Z8673 Personal history of transient ischemic attack (TIA), and cerebral infarction without residual deficits: Secondary | ICD-10-CM | POA: Insufficient documentation

## 2022-12-28 DIAGNOSIS — E1122 Type 2 diabetes mellitus with diabetic chronic kidney disease: Secondary | ICD-10-CM | POA: Diagnosis not present

## 2022-12-28 DIAGNOSIS — N186 End stage renal disease: Secondary | ICD-10-CM | POA: Diagnosis not present

## 2022-12-28 DIAGNOSIS — Z992 Dependence on renal dialysis: Secondary | ICD-10-CM | POA: Diagnosis not present

## 2022-12-28 DIAGNOSIS — I120 Hypertensive chronic kidney disease with stage 5 chronic kidney disease or end stage renal disease: Secondary | ICD-10-CM | POA: Diagnosis not present

## 2022-12-28 DIAGNOSIS — K5900 Constipation, unspecified: Secondary | ICD-10-CM | POA: Diagnosis not present

## 2022-12-28 LAB — COMPREHENSIVE METABOLIC PANEL
ALT: 9 U/L (ref 0–44)
AST: 19 U/L (ref 15–41)
Albumin: 3.8 g/dL (ref 3.5–5.0)
Alkaline Phosphatase: 118 U/L (ref 38–126)
Anion gap: 16 — ABNORMAL HIGH (ref 5–15)
BUN: 43 mg/dL — ABNORMAL HIGH (ref 6–20)
CO2: 23 mmol/L (ref 22–32)
Calcium: 9.1 mg/dL (ref 8.9–10.3)
Chloride: 94 mmol/L — ABNORMAL LOW (ref 98–111)
Creatinine, Ser: 6.83 mg/dL — ABNORMAL HIGH (ref 0.44–1.00)
GFR, Estimated: 7 mL/min — ABNORMAL LOW (ref >60)
Glucose, Bld: 180 mg/dL — ABNORMAL HIGH (ref 70–99)
Potassium: 3.6 mmol/L (ref 3.5–5.1)
Sodium: 133 mmol/L — ABNORMAL LOW (ref 135–145)
Total Bilirubin: 1.9 mg/dL — ABNORMAL HIGH (ref 0.3–1.2)
Total Protein: 7.4 g/dL (ref 6.5–8.1)

## 2022-12-28 LAB — CBC
HCT: 28.1 % — ABNORMAL LOW (ref 36.0–46.0)
Hemoglobin: 8.7 g/dL — ABNORMAL LOW (ref 12.0–15.0)
MCH: 29.4 pg (ref 26.0–34.0)
MCHC: 31 g/dL (ref 30.0–36.0)
MCV: 94.9 fL (ref 80.0–100.0)
Platelets: 140 10*3/uL — ABNORMAL LOW (ref 150–400)
RBC: 2.96 MIL/uL — ABNORMAL LOW (ref 3.87–5.11)
RDW: 14.2 % (ref 11.5–15.5)
WBC: 5.9 10*3/uL (ref 4.0–10.5)
nRBC: 0 % (ref 0.0–0.2)

## 2022-12-28 LAB — LIPASE, BLOOD: Lipase: 40 U/L (ref 11–51)

## 2022-12-28 MED ORDER — MILK AND MOLASSES ENEMA
1.0000 | Freq: Once | RECTAL | Status: AC
  Administered 2022-12-28: 240 mL via RECTAL
  Filled 2022-12-28: qty 240

## 2022-12-28 MED ORDER — SIMETHICONE 80 MG PO CHEW
520.0000 mg | CHEWABLE_TABLET | Freq: Once | ORAL | Status: AC
  Administered 2022-12-28: 520 mg via ORAL
  Filled 2022-12-28 (×2): qty 7

## 2022-12-28 MED ORDER — SIMETHICONE 500 MG PO CAPS: 1.0000 | ORAL_CAPSULE | Freq: Three times a day (TID) | ORAL | 0 refills | Status: DC | PRN

## 2022-12-28 NOTE — ED Triage Notes (Addendum)
Pt to ED via [facility] from home c/o constipation. Pt has been constipates since Monday. Has done 3 enemas this morning with no relief. Pt also states she vomited at around 1am. Now having RLQ pain. Denies CP, fevers, SOB. Pt is dialysis pt

## 2022-12-28 NOTE — ED Notes (Signed)
Milk and molasses enema completed by this Clinical research associate and Shanda Bumps, NT. Pt was only able to explel a moderate amount of liquidy green and black diarrhea. Pt states she does not feel relief and still feels constipated. EDP made aware.

## 2022-12-28 NOTE — Discharge Instructions (Signed)
Follow-up with your primary care provider if any continued problems.  A prescription for simethicone was sent to the pharmacy to take every 8 hours as needed for gas.  Continue drinking lots of fluids to stay hydrated.  Also you may start taking MiraLAX which will not interfere with any of your medications.  You can take 1-2 capfuls each day followed by 8 ounces of water to prevent constipation and also use a stool softener such as Colace especially when taking iron.

## 2022-12-28 NOTE — ED Notes (Signed)
See triage note  Presents with constipation  States she had BM on Monday.   States she had an iron infusion since  No fever   Positive n/v times 1

## 2022-12-28 NOTE — ED Triage Notes (Signed)
EMS brings pt in from home for c/o constipation since Monday; dialysis is due this morning

## 2022-12-28 NOTE — ED Provider Notes (Signed)
[facility] Provider Note    Event Date/Time   First MD Initiated Contact with Patient 12/28/22 [phone]     (approximate)   History   Constipation and Emesis   HPI  Cindy Osborne is a 57 y.o. female   presents to the ED via EMS from home with complaint of constipation.  Patient states that she has been constipated for approximately 4 days.  Patient has been taking Dulcolax and used 3 enemas this morning without any relief.  Patient vomited approximately 1 AM but has drank coffee since that time without any continued vomiting.  Patient denies any fever, chills and currently denies any abdominal pain.  Patient reports that she is supposed to have dialysis today.  She has a history of hypertension, diabetes type 2, PVD, CVA, GERD, end-stage renal disease, PAF.      Physical Exam   Triage Vital Signs: ED Triage Vitals  Enc Vitals Group     BP 12/28/22 0700 (!) 143/76     Pulse Rate 12/28/22 0700 73     Resp 12/28/22 0700 20     Temp 12/28/22 0700 97.6 F (36.4 C)     Temp Source 12/28/22 0700 Oral     SpO2 12/28/22 0700 99 %     Weight 12/28/22 0717 209 lb 14.1 oz (95.2 kg)     Height 12/28/22 0717 5\' 9"  (1.753 m)     Head Circumference --      Peak Flow --      Pain Score 12/28/22 0659 5     Pain Loc --      Pain Edu? --      Excl. in GC? --     Most recent vital signs: Vitals:   12/28/22 0700 12/28/22 1101  BP: (!) 143/76 (!) 140/70  Pulse: 73 70  Resp: 20 18  Temp: 97.6 F (36.4 C) 98 F (36.7 C)  SpO2: 99% 99%     General: Awake, no distress.  CV:  Good peripheral perfusion.  Resp:  Normal effort.  Abd:  No distention.  Soft, nontender, bowel sounds present x 4 quadrants. Other:     ED Results / Procedures / Treatments   Labs (all labs ordered are listed, but only abnormal results are displayed) Labs Reviewed  COMPREHENSIVE METABOLIC PANEL - Abnormal; Notable for the following components:      Result Value   Sodium 133  (*)    Chloride 94 (*)    Glucose, Bld 180 (*)    BUN 43 (*)    Creatinine, Ser 6.83 (*)    Total Bilirubin 1.9 (*)    GFR, Estimated 7 (*)    Anion gap 16 (*)    All other components within normal limits  CBC - Abnormal; Notable for the following components:   RBC 2.96 (*)    Hemoglobin 8.7 (*)    HCT 28.1 (*)    Platelets 140 (*)    All other components within normal limits  LIPASE, BLOOD  URINALYSIS, ROUTINE W REFLEX MICROSCOPIC  POC URINE PREG, ED       RADIOLOGY 1 view x-ray abdomen images were reviewed by myself independent of the radiologist and negative for large stool burden but moderate amount of nonobstructive gas noted.    PROCEDURES:  Critical Care performed:   Procedures   MEDICATIONS ORDERED IN ED: Medications  milk and molasses enema (240 mLs Rectal Given 12/28/22 0846)  simethicone (MYLICON) chewable tablet 520 mg (520 mg  Oral Given 12/28/22 1009)     IMPRESSION / MDM / ASSESSMENT AND PLAN / ED COURSE  I reviewed the triage vital signs and the nursing notes.   Differential diagnosis includes, but is not limited to, constipation, bowel obstruction, ileus, abdominal pain.  57 year old female presents to the ED via EMS with complaint of constipation.  Patient states that she has had constipation for approximately 3 days.  Patient reports that she had 3 enemas this morning and did not get any relief but also reports that she passed a large stool.  Caregiver was in the ED and also confirms this.  Patient denied any abdominal pain on exam and x-ray is consistent with moderate amount of gas with out a large stool burden.  Patient was given a milk of molasses enema while in the ED along with simethicone for gas.  A prescription for simethicone was sent to the pharmacy for her to continue taking.  She is encouraged to begin taking MiraLAX daily and titrate according to her constipation and whether or not she is getting any more iron injections.  She is to  follow-up with her PCP if any continued problems.      Patient's presentation is most consistent with acute illness / injury with system symptoms.  FINAL CLINICAL IMPRESSION(S) / ED DIAGNOSES   Final diagnoses:  Constipation, unspecified constipation type     Rx / DC Orders   ED Discharge Orders          Ordered    Simethicone 500 MG CAPS  Every 8 hours PRN        12/28/22 0956             Note:  This document was prepared using Dragon voice recognition software and may include unintentional dictation errors.   Tommi Rumps, PA-C 12/28/22 1235    Merwyn Katos, MD 12/28/22 [phone]

## 2022-12-28 NOTE — ED Notes (Signed)
When ambulated from the wheelchair to the bed. This tech noticed small BM on sheets the pt was sitting on. Peri-care preformed at this time.

## 2023-01-01 ENCOUNTER — Encounter: Payer: Medicare Other | Admitting: Physical Therapy

## 2023-01-01 ENCOUNTER — Other Ambulatory Visit: Payer: Self-pay

## 2023-01-01 ENCOUNTER — Inpatient Hospital Stay
Admission: EM | Admit: 2023-01-01 | Discharge: 2023-01-03 | DRG: 189 | Disposition: A | Discharge status: Home / Self Care | Payer: Medicare Other | Attending: Internal Medicine | Admitting: Internal Medicine

## 2023-01-01 ENCOUNTER — Observation Stay: Payer: Medicare Other

## 2023-01-01 ENCOUNTER — Emergency Department: Payer: Medicare Other

## 2023-01-01 DIAGNOSIS — Z833 Family history of diabetes mellitus: Secondary | ICD-10-CM

## 2023-01-01 DIAGNOSIS — Z8673 Personal history of transient ischemic attack (TIA), and cerebral infarction without residual deficits: Secondary | ICD-10-CM

## 2023-01-01 DIAGNOSIS — Z992 Dependence on renal dialysis: Secondary | ICD-10-CM

## 2023-01-01 DIAGNOSIS — R0603 Acute respiratory distress: Secondary | ICD-10-CM | POA: Diagnosis present

## 2023-01-01 DIAGNOSIS — I132 Hypertensive heart and chronic kidney disease with heart failure and with stage 5 chronic kidney disease, or end stage renal disease: Secondary | ICD-10-CM | POA: Diagnosis present

## 2023-01-01 DIAGNOSIS — Z1152 Encounter for screening for COVID-19: Secondary | ICD-10-CM

## 2023-01-01 DIAGNOSIS — J9601 Acute respiratory failure with hypoxia: Secondary | ICD-10-CM | POA: Diagnosis not present

## 2023-01-01 DIAGNOSIS — Z91013 Allergy to seafood: Secondary | ICD-10-CM

## 2023-01-01 DIAGNOSIS — E1122 Type 2 diabetes mellitus with diabetic chronic kidney disease: Secondary | ICD-10-CM | POA: Diagnosis present

## 2023-01-01 DIAGNOSIS — Z89511 Acquired absence of right leg below knee: Secondary | ICD-10-CM

## 2023-01-01 DIAGNOSIS — J9811 Atelectasis: Secondary | ICD-10-CM | POA: Diagnosis present

## 2023-01-01 DIAGNOSIS — E785 Hyperlipidemia, unspecified: Secondary | ICD-10-CM | POA: Diagnosis present

## 2023-01-01 DIAGNOSIS — D696 Thrombocytopenia, unspecified: Secondary | ICD-10-CM | POA: Diagnosis present

## 2023-01-01 DIAGNOSIS — E1129 Type 2 diabetes mellitus with other diabetic kidney complication: Secondary | ICD-10-CM | POA: Diagnosis present

## 2023-01-01 DIAGNOSIS — R5383 Other fatigue: Secondary | ICD-10-CM | POA: Diagnosis present

## 2023-01-01 DIAGNOSIS — Z882 Allergy status to sulfonamides status: Secondary | ICD-10-CM

## 2023-01-01 DIAGNOSIS — R531 Weakness: Secondary | ICD-10-CM | POA: Diagnosis present

## 2023-01-01 DIAGNOSIS — I5032 Chronic diastolic (congestive) heart failure: Secondary | ICD-10-CM | POA: Diagnosis present

## 2023-01-01 DIAGNOSIS — G9339 Other post infection and related fatigue syndromes: Secondary | ICD-10-CM | POA: Diagnosis not present

## 2023-01-01 DIAGNOSIS — R7401 Elevation of levels of liver transaminase levels: Secondary | ICD-10-CM | POA: Diagnosis present

## 2023-01-01 DIAGNOSIS — Z9049 Acquired absence of other specified parts of digestive tract: Secondary | ICD-10-CM

## 2023-01-01 DIAGNOSIS — I10 Essential (primary) hypertension: Secondary | ICD-10-CM | POA: Diagnosis present

## 2023-01-01 DIAGNOSIS — J90 Pleural effusion, not elsewhere classified: Secondary | ICD-10-CM | POA: Diagnosis present

## 2023-01-01 DIAGNOSIS — Z91010 Allergy to peanuts: Secondary | ICD-10-CM

## 2023-01-01 DIAGNOSIS — I959 Hypotension, unspecified: Secondary | ICD-10-CM | POA: Diagnosis present

## 2023-01-01 DIAGNOSIS — Z89512 Acquired absence of left leg below knee: Secondary | ICD-10-CM

## 2023-01-01 DIAGNOSIS — Z7901 Long term (current) use of anticoagulants: Secondary | ICD-10-CM

## 2023-01-01 DIAGNOSIS — I480 Paroxysmal atrial fibrillation: Secondary | ICD-10-CM | POA: Diagnosis present

## 2023-01-01 DIAGNOSIS — N186 End stage renal disease: Secondary | ICD-10-CM

## 2023-01-01 DIAGNOSIS — E1151 Type 2 diabetes mellitus with diabetic peripheral angiopathy without gangrene: Secondary | ICD-10-CM | POA: Diagnosis present

## 2023-01-01 DIAGNOSIS — Z79899 Other long term (current) drug therapy: Secondary | ICD-10-CM

## 2023-01-01 DIAGNOSIS — R17 Unspecified jaundice: Secondary | ICD-10-CM | POA: Diagnosis present

## 2023-01-01 DIAGNOSIS — D631 Anemia in chronic kidney disease: Secondary | ICD-10-CM | POA: Diagnosis present

## 2023-01-01 DIAGNOSIS — Z888 Allergy status to other drugs, medicaments and biological substances status: Secondary | ICD-10-CM

## 2023-01-01 DIAGNOSIS — I6930 Unspecified sequelae of cerebral infarction: Secondary | ICD-10-CM

## 2023-01-01 DIAGNOSIS — Z89421 Acquired absence of other right toe(s): Secondary | ICD-10-CM

## 2023-01-01 DIAGNOSIS — N2581 Secondary hyperparathyroidism of renal origin: Secondary | ICD-10-CM | POA: Diagnosis present

## 2023-01-01 LAB — CBC WITH DIFFERENTIAL/PLATELET
Abs Immature Granulocytes: 0.03 10*3/uL (ref 0.00–0.07)
Basophils Absolute: 0 10*3/uL (ref 0.0–0.1)
Basophils Relative: 0 %
Eosinophils Absolute: 0.2 10*3/uL (ref 0.0–0.5)
Eosinophils Relative: 3 %
HCT: 25.7 % — ABNORMAL LOW (ref 36.0–46.0)
Hemoglobin: 8 g/dL — ABNORMAL LOW (ref 12.0–15.0)
Immature Granulocytes: 1 %
Lymphocytes Relative: 29 %
Lymphs Abs: 1.8 10*3/uL (ref 0.7–4.0)
MCH: 29.9 pg (ref 26.0–34.0)
MCHC: 31.1 g/dL (ref 30.0–36.0)
MCV: 95.9 fL (ref 80.0–100.0)
Monocytes Absolute: 0.5 10*3/uL (ref 0.1–1.0)
Monocytes Relative: 7 %
Neutro Abs: 3.8 10*3/uL (ref 1.7–7.7)
Neutrophils Relative %: 60 %
Platelets: 133 10*3/uL — ABNORMAL LOW (ref 150–400)
RBC: 2.68 MIL/uL — ABNORMAL LOW (ref 3.87–5.11)
RDW: 16.2 % — ABNORMAL HIGH (ref 11.5–15.5)
WBC: 6.3 10*3/uL (ref 4.0–10.5)
nRBC: 0.3 % — ABNORMAL HIGH (ref 0.0–0.2)

## 2023-01-01 LAB — CBG MONITORING, ED: Glucose-Capillary: 149 mg/dL — ABNORMAL HIGH (ref 70–99)

## 2023-01-01 LAB — CBC
HCT: 26.9 % — ABNORMAL LOW (ref 36.0–46.0)
Hemoglobin: 8.4 g/dL — ABNORMAL LOW (ref 12.0–15.0)
MCH: 29.7 pg (ref 26.0–34.0)
MCHC: 31.2 g/dL (ref 30.0–36.0)
MCV: 95.1 fL (ref 80.0–100.0)
Platelets: 141 10*3/uL — ABNORMAL LOW (ref 150–400)
RBC: 2.83 MIL/uL — ABNORMAL LOW (ref 3.87–5.11)
RDW: 16.2 % — ABNORMAL HIGH (ref 11.5–15.5)
WBC: 7.7 10*3/uL (ref 4.0–10.5)
nRBC: 0.3 % — ABNORMAL HIGH (ref 0.0–0.2)

## 2023-01-01 LAB — BASIC METABOLIC PANEL
Anion gap: 15 (ref 5–15)
BUN: 42 mg/dL — ABNORMAL HIGH (ref 6–20)
CO2: 26 mmol/L (ref 22–32)
Calcium: 9.4 mg/dL (ref 8.9–10.3)
Chloride: 97 mmol/L — ABNORMAL LOW (ref 98–111)
Creatinine, Ser: 7.01 mg/dL — ABNORMAL HIGH (ref 0.44–1.00)
GFR, Estimated: 6 mL/min — ABNORMAL LOW (ref >60)
Glucose, Bld: 134 mg/dL — ABNORMAL HIGH (ref 70–99)
Potassium: 4.3 mmol/L (ref 3.5–5.1)
Sodium: 138 mmol/L (ref 135–145)

## 2023-01-01 LAB — TSH: TSH: 2.546 u[IU]/mL (ref 0.350–4.500)

## 2023-01-01 LAB — HEPATIC FUNCTION PANEL
ALT: 190 U/L — ABNORMAL HIGH (ref 0–44)
AST: 151 U/L — ABNORMAL HIGH (ref 15–41)
Albumin: 3.9 g/dL (ref 3.5–5.0)
Alkaline Phosphatase: 166 U/L — ABNORMAL HIGH (ref 38–126)
Bilirubin, Direct: 0.6 mg/dL — ABNORMAL HIGH (ref 0.0–0.2)
Indirect Bilirubin: 1.9 mg/dL — ABNORMAL HIGH (ref 0.3–0.9)
Total Bilirubin: 2.5 mg/dL — ABNORMAL HIGH (ref 0.3–1.2)
Total Protein: 7.3 g/dL (ref 6.5–8.1)

## 2023-01-01 LAB — BRAIN NATRIURETIC PEPTIDE: B Natriuretic Peptide: >4500 pg/mL — ABNORMAL HIGH (ref 0.0–100.0)

## 2023-01-01 LAB — TROPONIN I (HIGH SENSITIVITY): Troponin I (High Sensitivity): 63 ng/L — ABNORMAL HIGH (ref <18)

## 2023-01-01 MED ORDER — PROMETHAZINE HCL 25 MG PO TABS: 12.5000 mg | ORAL_TABLET | Freq: Four times a day (QID) | ORAL | Status: DC | PRN

## 2023-01-01 MED ORDER — HYDROXYZINE HCL 10 MG PO TABS: 10.0000 mg | ORAL_TABLET | Freq: Three times a day (TID) | ORAL | Status: DC | PRN

## 2023-01-01 MED ORDER — ACETAMINOPHEN 325 MG PO TABS: 650.0000 mg | ORAL_TABLET | Freq: Four times a day (QID) | ORAL | Status: DC | PRN

## 2023-01-01 MED ORDER — MIDODRINE HCL 5 MG PO TABS
10.0000 mg | ORAL_TABLET | Freq: Three times a day (TID) | ORAL | Status: DC
  Administered 2023-01-02 – 2023-01-03 (×4): 10 mg via ORAL
  Filled 2023-01-01 (×5): qty 2

## 2023-01-01 MED ORDER — ONDANSETRON 4 MG PO TBDP
4.0000 mg | ORAL_TABLET | Freq: Once | ORAL | Status: AC
  Administered 2023-01-01: 4 mg via ORAL
  Filled 2023-01-01: qty 1

## 2023-01-01 MED ORDER — APIXABAN 2.5 MG PO TABS
2.5000 mg | ORAL_TABLET | Freq: Two times a day (BID) | ORAL | Status: DC
  Administered 2023-01-01 – 2023-01-03 (×4): 2.5 mg via ORAL
  Filled 2023-01-01 (×4): qty 1

## 2023-01-01 MED ORDER — CHLORHEXIDINE GLUCONATE CLOTH 2 % EX PADS
6.0000 | MEDICATED_PAD | Freq: Every day | CUTANEOUS | Status: DC
  Administered 2023-01-03: 6 via TOPICAL
  Filled 2023-01-01: qty 6

## 2023-01-01 MED ORDER — IPRATROPIUM-ALBUTEROL 0.5-2.5 (3) MG/3ML IN SOLN: 3.0000 mL | RESPIRATORY_TRACT | Status: DC | PRN

## 2023-01-01 MED ORDER — SODIUM CHLORIDE 0.9% FLUSH
3.0000 mL | Freq: Two times a day (BID) | INTRAVENOUS | Status: DC
  Administered 2023-01-01 – 2023-01-03 (×4): 3 mL via INTRAVENOUS

## 2023-01-01 MED ORDER — ACETAMINOPHEN 650 MG RE SUPP: 650.0000 mg | Freq: Four times a day (QID) | RECTAL | Status: DC | PRN

## 2023-01-01 MED ORDER — SENNOSIDES-DOCUSATE SODIUM 8.6-50 MG PO TABS: 1.0000 | ORAL_TABLET | Freq: Every evening | ORAL | Status: DC | PRN

## 2023-01-01 MED ORDER — INSULIN ASPART 100 UNIT/ML IJ SOLN
0.0000 [IU] | Freq: Three times a day (TID) | INTRAMUSCULAR | Status: DC
  Administered 2023-01-02: 2 [IU] via SUBCUTANEOUS
  Filled 2023-01-01: qty 1

## 2023-01-01 MED ORDER — SIMETHICONE 80 MG PO CHEW: 80.0000 mg | CHEWABLE_TABLET | Freq: Three times a day (TID) | ORAL | Status: DC | PRN

## 2023-01-01 MED ORDER — METOPROLOL SUCCINATE ER 25 MG PO TB24
25.0000 mg | ORAL_TABLET | Freq: Every day | ORAL | Status: DC
  Administered 2023-01-01 – 2023-01-03 (×3): 25 mg via ORAL
  Filled 2023-01-01 (×3): qty 1

## 2023-01-01 NOTE — ED Provider Notes (Signed)
[facility] Provider Note    Event Date/Time   First MD Initiated Contact with Patient 01/01/23 1624     (approximate)   History   Weakness and Shortness of Breath   HPI  Cindy Osborne is a 57 y.o. female  with a history of ESRD on dialysis (T/TH/S), hypertension, hyperlipidemia, diabetes, CVA, peripheral vascular disease status post bilateral BKA, atrial fibrillation on Eliquis, diastolic CHF, and anemia who presents with generalized weakness since yesterday, persistent course, worse if she gets up or tries to walk around or do any ADLs.  It is associated with shortness of breath and lightheadedness.  She denies any chest pain, cough, fever, or any vomiting or diarrhea, but she does have nausea.  She states her last dialysis session was 2 days ago and proceeded normally although no fluid needed to be taken off at that time.  She has not had any leg swelling.  I reviewed the past medical records.  The patient was most recently admitted in October of last year; per the hospitalist discharge summary from 10/21 she was treated for sepsis and H CAP.  She was last seen in the ED on 5/16 constipation.   Physical Exam   Triage Vital Signs: ED Triage Vitals  Enc Vitals Group     BP 01/01/23 1316 132/70     Pulse Rate 01/01/23 1316 78     Resp 01/01/23 1316 18     Temp 01/01/23 1316 98.2 F (36.8 C)     Temp Source 01/01/23 1316 Oral     SpO2 01/01/23 1316 100 %     Weight 01/01/23 1313 209 lb 14.1 oz (95.2 kg)     Height 01/01/23 1313 5\' 9"  (1.753 m)     Head Circumference --      Peak Flow --      Pain Score 01/01/23 1313 0     Pain Loc --      Pain Edu? --      Excl. in GC? --     Most recent vital signs: Vitals:   01/01/23 2345 01/02/23 0000  BP: 123/67 109/67  Pulse: 65 64  Resp: 20   Temp:  98.2 F (36.8 C)  SpO2: 95% 95%     General: Awake, no distress.  CV:  Good peripheral perfusion.  Resp:  Normal effort.  Somewhat diminished  breath sounds bilaterally. Abd:  Soft and nontender.  No distention.  Other:  No significant peripheral edema.   ED Results / Procedures / Treatments   Labs (all labs ordered are listed, but only abnormal results are displayed) Labs Reviewed  BASIC METABOLIC PANEL - Abnormal; Notable for the following components:      Result Value   Chloride 97 (*)    Glucose, Bld 134 (*)    BUN 42 (*)    Creatinine, Ser 7.01 (*)    GFR, Estimated 6 (*)    All other components within normal limits  CBC - Abnormal; Notable for the following components:   RBC 2.83 (*)    Hemoglobin 8.4 (*)    HCT 26.9 (*)    RDW 16.2 (*)    Platelets 141 (*)    nRBC 0.3 (*)    All other components within normal limits  BRAIN NATRIURETIC PEPTIDE - Abnormal; Notable for the following components:   B Natriuretic Peptide >4,500.0 (*)    All other components within normal limits  HEPATIC FUNCTION PANEL - Abnormal; Notable for the following  components:   AST 151 (*)    ALT 190 (*)    Alkaline Phosphatase 166 (*)    Total Bilirubin 2.5 (*)    Bilirubin, Direct 0.6 (*)    Indirect Bilirubin 1.9 (*)    All other components within normal limits  CBC WITH DIFFERENTIAL/PLATELET - Abnormal; Notable for the following components:   RBC 2.68 (*)    Hemoglobin 8.0 (*)    HCT 25.7 (*)    RDW 16.2 (*)    Platelets 133 (*)    nRBC 0.3 (*)    All other components within normal limits  CBG MONITORING, ED - Abnormal; Notable for the following components:   Glucose-Capillary 149 (*)    All other components within normal limits  TROPONIN I (HIGH SENSITIVITY) - Abnormal; Notable for the following components:   Troponin I (High Sensitivity) 63 (*)    All other components within normal limits  TSH  URINALYSIS, ROUTINE W REFLEX MICROSCOPIC  HEMOGLOBIN A1C  COMPREHENSIVE METABOLIC PANEL  HEPATITIS A ANTIBODY, IGM  TECHNOLOGIST SMEAR REVIEW  POC URINE PREG, ED     EKG  ED ECG REPORT I, Dionne Bucy, the attending  physician, personally viewed and interpreted this ECG.  Date: 01/01/2023 EKG Time: 1315 Rate: 79 Rhythm: normal sinus rhythm QRS Axis: normal Intervals: Prolonged QTc ST/T Wave abnormalities: Nonspecific ST abnormalities Narrative Interpretation: no evidence of acute ischemia    RADIOLOGY  Chest x-ray: I independently viewed and interpreted the images; there is a small right pleural effusion but otherwise no focal consolidation or edema   PROCEDURES:  Critical Care performed: No  Procedures   MEDICATIONS ORDERED IN ED: Medications  metoprolol succinate (TOPROL-XL) 24 hr tablet 25 mg (25 mg Oral Given 01/01/23 2132)  midodrine (PROAMATINE) tablet 10 mg (has no administration in time range)  hydrOXYzine (ATARAX) tablet 10 mg (has no administration in time range)  simethicone (MYLICON) chewable tablet 80 mg (has no administration in time range)  apixaban (ELIQUIS) tablet 2.5 mg (2.5 mg Oral Given 01/01/23 2206)  insulin aspart (novoLOG) injection 0-6 Units (has no administration in time range)  sodium chloride flush (NS) 0.9 % injection 3 mL (3 mLs Intravenous Given 01/01/23 2206)  acetaminophen (TYLENOL) tablet 650 mg (has no administration in time range)    Or  acetaminophen (TYLENOL) suppository 650 mg (has no administration in time range)  senna-docusate (Senokot-S) tablet 1 tablet (has no administration in time range)  promethazine (PHENERGAN) tablet 12.5 mg (has no administration in time range)  ipratropium-albuterol (DUONEB) 0.5-2.5 (3) MG/3ML nebulizer solution 3 mL (has no administration in time range)  Chlorhexidine Gluconate Cloth 2 % PADS 6 each (has no administration in time range)  ondansetron (ZOFRAN-ODT) disintegrating tablet 4 mg (4 mg Oral Given 01/01/23 1316)     IMPRESSION / MDM / ASSESSMENT AND PLAN / ED COURSE  I reviewed the triage vital signs and the nursing notes.  57 year old female with PMH as noted above presents with increased shortness of breath,  generalized weakness, and lightheadedness since yesterday.  Vital signs are normal except that the patient was found to be hypoxic to the mid 80s; physical exam is otherwise unremarkable for acute findings.  Differential diagnosis includes, but is not limited to, fluid overload due to ESRD, CHF exacerbation, pleural effusion, ACS, anemia, dehydration, electrolyte abnormality or other metabolic disturbance.  Initial lab workup reveals no significant electrolyte abnormalities.  The patient is more anemic than baseline.  Troponin and BNP are pending.  Given the hypoxia  the patient will likely need admission.  However, the patient does not demonstrate acute respiratory distress or any indication for emergent dialysis tonight.  Patient's presentation is most consistent with acute presentation with potential threat to life or bodily function.  The patient is on the cardiac monitor to evaluate for evidence of arrhythmia and/or significant heart rate changes.   I consulted Dr. Criselda Peaches from the hospitalist service; based on our discussion she agrees to evaluate the patient for admission.   FINAL CLINICAL IMPRESSION(S) / ED DIAGNOSES   Final diagnoses:  Acute respiratory failure with hypoxia (HCC)     Rx / DC Orders   ED Discharge Orders     None        Note:  This document was prepared using Dragon voice recognition software and may include unintentional dictation errors.    Dionne Bucy, MD 01/02/23 [phone]

## 2023-01-01 NOTE — ED Notes (Signed)
Patient AOX4. Resp even, unlabored on 4L per [place]. NSR on monitor at 70's. Denies pain. [place] sandwich provided to patient per request. Repositioned for comfort. No distress noted at this time.

## 2023-01-01 NOTE — ED Provider Triage Note (Signed)
Emergency Medicine Provider Triage Evaluation Note  Cindy Osborne, a 57 y.o. female  was evaluated in triage.  Pt complains of SOB, weakness, nausea and . She has a new LBBB and was cardioverted 2 weeks ago.  Review of Systems  Positive: NV, SOB Negative: FCS  Physical Exam  BP 132/70 (BP Location: Right Arm)   Pulse 78   Temp 98.2 F (36.8 C) (Oral)   Resp 18   Ht 5\' 9"  (1.753 m)   Wt 95.2 kg   SpO2 100%   BMI 30.99 kg/m  Gen:   Awake, no distress  NAD Resp:  Normal effort CTA MSK:   Moves extremities without difficulty  Other:    Medical Decision Making  Medically screening exam initiated at 1:17 PM.  Appropriate orders placed.  Cindy Osborne was informed that the remainder of the evaluation will be completed by another provider, this initial triage assessment does not replace that evaluation, and the importance of remaining in the ED until their evaluation is complete.  Patient with a history of ESRD on dialysis, presents to the ED for shortness of breath, generalized weakness, nausea vomiting.  She presents via EMS from home.   Lissa Hoard, PA-C 01/01/23 1333

## 2023-01-01 NOTE — ED Triage Notes (Signed)
Pt here with SOB, weakness, and nausea. Pt has not missed any of her dialysis but have not taken extra fluid off. Pt has a L BBB, no hx of same. Pt was cardioverted 2 weeks ago by ems. Pt states she has not been able to keep anything down.    77 126/60 100% RA 148/cbg

## 2023-01-01 NOTE — H&P (Addendum)
History and Physical    Cindy Osborne ZOX:[id number] DOB: 1965-12-28 DOA: 01/01/2023  PCP: Reubin Milan, MD  Patient coming from: Home  Chief Complaint: Fatigue, SOB  HPI: Cindy Osborne is a 57 y.o. female with medical history significant of ESRD, h/o stroke, HLD, HTN, Afib on Eliquis who presents for fatigue and SOB.  Cindy Osborne notes that she has been having fatigue for the past week, worse after her Saturday dialysis session.  She has been trying to eat and drink less to keep fluid off of her.  She notes that they did not remove fluid at last HD session.  Further symptoms include fatigue when doing normal activities such as walking in her house or removing her prosthetics from the LE.  She is concerned about her hemoglobin, though notes no blood loss anywhere.  She has been adherent to her HD schedule.  She has no chest pain, fever, chills, sick contacts, diarrhea, constipation.  She does have some nausea, but no emesis.  She occasionally feels palpitations, but attributes this to her h/o afib.  She has been taking her metoprolol and midodrine (for orthostasis).  She has been on Levaquin since the 15th for concern for pneumonia seen on her chest xray.  At that time she had SOB.  Her Qtc today is 509, up from 481 at last check.   ED Course: In the ED, blood work showed Cr of 7.01, she is due for HD tomorrow. TnI was 63 (previously 509 on 12/16/22), Hgb of 8.4, which is stable, though below previous baseline.  CXR showed a stable pleural effusion on the right.  She was found to be hypoxic to the mid 80s and was placed on oxygen therapy.   Review of Systems: As per HPI otherwise all other systems reviewed and are negative.  Past Medical History:  Diagnosis Date   Acute ischemic right MCA stroke (HCC) 03/25/2022   Amputated toe (HCC) 01/31/2012   Right second toe distal phalanx Right great toe   Diabetes mellitus without complication (HCC)    History of diabetic ulcer of foot 04/01/2014    Hyperlipidemia    Hypertension    Stroke (cerebrum) (HCC) 03/21/2022   Stroke (HCC) 01/21/2022    Past Surgical History:  Procedure Laterality Date   angioplasty politeal Left 07/2020   avg for dialysis     CHOLECYSTECTOMY     COLONOSCOPY  08/15/2011   cleared for 10 yrs   IR CT HEAD LTD  01/21/2022   IR CT HEAD LTD  03/21/2022   IR PERCUTANEOUS ART THROMBECTOMY/INFUSION INTRACRANIAL INC DIAG ANGIO  01/21/2022   IR PERCUTANEOUS ART THROMBECTOMY/INFUSION INTRACRANIAL INC DIAG ANGIO  03/21/2022   IR US GUIDE VASC ACCESS RIGHT  01/21/2022   IR US GUIDE VASC ACCESS RIGHT  03/21/2022   LEG AMPUTATION BELOW KNEE Right 04/2019   LEG AMPUTATION BELOW KNEE Left 09/08/2020   RADIOLOGY WITH ANESTHESIA N/A 01/21/2022   Procedure: IR WITH ANESTHESIA;  Surgeon: Radiologist, Medication, MD;  Location: [facility] OR;  Service: Radiology;  Laterality: N/A;   RADIOLOGY WITH ANESTHESIA N/A 03/21/2022   Procedure: IR WITH ANESTHESIA;  Surgeon: Radiologist, Medication, MD;  Location: [facility] OR;  Service: Radiology;  Laterality: N/A;   TOE AMPUTATION Right 10/15/2018    Social History  reports that she has never smoked. She has never used smokeless tobacco. She reports current alcohol use. She reports that she does not use drugs.  Allergies  Allergen Reactions   Augmentin [Amoxicillin-Pot Clavulanate] Shortness Of Breath  Atorvastatin Other (See Comments)    Back pain   Pioglitazone Other (See Comments)    Edema   Peanut-Containing Drug Products Itching   Rosuvastatin Itching and Other (See Comments)    Back pain   Shellfish-Derived Products Itching and Rash    shrimp   Sulfa Antibiotics Rash and Other (See Comments)    Shedding of skin    Family History  Problem Relation Age of Onset   Diabetes Mother    Diabetes Sister     Prior to Admission medications   Medication Sig Start Date End Date Taking? Authorizing Provider  apixaban (ELIQUIS) 2.5 MG TABS tablet Take 1 tablet (2.5 mg total) by mouth 2 (two)  times daily. 11/27/22   Penumalli, Glenford Bayley, MD  hydrOXYzine (ATARAX) 10 MG tablet TAKE 1 TABLET (10 MG TOTAL) BY MOUTH 3 (THREE) TIMES DAILY AS NEEDED FOR ANXIETY OR ITCHING. 12/27/22   Reubin Milan, MD  hydrOXYzine (ATARAX) 25 MG tablet Take 25 mg by mouth 3 (three) times daily as needed for anxiety.    [provider]  levofloxacin (LEVAQUIN) 250 MG tablet 2 tabs on day 1 then 1 tab on days 3, 5 and 7. 12/27/22   Reubin Milan, MD  metoprolol succinate (TOPROL XL) 25 MG 24 hr tablet Take 1 tablet (25 mg total) by mouth daily. 12/18/22   Debbe Odea, MD  midodrine (PROAMATINE) 10 MG tablet Take 1 tablet (10 mg total) by mouth 3 (three) times daily with meals. 12/18/22   Debbe Odea, MD  Simethicone 500 MG CAPS Take 1 tablet by mouth every 8 (eight) hours as needed (gas). 12/28/22   Tommi Rumps, PA-C    Physical Exam: Vitals:   01/01/23 1742 01/01/23 1800 01/01/23 1830 01/01/23 1845  BP:  113/72 135/83   Pulse:  70 73   Resp: 18 17  17   Temp:      TempSrc:      SpO2: 94% 100% 100%   Weight:      Height:        Constitutional: NAD, calm, comfortable Eyes: lids and conjunctivae normal ENMT: Mucous membranes are moist. Neck: normal, supple Respiratory: Decreased breath sounds with crackles at right base, otherwise clear, no wheezing, normal WOB.  Cardiovascular: RR, NR, murmur heard at LUSB related to fistula, AV access with good thrill and bruit, no edema to bilateral stumps.  Abdomen: +BS, NT, ND, soft Musculoskeletal: She has bilateral BKA, stumps are clean and dry without wounds.  Skin: no rashes, lesions, ulcers on exposed skin, fistula is clean, dry and intact Neurologic: Grossly intact, she has a previous stroke with right sided arm weakness, this is barely discernible on today's exam.  Psychiatric: Normal judgment and insight. Alert and oriented x 3. Normal mood.    Labs on Admission: I have personally reviewed following labs and imaging  studies  CBC: Recent Labs  Lab 12/28/22 0704 01/01/23 1415  WBC 5.9 7.7  HGB 8.7* 8.4*  HCT 28.1* 26.9*  MCV 94.9 95.1  PLT 140* 141*    Basic Metabolic Panel: Recent Labs  Lab 12/28/22 0704 01/01/23 1415  NA 133* 138  K 3.6 4.3  CL 94* 97*  CO2 23 26  GLUCOSE 180* 134*  BUN 43* 42*  CREATININE 6.83* 7.01*  CALCIUM 9.1 9.4    GFR: Estimated Creatinine Clearance: 10.9 mL/min (A) (by C-G formula based on SCr of 7.01 mg/dL (H)).  Liver Function Tests: Recent Labs  Lab 12/28/22 0704  AST  19  ALT 9  ALKPHOS 118  BILITOT 1.9*  PROT 7.4  ALBUMIN 3.8    Urine analysis:    Component Value Date/Time   BILIRUBINUR neg 06/09/2021 1128   PROTEINUR Positive (A) 06/09/2021 1128   UROBILINOGEN 0.2 06/09/2021 1128   NITRITE neg' 06/09/2021 1128   LEUKOCYTESUR Large (3+) (A) 06/09/2021 1128    Radiological Exams on Admission: DG Chest 2 View  Result Date: 01/01/2023 CLINICAL DATA:  Shortness of breath. EXAM: CHEST - 2 VIEW COMPARISON:  Dec 27, 2022. FINDINGS: Stable cardiomediastinal silhouette. Stable small right pleural effusion is noted with associated right basilar atelectasis. Left lung is unremarkable. Bony thorax is unremarkable. IMPRESSION: Stable small right pleural effusion with associated right basilar atelectasis or scarring. Electronically Signed   By: Lupita Raider M.D.   On: 01/01/2023 13:48    EKG: Independently reviewed. QTc is 509, otherwise LVH noted  Assessment/Plan  Fatigue Hypoxic respiratory failure - Fatigue seems to be main issues, possible DDx includes worsening anemia (she is on the downward trend) vs. Fluid overload which is lagging behind imaging vs. FQ related with QTc prolongation vs. Other such as hypothyroidism - Hold levaquin (can cause fatigue, encephalopathy) - Nephrology consulted for HD, would have them also consult on anemia in ESRD - Oxygen to keep saturation > 90% - Check TSH - Monitor telemetry - Ins/outs, daily weights  to evaluate for volume overload - EKG in the AM to evaluate QTc - Wean oxygen to off as able - Duonebs PRN for wheezing, SOB  UPDATE: Hepatic function panel with elevated LFTs, elevated bilirubin and elevated ALP.  Will plan to check RUQ [place], hepatitis A.  DDx includes acute hepatitis A, gallbladder disease with elevated bilirubin vs. Congestive hepatopathy given also with hypoxia.  Further work up pending these evaluations.  Will trend these labs and avoid fluids.  Levaquin doesn't seem to be a culprit for these findings.  Will also add a blood smear given thrombocytopenia.    Pleural effusion on right - Small and chronic, monitor  Anemia in ESRD (end-stage renal disease) (HCC) ESRD (end stage renal disease) on dialysis [facility]) - Nephrology consulted for HD tomorrow    Essential hypertension PAF - Continue metoprolol, eliquis    Acquired absence of right leg below knee (HCC) Acquired absence of left leg below knee (HCC) - Allow use of prosthetics - PT/OT    History of stroke with current residual effects - Monitor, continue eliquis    Type II diabetes mellitus with renal manifestations (HCC) - SSI   DVT prophylaxis: Lovenox  Code Status:   Full  Disposition Plan:   Patient is from:  Home  Anticipated DC to:  HOme  Anticipated DC date:  01/02/23 after HD  Anticipated DC barriers: Improvement in fatigue  Consults called:  Nephrology  Admission status:  Obs, telemetry  Severity of Illness: The appropriate patient status for this patient is OBSERVATION. Observation status is judged to be reasonable and necessary in order to provide the required intensity of service to ensure the patient's safety. The patient's presenting symptoms, physical exam findings, and initial radiographic and laboratory data in the context of their medical condition is felt to place them at decreased risk for further clinical deterioration. Furthermore, it is anticipated that the patient will be medically  stable for discharge from the hospital within 2 midnights of admission.     Debe Coder MD [facility]  How to contact the [facility] Attending or Consulting provider 7A -  7P or covering provider during after hours 7P -7A, for this patient?   Check the care team in [facility] and look for a) attending/consulting [facility] provider listed and b) the [facility] team listed Log into [email or web address] and use [place]'s universal password to access. If you do not have the password, please contact the hospital operator. Locate the [facility] provider you are looking for under [facility] and page to a number that you can be directly reached. If you still have difficulty reaching the provider, please page the [facility] (Director on Call) for the Hospitalists listed on amion for assistance.  01/01/2023, 7:13 PM

## 2023-01-02 ENCOUNTER — Other Ambulatory Visit: Payer: Self-pay

## 2023-01-02 ENCOUNTER — Encounter: Payer: Self-pay | Admitting: Internal Medicine

## 2023-01-02 ENCOUNTER — Telehealth: Payer: Self-pay | Admitting: Internal Medicine

## 2023-01-02 DIAGNOSIS — I480 Paroxysmal atrial fibrillation: Secondary | ICD-10-CM | POA: Diagnosis present

## 2023-01-02 DIAGNOSIS — E1151 Type 2 diabetes mellitus with diabetic peripheral angiopathy without gangrene: Secondary | ICD-10-CM | POA: Diagnosis present

## 2023-01-02 DIAGNOSIS — Z888 Allergy status to other drugs, medicaments and biological substances status: Secondary | ICD-10-CM | POA: Diagnosis not present

## 2023-01-02 DIAGNOSIS — Z89512 Acquired absence of left leg below knee: Secondary | ICD-10-CM | POA: Diagnosis not present

## 2023-01-02 DIAGNOSIS — R7401 Elevation of levels of liver transaminase levels: Secondary | ICD-10-CM | POA: Diagnosis present

## 2023-01-02 DIAGNOSIS — J9601 Acute respiratory failure with hypoxia: Secondary | ICD-10-CM | POA: Diagnosis present

## 2023-01-02 DIAGNOSIS — I5032 Chronic diastolic (congestive) heart failure: Secondary | ICD-10-CM | POA: Diagnosis present

## 2023-01-02 DIAGNOSIS — R0603 Acute respiratory distress: Secondary | ICD-10-CM | POA: Diagnosis present

## 2023-01-02 DIAGNOSIS — Z9049 Acquired absence of other specified parts of digestive tract: Secondary | ICD-10-CM | POA: Diagnosis not present

## 2023-01-02 DIAGNOSIS — R5383 Other fatigue: Secondary | ICD-10-CM | POA: Diagnosis not present

## 2023-01-02 DIAGNOSIS — Z833 Family history of diabetes mellitus: Secondary | ICD-10-CM | POA: Diagnosis not present

## 2023-01-02 DIAGNOSIS — N2581 Secondary hyperparathyroidism of renal origin: Secondary | ICD-10-CM | POA: Diagnosis present

## 2023-01-02 DIAGNOSIS — N186 End stage renal disease: Secondary | ICD-10-CM | POA: Diagnosis present

## 2023-01-02 DIAGNOSIS — I959 Hypotension, unspecified: Secondary | ICD-10-CM | POA: Diagnosis present

## 2023-01-02 DIAGNOSIS — E785 Hyperlipidemia, unspecified: Secondary | ICD-10-CM | POA: Diagnosis present

## 2023-01-02 DIAGNOSIS — Z992 Dependence on renal dialysis: Secondary | ICD-10-CM | POA: Diagnosis not present

## 2023-01-02 DIAGNOSIS — Z8673 Personal history of transient ischemic attack (TIA), and cerebral infarction without residual deficits: Secondary | ICD-10-CM | POA: Diagnosis not present

## 2023-01-02 DIAGNOSIS — J9811 Atelectasis: Secondary | ICD-10-CM | POA: Diagnosis present

## 2023-01-02 DIAGNOSIS — Z1152 Encounter for screening for COVID-19: Secondary | ICD-10-CM | POA: Diagnosis not present

## 2023-01-02 DIAGNOSIS — D631 Anemia in chronic kidney disease: Secondary | ICD-10-CM | POA: Diagnosis present

## 2023-01-02 DIAGNOSIS — R17 Unspecified jaundice: Secondary | ICD-10-CM | POA: Diagnosis present

## 2023-01-02 DIAGNOSIS — Z7901 Long term (current) use of anticoagulants: Secondary | ICD-10-CM | POA: Diagnosis not present

## 2023-01-02 DIAGNOSIS — D696 Thrombocytopenia, unspecified: Secondary | ICD-10-CM | POA: Diagnosis present

## 2023-01-02 DIAGNOSIS — E1122 Type 2 diabetes mellitus with diabetic chronic kidney disease: Secondary | ICD-10-CM | POA: Diagnosis present

## 2023-01-02 DIAGNOSIS — Z89511 Acquired absence of right leg below knee: Secondary | ICD-10-CM | POA: Diagnosis not present

## 2023-01-02 DIAGNOSIS — I132 Hypertensive heart and chronic kidney disease with heart failure and with stage 5 chronic kidney disease, or end stage renal disease: Secondary | ICD-10-CM | POA: Diagnosis present

## 2023-01-02 LAB — COMPREHENSIVE METABOLIC PANEL
ALT: 153 U/L — ABNORMAL HIGH (ref 0–44)
AST: 77 U/L — ABNORMAL HIGH (ref 15–41)
Albumin: 3.6 g/dL (ref 3.5–5.0)
Alkaline Phosphatase: 178 U/L — ABNORMAL HIGH (ref 38–126)
Anion gap: 13 (ref 5–15)
BUN: 46 mg/dL — ABNORMAL HIGH (ref 6–20)
CO2: 26 mmol/L (ref 22–32)
Calcium: 9.1 mg/dL (ref 8.9–10.3)
Chloride: 98 mmol/L (ref 98–111)
Creatinine, Ser: 7.63 mg/dL — ABNORMAL HIGH (ref 0.44–1.00)
GFR, Estimated: 6 mL/min — ABNORMAL LOW (ref >60)
Glucose, Bld: 118 mg/dL — ABNORMAL HIGH (ref 70–99)
Potassium: 4.4 mmol/L (ref 3.5–5.1)
Sodium: 137 mmol/L (ref 135–145)
Total Bilirubin: 2.1 mg/dL — ABNORMAL HIGH (ref 0.3–1.2)
Total Protein: 7.2 g/dL (ref 6.5–8.1)

## 2023-01-02 LAB — URINALYSIS, ROUTINE W REFLEX MICROSCOPIC
Bilirubin Urine: NEGATIVE
Glucose, UA: NEGATIVE mg/dL
Ketones, ur: NEGATIVE mg/dL
Nitrite: NEGATIVE
Protein, ur: >=300 mg/dL — AB
Specific Gravity, Urine: 1.011 (ref 1.005–1.030)
pH: 8 (ref 5.0–8.0)

## 2023-01-02 LAB — CBG MONITORING, ED
Glucose-Capillary: 96 mg/dL (ref 70–99)
Glucose-Capillary: 109 mg/dL — ABNORMAL HIGH (ref 70–99)

## 2023-01-02 LAB — TECHNOLOGIST SMEAR REVIEW
Clinical Information: ELEVATED
Plt Morphology: NORMAL

## 2023-01-02 LAB — CBC
HCT: 25.3 % — ABNORMAL LOW (ref 36.0–46.0)
Hemoglobin: 8 g/dL — ABNORMAL LOW (ref 12.0–15.0)
MCH: 30 pg (ref 26.0–34.0)
MCHC: 31.6 g/dL (ref 30.0–36.0)
MCV: 94.8 fL (ref 80.0–100.0)
Platelets: 137 10*3/uL — ABNORMAL LOW (ref 150–400)
RBC: 2.67 MIL/uL — ABNORMAL LOW (ref 3.87–5.11)
RDW: 16.3 % — ABNORMAL HIGH (ref 11.5–15.5)
WBC: 5.6 10*3/uL (ref 4.0–10.5)
nRBC: 0.4 % — ABNORMAL HIGH (ref 0.0–0.2)

## 2023-01-02 LAB — HEPATITIS B SURFACE ANTIGEN: Hepatitis B Surface Ag: NONREACTIVE

## 2023-01-02 LAB — POC URINE PREG, ED: Preg Test, Ur: NEGATIVE

## 2023-01-02 LAB — HEMOGLOBIN A1C
Hgb A1c MFr Bld: 5.3 % (ref 4.8–5.6)
Mean Plasma Glucose: 105.41 mg/dL

## 2023-01-02 LAB — GLUCOSE, CAPILLARY
Glucose-Capillary: 215 mg/dL — ABNORMAL HIGH (ref 70–99)
Glucose-Capillary: 102 mg/dL — ABNORMAL HIGH (ref 70–99)

## 2023-01-02 LAB — HEPATITIS A ANTIBODY, IGM: Hep A IgM: NONREACTIVE

## 2023-01-02 MED ORDER — ONDANSETRON HCL 4 MG/2ML IJ SOLN: 4.0000 mg | Freq: Four times a day (QID) | INTRAMUSCULAR | Status: DC | PRN

## 2023-01-02 MED ORDER — TRAZODONE HCL 50 MG PO TABS
50.0000 mg | ORAL_TABLET | Freq: Every evening | ORAL | Status: DC | PRN
  Administered 2023-01-02: 50 mg via ORAL
  Filled 2023-01-02: qty 1

## 2023-01-02 MED ORDER — HYDRALAZINE HCL 20 MG/ML IJ SOLN: 10.0000 mg | INTRAMUSCULAR | Status: DC | PRN

## 2023-01-02 MED ORDER — HEPARIN SODIUM (PORCINE) 1000 UNIT/ML DIALYSIS
20.0000 [IU]/kg | INTRAMUSCULAR | Status: DC | PRN
  Administered 2023-01-02: 1900 [IU] via INTRAVENOUS_CENTRAL

## 2023-01-02 MED ORDER — GUAIFENESIN 100 MG/5ML PO LIQD: 5.0000 mL | ORAL | Status: DC | PRN

## 2023-01-02 MED ORDER — METOPROLOL TARTRATE 5 MG/5ML IV SOLN: 5.0000 mg | INTRAVENOUS | Status: DC | PRN

## 2023-01-02 MED ORDER — HEPARIN SODIUM (PORCINE) 1000 UNIT/ML IJ SOLN
INTRAMUSCULAR | Status: AC
  Filled 2023-01-02: qty 2

## 2023-01-02 NOTE — ED Notes (Signed)
Phlebotomy called requesting tech to draw morning labs.

## 2023-01-02 NOTE — Telephone Encounter (Signed)
Copied from CRM [id number]. Topic: General - Other >> Jan 02, 2023 11:34 AM Epimenio Foot F wrote: Reason for CRM: Pt is calling in because she is returning a call from the office. Pt says she will call another time because she is currently in the hospital.

## 2023-01-02 NOTE — Progress Notes (Signed)
PROGRESS NOTE    Cindy Osborne  ZOX:[id number] DOB: [birth year] DOA: 01/01/2023 PCP: Reubin Milan, MD   Brief Narrative:  57 year old with history of ESRD, history of CVA, A-fib on Eliquis, HTN, HLD admitted for fatigue and shortness of breath.  Last routine HD session was on Saturday but since then has had increasing fatigue.  She has been on outpatient Levaquin since 15 due to concerns of pneumonia on the chest x-ray.  Upon admission she was noted to have some stable pleural effusion but was found to be hypoxic.  Nephro was consulted for hemodialysis   Assessment & Plan:  Principal Problem:   Fatigue Active Problems:   Pleural effusion on right   Anemia in ESRD (end-stage renal disease) (HCC)   ESRD (end stage renal disease) on dialysis (HCC)   Essential hypertension   History of stroke with current residual effects   HLD (hyperlipidemia)   Type II diabetes mellitus with renal manifestations (HCC)   PAF (paroxysmal atrial fibrillation) (HCC)   Acquired absence of right leg below knee (HCC)   Acquired absence of left leg below knee (HCC)     Fatigue Hypoxic respiratory failure Suspect this is from volume overload.  Nephrology consulted getting hemodialysis today morning.  Hopefully this should help her symptoms. -TSH is normal.  Bronchodilators as needed.  Transaminitis - I suspect this is from hepatic congestion and should improve with dialysis.  Right upper quadrant ultrasound did not show any acute pathology.  Will order acute hepatitis panel    Pleural effusion on right - Small and chronic, monitor   Anemia in ESRD (end-stage renal disease) (HCC) ESRD (end stage renal disease) on dialysis (HCC) Chronic in nature.  Monitor hemoglobin and platelets.  No obvious evidence of bleeding.    PAF -Continue Toprol-XL.  Continue Eliquis - IV as needed  Intermittent hypotension - Midodrine 3 times daily    Acquired absence of right leg below knee (HCC) Acquired  absence of left leg below knee (HCC) - Allow use of prosthetics - PT/OT    History of stroke with current residual effects - Monitor, continue eliquis    Type II diabetes mellitus with renal manifestations (HCC) - SSI     DVT prophylaxis: Eliquis Code Status: Full code Family Communication:   Awaiting hypoxia to improve hopefully after dialysis.  Will also get PT/OT.       Diet Orders (From admission, onward)     Start     Ordered   01/01/23 1946  Diet renal with fluid restriction Fluid restriction: 1200 mL Fluid; Room service appropriate? Yes; Fluid consistency: Thin  Diet effective now       Question Answer Comment  Fluid restriction: 1200 mL Fluid   Room service appropriate? Yes   Fluid consistency: Thin      01/01/23 1945            Subjective:  Seen in HD, tells me her breathing is little better today.  Examination: Constitutional: Not in acute distress Respiratory: Minimal bibasilar crackles Cardiovascular: Normal sinus rhythm, no rubs Abdomen: Nontender nondistended good bowel sounds Musculoskeletal: No edema noted Skin: No rashes seen Neurologic: CN 2-12 grossly intact.  And nonfocal Psychiatric: Normal judgment and insight. Alert and oriented x 3. Normal mood.   Objective: Vitals:   01/02/23 0805 01/02/23 0819 01/02/23 0830 01/02/23 0900  BP: 135/74 109/86 130/70 (!) 133/111  Pulse: 63 66 66 72  Resp: 20 16 (!) 22 12  Temp: 97.6 F (36.4 C)  TempSrc: Oral     SpO2: 100% 98% 98% 100%  Weight:      Height:        Intake/Output Summary (Last 24 hours) at 01/02/2023 0931 Last data filed at 01/02/2023 0757 Gross per 24 hour  Intake 237 ml  Output --  Net 237 ml   Filed Weights   01/01/23 1313  Weight: 95.2 kg    Scheduled Meds:  apixaban  2.5 mg Oral BID   Chlorhexidine Gluconate Cloth  6 each Topical Q0600   insulin aspart  0-6 Units Subcutaneous TID WC   metoprolol succinate  25 mg Oral Daily   midodrine  10 mg Oral TID WC    sodium chloride flush  3 mL Intravenous Q12H   Continuous Infusions:  Nutritional status     Body mass index is 30.99 kg/m.  Data Reviewed:   CBC: Recent Labs  Lab 12/28/22 0704 01/01/23 1415 01/01/23 2212 01/02/23 0835  WBC 5.9 7.7 6.3 5.6  NEUTROABS  --   --  3.8  --   HGB 8.7* 8.4* 8.0* 8.0*  HCT 28.1* 26.9* 25.7* 25.3*  MCV 94.9 95.1 95.9 94.8  PLT 140* 141* 133* 137*   Basic Metabolic Panel: Recent Labs  Lab 12/28/22 0704 01/01/23 1415 01/02/23 0537  NA 133* 138 137  K 3.6 4.3 4.4  CL 94* 97* 98  CO2 23 26 26   GLUCOSE 180* 134* 118*  BUN 43* 42* 46*  CREATININE 6.83* 7.01* 7.63*  CALCIUM 9.1 9.4 9.1   GFR: Estimated Creatinine Clearance: 10 mL/min (A) (by C-G formula based on SCr of 7.63 mg/dL (H)). Liver Function Tests: Recent Labs  Lab 12/28/22 0704 01/01/23 1637 01/02/23 0537  AST 19 151* 77*  ALT 9 190* 153*  ALKPHOS 118 166* 178*  BILITOT 1.9* 2.5* 2.1*  PROT 7.4 7.3 7.2  ALBUMIN 3.8 3.9 3.6   Recent Labs  Lab 12/28/22 0704  LIPASE 40   No results for input(s): "AMMONIA" in the last 168 hours. Coagulation Profile: No results for input(s): "INR", "PROTIME" in the last 168 hours. Cardiac Enzymes: No results for input(s): "CKTOTAL", "CKMB", "CKMBINDEX", "TROPONINI" in the last 168 hours. BNP (last 3 results) No results for input(s): "PROBNP" in the last 8760 hours. HbA1C: Recent Labs    01/01/23 1413  HGBA1C 5.3   CBG: Recent Labs  Lab 01/01/23 2126 01/02/23 0724  GLUCAP 149* 96   Lipid Profile: No results for input(s): "CHOL", "HDL", "LDLCALC", "TRIG", "CHOLHDL", "LDLDIRECT" in the last 72 hours. Thyroid Function Tests: Recent Labs    01/01/23 2212  TSH 2.546   Anemia Panel: No results for input(s): "VITAMINB12", "FOLATE", "FERRITIN", "TIBC", "IRON", "RETICCTPCT" in the last 72 hours. Sepsis Labs: No results for input(s): "PROCALCITON", "LATICACIDVEN" in the last 168 hours.  No results found for this or any previous  visit (from the past 240 hour(s)).       Radiology Studies: US Abdomen Limited RUQ (LIVER/GB)  Result Date: 01/02/2023 CLINICAL DATA:  Transaminitis.  History of cholecystectomy. EXAM: ULTRASOUND ABDOMEN LIMITED RIGHT UPPER QUADRANT COMPARISON:  CT and MRCP 05/30/2022 FINDINGS: Gallbladder: Surgically absent. Common bile duct: Diameter: 8 mm.  No intrahepatic biliary dilation. Liver: No focal lesion identified. Question subtle nodularity of the hepatic contour. Within normal limits in parenchymal echogenicity. Portal vein is patent on color Doppler imaging with normal direction of blood flow towards the liver. Other: Incidental note of right pleural effusion. Trace perihepatic fluid. IMPRESSION: 1. Question subtle nodularity of the hepatic contour  which can be seen with cirrhosis. 2. Trace perihepatic fluid. 3. Incidental note of right pleural effusion. Electronically Signed   By: Minerva Fester M.D.   On: 01/02/2023 01:13   DG Chest 2 View  Result Date: 01/01/2023 CLINICAL DATA:  Shortness of breath. EXAM: CHEST - 2 VIEW COMPARISON:  Dec 27, 2022. FINDINGS: Stable cardiomediastinal silhouette. Stable small right pleural effusion is noted with associated right basilar atelectasis. Left lung is unremarkable. Bony thorax is unremarkable. IMPRESSION: Stable small right pleural effusion with associated right basilar atelectasis or scarring. Electronically Signed   By: Lupita Raider M.D.   On: 01/01/2023 13:48           LOS: 0 days   Time spent= 35 mins    Giovanie Lefebre Joline Maxcy, MD [facility]  If 7PM-7AM, please contact night-coverage  01/02/2023, 9:31 AM

## 2023-01-02 NOTE — ED Notes (Signed)
Pt to dialysis at this time

## 2023-01-02 NOTE — Progress Notes (Signed)
OT Cancellation Note  Patient Details Name: Cindy Osborne MRN: [id number] DOB: [birth year]   Cancelled Treatment:    Reason Eval/Treat Not Completed: Patient at procedure or test/ unavailable. Pt remains in HD this morning. OT will re-attempt evaluation when pt is next available.   Jackquline Denmark, MS, OTR/L , CBIS ascom [phone]  01/02/23, 12:27 PM

## 2023-01-02 NOTE — Progress Notes (Signed)
[facility]  ROUNDING NOTE   Subjective:   Cindy Osborne is a 57 y.o. female with past medical history of CVA, hypertension, hyperlipidemia, atrial fib on Eliquis, and end stage renal disease on hemodialysis. Patient presents to the ED with complaints of shortness of breath and progressive fatigue. She has been admitted for Acute respiratory distress [R06.03] Fatigue [R53.83] Acute respiratory failure with hypoxia (HCC) [J96.01]  Patient is known to our practice and received outpatietn dialysis treatments at [facility] on a TTS schedule, supervised by Dr Wynelle Link. She reports weakness and fatigue since starting dialysis 1 year ago. States this has not improved Denies sick contacts, fever or chills. Denies bloody Bms. Reports intermittent nausea. Denies any missed treatments and states she keeps her fluid intake low "so they don't have to pull a lot." States they haven't had to pull much fluid for the past few treatments.   Patient seen and evaluated during dialysis   HEMODIALYSIS FLOWSHEET:  Blood Flow Rate (mL/min): 400 mL/min Arterial Pressure (mmHg): -120 mmHg Venous Pressure (mmHg): 250 mmHg TMP (mmHg): 20 mmHg Ultrafiltration Rate (mL/min): 828 mL/min Dialysate Flow Rate (mL/min): 300 ml/min   BUN 42, creatinine 7.6, AST 151 ALT 191 and hemoglobin 8.4.  Chest x-ray showed small stable right pleural effusion.  Abdominal ultrasound questionable for hepatic cirrhosis. UA appears cloudy with protein  We have been consulted to manage dialysis needs during this admission.  Objective:  Vital signs in last 24 hours:  Temp:  [97.6 F (36.4 C)-98.3 F (36.8 C)] 97.9 F (36.6 C) (05/21 1345) Pulse Rate:  [63-94] 72 (05/21 1345) Resp:  [11-24] 18 (05/21 1345) BP: (109-137)/(58-111) 111/82 (05/21 1345) SpO2:  [94 %-100 %] 99 % (05/21 1345) Weight:  [90.2 kg-92.4 kg] 90.2 kg (05/21 1236)  Weight change:  Filed Weights   01/01/23 1313 01/02/23 0805 01/02/23 1236   Weight: 95.2 kg 92.4 kg 90.2 kg    Intake/Output: No intake/output data recorded.   Intake/Output this shift:  Total I/O In: 237 [P.O.:237] Out: 2000 [Other:2000]  Physical Exam: General: NAD  Head: Normocephalic, atraumatic. Moist oral mucosal membranes  Eyes: Anicteric  Lungs:  Clear to auscultation, normal effort, room air  Heart: Regular rate and rhythm  Abdomen:  Soft, nontender,   Extremities: No peripheral edema.  Neurologic: Alert and oriented, moving all four extremities  Skin: No lesions  Access: Left upper aVF    Basic Metabolic Panel: Recent Labs  Lab 12/28/22 0704 01/01/23 1415 01/02/23 0537  NA 133* 138 137  K 3.6 4.3 4.4  CL 94* 97* 98  CO2 23 26 26   GLUCOSE 180* 134* 118*  BUN 43* 42* 46*  CREATININE 6.83* 7.01* 7.63*  CALCIUM 9.1 9.4 9.1    Liver Function Tests: Recent Labs  Lab 12/28/22 0704 01/01/23 1637 01/02/23 0537  AST 19 151* 77*  ALT 9 190* 153*  ALKPHOS 118 166* 178*  BILITOT 1.9* 2.5* 2.1*  PROT 7.4 7.3 7.2  ALBUMIN 3.8 3.9 3.6   Recent Labs  Lab 12/28/22 0704  LIPASE 40   No results for input(s): "AMMONIA" in the last 168 hours.  CBC: Recent Labs  Lab 12/28/22 0704 01/01/23 1415 01/01/23 2212 01/02/23 0835  WBC 5.9 7.7 6.3 5.6  NEUTROABS  --   --  3.8  --   HGB 8.7* 8.4* 8.0* 8.0*  HCT 28.1* 26.9* 25.7* 25.3*  MCV 94.9 95.1 95.9 94.8  PLT 140* 141* 133* 137*    Cardiac Enzymes: No results for input(s): "  CKTOTAL", "CKMB", "CKMBINDEX", "TROPONINI" in the last 168 hours.  BNP: Invalid input(s): "POCBNP"  CBG: Recent Labs  Lab 01/01/23 2126 01/02/23 0724 01/02/23 1346  GLUCAP 149* 96 109*    Microbiology: Results for orders placed or performed during the hospital encounter of 05/30/22  Resp Panel by RT-PCR (Flu A&B, Covid) Anterior Nasal Swab     Status: None   Collection Time: 05/30/22  8:13 AM   Specimen: Anterior Nasal Swab  Result Value Ref Range Status   SARS Coronavirus 2 by RT PCR NEGATIVE  NEGATIVE Final    Comment: (NOTE) SARS-CoV-2 target nucleic acids are NOT DETECTED.  The SARS-CoV-2 RNA is generally detectable in upper respiratory specimens during the acute phase of infection. The lowest concentration of SARS-CoV-2 viral copies this assay can detect is 138 copies/mL. A negative result does not preclude SARS-Cov-2 infection and should not be used as the sole basis for treatment or other patient management decisions. A negative result may occur with  improper specimen collection/handling, submission of specimen other than nasopharyngeal swab, presence of viral mutation(s) within the areas targeted by this assay, and inadequate number of viral copies(<138 copies/mL). A negative result must be combined with clinical observations, patient history, and epidemiological information. The expected result is Negative.  Fact Sheet for Patients:  BloggerCourse.com  Fact Sheet for Healthcare Providers:  SeriousBroker.it  This test is no t yet approved or cleared by the [place] FDA and  has been authorized for detection and/or diagnosis of SARS-CoV-2 by FDA under an Emergency Use Authorization (EUA). This EUA will remain  in effect (meaning this test can be used) for the duration of the COVID-19 declaration under Section 564(b)(1) of the Act, 21 U.S.C.section 360bbb-3(b)(1), unless the authorization is terminated  or revoked sooner.       Influenza A by PCR NEGATIVE NEGATIVE Final   Influenza B by PCR NEGATIVE NEGATIVE Final    Comment: (NOTE) The Xpert Xpress SARS-CoV-2/FLU/RSV plus assay is intended as an aid in the diagnosis of influenza from Nasopharyngeal swab specimens and should not be used as a sole basis for treatment. Nasal washings and aspirates are unacceptable for Xpert Xpress SARS-CoV-2/FLU/RSV testing.  Fact Sheet for Patients: BloggerCourse.com  Fact Sheet for Healthcare  Providers: SeriousBroker.it  This test is not yet approved or cleared by the [place] FDA and has been authorized for detection and/or diagnosis of SARS-CoV-2 by FDA under an Emergency Use Authorization (EUA). This EUA will remain in effect (meaning this test can be used) for the duration of the COVID-19 declaration under Section 564(b)(1) of the Act, 21 U.S.C. section 360bbb-3(b)(1), unless the authorization is terminated or revoked.  Performed at [facility], [address] [place], [place] [address]   Blood Culture (routine x 2)     Status: None   Collection Time: 05/30/22  8:13 AM   Specimen: BLOOD  Result Value Ref Range Status   Specimen Description BLOOD BLOOD RIGHT ARM  Final   Special Requests   Final    BOTTLES DRAWN AEROBIC AND ANAEROBIC Blood Culture results may not be optimal due to an excessive volume of blood received in culture bottles   Culture   Final    NO GROWTH 5 DAYS Performed at [facility], [address] [place], [place] [address]    Report Status 06/04/2022 FINAL  Final  Blood Culture (routine x 2)     Status: None   Collection Time: 05/30/22  8:13 AM   Specimen: BLOOD  Result Value Ref Range Status   Specimen Description BLOOD RIGHT ANTECUBITAL  Final   Special Requests   Final    BOTTLES DRAWN AEROBIC AND ANAEROBIC Blood Culture results may not be optimal due to an excessive volume of blood received in culture bottles   Culture   Final    NO GROWTH 5 DAYS Performed at [facility], [address], [place], [place] [address]    Report Status 06/04/2022 FINAL  Final  Body fluid culture w Gram Stain     Status: None   Collection Time: 05/31/22 11:50 AM   Specimen: PATH Cytology Pleural fluid  Result Value Ref Range Status   Specimen Description   Final    PLEURAL Performed at [facility], [address] [place], [place] [address]    Special Requests   Final     NONE Performed at [facility], [address] [place], [place] [address]    Gram Stain NO WBC SEEN NO ORGANISMS SEEN   Final   Culture   Final    NO GROWTH 3 DAYS Performed at [facility], [address] [place], [place] [address]    Report Status 06/03/2022 FINAL  Final    Coagulation Studies: No results for input(s): "LABPROT", "INR" in the last 72 hours.  Urinalysis: Recent Labs    01/02/23 1352  COLORURINE YELLOW*  LABSPEC 1.011  PHURINE 8.0  GLUCOSEU NEGATIVE  HGBUR SMALL*  BILIRUBINUR NEGATIVE  KETONESUR NEGATIVE  PROTEINUR >=300*  NITRITE NEGATIVE  LEUKOCYTESUR TRACE*      Imaging: US Abdomen Limited RUQ (LIVER/GB)  Result Date: 01/02/2023 CLINICAL DATA:  Transaminitis.  History of cholecystectomy. EXAM: ULTRASOUND ABDOMEN LIMITED RIGHT UPPER QUADRANT COMPARISON:  CT and MRCP 05/30/2022 FINDINGS: Gallbladder: Surgically absent. Common bile duct: Diameter: 8 mm.  No intrahepatic biliary dilation. Liver: No focal lesion identified. Question subtle nodularity of the hepatic contour. Within normal limits in parenchymal echogenicity. Portal vein is patent on color Doppler imaging with normal direction of blood flow towards the liver. Other: Incidental note of right pleural effusion. Trace perihepatic fluid. IMPRESSION: 1. Question subtle nodularity of the hepatic contour which can be seen with cirrhosis. 2. Trace perihepatic fluid. 3. Incidental note of right pleural effusion. Electronically Signed   By: Minerva Fester M.D.   On: 01/02/2023 01:13   DG Chest 2 View  Result Date: 01/01/2023 CLINICAL DATA:  Shortness of breath. EXAM: CHEST - 2 VIEW COMPARISON:  Dec 27, 2022. FINDINGS: Stable cardiomediastinal silhouette. Stable small right pleural effusion is noted with associated right basilar atelectasis. Left lung is unremarkable. Bony thorax is unremarkable. IMPRESSION: Stable small right pleural effusion with associated right basilar atelectasis or  scarring. Electronically Signed   By: Lupita Raider M.D.   On: 01/01/2023 13:48     Medications:     apixaban  2.5 mg Oral BID   Chlorhexidine Gluconate Cloth  6 each Topical Q0600   insulin aspart  0-6 Units Subcutaneous TID WC   metoprolol succinate  25 mg Oral Daily   midodrine  10 mg Oral TID WC   sodium chloride flush  3 mL Intravenous Q12H   acetaminophen **OR** acetaminophen, guaiFENesin, hydrALAZINE, hydrOXYzine, ipratropium-albuterol, metoprolol tartrate, ondansetron (ZOFRAN) IV, promethazine, senna-docusate, simethicone, traZODone  Assessment/ Plan:  Ms. Cindy Osborne is a 57 y.o.  female with past medical history of CVA, hypertension, hyperlipidemia, atrial fib on Eliquis, and end stage renal disease on hemodialysis. Patient presents to the ED with  complaints of shortness of breath and progressive fatigue. She has been admitted for Acute respiratory distress [R06.03] Fatigue [R53.83] Acute respiratory failure with hypoxia (HCC) [J96.01]   End-stage renal disease on hemodialysis.  Last treatment completed on Saturday.  Patient receiving scheduled dialysis today, UF goal 2 L as tolerated.  Patient able to stand to obtain postdialysis weight, weight of prosthetics subtracted.  Patient prefers standing weights versus wheelchair weights received at outpatient dialysis clinic.  Will notify outpatient clinic of this request.  Next treatment scheduled for Thursday.  2.  Acute respiratory failure, requiring 2 L nasal cannula.  Baseline room air.  Chest x-ray shows a stable small right pleural effusion with right basilar atelectasis.  Receiving dialysis for fluid removal.  3. Anemia of chronic kidney disease Lab Results  Component Value Date   HGB 8.0 (L) 01/02/2023    Patient receives Mircera at outpatient clinic.  Hemoglobin decreased.  Will continue to monitor for now.  4. Secondary Hyperparathyroidism: with outpatient labs: PTH 2564, phosphorus 5.6, calcium 9.0 on 12/19/22.    Lab Results  Component Value Date   CALCIUM 9.1 01/02/2023   CAION 0.90 (L) 05/27/2022   PHOS 3.3 05/31/2022    Will continue to monitor bone minerals during this admission.  Calcium within acceptable range.  Patient prescribed Velphoro with meals outpatient.  5.  Diabetes mellitus type II with chronic kidney disease/renal manifestations: insulin dependent. Home regimen includes Humalog and Lantus. Most recent hemoglobin A1c is 5.3 on 01/01/23.     LOS: 0 Saba Gomm 5/21/20243:52 PM

## 2023-01-02 NOTE — Progress Notes (Signed)
Hemodialysis note  Received patient in bed to unit. Alert and oriented.  Informed consent signed and in chart.  Treatment initiated: 0819 Treatment completed: 1155  Patient tolerated well. Transported back to room, alert without acute distress.  Report given to patient's RN.   Access used: LUA AVF Access issues: none  Total UF removed: 2L Medication(s) given:  none  Post HD weight: 90.2 kg   [facility]

## 2023-01-02 NOTE — Plan of Care (Signed)
Problem: Education: Goal: Ability to describe self-care measures that may prevent or decrease complications (Diabetes Survival Skills Education) will improve 01/02/2023 1535 by Dillard Essex, RN Outcome: Progressing 01/02/2023 1535 by Dillard Essex, RN Outcome: Progressing Goal: Individualized Educational Video(s) 01/02/2023 1535 by Dillard Essex, RN Outcome: Progressing 01/02/2023 1535 by Dillard Essex, RN Outcome: Progressing   Problem: Coping: Goal: Ability to adjust to condition or change in health will improve 01/02/2023 1535 by Dillard Essex, RN Outcome: Progressing 01/02/2023 1535 by Dillard Essex, RN Outcome: Progressing   Problem: Fluid Volume: Goal: Ability to maintain a balanced intake and output will improve 01/02/2023 1535 by Dillard Essex, RN Outcome: Progressing 01/02/2023 1535 by Dillard Essex, RN Outcome: Progressing   Problem: Health Behavior/Discharge Planning: Goal: Ability to identify and utilize available resources and services will improve 01/02/2023 1535 by Dillard Essex, RN Outcome: Progressing 01/02/2023 1535 by Dillard Essex, RN Outcome: Progressing Goal: Ability to manage health-related needs will improve 01/02/2023 1535 by Dillard Essex, RN Outcome: Progressing 01/02/2023 1535 by Dillard Essex, RN Outcome: Progressing   Problem: Metabolic: Goal: Ability to maintain appropriate glucose levels will improve 01/02/2023 1535 by Dillard Essex, RN Outcome: Progressing 01/02/2023 1535 by Dillard Essex, RN Outcome: Progressing   Problem: Nutritional: Goal: Maintenance of adequate nutrition will improve 01/02/2023 1535 by Dillard Essex, RN Outcome: Progressing 01/02/2023 1535 by Dillard Essex, RN Outcome: Progressing Goal: Progress toward achieving an optimal weight will improve 01/02/2023 1535 by Dillard Essex, RN Outcome: Progressing 01/02/2023 1535 by Dillard Essex, RN Outcome: Progressing   Problem:  Skin Integrity: Goal: Risk for impaired skin integrity will decrease 01/02/2023 1535 by Dillard Essex, RN Outcome: Progressing 01/02/2023 1535 by Dillard Essex, RN Outcome: Progressing   Problem: Tissue Perfusion: Goal: Adequacy of tissue perfusion will improve 01/02/2023 1535 by Dillard Essex, RN Outcome: Progressing 01/02/2023 1535 by Dillard Essex, RN Outcome: Progressing   Problem: Education: Goal: Knowledge of General Education information will improve Description: Including pain rating scale, medication(s)/side effects and non-pharmacologic comfort measures 01/02/2023 1535 by Dillard Essex, RN Outcome: Progressing 01/02/2023 1535 by Dillard Essex, RN Outcome: Progressing   Problem: Health Behavior/Discharge Planning: Goal: Ability to manage health-related needs will improve 01/02/2023 1535 by Dillard Essex, RN Outcome: Progressing 01/02/2023 1535 by Dillard Essex, RN Outcome: Progressing   Problem: Clinical Measurements: Goal: Ability to maintain clinical measurements within normal limits will improve 01/02/2023 1535 by Dillard Essex, RN Outcome: Progressing 01/02/2023 1535 by Dillard Essex, RN Outcome: Progressing Goal: Will remain free from infection 01/02/2023 1535 by Dillard Essex, RN Outcome: Progressing 01/02/2023 1535 by Dillard Essex, RN Outcome: Progressing Goal: Diagnostic test results will improve 01/02/2023 1535 by Dillard Essex, RN Outcome: Progressing 01/02/2023 1535 by Dillard Essex, RN Outcome: Progressing Goal: Respiratory complications will improve 01/02/2023 1535 by Dillard Essex, RN Outcome: Progressing 01/02/2023 1535 by Dillard Essex, RN Outcome: Progressing Goal: Cardiovascular complication will be avoided 01/02/2023 1535 by Dillard Essex, RN Outcome: Progressing 01/02/2023 1535 by Dillard Essex, RN Outcome: Progressing   Problem: Activity: Goal: Risk for activity intolerance will  decrease 01/02/2023 1535 by Dillard Essex, RN Outcome: Progressing 01/02/2023 1535 by Dillard Essex, RN Outcome: Progressing   Problem: Nutrition: Goal: Adequate nutrition will be maintained 01/02/2023 1535 by Dillard Essex, RN Outcome: Progressing 01/02/2023 1535 by Dillard Essex, RN Outcome: Progressing  Problem: Coping: Goal: Level of anxiety will decrease 01/02/2023 1535 by Dillard Essex, RN Outcome: Progressing 01/02/2023 1535 by Dillard Essex, RN Outcome: Progressing   Problem: Elimination: Goal: Will not experience complications related to bowel motility 01/02/2023 1535 by Dillard Essex, RN Outcome: Progressing 01/02/2023 1535 by Dillard Essex, RN Outcome: Progressing Goal: Will not experience complications related to urinary retention 01/02/2023 1535 by Dillard Essex, RN Outcome: Progressing 01/02/2023 1535 by Dillard Essex, RN Outcome: Progressing   Problem: Pain Managment: Goal: General experience of comfort will improve 01/02/2023 1535 by Dillard Essex, RN Outcome: Progressing 01/02/2023 1535 by Dillard Essex, RN Outcome: Progressing   Problem: Safety: Goal: Ability to remain free from injury will improve Outcome: Progressing   Problem: Skin Integrity: Goal: Risk for impaired skin integrity will decrease Outcome: Progressing

## 2023-01-02 NOTE — ED Notes (Signed)
Pt repositioned in bed and titrated to 2L  at this time. Dialysis consent signed. Pt requesting breakfast, informed breakfast should arrive shortly. Pt denies needs, VSS, NAD, WCTM.

## 2023-01-02 NOTE — Progress Notes (Signed)
PT Cancellation Note  Patient Details Name: Cindy Osborne MRN: [id number] DOB: February 01, 1966   Cancelled Treatment:    Reason Eval/Treat Not Completed: Patient at procedure or test/unavailable (Pt unavailable for PT evaluation, OTF for HD then again for imaging on 2nd attempt. WIll defer evlauation to next day.)  5:31 PM, 01/02/23 Rosamaria Lints, PT, DPT Physical Therapist - [facility]  [phone] (ASCOM)    [name] 01/02/2023, 5:31 PM

## 2023-01-03 ENCOUNTER — Encounter: Payer: Medicare Other | Admitting: Physical Therapy

## 2023-01-03 DIAGNOSIS — R5383 Other fatigue: Secondary | ICD-10-CM | POA: Diagnosis not present

## 2023-01-03 LAB — COMPREHENSIVE METABOLIC PANEL
ALT: 122 U/L — ABNORMAL HIGH (ref 0–44)
AST: 47 U/L — ABNORMAL HIGH (ref 15–41)
Albumin: 4 g/dL (ref 3.5–5.0)
Alkaline Phosphatase: 167 U/L — ABNORMAL HIGH (ref 38–126)
Anion gap: 14 (ref 5–15)
BUN: 33 mg/dL — ABNORMAL HIGH (ref 6–20)
CO2: 28 mmol/L (ref 22–32)
Calcium: 8.8 mg/dL — ABNORMAL LOW (ref 8.9–10.3)
Chloride: 93 mmol/L — ABNORMAL LOW (ref 98–111)
Creatinine, Ser: 5.73 mg/dL — ABNORMAL HIGH (ref 0.44–1.00)
GFR, Estimated: 8 mL/min — ABNORMAL LOW (ref >60)
Glucose, Bld: 115 mg/dL — ABNORMAL HIGH (ref 70–99)
Potassium: 3.7 mmol/L (ref 3.5–5.1)
Sodium: 135 mmol/L (ref 135–145)
Total Bilirubin: 1.8 mg/dL — ABNORMAL HIGH (ref 0.3–1.2)
Total Protein: 7.7 g/dL (ref 6.5–8.1)

## 2023-01-03 LAB — MAGNESIUM: Magnesium: 2.4 mg/dL (ref 1.7–2.4)

## 2023-01-03 LAB — CBC
HCT: 30.3 % — ABNORMAL LOW (ref 36.0–46.0)
Hemoglobin: 9.5 g/dL — ABNORMAL LOW (ref 12.0–15.0)
MCH: 30 pg (ref 26.0–34.0)
MCHC: 31.4 g/dL (ref 30.0–36.0)
MCV: 95.6 fL (ref 80.0–100.0)
Platelets: 150 10*3/uL (ref 150–400)
RBC: 3.17 MIL/uL — ABNORMAL LOW (ref 3.87–5.11)
RDW: 16.4 % — ABNORMAL HIGH (ref 11.5–15.5)
WBC: 7.1 10*3/uL (ref 4.0–10.5)
nRBC: 0.4 % — ABNORMAL HIGH (ref 0.0–0.2)

## 2023-01-03 LAB — GLUCOSE, CAPILLARY: Glucose-Capillary: 150 mg/dL — ABNORMAL HIGH (ref 70–99)

## 2023-01-03 LAB — HEPATITIS PANEL, ACUTE
HCV Ab: NONREACTIVE
Hep A IgM: NONREACTIVE
Hep B C IgM: NONREACTIVE
Hepatitis B Surface Ag: NONREACTIVE

## 2023-01-03 LAB — HEPATITIS B SURFACE ANTIBODY, QUANTITATIVE: Hep B S AB Quant (Post): 68.8 m[IU]/mL (ref >9.9)

## 2023-01-03 NOTE — Discharge Summary (Signed)
Physician Discharge Summary  Cindy Osborne ZOX:[id number] DOB: 11-20-1965 DOA: 01/01/2023  PCP: Reubin Milan, MD  Admit date: 01/01/2023 Discharge date: 01/03/2023  Admitted From: Home Disposition:  Home  Recommendations for Outpatient Follow-up:  Follow up with PCP in 1-2 weeks Please obtain BMP/CBC in one week your next doctors visit.  Resume outpatient hemodialysis sessions.  Home Health: PT/OT Equipment/Devices: None Discharge Condition: Stable CODE STATUS: Full code Diet recommendation: Renal  Brief/Interim Summary:  57 year old with history of ESRD, history of CVA, A-fib on Eliquis, HTN, HLD admitted for fatigue and shortness of breath.  Last routine HD session was on Saturday but since then has had increasing fatigue.  She has been on outpatient Levaquin since 14 due to concerns of pneumonia on the chest x-ray.  Upon admission she was noted to have some stable pleural effusion but was found to be hypoxic.  Nephro was consulted for hemodialysis.  After receiving a session of dialysis patient felt much better and she did well with physical therapy the following day.  Today she is medically stable for discharge.     Assessment & Plan:  Principal Problem:   Fatigue Active Problems:   Pleural effusion on right   Anemia in ESRD (end-stage renal disease) (HCC)   ESRD (end stage renal disease) on dialysis (HCC)   Essential hypertension   History of stroke with current residual effects   HLD (hyperlipidemia)   Type II diabetes mellitus with renal manifestations (HCC)   PAF (paroxysmal atrial fibrillation) (HCC)   Acquired absence of right leg below knee (HCC)   Acquired absence of left leg below knee (HCC)      Fatigue, resolved Hypoxic respiratory failure, improved Patient received hemodialysis during hospitalization.  Did significantly well the following day. -TSH is normal.  Bronchodilators as needed.   Transaminitis, improved - I suspect this is from hepatic  congestion and should improve with dialysis.  Right upper quadrant ultrasound did not show any acute pathology.  Acute hepatitis panel was negative     Pleural effusion on right - Small and chronic, monitor   Anemia in ESRD (end-stage renal disease) (HCC) ESRD (end stage renal disease) on dialysis (HCC) Chronic in nature.  Monitor hemoglobin and platelets.  No obvious evidence of bleeding.    PAF -Continue Toprol-XL.  Continue Eliquis   Intermittent hypotension - Midodrine 3 times daily    Acquired absence of right leg below knee (HCC) Acquired absence of left leg below knee (HCC) - Allow use of prosthetics - PT/OT-home health services    History of stroke with current residual effects - Monitor, continue eliquis    Type II diabetes mellitus with renal manifestations (HCC) - SSI      Discharge Diagnoses:  Principal Problem:   Fatigue Active Problems:   Pleural effusion on right   Anemia in ESRD (end-stage renal disease) (HCC)   ESRD (end stage renal disease) on dialysis (HCC)   Essential hypertension   History of stroke with current residual effects   HLD (hyperlipidemia)   Type II diabetes mellitus with renal manifestations (HCC)   PAF (paroxysmal atrial fibrillation) (HCC)   Acquired absence of right leg below knee (HCC)   Acquired absence of left leg below knee (HCC)   Acute respiratory distress      Consultations: Nephrology  Subjective: Feels great no complaints.  Wishes to go home.  Sitting up in the recliner.  Discharge Exam: Vitals:   01/02/23 2347 01/03/23 0446  BP: 114/69 122/69  Pulse:  66 66  Resp: 16 16  Temp: 98.6 F (37 C) (!) 97.5 F (36.4 C)  SpO2: 100% 95%   Vitals:   01/02/23 2014 01/02/23 2347 01/03/23 0446 01/03/23 0500  BP: (!) 124/96 114/69 122/69   Pulse: 63 66 66   Resp: 16 16 16    Temp: 97.8 F (36.6 C) 98.6 F (37 C) (!) 97.5 F (36.4 C)   TempSrc: Oral     SpO2: 100% 100% 95%   Weight:    95.5 kg  Height:         General: Pt is alert, awake, not in acute distress Cardiovascular: RRR, S1/S2 +, no rubs, no gallops Respiratory: CTA bilaterally, no wheezing, no rhonchi Abdominal: Soft, NT, ND, bowel sounds + Extremities: no edema, no cyanosis Prosthetics noted bilaterally lower extremity Discharge Instructions   Allergies as of 01/03/2023       Reactions   Augmentin [amoxicillin-pot Clavulanate] Shortness Of Breath   Atorvastatin Other (See Comments)   Back pain   Pioglitazone Other (See Comments)   Edema   Peanut-containing Drug Products Itching   Rosuvastatin Itching, Other (See Comments)   Back pain   Shellfish-derived Products Itching, Rash   shrimp   Sulfa Antibiotics Rash, Other (See Comments)   Shedding of skin        Medication List     TAKE these medications    apixaban 2.5 MG Tabs tablet Commonly known as: Eliquis Take 1 tablet (2.5 mg total) by mouth 2 (two) times daily.   hydrOXYzine 25 MG tablet Commonly known as: ATARAX Take 25 mg by mouth 3 (three) times daily as needed for anxiety.   hydrOXYzine 10 MG tablet Commonly known as: ATARAX TAKE 1 TABLET (10 MG TOTAL) BY MOUTH 3 (THREE) TIMES DAILY AS NEEDED FOR ANXIETY OR ITCHING.   levofloxacin 250 MG tablet Commonly known as: LEVAQUIN 2 tabs on day 1 then 1 tab on days 3, 5 and 7.   metoprolol succinate 25 MG 24 hr tablet Commonly known as: Toprol XL Take 1 tablet (25 mg total) by mouth daily.   midodrine 10 MG tablet Commonly known as: PROAMATINE Take 1 tablet (10 mg total) by mouth 3 (three) times daily with meals.   Simethicone 500 MG Caps Take 1 tablet by mouth every 8 (eight) hours as needed (gas).        Follow-up Information     Reubin Milan, MD Follow up in 1 week(s).   Specialty: Internal Medicine Contact information: [address] [place] [address] [phone]                Allergies  Allergen Reactions   Augmentin [Amoxicillin-Pot Clavulanate]  Shortness Of Breath   Atorvastatin Other (See Comments)    Back pain   Pioglitazone Other (See Comments)    Edema   Peanut-Containing Drug Products Itching   Rosuvastatin Itching and Other (See Comments)    Back pain   Shellfish-Derived Products Itching and Rash    shrimp   Sulfa Antibiotics Rash and Other (See Comments)    Shedding of skin    You were cared for by a hospitalist during your hospital stay. If you have any questions about your discharge medications or the care you received while you were in the hospital after you are discharged, you can call the unit and asked to speak with the hospitalist on call if the hospitalist that took care of you is not available. Once you are discharged, your  primary care physician will handle any further medical issues. Please note that no refills for any discharge medications will be authorized once you are discharged, as it is imperative that you return to your primary care physician (or establish a relationship with a primary care physician if you do not have one) for your aftercare needs so that they can reassess your need for medications and monitor your lab values.  You were cared for by a hospitalist during your hospital stay. If you have any questions about your discharge medications or the care you received while you were in the hospital after you are discharged, you can call the unit and asked to speak with the hospitalist on call if the hospitalist that took care of you is not available. Once you are discharged, your primary care physician will handle any further medical issues. Please note that NO REFILLS for any discharge medications will be authorized once you are discharged, as it is imperative that you return to your primary care physician (or establish a relationship with a primary care physician if you do not have one) for your aftercare needs so that they can reassess your need for medications and monitor your lab values.  Please request  your Prim.MD to go over all Hospital Tests and Procedure/Radiological results at the follow up, please get all Hospital records sent to your Prim MD by signing hospital release before you go home.  Get CBC, CMP, 2 view Chest X ray checked  by Primary MD during your next visit or SNF MD in 5-7 days ( we routinely change or add medications that can affect your baseline labs and fluid status, therefore we recommend that you get the mentioned basic workup next visit with your PCP, your PCP may decide not to get them or add new tests based on their clinical decision)  On your next visit with your primary care physician please Get Medicines reviewed and adjusted.  If you experience worsening of your admission symptoms, develop shortness of breath, life threatening emergency, suicidal or homicidal thoughts you must seek medical attention immediately by calling 911 or calling your MD immediately  if symptoms less severe.  You Must read complete instructions/literature along with all the possible adverse reactions/side effects for all the Medicines you take and that have been prescribed to you. Take any new Medicines after you have completely understood and accpet all the possible adverse reactions/side effects.   Do not drive, operate heavy machinery, perform activities at heights, swimming or participation in water activities or provide baby sitting services if your were admitted for syncope or siezures until you have seen by Primary MD or a Neurologist and advised to do so again.  Do not drive when taking Pain medications.   Procedures/Studies: US Abdomen Limited RUQ (LIVER/GB)  Result Date: 01/02/2023 CLINICAL DATA:  Transaminitis.  History of cholecystectomy. EXAM: ULTRASOUND ABDOMEN LIMITED RIGHT UPPER QUADRANT COMPARISON:  CT and MRCP 05/30/2022 FINDINGS: Gallbladder: Surgically absent. Common bile duct: Diameter: 8 mm.  No intrahepatic biliary dilation. Liver: No focal lesion identified. Question  subtle nodularity of the hepatic contour. Within normal limits in parenchymal echogenicity. Portal vein is patent on color Doppler imaging with normal direction of blood flow towards the liver. Other: Incidental note of right pleural effusion. Trace perihepatic fluid. IMPRESSION: 1. Question subtle nodularity of the hepatic contour which can be seen with cirrhosis. 2. Trace perihepatic fluid. 3. Incidental note of right pleural effusion. Electronically Signed   By: Angelique Holm.D.  On: 01/02/2023 01:13   DG Chest 2 View  Result Date: 01/01/2023 CLINICAL DATA:  Shortness of breath. EXAM: CHEST - 2 VIEW COMPARISON:  Dec 27, 2022. FINDINGS: Stable cardiomediastinal silhouette. Stable small right pleural effusion is noted with associated right basilar atelectasis. Left lung is unremarkable. Bony thorax is unremarkable. IMPRESSION: Stable small right pleural effusion with associated right basilar atelectasis or scarring. Electronically Signed   By: Lupita Raider M.D.   On: 01/01/2023 13:48   DG Chest 2 View  Result Date: 12/28/2022 CLINICAL DATA:  57 year old female with constipation. Dialysis patient. Shortness of breath on exertion. EXAM: CHEST - 2 VIEW COMPARISON:  Portable chest 05/31/2022. CT Chest, Abdomen, and Pelvis 05/30/2022. FINDINGS: PA and lateral views at 1215 hours on 12/27/2022. Stable lung volumes since last year. Stable mild cardiomegaly. Other mediastinal contours are within normal limits. Chronic right lung base fibrothorax, chronic pleural effusion and pleural thickening as demonstrated on prior CT. Adjacent atelectasis, right lung base ventilation not significantly changed from October. No superimposed pneumothorax. Pulmonary vascularity however is increased in both lungs. No left pleural effusion. No other confluent opacity. Visualized tracheal air column is within normal limits. No acute osseous abnormality identified. Negative visible bowel gas. IMPRESSION: 1. Acute interstitial  opacity. Consider acute interstitial edema in this setting, viral/atypical respiratory infection also possible. 2. Underlying chronic right lung base fibrothorax / chronic pleural effusion. Stable ventilation there since October. 3. Mild cardiomegaly. Electronically Signed   By: Odessa Fleming M.D.   On: 12/28/2022 08:02   DG Abdomen 1 View  Result Date: 12/28/2022 CLINICAL DATA:  57 year old female with constipation. Dialysis patient. EXAM: ABDOMEN - 1 VIEW COMPARISON:  CT Abdomen and Pelvis 05/30/2022. FINDINGS: 2 spine views of the abdomen at 0740 hours. Diffuse calcified atherosclerosis in the abdomen and pelvis. Stable cholecystectomy clips. Non obstructed bowel gas pattern. Average volume of retained stool. No acute osseous abnormality identified. IMPRESSION: 1. Nonobstructed bowel-gas pattern with an average volume of retained stool. 2. Diffuse visceral calcified atherosclerosis. Electronically Signed   By: Odessa Fleming M.D.   On: 12/28/2022 07:59     The results of significant diagnostics from this hospitalization (including imaging, microbiology, ancillary and laboratory) are listed below for reference.     Microbiology: No results found for this or any previous visit (from the past 240 hour(s)).   Labs: BNP (last 3 results) Recent Labs    05/28/22 0118 01/01/23 2212  BNP 1,942.0* >4,500.0*   Basic Metabolic Panel: Recent Labs  Lab 12/28/22 0704 01/01/23 1415 01/02/23 0537 01/03/23 0508  NA 133* 138 137 135  K 3.6 4.3 4.4 3.7  CL 94* 97* 98 93*  CO2 23 26 26 28   GLUCOSE 180* 134* 118* 115*  BUN 43* 42* 46* 33*  CREATININE 6.83* 7.01* 7.63* 5.73*  CALCIUM 9.1 9.4 9.1 8.8*  MG  --   --   --  2.4   Liver Function Tests: Recent Labs  Lab 12/28/22 0704 01/01/23 1637 01/02/23 0537 01/03/23 0508  AST 19 151* 77* 47*  ALT 9 190* 153* 122*  ALKPHOS 118 166* 178* 167*  BILITOT 1.9* 2.5* 2.1* 1.8*  PROT 7.4 7.3 7.2 7.7  ALBUMIN 3.8 3.9 3.6 4.0   Recent Labs  Lab 12/28/22 0704   LIPASE 40   No results for input(s): "AMMONIA" in the last 168 hours. CBC: Recent Labs  Lab 12/28/22 0704 01/01/23 1415 01/01/23 2212 01/02/23 0835 01/03/23 0508  WBC 5.9 7.7 6.3 5.6 7.1  NEUTROABS  --   --  3.8  --   --   HGB 8.7* 8.4* 8.0* 8.0* 9.5*  HCT 28.1* 26.9* 25.7* 25.3* 30.3*  MCV 94.9 95.1 95.9 94.8 95.6  PLT 140* 141* 133* 137* 150   Cardiac Enzymes: No results for input(s): "CKTOTAL", "CKMB", "CKMBINDEX", "TROPONINI" in the last 168 hours. BNP: Invalid input(s): "POCBNP" CBG: Recent Labs  Lab 01/01/23 2126 01/02/23 0724 01/02/23 1346 01/02/23 1726 01/02/23 2103  GLUCAP 149* 96 109* 215* 102*   D-Dimer No results for input(s): "DDIMER" in the last 72 hours. Hgb A1c Recent Labs    01/01/23 1413  HGBA1C 5.3   Lipid Profile No results for input(s): "CHOL", "HDL", "LDLCALC", "TRIG", "CHOLHDL", "LDLDIRECT" in the last 72 hours. Thyroid function studies Recent Labs    01/01/23 2212  TSH 2.546   Anemia work up No results for input(s): "VITAMINB12", "FOLATE", "FERRITIN", "TIBC", "IRON", "RETICCTPCT" in the last 72 hours. Urinalysis    Component Value Date/Time   COLORURINE YELLOW (A) 01/02/2023 1352   APPEARANCEUR CLOUDY (A) 01/02/2023 1352   LABSPEC 1.011 01/02/2023 1352   PHURINE 8.0 01/02/2023 1352   GLUCOSEU NEGATIVE 01/02/2023 1352   HGBUR SMALL (A) 01/02/2023 1352   BILIRUBINUR NEGATIVE 01/02/2023 1352   BILIRUBINUR neg 06/09/2021 1128   KETONESUR NEGATIVE 01/02/2023 1352   PROTEINUR >=300 (A) 01/02/2023 1352   UROBILINOGEN 0.2 06/09/2021 1128   NITRITE NEGATIVE 01/02/2023 1352   LEUKOCYTESUR TRACE (A) 01/02/2023 1352   Sepsis Labs Recent Labs  Lab 01/01/23 1415 01/01/23 2212 01/02/23 0835 01/03/23 0508  WBC 7.7 6.3 5.6 7.1   Microbiology No results found for this or any previous visit (from the past 240 hour(s)).   Time coordinating discharge:  I have spent 35 minutes face to face with the patient and on the ward discussing  the patients care, assessment, plan and disposition with other care givers. >50% of the time was devoted counseling the patient about the risks and benefits of treatment/Discharge disposition and coordinating care.   SIGNED:   Dimple Nanas, MD  [facility] 01/03/2023, 11:23 AM   If 7PM-7AM, please contact night-coverage

## 2023-01-03 NOTE — Discharge Planning (Signed)
[facility]BL[facility] HEMODIALYSIS Outpatient Facility  [facility]  [address]  [place] [address] [phone]  Scheduled days: Tuesday Thursday and Saturday  Treatment time: 11:30am  Per Nephrology request, clinic was contacted, spoke with Gavin Pound RN about changing patient's dry weight to 90.0kg and to begin standing weights, rather than sitting weights in wheelchair.

## 2023-01-03 NOTE — TOC Transition Note (Signed)
Transition of Care [facility]) - CM/SW Discharge Note   Patient Details  Name: Cindy Osborne MRN: [id number] Date of Birth: Jul 31, 1966  [facility] CM/SW Contact:  Allena Katz, LCSW Phone Number: 01/03/2023, 11:33 AM   Clinical Narrative:   Pt has orders to discharge. Pt is active with [facility] for PT/OT. Cheryl with [facility] notified. Pt also has aide services through an agency in [place] that she private pays to assist her with. Pt is going to contact them to notify them of her discharge.           Patient Goals and CMS Choice      Discharge Placement                         Discharge Plan and Services Additional resources added to the After Visit Summary for                                       Social Determinants of Health (SDOH) Interventions SDOH Screenings   Food Insecurity: No Food Insecurity (01/02/2023)  Housing: Low Risk  (01/02/2023)  Transportation Needs: No Transportation Needs (01/02/2023)  Utilities: Not At Risk (01/02/2023)  Alcohol Screen: Low Risk  (11/08/2022)  Depression (PHQ2-9): Medium Risk (12/27/2022)  Financial Resource Strain: Low Risk  (11/08/2022)  Physical Activity: Inactive (11/08/2022)  Social Connections: Unknown (11/08/2022)  Stress: No Stress Concern Present (11/08/2022)  Tobacco Use: Low Risk  (01/02/2023)     Readmission Risk Interventions     No data to display

## 2023-01-03 NOTE — Evaluation (Signed)
Physical Therapy Evaluation Patient Details Name: Britney Beneke MRN: [id number] DOB: 28-Feb-1966 Today's Date: 01/03/2023  History of Present Illness  Patient is a 57 year old female with history of ESRD on HD (TTS), HTN, HLD, DM, stroke, s/p of bilateral BKA, PVD, PAF on Eliquis, dCHF, admitted with SOB.  Clinical Impression  Pt pleasant and eager to work with PT.  Pt able to assist with donning prosthetics but reports that since CVA her L hand has not allowed her to get them on well and enough and she does get assist with this (and other ADLs) daily from aide.  Pt generally did well with in home distances/mobility, she did endorse some fatigue with the session but O2 remained in the mid/high 90s on room air.  Pt has 24/7 assist available at home, will benefit from continued PT to address functional and activity tolerance limitations.       Recommendations for follow up therapy are one component of a multi-disciplinary discharge planning process, led by the attending physician.  Recommendations may be updated based on patient status, additional functional criteria and insurance authorization.  Follow Up Recommendations       Assistance Recommended at Discharge Frequent or constant Supervision/Assistance  Patient can return home with the following  A little help with walking and/or transfers;A little help with bathing/dressing/bathroom;Assistance with cooking/housework;Assist for transportation    Equipment Recommendations None recommended by PT  Recommendations for Other Services       Functional Status Assessment Patient has had a recent decline in their functional status and demonstrates the ability to make significant improvements in function in a reasonable and predictable amount of time.     Precautions / Restrictions Precautions Precautions: Fall Precaution Comments: Falls risk Restrictions Weight Bearing Restrictions: No Other Position/Activity Restrictions: b/l BKAs       Mobility  Bed Mobility Overal bed mobility: Modified Independent             General bed mobility comments: Pt moves well in bed w/o assist    Transfers Overall transfer level: Needs assistance Equipment used: Rolling walker (2 wheels) Transfers: Sit to/from Stand Sit to Stand: Min guard           General transfer comment: Assist with donning prosthetics, minimal cuing for hand placement and set up, pt able to rise from bed and recliner w/o phyiscal assist    Ambulation/Gait Ambulation/Gait assistance: Min guard Gait Distance (Feet): 45 Feet Assistive device: Rolling walker (2 wheels)         General Gait Details: 2 bouts of ambulation with FWW and b/l prostetics.  She was able to maintain a relatively consistent cadence that she reports is close to her baseline.  She did endorse some fatigue but O2 remained in the mid/high 90s on room air and she had very little actual SOB or DOE; reportsing L UE fatigue more than cardiovascular.  Stairs            Wheelchair Mobility    Modified Rankin (Stroke Patients Only)       Balance Overall balance assessment: Modified Independent                                           Pertinent Vitals/Pain Pain Assessment Pain Assessment: No/denies pain    Home Living Family/patient expects to be discharged to:: Private residence Living Arrangements: Alone;Other (Comment) Available Help at Discharge: Family;Available 24  hours/day Type of Home: House Home Access: Ramped entrance       Home Layout: One level Home Equipment: Agricultural consultant (2 wheels);Cane - single point;Tub bench;Wheelchair - manual;Grab bars - tub/shower;BSC/3in1;Adaptive equipment;Hospital bed;Other (comment) (Hoyer lift, but does not use) Additional Comments: Bariatric BSC    Prior Function Prior Level of Function : Needs assist       Physical Assist : Mobility (physical) Mobility (physical): Transfers;Gait   Mobility  Comments: Currently using RW, bilateral prosthetics, occasionally uses manual wc if cooking. ADLs Comments: Pt reports decreased standing balance, and use of LUE since CVA. Can perform ADLs with MOD I except bathing (aide assists). If someone helps don prosthetics, she can dress LB from a seated postion.     Hand Dominance   Dominant Hand: Right    Extremity/Trunk Assessment   Upper Extremity Assessment Upper Extremity Assessment: LUE deficits/detail;Defer to OT evaluation    Lower Extremity Assessment Lower Extremity Assessment: Generalized weakness;Overall WFL for tasks assessed (b/l BKA, functional)       Communication   Communication: No difficulties  Cognition Arousal/Alertness: Awake/alert Behavior During Therapy: WFL for tasks assessed/performed Overall Cognitive Status: Within Functional Limits for tasks assessed                                          General Comments General comments (skin integrity, edema, etc.): Pt pleasant and interactive, showed good effort with PT session.  Mobilty near baseline, still having some fatigue, did not require supplemental O2 during in-home distance ambulation efforts    Exercises     Assessment/Plan    PT Assessment Patient needs continued PT services  PT Problem List Decreased strength;Decreased range of motion;Decreased activity tolerance;Decreased balance;Decreased mobility;Decreased knowledge of use of DME;Decreased safety awareness;Cardiopulmonary status limiting activity       PT Treatment Interventions Gait training;Functional mobility training;Therapeutic activities;Therapeutic exercise;Balance training;Neuromuscular re-education;Patient/family education    PT Goals (Current goals can be found in the Care Plan section)  Acute Rehab PT Goals Patient Stated Goal: get breathing better PT Goal Formulation: With patient Time For Goal Achievement: 01/16/23 Potential to Achieve Goals: Fair    Frequency  Min 3X/week     Co-evaluation               AM-PAC PT "6 Clicks" Mobility  Outcome Measure Help needed turning from your back to your side while in a flat bed without using bedrails?: None Help needed moving from lying on your back to sitting on the side of a flat bed without using bedrails?: None Help needed moving to and from a bed to a chair (including a wheelchair)?: A Little Help needed standing up from a chair using your arms (e.g., wheelchair or bedside chair)?: None Help needed to walk in hospital room?: A Little Help needed climbing 3-5 steps with a railing? : A Lot 6 Click Score: 20    End of Session Equipment Utilized During Treatment: Gait belt Activity Tolerance: Patient tolerated treatment well;Patient limited by fatigue Patient left: with chair alarm set;with call bell/phone within reach Nurse Communication: Mobility status PT Visit Diagnosis: Muscle weakness (generalized) (M62.81);Difficulty in walking, not elsewhere classified (R26.2)    Time: 1610-9604 PT Time Calculation (min) (ACUTE ONLY): 35 min   Charges:   PT Evaluation $PT Eval Low Complexity: 1 Low PT Treatments $Gait Training: 8-22 mins $Therapeutic Activity: 8-22 mins  Malachi Pro, DPT 01/03/2023, 10:42 AM

## 2023-01-03 NOTE — Progress Notes (Signed)
[facility]  ROUNDING NOTE   Subjective:   Cindy Osborne is a 57 y.o. female with past medical history of CVA, hypertension, hyperlipidemia, atrial fib on Eliquis, and end stage renal disease on hemodialysis. Patient presents to the ED with complaints of shortness of breath and progressive fatigue. She has been admitted for Acute respiratory distress [R06.03] Fatigue [R53.83] Acute respiratory failure with hypoxia (HCC) [J96.01]  Patient is known to our practice and received outpatietn dialysis treatments at [facility] on a TTS schedule, supervised by Dr Wynelle Link.   Patient seen sitting up in chair, alert and oriented States she feels well today Able to ambulate to bathroom and in hallway with therapy Appetite remains poor  Objective:  Vital signs in last 24 hours:  Temp:  [97.5 F (36.4 C)-98.6 F (37 C)] 97.8 F (36.6 C) (05/22 1154) Pulse Rate:  [63-70] 70 (05/22 1154) Resp:  [16-18] 18 (05/22 1154) BP: (109-128)/(67-96) 128/82 (05/22 1154) SpO2:  [95 %-100 %] 97 % (05/22 1154) Weight:  [95.5 kg] 95.5 kg (05/22 0500)  Weight change: -2.8 kg Filed Weights   01/02/23 0805 01/02/23 1236 01/03/23 0500  Weight: 92.4 kg 90.2 kg 95.5 kg    Intake/Output: I/O last 3 completed shifts: In: 720 [P.O.:717; I.V.:3] Out: 2150 [Urine:150; Other:2000]   Intake/Output this shift:  Total I/O In: 220 [P.O.:220] Out: -   Physical Exam: General: NAD  Head: Normocephalic, atraumatic. Moist oral mucosal membranes  Eyes: Anicteric  Lungs:  Clear to auscultation, normal effort, room air  Heart: Regular rate and rhythm  Abdomen:  Soft, nontender,   Extremities: No peripheral edema.  Neurologic: Alert and oriented, moving all four extremities  Skin: No lesions  Access: Left upper aVF    Basic Metabolic Panel: Recent Labs  Lab 12/28/22 0704 01/01/23 1415 01/02/23 0537 01/03/23 0508  NA 133* 138 137 135  K 3.6 4.3 4.4 3.7  CL 94* 97* 98 93*  CO2 23 26 26 28    GLUCOSE 180* 134* 118* 115*  BUN 43* 42* 46* 33*  CREATININE 6.83* 7.01* 7.63* 5.73*  CALCIUM 9.1 9.4 9.1 8.8*  MG  --   --   --  2.4     Liver Function Tests: Recent Labs  Lab 12/28/22 0704 01/01/23 1637 01/02/23 0537 01/03/23 0508  AST 19 151* 77* 47*  ALT 9 190* 153* 122*  ALKPHOS 118 166* 178* 167*  BILITOT 1.9* 2.5* 2.1* 1.8*  PROT 7.4 7.3 7.2 7.7  ALBUMIN 3.8 3.9 3.6 4.0    Recent Labs  Lab 12/28/22 0704  LIPASE 40    No results for input(s): "AMMONIA" in the last 168 hours.  CBC: Recent Labs  Lab 12/28/22 0704 01/01/23 1415 01/01/23 2212 01/02/23 0835 01/03/23 0508  WBC 5.9 7.7 6.3 5.6 7.1  NEUTROABS  --   --  3.8  --   --   HGB 8.7* 8.4* 8.0* 8.0* 9.5*  HCT 28.1* 26.9* 25.7* 25.3* 30.3*  MCV 94.9 95.1 95.9 94.8 95.6  PLT 140* 141* 133* 137* 150     Cardiac Enzymes: No results for input(s): "CKTOTAL", "CKMB", "CKMBINDEX", "TROPONINI" in the last 168 hours.  BNP: Invalid input(s): "POCBNP"  CBG: Recent Labs  Lab 01/02/23 0724 01/02/23 1346 01/02/23 1726 01/02/23 2103 01/03/23 1156  GLUCAP 96 109* 215* 102* 150*     Microbiology: Results for orders placed or performed during the hospital encounter of 05/30/22  Resp Panel by RT-PCR (Flu A&B, Covid) Anterior Nasal Swab  Status: None   Collection Time: 05/30/22  8:13 AM   Specimen: Anterior Nasal Swab  Result Value Ref Range Status   SARS Coronavirus 2 by RT PCR NEGATIVE NEGATIVE Final    Comment: (NOTE) SARS-CoV-2 target nucleic acids are NOT DETECTED.  The SARS-CoV-2 RNA is generally detectable in upper respiratory specimens during the acute phase of infection. The lowest concentration of SARS-CoV-2 viral copies this assay can detect is 138 copies/mL. A negative result does not preclude SARS-Cov-2 infection and should not be used as the sole basis for treatment or other patient management decisions. A negative result may occur with  improper specimen collection/handling,  submission of specimen other than nasopharyngeal swab, presence of viral mutation(s) within the areas targeted by this assay, and inadequate number of viral copies(<138 copies/mL). A negative result must be combined with clinical observations, patient history, and epidemiological information. The expected result is Negative.  Fact Sheet for Patients:  BloggerCourse.com  Fact Sheet for Healthcare Providers:  SeriousBroker.it  This test is no t yet approved or cleared by the [place] FDA and  has been authorized for detection and/or diagnosis of SARS-CoV-2 by FDA under an Emergency Use Authorization (EUA). This EUA will remain  in effect (meaning this test can be used) for the duration of the COVID-19 declaration under Section 564(b)(1) of the Act, 21 U.S.C.section 360bbb-3(b)(1), unless the authorization is terminated  or revoked sooner.       Influenza A by PCR NEGATIVE NEGATIVE Final   Influenza B by PCR NEGATIVE NEGATIVE Final    Comment: (NOTE) The Xpert Xpress SARS-CoV-2/FLU/RSV plus assay is intended as an aid in the diagnosis of influenza from Nasopharyngeal swab specimens and should not be used as a sole basis for treatment. Nasal washings and aspirates are unacceptable for Xpert Xpress SARS-CoV-2/FLU/RSV testing.  Fact Sheet for Patients: BloggerCourse.com  Fact Sheet for Healthcare Providers: SeriousBroker.it  This test is not yet approved or cleared by the [place] FDA and has been authorized for detection and/or diagnosis of SARS-CoV-2 by FDA under an Emergency Use Authorization (EUA). This EUA will remain in effect (meaning this test can be used) for the duration of the COVID-19 declaration under Section 564(b)(1) of the Act, 21 U.S.C. section 360bbb-3(b)(1), unless the authorization is terminated or revoked.  Performed at [facility], [address] [place], [place] [address]   Blood Culture (routine x 2)     Status: None   Collection Time: 05/30/22  8:13 AM   Specimen: BLOOD  Result Value Ref Range Status   Specimen Description BLOOD BLOOD RIGHT ARM  Final   Special Requests   Final    BOTTLES DRAWN AEROBIC AND ANAEROBIC Blood Culture results may not be optimal due to an excessive volume of blood received in culture bottles   Culture   Final    NO GROWTH 5 DAYS Performed at [facility], [address] [place], [place] [address]    Report Status 06/04/2022 FINAL  Final  Blood Culture (routine x 2)     Status: None   Collection Time: 05/30/22  8:13 AM   Specimen: BLOOD  Result Value Ref Range Status   Specimen Description BLOOD RIGHT ANTECUBITAL  Final   Special Requests   Final    BOTTLES DRAWN AEROBIC AND ANAEROBIC Blood Culture results may not be optimal due to an excessive volume of blood received in culture bottles   Culture   Final    NO GROWTH 5 DAYS Performed  at [facility], [address] [place], [place] [address]    Report Status 06/04/2022 FINAL  Final  Body fluid culture w Gram Stain     Status: None   Collection Time: 05/31/22 11:50 AM   Specimen: PATH Cytology Pleural fluid  Result Value Ref Range Status   Specimen Description   Final    PLEURAL Performed at [facility], [address] [place], [place] [address]    Special Requests   Final    NONE Performed at [facility], [address], [place], [place] [address]    Gram Stain NO WBC SEEN NO ORGANISMS SEEN   Final   Culture   Final    NO GROWTH 3 DAYS Performed at [facility], [address], [place], [place] [address]    Report Status 06/03/2022 FINAL  Final    Coagulation Studies: No results for input(s): "LABPROT", "INR" in the last 72 hours.  Urinalysis: Recent Labs    01/02/23 1352  COLORURINE YELLOW*  LABSPEC 1.011  PHURINE 8.0  GLUCOSEU NEGATIVE  HGBUR SMALL*   BILIRUBINUR NEGATIVE  KETONESUR NEGATIVE  PROTEINUR >=300*  NITRITE NEGATIVE  LEUKOCYTESUR TRACE*       Imaging: US Abdomen Limited RUQ (LIVER/GB)  Result Date: 01/02/2023 CLINICAL DATA:  Transaminitis.  History of cholecystectomy. EXAM: ULTRASOUND ABDOMEN LIMITED RIGHT UPPER QUADRANT COMPARISON:  CT and MRCP 05/30/2022 FINDINGS: Gallbladder: Surgically absent. Common bile duct: Diameter: 8 mm.  No intrahepatic biliary dilation. Liver: No focal lesion identified. Question subtle nodularity of the hepatic contour. Within normal limits in parenchymal echogenicity. Portal vein is patent on color Doppler imaging with normal direction of blood flow towards the liver. Other: Incidental note of right pleural effusion. Trace perihepatic fluid. IMPRESSION: 1. Question subtle nodularity of the hepatic contour which can be seen with cirrhosis. 2. Trace perihepatic fluid. 3. Incidental note of right pleural effusion. Electronically Signed   By: Minerva Fester M.D.   On: 01/02/2023 01:13     Medications:     apixaban  2.5 mg Oral BID   Chlorhexidine Gluconate Cloth  6 each Topical Q0600   insulin aspart  0-6 Units Subcutaneous TID WC   metoprolol succinate  25 mg Oral Daily   midodrine  10 mg Oral TID WC   sodium chloride flush  3 mL Intravenous Q12H   acetaminophen **OR** acetaminophen, guaiFENesin, hydrALAZINE, hydrOXYzine, ipratropium-albuterol, metoprolol tartrate, ondansetron (ZOFRAN) IV, promethazine, senna-docusate, simethicone, traZODone  Assessment/ Plan:  Cindy Osborne is a 57 y.o.  female with past medical history of CVA, hypertension, hyperlipidemia, atrial fib on Eliquis, and end stage renal disease on hemodialysis. Patient presents to the ED with complaints of shortness of breath and progressive fatigue. She has been admitted for Acute respiratory distress [R06.03] Fatigue [R53.83] Acute respiratory failure with hypoxia (HCC) [J96.01]   End-stage renal disease on  hemodialysis.  Last treatment completed on Saturday.  Patient received dialysis yesterday, UF 2 L achieved.  Will establish new dry weight at 90.0 kg.  Patient was able to stand with prosthetics to obtain post weight treatment.  Will notify outpatient clinic of new dry weight and ability to get standing weight.  Patient cleared to discharge from renal stance and continue outpatient dialysis tomorrow at a site clinic.  2.  Acute respiratory failure, Chest x-ray shows a stable small right pleural effusion with right basilar atelectasis.  Weaned to room air  3. Anemia of chronic kidney disease Lab Results  Component Value  Date   HGB 9.5 (L) 01/03/2023    Patient receives Mircera at outpatient clinic.  Hemoglobin acceptable.  4. Secondary Hyperparathyroidism: with outpatient labs: PTH 2564, phosphorus 5.6, calcium 9.0 on 12/19/22.   Lab Results  Component Value Date   CALCIUM 8.8 (L) 01/03/2023   CAION 0.90 (L) 05/27/2022   PHOS 3.3 05/31/2022    Patient prescribed Velphoro with meals outpatient.  5.  Diabetes mellitus type II with chronic kidney disease/renal manifestations: insulin dependent. Home regimen includes Humalog and Lantus. Most recent hemoglobin A1c is 5.3 on 01/01/23.     LOS: 1 Terrick Allred 5/22/20243:36 PM

## 2023-01-03 NOTE — Plan of Care (Signed)

## 2023-01-03 NOTE — Progress Notes (Signed)
Occupational Therapy Evaluation Patient Details Name: Cindy Osborne MRN: [id number] DOB: 05/09/1966 Today's Date: 01/03/2023   History of Present Illness Patient is a 57 year old female with history of ESRD on HD (TTS), HTN, HLD, DM, stroke, s/p of bilateral BKA, PVD, PAF on Eliquis, dCHF, admitted with SOB.   Clinical Impression   Pt received in recliner with NP present. Agreeable to OT evaluation and good efforts throughout. During OT eval, pt performing with deficits consistent with prior CVA (L inattention, decreased FMC/GMC of LUE, impaired bimanual task performance), receives HH OT/PT and has 24/7 supervision at home. Patient currently functioning at functional baseline. No further OT needs identified, OT to complete orders.      Recommendations for follow up therapy are one component of a multi-disciplinary discharge planning process, led by the attending physician.  Recommendations may be updated based on patient status, additional functional criteria and insurance authorization.   Assistance Recommended at Discharge Frequent or constant Supervision/Assistance  Patient can return home with the following A little help with walking and/or transfers;A little help with bathing/dressing/bathroom;Help with stairs or ramp for entrance;Assistance with cooking/housework;Direct supervision/assist for financial management;Direct supervision/assist for medications management    Functional Status Assessment  Patient has not had a recent decline in their functional status        Precautions / Restrictions Precautions Precautions: Fall Precaution Comments: Falls risk Restrictions Weight Bearing Restrictions: No Other Position/Activity Restrictions: b/l BKAs      Mobility Bed Mobility                    Transfers Overall transfer level: Needs assistance Equipment used: Rolling walker (2 wheels) Transfers: Sit to/from Stand Sit to Stand: Min guard           General  transfer comment: Min cuing for hand placement, attempts x2 to stand      Balance Overall balance assessment: Needs assistance Sitting-balance support: Feet supported Sitting balance-Leahy Scale: Good     Standing balance support: Bilateral upper extremity supported Standing balance-Leahy Scale: Fair                             ADL either performed or assessed with clinical judgement   ADL Overall ADL's : Needs assistance/impaired     Grooming: Wash/dry hands;Wash/dry face;Oral care;Set up;Standing;Minimal assistance Grooming Details (indicate cue type and reason): Pt performs grooming tasks standing at sink, leaning on forearms until cued for upright posture. Unable to unscrew toothpaste cap with current LUE deficits in [facility], with cues pt able to incorporate LUE as passive stabilizer to use RUE to unscrew cap.                             Functional mobility during ADLs: Min guard;Rolling walker (2 wheels) General ADL Comments: Overall, pt with decreased dynamic balance during standing ADL tasks. Requiring overall min guard and increased attempts to stand from recliner, functional mobility in room with CGA and RW. Performs self-care standing at sink leaning on forearms due to learned compensatory strategies (pt performs ADLs in this postion at home). Anticipate pt will require setup for UB ADLs, LB ADLs minA to don prosthetics but setup/lateral leans once prosthetics are donned. Generalized weakness requires closer physical assist for functional transfers - min guard for RW mgmt required. L inattention/learned nonuse of LUE requires cuing to perform bimanual tasks. Anticipate this is close to pt's baseline per her  report.     Vision Baseline Vision/History: 1 Wears glasses Ability to See in Adequate Light: 0 Adequate Patient Visual Report:  ("Floaters" in R eye, has discussed with eye doctor)              Pertinent Vitals/Pain Pain Assessment Pain  Assessment: No/denies pain     Hand Dominance Right   Extremity/Trunk Assessment Upper Extremity Assessment Upper Extremity Assessment: LUE deficits/detail;Generalized weakness LUE Deficits / Details: Prior CVA, impaired GMC/FMC. Impaired digit opposition and unable to use L hand to unscrew toothpaste cap. RUE 4/5 strength. LUE with mild flexor synergy, L innattention, overall gross motor WFL, distal impairments impact functional task participation for dexterity/FMC. LUE Coordination: decreased fine motor;decreased gross motor   Lower Extremity Assessment Lower Extremity Assessment: Generalized weakness       Communication Communication Communication: No difficulties   Cognition Arousal/Alertness: Awake/alert Behavior During Therapy: WFL for tasks assessed/performed Overall Cognitive Status: Within Functional Limits for tasks assessed                                       General Comments  Fatigue/generalized weakness present, feel that pt is at her functional baseline            Home Living Family/patient expects to be discharged to:: Private residence Living Arrangements: Alone;Other (Comment) Available Help at Discharge: Family;Available 24 hours/day Type of Home: House Home Access: Ramped entrance     Home Layout: One level     Bathroom Shower/Tub: Chief Strategy Officer: Standard Bathroom Accessibility: Yes   Home Equipment: Agricultural consultant (2 wheels);Cane - single point;Tub bench;Wheelchair - manual;Grab bars - tub/shower;BSC/3in1;Adaptive equipment;Hospital bed;Other (comment) (Hoyer lift, but does not use) Adaptive Equipment: Long-handled sponge;Reacher Additional Comments: Bariatric BSC      Prior Functioning/Environment Prior Level of Function : Needs assist       Physical Assist : Mobility (physical) Mobility (physical): Transfers;Gait   Mobility Comments: Currently using RW, bilateral prosthetics, occasionally uses  manual wc if cooking. ADLs Comments: Pt reports decreased standing balance, and use of LUE since CVA. Can perform ADLs with MOD I except bathing (aide assists). If someone helps don prosthetics, she can dress LB from a seated postion.         AM-PAC OT "6 Clicks" Daily Activity     Outcome Measure Help from another person eating meals?: None Help from another person taking care of personal grooming?: None Help from another person toileting, which includes using toliet, bedpan, or urinal?: A Little Help from another person bathing (including washing, rinsing, drying)?: A Little Help from another person to put on and taking off regular upper body clothing?: None Help from another person to put on and taking off regular lower body clothing?: A Little 6 Click Score: 21   End of Session Equipment Utilized During Treatment: Gait belt;Rolling walker (2 wheels)  Activity Tolerance: Patient tolerated treatment well Patient left: in chair;with call bell/phone within reach;with chair alarm set;with nursing/sitter in room                   Time: 1007-1030 OT Time Calculation (min): 23 min Charges:  OT General Charges $OT Visit: 1 Visit OT Evaluation $OT Eval Low Complexity: 1 Low OT Treatments $Self Care/Home Management : 8-22 mins  Zoila Ditullio L. Arkel Cartwright, OTR/L  01/03/23, 11:03 AM

## 2023-01-04 ENCOUNTER — Telehealth: Payer: Self-pay | Admitting: *Deleted

## 2023-01-04 NOTE — Transitions of Care (Post Inpatient/ED Visit) (Signed)
   01/04/2023  Name: Tonnisha Nattress MRN: [id number] DOB: 1966-02-06  Today's TOC FU Call Status: Today's TOC FU Call Status:: Unsuccessul Call (1st Attempt) Unsuccessful Call (1st Attempt) Date: 01/04/23  Attempted to reach the patient regarding the most recent Inpatient/ED visit.  Follow Up Plan: Additional outreach attempts will be made to reach the patient to complete the Transitions of Care (Post Inpatient/ED visit) call.   Gean Maidens BSN RN [facility] [phone]

## 2023-01-05 ENCOUNTER — Other Ambulatory Visit: Payer: Self-pay | Admitting: Internal Medicine

## 2023-01-05 ENCOUNTER — Ambulatory Visit: Payer: Self-pay

## 2023-01-05 ENCOUNTER — Ambulatory Visit: Payer: Federal, State, Local not specified - PPO | Admitting: Internal Medicine

## 2023-01-05 NOTE — Telephone Encounter (Signed)
Pt went to the ER on 01/01/23 and states that she was given tramadol and is wanting to know if Dr. Judithann Graves will prescribe her some. Per pt it relaxed her mind and body. Please advise.   Chief Complaint: Phantom pains both legs. States she was given Tramadol in the hospital "and it really helped my pain and I could rest." Asking for Tramadol to be called in. Symptoms: Pain both legs 9/10 Frequency:  Pertinent Negatives: Patient denies  Disposition: [] ED /[] Urgent Care (no appt availability in office) / [] Appointment(In office/virtual)/ []  [facility]/ [] Home Care/ [] Refused Recommended Disposition /[] [facility]/ [x]  Follow-up with PCP Additional Notes: Please advise pt.  Answer Assessment - Initial Assessment Questions 1. NAME of MEDICINE: "What medicine(s) are you calling about?"     Tramadol 2. QUESTION: "What is your question?" (e.g., double dose of medicine, side effect)     Can she call medication in 3. PRESCRIBER: "Who prescribed the medicine?" Reason: if prescribed by specialist, call should be referred to that group.     Had it in the hospital 4. SYMPTOMS: "Do you have any symptoms?" If Yes, ask: "What symptoms are you having?"  "How bad are the symptoms (e.g., mild, moderate, severe)     Phantom pains both legs 5. PREGNANCY:  "Is there any chance that you are pregnant?" "When was your last menstrual period?"     No  Protocols used: Medication Question Call-A-AH

## 2023-01-05 NOTE — Telephone Encounter (Signed)
Called patient and left VM informing of this. Asked her to call back and schedule an appointment to be seen and discuss.  - Cindy Osborne

## 2023-01-10 ENCOUNTER — Encounter: Payer: Medicare Other | Admitting: Physical Therapy

## 2023-01-10 ENCOUNTER — Telehealth: Payer: Self-pay | Admitting: *Deleted

## 2023-01-10 NOTE — Transitions of Care (Post Inpatient/ED Visit) (Signed)
   01/10/2023  Name: Tenille Armenteros MRN: [id number] DOB: 03-17-1966  Today's TOC FU Call Status: Today's TOC FU Call Status:: Unsuccessful Call (2nd Attempt) Unsuccessful Call (2nd Attempt) Date: 01/10/23  Attempted to reach the patient regarding the most recent Inpatient/ED visit.  Follow Up Plan: Additional outreach attempts will be made to reach the patient to complete the Transitions of Care (Post Inpatient/ED visit) call.   Gean Maidens BSN RN [facility] [phone]

## 2023-01-11 ENCOUNTER — Other Ambulatory Visit: Payer: Self-pay

## 2023-01-11 ENCOUNTER — Emergency Department
Admission: EM | Admit: 2023-01-11 | Discharge: 2023-01-11 | Disposition: A | Discharge status: Home / Self Care | Payer: Medicare Other | Attending: Emergency Medicine | Admitting: Emergency Medicine

## 2023-01-11 ENCOUNTER — Emergency Department: Payer: Medicare Other

## 2023-01-11 ENCOUNTER — Telehealth: Payer: Self-pay | Admitting: *Deleted

## 2023-01-11 DIAGNOSIS — K5900 Constipation, unspecified: Secondary | ICD-10-CM | POA: Diagnosis not present

## 2023-01-11 DIAGNOSIS — Z79899 Other long term (current) drug therapy: Secondary | ICD-10-CM | POA: Insufficient documentation

## 2023-01-11 DIAGNOSIS — I10 Essential (primary) hypertension: Secondary | ICD-10-CM | POA: Insufficient documentation

## 2023-01-11 DIAGNOSIS — Z8673 Personal history of transient ischemic attack (TIA), and cerebral infarction without residual deficits: Secondary | ICD-10-CM | POA: Insufficient documentation

## 2023-01-11 DIAGNOSIS — R1084 Generalized abdominal pain: Secondary | ICD-10-CM | POA: Diagnosis present

## 2023-01-11 DIAGNOSIS — E119 Type 2 diabetes mellitus without complications: Secondary | ICD-10-CM | POA: Insufficient documentation

## 2023-01-11 LAB — CBC WITH DIFFERENTIAL/PLATELET
Abs Immature Granulocytes: 0.04 10*3/uL (ref 0.00–0.07)
Basophils Absolute: 0 10*3/uL (ref 0.0–0.1)
Basophils Relative: 0 %
Eosinophils Absolute: 0.1 10*3/uL (ref 0.0–0.5)
Eosinophils Relative: 1 %
HCT: 32.7 % — ABNORMAL LOW (ref 36.0–46.0)
Hemoglobin: 9.9 g/dL — ABNORMAL LOW (ref 12.0–15.0)
Immature Granulocytes: 1 %
Lymphocytes Relative: 29 %
Lymphs Abs: 2.2 10*3/uL (ref 0.7–4.0)
MCH: 29.8 pg (ref 26.0–34.0)
MCHC: 30.3 g/dL (ref 30.0–36.0)
MCV: 98.5 fL (ref 80.0–100.0)
Monocytes Absolute: 0.5 10*3/uL (ref 0.1–1.0)
Monocytes Relative: 7 %
Neutro Abs: 4.5 10*3/uL (ref 1.7–7.7)
Neutrophils Relative %: 62 %
Platelets: 134 10*3/uL — ABNORMAL LOW (ref 150–400)
RBC: 3.32 MIL/uL — ABNORMAL LOW (ref 3.87–5.11)
RDW: 16.7 % — ABNORMAL HIGH (ref 11.5–15.5)
WBC: 7.3 10*3/uL (ref 4.0–10.5)
nRBC: 0 % (ref 0.0–0.2)

## 2023-01-11 LAB — COMPREHENSIVE METABOLIC PANEL
ALT: 47 U/L — ABNORMAL HIGH (ref 0–44)
AST: 43 U/L — ABNORMAL HIGH (ref 15–41)
Albumin: 4.1 g/dL (ref 3.5–5.0)
Alkaline Phosphatase: 186 U/L — ABNORMAL HIGH (ref 38–126)
Anion gap: 14 (ref 5–15)
BUN: 40 mg/dL — ABNORMAL HIGH (ref 6–20)
CO2: 25 mmol/L (ref 22–32)
Calcium: 8.8 mg/dL — ABNORMAL LOW (ref 8.9–10.3)
Chloride: 93 mmol/L — ABNORMAL LOW (ref 98–111)
Creatinine, Ser: 6.94 mg/dL — ABNORMAL HIGH (ref 0.44–1.00)
GFR, Estimated: 6 mL/min — ABNORMAL LOW (ref >60)
Glucose, Bld: 187 mg/dL — ABNORMAL HIGH (ref 70–99)
Potassium: 4 mmol/L (ref 3.5–5.1)
Sodium: 132 mmol/L — ABNORMAL LOW (ref 135–145)
Total Bilirubin: 3 mg/dL — ABNORMAL HIGH (ref 0.3–1.2)
Total Protein: 8 g/dL (ref 6.5–8.1)

## 2023-01-11 LAB — LIPASE, BLOOD: Lipase: 44 U/L (ref 11–51)

## 2023-01-11 MED ORDER — TRAMADOL HCL 50 MG PO TABS
50.0000 mg | ORAL_TABLET | Freq: Once | ORAL | Status: AC
  Administered 2023-01-11: 50 mg via ORAL
  Filled 2023-01-11: qty 1

## 2023-01-11 MED ORDER — MAGNESIUM CITRATE PO SOLN
1.0000 | Freq: Once | ORAL | Status: AC
  Administered 2023-01-11: 1 via ORAL
  Filled 2023-01-11: qty 296

## 2023-01-11 MED ORDER — LACTULOSE 10 GM/15ML PO SOLN: 20.0000 g | Freq: Every day | ORAL | 0 refills | Status: DC | PRN

## 2023-01-11 MED ORDER — DOCUSATE SODIUM 50 MG/5ML PO LIQD
100.0000 mg | Freq: Once | ORAL | Status: AC
  Administered 2023-01-11: 100 mg
  Filled 2023-01-11: qty 10

## 2023-01-11 MED ORDER — LACTULOSE 10 GM/15ML PO SOLN
30.0000 g | Freq: Once | ORAL | Status: AC
  Administered 2023-01-11: 30 g via ORAL
  Filled 2023-01-11: qty 60

## 2023-01-11 NOTE — ED Notes (Signed)
Enema performed @this  time. Pt tolerated well but unable to leave fluids in for a long period of time, MD notified

## 2023-01-11 NOTE — Discharge Instructions (Addendum)
1.  Take Lactulose as needed for bowel movements. 2.  To regulate daily bowel movements, I recommend the following over-the-counter medications: MiraLAX Stool softener such as Colace twice daily Increase fiber 3.  Return to the ER for worsening symptoms, persistent vomiting, difficulty breathing or other concerns.

## 2023-01-11 NOTE — ED Provider Notes (Signed)
[facility] Provider Note    Event Date/Time   First MD Initiated Contact with Patient 01/11/23 0124     (approximate)   History   Constipation   HPI  Cindy Osborne is a 57 y.o. female brought to the ED via EMS from home with a chief complaint of constipation.  Patient usually has bowel movements every 2 days or so but has not had a bowel movement x 5 days.  History of ESRD on HD T/TH/SAT.  Patient did receive dialysis on Tuesday.  States she receives iron injections and that is the reason why she is constipated.  Reports generalized abdominal discomfort with associated nausea without vomiting.  States she is feeling short of breath when laying flat.  Denies fever/chills, chest pain, diarrhea.  Makes little urine.   Past Medical History   Past Medical History:  Diagnosis Date   Acute ischemic right MCA stroke (HCC) 03/25/2022   Amputated toe (HCC) 01/31/2012   Right second toe distal phalanx Right great toe   Diabetes mellitus without complication (HCC)    History of diabetic ulcer of foot 04/01/2014   Hyperlipidemia    Hypertension    Stroke (cerebrum) (HCC) 03/21/2022   Stroke (HCC) 01/21/2022     Active Problem List   Patient Active Problem List   Diagnosis Date Noted   Acute respiratory distress 01/02/2023   Fatigue 01/01/2023   Type II diabetes mellitus with renal manifestations (HCC) 05/30/2022   HLD (hyperlipidemia) 05/30/2022   Obesity with body mass index (BMI) of 30.0 to 39.9 05/30/2022   Abnormal LFTs 05/30/2022   Chronic diastolic CHF (congestive heart failure) (HCC) 05/30/2022   Thyroid nodule 05/30/2022   Pleural effusion on right 05/30/2022   PAF (paroxysmal atrial fibrillation) (HCC) 05/30/2022   Intractable nausea and vomiting 05/27/2022   Weakness of left upper extremity 05/11/2022   History of stroke with current residual effects 05/08/2022   Mood disorder (HCC) 05/08/2022   Pressure injury of skin 04/05/2022   Acute  right arterial ischemic stroke, MCA (middle cerebral artery) (HCC) 01/21/2022   Combined forms of age-related cataract of both eyes 06/30/2021   Diabetic macular edema (HCC) 06/30/2021   Encounter for insertion of mirena IUD 03/15/2021   Panic disorder 03/03/2021   Acquired absence of left leg below knee (HCC) 10/26/2020   PVD (peripheral vascular disease) (HCC) 07/28/2020   Uncontrolled type 2 diabetes mellitus with diabetic peripheral angiopathy without gangrene 02/26/2020   Acquired absence of right leg below knee (HCC) 07/28/2019   Diabetic retinopathy (HCC) 11/23/2016   Slow transit constipation 09/24/2015   Environmental and seasonal allergies 07/09/2015   Anemia in ESRD (end-stage renal disease) (HCC) 07/09/2015   ESRD (end stage renal disease) on dialysis (HCC) 07/09/2015   Type II diabetes mellitus with complication (HCC) 07/09/2015   Herpes simplex infection 07/09/2015   Hyperlipidemia associated with type 2 diabetes mellitus (HCC) 07/09/2015   Heart & renal disease, hypertensive, with heart failure (HCC) 07/09/2015   Avitaminosis D 07/09/2015   Obstructive apnea 12/15/2014   Essential hypertension 09/11/2013     Past Surgical History   Past Surgical History:  Procedure Laterality Date   angioplasty politeal Left 07/2020   avg for dialysis     CHOLECYSTECTOMY     COLONOSCOPY  08/15/2011   cleared for 10 yrs   IR CT HEAD LTD  01/21/2022   IR CT HEAD LTD  03/21/2022   IR PERCUTANEOUS ART THROMBECTOMY/INFUSION INTRACRANIAL INC DIAG ANGIO  01/21/2022  IR PERCUTANEOUS ART THROMBECTOMY/INFUSION INTRACRANIAL INC DIAG ANGIO  03/21/2022   IR US GUIDE VASC ACCESS RIGHT  01/21/2022   IR US GUIDE VASC ACCESS RIGHT  03/21/2022   LEG AMPUTATION BELOW KNEE Right 04/2019   LEG AMPUTATION BELOW KNEE Left 09/08/2020   RADIOLOGY WITH ANESTHESIA N/A 01/21/2022   Procedure: IR WITH ANESTHESIA;  Surgeon: Radiologist, Medication, MD;  Location: [facility] OR;  Service: Radiology;  Laterality: N/A;    RADIOLOGY WITH ANESTHESIA N/A 03/21/2022   Procedure: IR WITH ANESTHESIA;  Surgeon: Radiologist, Medication, MD;  Location: [facility] OR;  Service: Radiology;  Laterality: N/A;   TOE AMPUTATION Right 10/15/2018     Home Medications   Prior to Admission medications   Medication Sig Start Date End Date Taking? Authorizing Provider  lactulose (CHRONULAC) 10 GM/15ML solution Take 30 mLs (20 g total) by mouth daily as needed for mild constipation. 01/11/23  Yes Irean Hong, MD  apixaban (ELIQUIS) 2.5 MG TABS tablet Take 1 tablet (2.5 mg total) by mouth 2 (two) times daily. 11/27/22   Penumalli, Glenford Bayley, MD  Doxercalciferol (HECTOROL IV) Doxercalciferol (Hectorol) 12/28/22 12/27/23  [provider]  hydrOXYzine (ATARAX) 10 MG tablet TAKE 1 TABLET (10 MG TOTAL) BY MOUTH 3 (THREE) TIMES DAILY AS NEEDED FOR ANXIETY OR ITCHING. 12/27/22   Reubin Milan, MD  hydrOXYzine (ATARAX) 25 MG tablet Take 25 mg by mouth 3 (three) times daily as needed for anxiety. Patient not taking: Reported on 01/01/2023    [provider]  levofloxacin (LEVAQUIN) 250 MG tablet 2 tabs on day 1 then 1 tab on days 3, 5 and 7. 12/27/22   Reubin Milan, MD  Methoxy PEG-Epoetin Beta (MIRCERA IJ) Mircera 12/30/22 12/29/23  [provider]  metoprolol succinate (TOPROL XL) 25 MG 24 hr tablet Take 1 tablet (25 mg total) by mouth daily. 12/18/22   Debbe Odea, MD  midodrine (PROAMATINE) 5 MG tablet Take 5 mg by mouth 3 (three) times daily. 12/30/22   [provider]  Simethicone 500 MG CAPS Take 1 tablet by mouth every 8 (eight) hours as needed (gas). 12/28/22   Tommi Rumps, PA-C     Allergies  Augmentin [amoxicillin-pot clavulanate], Atorvastatin, Pioglitazone, Peanut-containing drug products, Rosuvastatin, Shellfish-derived products, and Sulfa antibiotics   Family History   Family History  Problem Relation Age of Onset   Diabetes Mother    Diabetes Sister      Physical Exam  Triage  Vital Signs: ED Triage Vitals  Enc Vitals Group     BP 01/11/23 0123 (!) 118/53     Pulse Rate 01/11/23 0123 86     Resp 01/11/23 0123 18     Temp 01/11/23 0123 97.9 F (36.6 C)     Temp src --      SpO2 01/11/23 0123 92 %     Weight 01/11/23 0122 198 lb (89.8 kg)     Height 01/11/23 0122 5\' 9"  (1.753 m)     Head Circumference --      Peak Flow --      Pain Score 01/11/23 0122 8     Pain Loc --      Pain Edu? --      Excl. in GC? --     Updated Vital Signs: BP (!) 128/54   Pulse 86   Temp 97.9 F (36.6 C)   Resp (!) 33   Ht 5\' 9"  (1.753 m)   Wt 89.8 kg   SpO2 98%  BMI 29.24 kg/m    General: Awake, mild distress.  CV:  RRR.  Good peripheral perfusion.  Resp:  Normal effort.  Diminished, otherwise CTAB. Abd:  Mild diffuse tenderness to palpation without rebound or guarding.  No distention.  Other:  BLE prosthesis.   ED Results / Procedures / Treatments  Labs (all labs ordered are listed, but only abnormal results are displayed) Labs Reviewed  CBC WITH DIFFERENTIAL/PLATELET - Abnormal; Notable for the following components:      Result Value   RBC 3.32 (*)    Hemoglobin 9.9 (*)    HCT 32.7 (*)    RDW 16.7 (*)    Platelets 134 (*)    All other components within normal limits  COMPREHENSIVE METABOLIC PANEL - Abnormal; Notable for the following components:   Sodium 132 (*)    Chloride 93 (*)    Glucose, Bld 187 (*)    BUN 40 (*)    Creatinine, Ser 6.94 (*)    Calcium 8.8 (*)    AST 43 (*)    ALT 47 (*)    Alkaline Phosphatase 186 (*)    Total Bilirubin 3.0 (*)    GFR, Estimated 6 (*)    All other components within normal limits  LIPASE, BLOOD     EKG  ED ECG REPORT I, Mikell Kazlauskas J, the attending physician, personally viewed and interpreted this ECG.   Date: 01/11/2023  EKG Time: 0244  Rate: 86  Rhythm: normal sinus rhythm  Axis: LAD  Intervals:left bundle branch block  ST&T Change: Nonspecific    RADIOLOGY I have independently visualized  and interpreted patient's x-ray as well as noted the radiology interpretation:  3 way abdomen: No obstruction, moderate stool burden  Official radiology report(s): DG Abd Acute W/Chest  Result Date: 01/11/2023 CLINICAL DATA:  Constipated EXAM: DG ABDOMEN ACUTE WITH 1 VIEW CHEST COMPARISON:  01/01/2023 FINDINGS: Cardiomegaly with vascular congestion. Right pleural effusion with right lower lobe atelectasis or scarring, stable. No confluent opacity on the left. Moderate stool burden in the colon. There is normal bowel gas pattern. No free air. No organomegaly or suspicious calcification. No acute bony abnormality. IMPRESSION: Chronic right pleural effusion with right base atelectasis or scarring, stable. Cardiomegaly, vascular congestion. Moderate stool burden in the colon. No bowel obstruction or free air. Electronically Signed   By: Charlett Nose M.D.   On: 01/11/2023 02:11     PROCEDURES:  Critical Care performed: No  .1-3 Lead EKG Interpretation  Performed by: Irean Hong, MD Authorized by: Irean Hong, MD     Interpretation: normal     ECG rate:  85   ECG rate assessment: normal     Rhythm: sinus rhythm     Ectopy: none     Conduction: normal   Comments:     Patient placed on cardiac monitor to evaluate for arrhythmias    MEDICATIONS ORDERED IN ED: Medications  traMADol (ULTRAM) tablet 50 mg (50 mg Oral Given 01/11/23 0314)  lactulose (CHRONULAC) 10 GM/15ML solution 30 g (30 g Oral Given 01/11/23 0314)  magnesium citrate solution 1 Bottle (1 Bottle Oral Given 01/11/23 0314)  docusate (COLACE) 50 MG/5ML liquid 100 mg (100 mg Per Tube Given 01/11/23 0314)     IMPRESSION / MDM / ASSESSMENT AND PLAN / ED COURSE  I reviewed the triage vital signs and the nursing notes.  57 year old female who presents with abdominal pain and constipation. Differential diagnosis includes, but is not limited to, ovarian cyst, ovarian torsion, acute appendicitis,  diverticulitis, urinary tract infection/pyelonephritis, endometriosis, bowel obstruction, colitis, renal colic, gastroenteritis, hernia, etc. I have personally reviewed patient's records and note a hospitalization 5/20-5/22/2024 for acute respiratory failure.  Patient's presentation is most consistent with acute presentation with potential threat to life or bodily function.  The patient is on the cardiac monitor to evaluate for evidence of arrhythmia and/or significant heart rate changes.  Will obtain cardiac panel, three-way abdomen and reassess.  Clinical Course as of 01/11/23 0502  Thu Jan 11, 2023  0234 Updated patient on x-ray results.  She accepts an enema.  Also complains of phantom pain; states tramadol usually relieves it.  Will administer enema and reassess. [JS]  0500 Patient had successful BM and feeling much better.  Will discharge home with lactulose prescription to use as needed.  Strict return precautions given.  Patient verbalizes understanding and agrees with plan of care. [JS]    Clinical Course User Index [JS] Irean Hong, MD     FINAL CLINICAL IMPRESSION(S) / ED DIAGNOSES   Final diagnoses:  Constipation, unspecified constipation type     Rx / DC Orders   ED Discharge Orders          Ordered    lactulose (CHRONULAC) 10 GM/15ML solution  Daily PRN        01/11/23 0500             Note:  This document was prepared using Dragon voice recognition software and may include unintentional dictation errors.   Irean Hong, MD 01/11/23 [phone]

## 2023-01-11 NOTE — Transitions of Care (Post Inpatient/ED Visit) (Signed)
01/11/2023  Name: Cindy Osborne MRN: [id number] DOB: [birth year]  Today's TOC FU Call Status: Today's TOC FU Call Status:: Successful TOC FU Call Competed TOC FU Call Complete Date: 01/11/23  Transition Care Management Follow-up Telephone Call Date of Discharge: 01/03/23 Discharge Facility: [facility]) Type of Discharge: Inpatient Admission Primary Inpatient Discharge Diagnosis:: Fatigue How have you been since you were released from the hospital?: Better Any questions or concerns?: No  Items Reviewed: Did you receive and understand the discharge instructions provided?: Yes Medications obtained,verified, and reconciled?: Yes (Medications Reviewed) Any new allergies since your discharge?: No Dietary orders reviewed?: No Do you have support at home?: Yes People in Home: alone Name of Support/Comfort Primary Source: Jasmine December  Medications Reviewed Today: Medications Reviewed Today     Reviewed by Luella Cook, RN (Case Manager) on 01/11/23 at 1434  Med List Status: <None>   Medication Order Taking? Sig Documenting Provider Last Dose Status Informant  apixaban (ELIQUIS) 2.5 MG TABS tablet [id number] Yes Take 1 tablet (2.5 mg total) by mouth 2 (two) times daily. Suanne Marker, MD Taking Active   Doxercalciferol (HECTOROL IV) [id number] Yes Doxercalciferol (Hectorol) [provider] Taking Active   hydrOXYzine (ATARAX) 10 MG tablet [id number] Yes TAKE 1 TABLET (10 MG TOTAL) BY MOUTH 3 (THREE) TIMES DAILY AS NEEDED FOR ANXIETY OR ITCHING. Reubin Milan, MD Taking Active   hydrOXYzine (ATARAX) 25 MG tablet [id number]  Take 25 mg by mouth 3 (three) times daily as needed for anxiety.  Patient not taking: Reported on 01/01/2023   [provider]  Active   lactulose (CHRONULAC) 10 GM/15ML solution [id number] Yes Take 30 mLs (20 g total) by mouth daily as needed for mild constipation. Irean Hong, MD Taking Active   levofloxacin  (LEVAQUIN) 250 MG tablet [id number] Yes 2 tabs on day 1 then 1 tab on days 3, 5 and 7. Reubin Milan, MD Taking Active   Methoxy PEG-Epoetin Garey Ham [facility] IJ) [id number] Yes Mircera [provider] Taking Active   metoprolol succinate (TOPROL XL) 25 MG 24 hr tablet [id number] Yes Take 1 tablet (25 mg total) by mouth daily. Debbe Odea, MD Taking Active   midodrine (PROAMATINE) 5 MG tablet [id number] Yes Take 5 mg by mouth 3 (three) times daily. [provider] Taking Active   Simethicone 500 MG CAPS [id number] Yes Take 1 tablet by mouth every 8 (eight) hours as needed (gas). Tommi Rumps, PA-C Taking Active             Home Care and Equipment/Supplies: Were Home Health Services Ordered?: Yes Name of Home Health Agency:: [facility] Has Agency set up a time to come to your home?: Yes First Home Health Visit Date: 01/05/23 Any new equipment or medical supplies ordered?: NA  Functional Questionnaire: Do you need assistance with bathing/showering or dressing?: Yes Do you need assistance with meal preparation?: Yes Do you need assistance with eating?: No Do you have difficulty maintaining continence: No Do you need assistance with getting out of bed/getting out of a chair/moving?: Yes Do you have difficulty managing or taking your medications?: No  Follow up appointments reviewed: PCP Follow-up appointment confirmed?: Yes Date of PCP follow-up appointment?: 01/15/23 Follow-up Provider: [facility] Follow-up appointment confirmed?: Yes Date of Specialist follow-up appointment?: 01/31/23 Follow-Up Specialty Provider:: Dr Lalla Brothers Do you need transportation to your follow-up appointment?: No Do you understand care options if your condition(s) worsen?: Yes-patient verbalized understanding  SDOH Interventions Today  Flowsheet Row Most Recent Value  SDOH Interventions   Food Insecurity Interventions Intervention Not Indicated  Housing  Interventions Intervention Not Indicated  Transportation Interventions Intervention Not Indicated      Interventions Today    Flowsheet Row Most Recent Value  General Interventions   General Interventions Discussed/Reviewed General Interventions Discussed, General Interventions Reviewed, Doctor Visits  Doctor Visits Discussed/Reviewed Doctor Visits Discussed, Doctor Visits Reviewed      [facility] Interventions Today    Flowsheet Row Most Recent Value  TOC Interventions   TOC Interventions Discussed/Reviewed TOC Interventions Discussed, TOC Interventions Reviewed        Gean Maidens BSN RN [facility] [phone]

## 2023-01-11 NOTE — ED Triage Notes (Signed)
Pt to ED via EMS from home, pt c/o constipation. No BM x5 days. Pt has tried PO medications for relief with no success. Pt has discomfort to abd. Pt is dialysis pt, dialysis due this morning (Thursday).

## 2023-01-11 NOTE — ED Notes (Signed)
Pt has had 1 Lg BM and multiple small stone like BM's, MD notified

## 2023-01-12 ENCOUNTER — Telehealth: Payer: Self-pay

## 2023-01-12 NOTE — Transitions of Care (Post Inpatient/ED Visit) (Unsigned)
   01/12/2023  Name: Quisha Duplantis MRN: [id number] DOB: May 02, 1966  Today's TOC FU Call Status: Today's TOC FU Call Status:: Unsuccessul Call (1st Attempt) Unsuccessful Call (1st Attempt) Date: 01/12/23 [facility] FU Call Complete Date: 01/12/23  Attempted to reach the patient regarding the most recent Inpatient/ED visit.  Follow Up Plan: Additional outreach attempts will be made to reach the patient to complete the Transitions of Care (Post Inpatient/ED visit) call.   Gamaliel Charney [facility]  Primary Care & Sports Medicine at [facility], [facility] [address]  [place] [address] Office [phone]  Fax: [phone]

## 2023-01-15 ENCOUNTER — Encounter: Payer: Medicare Other | Admitting: Physical Therapy

## 2023-01-15 ENCOUNTER — Ambulatory Visit (INDEPENDENT_AMBULATORY_CARE_PROVIDER_SITE_OTHER): Payer: Medicare Other | Admitting: Internal Medicine

## 2023-01-15 ENCOUNTER — Encounter: Payer: Self-pay | Admitting: Internal Medicine

## 2023-01-15 VITALS — BP 126/76 | HR 66 | Ht 69.0 in | Wt 204.0 lb

## 2023-01-15 DIAGNOSIS — F39 Unspecified mood [affective] disorder: Secondary | ICD-10-CM

## 2023-01-15 DIAGNOSIS — F410 Panic disorder [episodic paroxysmal anxiety] without agoraphobia: Secondary | ICD-10-CM

## 2023-01-15 DIAGNOSIS — F5101 Primary insomnia: Secondary | ICD-10-CM

## 2023-01-15 DIAGNOSIS — K5901 Slow transit constipation: Secondary | ICD-10-CM

## 2023-01-15 MED ORDER — TRAZODONE HCL 50 MG PO TABS
50.0000 mg | ORAL_TABLET | Freq: Every evening | ORAL | 2 refills | Status: DC | PRN
Start: 2023-01-15 — End: 2023-02-06

## 2023-01-15 NOTE — Progress Notes (Signed)
Date:  01/15/2023   Name:  Cindy Osborne   DOB:  11-25-1965   MRN:  [id number]   Chief Complaint: Hospitalization Follow-up and Anxiety (Wants to discuss anxiety and sleep. Not sleeping well at night, and having panic attacks. Having 2-3 panic attacks. ) ED follow up for constipation.  Work up was reassuring.  She was given an enema with successful BM and improved symptoms.  She was discharged home with lactulose to use as needed.  Constipation This is a new problem. The problem is unchanged. Her stool frequency is 2 to 3 times per week. The patient is not on a high fiber diet. She Does not exercise regularly. There has Not been adequate water intake. Risk factors include immobility and recent illness.  Insomnia Primary symptoms: sleep disturbance, difficulty falling asleep, frequent awakening.   The problem occurs every several days. Treatments tried: Trazodone given at the hospital helped. PMH includes: depression.   Depression        This is a new problem.  The onset quality is undetermined.   Associated symptoms include decreased concentration, fatigue, helplessness, insomnia, irritable, decreased interest and sad.   Lab Results  Component Value Date   NA 132 (L) 01/11/2023   K 4.0 01/11/2023   CO2 25 01/11/2023   GLUCOSE 187 (H) 01/11/2023   BUN 40 (H) 01/11/2023   CREATININE 6.94 (H) 01/11/2023   CALCIUM 8.8 (L) 01/11/2023   GFRNONAA 6 (L) 01/11/2023   Lab Results  Component Value Date   CHOL 107 03/22/2022   HDL 35 (L) 03/22/2022   LDLCALC 55 03/22/2022   TRIG 84 03/22/2022   CHOLHDL 3.1 03/22/2022   Lab Results  Component Value Date   TSH 2.546 01/01/2023   Lab Results  Component Value Date   HGBA1C 5.3 01/01/2023   Lab Results  Component Value Date   WBC 7.3 01/11/2023   HGB 9.9 (L) 01/11/2023   HCT 32.7 (L) 01/11/2023   MCV 98.5 01/11/2023   PLT 134 (L) 01/11/2023   Lab Results  Component Value Date   ALT 47 (H) 01/11/2023   AST 43 (H) 01/11/2023    ALKPHOS 186 (H) 01/11/2023   BILITOT 3.0 (H) 01/11/2023   No results found for: "25OHVITD2", "25OHVITD3", "VD25OH"   Review of Systems  Constitutional:  Positive for fatigue. Negative for chills and diaphoresis.  HENT:  Negative for trouble swallowing.   Respiratory:  Negative for chest tightness and shortness of breath.   Cardiovascular:  Negative for chest pain and palpitations.  Gastrointestinal:  Positive for constipation.  Psychiatric/Behavioral:  Positive for decreased concentration, depression and sleep disturbance. The patient is nervous/anxious and has insomnia.     Patient Active Problem List   Diagnosis Date Noted   Acute respiratory distress 01/02/2023   Fatigue 01/01/2023   Type II diabetes mellitus with renal manifestations (HCC) 05/30/2022   HLD (hyperlipidemia) 05/30/2022   Obesity with body mass index (BMI) of 30.0 to 39.9 05/30/2022   Abnormal LFTs 05/30/2022   Chronic diastolic CHF (congestive heart failure) (HCC) 05/30/2022   Thyroid nodule 05/30/2022   Pleural effusion on right 05/30/2022   PAF (paroxysmal atrial fibrillation) (HCC) 05/30/2022   Intractable nausea and vomiting 05/27/2022   Weakness of left upper extremity 05/11/2022   History of stroke with current residual effects 05/08/2022   Mood disorder (HCC) 05/08/2022   Pressure injury of skin 04/05/2022   Acute right arterial ischemic stroke, MCA (middle cerebral artery) (HCC) 01/21/2022   Combined forms  of age-related cataract of both eyes 06/30/2021   Diabetic macular edema (HCC) 06/30/2021   Encounter for insertion of mirena IUD 03/15/2021   Panic disorder 03/03/2021   Acquired absence of left leg below knee (HCC) 10/26/2020   PVD (peripheral vascular disease) (HCC) 07/28/2020   Uncontrolled type 2 diabetes mellitus with diabetic peripheral angiopathy without gangrene 02/26/2020   Acquired absence of right leg below knee (HCC) 07/28/2019   Diabetic retinopathy (HCC) 11/23/2016   Slow transit  constipation 09/24/2015   Environmental and seasonal allergies 07/09/2015   Anemia in ESRD (end-stage renal disease) (HCC) 07/09/2015   ESRD (end stage renal disease) on dialysis (HCC) 07/09/2015   Type II diabetes mellitus with complication (HCC) 07/09/2015   Herpes simplex infection 07/09/2015   Hyperlipidemia associated with type 2 diabetes mellitus (HCC) 07/09/2015   Heart & renal disease, hypertensive, with heart failure (HCC) 07/09/2015   Avitaminosis D 07/09/2015   Obstructive apnea 12/15/2014   Essential hypertension 09/11/2013    Allergies  Allergen Reactions   Augmentin [Amoxicillin-Pot Clavulanate] Shortness Of Breath   Atorvastatin Other (See Comments)    Back pain   Pioglitazone Other (See Comments)    Edema   Peanut-Containing Drug Products Itching   Rosuvastatin Itching and Other (See Comments)    Back pain   Shellfish-Derived Products Itching and Rash    shrimp   Sulfa Antibiotics Rash and Other (See Comments)    Shedding of skin    Past Surgical History:  Procedure Laterality Date   angioplasty politeal Left 07/2020   avg for dialysis     CHOLECYSTECTOMY     COLONOSCOPY  08/15/2011   cleared for 10 yrs   IR CT HEAD LTD  01/21/2022   IR CT HEAD LTD  03/21/2022   IR PERCUTANEOUS ART THROMBECTOMY/INFUSION INTRACRANIAL INC DIAG ANGIO  01/21/2022   IR PERCUTANEOUS ART THROMBECTOMY/INFUSION INTRACRANIAL INC DIAG ANGIO  03/21/2022   IR US GUIDE VASC ACCESS RIGHT  01/21/2022   IR US GUIDE VASC ACCESS RIGHT  03/21/2022   LEG AMPUTATION BELOW KNEE Right 04/2019   LEG AMPUTATION BELOW KNEE Left 09/08/2020   RADIOLOGY WITH ANESTHESIA N/A 01/21/2022   Procedure: IR WITH ANESTHESIA;  Surgeon: Radiologist, Medication, MD;  Location: [facility] OR;  Service: Radiology;  Laterality: N/A;   RADIOLOGY WITH ANESTHESIA N/A 03/21/2022   Procedure: IR WITH ANESTHESIA;  Surgeon: Radiologist, Medication, MD;  Location: [facility] OR;  Service: Radiology;  Laterality: N/A;   TOE AMPUTATION Right  10/15/2018    Social History   Tobacco Use   Smoking status: Never   Smokeless tobacco: Never  Vaping Use   Vaping Use: Never used  Substance Use Topics   Alcohol use: Yes   Drug use: Never     Medication list has been reviewed and updated.  Current Meds  Medication Sig   apixaban (ELIQUIS) 2.5 MG TABS tablet Take 1 tablet (2.5 mg total) by mouth 2 (two) times daily.   Doxercalciferol (HECTOROL IV) Doxercalciferol (Hectorol)   lactulose (CHRONULAC) 10 GM/15ML solution Take 30 mLs (20 g total) by mouth daily as needed for mild constipation.   Methoxy PEG-Epoetin Beta (MIRCERA IJ) Mircera   metoprolol succinate (TOPROL XL) 25 MG 24 hr tablet Take 1 tablet (25 mg total) by mouth daily.   midodrine (PROAMATINE) 5 MG tablet Take 5 mg by mouth 3 (three) times daily.   Simethicone 500 MG CAPS Take 1 tablet by mouth every 8 (eight) hours as needed (gas).   traZODone (DESYREL) 50  MG tablet Take 1 tablet (50 mg total) by mouth at bedtime as needed for sleep.   [DISCONTINUED] hydrOXYzine (ATARAX) 10 MG tablet TAKE 1 TABLET (10 MG TOTAL) BY MOUTH 3 (THREE) TIMES DAILY AS NEEDED FOR ANXIETY OR ITCHING.   [DISCONTINUED] hydrOXYzine (ATARAX) 25 MG tablet Take 25 mg by mouth 3 (three) times daily as needed for anxiety.   [DISCONTINUED] levofloxacin (LEVAQUIN) 250 MG tablet 2 tabs on day 1 then 1 tab on days 3, 5 and 7.       01/15/2023    1:41 PM 12/27/2022   11:18 AM 12/13/2022    3:03 PM 08/21/2022    9:51 AM  GAD 7 : Generalized Anxiety Score  Nervous, Anxious, on Edge 3 2 1 2   Control/stop worrying 1 0 3 1  Worry too much - different things 3 0 3 1  Trouble relaxing 3 1 1 1   Restless 3 2 0 1  Easily annoyed or irritable 3 2 3 1   Afraid - awful might happen 0 0 0 1  Total GAD 7 Score 16 7 11 8   Anxiety Difficulty Somewhat difficult Not difficult at all Somewhat difficult Somewhat difficult       01/15/2023    1:40 PM 12/27/2022   11:18 AM 12/13/2022    3:02 PM  Depression screen PHQ  2/9  Decreased Interest 1 2 1   Down, Depressed, Hopeless 1 0 1  PHQ - 2 Score 2 2 2   Altered sleeping 3 0 0  Tired, decreased energy 1 3 0  Change in appetite 3 2 0  Feeling bad or failure about yourself  1 0 3  Trouble concentrating 3 0 0  Moving slowly or fidgety/restless 2 0 0  Suicidal thoughts 1 0 0  PHQ-9 Score 16 7 5   Difficult doing work/chores Somewhat difficult Somewhat difficult Somewhat difficult    BP Readings from Last 3 Encounters:  01/15/23 126/76  01/11/23 (!) 141/96  01/03/23 128/82    Physical Exam Vitals and nursing note reviewed.  Constitutional:      General: She is irritable. She is not in acute distress.    Appearance: She is well-developed.  HENT:     Head: Normocephalic and atraumatic.  Neck:     Vascular: No carotid bruit.  Cardiovascular:     Rate and Rhythm: Normal rate and regular rhythm.  Pulmonary:     Effort: Pulmonary effort is normal. No respiratory distress.     Breath sounds: No wheezing or rhonchi.  Musculoskeletal:     Cervical back: Normal range of motion.  Lymphadenopathy:     Cervical: No cervical adenopathy.  Skin:    General: Skin is warm and dry.     Findings: No rash.  Neurological:     Mental Status: She is alert and oriented to person, place, and time.  Psychiatric:        Mood and Affect: Mood normal. Affect is tearful.        Speech: Speech normal.        Behavior: Behavior normal.     Wt Readings from Last 3 Encounters:  01/15/23 204 lb (92.5 kg)  01/11/23 198 lb (89.8 kg)  01/03/23 210 lb 8.6 oz (95.5 kg)    BP 126/76   Pulse 66   Ht 5\' 9"  (1.753 m)   Wt 204 lb (92.5 kg)   SpO2 99%   BMI 30.13 kg/m   Assessment and Plan:  Problem List Items Addressed This Visit  Slow transit constipation - Primary (Chronic)    Severe symptoms last week. ED visit reassuring.  Lactulose prescribed after successful enema.      Panic disorder    Vistaril has been discontinued. May need to resume before HD if  panic attacks continue      Relevant Medications   traZODone (DESYREL) 50 MG tablet   Mood disorder (HCC)    Depressive symptoms are worsening along with panic attacks Will start Trazodone every evening to help with sleep and mood Re-evaluate if no benefit.      Other Visit Diagnoses     Primary insomnia       Relevant Medications   traZODone (DESYREL) 50 MG tablet       No follow-ups on file.   Partially dictated using Dragon software, any errors are not intentional.  Reubin Milan, MD [facility] Primary Care and Sports Medicine [place], [place]

## 2023-01-15 NOTE — Patient Instructions (Signed)
Take Trazodone 50 mg - 1-2 hours before bed each evening.

## 2023-01-15 NOTE — Assessment & Plan Note (Signed)
Vistaril has been discontinued. May need to resume before HD if panic attacks continue

## 2023-01-15 NOTE — Assessment & Plan Note (Signed)
Depressive symptoms are worsening along with panic attacks Will start Trazodone every evening to help with sleep and mood Re-evaluate if no benefit.

## 2023-01-15 NOTE — Assessment & Plan Note (Signed)
Severe symptoms last week. ED visit reassuring.  Lactulose prescribed after successful enema.

## 2023-01-16 ENCOUNTER — Observation Stay: Payer: Medicare Other

## 2023-01-16 ENCOUNTER — Inpatient Hospital Stay
Admission: EM | Admit: 2023-01-16 | Discharge: 2023-01-20 | DRG: 291 | Disposition: A | Discharge status: Home / Self Care | Payer: Medicare Other | Attending: Internal Medicine | Admitting: Internal Medicine

## 2023-01-16 ENCOUNTER — Emergency Department: Payer: Medicare Other

## 2023-01-16 DIAGNOSIS — N2581 Secondary hyperparathyroidism of renal origin: Secondary | ICD-10-CM | POA: Diagnosis present

## 2023-01-16 DIAGNOSIS — E1151 Type 2 diabetes mellitus with diabetic peripheral angiopathy without gangrene: Secondary | ICD-10-CM | POA: Diagnosis present

## 2023-01-16 DIAGNOSIS — B348 Other viral infections of unspecified site: Secondary | ICD-10-CM

## 2023-01-16 DIAGNOSIS — Z91010 Allergy to peanuts: Secondary | ICD-10-CM

## 2023-01-16 DIAGNOSIS — Z79899 Other long term (current) drug therapy: Secondary | ICD-10-CM

## 2023-01-16 DIAGNOSIS — E872 Acidosis, unspecified: Secondary | ICD-10-CM | POA: Diagnosis present

## 2023-01-16 DIAGNOSIS — I5022 Chronic systolic (congestive) heart failure: Secondary | ICD-10-CM | POA: Diagnosis not present

## 2023-01-16 DIAGNOSIS — Z888 Allergy status to other drugs, medicaments and biological substances status: Secondary | ICD-10-CM

## 2023-01-16 DIAGNOSIS — R7401 Elevation of levels of liver transaminase levels: Secondary | ICD-10-CM | POA: Diagnosis present

## 2023-01-16 DIAGNOSIS — E785 Hyperlipidemia, unspecified: Secondary | ICD-10-CM | POA: Diagnosis present

## 2023-01-16 DIAGNOSIS — J90 Pleural effusion, not elsewhere classified: Secondary | ICD-10-CM | POA: Diagnosis not present

## 2023-01-16 DIAGNOSIS — I132 Hypertensive heart and chronic kidney disease with heart failure and with stage 5 chronic kidney disease, or end stage renal disease: Principal | ICD-10-CM | POA: Diagnosis present

## 2023-01-16 DIAGNOSIS — Z6830 Body mass index (BMI) 30.0-30.9, adult: Secondary | ICD-10-CM

## 2023-01-16 DIAGNOSIS — Z89511 Acquired absence of right leg below knee: Secondary | ICD-10-CM

## 2023-01-16 DIAGNOSIS — Z833 Family history of diabetes mellitus: Secondary | ICD-10-CM

## 2023-01-16 DIAGNOSIS — D631 Anemia in chronic kidney disease: Secondary | ICD-10-CM | POA: Diagnosis present

## 2023-01-16 DIAGNOSIS — N186 End stage renal disease: Secondary | ICD-10-CM | POA: Diagnosis present

## 2023-01-16 DIAGNOSIS — Z7901 Long term (current) use of anticoagulants: Secondary | ICD-10-CM

## 2023-01-16 DIAGNOSIS — E118 Type 2 diabetes mellitus with unspecified complications: Secondary | ICD-10-CM | POA: Diagnosis present

## 2023-01-16 DIAGNOSIS — Z89512 Acquired absence of left leg below knee: Secondary | ICD-10-CM

## 2023-01-16 DIAGNOSIS — R7989 Other specified abnormal findings of blood chemistry: Secondary | ICD-10-CM | POA: Diagnosis present

## 2023-01-16 DIAGNOSIS — I69354 Hemiplegia and hemiparesis following cerebral infarction affecting left non-dominant side: Secondary | ICD-10-CM

## 2023-01-16 DIAGNOSIS — I480 Paroxysmal atrial fibrillation: Secondary | ICD-10-CM | POA: Diagnosis present

## 2023-01-16 DIAGNOSIS — Z1152 Encounter for screening for COVID-19: Secondary | ICD-10-CM

## 2023-01-16 DIAGNOSIS — E119 Type 2 diabetes mellitus without complications: Secondary | ICD-10-CM | POA: Diagnosis present

## 2023-01-16 DIAGNOSIS — F419 Anxiety disorder, unspecified: Secondary | ICD-10-CM | POA: Diagnosis present

## 2023-01-16 DIAGNOSIS — E669 Obesity, unspecified: Secondary | ICD-10-CM | POA: Diagnosis present

## 2023-01-16 DIAGNOSIS — J189 Pneumonia, unspecified organism: Secondary | ICD-10-CM | POA: Diagnosis present

## 2023-01-16 DIAGNOSIS — I509 Heart failure, unspecified: Secondary | ICD-10-CM

## 2023-01-16 DIAGNOSIS — B9789 Other viral agents as the cause of diseases classified elsewhere: Secondary | ICD-10-CM | POA: Diagnosis present

## 2023-01-16 DIAGNOSIS — I420 Dilated cardiomyopathy: Secondary | ICD-10-CM | POA: Diagnosis present

## 2023-01-16 DIAGNOSIS — I2510 Atherosclerotic heart disease of native coronary artery without angina pectoris: Secondary | ICD-10-CM | POA: Diagnosis present

## 2023-01-16 DIAGNOSIS — Z9049 Acquired absence of other specified parts of digestive tract: Secondary | ICD-10-CM

## 2023-01-16 DIAGNOSIS — E876 Hypokalemia: Secondary | ICD-10-CM | POA: Diagnosis not present

## 2023-01-16 DIAGNOSIS — R531 Weakness: Secondary | ICD-10-CM

## 2023-01-16 DIAGNOSIS — R0603 Acute respiratory distress: Secondary | ICD-10-CM | POA: Diagnosis present

## 2023-01-16 DIAGNOSIS — K7460 Unspecified cirrhosis of liver: Secondary | ICD-10-CM | POA: Diagnosis present

## 2023-01-16 DIAGNOSIS — E1122 Type 2 diabetes mellitus with diabetic chronic kidney disease: Secondary | ICD-10-CM | POA: Diagnosis present

## 2023-01-16 DIAGNOSIS — Z992 Dependence on renal dialysis: Secondary | ICD-10-CM

## 2023-01-16 DIAGNOSIS — Z91013 Allergy to seafood: Secondary | ICD-10-CM

## 2023-01-16 DIAGNOSIS — Z881 Allergy status to other antibiotic agents status: Secondary | ICD-10-CM

## 2023-01-16 DIAGNOSIS — Z882 Allergy status to sulfonamides status: Secondary | ICD-10-CM

## 2023-01-16 DIAGNOSIS — R5383 Other fatigue: Secondary | ICD-10-CM | POA: Diagnosis present

## 2023-01-16 HISTORY — DX: End stage renal disease: N18.6

## 2023-01-16 HISTORY — DX: Pleural effusion, not elsewhere classified: J90

## 2023-01-16 HISTORY — DX: Nonrheumatic mitral (valve) insufficiency: I34.0

## 2023-01-16 HISTORY — DX: Unspecified systolic (congestive) heart failure: I50.20

## 2023-01-16 HISTORY — DX: Peripheral vascular disease, unspecified: I73.9

## 2023-01-16 LAB — BASIC METABOLIC PANEL
Anion gap: 17 — ABNORMAL HIGH (ref 5–15)
BUN: 17 mg/dL (ref 6–20)
CO2: 25 mmol/L (ref 22–32)
Calcium: 9 mg/dL (ref 8.9–10.3)
Chloride: 92 mmol/L — ABNORMAL LOW (ref 98–111)
Creatinine, Ser: 4.2 mg/dL — ABNORMAL HIGH (ref 0.44–1.00)
GFR, Estimated: 12 mL/min — ABNORMAL LOW (ref >60)
Glucose, Bld: 155 mg/dL — ABNORMAL HIGH (ref 70–99)
Potassium: 3.2 mmol/L — ABNORMAL LOW (ref 3.5–5.1)
Sodium: 134 mmol/L — ABNORMAL LOW (ref 135–145)

## 2023-01-16 LAB — CBC
HCT: 36.1 % (ref 36.0–46.0)
Hemoglobin: 11 g/dL — ABNORMAL LOW (ref 12.0–15.0)
MCH: 30.1 pg (ref 26.0–34.0)
MCHC: 30.5 g/dL (ref 30.0–36.0)
MCV: 98.6 fL (ref 80.0–100.0)
Platelets: 63 10*3/uL — ABNORMAL LOW (ref 150–400)
RBC: 3.66 MIL/uL — ABNORMAL LOW (ref 3.87–5.11)
RDW: 18.6 % — ABNORMAL HIGH (ref 11.5–15.5)
WBC: 7.8 10*3/uL (ref 4.0–10.5)
nRBC: 0.5 % — ABNORMAL HIGH (ref 0.0–0.2)

## 2023-01-16 LAB — LACTIC ACID, PLASMA
Lactic Acid, Venous: 2.5 mmol/L (ref 0.5–1.9)
Lactic Acid, Venous: 2.8 mmol/L (ref 0.5–1.9)

## 2023-01-16 MED ORDER — SODIUM CHLORIDE 0.9 % IV BOLUS (SEPSIS)
500.0000 mL | Freq: Once | INTRAVENOUS | Status: AC
  Administered 2023-01-16: 500 mL via INTRAVENOUS

## 2023-01-16 MED ORDER — POTASSIUM CHLORIDE CRYS ER 20 MEQ PO TBCR
40.0000 meq | EXTENDED_RELEASE_TABLET | Freq: Once | ORAL | Status: AC
  Administered 2023-01-16: 40 meq via ORAL
  Filled 2023-01-16: qty 2

## 2023-01-16 MED ORDER — SODIUM CHLORIDE 0.9 % IV SOLN
2.0000 g | INTRAVENOUS | Status: DC
  Administered 2023-01-16: 2 g via INTRAVENOUS
  Filled 2023-01-16: qty 20

## 2023-01-16 MED ORDER — SODIUM CHLORIDE 0.9 % IV SOLN
500.0000 mg | INTRAVENOUS | Status: DC
  Administered 2023-01-16: 500 mg via INTRAVENOUS
  Filled 2023-01-16: qty 5

## 2023-01-16 NOTE — Assessment & Plan Note (Signed)
This is chronic.  Infectious workup has previously been done.  Patient may have cirrhosis based on the liver ultrasound that we have on file from May 20.  Outpatient follow-up

## 2023-01-16 NOTE — ED Triage Notes (Signed)
Pt presents to the ED due to weakness. Pt states it started Saturday where she had no energy. Pt states she has been in and out of the ED for the past 3 weeks. Pt A&Ox4

## 2023-01-16 NOTE — ED Provider Notes (Signed)
[facility] Provider Note    Event Date/Time   First MD Initiated Contact with Patient 01/16/23 1845     (approximate)   History   Weakness   HPI  Cindy Osborne is a 57 y.o. female with history of ESRD on HD, T2DM, HTN presenting to the emergency department for evaluation of weakness and cough.  Patient reports that since Saturday she has had generalized weakness.  She additionally reports a cough productive of clear sputum.  Denies history of asthma or COPD.  No fevers.  She completed her dialysis session today but says that this did not significantly improve her weakness.  Does report that she felt similarly in the past and was later told that she had sepsis secondary to pneumonia.     Physical Exam   Triage Vital Signs: ED Triage Vitals  Enc Vitals Group     BP 01/16/23 1826 128/82     Pulse Rate 01/16/23 1826 84     Resp 01/16/23 1826 18     Temp 01/16/23 1826 97.9 F (36.6 C)     Temp Source 01/16/23 1826 Oral     SpO2 01/16/23 1826 94 %     Weight 01/16/23 1828 204 lb (92.5 kg)     Height --      Head Circumference --      Peak Flow --      Pain Score 01/16/23 1827 8     Pain Loc --      Pain Edu? --      Excl. in GC? --     Most recent vital signs: Vitals:   01/16/23 2249 01/17/23 0019  BP: 130/85 103/81  Pulse: 82 78  Resp: 20   Temp: 97.7 F (36.5 C) 97.9 F (36.6 C)  SpO2: 97% 91%     General: Awake, interactive  CV:  Regular rate, good peripheral perfusion.  Resp:  Lungs clear, mild increased work of breathing, lung sounds coarse at bilateral bases Abd:  Soft, nondistended.  Neuro:  Symmetric facial movement, fluid speech   ED Results / Procedures / Treatments   Labs (all labs ordered are listed, but only abnormal results are displayed) Labs Reviewed  BASIC METABOLIC PANEL - Abnormal; Notable for the following components:      Result Value   Sodium 134 (*)    Potassium 3.2 (*)    Chloride 92 (*)     Glucose, Bld 155 (*)    Creatinine, Ser 4.20 (*)    GFR, Estimated 12 (*)    Anion gap 17 (*)    All other components within normal limits  CBC - Abnormal; Notable for the following components:   RBC 3.66 (*)    Hemoglobin 11.0 (*)    RDW 18.6 (*)    Platelets 63 (*)    nRBC 0.5 (*)    All other components within normal limits  LACTIC ACID, PLASMA - Abnormal; Notable for the following components:   Lactic Acid, Venous 2.5 (*)    All other components within normal limits  LACTIC ACID, PLASMA - Abnormal; Notable for the following components:   Lactic Acid, Venous 2.8 (*)    All other components within normal limits  CULTURE, BLOOD (ROUTINE X 2)  CULTURE, BLOOD (ROUTINE X 2)  RESPIRATORY PANEL BY PCR  SARS CORONAVIRUS 2 BY RT PCR  CORTISOL-AM, BLOOD  SEDIMENTATION RATE  HEPATIC FUNCTION PANEL  PHOSPHORUS  BASIC METABOLIC PANEL  CBC  CBG MONITORING, ED  EKG EKG independently reviewed interpreted by myself (ER attending) demonstrates:  EKG demonstrates sinus rhythm at a rate of 83, PR 182, QRS 130, QTc 524, frequent PVCs, no acute ST changes  RADIOLOGY Imaging independently reviewed and interpreted by myself demonstrates:  CXR demonstrates airspace and interstitial opacities in the right middle and lower lung with an associated right pleural effusion.  PROCEDURES:  Critical Care performed: Yes, see critical care procedure note(s)  CRITICAL CARE Performed by: Trinna Post   Total critical care time: 30 minutes  Critical care time was exclusive of separately billable procedures and treating other patients.  Critical care was necessary to treat or prevent imminent or life-threatening deterioration.  Critical care was time spent personally by me on the following activities: development of treatment plan with patient and/or surrogate as well as nursing, discussions with consultants, evaluation of patient's response to treatment, examination of patient, obtaining history  from patient or surrogate, ordering and performing treatments and interventions, ordering and review of laboratory studies, ordering and review of radiographic studies, pulse oximetry and re-evaluation of patient's condition.   Procedures   MEDICATIONS ORDERED IN ED: Medications  cefTRIAXone (ROCEPHIN) 2 g in sodium chloride 0.9 % 100 mL IVPB (0 g Intravenous Stopped 01/16/23 2313)  azithromycin (ZITHROMAX) 500 mg in sodium chloride 0.9 % 250 mL IVPB (has no administration in time range)  hydrOXYzine (ATARAX) tablet 10 mg (has no administration in time range)  traZODone (DESYREL) tablet 50 mg (has no administration in time range)  metoprolol succinate (TOPROL-XL) 24 hr tablet 25 mg (has no administration in time range)  lactulose (CHRONULAC) 10 GM/15ML solution 20 g (has no administration in time range)  midodrine (PROAMATINE) tablet 5 mg (has no administration in time range)  sodium chloride flush (NS) 0.9 % injection 3 mL (has no administration in time range)  acetaminophen (TYLENOL) tablet 650 mg (has no administration in time range)    Or  acetaminophen (TYLENOL) suppository 650 mg (has no administration in time range)  potassium chloride SA (KLOR-CON M) CR tablet 40 mEq (40 mEq Oral Given 01/16/23 2045)  sodium chloride 0.9 % bolus 500 mL (0 mLs Intravenous Stopped 01/16/23 2358)     IMPRESSION / MDM / ASSESSMENT AND PLAN / ED COURSE  I reviewed the triage vital signs and the nursing notes.  Differential diagnosis includes, but is not limited to, pneumonia, pneumothorax, anemia, electrolyte abnormality  Patient's presentation is most consistent with acute presentation with potential threat to life or bodily function.  57 year old female presenting to the emergency department for evaluation of generalized weakness and shortness of breath.  O2 sat appropriate on room air here.  Chest x-Permelia Bamba does demonstrate right-sided opacities with a right-sided pleural effusion.  With her cough, I am  concerned that this could reflect an acute infection, though of note, x-Matsuko Kretz is somewhat similar to prior from 01/01/2023. Lab work demonstrated normal white blood cell count, but was noted an elevated lactate at 2.5.  Possible impaired clearance in the setting of her ESRD, but did just complete dialysis and her most recent prior elevated lactates do appear to have been in the setting of severe sepsis secondary to pneumonia.  A repeat was obtained which actually up trended to 2.8.  With the patient's acute respiratory symptoms, abnormal x-Fremont Skalicky, and elevated lactate, will reach out to hospitalist team to discuss admission.  Case discussed with hospitalist team.  They will come evaluate the patient for anticipated admission.      FINAL CLINICAL IMPRESSION(S) /  ED DIAGNOSES   Final diagnoses:  Pneumonia of right lung due to infectious organism, unspecified part of lung  Generalized weakness     Rx / DC Orders   ED Discharge Orders     None        Note:  This document was prepared using Dragon voice recognition software and may include unintentional dictation errors.   Trinna Post, MD 01/17/23 [phone]

## 2023-01-16 NOTE — Assessment & Plan Note (Signed)
I have sent a staff message to Dr. Mosetta Pigeon to kindly evaluated this patient for dialysis starting Thursday.  Patient already received dialysis on Tuesday and her electrolytes seem to be nonactionable at this time

## 2023-01-16 NOTE — ED Notes (Signed)
RN sent down full rainbow and set of cultures

## 2023-01-16 NOTE — Assessment & Plan Note (Signed)
This apparently is diet controlled as per the patient.  We will monitor routine blood glucose with BMP

## 2023-01-16 NOTE — ED Notes (Signed)
Lab called, state they have one set of blood cultures.

## 2023-01-16 NOTE — Assessment & Plan Note (Signed)
Is a chronic complaint for the patient at least since early May.  However this is also a prominent complaint in this encounter.  Patient has previously been tested for thyroid cascade and it is negative.  I will check a sed rate, last echo was in June last year.  Since then patient has had issues with hypotension her lactic acid today is elevated and she has had atrial fibrillation.  Therefore I feel that we should recheck the echo at this stage make sure that were not dealing with a congestive heart failure with a low output of the cardiac.  I will also check a cortisol level in the morning.  We will proceed accordingly thank you

## 2023-01-16 NOTE — Assessment & Plan Note (Signed)
Cannot rule out pneumonia at this time given patient's acute complaints of cough with sputum production.  Check COVID-19 testing.  Treat with ceftriaxone and azithromycin.  Blood cultures.  Check respiratory viral panel.  Patient has had an effusion since at least October of last year.  At the time the effusion was felt to be complicated and was aspirated.  I think we should repeat a CAT scan at this time not only to further delineate patient's current pathology of pneumonia but also to see if this effusion has since then progressed given especially that it was complicated.  Given patient's history of prior stroke I will check a swallow evaluation again in case patient is having silent aspiration at this time

## 2023-01-16 NOTE — Assessment & Plan Note (Addendum)
As I am still working up patient's pleural effusion and there is a chance that it may need to be tapped, I will hold off on patient's Eliquis at this time.  Heart rate is well-controlled.  Continue with metoprolol.  Cardiology note advises that patient should be continued on both midodrine and metoprolol.  Apparently patient blood pressure drops when she goes into atrial fibrillation and also during dialysis.  At this time I will continue both accordingly.

## 2023-01-16 NOTE — H&P (Signed)
History and Physical    Patient: Cindy Osborne ZOX:[id number] DOB: Jun 01, 1966 DOA: 01/16/2023 DOS: the patient was seen and examined on 01/16/2023 PCP: Reubin Milan, MD  Patient coming from: Home  Chief Complaint:  Chief Complaint  Patient presents with   Weakness   HPI: Cindy Osborne is a 57 y.o. female with medical history significant of ESRD on HD . Patient was inher usual state ofheathl till about early may. Since that time patinet reports marked fatigue as a major complaint. It has been interefiring with her acitivites of daily living. There has been no persistent fever, or rash or vomitng or diahrea. Paitent has also been complaining of sob. She was [facility] with diagnosis of " hypoxia" possibly due to a known chornic pleural effuion and discharged. However, patient has conitnued to have this complaint of fatigue. Curently she returns to ER with 4 days of cough with clear sputum and worsening sensation of sob and fatigue. There is no chest pain (except when she is couhging to clear up her sputum), no fever. No swelling (she is a bilatearl amputee).   ER course is notable for vital stability. Patinet has been started on iv abx for quesitonable pneumonia given right sided pleuarl effusion. Review of Systems: As mentioned in the history of present illness. All other systems reviewed and are negative. Past Medical History:  Diagnosis Date   Acute ischemic right MCA stroke (HCC) 03/25/2022   Amputated toe (HCC) 01/31/2012   Right second toe distal phalanx Right great toe   Diabetes mellitus without complication (HCC)    History of diabetic ulcer of foot 04/01/2014   Hyperlipidemia    Hypertension    Stroke (cerebrum) (HCC) 03/21/2022   Stroke (HCC) 01/21/2022   Past Surgical History:  Procedure Laterality Date   angioplasty politeal Left 07/2020   avg for dialysis     CHOLECYSTECTOMY     COLONOSCOPY  08/15/2011   cleared for 10 yrs   IR CT HEAD LTD  01/21/2022    IR CT HEAD LTD  03/21/2022   IR PERCUTANEOUS ART THROMBECTOMY/INFUSION INTRACRANIAL INC DIAG ANGIO  01/21/2022   IR PERCUTANEOUS ART THROMBECTOMY/INFUSION INTRACRANIAL INC DIAG ANGIO  03/21/2022   IR US GUIDE VASC ACCESS RIGHT  01/21/2022   IR US GUIDE VASC ACCESS RIGHT  03/21/2022   LEG AMPUTATION BELOW KNEE Right 04/2019   LEG AMPUTATION BELOW KNEE Left 09/08/2020   RADIOLOGY WITH ANESTHESIA N/A 01/21/2022   Procedure: IR WITH ANESTHESIA;  Surgeon: Radiologist, Medication, MD;  Location: [facility] OR;  Service: Radiology;  Laterality: N/A;   RADIOLOGY WITH ANESTHESIA N/A 03/21/2022   Procedure: IR WITH ANESTHESIA;  Surgeon: Radiologist, Medication, MD;  Location: [facility] OR;  Service: Radiology;  Laterality: N/A;   TOE AMPUTATION Right 10/15/2018   Social History:  reports that she has never smoked. She has never used smokeless tobacco. She reports current alcohol use. She reports that she does not use drugs.  Allergies  Allergen Reactions   Augmentin [Amoxicillin-Pot Clavulanate] Shortness Of Breath   Atorvastatin Other (See Comments)    Back pain   Pioglitazone Other (See Comments)    Edema   Peanut-Containing Drug Products Itching   Rosuvastatin Itching and Other (See Comments)    Back pain   Shellfish-Derived Products Itching and Rash    shrimp   Sulfa Antibiotics Rash and Other (See Comments)    Shedding of skin    Family History  Problem Relation Age of Onset   Diabetes  Mother    Diabetes Sister     Prior to Admission medications   Medication Sig Start Date End Date Taking? Authorizing Provider  apixaban (ELIQUIS) 2.5 MG TABS tablet Take 1 tablet (2.5 mg total) by mouth 2 (two) times daily. 11/27/22  Yes Penumalli, Glenford Bayley, MD  Doxercalciferol (HECTOROL IV) Doxercalciferol (Hectorol) 12/28/22 12/27/23 Yes [provider]  hydrOXYzine (ATARAX) 10 MG tablet Take 10 mg by mouth 3 (three) times daily.   Yes [provider]  lactulose (CHRONULAC) 10 GM/15ML solution Take 30  mLs (20 g total) by mouth daily as needed for mild constipation. 01/11/23  Yes Irean Hong, MD  Methoxy PEG-Epoetin Beta (MIRCERA IJ) Mircera 12/30/22 12/29/23 Yes [provider]  metoprolol succinate (TOPROL XL) 25 MG 24 hr tablet Take 1 tablet (25 mg total) by mouth daily. 12/18/22  Yes Agbor-Etang, Arlys John, MD  midodrine (PROAMATINE) 10 MG tablet Take 5 mg by mouth 3 (three) times daily. 12/30/22  Yes [provider]  Simethicone 500 MG CAPS Take 1 tablet by mouth every 8 (eight) hours as needed (gas). 12/28/22  Yes Tommi Rumps, PA-C  sucroferric oxyhydroxide (VELPHORO) 500 MG chewable tablet Chew 500 mg by mouth 3 (three) times daily with meals.   Yes [provider]  traZODone (DESYREL) 50 MG tablet Take 1 tablet (50 mg total) by mouth at bedtime as needed for sleep. 01/15/23  Yes Reubin Milan, MD    Physical Exam: Vitals:   01/16/23 1828 01/16/23 2100 01/16/23 2130 01/16/23 2249  BP:  (!) 134/90 127/83 130/85  Pulse:  78 77 82  Resp:  13 16 20   Temp:    97.7 F (36.5 C)  TempSrc:    Oral  SpO2:  100% 100% 97%  Weight: 92.5 kg      General: Patient is on room air, does not appear to be in any distress Sitting in stretcher Respiratory exam: Bilateral air entry vesicular with reduced breath sounds in right lung field posteriorly at the bases.  With dullness to percussion Cardiovascular exam S1-S2 normal Abdomen soft nontender Extremities warm without edema.  Patient has bilateral amputations of the legs. Neurologically no focal motor deficit slight left hemiparesis that patient reports is chronic due to prior stroke Data Reviewed:  Labs on Admission:  Results for orders placed or performed during the hospital encounter of 01/16/23 (from the past 24 hour(s))  Lactic acid, plasma     Status: Abnormal   Collection Time: 01/16/23  6:41 PM  Result Value Ref Range   Lactic Acid, Venous 2.5 (HH) 0.5 - 1.9 mmol/L  Basic metabolic panel     Status: Abnormal    Collection Time: 01/16/23  6:42 PM  Result Value Ref Range   Sodium 134 (L) 135 - 145 mmol/L   Potassium 3.2 (L) 3.5 - 5.1 mmol/L   Chloride 92 (L) 98 - 111 mmol/L   CO2 25 22 - 32 mmol/L   Glucose, Bld 155 (H) 70 - 99 mg/dL   BUN 17 6 - 20 mg/dL   Creatinine, Ser 4.09 (H) 0.44 - 1.00 mg/dL   Calcium 9.0 8.9 - 81.1 mg/dL   GFR, Estimated 12 (L) >60 mL/min   Anion gap 17 (H) 5 - 15  CBC     Status: Abnormal   Collection Time: 01/16/23  6:42 PM  Result Value Ref Range   WBC 7.8 4.0 - 10.5 K/uL   RBC 3.66 (L) 3.87 - 5.11 MIL/uL   Hemoglobin 11.0 (  L) 12.0 - 15.0 g/dL   HCT 43.3 29.5 - 18.8 %   MCV 98.6 80.0 - 100.0 fL   MCH 30.1 26.0 - 34.0 pg   MCHC 30.5 30.0 - 36.0 g/dL   RDW 41.6 (H) 60.6 - 30.1 %   Platelets 63 (L) 150 - 400 K/uL   nRBC 0.5 (H) 0.0 - 0.2 %  Lactic acid, plasma     Status: Abnormal   Collection Time: 01/16/23  8:41 PM  Result Value Ref Range   Lactic Acid, Venous 2.8 (HH) 0.5 - 1.9 mmol/L   Basic Metabolic Panel: Recent Labs  Lab 01/11/23 0216 01/16/23 1842  NA 132* 134*  K 4.0 3.2*  CL 93* 92*  CO2 25 25  GLUCOSE 187* 155*  BUN 40* 17  CREATININE 6.94* 4.20*  CALCIUM 8.8* 9.0   Liver Function Tests: Recent Labs  Lab 01/11/23 0216  AST 43*  ALT 47*  ALKPHOS 186*  BILITOT 3.0*  PROT 8.0  ALBUMIN 4.1   Recent Labs  Lab 01/11/23 0216  LIPASE 44   No results for input(s): "AMMONIA" in the last 168 hours. CBC: Recent Labs  Lab 01/11/23 0216 01/16/23 1842  WBC 7.3 7.8  NEUTROABS 4.5  --   HGB 9.9* 11.0*  HCT 32.7* 36.1  MCV 98.5 98.6  PLT 134* 63*   Cardiac Enzymes: No results for input(s): "CKTOTAL", "CKMB", "CKMBINDEX", "TROPONINIHS" in the last 168 hours.  BNP (last 3 results) No results for input(s): "PROBNP" in the last 8760 hours. CBG: No results for input(s): "GLUCAP" in the last 168 hours.  Radiological Exams on Admission:  DG Chest Port 1 View  Result Date: 01/16/2023 CLINICAL DATA:  Weakness and Saturday EXAM:  PORTABLE CHEST 1 VIEW COMPARISON:  Radiographs 01/11/2023 FINDINGS: Stable cardiomegaly. Airspace and interstitial opacities in the right mid and lower lung. Moderate right pleural effusion. The left lung is clear. No pneumothorax. No displaced rib fractures. IMPRESSION: Airspace and interstitial opacities in the right mid and lower lung with moderate right pleural effusion similar to prior. Cardiomegaly. Electronically Signed   By: Minerva Fester M.D.   On: 01/16/2023 19:20    EKG: Independently reviewed. NSR PVC   Assessment and Plan: * Pleural effusion on right Cannot rule out pneumonia at this time given patient's acute complaints of cough with sputum production.  Check COVID-19 testing.  Treat with ceftriaxone and azithromycin.  Blood cultures.  Check respiratory viral panel.  Patient has had an effusion since at least October of last year.  At the time the effusion was felt to be complicated and was aspirated.  I think we should repeat a CAT scan at this time not only to further delineate patient's current pathology of pneumonia but also to see if this effusion has since then progressed given especially that it was complicated.  Given patient's history of prior stroke I will check a swallow evaluation again in case patient is having silent aspiration at this time  Abnormal LFTs This is chronic.  Infectious workup has previously been done.  Patient may have cirrhosis based on the liver ultrasound that we have on file from May 20.  Outpatient follow-up  ESRD (end stage renal disease) on dialysis [facility]) I have sent a staff message to Dr. Mosetta Pigeon to kindly evaluated this patient for dialysis starting Thursday.  Patient already received dialysis on Tuesday and her electrolytes seem to be nonactionable at this time  PAF (paroxysmal atrial fibrillation) (HCC) As I am still working  up patient's pleural effusion and there is a chance that it may need to be tapped, I will hold off on patient's  Eliquis at this time.  Heart rate is well-controlled.  Continue with metoprolol.  Cardiology note advises that patient should be continued on both midodrine and metoprolol.  Apparently patient blood pressure drops when she goes into atrial fibrillation and also during dialysis.  At this time I will continue both accordingly.  Fatigue Is a chronic complaint for the patient at least since early May.  However this is also a prominent complaint in this encounter.  Patient has previously been tested for thyroid cascade and it is negative.  I will check a sed rate, last echo was in June last year.  Since then patient has had issues with hypotension her lactic acid today is elevated and she has had atrial fibrillation.  Therefore I feel that we should recheck the echo at this stage make sure that were not dealing with a congestive heart failure with a low output of the cardiac.  I will also check a cortisol level in the morning.  We will proceed accordingly thank you  Type II diabetes mellitus with complication (HCC) This apparently is diet controlled as per the patient.  We will monitor routine blood glucose with BMP      Advance Care Planning:   Code Status: Full Code   Consults: I have sent a staff message to nephrology attending Mosetta Pigeon for patient routine HD on Thursday. Kindly follow up.  Family Communication: patient sister was on phone during this encounter.  Severity of Illness: The appropriate patient status for this patient is INPATIENT. Inpatient status is judged to be reasonable and necessary in order to provide the required intensity of service to ensure the patient's safety. The patient's presenting symptoms, physical exam findings, and initial radiographic and laboratory data in the context of their chronic comorbidities is felt to place them at high risk for further clinical deterioration. Furthermore, it is not anticipated that the patient will be medically stable for discharge  from the hospital within 2 midnights of admission.   * I certify that at the point of admission it is my clinical judgment that the patient will require inpatient hospital care spanning beyond 2 midnights from the point of admission due to high intensity of service, high risk for further deterioration and high frequency of surveillance required.*  Author: Nolberto Hanlon, MD 01/16/2023 11:13 PM  For on call review [email or web address].

## 2023-01-17 ENCOUNTER — Encounter: Payer: Self-pay | Admitting: Internal Medicine

## 2023-01-17 ENCOUNTER — Other Ambulatory Visit: Payer: Self-pay

## 2023-01-17 ENCOUNTER — Encounter: Payer: Medicare Other | Admitting: Physical Therapy

## 2023-01-17 ENCOUNTER — Observation Stay (HOSPITAL_COMMUNITY)
Admit: 2023-01-17 | Discharge: 2023-01-17 | Disposition: A | Discharge status: Home / Self Care | Payer: Medicare Other | Attending: Internal Medicine | Admitting: Internal Medicine

## 2023-01-17 DIAGNOSIS — Z89511 Acquired absence of right leg below knee: Secondary | ICD-10-CM | POA: Diagnosis not present

## 2023-01-17 DIAGNOSIS — E669 Obesity, unspecified: Secondary | ICD-10-CM | POA: Diagnosis present

## 2023-01-17 DIAGNOSIS — B348 Other viral infections of unspecified site: Secondary | ICD-10-CM

## 2023-01-17 DIAGNOSIS — R0603 Acute respiratory distress: Secondary | ICD-10-CM

## 2023-01-17 DIAGNOSIS — E1151 Type 2 diabetes mellitus with diabetic peripheral angiopathy without gangrene: Secondary | ICD-10-CM | POA: Diagnosis present

## 2023-01-17 DIAGNOSIS — R531 Weakness: Secondary | ICD-10-CM | POA: Diagnosis not present

## 2023-01-17 DIAGNOSIS — I480 Paroxysmal atrial fibrillation: Secondary | ICD-10-CM | POA: Diagnosis present

## 2023-01-17 DIAGNOSIS — I132 Hypertensive heart and chronic kidney disease with heart failure and with stage 5 chronic kidney disease, or end stage renal disease: Secondary | ICD-10-CM | POA: Diagnosis present

## 2023-01-17 DIAGNOSIS — N186 End stage renal disease: Secondary | ICD-10-CM | POA: Diagnosis present

## 2023-01-17 DIAGNOSIS — R7401 Elevation of levels of liver transaminase levels: Secondary | ICD-10-CM | POA: Diagnosis present

## 2023-01-17 DIAGNOSIS — I509 Heart failure, unspecified: Secondary | ICD-10-CM

## 2023-01-17 DIAGNOSIS — J90 Pleural effusion, not elsewhere classified: Secondary | ICD-10-CM | POA: Diagnosis not present

## 2023-01-17 DIAGNOSIS — Z89512 Acquired absence of left leg below knee: Secondary | ICD-10-CM | POA: Diagnosis not present

## 2023-01-17 DIAGNOSIS — K7460 Unspecified cirrhosis of liver: Secondary | ICD-10-CM | POA: Diagnosis present

## 2023-01-17 DIAGNOSIS — B9789 Other viral agents as the cause of diseases classified elsewhere: Secondary | ICD-10-CM | POA: Diagnosis present

## 2023-01-17 DIAGNOSIS — Z1152 Encounter for screening for COVID-19: Secondary | ICD-10-CM | POA: Diagnosis not present

## 2023-01-17 DIAGNOSIS — I5021 Acute systolic (congestive) heart failure: Secondary | ICD-10-CM | POA: Diagnosis not present

## 2023-01-17 DIAGNOSIS — R7989 Other specified abnormal findings of blood chemistry: Secondary | ICD-10-CM | POA: Diagnosis not present

## 2023-01-17 DIAGNOSIS — I5022 Chronic systolic (congestive) heart failure: Secondary | ICD-10-CM | POA: Diagnosis present

## 2023-01-17 DIAGNOSIS — R5383 Other fatigue: Secondary | ICD-10-CM | POA: Diagnosis present

## 2023-01-17 DIAGNOSIS — D631 Anemia in chronic kidney disease: Secondary | ICD-10-CM | POA: Diagnosis present

## 2023-01-17 DIAGNOSIS — E785 Hyperlipidemia, unspecified: Secondary | ICD-10-CM

## 2023-01-17 DIAGNOSIS — Z992 Dependence on renal dialysis: Secondary | ICD-10-CM | POA: Diagnosis not present

## 2023-01-17 DIAGNOSIS — N2581 Secondary hyperparathyroidism of renal origin: Secondary | ICD-10-CM | POA: Diagnosis present

## 2023-01-17 DIAGNOSIS — E872 Acidosis, unspecified: Secondary | ICD-10-CM | POA: Diagnosis present

## 2023-01-17 DIAGNOSIS — I420 Dilated cardiomyopathy: Secondary | ICD-10-CM

## 2023-01-17 DIAGNOSIS — F419 Anxiety disorder, unspecified: Secondary | ICD-10-CM | POA: Diagnosis present

## 2023-01-17 DIAGNOSIS — I69354 Hemiplegia and hemiparesis following cerebral infarction affecting left non-dominant side: Secondary | ICD-10-CM | POA: Diagnosis not present

## 2023-01-17 DIAGNOSIS — E1122 Type 2 diabetes mellitus with diabetic chronic kidney disease: Secondary | ICD-10-CM | POA: Diagnosis present

## 2023-01-17 DIAGNOSIS — E876 Hypokalemia: Secondary | ICD-10-CM | POA: Diagnosis not present

## 2023-01-17 LAB — RESPIRATORY PANEL BY PCR

## 2023-01-17 LAB — CBC
HCT: 31 % — ABNORMAL LOW (ref 36.0–46.0)
Hemoglobin: 9.6 g/dL — ABNORMAL LOW (ref 12.0–15.0)
MCH: 30.2 pg (ref 26.0–34.0)
MCHC: 31 g/dL (ref 30.0–36.0)
MCV: 97.5 fL (ref 80.0–100.0)
Platelets: 61 10*3/uL — ABNORMAL LOW (ref 150–400)
RBC: 3.18 MIL/uL — ABNORMAL LOW (ref 3.87–5.11)
RDW: 17.7 % — ABNORMAL HIGH (ref 11.5–15.5)
WBC: 6.5 10*3/uL (ref 4.0–10.5)
nRBC: 0.5 % — ABNORMAL HIGH (ref 0.0–0.2)

## 2023-01-17 LAB — ECHOCARDIOGRAM COMPLETE
AR max vel: 0.97 cm2
AV Area VTI: 0.96 cm2
AV Area mean vel: 0.82 cm2
AV Mean grad: 10 mmHg
AV Peak grad: 17 mmHg
Ao pk vel: 2.06 m/s
Area-P 1/2: 5.75 cm2
Height: 69 in
MV VTI: 1.03 cm2
S' Lateral: 4.8 cm
Single Plane A4C EF: 0.9 %
Weight: 3264 oz

## 2023-01-17 LAB — HEPATIC FUNCTION PANEL
ALT: 154 U/L — ABNORMAL HIGH (ref 0–44)
AST: 85 U/L — ABNORMAL HIGH (ref 15–41)
Albumin: 3.6 g/dL (ref 3.5–5.0)
Alkaline Phosphatase: 214 U/L — ABNORMAL HIGH (ref 38–126)
Bilirubin, Direct: 1 mg/dL — ABNORMAL HIGH (ref 0.0–0.2)
Indirect Bilirubin: 2.3 mg/dL — ABNORMAL HIGH (ref 0.3–0.9)
Total Bilirubin: 3.3 mg/dL — ABNORMAL HIGH (ref 0.3–1.2)
Total Protein: 6.8 g/dL (ref 6.5–8.1)

## 2023-01-17 LAB — TROPONIN I (HIGH SENSITIVITY)
Troponin I (High Sensitivity): 128 ng/L (ref <18)
Troponin I (High Sensitivity): 127 ng/L (ref <18)

## 2023-01-17 LAB — CULTURE, BLOOD (ROUTINE X 2): Special Requests: ADEQUATE

## 2023-01-17 LAB — SARS CORONAVIRUS 2 BY RT PCR: SARS Coronavirus 2 by RT PCR: NEGATIVE

## 2023-01-17 LAB — BASIC METABOLIC PANEL
Anion gap: 9 (ref 5–15)
BUN: 21 mg/dL — ABNORMAL HIGH (ref 6–20)
CO2: 28 mmol/L (ref 22–32)
Calcium: 8.5 mg/dL — ABNORMAL LOW (ref 8.9–10.3)
Chloride: 98 mmol/L (ref 98–111)
Creatinine, Ser: 4.98 mg/dL — ABNORMAL HIGH (ref 0.44–1.00)
GFR, Estimated: 10 mL/min — ABNORMAL LOW (ref >60)
Glucose, Bld: 135 mg/dL — ABNORMAL HIGH (ref 70–99)
Potassium: 4 mmol/L (ref 3.5–5.1)
Sodium: 135 mmol/L (ref 135–145)

## 2023-01-17 LAB — HEPATITIS B SURFACE ANTIGEN: Hepatitis B Surface Ag: NONREACTIVE

## 2023-01-17 LAB — SEDIMENTATION RATE: Sed Rate: 25 mm/hr (ref 0–30)

## 2023-01-17 LAB — PHOSPHORUS: Phosphorus: 3.4 mg/dL (ref 2.5–4.6)

## 2023-01-17 LAB — CORTISOL-AM, BLOOD: Cortisol - AM: 15.9 ug/dL (ref 6.7–22.6)

## 2023-01-17 LAB — PROCALCITONIN: Procalcitonin: 0.68 ng/mL

## 2023-01-17 LAB — GLUCOSE, CAPILLARY: Glucose-Capillary: 149 mg/dL — ABNORMAL HIGH (ref 70–99)

## 2023-01-17 MED ORDER — PENTAFLUOROPROP-TETRAFLUOROETH EX AERO: 1.0000 | INHALATION_SPRAY | CUTANEOUS | Status: DC | PRN

## 2023-01-17 MED ORDER — SODIUM CHLORIDE 0.9 % IV SOLN
500.0000 mg | INTRAVENOUS | Status: DC
  Administered 2023-01-18 (×2): 500 mg via INTRAVENOUS
  Filled 2023-01-17 (×2): qty 5

## 2023-01-17 MED ORDER — HYDROCOD POLI-CHLORPHE POLI ER 10-8 MG/5ML PO SUER
5.0000 mL | Freq: Two times a day (BID) | ORAL | Status: DC | PRN
  Administered 2023-01-17: 5 mL via ORAL
  Filled 2023-01-17: qty 5

## 2023-01-17 MED ORDER — GUAIFENESIN ER 600 MG PO TB12
600.0000 mg | ORAL_TABLET | Freq: Two times a day (BID) | ORAL | Status: DC
  Administered 2023-01-17 – 2023-01-20 (×6): 600 mg via ORAL
  Filled 2023-01-17 (×6): qty 1

## 2023-01-17 MED ORDER — MIDODRINE HCL 5 MG PO TABS
5.0000 mg | ORAL_TABLET | Freq: Three times a day (TID) | ORAL | Status: DC
  Administered 2023-01-17 – 2023-01-20 (×8): 5 mg via ORAL
  Filled 2023-01-17 (×8): qty 1

## 2023-01-17 MED ORDER — ANTICOAGULANT SODIUM CITRATE 4% (200MG/5ML) IV SOLN: 5.0000 mL | Status: DC | PRN

## 2023-01-17 MED ORDER — ENSURE ENLIVE PO LIQD
237.0000 mL | Freq: Two times a day (BID) | ORAL | Status: DC
  Administered 2023-01-19 – 2023-01-20 (×2): 237 mL via ORAL

## 2023-01-17 MED ORDER — ONDANSETRON HCL 4 MG/2ML IJ SOLN: 4.0000 mg | Freq: Four times a day (QID) | INTRAMUSCULAR | Status: DC | PRN

## 2023-01-17 MED ORDER — HYDROXYZINE HCL 10 MG PO TABS
10.0000 mg | ORAL_TABLET | Freq: Three times a day (TID) | ORAL | Status: DC
  Administered 2023-01-17 – 2023-01-20 (×9): 10 mg via ORAL
  Filled 2023-01-17 (×11): qty 1

## 2023-01-17 MED ORDER — CHLORHEXIDINE GLUCONATE CLOTH 2 % EX PADS
6.0000 | MEDICATED_PAD | Freq: Every day | CUTANEOUS | Status: DC
  Administered 2023-01-18 – 2023-01-20 (×3): 6 via TOPICAL

## 2023-01-17 MED ORDER — ACETAMINOPHEN 650 MG RE SUPP: 650.0000 mg | Freq: Four times a day (QID) | RECTAL | Status: DC | PRN

## 2023-01-17 MED ORDER — LACTULOSE 10 GM/15ML PO SOLN: 20.0000 g | Freq: Every day | ORAL | Status: DC | PRN

## 2023-01-17 MED ORDER — SODIUM CHLORIDE 0.9% FLUSH
3.0000 mL | Freq: Two times a day (BID) | INTRAVENOUS | Status: DC
  Administered 2023-01-17 – 2023-01-20 (×7): 3 mL via INTRAVENOUS

## 2023-01-17 MED ORDER — METOPROLOL SUCCINATE ER 25 MG PO TB24
25.0000 mg | ORAL_TABLET | Freq: Every day | ORAL | Status: DC
  Administered 2023-01-17 – 2023-01-20 (×4): 25 mg via ORAL
  Filled 2023-01-17 (×4): qty 1

## 2023-01-17 MED ORDER — LIDOCAINE HCL (PF) 1 % IJ SOLN: 5.0000 mL | INTRAMUSCULAR | Status: DC | PRN

## 2023-01-17 MED ORDER — TRAZODONE HCL 50 MG PO TABS: 50.0000 mg | ORAL_TABLET | Freq: Every evening | ORAL | Status: DC | PRN

## 2023-01-17 MED ORDER — LIDOCAINE-PRILOCAINE 2.5-2.5 % EX CREA: 1.0000 | TOPICAL_CREAM | CUTANEOUS | Status: DC | PRN

## 2023-01-17 MED ORDER — BENZONATATE 100 MG PO CAPS
200.0000 mg | ORAL_CAPSULE | Freq: Three times a day (TID) | ORAL | Status: DC
  Administered 2023-01-17 – 2023-01-20 (×9): 200 mg via ORAL
  Filled 2023-01-17 (×9): qty 2

## 2023-01-17 MED ORDER — RENA-VITE PO TABS
1.0000 | ORAL_TABLET | Freq: Every day | ORAL | Status: DC
  Administered 2023-01-17 – 2023-01-19 (×3): 1 via ORAL
  Filled 2023-01-17 (×3): qty 1

## 2023-01-17 MED ORDER — ACETAMINOPHEN 325 MG PO TABS: 650.0000 mg | ORAL_TABLET | Freq: Four times a day (QID) | ORAL | Status: DC | PRN

## 2023-01-17 MED ORDER — ALTEPLASE 2 MG IJ SOLR: 2.0000 mg | Freq: Once | INTRAMUSCULAR | Status: DC | PRN

## 2023-01-17 MED ORDER — APIXABAN 2.5 MG PO TABS
2.5000 mg | ORAL_TABLET | Freq: Two times a day (BID) | ORAL | Status: DC
  Administered 2023-01-17 (×2): 2.5 mg via ORAL
  Filled 2023-01-17 (×2): qty 1

## 2023-01-17 MED ORDER — ALPRAZOLAM 0.25 MG PO TABS
0.2500 mg | ORAL_TABLET | Freq: Three times a day (TID) | ORAL | Status: DC | PRN
  Administered 2023-01-17 – 2023-01-18 (×2): 0.25 mg via ORAL
  Filled 2023-01-17 (×2): qty 1

## 2023-01-17 MED ORDER — HEPARIN SODIUM (PORCINE) 1000 UNIT/ML DIALYSIS: 1000.0000 [IU] | INTRAMUSCULAR | Status: DC | PRN

## 2023-01-17 NOTE — Progress Notes (Signed)
*  PRELIMINARY RESULTS* Echocardiogram 2D Echocardiogram has been performed.  Cristela Blue 01/17/2023, 7:52 AM

## 2023-01-17 NOTE — Progress Notes (Signed)
PROGRESS NOTE    Rylah Hemple  WNU:[id number] DOB: [birth year] DOA: 01/16/2023 PCP: Reubin Milan, MD    Brief Narrative:  57 y.o. female with medical history significant of ESRD on HD . Patient was inher usual state ofheathl till about early may. Since that time patinet reports marked fatigue as a major complaint. It has been interefiring with her acitivites of daily living. There has been no persistent fever, or rash or vomitng or diahrea. Paitent has also been complaining of sob. [facility] with diagnosis of " hypoxia" possibly due to a known chornic pleural effuion and discharged. However, patient has conitnued to have this complaint of fatigue. Curently she returns to ER with 4 days of cough with clear sputum and worsening sensation of sob and fatigue. There is no chest pain   Patient positive for rhinovirus   Assessment & Plan:   Principal Problem:   Pleural effusion on right Active Problems:   Abnormal LFTs   ESRD (end stage renal disease) on dialysis (HCC)   PAF (paroxysmal atrial fibrillation) (HCC)   Type II diabetes mellitus with complication (HCC)   Fatigue   Pleural effusion  Progressive fatigue Dyspnea on exertion Patient presented to the ED 3 times recently for similar complaints.  Unclear etiology.  Likely multifactorial.  Significant coronary atherosclerosis noted on CAT scan.  Possibility of underlying ischemic disease/angina Plan: Cardiology consult.  Consideration for inpatient ischemic evaluation.  Catheterization versus nuclear stress test.  Rhinovirus infection Right pleural effusion Small effusion on CAT scan.  No current indication for thoracentesis.  No clinical indication of bacterial pneumonia.  COVID-negative.  RVP positive for rhinovirus Plan: As this effusion is small and chronic no indication for thoracentesis Continue azithromycin for anti-inflammatory effect, 3 doses Discontinue ceftriaxone Droplet isolation  Abnormal  LFTs This is chronic.  Infectious workup has previously been done.  Patient may have cirrhosis based on the liver ultrasound that we have on file from May 20.  Outpatient follow-up   ESRD (end stage renal disease) on dialysis [facility]) Nephrology consulted for inpatient hemodialysis Last dialysis Tuesday 6/4 No indication for emergent dialysis   PAF (paroxysmal atrial fibrillation) (HCC) Can resume Eliquis Continue midodrine and metoprolol per last cardiology recommendations  Type II diabetes mellitus with complication (HCC) Diet controlled  Status post bilateral lower extremity amputations No acute issues   DVT prophylaxis: Eliquis Code Status: Full Family Communication: Disposition Plan: Status is: Observation The patient will require care spanning > 2 midnights and should be moved to inpatient because: Symptomatic fatigue, functional decline.  Possible angina.  Cardiology consulted.  Workup in progress.   Level of care: Med-Surg  Consultants:  Cardiology  Procedures:  None  Antimicrobials: Azithromycin    Subjective: patient seen and examined.  Resting in bed.  Endorses cough and fatigue associated with shortness of breath  Objective: Vitals:   01/17/23 0019 01/17/23 0113 01/17/23 0505 01/17/23 0821  BP: 103/81  138/86 127/84  Pulse: 78  78 81  Resp:   18 19  Temp: 97.9 F (36.6 C)  98.5 F (36.9 C) 99 F (37.2 C)  TempSrc:   Oral Oral  SpO2: 91%  100% 96%  Weight:  92.5 kg    Height:  5\' 9"  (1.753 m)      Intake/Output Summary (Last 24 hours) at 01/17/2023 1148 Last data filed at 01/16/2023 2313 Gross per 24 hour  Intake 96.67 ml  Output --  Net 96.67 ml   [employer]  01/16/23 1828 01/17/23 0113  Weight: 92.5 kg 92.5 kg    Examination:  General exam: Appears fatigued Respiratory system: Clear to auscultation. Respiratory effort normal. Cardiovascular system: S1-S2, RRR, no murmurs Gastrointestinal system: Soft, NT/ND, normal bowel  sounds Central nervous system: Alert and oriented. No focal neurological deficits. Extremities: Bilateral lower Skin: No rashes, lesions or ulcers Psychiatry: Judgement and insight appear normal. Mood & affect appropriate.     Data Reviewed: I have personally reviewed following labs and imaging studies  CBC: Recent Labs  Lab 01/11/23 0216 01/16/23 1842 01/17/23 0335  WBC 7.3 7.8 6.5  NEUTROABS 4.5  --   --   HGB 9.9* 11.0* 9.6*  HCT 32.7* 36.1 31.0*  MCV 98.5 98.6 97.5  PLT 134* 63* 61*   Basic Metabolic Panel: Recent Labs  Lab 01/11/23 0216 01/16/23 1842 01/17/23 0335  NA 132* 134* 135  K 4.0 3.2* 4.0  CL 93* 92* 98  CO2 25 25 28   GLUCOSE 187* 155* 135*  BUN 40* 17 21*  CREATININE 6.94* 4.20* 4.98*  CALCIUM 8.8* 9.0 8.5*  PHOS  --   --  3.4   GFR: Estimated Creatinine Clearance: 15.1 mL/min (A) (by C-G formula based on SCr of 4.98 mg/dL (H)). Liver Function Tests: Recent Labs  Lab 01/11/23 0216 01/17/23 0335  AST 43* 85*  ALT 47* 154*  ALKPHOS 186* 214*  BILITOT 3.0* 3.3*  PROT 8.0 6.8  ALBUMIN 4.1 3.6   Recent Labs  Lab 01/11/23 0216  LIPASE 44   No results for input(s): "AMMONIA" in the last 168 hours. Coagulation Profile: No results for input(s): "INR", "PROTIME" in the last 168 hours. Cardiac Enzymes: No results for input(s): "CKTOTAL", "CKMB", "CKMBINDEX", "TROPONINI" in the last 168 hours. BNP (last 3 results) No results for input(s): "PROBNP" in the last 8760 hours. HbA1C: No results for input(s): "HGBA1C" in the last 72 hours. CBG: No results for input(s): "GLUCAP" in the last 168 hours. Lipid Profile: No results for input(s): "CHOL", "HDL", "LDLCALC", "TRIG", "CHOLHDL", "LDLDIRECT" in the last 72 hours. Thyroid Function Tests: No results for input(s): "TSH", "T4TOTAL", "FREET4", "T3FREE", "THYROIDAB" in the last 72 hours. Anemia Panel: No results for input(s): "VITAMINB12", "FOLATE", "FERRITIN", "TIBC", "IRON", "RETICCTPCT" in the  last 72 hours. Sepsis Labs: Recent Labs  Lab 01/16/23 1841 01/16/23 2041 01/17/23 0909  PROCALCITON  --   --  0.68  LATICACIDVEN 2.5* 2.8*  --     Recent Results (from the past 240 hour(s))  Blood Culture (routine x 2)     Status: None (Preliminary result)   Collection Time: 01/16/23  6:41 PM   Specimen: BLOOD  Result Value Ref Range Status   Specimen Description BLOOD BLOOD RIGHT ARM  Final   Special Requests   Final    BOTTLES DRAWN AEROBIC AND ANAEROBIC Blood Culture adequate volume   Culture   Final    NO GROWTH < 12 HOURS Performed at [facility], [address] [place], [place] [address]    Report Status PENDING  Incomplete  Blood Culture (routine x 2)     Status: None (Preliminary result)   Collection Time: 01/16/23 10:39 PM   Specimen: BLOOD  Result Value Ref Range Status   Specimen Description BLOOD BLOOD RIGHT FOREARM  Final   Special Requests   Final    BOTTLES DRAWN AEROBIC AND ANAEROBIC Blood Culture adequate volume   Culture   Final    NO GROWTH < 12 HOURS Performed at [facility],  [address], [place], [place] [address]    Report Status PENDING  Incomplete  Respiratory (~20 pathogens) panel by PCR     Status: Abnormal   Collection Time: 01/16/23 11:28 PM   Specimen: Nasopharyngeal Swab; Respiratory  Result Value Ref Range Status   Adenovirus NOT DETECTED NOT DETECTED Final   Coronavirus 229E NOT DETECTED NOT DETECTED Final    Comment: (NOTE) The Coronavirus on the Respiratory Panel, DOES NOT test for the novel  Coronavirus (2019 nCoV)    Coronavirus HKU1 NOT DETECTED NOT DETECTED Final   Coronavirus NL63 NOT DETECTED NOT DETECTED Final   Coronavirus OC43 NOT DETECTED NOT DETECTED Final   Metapneumovirus NOT DETECTED NOT DETECTED Final   Rhinovirus / Enterovirus DETECTED (A) NOT DETECTED Final   Influenza A NOT DETECTED NOT DETECTED Final   Influenza B NOT DETECTED NOT DETECTED Final   Parainfluenza Virus 1 NOT DETECTED NOT  DETECTED Final   Parainfluenza Virus 2 NOT DETECTED NOT DETECTED Final   Parainfluenza Virus 3 NOT DETECTED NOT DETECTED Final   Parainfluenza Virus 4 NOT DETECTED NOT DETECTED Final   Respiratory Syncytial Virus NOT DETECTED NOT DETECTED Final   Bordetella pertussis NOT DETECTED NOT DETECTED Final   Bordetella Parapertussis NOT DETECTED NOT DETECTED Final   Chlamydophila pneumoniae NOT DETECTED NOT DETECTED Final   Mycoplasma pneumoniae NOT DETECTED NOT DETECTED Final    Comment: Performed at [facility], [address] [place], [place] [address]  SARS Coronavirus 2 by RT PCR (hospital order, performed in [facility] hospital lab) *cepheid single result test* Anterior Nasal Swab     Status: None   Collection Time: 01/17/23 12:43 AM   Specimen: Anterior Nasal Swab  Result Value Ref Range Status   SARS Coronavirus 2 by RT PCR NEGATIVE NEGATIVE Final    Comment: (NOTE) SARS-CoV-2 target nucleic acids are NOT DETECTED.  The SARS-CoV-2 RNA is generally detectable in upper and lower respiratory specimens during the acute phase of infection. The lowest concentration of SARS-CoV-2 viral copies this assay can detect is 250 copies / mL. A negative result does not preclude SARS-CoV-2 infection and should not be used as the sole basis for treatment or other patient management decisions.  A negative result may occur with improper specimen collection / handling, submission of specimen other than nasopharyngeal swab, presence of viral mutation(s) within the areas targeted by this assay, and inadequate number of viral copies (<250 copies / mL). A negative result must be combined with clinical observations, patient history, and epidemiological information.  Fact Sheet for Patients:   RoadLapTop.co.za  Fact Sheet for Healthcare Providers: http://kim-miller.com/  This test is not yet approved or  cleared by the [place] FDA and has been  authorized for detection and/or diagnosis of SARS-CoV-2 by FDA under an Emergency Use Authorization (EUA).  This EUA will remain in effect (meaning this test can be used) for the duration of the COVID-19 declaration under Section 564(b)(1) of the Act, 21 U.S.C. section 360bbb-3(b)(1), unless the authorization is terminated or revoked sooner.  Performed at [facility], [address], [place], [place] [address]          Radiology Studies: CT CHEST WO CONTRAST  Result Date: 01/16/2023 CLINICAL DATA:  Pleural effusion, known or suspected. Weakness beginning on Sunday. EXAM: CT CHEST WITHOUT CONTRAST TECHNIQUE: Multidetector CT imaging of the chest was performed following the standard protocol without IV contrast. RADIATION DOSE REDUCTION: This exam was performed according to the departmental dose-optimization  program which includes automated exposure control, adjustment of the mA and/or kV according to patient size and/or use of iterative reconstruction technique. COMPARISON:  Chest radiograph 01/16/2023.  CT 05/30/2022 FINDINGS: Cardiovascular: Cardiac enlargement. No pericardial effusions. Calcification of the coronary arteries and aorta. No aortic aneurysm. Mediastinum/Nodes: Thyroid gland is unremarkable. Esophagus is decompressed. Mediastinal lymph nodes are prominent, with right paratracheal nodes measuring up to about 12 mm short axis dimension. Lymph nodes are similar to prior study, most likely reactive. Lungs/Pleura: Small right pleural effusion is decreased since previous study. Atelectasis or consolidation in the right lung base. Scattered emphysematous changes in the lungs. No pneumothorax. Upper Abdomen: No acute abnormalities demonstrated. Severe and diffuse vascular calcifications throughout the upper abdomen. Musculoskeletal: Degenerative changes in the spine. IMPRESSION: 1. Small right pleural effusion with consolidation or atelectasis in the right lung base. Mild  improvement since prior CT. For 2 diffuse cardiac enlargement. 2. Aortic atherosclerosis with severe vascular calcification throughout the chest and abdomen. 3. Stable appearance of mildly enlarged mediastinal lymph nodes, likely reactive. 4. Emphysematous changes in the lungs. Electronically Signed   By: Burman Nieves M.D.   On: 01/16/2023 23:46   DG Chest Port 1 View  Result Date: 01/16/2023 CLINICAL DATA:  Weakness and Saturday EXAM: PORTABLE CHEST 1 VIEW COMPARISON:  Radiographs 01/11/2023 FINDINGS: Stable cardiomegaly. Airspace and interstitial opacities in the right mid and lower lung. Moderate right pleural effusion. The left lung is clear. No pneumothorax. No displaced rib fractures. IMPRESSION: Airspace and interstitial opacities in the right mid and lower lung with moderate right pleural effusion similar to prior. Cardiomegaly. Electronically Signed   By: Minerva Fester M.D.   On: 01/16/2023 19:20        Scheduled Meds:  benzonatate  200 mg Oral TID   guaiFENesin  600 mg Oral BID   hydrOXYzine  10 mg Oral TID   metoprolol succinate  25 mg Oral Daily   midodrine  5 mg Oral TID with meals   multivitamin  1 tablet Oral QHS   sodium chloride flush  3 mL Intravenous Q12H   Continuous Infusions:  azithromycin     cefTRIAXone (ROCEPHIN)  IV Stopped (01/16/23 2313)     LOS: 0 days    Tresa Moore, MD [facility]   If 7PM-7AM, please contact night-coverage  01/17/2023, 11:48 AM

## 2023-01-17 NOTE — Progress Notes (Signed)
Initial Nutrition Assessment  DOCUMENTATION CODES:   Obesity unspecified  INTERVENTION:   -Renal MVI daily -Magic cup BID with meals, each supplement provides 290 kcal and 9 grams of protein  -Liberalize diet to regular for widest variety of meal selections  NUTRITION DIAGNOSIS:   Increased nutrient needs related to chronic illness (ESRD on HD) as evidenced by estimated needs.  GOAL:   Patient will meet greater than or equal to 90% of their needs  MONITOR:   PO intake, Supplement acceptance  REASON FOR ASSESSMENT:   Malnutrition Screening Tool    ASSESSMENT:   Pt with medical history significant of ESRD on HD who was admitted with complaints of weakness.  Pt admitted with rt pleural effusion.   Reviewed I/O's: +97 ml x 24 hours  Noted pt is a bilateral BKA and has access to prosthetic legs and wheelchair.   Spoke with pt at bedside, who was talking on phone at time of visit.  Pt shares that her appetite has improvoied since admission and is looking forward to eating two boiled eggs and oatmeal for breakfast. PTA pt reports that she was unable to keep foods and liquids down for 2 weeks. If she tried to eat, foods and liquids "would come right back up". Prior to this, pt reports she was consuming 2 meals per day on both HD and non-HD days, which consist of breakfast foods such as eggs and oatmeal and meat, starch and vegetable or sandwich. Pt reports that she does not take any supplements or vitamins for HD other than a binder. Pt reports she does not drink much, as she tries to be mindful of her fluid intake secondary to HD.   Per pt, her UBW is around 204# and estimates that she has lsot about 10-20# over the past 2 weeks. Pt shares that her dry weight has decreased from 90 kg to 80 kg. Pt shares that she "has been working to keep her dry weight down". Reviewed wt hx; pt has experienced a 1.5% wt loss over the past month, which is not significant for time frame.   Discussed  importance of good meal and supplement intake to promote healing.   Medications reviewed.   Lab Results  Component Value Date   HGBA1C 5.3 01/01/2023   PTA DM medications are none.   Labs reviewed: K, Mg, and Phos WDL. CBGS: 150 (inpatient orders for glycemic control are none).    NUTRITION - FOCUSED PHYSICAL EXAM:  Flowsheet Row Most Recent Value  Orbital Region No depletion  Upper Arm Region No depletion  Thoracic and Lumbar Region No depletion  Buccal Region No depletion  Temple Region No depletion  Clavicle Bone Region No depletion  Clavicle and Acromion Bone Region No depletion  Scapular Bone Region No depletion  Dorsal Hand No depletion  Patellar Region No depletion  Anterior Thigh Region No depletion  Posterior Calf Region Unable to assess  Edema (RD Assessment) Mild  Hair Reviewed  Eyes Reviewed  Mouth Reviewed  Skin Reviewed  Nails Reviewed       Diet Order:   Diet Order             Diet Carb Modified Fluid consistency: Thin; Room service appropriate? Yes  Diet effective now                   EDUCATION NEEDS:   Education needs have been addressed  Skin:  Skin Assessment: Reviewed RN Assessment  Last BM:  Unknown  Height:  Ht Readings from Last 1 Encounters:  01/17/23 5\' 9"  (1.753 m)    Weight:   Wt Readings from Last 1 Encounters:  01/17/23 92.5 kg    Ideal Body Weight:  57.3 kg (adjusted for bilateral BKA)  BMI:  Body mass index is 30.13 kg/m.  Estimated Nutritional Needs:   Kcal:  1700-1900  Protein:  85-100 grams  Fluid:  1000 ml + UOP    Levada Schilling, RD, LDN, CDCES Registered Dietitian II Certified Diabetes Care and Education Specialist Please refer to [facility] for RD and/or RD on-call/weekend/after hours pager

## 2023-01-17 NOTE — Discharge Planning (Signed)
ESTBLISHED HEMODIALYSIS Outpatient Facility  [facility]  [address]  [place] [address] [phone]  Scheduled days: Tuesday Thursday and Saturday  Treatment time: 11:30am

## 2023-01-17 NOTE — Progress Notes (Signed)
[facility]  ROUNDING NOTE   Subjective:   Cindy Osborne is a 57 y.o. female with past medical history including hypertension, hyperlipidemia, stroke, diabetes, and end stage renal disease on ;hemodialysis. Patient presented to the ED with prolonged fatigue and has been admitted for Pleural effusion [J90] CHF (congestive heart failure) (HCC) [I50.9]  Patient is known to our practice and receives outpatient dialysis treatments at [facility] on a TTS schedule, supervised by Dr. Wynelle Link.  Patient has been on dialysis for approximately 1 year.  Last dialysis treatment completed yesterday, no complications.  Patient denies fever or chills.  Denies any nausea, or vomiting.  Nonproductive cough and shortness of breath present.  No recent concerns of diarrhea.  Patient voices concerns having too much dialysis.  Also reports poor oral intake.  Labs on ED arrival significant for sodium 134, potassium 3.2, glucose 155, creatinine 4.20 with GFR 12, lactic acid 2.5, and hemoglobin 11.0.  Chest x-ray shows right mid and lower lung moderate pleural effusion.  CT chest shows a small right pleural effusion.  We have been consulted to manage dialysis needs during this admission.  Objective:  Vital signs in last 24 hours:  Temp:  [97.7 F (36.5 C)-99 F (37.2 C)] 97.8 F (36.6 C) (06/05 1628) Pulse Rate:  [71-84] 71 (06/05 1628) Resp:  [13-20] 17 (06/05 1628) BP: (103-138)/(81-90) 123/83 (06/05 1628) SpO2:  [91 %-100 %] 96 % (06/05 1628) Weight:  [92.5 kg] 92.5 kg (06/05 0113)  Weight change:  Filed Weights   01/16/23 1828 01/17/23 0113  Weight: 92.5 kg 92.5 kg    Intake/Output: I/O last 3 completed shifts: In: 96.7 [IV Piggyback:96.7] Out: -    Intake/Output this shift:  No intake/output data recorded.  Physical Exam: General: NAD  Head: Normocephalic, atraumatic. Moist oral mucosal membranes  Eyes: Anicteric  Neck: Supple, trachea midline  Lungs:  Clear to  auscultation, normal effort  Heart: Regular rate and rhythm  Abdomen:  Soft, nontender,   Extremities: No peripheral edema.  Neurologic: Nonfocal, moving all four extremities  Skin: No lesions  Access: Left aVF    Basic Metabolic Panel: Recent Labs  Lab 01/11/23 0216 01/16/23 1842 01/17/23 0335  NA 132* 134* 135  K 4.0 3.2* 4.0  CL 93* 92* 98  CO2 25 25 28   GLUCOSE 187* 155* 135*  BUN 40* 17 21*  CREATININE 6.94* 4.20* 4.98*  CALCIUM 8.8* 9.0 8.5*  PHOS  --   --  3.4    Liver Function Tests: Recent Labs  Lab 01/11/23 0216 01/17/23 0335  AST 43* 85*  ALT 47* 154*  ALKPHOS 186* 214*  BILITOT 3.0* 3.3*  PROT 8.0 6.8  ALBUMIN 4.1 3.6   Recent Labs  Lab 01/11/23 0216  LIPASE 44   No results for input(s): "AMMONIA" in the last 168 hours.  CBC: Recent Labs  Lab 01/11/23 0216 01/16/23 1842 01/17/23 0335  WBC 7.3 7.8 6.5  NEUTROABS 4.5  --   --   HGB 9.9* 11.0* 9.6*  HCT 32.7* 36.1 31.0*  MCV 98.5 98.6 97.5  PLT 134* 63* 61*    Cardiac Enzymes: No results for input(s): "CKTOTAL", "CKMB", "CKMBINDEX", "TROPONINI" in the last 168 hours.  BNP: Invalid input(s): "POCBNP"  CBG: Recent Labs  Lab 01/17/23 1220  GLUCAP 149*    Microbiology: Results for orders placed or performed during the hospital encounter of 01/16/23  Blood Culture (routine x 2)     Status: None (Preliminary result)   Collection Time:  01/16/23  6:41 PM   Specimen: BLOOD  Result Value Ref Range Status   Specimen Description BLOOD BLOOD RIGHT ARM  Final   Special Requests   Final    BOTTLES DRAWN AEROBIC AND ANAEROBIC Blood Culture adequate volume   Culture   Final    NO GROWTH < 12 HOURS Performed at [facility], [address] [place], [place] [address]    Report Status PENDING  Incomplete  Blood Culture (routine x 2)     Status: None (Preliminary result)   Collection Time: 01/16/23 10:39 PM   Specimen: BLOOD  Result Value Ref Range Status   Specimen Description  BLOOD BLOOD RIGHT FOREARM  Final   Special Requests   Final    BOTTLES DRAWN AEROBIC AND ANAEROBIC Blood Culture adequate volume   Culture   Final    NO GROWTH < 12 HOURS Performed at [facility], [address] [place], [place] [address]    Report Status PENDING  Incomplete  Respiratory (~20 pathogens) panel by PCR     Status: Abnormal   Collection Time: 01/16/23 11:28 PM   Specimen: Nasopharyngeal Swab; Respiratory  Result Value Ref Range Status   Adenovirus NOT DETECTED NOT DETECTED Final   Coronavirus 229E NOT DETECTED NOT DETECTED Final    Comment: (NOTE) The Coronavirus on the Respiratory Panel, DOES NOT test for the novel  Coronavirus (2019 nCoV)    Coronavirus HKU1 NOT DETECTED NOT DETECTED Final   Coronavirus NL63 NOT DETECTED NOT DETECTED Final   Coronavirus OC43 NOT DETECTED NOT DETECTED Final   Metapneumovirus NOT DETECTED NOT DETECTED Final   Rhinovirus / Enterovirus DETECTED (A) NOT DETECTED Final   Influenza A NOT DETECTED NOT DETECTED Final   Influenza B NOT DETECTED NOT DETECTED Final   Parainfluenza Virus 1 NOT DETECTED NOT DETECTED Final   Parainfluenza Virus 2 NOT DETECTED NOT DETECTED Final   Parainfluenza Virus 3 NOT DETECTED NOT DETECTED Final   Parainfluenza Virus 4 NOT DETECTED NOT DETECTED Final   Respiratory Syncytial Virus NOT DETECTED NOT DETECTED Final   Bordetella pertussis NOT DETECTED NOT DETECTED Final   Bordetella Parapertussis NOT DETECTED NOT DETECTED Final   Chlamydophila pneumoniae NOT DETECTED NOT DETECTED Final   Mycoplasma pneumoniae NOT DETECTED NOT DETECTED Final    Comment: Performed at [facility], [address], [place], [place] [address]  SARS Coronavirus 2 by RT PCR (hospital order, performed in [facility] hospital lab) *cepheid single result test* Anterior Nasal Swab     Status: None   Collection Time: 01/17/23 12:43 AM   Specimen: Anterior Nasal Swab  Result Value Ref Range Status   SARS Coronavirus 2 by RT  PCR NEGATIVE NEGATIVE Final    Comment: (NOTE) SARS-CoV-2 target nucleic acids are NOT DETECTED.  The SARS-CoV-2 RNA is generally detectable in upper and lower respiratory specimens during the acute phase of infection. The lowest concentration of SARS-CoV-2 viral copies this assay can detect is 250 copies / mL. A negative result does not preclude SARS-CoV-2 infection and should not be used as the sole basis for treatment or other patient management decisions.  A negative result may occur with improper specimen collection / handling, submission of specimen other than nasopharyngeal swab, presence of viral mutation(s) within the areas targeted by this assay, and inadequate number of viral copies (<250 copies / mL). A negative result must be combined with clinical observations, patient history, and epidemiological information.  Fact Sheet for Patients:  RoadLapTop.co.za  Fact Sheet for Healthcare Providers: http://kim-miller.com/  This test is not yet approved or  cleared by the [place] FDA and has been authorized for detection and/or diagnosis of SARS-CoV-2 by FDA under an Emergency Use Authorization (EUA).  This EUA will remain in effect (meaning this test can be used) for the duration of the COVID-19 declaration under Section 564(b)(1) of the Act, 21 U.S.C. section 360bbb-3(b)(1), unless the authorization is terminated or revoked sooner.  Performed at [facility], [address] [place], [place] [address]     Coagulation Studies: No results for input(s): "LABPROT", "INR" in the last 72 hours.  Urinalysis: No results for input(s): "COLORURINE", "LABSPEC", "PHURINE", "GLUCOSEU", "HGBUR", "BILIRUBINUR", "KETONESUR", "PROTEINUR", "UROBILINOGEN", "NITRITE", "LEUKOCYTESUR" in the last 72 hours.  Invalid input(s): "APPERANCEUR"    Imaging: ECHOCARDIOGRAM COMPLETE  Result Date: 01/17/2023    ECHOCARDIOGRAM REPORT    Patient Name:   Cindy Osborne Date of Exam: 01/17/2023 Medical Rec #:  [id number]         Height:       69.0 in Accession #:    [id number]        Weight:       204.0 lb Date of Birth:  01-06-1966          BSA:          2.083 m Patient Age:    56 years          BP:           138/86 mmHg Patient Gender: F                 HR:           78 bpm. Exam Location:  [facility] Procedure: 2D Echo, Cardiac Doppler and Color Doppler Indications:     CHF-acute systolic I50.21  History:         Patient has prior history of Echocardiogram examinations, most                  recent 01/22/2022. Stroke; Risk Factors:Dyslipidemia and                  Hypertension.  Sonographer:     Cristela Blue Referring Phys:  [id number] [facility] GOEL Diagnosing Phys: Debbe Odea MD IMPRESSIONS  1. Left ventricular ejection fraction, by estimation, is 35 to 40%. The left ventricle has moderately decreased function. The left ventricle has no regional wall motion abnormalities. There is mild left ventricular hypertrophy. Left ventricular diastolic parameters are consistent with Grade II diastolic dysfunction (pseudonormalization).  2. Right ventricular systolic function was not well visualized. The right ventricular size is not well visualized.  3. The mitral valve is degenerative. Moderate mitral valve regurgitation. Moderate mitral annular calcification.  4. The aortic valve is calcified. Aortic valve regurgitation is not visualized. FINDINGS  Left Ventricle: Left ventricular ejection fraction, by estimation, is 35 to 40%. The left ventricle has moderately decreased function. The left ventricle has no regional wall motion abnormalities. The left ventricular internal cavity size was normal in size. There is mild left ventricular hypertrophy. Left ventricular diastolic parameters are consistent with Grade II diastolic dysfunction (pseudonormalization). Right Ventricle: The right ventricular size is not well visualized. No increase in right ventricular wall  thickness. Right ventricular systolic function was not well visualized. Left Atrium: Left atrial size was normal in size. Right Atrium: Right atrial size was normal in size. Pericardium: There is no evidence of pericardial effusion. Mitral Valve: The mitral valve  is degenerative in appearance. Moderate mitral annular calcification. Moderate mitral valve regurgitation. MV peak gradient, 8.8 mmHg. The mean mitral valve gradient is 3.0 mmHg. Tricuspid Valve: The tricuspid valve is not well visualized. Tricuspid valve regurgitation is not demonstrated. Aortic Valve: The aortic valve is calcified. Aortic valve regurgitation is not visualized. Aortic valve mean gradient measures 10.0 mmHg. Aortic valve peak gradient measures 17.0 mmHg. Aortic valve area, by VTI measures 0.96 cm. Pulmonic Valve: The pulmonic valve was not well visualized. Pulmonic valve regurgitation is not visualized. Aorta: The aortic root is normal in size and structure. Venous: The inferior vena cava was not well visualized. IAS/Shunts: No atrial level shunt detected by color flow Doppler.  LEFT VENTRICLE PLAX 2D LVIDd:         5.60 cm      Diastology LVIDs:         4.80 cm      LV e' medial:    5.00 cm/s LV PW:         1.00 cm      LV E/e' medial:  31.6 LV IVS:        1.30 cm      LV e' lateral:   8.05 cm/s LVOT diam:     2.00 cm      LV E/e' lateral: 19.6 LV SV:         39 LV SV Index:   19 LVOT Area:     3.14 cm  LV Volumes (MOD) LV vol d, MOD A4C: 115.0 ml LV vol s, MOD A4C: 114.0 ml LV SV MOD A4C:     115.0 ml RIGHT VENTRICLE RV S prime:     9.68 cm/s TAPSE (M-mode): 1.8 cm LEFT ATRIUM             Index        RIGHT ATRIUM           Index LA diam:        4.60 cm 2.21 cm/m   RA Area:     19.60 cm LA Vol (A2C):   51.1 ml 24.53 ml/m  RA Volume:   55.50 ml  26.64 ml/m LA Vol (A4C):   72.1 ml 34.61 ml/m LA Biplane Vol: 65.2 ml 31.29 ml/m  AORTIC VALVE AV Area (Vmax):    0.97 cm AV Area (Vmean):   0.82 cm AV Area (VTI):     0.96 cm AV Vmax:            206.33 cm/s AV Vmean:          149.333 cm/s AV VTI:            0.410 m AV Peak Grad:      17.0 mmHg AV Mean Grad:      10.0 mmHg LVOT Vmax:         63.90 cm/s LVOT Vmean:        38.800 cm/s LVOT VTI:          0.125 m LVOT/AV VTI ratio: 0.30  AORTA Ao Root diam: 2.70 cm MITRAL VALVE MV Area (PHT): 5.75 cm     SHUNTS MV Area VTI:   1.03 cm     Systemic VTI:  0.12 m MV Peak grad:  8.8 mmHg     Systemic Diam: 2.00 cm MV Mean grad:  3.0 mmHg MV Vmax:       1.48 m/s MV Vmean:      79.3 cm/s MV Decel Time: 132 msec MV E velocity:  158.00 cm/s MV A velocity: 92.20 cm/s MV E/A ratio:  1.71 Debbe Odea MD Electronically signed by Debbe Odea MD Signature Date/Time: 01/17/2023/12:49:10 PM    Final    CT CHEST WO CONTRAST  Result Date: 01/16/2023 CLINICAL DATA:  Pleural effusion, known or suspected. Weakness beginning on Sunday. EXAM: CT CHEST WITHOUT CONTRAST TECHNIQUE: Multidetector CT imaging of the chest was performed following the standard protocol without IV contrast. RADIATION DOSE REDUCTION: This exam was performed according to the departmental dose-optimization program which includes automated exposure control, adjustment of the mA and/or kV according to patient size and/or use of iterative reconstruction technique. COMPARISON:  Chest radiograph 01/16/2023.  CT 05/30/2022 FINDINGS: Cardiovascular: Cardiac enlargement. No pericardial effusions. Calcification of the coronary arteries and aorta. No aortic aneurysm. Mediastinum/Nodes: Thyroid gland is unremarkable. Esophagus is decompressed. Mediastinal lymph nodes are prominent, with right paratracheal nodes measuring up to about 12 mm short axis dimension. Lymph nodes are similar to prior study, most likely reactive. Lungs/Pleura: Small right pleural effusion is decreased since previous study. Atelectasis or consolidation in the right lung base. Scattered emphysematous changes in the lungs. No pneumothorax. Upper Abdomen: No acute abnormalities  demonstrated. Severe and diffuse vascular calcifications throughout the upper abdomen. Musculoskeletal: Degenerative changes in the spine. IMPRESSION: 1. Small right pleural effusion with consolidation or atelectasis in the right lung base. Mild improvement since prior CT. For 2 diffuse cardiac enlargement. 2. Aortic atherosclerosis with severe vascular calcification throughout the chest and abdomen. 3. Stable appearance of mildly enlarged mediastinal lymph nodes, likely reactive. 4. Emphysematous changes in the lungs. Electronically Signed   By: Burman Nieves M.D.   On: 01/16/2023 23:46   DG Chest Port 1 View  Result Date: 01/16/2023 CLINICAL DATA:  Weakness and Saturday EXAM: PORTABLE CHEST 1 VIEW COMPARISON:  Radiographs 01/11/2023 FINDINGS: Stable cardiomegaly. Airspace and interstitial opacities in the right mid and lower lung. Moderate right pleural effusion. The left lung is clear. No pneumothorax. No displaced rib fractures. IMPRESSION: Airspace and interstitial opacities in the right mid and lower lung with moderate right pleural effusion similar to prior. Cardiomegaly. Electronically Signed   By: Minerva Fester M.D.   On: 01/16/2023 19:20     Medications:    anticoagulant sodium citrate     azithromycin      apixaban  2.5 mg Oral BID   benzonatate  200 mg Oral TID   [START ON 01/18/2023] Chlorhexidine Gluconate Cloth  6 each Topical Q0600   guaiFENesin  600 mg Oral BID   hydrOXYzine  10 mg Oral TID   metoprolol succinate  25 mg Oral Daily   midodrine  5 mg Oral TID with meals   multivitamin  1 tablet Oral QHS   sodium chloride flush  3 mL Intravenous Q12H   acetaminophen **OR** acetaminophen, ALPRAZolam, alteplase, anticoagulant sodium citrate, chlorpheniramine-HYDROcodone, heparin, lactulose, lidocaine (PF), lidocaine-prilocaine, ondansetron (ZOFRAN) IV, pentafluoroprop-tetrafluoroeth, traZODone  Assessment/ Plan:  Ms. Alysiah Varas is a 57 y.o.  female with past medical  history including hypertension, hyperlipidemia, stroke, diabetes, and end stage renal disease on ;hemodialysis. Patient presented to the ED with prolonged fatigue and has been admitted for Pleural effusion [J90] CHF (congestive heart failure) (HCC) [I50.9]  [facility]/TTS/ Lt AVF/ 90.0kg  End stage renal disease requiring hemodialysis. Last treatment completed on Tuesday. Will plan for dialysis Thursday. Hepatitis B panel ordered per protocol.   2. Anemia of chronic kidney disease Lab Results  Component Value Date   HGB 9.6 (L) 01/17/2023  Hgb within optimal range. No need for ESA outpatient.   3. Secondary Hyperparathyroidism: with outpatient labs: PTH 2564, phosphorus 6.2, calcium 9.0 on 12/19/22.   Lab Results  Component Value Date   CALCIUM 8.5 (L) 01/17/2023   CAION 0.90 (L) 05/27/2022   PHOS 3.4 01/17/2023    Will continue to monitor bone minerals during this admission. Patient prescribed Velphoro outpatient with meals  4. Diabetes mellitus type II with chronic kidney disease/renal manifestations: insulin dependent. Home regimen includes Humalog and Lantus. Most recent hemoglobin A1c is 5.9 on 10/17/22.     LOS: 0 Cindy Osborne 6/5/20246:19 PM

## 2023-01-17 NOTE — Consult Note (Signed)
Cardiology Consult    Patient ID: Cindy Osborne MRN: [id number], DOB/AGE: [birth year]   Admit date: 01/16/2023 Date of Consult: 01/17/2023  Primary Physician: Reubin Milan, MD Primary Cardiologist: Debbe Odea, MD Requesting Provider: Kathie Rhodes. Georgeann Oppenheim, MD  Patient Profile    Cindy Osborne is a 57 y.o. female with a history of paroxysmal atrial fibrillation, hypertension, hyperlipidemia, end-stage renal disease on hemodialysis, diabetes, stroke, R pleural effusion, and peripheral arterial disease status post bilateral BKAs who is being seen today for the evaluation of dyspnea and weakness in the setting of rhinovirus with finding of LV dysfunction at the request of Dr. Georgeann Oppenheim.  Past Medical History   Past Medical History:  Diagnosis Date   Acute ischemic right MCA stroke (HCC) 03/25/2022   Amputated toe (HCC) 01/31/2012   Right second toe distal phalanx Right great toe   Diabetes mellitus without complication (HCC)    ESRD (end stage renal disease) (HCC)    HFrEF (heart failure with reduced ejection fraction) (HCC)    a. 01/2022 Echo: EF 55-60%, grII DD; b. 01/2023 Echo: EF 35-40%, no rwma, mild LVH, GrII DD, mod MR.   History of diabetic ulcer of foot 04/01/2014   Hyperlipidemia    Hypertension    Moderate mitral regurgitation    a. 01/2022 Echo: mild-mod MR; b. 01/2023 Echo: Mod MR.   PAD (peripheral artery disease) (HCC)    a. s/p bilat BKAs.   Recurrent right pleural effusion    a. 05/2022 s/p thoracentesis (300 ml).   Stroke (cerebrum) (HCC) 03/21/2022   Stroke (HCC) 01/21/2022    Past Surgical History:  Procedure Laterality Date   angioplasty politeal Left 07/2020   avg for dialysis     CHOLECYSTECTOMY     COLONOSCOPY  08/15/2011   cleared for 10 yrs   IR CT HEAD LTD  01/21/2022   IR CT HEAD LTD  03/21/2022   IR PERCUTANEOUS ART THROMBECTOMY/INFUSION INTRACRANIAL INC DIAG ANGIO  01/21/2022   IR PERCUTANEOUS ART THROMBECTOMY/INFUSION INTRACRANIAL INC DIAG  ANGIO  03/21/2022   IR US GUIDE VASC ACCESS RIGHT  01/21/2022   IR US GUIDE VASC ACCESS RIGHT  03/21/2022   LEG AMPUTATION BELOW KNEE Right 04/2019   LEG AMPUTATION BELOW KNEE Left 09/08/2020   RADIOLOGY WITH ANESTHESIA N/A 01/21/2022   Procedure: IR WITH ANESTHESIA;  Surgeon: Radiologist, Medication, MD;  Location: [facility] OR;  Service: Radiology;  Laterality: N/A;   RADIOLOGY WITH ANESTHESIA N/A 03/21/2022   Procedure: IR WITH ANESTHESIA;  Surgeon: Radiologist, Medication, MD;  Location: [facility] OR;  Service: Radiology;  Laterality: N/A;   TOE AMPUTATION Right 10/15/2018     Allergies  Allergies  Allergen Reactions   Augmentin [Amoxicillin-Pot Clavulanate] Shortness Of Breath   Atorvastatin Other (See Comments)    Back pain   Pioglitazone Other (See Comments)    Edema   Peanut-Containing Drug Products Itching   Rosuvastatin Itching and Other (See Comments)    Back pain   Shellfish-Derived Products Itching and Rash    shrimp   Sulfa Antibiotics Rash and Other (See Comments)    Shedding of skin    History of Present Illness    57 y.o. female with a history of paroxysmal atrial fibrillation, hypertension, hyperlipidemia, end-stage renal disease on hemodialysis, diabetes, stroke, R pleural effusion, and peripheral arterial disease status post bilateral BKAs.  Patient suffered acute right MCA stroke in June 2023 secondary to right M1 occlusion, for which she received TNK and mechanical thrombectomy.  Echo showed an EF  of 55 to 60% with moderate LVH, mild MR.  In August 2023, she was admitted with recurrent right MCA stroke and subsequently developed atrial fibrillation with rapid ventricular response.  She was seen by our team and managed with beta-blocker therapy and Eliquis once cleared by neurology.  In the setting of significant bleeding from her dialysis access sites, Eliquis was reduced to 2.5 mg twice daily in the outpatient setting.  In early May 2024, she was seen in the emergency department with  dizziness and palpitations.  She was found to be relatively hypotensive and tachycardic, in A-fib.  She was treated with IV diltiazem and converted to sinus rhythm.  At office follow-up on May 6, she was placed on Toprol-XL 25 mg daily and in the setting of relative hypotension, midodrine was increased to 10 mg 3 times daily.  She was referred to electrophysiology for consideration of antiarrhythmic therapy.  Unfortunately, patient required hospital admission to [facility] on May 20 in the setting of dyspnea, fatigue, and volume overload.  She was initially treated in the outpatient setting with Levaquin out of concern for pneumonia however, following admission, she felt significantly better with hemodialysis and was felt that presentation was more consistent with volume overload.  Unfortunately, Ms. Pennoyer has continued to deal with significant fatigue and dyspnea.  She presented back to the emergency department on 6 June 4 with worsening symptoms as well as cough with clear sputum.  Presenting ECG showed sinus rhythm without acute ST or T changes.  Labs notable for hypokalemia with a potassium of 3.2, lactic acidosis (2.5  2.8).,  Stable normocytic anemia, and normal procalcitonin.  Respiratory panel was positive for rhinovirus, and she was placed on droplet precautions and admitted for further evaluation.  We have been asked to evaluate in the setting of ongoing fatigue and risk factors for coronary artery disease.  An echocardiogram was performed earlier today showing new finding of LV dysfunction with an EF of 35 to 40%, grade 2 diastolic dysfunction, and moderate mitral regurgitation.  Troponin is pending.  Patient denies any history of chest pain.  Inpatient Medications     apixaban  2.5 mg Oral BID   benzonatate  200 mg Oral TID   [START ON 01/18/2023] Chlorhexidine Gluconate Cloth  6 each Topical Q0600   guaiFENesin  600 mg Oral BID   hydrOXYzine  10 mg Oral TID   metoprolol succinate  25 mg Oral  Daily   midodrine  5 mg Oral TID with meals   multivitamin  1 tablet Oral QHS   sodium chloride flush  3 mL Intravenous Q12H    Family History    Family History  Problem Relation Age of Onset   Diabetes Mother    Diabetes Sister    She indicated that her mother is alive. She indicated that her father is alive. She indicated that the status of her sister is unknown.   Social History    Social History   Socioeconomic History   Marital status: Divorced    Spouse name: Not on file   Number of children: Not on file   Years of education: Not on file   Highest education level: Not on file  Occupational History   Not on file  Tobacco Use   Smoking status: Never   Smokeless tobacco: Never  Vaping Use   Vaping Use: Never used  Substance and Sexual Activity   Alcohol use: Yes   Drug use: Never   Sexual activity: Yes  Partners: Male  Other Topics Concern   Not on file  Social History Narrative   Not on file   Social Determinants of Health   Financial Resource Strain: Low Risk  (11/08/2022)   Overall Financial Resource Strain (CARDIA)    Difficulty of Paying Living Expenses: Not hard at all  Food Insecurity: No Food Insecurity (01/17/2023)   Hunger Vital Sign    Worried About Running Out of Food in the Last Year: Never true    Ran Out of Food in the Last Year: Never true  Transportation Needs: No Transportation Needs (01/17/2023)   PRAPARE - Administrator, Civil Service (Medical): No    Lack of Transportation (Non-Medical): No  Physical Activity: Inactive (11/08/2022)   Exercise Vital Sign    Days of Exercise per Week: 0 days    Minutes of Exercise per Session: 0 min  Stress: No Stress Concern Present (11/08/2022)   [employer] of Occupational Health - Occupational Stress Questionnaire    Feeling of Stress : Only a little  Social Connections: Unknown (11/08/2022)   Social Connection and Isolation Panel [NHANES]    Frequency of Communication with Friends  and Family: Patient declined    Frequency of Social Gatherings with Friends and Family: Patient declined    Attends Religious Services: Patient declined    Database administrator or Organizations: Not on file    Attends Banker Meetings: Patient declined    Marital Status: Patient declined  Intimate Partner Violence: Not At Risk (01/17/2023)   Humiliation, Afraid, Rape, and Kick questionnaire    Fear of Current or Ex-Partner: No    Emotionally Abused: No    Physically Abused: No    Sexually Abused: No     Review of Systems    General:  +++ Generalized malaise and fatigue.  No chills, fever, night sweats or weight changes.  Cardiovascular:  No chest pain, +++ dyspnea on exertion, no edema, orthopnea, palpitations, paroxysmal nocturnal dyspnea. Dermatological: No rash, lesions/masses Respiratory: +++ cough with clear sputum, +++ dyspnea Urologic: No hematuria, dysuria Abdominal:   No nausea, vomiting, diarrhea, bright red blood per rectum, melena, or hematemesis Neurologic:  No visual changes, wkns, changes in mental status. All other systems reviewed and are otherwise negative except as noted above.  Physical Exam    Blood pressure 127/84, pulse 81, temperature 99 F (37.2 C), temperature source Oral, resp. rate 19, height 5\' 9"  (1.753 m), weight 92.5 kg, SpO2 96 %.  General: Pleasant, NAD Psych: Normal affect. Neuro: Alert and oriented X 3. Moves all extremities spontaneously. HEENT: Normal  Neck: Supple without bruits or JVD. Lungs:  Resp regular and unlabored, CTA. Heart: RRR no s3, s4, or murmurs. Abdomen: Soft, non-tender, non-distended, BS + x 4.  Extremities:  Bilat BKA. No clubbing, cyanosis or edema. Radials 2+ and equal bilaterally.  Labs    Cardiac Enzymes Recent Labs  Lab 01/01/23 1637  TROPONINIHS 63*     BNP    Component Value Date/Time   BNP >4,500.0 (H) 01/01/2023 2212    Lab Results  Component Value Date   WBC 6.5 01/17/2023   HGB 9.6  (L) 01/17/2023   HCT 31.0 (L) 01/17/2023   MCV 97.5 01/17/2023   PLT 61 (L) 01/17/2023    Recent Labs  Lab 01/17/23 0335  NA 135  K 4.0  CL 98  CO2 28  BUN 21*  CREATININE 4.98*  CALCIUM 8.5*  PROT 6.8  BILITOT 3.3*  ALKPHOS 214*  ALT 154*  AST 85*  GLUCOSE 135*   Lab Results  Component Value Date   CHOL 107 03/22/2022   HDL 35 (L) 03/22/2022   LDLCALC 55 03/22/2022   TRIG 84 03/22/2022     Radiology Studies    CT CHEST WO CONTRAST  Result Date: 01/16/2023 CLINICAL DATA:  Pleural effusion, known or suspected. Weakness beginning on Sunday. EXAM: CT CHEST WITHOUT CONTRAST TECHNIQUE: Multidetector CT imaging of the chest was performed following the standard protocol without IV contrast. RADIATION DOSE REDUCTION: This exam was performed according to the departmental dose-optimization program which includes automated exposure control, adjustment of the mA and/or kV according to patient size and/or use of iterative reconstruction technique. COMPARISON:  Chest radiograph 01/16/2023.  CT 05/30/2022 FINDINGS: Cardiovascular: Cardiac enlargement. No pericardial effusions. Calcification of the coronary arteries and aorta. No aortic aneurysm. Mediastinum/Nodes: Thyroid gland is unremarkable. Esophagus is decompressed. Mediastinal lymph nodes are prominent, with right paratracheal nodes measuring up to about 12 mm short axis dimension. Lymph nodes are similar to prior study, most likely reactive. Lungs/Pleura: Small right pleural effusion is decreased since previous study. Atelectasis or consolidation in the right lung base. Scattered emphysematous changes in the lungs. No pneumothorax. Upper Abdomen: No acute abnormalities demonstrated. Severe and diffuse vascular calcifications throughout the upper abdomen. Musculoskeletal: Degenerative changes in the spine. IMPRESSION: 1. Small right pleural effusion with consolidation or atelectasis in the right lung base. Mild improvement since prior CT. For  2 diffuse cardiac enlargement. 2. Aortic atherosclerosis with severe vascular calcification throughout the chest and abdomen. 3. Stable appearance of mildly enlarged mediastinal lymph nodes, likely reactive. 4. Emphysematous changes in the lungs. Electronically Signed   By: Burman Nieves M.D.   On: 01/16/2023 23:46   DG Chest Port 1 View  Result Date: 01/16/2023 CLINICAL DATA:  Weakness and Saturday EXAM: PORTABLE CHEST 1 VIEW COMPARISON:  Radiographs 01/11/2023 FINDINGS: Stable cardiomegaly. Airspace and interstitial opacities in the right mid and lower lung. Moderate right pleural effusion. The left lung is clear. No pneumothorax. No displaced rib fractures. IMPRESSION: Airspace and interstitial opacities in the right mid and lower lung with moderate right pleural effusion similar to prior. Cardiomegaly. Electronically Signed   By: Minerva Fester M.D.   On: 01/16/2023 19:20   DG Abd Acute W/Chest  Result Date: 01/11/2023 CLINICAL DATA:  Constipated EXAM: DG ABDOMEN ACUTE WITH 1 VIEW CHEST COMPARISON:  01/01/2023 FINDINGS: Cardiomegaly with vascular congestion. Right pleural effusion with right lower lobe atelectasis or scarring, stable. No confluent opacity on the left. Moderate stool burden in the colon. There is normal bowel gas pattern. No free air. No organomegaly or suspicious calcification. No acute bony abnormality. IMPRESSION: Chronic right pleural effusion with right base atelectasis or scarring, stable. Cardiomegaly, vascular congestion. Moderate stool burden in the colon. No bowel obstruction or free air. Electronically Signed   By: Charlett Nose M.D.   On: 01/11/2023 02:11   US Abdomen Limited RUQ (LIVER/GB)  Result Date: 01/02/2023 CLINICAL DATA:  Transaminitis.  History of cholecystectomy. EXAM: ULTRASOUND ABDOMEN LIMITED RIGHT UPPER QUADRANT COMPARISON:  CT and MRCP 05/30/2022 FINDINGS: Gallbladder: Surgically absent. Common bile duct: Diameter: 8 mm.  No intrahepatic biliary dilation.  Liver: No focal lesion identified. Question subtle nodularity of the hepatic contour. Within normal limits in parenchymal echogenicity. Portal vein is patent on color Doppler imaging with normal direction of blood flow towards the liver. Other: Incidental note of right pleural effusion. Trace perihepatic fluid. IMPRESSION: 1. Question subtle  nodularity of the hepatic contour which can be seen with cirrhosis. 2. Trace perihepatic fluid. 3. Incidental note of right pleural effusion. Electronically Signed   By: Minerva Fester M.D.   On: 01/02/2023 01:13   DG Chest 2 View  Result Date: 01/01/2023 CLINICAL DATA:  Shortness of breath. EXAM: CHEST - 2 VIEW COMPARISON:  Dec 27, 2022. FINDINGS: Stable cardiomediastinal silhouette. Stable small right pleural effusion is noted with associated right basilar atelectasis. Left lung is unremarkable. Bony thorax is unremarkable. IMPRESSION: Stable small right pleural effusion with associated right basilar atelectasis or scarring. Electronically Signed   By: Lupita Raider M.D.   On: 01/01/2023 13:48   DG Chest 2 View  Result Date: 12/28/2022 CLINICAL DATA:  57 year old female with constipation. Dialysis patient. Shortness of breath on exertion. EXAM: CHEST - 2 VIEW COMPARISON:  Portable chest 05/31/2022. CT Chest, Abdomen, and Pelvis 05/30/2022. FINDINGS: PA and lateral views at 1215 hours on 12/27/2022. Stable lung volumes since last year. Stable mild cardiomegaly. Other mediastinal contours are within normal limits. Chronic right lung base fibrothorax, chronic pleural effusion and pleural thickening as demonstrated on prior CT. Adjacent atelectasis, right lung base ventilation not significantly changed from October. No superimposed pneumothorax. Pulmonary vascularity however is increased in both lungs. No left pleural effusion. No other confluent opacity. Visualized tracheal air column is within normal limits. No acute osseous abnormality identified. Negative visible  bowel gas. IMPRESSION: 1. Acute interstitial opacity. Consider acute interstitial edema in this setting, viral/atypical respiratory infection also possible. 2. Underlying chronic right lung base fibrothorax / chronic pleural effusion. Stable ventilation there since October. 3. Mild cardiomegaly. Electronically Signed   By: Odessa Fleming M.D.   On: 12/28/2022 08:02   DG Abdomen 1 View  Result Date: 12/28/2022 CLINICAL DATA:  57 year old female with constipation. Dialysis patient. EXAM: ABDOMEN - 1 VIEW COMPARISON:  CT Abdomen and Pelvis 05/30/2022. FINDINGS: 2 spine views of the abdomen at 0740 hours. Diffuse calcified atherosclerosis in the abdomen and pelvis. Stable cholecystectomy clips. Non obstructed bowel gas pattern. Average volume of retained stool. No acute osseous abnormality identified. IMPRESSION: 1. Nonobstructed bowel-gas pattern with an average volume of retained stool. 2. Diffuse visceral calcified atherosclerosis. Electronically Signed   By: Odessa Fleming M.D.   On: 12/28/2022 07:59    ECG & Cardiac Imaging    Regular sinus rhythm, 83, PVCs, IVCD, poor R wave progression, nonspecific ST and T changes- personally reviewed.  Assessment & Plan    1.  Acute respiratory failure/rhinovirus: Patient presented with ongoing fatigue, dyspnea, and cough with clear sputum.  Tested positive for rhinovirus.  Initially received antibiotics with plan to discontinue ceftriaxone and continue azithromycin x 3 doses per medicine team.  2.  Heart failure with reduced ejection fraction: As above, patient with fatigue and dyspnea.  Echo this admission now showing an EF of 35 to 40% down from normal last year.  She does not appear to be significantly volume overloaded on examination.  Previously on outpatient diuretic in addition to receiving hemodialysis, though notes that she has not been on diuretic in some time in the setting of relative hypotension requiring midodrine.  With relative hypotension, she is a poor  candidate for escalation of GDMT.  Continue beta-blocker.  Pending recovery from rhinovirus infection, she will benefit from an ischemic evaluation.  CT does show coronary calcifications and aortic atherosclerosis.  Follow-up troponins.  If significantly elevated, would like to pursue diagnostic catheterization during this admission, following recovery from rhinovirus infection (  would need to hold eliquis).  If troponins normal/flat, can consider outpatient evaluation.  3.  Essential hypertension/relative hypotension: Blood pressure is currently stable on both low-dose beta-blocker and midodrine.  4.  Paroxysmal atrial fibrillation: CHA2DS2VASc = 7.  Stable since ED visit in early May on beta-blocker therapy.  Currently on Eliquis 2.5 mg twice daily, which represents an inadequate dose based on her age and weight.  She notes that she had significant bleeding from her dialysis access site on 5 mg twice daily.  If she cannot tolerate 5 mg twice daily, we might wish to consider discontinuation of Eliquis given unclear benefits when underdosed.  She is already scheduled to follow-up with electrophysiology for additional input regarding management of atrial fibrillation and potentially a watchman.  5.  End-stage renal disease: Seen by nephrology with plan for ongoing hemodialysis.  Consideration to be given to resuming outpatient oral diuretic, she is still making urine.  6.  Type 2 diabetes mellitus: Per medicine team.  7.  Hyperlipidemia: LDL of 59 last August however, statin has been on hold in the setting of elevated LFTs dating back to last October.  8.  Normocytic anemia: Stable.  Risk Assessment/Risk Scores:          CHA2DS2-VASc Score = 7   This indicates a 11.2% annual risk of stroke. The patient's score is based upon: CHF History: 1 HTN History: 1 Diabetes History: 1 Stroke History: 2 Vascular Disease History: 1 Age Score: 0 Gender Score: 1     Signed, Nicolasa Ducking,  NP 01/17/2023, 2:46 PM  For questions or updates, please contact   Please consult [email or web address] for contact info under Cardiology/STEMI.

## 2023-01-17 NOTE — TOC Initial Note (Addendum)
Transition of Care [facility]) - Initial/Assessment Note    Patient Details  Name: Cindy Osborne MRN: [id number] Date of Birth: 1966/04/15  Transition [facility]) CM/SW Contact:    Allena Katz, LCSW Phone Number: 01/17/2023, 10:53 AM  Clinical Narrative:    CSW spoke with pt who reports she has 24/7 care with an agency in [place]. Pt is active with [facility] and would like to continue services with them. Pt is active with PCP Dr. Judithann Graves and reports she saw her yesterday for care. TOC will continue to follow for discharge care needs.     Pt active with dialysis. Information below.         [facility]  Tuesday Thursday Saturday at 11:30   Expected Discharge Plan: Home w Home Health Services Barriers to Discharge: Continued Medical Work up   Patient Goals and CMS Choice Patient states their goals for this hospitalization and ongoing recovery are:: return CMS Medicare.gov Compare Post Acute Care list provided to:: Patient Choice offered to / list presented to : Patient      Expected Discharge Plan and Services                                   HH Arranged: PT, OT, RN [facility] Agency: [facility]        Prior Living Arrangements/Services   Lives with::  (24/7 care) Patient language and need for interpreter reviewed:: Yes Do you feel safe going back to the place where you live?: Yes          Current home services: Home PT, Home OT    Activities of Daily Living Home Assistive Devices/Equipment: Walker (specify type), Built-in shower seat ADL Screening (condition at time of admission) Patient's cognitive ability adequate to safely complete daily activities?: Yes Is the patient deaf or have difficulty hearing?: No Does the patient have difficulty seeing, even when wearing glasses/contacts?: No Does the patient have difficulty concentrating, remembering, or making decisions?: No Patient able to express need for assistance with ADLs?:  Yes Does the patient have difficulty dressing or bathing?: Yes Independently performs ADLs?: No Dressing (OT): Needs assistance Is this a change from baseline?: Pre-admission baseline Does the patient have difficulty walking or climbing stairs?: Yes Weakness of Legs: Both Weakness of Arms/Hands: Both  Permission Sought/Granted                  Emotional Assessment       Orientation: : Oriented to Self, Oriented to Place, Oriented to  Time, Oriented to Situation      Admission diagnosis:  Pleural effusion [J90] Patient Active Problem List   Diagnosis Date Noted   Pleural effusion 01/16/2023   Acute respiratory distress 01/02/2023   Fatigue 01/01/2023   Type II diabetes mellitus with renal manifestations (HCC) 05/30/2022   HLD (hyperlipidemia) 05/30/2022   Obesity with body mass index (BMI) of 30.0 to 39.9 05/30/2022   Abnormal LFTs 05/30/2022   Chronic diastolic CHF (congestive heart failure) (HCC) 05/30/2022   Thyroid nodule 05/30/2022   Pleural effusion on right 05/30/2022   PAF (paroxysmal atrial fibrillation) (HCC) 05/30/2022   Intractable nausea and vomiting 05/27/2022   Weakness of left upper extremity 05/11/2022   History of stroke with current residual effects 05/08/2022   Mood disorder (HCC) 05/08/2022   Pressure injury of skin 04/05/2022   Acute right arterial ischemic stroke, MCA (middle cerebral artery) (HCC) 01/21/2022  Combined forms of age-related cataract of both eyes 06/30/2021   Diabetic macular edema (HCC) 06/30/2021   Panic disorder 03/03/2021   Acquired absence of left leg below knee (HCC) 10/26/2020   PVD (peripheral vascular disease) (HCC) 07/28/2020   Uncontrolled type 2 diabetes mellitus with diabetic peripheral angiopathy without gangrene 02/26/2020   Acquired absence of right leg below knee (HCC) 07/28/2019   Diabetic retinopathy (HCC) 11/23/2016   Slow transit constipation 09/24/2015   Environmental and seasonal allergies 07/09/2015    Anemia in ESRD (end-stage renal disease) (HCC) 07/09/2015   ESRD (end stage renal disease) on dialysis (HCC) 07/09/2015   Type II diabetes mellitus with complication (HCC) 07/09/2015   Herpes simplex infection 07/09/2015   Hyperlipidemia associated with type 2 diabetes mellitus (HCC) 07/09/2015   Heart & renal disease, hypertensive, with heart failure (HCC) 07/09/2015   Avitaminosis D 07/09/2015   Obstructive apnea 12/15/2014   Essential hypertension 09/11/2013   PCP:  Reubin Milan, MD Pharmacy:   [facility] [phone] - [place], [place] - [address] [place] [address] Phone: [phone] Fax: [phone]     Social Determinants of Health (SDOH) Social History: SDOH Screenings   Food Insecurity: No Food Insecurity (01/17/2023)  Housing: Low Risk  (01/17/2023)  Transportation Needs: No Transportation Needs (01/17/2023)  Utilities: Not At Risk (01/17/2023)  Alcohol Screen: Low Risk  (11/08/2022)  Depression (PHQ2-9): High Risk (01/15/2023)  Financial Resource Strain: Low Risk  (11/08/2022)  Physical Activity: Inactive (11/08/2022)  Social Connections: Unknown (11/08/2022)  Stress: No Stress Concern Present (11/08/2022)  Tobacco Use: Low Risk  (01/17/2023)   SDOH Interventions:     Readmission Risk Interventions     No data to display

## 2023-01-18 DIAGNOSIS — J90 Pleural effusion, not elsewhere classified: Secondary | ICD-10-CM | POA: Diagnosis not present

## 2023-01-18 LAB — RENAL FUNCTION PANEL
Albumin: 3.5 g/dL (ref 3.5–5.0)
Albumin: 3.5 g/dL (ref 3.5–5.0)
Anion gap: 19 — ABNORMAL HIGH (ref 5–15)
Anion gap: 17 — ABNORMAL HIGH (ref 5–15)
BUN: 36 mg/dL — ABNORMAL HIGH (ref 6–20)
BUN: 16 mg/dL (ref 6–20)
CO2: 20 mmol/L — ABNORMAL LOW (ref 22–32)
CO2: 26 mmol/L (ref 22–32)
Calcium: 8.8 mg/dL — ABNORMAL LOW (ref 8.9–10.3)
Calcium: 8.8 mg/dL — ABNORMAL LOW (ref 8.9–10.3)
Chloride: 97 mmol/L — ABNORMAL LOW (ref 98–111)
Chloride: 95 mmol/L — ABNORMAL LOW (ref 98–111)
Creatinine, Ser: 6.49 mg/dL — ABNORMAL HIGH (ref 0.44–1.00)
Creatinine, Ser: 3.29 mg/dL — ABNORMAL HIGH (ref 0.44–1.00)
GFR, Estimated: 7 mL/min — ABNORMAL LOW (ref >60)
GFR, Estimated: 16 mL/min — ABNORMAL LOW (ref >60)
Glucose, Bld: 146 mg/dL — ABNORMAL HIGH (ref 70–99)
Glucose, Bld: 116 mg/dL — ABNORMAL HIGH (ref 70–99)
Phosphorus: 4.8 mg/dL — ABNORMAL HIGH (ref 2.5–4.6)
Phosphorus: 2.8 mg/dL (ref 2.5–4.6)
Potassium: 4.4 mmol/L (ref 3.5–5.1)
Potassium: 4.1 mmol/L (ref 3.5–5.1)
Sodium: 136 mmol/L (ref 135–145)
Sodium: 138 mmol/L (ref 135–145)

## 2023-01-18 LAB — CBC
HCT: 31.3 % — ABNORMAL LOW (ref 36.0–46.0)
HCT: 33.7 % — ABNORMAL LOW (ref 36.0–46.0)
Hemoglobin: 9.9 g/dL — ABNORMAL LOW (ref 12.0–15.0)
Hemoglobin: 10.5 g/dL — ABNORMAL LOW (ref 12.0–15.0)
MCH: 30.6 pg (ref 26.0–34.0)
MCH: 30.4 pg (ref 26.0–34.0)
MCHC: 31.6 g/dL (ref 30.0–36.0)
MCHC: 31.2 g/dL (ref 30.0–36.0)
MCV: 96.6 fL (ref 80.0–100.0)
MCV: 97.7 fL (ref 80.0–100.0)
Platelets: 68 10*3/uL — ABNORMAL LOW (ref 150–400)
Platelets: 58 10*3/uL — ABNORMAL LOW (ref 150–400)
RBC: 3.24 MIL/uL — ABNORMAL LOW (ref 3.87–5.11)
RBC: 3.45 MIL/uL — ABNORMAL LOW (ref 3.87–5.11)
RDW: 18.1 % — ABNORMAL HIGH (ref 11.5–15.5)
RDW: 18.2 % — ABNORMAL HIGH (ref 11.5–15.5)
WBC: 6 10*3/uL (ref 4.0–10.5)
WBC: 7.6 10*3/uL (ref 4.0–10.5)
nRBC: 0.3 % — ABNORMAL HIGH (ref 0.0–0.2)
nRBC: 0.4 % — ABNORMAL HIGH (ref 0.0–0.2)

## 2023-01-18 LAB — HEPATITIS B SURFACE ANTIBODY, QUANTITATIVE: Hep B S AB Quant (Post): 103 m[IU]/mL (ref >9.9)

## 2023-01-18 MED ORDER — IPRATROPIUM-ALBUTEROL 0.5-2.5 (3) MG/3ML IN SOLN
3.0000 mL | RESPIRATORY_TRACT | Status: DC | PRN
  Administered 2023-01-18: 3 mL via RESPIRATORY_TRACT
  Filled 2023-01-18: qty 3

## 2023-01-18 MED ORDER — HEPARIN SODIUM (PORCINE) 5000 UNIT/ML IJ SOLN: 5000.0000 [IU] | Freq: Three times a day (TID) | INTRAMUSCULAR | Status: DC

## 2023-01-18 NOTE — Progress Notes (Signed)
  Received patient in bed to unit.   Informed consent signed and in chart.    TX duration:3.5hrs     Transported back to floor  Hand-off given to patient's nurse. No c/o and no distress noted    Access used: LAVF Access issues: none   Total UF removed: 0L Medication(s) given: none Post HD VS: 131/79 Post HD weight: 89.9     Lynann Beaver  [facility]

## 2023-01-18 NOTE — Progress Notes (Addendum)
[facility]  ROUNDING NOTE   Subjective:   Cindy Osborne is a 57 y.o. female with past medical history including hypertension, hyperlipidemia, stroke, diabetes, and end stage renal disease on ;hemodialysis. Patient presented to the ED with prolonged fatigue and has been admitted for Pleural effusion [J90] CHF (congestive heart failure) (HCC) [I50.9]  Patient is known to our practice and receives outpatient dialysis treatments at [facility] on a TTS schedule, supervised by Dr. Wynelle Link.    Patient seen and evaluated during dialysis   HEMODIALYSIS FLOWSHEET:  Blood Flow Rate (mL/min): 400 mL/min Arterial Pressure (mmHg): -140 mmHg Venous Pressure (mmHg): 240 mmHg TMP (mmHg): 6 mmHg Ultrafiltration Rate (mL/min): 257 mL/min Dialysate Flow Rate (mL/min): 300 ml/min  Tolerating treatment seated in chair  Objective:  Vital signs in last 24 hours:  Temp:  [97.3 F (36.3 C)-98.5 F (36.9 C)] 97.6 F (36.4 C) (06/06 1221) Pulse Rate:  [71-86] 84 (06/06 1221) Resp:  [16-40] 35 (06/06 1221) BP: (123-143)/(75-99) 131/79 (06/06 1221) SpO2:  [91 %-100 %] 91 % (06/06 1221) Weight:  [89.9 kg] 89.9 kg (06/06 0823)  Weight change:  Filed Weights   01/16/23 1828 01/17/23 0113 01/18/23 0823  Weight: 92.5 kg 92.5 kg 89.9 kg    Intake/Output: I/O last 3 completed shifts: In: 96.7 [IV Piggyback:96.7] Out: -    Intake/Output this shift:  No intake/output data recorded.  Physical Exam: General: NAD  Head: Normocephalic, atraumatic. Moist oral mucosal membranes  Eyes: Anicteric  Lungs:  Clear to auscultation, normal effort  Heart: Regular rate and rhythm  Abdomen:  Soft, nontender,   Extremities: No peripheral edema, bilateral BKA  Neurologic: Nonfocal, moving all four extremities  Skin: No lesions  Access: Left aVF    Basic Metabolic Panel: Recent Labs  Lab 01/16/23 1842 01/17/23 0335 01/18/23 0840  NA 134* 135 136  K 3.2* 4.0 4.4  CL 92* 98 97*   CO2 25 28 20*  GLUCOSE 155* 135* 146*  BUN 17 21* 36*  CREATININE 4.20* 4.98* 6.49*  CALCIUM 9.0 8.5* 8.8*  PHOS  --  3.4 4.8*     Liver Function Tests: Recent Labs  Lab 01/17/23 0335 01/18/23 0840  AST 85*  --   ALT 154*  --   ALKPHOS 214*  --   BILITOT 3.3*  --   PROT 6.8  --   ALBUMIN 3.6 3.5    No results for input(s): "LIPASE", "AMYLASE" in the last 168 hours.  No results for input(s): "AMMONIA" in the last 168 hours.  CBC: Recent Labs  Lab 01/16/23 1842 01/17/23 0335 01/18/23 0840  WBC 7.8 6.5 6.0  HGB 11.0* 9.6* 9.9*  HCT 36.1 31.0* 31.3*  MCV 98.6 97.5 96.6  PLT 63* 61* 68*     Cardiac Enzymes: No results for input(s): "CKTOTAL", "CKMB", "CKMBINDEX", "TROPONINI" in the last 168 hours.  BNP: Invalid input(s): "POCBNP"  CBG: Recent Labs  Lab 01/17/23 1220  GLUCAP 149*     Microbiology: Results for orders placed or performed during the hospital encounter of 01/16/23  Blood Culture (routine x 2)     Status: None (Preliminary result)   Collection Time: 01/16/23  6:41 PM   Specimen: BLOOD  Result Value Ref Range Status   Specimen Description BLOOD BLOOD RIGHT ARM  Final   Special Requests   Final    BOTTLES DRAWN AEROBIC AND ANAEROBIC Blood Culture adequate volume   Culture   Final    NO GROWTH 2 DAYS Performed at [employer]  [facility], [address] [place], [place] [address]    Report Status PENDING  Incomplete  Blood Culture (routine x 2)     Status: None (Preliminary result)   Collection Time: 01/16/23 10:39 PM   Specimen: BLOOD  Result Value Ref Range Status   Specimen Description BLOOD BLOOD RIGHT FOREARM  Final   Special Requests   Final    BOTTLES DRAWN AEROBIC AND ANAEROBIC Blood Culture adequate volume   Culture   Final    NO GROWTH 2 DAYS Performed at [facility], [address], [place], [place] [address]    Report Status PENDING  Incomplete  Respiratory (~20 pathogens) panel by PCR     Status: Abnormal    Collection Time: 01/16/23 11:28 PM   Specimen: Nasopharyngeal Swab; Respiratory  Result Value Ref Range Status   Adenovirus NOT DETECTED NOT DETECTED Final   Coronavirus 229E NOT DETECTED NOT DETECTED Final    Comment: (NOTE) The Coronavirus on the Respiratory Panel, DOES NOT test for the novel  Coronavirus (2019 nCoV)    Coronavirus HKU1 NOT DETECTED NOT DETECTED Final   Coronavirus NL63 NOT DETECTED NOT DETECTED Final   Coronavirus OC43 NOT DETECTED NOT DETECTED Final   Metapneumovirus NOT DETECTED NOT DETECTED Final   Rhinovirus / Enterovirus DETECTED (A) NOT DETECTED Final   Influenza A NOT DETECTED NOT DETECTED Final   Influenza B NOT DETECTED NOT DETECTED Final   Parainfluenza Virus 1 NOT DETECTED NOT DETECTED Final   Parainfluenza Virus 2 NOT DETECTED NOT DETECTED Final   Parainfluenza Virus 3 NOT DETECTED NOT DETECTED Final   Parainfluenza Virus 4 NOT DETECTED NOT DETECTED Final   Respiratory Syncytial Virus NOT DETECTED NOT DETECTED Final   Bordetella pertussis NOT DETECTED NOT DETECTED Final   Bordetella Parapertussis NOT DETECTED NOT DETECTED Final   Chlamydophila pneumoniae NOT DETECTED NOT DETECTED Final   Mycoplasma pneumoniae NOT DETECTED NOT DETECTED Final    Comment: Performed at [facility], [address] [place], [place] [address]  SARS Coronavirus 2 by RT PCR (hospital order, performed in [facility] hospital lab) *cepheid single result test* Anterior Nasal Swab     Status: None   Collection Time: 01/17/23 12:43 AM   Specimen: Anterior Nasal Swab  Result Value Ref Range Status   SARS Coronavirus 2 by RT PCR NEGATIVE NEGATIVE Final    Comment: (NOTE) SARS-CoV-2 target nucleic acids are NOT DETECTED.  The SARS-CoV-2 RNA is generally detectable in upper and lower respiratory specimens during the acute phase of infection. The lowest concentration of SARS-CoV-2 viral copies this assay can detect is 250 copies / mL. A negative result does not preclude  SARS-CoV-2 infection and should not be used as the sole basis for treatment or other patient management decisions.  A negative result may occur with improper specimen collection / handling, submission of specimen other than nasopharyngeal swab, presence of viral mutation(s) within the areas targeted by this assay, and inadequate number of viral copies (<250 copies / mL). A negative result must be combined with clinical observations, patient history, and epidemiological information.  Fact Sheet for Patients:   RoadLapTop.co.za  Fact Sheet for Healthcare Providers: http://kim-miller.com/  This test is not yet approved or  cleared by the [place] FDA and has been authorized for detection and/or diagnosis of SARS-CoV-2 by FDA under an Emergency Use Authorization (EUA).  This EUA will remain in effect (meaning this test can be used) for the duration of the COVID-19 declaration  under Section 564(b)(1) of the Act, 21 U.S.C. section 360bbb-3(b)(1), unless the authorization is terminated or revoked sooner.  Performed at [facility], [address] [place], [place] [address]     Coagulation Studies: No results for input(s): "LABPROT", "INR" in the last 72 hours.  Urinalysis: No results for input(s): "COLORURINE", "LABSPEC", "PHURINE", "GLUCOSEU", "HGBUR", "BILIRUBINUR", "KETONESUR", "PROTEINUR", "UROBILINOGEN", "NITRITE", "LEUKOCYTESUR" in the last 72 hours.  Invalid input(s): "APPERANCEUR"    Imaging: ECHOCARDIOGRAM COMPLETE  Result Date: 01/17/2023    ECHOCARDIOGRAM REPORT   Patient Name:   Cindy Osborne Date of Exam: 01/17/2023 Medical Rec #:  [id number]         Height:       69.0 in Accession #:    [id number]        Weight:       204.0 lb Date of Birth:  Jan 26, 1966          BSA:          2.083 m Patient Age:    56 years          BP:           138/86 mmHg Patient Gender: F                 HR:           78 bpm. Exam Location:   [facility] Procedure: 2D Echo, Cardiac Doppler and Color Doppler Indications:     CHF-acute systolic I50.21  History:         Patient has prior history of Echocardiogram examinations, most                  recent 01/22/2022. Stroke; Risk Factors:Dyslipidemia and                  Hypertension.  Sonographer:     Cristela Blue Referring Phys:  [id number] [facility] GOEL Diagnosing Phys: Debbe Odea MD IMPRESSIONS  1. Left ventricular ejection fraction, by estimation, is 35 to 40%. The left ventricle has moderately decreased function. The left ventricle has no regional wall motion abnormalities. There is mild left ventricular hypertrophy. Left ventricular diastolic parameters are consistent with Grade II diastolic dysfunction (pseudonormalization).  2. Right ventricular systolic function was not well visualized. The right ventricular size is not well visualized.  3. The mitral valve is degenerative. Moderate mitral valve regurgitation. Moderate mitral annular calcification.  4. The aortic valve is calcified. Aortic valve regurgitation is not visualized. FINDINGS  Left Ventricle: Left ventricular ejection fraction, by estimation, is 35 to 40%. The left ventricle has moderately decreased function. The left ventricle has no regional wall motion abnormalities. The left ventricular internal cavity size was normal in size. There is mild left ventricular hypertrophy. Left ventricular diastolic parameters are consistent with Grade II diastolic dysfunction (pseudonormalization). Right Ventricle: The right ventricular size is not well visualized. No increase in right ventricular wall thickness. Right ventricular systolic function was not well visualized. Left Atrium: Left atrial size was normal in size. Right Atrium: Right atrial size was normal in size. Pericardium: There is no evidence of pericardial effusion. Mitral Valve: The mitral valve is degenerative in appearance. Moderate mitral annular calcification. Moderate mitral valve  regurgitation. MV peak gradient, 8.8 mmHg. The mean mitral valve gradient is 3.0 mmHg. Tricuspid Valve: The tricuspid valve is not well visualized. Tricuspid valve regurgitation is not demonstrated. Aortic Valve: The aortic valve is calcified. Aortic valve regurgitation is not visualized. Aortic valve mean gradient measures 10.0 mmHg. Aortic  valve peak gradient measures 17.0 mmHg. Aortic valve area, by VTI measures 0.96 cm. Pulmonic Valve: The pulmonic valve was not well visualized. Pulmonic valve regurgitation is not visualized. Aorta: The aortic root is normal in size and structure. Venous: The inferior vena cava was not well visualized. IAS/Shunts: No atrial level shunt detected by color flow Doppler.  LEFT VENTRICLE PLAX 2D LVIDd:         5.60 cm      Diastology LVIDs:         4.80 cm      LV e' medial:    5.00 cm/s LV PW:         1.00 cm      LV E/e' medial:  31.6 LV IVS:        1.30 cm      LV e' lateral:   8.05 cm/s LVOT diam:     2.00 cm      LV E/e' lateral: 19.6 LV SV:         39 LV SV Index:   19 LVOT Area:     3.14 cm  LV Volumes (MOD) LV vol d, MOD A4C: 115.0 ml LV vol s, MOD A4C: 114.0 ml LV SV MOD A4C:     115.0 ml RIGHT VENTRICLE RV S prime:     9.68 cm/s TAPSE (M-mode): 1.8 cm LEFT ATRIUM             Index        RIGHT ATRIUM           Index LA diam:        4.60 cm 2.21 cm/m   RA Area:     19.60 cm LA Vol (A2C):   51.1 ml 24.53 ml/m  RA Volume:   55.50 ml  26.64 ml/m LA Vol (A4C):   72.1 ml 34.61 ml/m LA Biplane Vol: 65.2 ml 31.29 ml/m  AORTIC VALVE AV Area (Vmax):    0.97 cm AV Area (Vmean):   0.82 cm AV Area (VTI):     0.96 cm AV Vmax:           206.33 cm/s AV Vmean:          149.333 cm/s AV VTI:            0.410 m AV Peak Grad:      17.0 mmHg AV Mean Grad:      10.0 mmHg LVOT Vmax:         63.90 cm/s LVOT Vmean:        38.800 cm/s LVOT VTI:          0.125 m LVOT/AV VTI ratio: 0.30  AORTA Ao Root diam: 2.70 cm MITRAL VALVE MV Area (PHT): 5.75 cm     SHUNTS MV Area VTI:   1.03 cm      Systemic VTI:  0.12 m MV Peak grad:  8.8 mmHg     Systemic Diam: 2.00 cm MV Mean grad:  3.0 mmHg MV Vmax:       1.48 m/s MV Vmean:      79.3 cm/s MV Decel Time: 132 msec MV E velocity: 158.00 cm/s MV A velocity: 92.20 cm/s MV E/A ratio:  1.71 Debbe Odea MD Electronically signed by Debbe Odea MD Signature Date/Time: 01/17/2023/12:49:10 PM    Final    CT CHEST WO CONTRAST  Result Date: 01/16/2023 CLINICAL DATA:  Pleural effusion, known or suspected. Weakness beginning on Sunday. EXAM: CT CHEST WITHOUT CONTRAST TECHNIQUE: Multidetector CT imaging  of the chest was performed following the standard protocol without IV contrast. RADIATION DOSE REDUCTION: This exam was performed according to the departmental dose-optimization program which includes automated exposure control, adjustment of the mA and/or kV according to patient size and/or use of iterative reconstruction technique. COMPARISON:  Chest radiograph 01/16/2023.  CT 05/30/2022 FINDINGS: Cardiovascular: Cardiac enlargement. No pericardial effusions. Calcification of the coronary arteries and aorta. No aortic aneurysm. Mediastinum/Nodes: Thyroid gland is unremarkable. Esophagus is decompressed. Mediastinal lymph nodes are prominent, with right paratracheal nodes measuring up to about 12 mm short axis dimension. Lymph nodes are similar to prior study, most likely reactive. Lungs/Pleura: Small right pleural effusion is decreased since previous study. Atelectasis or consolidation in the right lung base. Scattered emphysematous changes in the lungs. No pneumothorax. Upper Abdomen: No acute abnormalities demonstrated. Severe and diffuse vascular calcifications throughout the upper abdomen. Musculoskeletal: Degenerative changes in the spine. IMPRESSION: 1. Small right pleural effusion with consolidation or atelectasis in the right lung base. Mild improvement since prior CT. For 2 diffuse cardiac enlargement. 2. Aortic atherosclerosis with severe vascular  calcification throughout the chest and abdomen. 3. Stable appearance of mildly enlarged mediastinal lymph nodes, likely reactive. 4. Emphysematous changes in the lungs. Electronically Signed   By: Burman Nieves M.D.   On: 01/16/2023 23:46   DG Chest Port 1 View  Result Date: 01/16/2023 CLINICAL DATA:  Weakness and Saturday EXAM: PORTABLE CHEST 1 VIEW COMPARISON:  Radiographs 01/11/2023 FINDINGS: Stable cardiomegaly. Airspace and interstitial opacities in the right mid and lower lung. Moderate right pleural effusion. The left lung is clear. No pneumothorax. No displaced rib fractures. IMPRESSION: Airspace and interstitial opacities in the right mid and lower lung with moderate right pleural effusion similar to prior. Cardiomegaly. Electronically Signed   By: Minerva Fester M.D.   On: 01/16/2023 19:20     Medications:    anticoagulant sodium citrate     azithromycin 500 mg (01/18/23 0023)    benzonatate  200 mg Oral TID   Chlorhexidine Gluconate Cloth  6 each Topical Q0600   feeding supplement  237 mL Oral BID BM   guaiFENesin  600 mg Oral BID   hydrOXYzine  10 mg Oral TID   metoprolol succinate  25 mg Oral Daily   midodrine  5 mg Oral TID with meals   multivitamin  1 tablet Oral QHS   sodium chloride flush  3 mL Intravenous Q12H   acetaminophen **OR** acetaminophen, ALPRAZolam, alteplase, anticoagulant sodium citrate, chlorpheniramine-HYDROcodone, heparin, ipratropium-albuterol, lactulose, lidocaine (PF), lidocaine-prilocaine, ondansetron (ZOFRAN) IV, pentafluoroprop-tetrafluoroeth, traZODone  Assessment/ Plan:  Ms. Artia Marsala is a 57 y.o.  female with past medical history including hypertension, hyperlipidemia, stroke, diabetes, and end stage renal disease on ;hemodialysis. Patient presented to the ED with prolonged fatigue and has been admitted for Pleural effusion [J90] CHF (congestive heart failure) (HCC) [I50.9]  [facility]/TTS/ Lt AVF/ 90.0kg  End stage renal  disease requiring hemodialysis.   Receiving dialysis today, UF 0ml. Next treatment scheduled for Saturday.   Spoke with Cardiology, patient cleared to proceed with heart cath.   2. Anemia of chronic kidney disease Lab Results  Component Value Date   HGB 9.9 (L) 01/18/2023    Hemoglobin within desired range. No need for ESA outpatient.   3. Secondary Hyperparathyroidism: with outpatient labs: PTH 2564, phosphorus 6.2, calcium 9.0 on 12/19/22.   Lab Results  Component Value Date   CALCIUM 8.8 (L) 01/18/2023   CAION 0.90 (L) 05/27/2022   PHOS 4.8 (H)  01/18/2023    Calcium and phosphorus stable. Patient prescribed Velphoro outpatient with meals  4. Diabetes mellitus type II with chronic kidney disease/renal manifestations: insulin dependent. Home regimen includes Humalog and Lantus. Most recent hemoglobin A1c is 5.9 on 10/17/22.     LOS: 1 Makelle Marrone 6/6/202412:24 PM

## 2023-01-18 NOTE — Progress Notes (Signed)
PROGRESS NOTE    Cindy Osborne  ZOX:[id number] DOB: 1966-01-01 DOA: 01/16/2023 PCP: Reubin Milan, MD  106A/106A-BB  LOS: 1 day   Brief hospital course:   Assessment & Plan: 57 y.o. female with medical history significant of ESRD on HD. Patient was in her usual state of health till about early may. Since that time patinet reports marked fatigue as a major complaint. It has been interefiring with her acitivites of daily living.  Patient has also been complaining of sob.  She was admited [facility] with diagnosis of " hypoxia" possibly due to a known chornic pleural effuion and discharged. However, patient has conitnued to have this complaint of fatigue.    Patient positive for rhinovirus  Progressive fatigue Dyspnea on exertion Patient presented to the ED 3 times recently for similar complaints.  Unclear etiology.  Likely multifactorial.  Significant coronary atherosclerosis noted on CAT scan.  Possibility of underlying ischemic disease/angina.  Cardiology consulted. Also has rhinovirus infection.   Rhinovirus infection --supportive care  Cardiomyopathy Systolic CHF Drop in ejection fraction 35 to 40% down from normal EF on prior study Unable to exclude underlying ischemia, unable to exclude focal wall motion abnormality, inferior wall -Does not appear to be having active angina, no significant troponin elevation EKG appearing relatively unchanged --cardiology consulted, ischemic eval per cardio --continue metoprolol succinate 25 daily  Right pleural effusion Small effusion on CAT scan.  No current indication for thoracentesis.  No clinical indication of bacterial pneumonia.     Abnormal LFTs This is chronic.  Infectious workup has previously been done.  Patient may have cirrhosis based on the liver ultrasound that we have on file from May 20.  Outpatient follow-up   ESRD (end stage renal disease) on dialysis [facility]) Nephrology consulted for inpatient  hemodialysis --iHD per nephro   PAF (paroxysmal atrial fibrillation) (HCC) Maintaining normal sinus rhythm -Bleeding at dialysis catheter sites on Eliquis 5 twice daily so had reduced Eliquis 2.5 twice daily --cont metop  Hx of Type II diabetes mellitus, not currently active --A1c wnl for the past 7 months.  Not on home hypoglycemics.   Status post bilateral lower extremity amputations No acute issues    DVT prophylaxis: WJ:XBJYNWG Code Status: Full code  Family Communication:  Level of care: Med-Surg Dispo:   The patient is from: home Anticipated d/c is to: home Anticipated d/c date is: 1-2 days   Subjective and Interval History:  Pt reported waking up feeling fine, then later became short of breath, associated with anxiety.   Objective: Vitals:   01/18/23 1130 01/18/23 1200 01/18/23 1221 01/18/23 1257  BP: 136/77 128/80 131/79   Pulse: 81 86 84   Resp: (!) 22 (!) 34 (!) 35   Temp:   97.6 F (36.4 C)   TempSrc:   Oral   SpO2: 98% 95% 91%   Weight:    89.9 kg  Height:        Intake/Output Summary (Last 24 hours) at 01/18/2023 1522 Last data filed at 01/18/2023 1221 Gross per 24 hour  Intake --  Output 0 ml  Net 0 ml   Filed Weights   01/17/23 0113 01/18/23 0823 01/18/23 1257  Weight: 92.5 kg 89.9 kg 89.9 kg    Examination:   Constitutional: NAD, AAOx3 HEENT: conjunctivae and lids normal, EOMI CV: No cyanosis.   RESP: normal respiratory effort, on RA Extremities: bilateral prosthetic legs Neuro: II - XII grossly intact.   Psych: Normal mood and affect.  Appropriate judgement and reason   Data Reviewed: I have personally reviewed labs and imaging studies  Time spent: 50 minutes  Darlin Priestly, MD [facility] If 7PM-7AM, please contact night-coverage 01/18/2023, 3:22 PM

## 2023-01-18 NOTE — Progress Notes (Signed)
Patient transported to dialysis in wheelchair with transport assist in stable condition.

## 2023-01-18 NOTE — Progress Notes (Signed)
Rounding Note    Patient Name: Cindy Osborne Date of Encounter: 01/18/2023  [facility] Cardiologist: Debbe Odea, MD   Subjective   Resting comfortably having dialysis this morning Reports continued shortness of breath, cough Rhinovirus positive Talked to hemodialysis tech, she is close to her dry weight, not pulling much fluid off today  Inpatient Medications    Scheduled Meds:  benzonatate  200 mg Oral TID   Chlorhexidine Gluconate Cloth  6 each Topical Q0600   feeding supplement  237 mL Oral BID BM   guaiFENesin  600 mg Oral BID   hydrOXYzine  10 mg Oral TID   metoprolol succinate  25 mg Oral Daily   midodrine  5 mg Oral TID with meals   multivitamin  1 tablet Oral QHS   sodium chloride flush  3 mL Intravenous Q12H   Continuous Infusions:  anticoagulant sodium citrate     azithromycin 500 mg (01/18/23 0023)   PRN Meds: acetaminophen **OR** acetaminophen, ALPRAZolam, alteplase, anticoagulant sodium citrate, chlorpheniramine-HYDROcodone, heparin, ipratropium-albuterol, lactulose, lidocaine (PF), lidocaine-prilocaine, ondansetron (ZOFRAN) IV, pentafluoroprop-tetrafluoroeth, traZODone   Vital Signs    Vitals:   01/18/23 1100 01/18/23 1130 01/18/23 1200 01/18/23 1221  BP: (!) 138/99 136/77 128/80 131/79  Pulse: 80 81 86 84  Resp: 18 (!) 22 (!) 34 (!) 35  Temp:    97.6 F (36.4 C)  TempSrc:    Oral  SpO2: 98% 98% 95% 91%  Weight:      Height:       No intake or output data in the 24 hours ending 01/18/23 1239    01/18/2023    8:23 AM 01/17/2023    1:13 AM 01/16/2023    6:28 PM  Last 3 Weights  Weight (lbs) 198 lb 3.1 oz 204 lb 204 lb  Weight (kg) 89.9 kg 92.534 kg 92.534 kg      Telemetry    Normal sinus rhythm- Personally Reviewed  ECG    - Personally Reviewed  Physical Exam   GEN: No acute distress.   Neck:  JVD 8 Cardiac: RRR, no murmurs, rubs, or gallops.  Respiratory: Coarse breath sounds GI: Soft, nontender, non-distended   MS: No edema; No deformity. Neuro:  Nonfocal  Psych: Normal affect   Labs    High Sensitivity Troponin:   Recent Labs  Lab 01/01/23 1637 01/17/23 1508 01/17/23 1630  TROPONINIHS 63* 128* 127*     Chemistry Recent Labs  Lab 01/16/23 1842 01/17/23 0335 01/18/23 0840  NA 134* 135 136  K 3.2* 4.0 4.4  CL 92* 98 97*  CO2 25 28 20*  GLUCOSE 155* 135* 146*  BUN 17 21* 36*  CREATININE 4.20* 4.98* 6.49*  CALCIUM 9.0 8.5* 8.8*  PROT  --  6.8  --   ALBUMIN  --  3.6 3.5  AST  --  85*  --   ALT  --  154*  --   ALKPHOS  --  214*  --   BILITOT  --  3.3*  --   GFRNONAA 12* 10* 7*  ANIONGAP 17* 9 19*    Lipids No results for input(s): "CHOL", "TRIG", "HDL", "LABVLDL", "LDLCALC", "CHOLHDL" in the last 168 hours.  Hematology Recent Labs  Lab 01/16/23 1842 01/17/23 0335 01/18/23 0840  WBC 7.8 6.5 6.0  RBC 3.66* 3.18* 3.24*  HGB 11.0* 9.6* 9.9*  HCT 36.1 31.0* 31.3*  MCV 98.6 97.5 96.6  MCH 30.1 30.2 30.6  MCHC 30.5 31.0 31.6  RDW 18.6* 17.7* 18.1*  PLT 63* 61* 68*   Thyroid No results for input(s): "TSH", "FREET4" in the last 168 hours.  BNPNo results for input(s): "BNP", "PROBNP" in the last 168 hours.  DDimer No results for input(s): "DDIMER" in the last 168 hours.   Radiology    ECHOCARDIOGRAM COMPLETE  Result Date: 01/17/2023    ECHOCARDIOGRAM REPORT   Patient Name:   Cindy Osborne Date of Exam: 01/17/2023 Medical Rec #:  [id number]         Height:       69.0 in Accession #:    [id number]        Weight:       204.0 lb Date of Birth:  28-Jan-1966          BSA:          2.083 m Patient Age:    56 years          BP:           138/86 mmHg Patient Gender: F                 HR:           78 bpm. Exam Location:  [facility] Procedure: 2D Echo, Cardiac Doppler and Color Doppler Indications:     CHF-acute systolic I50.21  History:         Patient has prior history of Echocardiogram examinations, most                  recent 01/22/2022. Stroke; Risk Factors:Dyslipidemia and                   Hypertension.  Sonographer:     Cristela Blue Referring Phys:  [id number] [facility] Diagnosing Phys: Debbe Odea MD IMPRESSIONS  1. Left ventricular ejection fraction, by estimation, is 35 to 40%. The left ventricle has moderately decreased function. The left ventricle has no regional wall motion abnormalities. There is mild left ventricular hypertrophy. Left ventricular diastolic parameters are consistent with Grade II diastolic dysfunction (pseudonormalization).  2. Right ventricular systolic function was not well visualized. The right ventricular size is not well visualized.  3. The mitral valve is degenerative. Moderate mitral valve regurgitation. Moderate mitral annular calcification.  4. The aortic valve is calcified. Aortic valve regurgitation is not visualized. FINDINGS  Left Ventricle: Left ventricular ejection fraction, by estimation, is 35 to 40%. The left ventricle has moderately decreased function. The left ventricle has no regional wall motion abnormalities. The left ventricular internal cavity size was normal in size. There is mild left ventricular hypertrophy. Left ventricular diastolic parameters are consistent with Grade II diastolic dysfunction (pseudonormalization). Right Ventricle: The right ventricular size is not well visualized. No increase in right ventricular wall thickness. Right ventricular systolic function was not well visualized. Left Atrium: Left atrial size was normal in size. Right Atrium: Right atrial size was normal in size. Pericardium: There is no evidence of pericardial effusion. Mitral Valve: The mitral valve is degenerative in appearance. Moderate mitral annular calcification. Moderate mitral valve regurgitation. MV peak gradient, 8.8 mmHg. The mean mitral valve gradient is 3.0 mmHg. Tricuspid Valve: The tricuspid valve is not well visualized. Tricuspid valve regurgitation is not demonstrated. Aortic Valve: The aortic valve is calcified. Aortic valve regurgitation is not  visualized. Aortic valve mean gradient measures 10.0 mmHg. Aortic valve peak gradient measures 17.0 mmHg. Aortic valve area, by VTI measures 0.96 cm. Pulmonic Valve: The pulmonic valve was not well visualized. Pulmonic valve regurgitation is not visualized. Aorta:  The aortic root is normal in size and structure. Venous: The inferior vena cava was not well visualized. IAS/Shunts: No atrial level shunt detected by color flow Doppler.  LEFT VENTRICLE PLAX 2D LVIDd:         5.60 cm      Diastology LVIDs:         4.80 cm      LV e' medial:    5.00 cm/s LV PW:         1.00 cm      LV E/e' medial:  31.6 LV IVS:        1.30 cm      LV e' lateral:   8.05 cm/s LVOT diam:     2.00 cm      LV E/e' lateral: 19.6 LV SV:         39 LV SV Index:   19 LVOT Area:     3.14 cm  LV Volumes (MOD) LV vol d, MOD A4C: 115.0 ml LV vol s, MOD A4C: 114.0 ml LV SV MOD A4C:     115.0 ml RIGHT VENTRICLE RV S prime:     9.68 cm/s TAPSE (M-mode): 1.8 cm LEFT ATRIUM             Index        RIGHT ATRIUM           Index LA diam:        4.60 cm 2.21 cm/m   RA Area:     19.60 cm LA Vol (A2C):   51.1 ml 24.53 ml/m  RA Volume:   55.50 ml  26.64 ml/m LA Vol (A4C):   72.1 ml 34.61 ml/m LA Biplane Vol: 65.2 ml 31.29 ml/m  AORTIC VALVE AV Area (Vmax):    0.97 cm AV Area (Vmean):   0.82 cm AV Area (VTI):     0.96 cm AV Vmax:           206.33 cm/s AV Vmean:          149.333 cm/s AV VTI:            0.410 m AV Peak Grad:      17.0 mmHg AV Mean Grad:      10.0 mmHg LVOT Vmax:         63.90 cm/s LVOT Vmean:        38.800 cm/s LVOT VTI:          0.125 m LVOT/AV VTI ratio: 0.30  AORTA Ao Root diam: 2.70 cm MITRAL VALVE MV Area (PHT): 5.75 cm     SHUNTS MV Area VTI:   1.03 cm     Systemic VTI:  0.12 m MV Peak grad:  8.8 mmHg     Systemic Diam: 2.00 cm MV Mean grad:  3.0 mmHg MV Vmax:       1.48 m/s MV Vmean:      79.3 cm/s MV Decel Time: 132 msec MV E velocity: 158.00 cm/s MV A velocity: 92.20 cm/s MV E/A ratio:  1.71 Debbe Odea MD Electronically  signed by Debbe Odea MD Signature Date/Time: 01/17/2023/12:49:10 PM    Final    CT CHEST WO CONTRAST  Result Date: 01/16/2023 CLINICAL DATA:  Pleural effusion, known or suspected. Weakness beginning on Sunday. EXAM: CT CHEST WITHOUT CONTRAST TECHNIQUE: Multidetector CT imaging of the chest was performed following the standard protocol without IV contrast. RADIATION DOSE REDUCTION: This exam was performed according to the departmental dose-optimization program which includes automated exposure control,  adjustment of the mA and/or kV according to patient size and/or use of iterative reconstruction technique. COMPARISON:  Chest radiograph 01/16/2023.  CT 05/30/2022 FINDINGS: Cardiovascular: Cardiac enlargement. No pericardial effusions. Calcification of the coronary arteries and aorta. No aortic aneurysm. Mediastinum/Nodes: Thyroid gland is unremarkable. Esophagus is decompressed. Mediastinal lymph nodes are prominent, with right paratracheal nodes measuring up to about 12 mm short axis dimension. Lymph nodes are similar to prior study, most likely reactive. Lungs/Pleura: Small right pleural effusion is decreased since previous study. Atelectasis or consolidation in the right lung base. Scattered emphysematous changes in the lungs. No pneumothorax. Upper Abdomen: No acute abnormalities demonstrated. Severe and diffuse vascular calcifications throughout the upper abdomen. Musculoskeletal: Degenerative changes in the spine. IMPRESSION: 1. Small right pleural effusion with consolidation or atelectasis in the right lung base. Mild improvement since prior CT. For 2 diffuse cardiac enlargement. 2. Aortic atherosclerosis with severe vascular calcification throughout the chest and abdomen. 3. Stable appearance of mildly enlarged mediastinal lymph nodes, likely reactive. 4. Emphysematous changes in the lungs. Electronically Signed   By: Burman Nieves M.D.   On: 01/16/2023 23:46   DG Chest Port 1 View  Result  Date: 01/16/2023 CLINICAL DATA:  Weakness and Saturday EXAM: PORTABLE CHEST 1 VIEW COMPARISON:  Radiographs 01/11/2023 FINDINGS: Stable cardiomegaly. Airspace and interstitial opacities in the right mid and lower lung. Moderate right pleural effusion. The left lung is clear. No pneumothorax. No displaced rib fractures. IMPRESSION: Airspace and interstitial opacities in the right mid and lower lung with moderate right pleural effusion similar to prior. Cardiomegaly. Electronically Signed   By: Minerva Fester M.D.   On: 01/16/2023 19:20    Cardiac Studies     Patient Profile     Ms. Cindy Osborne is a 57 year old woman with history of end-stage renal disease on hemodialysis Tuesday Thursday Saturday, paroxysmal atrial fibrillation on Eliquis, hypertension, hyperlipidemia, history of stroke, diabetes, chronic right pleural effusion, PAD with bilateral below-knee amputations presenting to the hospital with weakness, shortness of breath   Assessment & Plan    Acute respiratory distress, rhinovirus Testing positive this admission for rhinovirus, received short course antibiotics Likely contributing to some of her shortness of breath symptoms   Cardiomyopathy Drop in ejection fraction 35 to 40% down from normal EF on prior study Unable to exclude underlying ischemia, unable to exclude focal wall motion abnormality, inferior wall -Does not appear to be having active angina, no significant troponin elevation EKG appearing relatively unchanged -Discussed with nephrology various treatment options -Could consider cardiac catheterization She would like to talk with family before any procedures 1 family member on the phone recommended waiting until rhinovirus symptoms had improved Will need to monitor low platelets in the 60s, Eliquis on hold Will continue metoprolol succinate 25 daily   Proximal  atrial fibrillation Maintaining normal sinus rhythm -Bleeding at dialysis catheter sites on Eliquis  5 twice daily so had reduced Eliquis 2.5 twice daily -Appears she has been referred to EP by primary cardiologist  could consider Watchman device if unable to tolerate therapeutic dosing Early May 2024 visit to the emergency room was started on amiodarone, she did not continue this   End-stage renal disease on hemodialysis Tuesday Thursday Saturday Managed by nephrology Considering restart of diuretics/torsemide on nondialysis days   Hyperlipidemia Statin on hold in the setting of elevated LFTs,  Could consider transition to PCSK9 inhibitor   Transaminitis Total bilirubin 3.0, alk phos 186, AST ALT trending downward Possibly secondary to cirrhosis   Total  encounter time more than 50 minutes  Greater than 50% was spent in counseling and coordination of care with the patient   For questions or updates, please contact [facility] Please consult [email or web address] for contact info under        Signed, Julien Nordmann, MD  01/18/2023, 12:39 PM

## 2023-01-19 DIAGNOSIS — J90 Pleural effusion, not elsewhere classified: Secondary | ICD-10-CM | POA: Diagnosis not present

## 2023-01-19 LAB — CBC
HCT: 34 % — ABNORMAL LOW (ref 36.0–46.0)
Hemoglobin: 10.5 g/dL — ABNORMAL LOW (ref 12.0–15.0)
MCH: 30.2 pg (ref 26.0–34.0)
MCHC: 30.9 g/dL (ref 30.0–36.0)
MCV: 97.7 fL (ref 80.0–100.0)
Platelets: 68 10*3/uL — ABNORMAL LOW (ref 150–400)
RBC: 3.48 MIL/uL — ABNORMAL LOW (ref 3.87–5.11)
RDW: 18.1 % — ABNORMAL HIGH (ref 11.5–15.5)
WBC: 6 10*3/uL (ref 4.0–10.5)
nRBC: 0.5 % — ABNORMAL HIGH (ref 0.0–0.2)

## 2023-01-19 LAB — BASIC METABOLIC PANEL
Anion gap: 16 — ABNORMAL HIGH (ref 5–15)
BUN: 29 mg/dL — ABNORMAL HIGH (ref 6–20)
CO2: 27 mmol/L (ref 22–32)
Calcium: 9.1 mg/dL (ref 8.9–10.3)
Chloride: 95 mmol/L — ABNORMAL LOW (ref 98–111)
Creatinine, Ser: 4.81 mg/dL — ABNORMAL HIGH (ref 0.44–1.00)
GFR, Estimated: 10 mL/min — ABNORMAL LOW (ref >60)
Glucose, Bld: 141 mg/dL — ABNORMAL HIGH (ref 70–99)
Potassium: 4.4 mmol/L (ref 3.5–5.1)
Sodium: 138 mmol/L (ref 135–145)

## 2023-01-19 LAB — CULTURE, BLOOD (ROUTINE X 2)

## 2023-01-19 LAB — MAGNESIUM: Magnesium: 2.3 mg/dL (ref 1.7–2.4)

## 2023-01-19 LAB — PROCALCITONIN: Procalcitonin: 0.77 ng/mL

## 2023-01-19 MED ORDER — APIXABAN 2.5 MG PO TABS
2.5000 mg | ORAL_TABLET | Freq: Two times a day (BID) | ORAL | Status: DC
  Administered 2023-01-19 – 2023-01-20 (×3): 2.5 mg via ORAL
  Filled 2023-01-19 (×3): qty 1

## 2023-01-19 MED ORDER — IPRATROPIUM-ALBUTEROL 0.5-2.5 (3) MG/3ML IN SOLN
3.0000 mL | Freq: Two times a day (BID) | RESPIRATORY_TRACT | Status: DC
  Administered 2023-01-19 – 2023-01-20 (×2): 3 mL via RESPIRATORY_TRACT
  Filled 2023-01-19 (×2): qty 3

## 2023-01-19 MED ORDER — TORSEMIDE 20 MG PO TABS
20.0000 mg | ORAL_TABLET | ORAL | Status: DC
  Administered 2023-01-19: 20 mg via ORAL
  Filled 2023-01-19: qty 1

## 2023-01-19 MED ORDER — ACETYLCYSTEINE 20 % IN SOLN
4.0000 mL | Freq: Two times a day (BID) | RESPIRATORY_TRACT | Status: DC
  Administered 2023-01-19 – 2023-01-20 (×2): 4 mL via RESPIRATORY_TRACT
  Filled 2023-01-19 (×2): qty 4

## 2023-01-19 MED ORDER — PREDNISONE 20 MG PO TABS
40.0000 mg | ORAL_TABLET | Freq: Every day | ORAL | Status: DC
  Administered 2023-01-19 – 2023-01-20 (×2): 40 mg via ORAL
  Filled 2023-01-19 (×2): qty 2

## 2023-01-19 NOTE — Progress Notes (Addendum)
[facility]  ROUNDING NOTE   Subjective:   Cindy Osborne is a 57 y.o. female with past medical history including hypertension, hyperlipidemia, stroke, diabetes, and end stage renal disease on ;hemodialysis. Patient presented to the ED with prolonged fatigue and has been admitted for Pleural effusion [J90] CHF (congestive heart failure) (HCC) [I50.9]  Patient is known to our practice and receives outpatient dialysis treatments at [facility] on a TTS schedule, supervised by Dr. Wynelle Link.    Patient seen sitting up in bed Alert and oriented Tolerating meals without nausea or vomiting   Objective:  Vital signs in last 24 hours:  Temp:  [97.6 F (36.4 C)-97.9 F (36.6 C)] 97.6 F (36.4 C) (06/07 0740) Pulse Rate:  [73-80] 73 (06/07 0740) Resp:  [18-24] 18 (06/07 0740) BP: (121-136)/(71-84) 131/75 (06/07 0740) SpO2:  [98 %-100 %] 98 % (06/07 0740) Weight:  [89.9 kg] 89.9 kg (06/06 1257)  Weight change:  Filed Weights   01/17/23 0113 01/18/23 0823 01/18/23 1257  Weight: 92.5 kg 89.9 kg 89.9 kg    Intake/Output: I/O last 3 completed shifts: In: 240 [P.O.:240] Out: 0    Intake/Output this shift:  No intake/output data recorded.  Physical Exam: General: NAD  Head: Normocephalic, atraumatic. Moist oral mucosal membranes  Eyes: Anicteric  Lungs:  Clear to auscultation, normal effort  Heart: Regular rate and rhythm  Abdomen:  Soft, nontender,   Extremities: No peripheral edema, bilateral BKA  Neurologic: Nonfocal, moving all four extremities  Skin: No lesions  Access: Left aVF    Basic Metabolic Panel: Recent Labs  Lab 01/16/23 1842 01/17/23 0335 01/18/23 0840 01/18/23 1305 01/19/23 0639  NA 134* 135 136 138 138  K 3.2* 4.0 4.4 4.1 4.4  CL 92* 98 97* 95* 95*  CO2 25 28 20* 26 27  GLUCOSE 155* 135* 146* 116* 141*  BUN 17 21* 36* 16 29*  CREATININE 4.20* 4.98* 6.49* 3.29* 4.81*  CALCIUM 9.0 8.5* 8.8* 8.8* 9.1  MG  --   --   --   --  2.3   PHOS  --  3.4 4.8* 2.8  --      Liver Function Tests: Recent Labs  Lab 01/17/23 0335 01/18/23 0840 01/18/23 1305  AST 85*  --   --   ALT 154*  --   --   ALKPHOS 214*  --   --   BILITOT 3.3*  --   --   PROT 6.8  --   --   ALBUMIN 3.6 3.5 3.5    No results for input(s): "LIPASE", "AMYLASE" in the last 168 hours.  No results for input(s): "AMMONIA" in the last 168 hours.  CBC: Recent Labs  Lab 01/16/23 1842 01/17/23 0335 01/18/23 0840 01/18/23 1305 01/19/23 0639  WBC 7.8 6.5 6.0 7.6 6.0  HGB 11.0* 9.6* 9.9* 10.5* 10.5*  HCT 36.1 31.0* 31.3* 33.7* 34.0*  MCV 98.6 97.5 96.6 97.7 97.7  PLT 63* 61* 68* 58* 68*     Cardiac Enzymes: No results for input(s): "CKTOTAL", "CKMB", "CKMBINDEX", "TROPONINI" in the last 168 hours.  BNP: Invalid input(s): "POCBNP"  CBG: Recent Labs  Lab 01/17/23 1220  GLUCAP 149*     Microbiology: Results for orders placed or performed during the hospital encounter of 01/16/23  Blood Culture (routine x 2)     Status: None (Preliminary result)   Collection Time: 01/16/23  6:41 PM   Specimen: BLOOD  Result Value Ref Range Status   Specimen Description BLOOD BLOOD RIGHT  ARM  Final   Special Requests   Final    BOTTLES DRAWN AEROBIC AND ANAEROBIC Blood Culture adequate volume   Culture   Final    NO GROWTH 3 DAYS Performed at [facility], [address] [place], [place] [address]    Report Status PENDING  Incomplete  Blood Culture (routine x 2)     Status: None (Preliminary result)   Collection Time: 01/16/23 10:39 PM   Specimen: BLOOD  Result Value Ref Range Status   Specimen Description BLOOD BLOOD RIGHT FOREARM  Final   Special Requests   Final    BOTTLES DRAWN AEROBIC AND ANAEROBIC Blood Culture adequate volume   Culture   Final    NO GROWTH 3 DAYS Performed at [facility], [address], [place], [place] [address]    Report Status PENDING  Incomplete  Respiratory (~20 pathogens) panel by PCR      Status: Abnormal   Collection Time: 01/16/23 11:28 PM   Specimen: Nasopharyngeal Swab; Respiratory  Result Value Ref Range Status   Adenovirus NOT DETECTED NOT DETECTED Final   Coronavirus 229E NOT DETECTED NOT DETECTED Final    Comment: (NOTE) The Coronavirus on the Respiratory Panel, DOES NOT test for the novel  Coronavirus (2019 nCoV)    Coronavirus HKU1 NOT DETECTED NOT DETECTED Final   Coronavirus NL63 NOT DETECTED NOT DETECTED Final   Coronavirus OC43 NOT DETECTED NOT DETECTED Final   Metapneumovirus NOT DETECTED NOT DETECTED Final   Rhinovirus / Enterovirus DETECTED (A) NOT DETECTED Final   Influenza A NOT DETECTED NOT DETECTED Final   Influenza B NOT DETECTED NOT DETECTED Final   Parainfluenza Virus 1 NOT DETECTED NOT DETECTED Final   Parainfluenza Virus 2 NOT DETECTED NOT DETECTED Final   Parainfluenza Virus 3 NOT DETECTED NOT DETECTED Final   Parainfluenza Virus 4 NOT DETECTED NOT DETECTED Final   Respiratory Syncytial Virus NOT DETECTED NOT DETECTED Final   Bordetella pertussis NOT DETECTED NOT DETECTED Final   Bordetella Parapertussis NOT DETECTED NOT DETECTED Final   Chlamydophila pneumoniae NOT DETECTED NOT DETECTED Final   Mycoplasma pneumoniae NOT DETECTED NOT DETECTED Final    Comment: Performed at [facility], [address] [place], [place] [address]  SARS Coronavirus 2 by RT PCR (hospital order, performed in [facility] hospital lab) *cepheid single result test* Anterior Nasal Swab     Status: None   Collection Time: 01/17/23 12:43 AM   Specimen: Anterior Nasal Swab  Result Value Ref Range Status   SARS Coronavirus 2 by RT PCR NEGATIVE NEGATIVE Final    Comment: (NOTE) SARS-CoV-2 target nucleic acids are NOT DETECTED.  The SARS-CoV-2 RNA is generally detectable in upper and lower respiratory specimens during the acute phase of infection. The lowest concentration of SARS-CoV-2 viral copies this assay can detect is 250 copies / mL. A negative result  does not preclude SARS-CoV-2 infection and should not be used as the sole basis for treatment or other patient management decisions.  A negative result may occur with improper specimen collection / handling, submission of specimen other than nasopharyngeal swab, presence of viral mutation(s) within the areas targeted by this assay, and inadequate number of viral copies (<250 copies / mL). A negative result must be combined with clinical observations, patient history, and epidemiological information.  Fact Sheet for Patients:   RoadLapTop.co.za  Fact Sheet for Healthcare Providers: http://kim-miller.com/  This test is not yet approved or  cleared by the [place] FDA  and has been authorized for detection and/or diagnosis of SARS-CoV-2 by FDA under an Emergency Use Authorization (EUA).  This EUA will remain in effect (meaning this test can be used) for the duration of the COVID-19 declaration under Section 564(b)(1) of the Act, 21 U.S.C. section 360bbb-3(b)(1), unless the authorization is terminated or revoked sooner.  Performed at [facility], [address] [place], [place] [address]     Coagulation Studies: No results for input(s): "LABPROT", "INR" in the last 72 hours.  Urinalysis: No results for input(s): "COLORURINE", "LABSPEC", "PHURINE", "GLUCOSEU", "HGBUR", "BILIRUBINUR", "KETONESUR", "PROTEINUR", "UROBILINOGEN", "NITRITE", "LEUKOCYTESUR" in the last 72 hours.  Invalid input(s): "APPERANCEUR"    Imaging: No results found.   Medications:    anticoagulant sodium citrate     azithromycin Stopped (01/19/23 0631)    apixaban  2.5 mg Oral BID   benzonatate  200 mg Oral TID   Chlorhexidine Gluconate Cloth  6 each Topical Q0600   feeding supplement  237 mL Oral BID BM   guaiFENesin  600 mg Oral BID   hydrOXYzine  10 mg Oral TID   metoprolol succinate  25 mg Oral Daily   midodrine  5 mg Oral TID with meals    multivitamin  1 tablet Oral QHS   sodium chloride flush  3 mL Intravenous Q12H   torsemide  20 mg Oral Q M,W,F   acetaminophen **OR** acetaminophen, ALPRAZolam, alteplase, anticoagulant sodium citrate, chlorpheniramine-HYDROcodone, heparin, ipratropium-albuterol, lactulose, lidocaine (PF), lidocaine-prilocaine, ondansetron (ZOFRAN) IV, pentafluoroprop-tetrafluoroeth, traZODone  Assessment/ Plan:  Ms. Cindy Osborne is a 57 y.o.  female with past medical history including hypertension, hyperlipidemia, stroke, diabetes, and end stage renal disease on ;hemodialysis. Patient presented to the ED with prolonged fatigue and has been admitted for Pleural effusion [J90] CHF (congestive heart failure) (HCC) [I50.9]  [facility] [place]/TTS/ Lt AVF/ 90.0kg  End stage renal disease requiring hemodialysis.   Dialysis received yesterday with patient seated in chair. UF 0. Next treatment scheduled for Saturday with low UF.   Will order Torsemide 40mg  every MWF.   2. Anemia of chronic kidney disease Lab Results  Component Value Date   HGB 10.5 (L) 01/19/2023    Hemoglobin remains stable. No need for ESA outpatient.   3. Secondary Hyperparathyroidism: with outpatient labs: PTH 2564, phosphorus 6.2, calcium 9.0 on 12/19/22.   Lab Results  Component Value Date   CALCIUM 9.1 01/19/2023   CAION 0.90 (L) 05/27/2022   PHOS 2.8 01/18/2023    Patient prescribed Velphoro outpatient with meals  4. Diabetes mellitus type II with chronic kidney disease/renal manifestations: insulin dependent. Home regimen includes Humalog and Lantus. Most recent hemoglobin A1c is 5.9 on 10/17/22.     LOS: 2 Cindy Osborne 6/7/202412:34 PM

## 2023-01-19 NOTE — Progress Notes (Signed)
Rounding Note    Patient Name: Cindy Osborne Date of Encounter: 01/19/2023  [facility] Cardiologist: Debbe Odea, MD   Subjective   Continued shortness of breath, cough, some sputum production Rhinovirus positive Prefers not to have cardiac catheterization at this time we get  Inpatient Medications    Scheduled Meds:  apixaban  2.5 mg Oral BID   benzonatate  200 mg Oral TID   Chlorhexidine Gluconate Cloth  6 each Topical Q0600   feeding supplement  237 mL Oral BID BM   guaiFENesin  600 mg Oral BID   hydrOXYzine  10 mg Oral TID   metoprolol succinate  25 mg Oral Daily   midodrine  5 mg Oral TID with meals   multivitamin  1 tablet Oral QHS   sodium chloride flush  3 mL Intravenous Q12H   torsemide  20 mg Oral Q M,W,F   Continuous Infusions:  anticoagulant sodium citrate     azithromycin Stopped (01/19/23 0631)   PRN Meds: acetaminophen **OR** acetaminophen, ALPRAZolam, alteplase, anticoagulant sodium citrate, chlorpheniramine-HYDROcodone, heparin, ipratropium-albuterol, lactulose, lidocaine (PF), lidocaine-prilocaine, ondansetron (ZOFRAN) IV, pentafluoroprop-tetrafluoroeth, traZODone   Vital Signs    Vitals:   01/18/23 1655 01/18/23 2000 01/19/23 0454 01/19/23 0740  BP: 132/83 121/71 136/84 131/75  Pulse: 80 73 74 73  Resp: (!) 22 (!) 24 20 18   Temp: 97.7 F (36.5 C) 97.9 F (36.6 C) 97.6 F (36.4 C) 97.6 F (36.4 C)  TempSrc:      SpO2: 100% 98% 100% 98%  Weight:      Height:        Intake/Output Summary (Last 24 hours) at 01/19/2023 1201 Last data filed at 01/18/2023 1944 Gross per 24 hour  Intake 240 ml  Output 0 ml  Net 240 ml      01/18/2023   12:57 PM 01/18/2023    8:23 AM 01/17/2023    1:13 AM  Last 3 Weights  Weight (lbs) 198 lb 3.1 oz 198 lb 3.1 oz 204 lb  Weight (kg) 89.9 kg 89.9 kg 92.534 kg      Telemetry    Normal sinus rhythm- Personally Reviewed  ECG    - Personally Reviewed  Physical Exam   Constitutional:   oriented to person, place, and time. No distress.  HENT:  Head: Grossly normal Eyes:  no discharge. No scleral icterus.  Neck: No JVD, no carotid bruits  Cardiovascular: Regular rate and rhythm, no murmurs appreciated Pulmonary/Chest: Clear to auscultation bilaterally, no wheezes or rails Abdominal: Soft.  no distension.  no tenderness.  Musculoskeletal: Normal range of motion Neurological:  normal muscle tone. Coordination normal. No atrophy Skin: Skin warm and dry Psychiatric: normal affect, pleasant     EKG encounter angiogram Labs    High Sensitivity Troponin:   Recent Labs  Lab 01/01/23 1637 01/17/23 1508 01/17/23 1630  TROPONINIHS 63* 128* 127*     Chemistry Recent Labs  Lab 01/17/23 0335 01/18/23 0840 01/18/23 1305 01/19/23 0639  NA 135 136 138 138  K 4.0 4.4 4.1 4.4  CL 98 97* 95* 95*  CO2 28 20* 26 27  GLUCOSE 135* 146* 116* 141*  BUN 21* 36* 16 29*  CREATININE 4.98* 6.49* 3.29* 4.81*  CALCIUM 8.5* 8.8* 8.8* 9.1  MG  --   --   --  2.3  PROT 6.8  --   --   --   ALBUMIN 3.6 3.5 3.5  --   AST 85*  --   --   --  ALT 154*  --   --   --   ALKPHOS 214*  --   --   --   BILITOT 3.3*  --   --   --   GFRNONAA 10* 7* 16* 10*  ANIONGAP 9 19* 17* 16*    Lipids No results for input(s): "CHOL", "TRIG", "HDL", "LABVLDL", "LDLCALC", "CHOLHDL" in the last 168 hours.  Hematology Recent Labs  Lab 01/18/23 0840 01/18/23 1305 01/19/23 0639  WBC 6.0 7.6 6.0  RBC 3.24* 3.45* 3.48*  HGB 9.9* 10.5* 10.5*  HCT 31.3* 33.7* 34.0*  MCV 96.6 97.7 97.7  MCH 30.6 30.4 30.2  MCHC 31.6 31.2 30.9  RDW 18.1* 18.2* 18.1*  PLT 68* 58* 68*   Thyroid No results for input(s): "TSH", "FREET4" in the last 168 hours.  BNPNo results for input(s): "BNP", "PROBNP" in the last 168 hours.  DDimer No results for input(s): "DDIMER" in the last 168 hours.   Radiology    No results found.  Cardiac Studies     Patient Profile     Ms. Cindy Osborne is a 57 year old woman with  history of end-stage renal disease on hemodialysis Tuesday Thursday Saturday, paroxysmal atrial fibrillation on Eliquis, hypertension, hyperlipidemia, history of stroke, diabetes, chronic right pleural effusion, PAD with bilateral below-knee amputations presenting to the hospital with weakness, shortness of breath   Assessment & Plan    Acute respiratory distress, rhinovirus Testing positive this admission for rhinovirus, received short course antibiotics Continued shortness of breath, cough, sputum   Cardiomyopathy Drop in ejection fraction 35 to 40% down from normal EF on prior study Unable to exclude underlying ischemia, unable to exclude focal wall motion abnormality, inferior wall She denies active ischemia, only shortness of breath which could be secondary to rhinovirus -Discussed with nephrology various treatment options, given clearance for catheterization She has indicated she does not want catheterization at this time, prefers to wait at least 2 weeks until her lungs are better Will continue metoprolol succinate 25 daily Restart Eliquis in place of aspirin   Proximal  atrial fibrillation Maintaining normal sinus rhythm -Bleeding at dialysis catheter sites on Eliquis 5 twice daily so only takes Eliquis 2.5 twice daily -Appears she has been referred to EP by primary cardiologist Consider Watchman device Early May 2024 visit to the emergency room was started on amiodarone, she did not continue amiodarone and this was not continued on recent visit with general cardiology in clinic   End-stage renal disease on hemodialysis Tuesday Thursday Saturday Managed by nephrology Considering restart of diuretics/torsemide on nondialysis days   Hyperlipidemia Statin on hold in the setting of elevated LFTs,  Could consider transition to PCSK9 inhibitor   Transaminitis Total bilirubin 3.0, alk phos 186, AST ALT trending downward Possibly secondary to cirrhosis  [facility] will  sign off.   Medication Recommendations:   Other recommendations (labs, testing, etc): Outpatient catheterization Follow up as an outpatient: Follow-up with Dr. Azucena Cecil  Discussed risk and benefit of catheterization with her including risk of hematoma, stroke  Total encounter time more than 50 minutes  Greater than 50% was spent in counseling and coordination of care with the patient   For questions or updates, please contact [facility] Please consult [email or web address] for contact info under        Signed, Julien Nordmann, MD  01/19/2023, 12:01 PM

## 2023-01-19 NOTE — Progress Notes (Signed)
PROGRESS NOTE    Cindy Osborne  ZOX:[id number] DOB: 1965-12-20 DOA: 01/16/2023 PCP: Reubin Milan, MD  106A/106A-BB  LOS: 2 days   Brief hospital course:   Assessment & Plan: 57 y.o. female with medical history significant of ESRD on HD. Patient was in her usual state of health till about early may. Since that time patinet reports marked fatigue as a major complaint. It has been interefiring with her acitivites of daily living.  Patient has also been complaining of sob.  She was admited [facility] with diagnosis of " hypoxia" possibly due to a known chornic pleural effuion and discharged. However, patient has conitnued to have this complaint of fatigue.    Patient positive for rhinovirus  Progressive fatigue Dyspnea on exertion Patient presented to the ED 3 times recently for similar complaints.  Unclear etiology.  Likely multifactorial.  Significant coronary atherosclerosis noted on CAT scan.  Possibility of underlying ischemic disease/angina.  Cardiology consulted. Also has rhinovirus infection.   Rhinovirus infection --received azithromycin for anti-inflammatory effect, 3 doses  --due to persistent dyspnea and cough, start prednisone 40 mg daily today for possible bronchitis, for a 5-day course.  Cardiomyopathy Systolic CHF, unknown chronicity  Drop in ejection fraction 35 to 40% down from normal EF on prior study Unable to exclude underlying ischemia, unable to exclude focal wall motion abnormality, inferior wall -Does not appear to be having active angina, no significant troponin elevation EKG appearing relatively unchanged --cardiology consulted, pt deferred heart cath until her lungs feel better. --continue metoprolol succinate 25 daily --f/u with Dr. Azucena Cecil 1-2 weeks after discharge.  Right pleural effusion Small to mod effusion on imaging.   --pt declined thoracentesis.   Abnormal LFTs This is chronic.  Infectious workup has previously been done.   Patient may have cirrhosis based on the liver ultrasound that we have on file from May 20.  Outpatient follow-up   ESRD (end stage renal disease) on dialysis [facility]) Nephrology consulted for inpatient hemodialysis --iHD per nephro   PAF (paroxysmal atrial fibrillation) (HCC) Maintaining normal sinus rhythm -Bleeding at dialysis catheter sites on Eliquis 5 twice daily so had reduced Eliquis 2.5 twice daily --cont metop  Hx of Type II diabetes mellitus, not currently active --A1c wnl for the past 7 months.  Not on home hypoglycemics.   Status post bilateral lower extremity amputations No acute issues    DVT prophylaxis: WJ:[id number] Code Status: Full code  Family Communication:  Level of care: Med-Surg Dispo:   The patient is from: home Anticipated d/c is to: home Anticipated d/c date is: tomorrow, if breathing feels better after starting prednisone.   Subjective and Interval History:  Pt continued to have shortness of breath, also cough, unable to bring sputum up.  Pt told cardio she wanted to defer heart cath.     Objective: Vitals:   01/18/23 2000 01/19/23 0454 01/19/23 0740 01/19/23 1644  BP: 121/71 136/84 131/75 118/71  Pulse: 73 74 73 71  Resp: (!) 24 20 18 18   Temp: 97.9 F (36.6 C) 97.6 F (36.4 C) 97.6 F (36.4 C) (!) 97.4 F (36.3 C)  TempSrc:      SpO2: 98% 100% 98% 94%  Weight:      Height:        Intake/Output Summary (Last 24 hours) at 01/19/2023 1737 Last data filed at 01/18/2023 1944 Gross per 24 hour  Intake 240 ml  Output --  Net 240 ml   Filed Weights   01/17/23 0113  01/18/23 0823 01/18/23 1257  Weight: 92.5 kg 89.9 kg 89.9 kg    Examination:   Constitutional: NAD, AAOx3 HEENT: conjunctivae and lids normal, EOMI CV: No cyanosis.   RESP: normal respiratory effort, on RA Neuro: II - XII grossly intact.   Psych: Normal mood and affect.  Appropriate judgement and reason   Data Reviewed: I have personally reviewed labs and imaging  studies  Time spent: 35 minutes  Darlin Priestly, MD [facility] If 7PM-7AM, please contact night-coverage 01/19/2023, 5:37 PM

## 2023-01-19 NOTE — Care Management Important Message (Signed)
Important Message  Patient Details  Name: Cindy Osborne MRN: [id number] Date of Birth: 09-13-1965   Medicare Important Message Given:  N/A - LOS <3 / Initial given by admissions     Olegario Messier A Oanh Devivo 01/19/2023, 11:17 AM

## 2023-01-20 DIAGNOSIS — R7989 Other specified abnormal findings of blood chemistry: Secondary | ICD-10-CM

## 2023-01-20 DIAGNOSIS — Z992 Dependence on renal dialysis: Secondary | ICD-10-CM

## 2023-01-20 DIAGNOSIS — R531 Weakness: Secondary | ICD-10-CM

## 2023-01-20 DIAGNOSIS — E118 Type 2 diabetes mellitus with unspecified complications: Secondary | ICD-10-CM

## 2023-01-20 DIAGNOSIS — N186 End stage renal disease: Secondary | ICD-10-CM | POA: Diagnosis not present

## 2023-01-20 DIAGNOSIS — I480 Paroxysmal atrial fibrillation: Secondary | ICD-10-CM

## 2023-01-20 DIAGNOSIS — B348 Other viral infections of unspecified site: Secondary | ICD-10-CM

## 2023-01-20 DIAGNOSIS — I420 Dilated cardiomyopathy: Secondary | ICD-10-CM

## 2023-01-20 DIAGNOSIS — J90 Pleural effusion, not elsewhere classified: Secondary | ICD-10-CM | POA: Diagnosis not present

## 2023-01-20 LAB — BASIC METABOLIC PANEL
Anion gap: 20 — ABNORMAL HIGH (ref 5–15)
BUN: 48 mg/dL — ABNORMAL HIGH (ref 6–20)
CO2: 21 mmol/L — ABNORMAL LOW (ref 22–32)
Calcium: 9.3 mg/dL (ref 8.9–10.3)
Chloride: 94 mmol/L — ABNORMAL LOW (ref 98–111)
Creatinine, Ser: 6 mg/dL — ABNORMAL HIGH (ref 0.44–1.00)
GFR, Estimated: 8 mL/min — ABNORMAL LOW (ref >60)
Glucose, Bld: 224 mg/dL — ABNORMAL HIGH (ref 70–99)
Potassium: 4.6 mmol/L (ref 3.5–5.1)
Sodium: 135 mmol/L (ref 135–145)

## 2023-01-20 LAB — CBC
HCT: 34.6 % — ABNORMAL LOW (ref 36.0–46.0)
HCT: 32.8 % — ABNORMAL LOW (ref 36.0–46.0)
Hemoglobin: 10.6 g/dL — ABNORMAL LOW (ref 12.0–15.0)
Hemoglobin: 10.2 g/dL — ABNORMAL LOW (ref 12.0–15.0)
MCH: 30 pg (ref 26.0–34.0)
MCH: 30 pg (ref 26.0–34.0)
MCHC: 30.6 g/dL (ref 30.0–36.0)
MCHC: 31.1 g/dL (ref 30.0–36.0)
MCV: 98 fL (ref 80.0–100.0)
MCV: 96.5 fL (ref 80.0–100.0)
Platelets: 84 10*3/uL — ABNORMAL LOW (ref 150–400)
Platelets: 78 10*3/uL — ABNORMAL LOW (ref 150–400)
RBC: 3.53 MIL/uL — ABNORMAL LOW (ref 3.87–5.11)
RBC: 3.4 MIL/uL — ABNORMAL LOW (ref 3.87–5.11)
RDW: 18.1 % — ABNORMAL HIGH (ref 11.5–15.5)
RDW: 18 % — ABNORMAL HIGH (ref 11.5–15.5)
WBC: 5.5 10*3/uL (ref 4.0–10.5)
WBC: 7.2 10*3/uL (ref 4.0–10.5)
nRBC: 0.7 % — ABNORMAL HIGH (ref 0.0–0.2)
nRBC: 0.7 % — ABNORMAL HIGH (ref 0.0–0.2)

## 2023-01-20 LAB — RENAL FUNCTION PANEL
Albumin: 3.6 g/dL (ref 3.5–5.0)
Anion gap: 20 — ABNORMAL HIGH (ref 5–15)
BUN: 52 mg/dL — ABNORMAL HIGH (ref 6–20)
CO2: 18 mmol/L — ABNORMAL LOW (ref 22–32)
Calcium: 9.2 mg/dL (ref 8.9–10.3)
Chloride: 93 mmol/L — ABNORMAL LOW (ref 98–111)
Creatinine, Ser: 6.15 mg/dL — ABNORMAL HIGH (ref 0.44–1.00)
GFR, Estimated: 7 mL/min — ABNORMAL LOW (ref >60)
Glucose, Bld: 251 mg/dL — ABNORMAL HIGH (ref 70–99)
Phosphorus: 6.4 mg/dL — ABNORMAL HIGH (ref 2.5–4.6)
Potassium: 4.6 mmol/L (ref 3.5–5.1)
Sodium: 131 mmol/L — ABNORMAL LOW (ref 135–145)

## 2023-01-20 LAB — CULTURE, BLOOD (ROUTINE X 2): Culture: NO GROWTH

## 2023-01-20 LAB — MAGNESIUM: Magnesium: 2.5 mg/dL — ABNORMAL HIGH (ref 1.7–2.4)

## 2023-01-20 MED ORDER — PREDNISONE 20 MG PO TABS: 40.0000 mg | ORAL_TABLET | Freq: Every day | ORAL | 0 refills | Status: AC

## 2023-01-20 MED ORDER — BENZONATATE 200 MG PO CAPS: 200.0000 mg | ORAL_CAPSULE | Freq: Three times a day (TID) | ORAL | 0 refills | Status: DC

## 2023-01-20 MED ORDER — ALPRAZOLAM 0.25 MG PO TABS: 0.2500 mg | ORAL_TABLET | Freq: Three times a day (TID) | ORAL | 0 refills | Status: DC | PRN

## 2023-01-20 MED ORDER — RENA-VITE PO TABS: 1.0000 | ORAL_TABLET | Freq: Every day | ORAL | 0 refills | Status: DC

## 2023-01-20 MED ORDER — TORSEMIDE 20 MG PO TABS: 20.0000 mg | ORAL_TABLET | ORAL | 0 refills | Status: DC

## 2023-01-20 MED ORDER — GUAIFENESIN ER 600 MG PO TB12: 600.0000 mg | ORAL_TABLET | Freq: Two times a day (BID) | ORAL | 0 refills | Status: DC | PRN

## 2023-01-20 NOTE — TOC Transition Note (Signed)
Transition of Care [facility]) - CM/SW Discharge Note   Patient Details  Name: Cindy Osborne MRN: [id number] Date of Birth: 1965-12-16  [facility]) CM/SW Contact:  Bing Quarry, RN Phone Number: 01/20/2023, 1:53 PM   Clinical Narrative:  6/8: Discharge orders are in for today. Active with [facility] and has 24/7 care PTA. Requested, received resumption HH orders for PT/OT/RN, and notified [facility] of discharge pending today. Gabriel Cirri RN CM     Final next level of care: Home w Home Health Services Barriers to Discharge: Barriers Resolved   Patient Goals and CMS Choice CMS Medicare.gov Compare Post Acute Care list provided to:: Patient Choice offered to / list presented to : Patient  Discharge Placement                         Discharge Plan and Services Additional resources added to the After Visit Summary for                  DME Arranged: N/A DME Agency: NA       HH Arranged: RN, PT, OT HH Agency: [facility] Date [facility] Agency Contacted: 01/20/23 Time HH Agency Contacted: [phone] Representative spoke with at [facility] Agency: Elnita Maxwell  Social Determinants of Health (SDOH) Interventions SDOH Screenings   Food Insecurity: No Food Insecurity (01/17/2023)  Housing: Low Risk  (01/17/2023)  Transportation Needs: No Transportation Needs (01/17/2023)  Utilities: Not At Risk (01/17/2023)  Alcohol Screen: Low Risk  (11/08/2022)  Depression (PHQ2-9): High Risk (01/15/2023)  Financial Resource Strain: Low Risk  (11/08/2022)  Physical Activity: Inactive (11/08/2022)  Social Connections: Unknown (11/08/2022)  Stress: No Stress Concern Present (11/08/2022)  Tobacco Use: Low Risk  (01/17/2023)     Readmission Risk Interventions     No data to display

## 2023-01-20 NOTE — Progress Notes (Signed)
[facility]  ROUNDING NOTE   Subjective:   Cindy Osborne is a 57 y.o. female with past medical history including hypertension, hyperlipidemia, stroke, diabetes, and end stage renal disease on ;hemodialysis. Patient presented to the ED with prolonged fatigue and has been admitted for Pleural effusion [J90] CHF (congestive heart failure) (HCC) [I50.9]  Patient is known to our practice and receives outpatient dialysis treatments at [facility] on a TTS schedule, supervised by Dr. Wynelle Link.    Patient seen and evaluated during dialysis   HEMODIALYSIS FLOWSHEET:  Blood Flow Rate (mL/min): 400 mL/min Arterial Pressure (mmHg): -160 mmHg Venous Pressure (mmHg): 230 mmHg TMP (mmHg): 12 mmHg Ultrafiltration Rate (mL/min): 971 mL/min Dialysate Flow Rate (mL/min): 300 ml/min  Seated in chair Tolerating treatment well, increased UF 2.5L  Objective:  Vital signs in last 24 hours:  Temp:  [96.6 F (35.9 C)-98.4 F (36.9 C)] 96.6 F (35.9 C) (06/08 0909) Pulse Rate:  [71-84] 84 (06/08 1100) Resp:  [16-25] 24 (06/08 1100) BP: (118-149)/(71-99) 127/75 (06/08 1100) SpO2:  [93 %-100 %] 99 % (06/08 1100) Weight:  [92.3 kg] 92.3 kg (06/08 0909)  Weight change:  Filed Weights   01/18/23 0823 01/18/23 1257 01/20/23 0909  Weight: 89.9 kg 89.9 kg 92.3 kg    Intake/Output: I/O last 3 completed shifts: In: 240 [P.O.:240] Out: -    Intake/Output this shift:  No intake/output data recorded.  Physical Exam: General: NAD  Head: Normocephalic, atraumatic. Moist oral mucosal membranes  Eyes: Anicteric  Lungs:  Clear to auscultation, normal effort  Heart: Regular rate and rhythm  Abdomen:  Soft, nontender  Extremities: No peripheral edema, bilateral BKA  Neurologic: Alert and oriented, moving all four extremities  Skin: No lesions  Access: Left aVF    Basic Metabolic Panel: Recent Labs  Lab 01/17/23 0335 01/18/23 0840 01/18/23 1305 01/19/23 0639  01/20/23 0626 01/20/23 0900  NA 135 136 138 138 135 131*  K 4.0 4.4 4.1 4.4 4.6 4.6  CL 98 97* 95* 95* 94* 93*  CO2 28 20* 26 27 21* 18*  GLUCOSE 135* 146* 116* 141* 224* 251*  BUN 21* 36* 16 29* 48* 52*  CREATININE 4.98* 6.49* 3.29* 4.81* 6.00* 6.15*  CALCIUM 8.5* 8.8* 8.8* 9.1 9.3 9.2  MG  --   --   --  2.3 2.5*  --   PHOS 3.4 4.8* 2.8  --   --  6.4*     Liver Function Tests: Recent Labs  Lab 01/17/23 0335 01/18/23 0840 01/18/23 1305 01/20/23 0900  AST 85*  --   --   --   ALT 154*  --   --   --   ALKPHOS 214*  --   --   --   BILITOT 3.3*  --   --   --   PROT 6.8  --   --   --   ALBUMIN 3.6 3.5 3.5 3.6    No results for input(s): "LIPASE", "AMYLASE" in the last 168 hours.  No results for input(s): "AMMONIA" in the last 168 hours.  CBC: Recent Labs  Lab 01/18/23 0840 01/18/23 1305 01/19/23 0639 01/20/23 0626 01/20/23 0900  WBC 6.0 7.6 6.0 5.5 7.2  HGB 9.9* 10.5* 10.5* 10.6* 10.2*  HCT 31.3* 33.7* 34.0* 34.6* 32.8*  MCV 96.6 97.7 97.7 98.0 96.5  PLT 68* 58* 68* 84* 78*     Cardiac Enzymes: No results for input(s): "CKTOTAL", "CKMB", "CKMBINDEX", "TROPONINI" in the last 168 hours.  BNP: Invalid input(s): "POCBNP"  CBG: Recent Labs  Lab 01/17/23 1220  GLUCAP 149*     Microbiology: Results for orders placed or performed during the hospital encounter of 01/16/23  Blood Culture (routine x 2)     Status: None (Preliminary result)   Collection Time: 01/16/23  6:41 PM   Specimen: BLOOD  Result Value Ref Range Status   Specimen Description BLOOD BLOOD RIGHT ARM  Final   Special Requests   Final    BOTTLES DRAWN AEROBIC AND ANAEROBIC Blood Culture adequate volume   Culture   Final    NO GROWTH 4 DAYS Performed at [facility], [address] [place], [place] [address]    Report Status PENDING  Incomplete  Blood Culture (routine x 2)     Status: None (Preliminary result)   Collection Time: 01/16/23 10:39 PM   Specimen: BLOOD  Result  Value Ref Range Status   Specimen Description BLOOD BLOOD RIGHT FOREARM  Final   Special Requests   Final    BOTTLES DRAWN AEROBIC AND ANAEROBIC Blood Culture adequate volume   Culture   Final    NO GROWTH 4 DAYS Performed at [facility], [address] [place], [place] [address]    Report Status PENDING  Incomplete  Respiratory (~20 pathogens) panel by PCR     Status: Abnormal   Collection Time: 01/16/23 11:28 PM   Specimen: Nasopharyngeal Swab; Respiratory  Result Value Ref Range Status   Adenovirus NOT DETECTED NOT DETECTED Final   Coronavirus 229E NOT DETECTED NOT DETECTED Final    Comment: (NOTE) The Coronavirus on the Respiratory Panel, DOES NOT test for the novel  Coronavirus (2019 nCoV)    Coronavirus HKU1 NOT DETECTED NOT DETECTED Final   Coronavirus NL63 NOT DETECTED NOT DETECTED Final   Coronavirus OC43 NOT DETECTED NOT DETECTED Final   Metapneumovirus NOT DETECTED NOT DETECTED Final   Rhinovirus / Enterovirus DETECTED (A) NOT DETECTED Final   Influenza A NOT DETECTED NOT DETECTED Final   Influenza B NOT DETECTED NOT DETECTED Final   Parainfluenza Virus 1 NOT DETECTED NOT DETECTED Final   Parainfluenza Virus 2 NOT DETECTED NOT DETECTED Final   Parainfluenza Virus 3 NOT DETECTED NOT DETECTED Final   Parainfluenza Virus 4 NOT DETECTED NOT DETECTED Final   Respiratory Syncytial Virus NOT DETECTED NOT DETECTED Final   Bordetella pertussis NOT DETECTED NOT DETECTED Final   Bordetella Parapertussis NOT DETECTED NOT DETECTED Final   Chlamydophila pneumoniae NOT DETECTED NOT DETECTED Final   Mycoplasma pneumoniae NOT DETECTED NOT DETECTED Final    Comment: Performed at [facility], [address] [place], [place] [address]  SARS Coronavirus 2 by RT PCR (hospital order, performed in [facility] hospital lab) *cepheid single result test* Anterior Nasal Swab     Status: None   Collection Time: 01/17/23 12:43 AM   Specimen: Anterior Nasal Swab  Result Value  Ref Range Status   SARS Coronavirus 2 by RT PCR NEGATIVE NEGATIVE Final    Comment: (NOTE) SARS-CoV-2 target nucleic acids are NOT DETECTED.  The SARS-CoV-2 RNA is generally detectable in upper and lower respiratory specimens during the acute phase of infection. The lowest concentration of SARS-CoV-2 viral copies this assay can detect is 250 copies / mL. A negative result does not preclude SARS-CoV-2 infection and should not be used as the sole basis for treatment or other patient management decisions.  A negative result may occur with improper specimen collection / handling, submission of specimen other than nasopharyngeal  swab, presence of viral mutation(s) within the areas targeted by this assay, and inadequate number of viral copies (<250 copies / mL). A negative result must be combined with clinical observations, patient history, and epidemiological information.  Fact Sheet for Patients:   RoadLapTop.co.za  Fact Sheet for Healthcare Providers: http://kim-miller.com/  This test is not yet approved or  cleared by the [place] FDA and has been authorized for detection and/or diagnosis of SARS-CoV-2 by FDA under an Emergency Use Authorization (EUA).  This EUA will remain in effect (meaning this test can be used) for the duration of the COVID-19 declaration under Section 564(b)(1) of the Act, 21 U.S.C. section 360bbb-3(b)(1), unless the authorization is terminated or revoked sooner.  Performed at [facility], [address] [place], [place] [address]     Coagulation Studies: No results for input(s): "LABPROT", "INR" in the last 72 hours.  Urinalysis: No results for input(s): "COLORURINE", "LABSPEC", "PHURINE", "GLUCOSEU", "HGBUR", "BILIRUBINUR", "KETONESUR", "PROTEINUR", "UROBILINOGEN", "NITRITE", "LEUKOCYTESUR" in the last 72 hours.  Invalid input(s): "APPERANCEUR"    Imaging: No results  found.   Medications:    anticoagulant sodium citrate      acetylcysteine  4 mL Nebulization BID   apixaban  2.5 mg Oral BID   benzonatate  200 mg Oral TID   Chlorhexidine Gluconate Cloth  6 each Topical Q0600   feeding supplement  237 mL Oral BID BM   guaiFENesin  600 mg Oral BID   hydrOXYzine  10 mg Oral TID   ipratropium-albuterol  3 mL Nebulization BID   metoprolol succinate  25 mg Oral Daily   midodrine  5 mg Oral TID with meals   multivitamin  1 tablet Oral QHS   predniSONE  40 mg Oral Q breakfast   sodium chloride flush  3 mL Intravenous Q12H   torsemide  20 mg Oral Q M,W,F   acetaminophen **OR** acetaminophen, ALPRAZolam, alteplase, anticoagulant sodium citrate, chlorpheniramine-HYDROcodone, heparin, ipratropium-albuterol, lactulose, lidocaine (PF), lidocaine-prilocaine, ondansetron (ZOFRAN) IV, pentafluoroprop-tetrafluoroeth, traZODone  Assessment/ Plan:  Cindy Osborne is a 57 y.o.  female with past medical history including hypertension, hyperlipidemia, stroke, diabetes, and end stage renal disease on ;hemodialysis. Patient presented to the ED with prolonged fatigue and has been admitted for Pleural effusion [J90] CHF (congestive heart failure) (HCC) [I50.9]  [facility] [place]/TTS/ Lt AVF/ 90.0kg  End stage renal disease requiring hemodialysis.   Receiving dialysis today, UF goal increased to 2.5L as tolerated. Next treatment scheduled for Tuesday.    2. Anemia of chronic kidney disease Lab Results  Component Value Date   HGB 10.2 (L) 01/20/2023    Hemoglobin within desired range. No need for ESA outpatient.   3. Secondary Hyperparathyroidism: with outpatient labs: PTH 2564, phosphorus 6.2, calcium 9.0 on 12/19/22.   Lab Results  Component Value Date   CALCIUM 9.2 01/20/2023   CAION 0.90 (L) 05/27/2022   PHOS 6.4 (H) 01/20/2023  Phosphorus elevated today. Will monitor and encourage dietary changes.  Patient prescribed Velphoro outpatient with  meals  4. Diabetes mellitus type II with chronic kidney disease/renal manifestations: insulin dependent. Home regimen includes Humalog and Lantus. Most recent hemoglobin A1c is 5.9 on 10/17/22. Diet controlled    LOS: 3 Cindy Osborne 6/8/202411:24 AM

## 2023-01-20 NOTE — Discharge Summary (Signed)
Physician Discharge Summary   Patient: Cindy Osborne MRN: [id number] DOB: January 31, 1966  Admit date:     01/16/2023  Discharge date: 01/20/23  Discharge Physician: Arnetha Courser   PCP: Reubin Milan, MD   Recommendations at discharge:  Please obtain CBC and BMP in 1 week Patient is being discharged on 5 days of prednisone-please ensure the completion of course. Follow-up with primary care provider within a week Continue with routine dialysis Follow-up with cardiology in 1 to 2 weeks.  Patient need cardiac cath and ischemic workup.  Discharge Diagnoses: Principal Problem:   Pleural effusion on right Active Problems:   Pneumonia of right lung due to infectious organism   Abnormal LFTs   ESRD (end stage renal disease) on dialysis (HCC)   PAF (paroxysmal atrial fibrillation) (HCC)   Type II diabetes mellitus with complication (HCC)   Generalized weakness   Pleural effusion   CHF (congestive heart failure) (HCC)   Rhinovirus   Dilated cardiomyopathy [facility])   Hospital Course: 57 y.o. female with medical history significant of ESRD on HD. Patient was in her usual state of health till about early may. Since that time patinet reports marked fatigue as a major complaint. It has been interefiring with her acitivites of daily living.  Patient has also been complaining of sob.  [facility] with diagnosis of " hypoxia" possibly due to a known chornic pleural effuion and discharged. However, patient has conitnued to have this complaint of fatigue.   She was also tested positive for rhinovirus which is most likely causing upper respiratory symptoms.  Pneumonia ruled out.  Patient refused thoracentesis.  Having small to moderate right pleural effusion on imaging.  Patient was also found to have decrease in EF to 35 to 40% on echo as compared to the normal EF on prior study.  Patient need ischemic workup.  Cardiology was consulted and they defer cardiac cath as outpatient after  recovering from upper respiratory illness.  Patient need to follow-up with cardiology in 1 to 2 weeks for further recommendations.  Patient also has chronic abnormal LFTs, infectious workup has previously been done.  She was found to have cirrhosis based on liver ultrasound done in May 2024.  Patient need to see a gastroenterologist as an outpatient for further management.  Patient feeling very anxious and seems like having some panic attacks intermittently.  Xanax helped.  She was provided with some Xanax and need to have a close follow-up with primary care provider to help with her anxiety.  Patient will continue on current medications and need to have a close follow-up with her providers for further recommendations.  Assessment and Plan: * Pleural effusion on right Patient refused thoracentesis.  Respiratory viral panel positive for rhinovirus.  Low-dose torsemide on nondialysis days was added  Abnormal LFTs This is chronic.  Infectious workup has previously been done.  Patient may have cirrhosis based on the liver ultrasound that we have on file from May 20.  Outpatient follow-up  ESRD (end stage renal disease) on dialysis [facility]) Patient will continue her routine dialysis.  Received dialysis on the day of discharge.  PAF (paroxysmal atrial fibrillation) (HCC) Remained in sinus rhythm. -Continue home metoprolol and Eliquis  Generalized weakness Is a chronic complaint for the patient at least since early May.  However this is also a prominent complaint in this encounter.  Patient has previously been tested for thyroid cascade and it is negative.  Echocardiogram with new diagnosis of heart  failure with EF of 35 to 40% which can be contributory to fatigue.  Cardiomyopathy Systolic CHF, unknown chronicity  Drop in ejection fraction 35 to 40% down from normal EF on prior study Unable to exclude underlying ischemia, unable to exclude focal wall motion abnormality, inferior wall -Does not  appear to be having active angina, no significant troponin elevation EKG appearing relatively unchanged --cardiology consulted, pt deferred heart cath until her lungs feel better. --continue metoprolol succinate 25 daily --f/u with Dr. Azucena Cecil 1-2 weeks after discharge.  Type II diabetes mellitus with complication (HCC) This apparently is diet controlled as per the patient.  We will monitor routine blood glucose with BMP      Consultants: Cardiology.  Nephrology Procedures performed: Hemodialysis Disposition: Home Diet recommendation:  Discharge Diet Orders (From admission, onward)     Start     Ordered   01/20/23 0000  Diet - low sodium heart healthy        01/20/23 1259           Cardiac and Carb modified diet DISCHARGE MEDICATION: Allergies as of 01/20/2023       Reactions   Augmentin [amoxicillin-pot Clavulanate] Shortness Of Breath   Atorvastatin Other (See Comments)   Back pain   Pioglitazone Other (See Comments)   Edema   Peanut-containing Drug Products Itching   Rosuvastatin Itching, Other (See Comments)   Back pain   Shellfish-derived Products Itching, Rash   shrimp   Sulfa Antibiotics Rash, Other (See Comments)   Shedding of skin        Medication List     STOP taking these medications    hydrOXYzine 10 MG tablet Commonly known as: ATARAX       TAKE these medications    ALPRAZolam 0.25 MG tablet Commonly known as: XANAX Take 1 tablet (0.25 mg total) by mouth 3 (three) times daily as needed for anxiety.   apixaban 2.5 MG Tabs tablet Commonly known as: Eliquis Take 1 tablet (2.5 mg total) by mouth 2 (two) times daily.   benzonatate 200 MG capsule Commonly known as: TESSALON Take 1 capsule (200 mg total) by mouth 3 (three) times daily.   guaiFENesin 600 MG 12 hr tablet Commonly known as: MUCINEX Take 1 tablet (600 mg total) by mouth 2 (two) times daily as needed for to loosen phlegm.   HECTOROL IV Doxercalciferol (Hectorol)    lactulose 10 GM/15ML solution Commonly known as: CHRONULAC Take 30 mLs (20 g total) by mouth daily as needed for mild constipation.   metoprolol succinate 25 MG 24 hr tablet Commonly known as: Toprol XL Take 1 tablet (25 mg total) by mouth daily.   midodrine 10 MG tablet Commonly known as: PROAMATINE Take 5 mg by mouth 3 (three) times daily.   MIRCERA IJ Mircera   multivitamin Tabs tablet Take 1 tablet by mouth at bedtime.   predniSONE 20 MG tablet Commonly known as: DELTASONE Take 2 tablets (40 mg total) by mouth daily with breakfast for 5 days.   Simethicone 500 MG Caps Take 1 tablet by mouth every 8 (eight) hours as needed (gas).   torsemide 20 MG tablet Commonly known as: DEMADEX Take 1 tablet (20 mg total) by mouth every Monday, Wednesday, and Friday. Start taking on: January 22, 2023   traZODone 50 MG tablet Commonly known as: DESYREL Take 1 tablet (50 mg total) by mouth at bedtime as needed for sleep.   Velphoro 500 MG chewable tablet Generic drug: sucroferric oxyhydroxide Chew 500 mg  by mouth 3 (three) times daily with meals.        Follow-up Information     Reubin Milan, MD. Schedule an appointment as soon as possible for a visit in 1 week(s).   Specialty: Internal Medicine Contact information: [address] [place] [address] [phone]                Discharge Exam: Ceasar Mons Weights   01/18/23 9147 01/18/23 1257 01/20/23 0909  Weight: 89.9 kg 89.9 kg 92.3 kg   General.  Well-developed lady, in no acute distress. Pulmonary.  Lungs clear bilaterally, normal respiratory effort. CV.  Regular rate and rhythm, no JVD, rub or murmur. Abdomen.  Soft, nontender, nondistended, BS positive. CNS.  Alert and oriented .  No focal neurologic deficit. Extremities.  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  Judgment and insight appears normal.   Condition at discharge: stable  The results of significant  diagnostics from this hospitalization (including imaging, microbiology, ancillary and laboratory) are listed below for reference.   Imaging Studies: ECHOCARDIOGRAM COMPLETE  Result Date: 01/17/2023    ECHOCARDIOGRAM REPORT   Patient Name:   BETHSY SCARANTINO Date of Exam: 01/17/2023 Medical Rec #:  [id number]         Height:       69.0 in Accession #:    [id number]        Weight:       204.0 lb Date of Birth:  Sep 10, 1965          BSA:          2.083 m Patient Age:    56 years          BP:           138/86 mmHg Patient Gender: F                 HR:           78 bpm. Exam Location:  [facility] Procedure: 2D Echo, Cardiac Doppler and Color Doppler Indications:     CHF-acute systolic I50.21  History:         Patient has prior history of Echocardiogram examinations, most                  recent 01/22/2022. Stroke; Risk Factors:Dyslipidemia and                  Hypertension.  Sonographer:     Cristela Blue Referring Phys:  [id number] [facility] GOEL Diagnosing Phys: Debbe Odea MD IMPRESSIONS  1. Left ventricular ejection fraction, by estimation, is 35 to 40%. The left ventricle has moderately decreased function. The left ventricle has no regional wall motion abnormalities. There is mild left ventricular hypertrophy. Left ventricular diastolic parameters are consistent with Grade II diastolic dysfunction (pseudonormalization).  2. Right ventricular systolic function was not well visualized. The right ventricular size is not well visualized.  3. The mitral valve is degenerative. Moderate mitral valve regurgitation. Moderate mitral annular calcification.  4. The aortic valve is calcified. Aortic valve regurgitation is not visualized. FINDINGS  Left Ventricle: Left ventricular ejection fraction, by estimation, is 35 to 40%. The left ventricle has moderately decreased function. The left ventricle has no regional wall motion abnormalities. The left ventricular internal cavity size was normal in size. There is mild left ventricular  hypertrophy. Left ventricular diastolic parameters are consistent with Grade II diastolic dysfunction (pseudonormalization). Right Ventricle: The right ventricular size is not well visualized. No increase  in right ventricular wall thickness. Right ventricular systolic function was not well visualized. Left Atrium: Left atrial size was normal in size. Right Atrium: Right atrial size was normal in size. Pericardium: There is no evidence of pericardial effusion. Mitral Valve: The mitral valve is degenerative in appearance. Moderate mitral annular calcification. Moderate mitral valve regurgitation. MV peak gradient, 8.8 mmHg. The mean mitral valve gradient is 3.0 mmHg. Tricuspid Valve: The tricuspid valve is not well visualized. Tricuspid valve regurgitation is not demonstrated. Aortic Valve: The aortic valve is calcified. Aortic valve regurgitation is not visualized. Aortic valve mean gradient measures 10.0 mmHg. Aortic valve peak gradient measures 17.0 mmHg. Aortic valve area, by VTI measures 0.96 cm. Pulmonic Valve: The pulmonic valve was not well visualized. Pulmonic valve regurgitation is not visualized. Aorta: The aortic root is normal in size and structure. Venous: The inferior vena cava was not well visualized. IAS/Shunts: No atrial level shunt detected by color flow Doppler.  LEFT VENTRICLE PLAX 2D LVIDd:         5.60 cm      Diastology LVIDs:         4.80 cm      LV e' medial:    5.00 cm/s LV PW:         1.00 cm      LV E/e' medial:  31.6 LV IVS:        1.30 cm      LV e' lateral:   8.05 cm/s LVOT diam:     2.00 cm      LV E/e' lateral: 19.6 LV SV:         39 LV SV Index:   19 LVOT Area:     3.14 cm  LV Volumes (MOD) LV vol d, MOD A4C: 115.0 ml LV vol s, MOD A4C: 114.0 ml LV SV MOD A4C:     115.0 ml RIGHT VENTRICLE RV S prime:     9.68 cm/s TAPSE (M-mode): 1.8 cm LEFT ATRIUM             Index        RIGHT ATRIUM           Index LA diam:        4.60 cm 2.21 cm/m   RA Area:     19.60 cm LA Vol (A2C):   51.1  ml 24.53 ml/m  RA Volume:   55.50 ml  26.64 ml/m LA Vol (A4C):   72.1 ml 34.61 ml/m LA Biplane Vol: 65.2 ml 31.29 ml/m  AORTIC VALVE AV Area (Vmax):    0.97 cm AV Area (Vmean):   0.82 cm AV Area (VTI):     0.96 cm AV Vmax:           206.33 cm/s AV Vmean:          149.333 cm/s AV VTI:            0.410 m AV Peak Grad:      17.0 mmHg AV Mean Grad:      10.0 mmHg LVOT Vmax:         63.90 cm/s LVOT Vmean:        38.800 cm/s LVOT VTI:          0.125 m LVOT/AV VTI ratio: 0.30  AORTA Ao Root diam: 2.70 cm MITRAL VALVE MV Area (PHT): 5.75 cm     SHUNTS MV Area VTI:   1.03 cm     Systemic VTI:  0.12 m MV Peak grad:  8.8 mmHg     Systemic Diam: 2.00 cm MV Mean grad:  3.0 mmHg MV Vmax:       1.48 m/s MV Vmean:      79.3 cm/s MV Decel Time: 132 msec MV E velocity: 158.00 cm/s MV A velocity: 92.20 cm/s MV E/A ratio:  1.71 Debbe Odea MD Electronically signed by Debbe Odea MD Signature Date/Time: 01/17/2023/12:49:10 PM    Final    CT CHEST WO CONTRAST  Result Date: 01/16/2023 CLINICAL DATA:  Pleural effusion, known or suspected. Weakness beginning on Sunday. EXAM: CT CHEST WITHOUT CONTRAST TECHNIQUE: Multidetector CT imaging of the chest was performed following the standard protocol without IV contrast. RADIATION DOSE REDUCTION: This exam was performed according to the departmental dose-optimization program which includes automated exposure control, adjustment of the mA and/or kV according to patient size and/or use of iterative reconstruction technique. COMPARISON:  Chest radiograph 01/16/2023.  CT 05/30/2022 FINDINGS: Cardiovascular: Cardiac enlargement. No pericardial effusions. Calcification of the coronary arteries and aorta. No aortic aneurysm. Mediastinum/Nodes: Thyroid gland is unremarkable. Esophagus is decompressed. Mediastinal lymph nodes are prominent, with right paratracheal nodes measuring up to about 12 mm short axis dimension. Lymph nodes are similar to prior study, most likely reactive.  Lungs/Pleura: Small right pleural effusion is decreased since previous study. Atelectasis or consolidation in the right lung base. Scattered emphysematous changes in the lungs. No pneumothorax. Upper Abdomen: No acute abnormalities demonstrated. Severe and diffuse vascular calcifications throughout the upper abdomen. Musculoskeletal: Degenerative changes in the spine. IMPRESSION: 1. Small right pleural effusion with consolidation or atelectasis in the right lung base. Mild improvement since prior CT. For 2 diffuse cardiac enlargement. 2. Aortic atherosclerosis with severe vascular calcification throughout the chest and abdomen. 3. Stable appearance of mildly enlarged mediastinal lymph nodes, likely reactive. 4. Emphysematous changes in the lungs. Electronically Signed   By: Burman Nieves M.D.   On: 01/16/2023 23:46   DG Chest Port 1 View  Result Date: 01/16/2023 CLINICAL DATA:  Weakness and Saturday EXAM: PORTABLE CHEST 1 VIEW COMPARISON:  Radiographs 01/11/2023 FINDINGS: Stable cardiomegaly. Airspace and interstitial opacities in the right mid and lower lung. Moderate right pleural effusion. The left lung is clear. No pneumothorax. No displaced rib fractures. IMPRESSION: Airspace and interstitial opacities in the right mid and lower lung with moderate right pleural effusion similar to prior. Cardiomegaly. Electronically Signed   By: Minerva Fester M.D.   On: 01/16/2023 19:20   DG Abd Acute W/Chest  Result Date: 01/11/2023 CLINICAL DATA:  Constipated EXAM: DG ABDOMEN ACUTE WITH 1 VIEW CHEST COMPARISON:  01/01/2023 FINDINGS: Cardiomegaly with vascular congestion. Right pleural effusion with right lower lobe atelectasis or scarring, stable. No confluent opacity on the left. Moderate stool burden in the colon. There is normal bowel gas pattern. No free air. No organomegaly or suspicious calcification. No acute bony abnormality. IMPRESSION: Chronic right pleural effusion with right base atelectasis or  scarring, stable. Cardiomegaly, vascular congestion. Moderate stool burden in the colon. No bowel obstruction or free air. Electronically Signed   By: Charlett Nose M.D.   On: 01/11/2023 02:11   US Abdomen Limited RUQ (LIVER/GB)  Result Date: 01/02/2023 CLINICAL DATA:  Transaminitis.  History of cholecystectomy. EXAM: ULTRASOUND ABDOMEN LIMITED RIGHT UPPER QUADRANT COMPARISON:  CT and MRCP 05/30/2022 FINDINGS: Gallbladder: Surgically absent. Common bile duct: Diameter: 8 mm.  No intrahepatic biliary dilation. Liver: No focal lesion identified. Question subtle nodularity of the hepatic contour. Within normal limits in parenchymal echogenicity. Portal vein is patent on  color Doppler imaging with normal direction of blood flow towards the liver. Other: Incidental note of right pleural effusion. Trace perihepatic fluid. IMPRESSION: 1. Question subtle nodularity of the hepatic contour which can be seen with cirrhosis. 2. Trace perihepatic fluid. 3. Incidental note of right pleural effusion. Electronically Signed   By: Minerva Fester M.D.   On: 01/02/2023 01:13   DG Chest 2 View  Result Date: 01/01/2023 CLINICAL DATA:  Shortness of breath. EXAM: CHEST - 2 VIEW COMPARISON:  Dec 27, 2022. FINDINGS: Stable cardiomediastinal silhouette. Stable small right pleural effusion is noted with associated right basilar atelectasis. Left lung is unremarkable. Bony thorax is unremarkable. IMPRESSION: Stable small right pleural effusion with associated right basilar atelectasis or scarring. Electronically Signed   By: Lupita Raider M.D.   On: 01/01/2023 13:48   DG Chest 2 View  Result Date: 12/28/2022 CLINICAL DATA:  57 year old female with constipation. Dialysis patient. Shortness of breath on exertion. EXAM: CHEST - 2 VIEW COMPARISON:  Portable chest 05/31/2022. CT Chest, Abdomen, and Pelvis 05/30/2022. FINDINGS: PA and lateral views at 1215 hours on 12/27/2022. Stable lung volumes since last year. Stable mild  cardiomegaly. Other mediastinal contours are within normal limits. Chronic right lung base fibrothorax, chronic pleural effusion and pleural thickening as demonstrated on prior CT. Adjacent atelectasis, right lung base ventilation not significantly changed from October. No superimposed pneumothorax. Pulmonary vascularity however is increased in both lungs. No left pleural effusion. No other confluent opacity. Visualized tracheal air column is within normal limits. No acute osseous abnormality identified. Negative visible bowel gas. IMPRESSION: 1. Acute interstitial opacity. Consider acute interstitial edema in this setting, viral/atypical respiratory infection also possible. 2. Underlying chronic right lung base fibrothorax / chronic pleural effusion. Stable ventilation there since October. 3. Mild cardiomegaly. Electronically Signed   By: Odessa Fleming M.D.   On: 12/28/2022 08:02   DG Abdomen 1 View  Result Date: 12/28/2022 CLINICAL DATA:  57 year old female with constipation. Dialysis patient. EXAM: ABDOMEN - 1 VIEW COMPARISON:  CT Abdomen and Pelvis 05/30/2022. FINDINGS: 2 spine views of the abdomen at 0740 hours. Diffuse calcified atherosclerosis in the abdomen and pelvis. Stable cholecystectomy clips. Non obstructed bowel gas pattern. Average volume of retained stool. No acute osseous abnormality identified. IMPRESSION: 1. Nonobstructed bowel-gas pattern with an average volume of retained stool. 2. Diffuse visceral calcified atherosclerosis. Electronically Signed   By: Odessa Fleming M.D.   On: 12/28/2022 07:59    Microbiology: Results for orders placed or performed during the hospital encounter of 01/16/23  Blood Culture (routine x 2)     Status: None (Preliminary result)   Collection Time: 01/16/23  6:41 PM   Specimen: BLOOD  Result Value Ref Range Status   Specimen Description BLOOD BLOOD RIGHT ARM  Final   Special Requests   Final    BOTTLES DRAWN AEROBIC AND ANAEROBIC Blood Culture adequate volume    Culture   Final    NO GROWTH 4 DAYS Performed at [facility], [address], [place], [place] [address]    Report Status PENDING  Incomplete  Blood Culture (routine x 2)     Status: None (Preliminary result)   Collection Time: 01/16/23 10:39 PM   Specimen: BLOOD  Result Value Ref Range Status   Specimen Description BLOOD BLOOD RIGHT FOREARM  Final   Special Requests   Final    BOTTLES DRAWN AEROBIC AND ANAEROBIC Blood Culture adequate volume   Culture   Final    NO GROWTH  4 DAYS Performed at [facility], [address] [place], [place] [address]    Report Status PENDING  Incomplete  Respiratory (~20 pathogens) panel by PCR     Status: Abnormal   Collection Time: 01/16/23 11:28 PM   Specimen: Nasopharyngeal Swab; Respiratory  Result Value Ref Range Status   Adenovirus NOT DETECTED NOT DETECTED Final   Coronavirus 229E NOT DETECTED NOT DETECTED Final    Comment: (NOTE) The Coronavirus on the Respiratory Panel, DOES NOT test for the novel  Coronavirus (2019 nCoV)    Coronavirus HKU1 NOT DETECTED NOT DETECTED Final   Coronavirus NL63 NOT DETECTED NOT DETECTED Final   Coronavirus OC43 NOT DETECTED NOT DETECTED Final   Metapneumovirus NOT DETECTED NOT DETECTED Final   Rhinovirus / Enterovirus DETECTED (A) NOT DETECTED Final   Influenza A NOT DETECTED NOT DETECTED Final   Influenza B NOT DETECTED NOT DETECTED Final   Parainfluenza Virus 1 NOT DETECTED NOT DETECTED Final   Parainfluenza Virus 2 NOT DETECTED NOT DETECTED Final   Parainfluenza Virus 3 NOT DETECTED NOT DETECTED Final   Parainfluenza Virus 4 NOT DETECTED NOT DETECTED Final   Respiratory Syncytial Virus NOT DETECTED NOT DETECTED Final   Bordetella pertussis NOT DETECTED NOT DETECTED Final   Bordetella Parapertussis NOT DETECTED NOT DETECTED Final   Chlamydophila pneumoniae NOT DETECTED NOT DETECTED Final   Mycoplasma pneumoniae NOT DETECTED NOT DETECTED Final    Comment: Performed at [facility], [address], [place], [place] [address]  SARS Coronavirus 2 by RT PCR (hospital order, performed in [facility] hospital lab) *cepheid single result test* Anterior Nasal Swab     Status: None   Collection Time: 01/17/23 12:43 AM   Specimen: Anterior Nasal Swab  Result Value Ref Range Status   SARS Coronavirus 2 by RT PCR NEGATIVE NEGATIVE Final    Comment: (NOTE) SARS-CoV-2 target nucleic acids are NOT DETECTED.  The SARS-CoV-2 RNA is generally detectable in upper and lower respiratory specimens during the acute phase of infection. The lowest concentration of SARS-CoV-2 viral copies this assay can detect is 250 copies / mL. A negative result does not preclude SARS-CoV-2 infection and should not be used as the sole basis for treatment or other patient management decisions.  A negative result may occur with improper specimen collection / handling, submission of specimen other than nasopharyngeal swab, presence of viral mutation(s) within the areas targeted by this assay, and inadequate number of viral copies (<250 copies / mL). A negative result must be combined with clinical observations, patient history, and epidemiological information.  Fact Sheet for Patients:   RoadLapTop.co.za  Fact Sheet for Healthcare Providers: http://kim-miller.com/  This test is not yet approved or  cleared by the [place] FDA and has been authorized for detection and/or diagnosis of SARS-CoV-2 by FDA under an Emergency Use Authorization (EUA).  This EUA will remain in effect (meaning this test can be used) for the duration of the COVID-19 declaration under Section 564(b)(1) of the Act, 21 U.S.C. section 360bbb-3(b)(1), unless the authorization is terminated or revoked sooner.  Performed at [facility], [address] [place], [place] [address]     Labs: CBC: Recent Labs  Lab 01/18/23 0840 01/18/23 1305 01/19/23 0639  01/20/23 0626 01/20/23 0900  WBC 6.0 7.6 6.0 5.5 7.2  HGB 9.9* 10.5* 10.5* 10.6* 10.2*  HCT 31.3* 33.7* 34.0* 34.6* 32.8*  MCV 96.6 97.7 97.7 98.0 96.5  PLT 68* 58* 68* 84* 78*  Basic Metabolic Panel: Recent Labs  Lab 01/17/23 0335 01/18/23 0840 01/18/23 1305 01/19/23 0639 01/20/23 0626 01/20/23 0900  NA 135 136 138 138 135 131*  K 4.0 4.4 4.1 4.4 4.6 4.6  CL 98 97* 95* 95* 94* 93*  CO2 28 20* 26 27 21* 18*  GLUCOSE 135* 146* 116* 141* 224* 251*  BUN 21* 36* 16 29* 48* 52*  CREATININE 4.98* 6.49* 3.29* 4.81* 6.00* 6.15*  CALCIUM 8.5* 8.8* 8.8* 9.1 9.3 9.2  MG  --   --   --  2.3 2.5*  --   PHOS 3.4 4.8* 2.8  --   --  6.4*   Liver Function Tests: Recent Labs  Lab 01/17/23 0335 01/18/23 0840 01/18/23 1305 01/20/23 0900  AST 85*  --   --   --   ALT 154*  --   --   --   ALKPHOS 214*  --   --   --   BILITOT 3.3*  --   --   --   PROT 6.8  --   --   --   ALBUMIN 3.6 3.5 3.5 3.6   CBG: Recent Labs  Lab 01/17/23 1220  GLUCAP 149*    Discharge time spent: greater than 30 minutes.  This record has been created using Conservation officer, historic buildings. Errors have been sought and corrected,but may not always be located. Such creation errors do not reflect on the standard of care.   Signed: Arnetha Courser, MD [facility] 01/20/2023

## 2023-01-21 ENCOUNTER — Ambulatory Visit
Admit: 2023-01-21 | Discharge: 2023-01-25 | Disposition: A | Discharge status: Skilled Nursing Facility | Payer: MEDICARE | Admitting: Internal Medicine

## 2023-01-21 ENCOUNTER — Ambulatory Visit: Admit: 2023-01-21 | Discharge: 2023-01-25 | Payer: MEDICARE

## 2023-01-21 ENCOUNTER — Encounter: Admit: 2023-01-21 | Discharge: 2023-01-21 | Payer: MEDICARE

## 2023-01-21 DIAGNOSIS — R69 Illness, unspecified: Principal | ICD-10-CM

## 2023-01-21 LAB — CULTURE, BLOOD (ROUTINE X 2)
Culture: NO GROWTH
Special Requests: ADEQUATE

## 2023-01-22 ENCOUNTER — Encounter: Payer: Medicare Other | Admitting: Physical Therapy

## 2023-01-22 MED ORDER — JARDIANCE 10 MG TABLET
ORAL_TABLET | 0 refills | 0 days
Start: 2023-01-22 — End: 2023-01-22

## 2023-01-22 MED ORDER — BRILINTA 90 MG TABLET
ORAL_TABLET | Freq: Two times a day (BID) | ORAL | 0 refills | 30 days
Start: 2023-01-22 — End: 2023-01-22

## 2023-01-22 MED ORDER — ENTRESTO 24 MG-26 MG TABLET
ORAL_TABLET | 0 refills | 0 days
Start: 2023-01-22 — End: 2023-01-22

## 2023-01-22 MED ORDER — EVOLOCUMAB 140 MG/ML SUBCUTANEOUS PEN INJECTOR
SUBCUTANEOUS | 0 refills | 30 days | Status: CP
Start: 2023-01-22 — End: 2023-02-21

## 2023-01-22 NOTE — Consult Note (Signed)
[facility] Customer service manager [facility]) Accountable Care Organization (ACO) [facility] Liaison Note  01/22/2023  [name] 1966/01/27 [id number]  Location: [facility] RN Hospital Liaison screened the patient remotely at [facility].  Insurance: MCR ACO   Cindy Osborne is a 57 y.o. female who is a Primary Care Patient of Reubin Milan, MD. The patient was screened for 30 day readmission hospitalization with noted high risk score for unplanned readmission risk with 2 IP/ 3 ED in 6 months.  The patient was assessed for potential [facility]) Care Management service needs for post hospital transition for care coordination. Review of patient's electronic medical record reveals patient was admitted with Pneumonia. Spoke with pt today who present medical condition for which she visited another facility [facility] for an second opinion on a recent diagnosis. Educated pt on [facility] services and pt was receptive to a post hospital follow up call for readmission prevention measures. Will make a referral to [facility] RN care coordinator for ongoing care management services.   [facility] Care Management/Population Health does not replace or interfere with any arrangements made by the Inpatient Transition of Care team.   For questions contact:   Elliot Cousin, RN, BSN [facility] Liaison [place]   [facility]  Population Health Office Hours MTWF  8:00 am-6:00 pm Off on Thursday [phone] mobile [phone] [Office toll free line][facility] Office Hours are M-F 8:30 - 5 pm 24 hour nurse advise line [phone] Concierge  [email or web address]

## 2023-01-23 MED ORDER — ENTRESTO 24 MG-26 MG TABLET
ORAL_TABLET | Freq: Two times a day (BID) | ORAL | 0 refills | 0 days
Start: 2023-01-23 — End: 2023-01-23

## 2023-01-24 ENCOUNTER — Encounter: Payer: Medicare Other | Admitting: Physical Therapy

## 2023-01-24 ENCOUNTER — Telehealth: Payer: Self-pay

## 2023-01-24 DIAGNOSIS — I10 Essential (primary) hypertension: Secondary | ICD-10-CM

## 2023-01-24 DIAGNOSIS — I130 Hypertensive heart and chronic kidney disease with heart failure and stage 1 through stage 4 chronic kidney disease, or unspecified chronic kidney disease: Secondary | ICD-10-CM

## 2023-01-24 MED ORDER — EVOLOCUMAB 140 MG/ML SUBCUTANEOUS PEN INJECTOR
SUBCUTANEOUS | 0 refills | 30.00000 days | Status: CP
Start: 2023-01-24 — End: 2023-02-23
  Filled 2023-02-09: qty 2, 28d supply, fill #0

## 2023-01-25 ENCOUNTER — Telehealth: Payer: Self-pay | Admitting: *Deleted

## 2023-01-25 DIAGNOSIS — I509 Heart failure, unspecified: Principal | ICD-10-CM

## 2023-01-25 NOTE — Progress Notes (Signed)
  Care Coordination   Note   01/25/2023 Name: Akilah Cureton MRN: [id number] DOB: [birth year]  Magenta Schmiesing is a 57 y.o. year old female who sees Reubin Milan, MD for primary care. I reached out to [name] by phone today to offer care coordination services.  Ms. Luviano was given information about Care Coordination services today including:   The Care Coordination services include support from the care team which includes your Nurse Coordinator, Clinical Social Worker, or Pharmacist.  The Care Coordination team is here to help remove barriers to the health concerns and goals most important to you. Care Coordination services are voluntary, and the patient may decline or stop services at any time by request to their care team member.   Care Coordination Consent Status: Patient agreed to services and verbal consent obtained.   Follow up plan:  Telephone appointment with care coordination team member scheduled for:  02/02/2023  Encounter Outcome:  Pt. Scheduled from referral   Burman Nieves, [facility] Care Coordination Care Guide Direct Dial: [phone]

## 2023-01-29 ENCOUNTER — Encounter: Payer: Medicare Other | Admitting: Physical Therapy

## 2023-01-31 ENCOUNTER — Encounter: Payer: Medicare Other | Admitting: Physical Therapy

## 2023-01-31 ENCOUNTER — Encounter: Payer: Self-pay | Admitting: Cardiology

## 2023-01-31 ENCOUNTER — Ambulatory Visit: Payer: Medicare Other | Attending: Cardiology | Admitting: Cardiology

## 2023-01-31 VITALS — BP 112/64 | HR 74 | Ht 69.0 in | Wt 203.5 lb

## 2023-01-31 DIAGNOSIS — Z79899 Other long term (current) drug therapy: Secondary | ICD-10-CM | POA: Diagnosis not present

## 2023-01-31 DIAGNOSIS — I2510 Atherosclerotic heart disease of native coronary artery without angina pectoris: Secondary | ICD-10-CM | POA: Diagnosis not present

## 2023-01-31 DIAGNOSIS — I4819 Other persistent atrial fibrillation: Secondary | ICD-10-CM | POA: Diagnosis not present

## 2023-01-31 DIAGNOSIS — I5022 Chronic systolic (congestive) heart failure: Secondary | ICD-10-CM | POA: Diagnosis not present

## 2023-01-31 NOTE — Progress Notes (Signed)
  Electrophysiology Office Note:    Date:  01/31/2023   ID:  Cindy Osborne, DOB [birth year], MRN [id number]  [facility] Cardiologist:  Debbe Odea, MD  [facility] Electrophysiologist:  Lanier Prude, MD   Referring MD: Debbe Odea, MD   Chief Complaint: Atrial fibrillation  History of Present Illness:    Cindy Osborne is a 57 y.o. female who I am seeing today for an evaluation of atrial fibrillation at the request of Dr. Azucena Cecil.  The patient was recently hospitalized at [facility] June 9 through January 25, 2023.  She was admitted with a principal diagnosis of acutely decompensated systolic heart failure.  Her medical history also includes CVA, hyperlipidemia, atrial fibrillation, ESRD on intermittent dialysis.  Heart cath was performed and demonstrated three-vessel coronary artery disease.  She is on Eliquis for stroke prophylaxis at 2.5 mg by mouth twice daily.  She has a history of multiple strokes, 1 treated with tenecteplase and mechanical thrombectomy.  She is doing okay since leaving [facility].  No further chest pain.  No syncope or presyncope.  She continues to take Eliquis 2.5 mg by mouth twice daily but she continues to have trouble with bleeding after dialysis access is removed.  She is having her vascular access assessed soon.      Their past medical, social and family history was reveiwed.   ROS:   Please see the history of present illness.    All other systems reviewed and are negative.  EKGs/Labs/Other Studies Reviewed:    The following studies were reviewed today:  January 17, 2023 echo EF 35-40 percent RV not well-visualized Moderate MR  January 17, 2023 EKG shows sinus rhythm with frequent monomorphic PVCs    Physical Exam:    VS:  BP 112/64   Pulse 74   Ht 5\' 9"  (1.753 m)   Wt 203 lb 8 oz (92.3 kg)   SpO2 99%   BMI 30.05 kg/m     Wt Readings from Last 3 Encounters:  01/31/23 203 lb 8 oz (92.3 kg)  01/20/23 197 lb 8.5 oz  (89.6 kg)  01/15/23 204 lb (92.5 kg)     GEN:  Well nourished, well developed in no acute distress CARDIAC: RRR, no murmurs, rubs, gallops RESPIRATORY:  Clear to auscultation without rales, wheezing or rhonchi       ASSESSMENT AND PLAN:    1. Persistent atrial fibrillation (HCC)   2. Chronic systolic heart failure (HCC)   3. Coronary artery disease involving native coronary artery of native heart without angina pectoris   4. Encounter for long-term (current) use of high-risk medication     #Persistent atrial fibrillation In regular rhythm today. On Eliquis for stroke prophylaxis.  2 prior CVAs, 1 requiring thrombolytics and mechanical thrombectomy.  She is not a candidate for invasive EP procedures.  #Chronic systolic heart failure Relatively recent diagnosis.  Saw [facility] for second opinion recently.  #Coronary artery disease No ischemic symptoms today Recent PCI to the right coronary artery at [facility].  Follow-up 6 months with Rosalita Chessman.  Recommend ZIO monitor at that appointment to reassess burden of atrial fibrillation and PVCs after she has had time to recover from her PCI.  Follow-up with general cardiology as planned    Signed, Rossie Muskrat. Lalla Brothers, MD, [facility], [facility] 01/31/2023 11:59 AM    Electrophysiology [facility]

## 2023-01-31 NOTE — Patient Instructions (Signed)
Medication Instructions:  Your physician recommends that you continue on your current medications as directed. Please refer to the Current Medication list given to you today.  *If you need a refill on your cardiac medications before your next appointment, please call your pharmacy*  Follow-Up: At [facility], you and your health needs are our priority.  As part of our continuing mission to provide you with exceptional heart care, we have created designated Provider Care Teams.  These Care Teams include your primary Cardiologist (physician) and Advanced Practice Providers (APPs -  Physician Assistants and Nurse Practitioners) who all work together to provide you with the care you need, when you need it.  Your next appointment:   6 month(s)  Provider:   Suzann Riddle, NP  

## 2023-02-02 ENCOUNTER — Encounter: Payer: Self-pay | Admitting: *Deleted

## 2023-02-05 ENCOUNTER — Encounter: Payer: Medicare Other | Admitting: Physical Therapy

## 2023-02-06 ENCOUNTER — Telehealth: Payer: Self-pay | Admitting: *Deleted

## 2023-02-06 ENCOUNTER — Other Ambulatory Visit: Payer: Self-pay | Admitting: Internal Medicine

## 2023-02-06 DIAGNOSIS — F5101 Primary insomnia: Secondary | ICD-10-CM

## 2023-02-06 NOTE — Unmapped (Signed)
[facility] Specialty Medication Onboarding    Specialty Medication: Repatha  Prior Authorization: Approved   Financial Assistance: Yes - grant approved as secondary   Final Copay/Day Supply: $0 / 28    Insurance Restrictions: None     Notes to Pharmacist: None  Credit Card on File: not applicable    The triage team has completed the benefits investigation and has determined that the patient is able to fill this medication at [facility]. Please contact the patient to complete the onboarding or follow up with the prescribing physician as needed.

## 2023-02-06 NOTE — Progress Notes (Signed)
  Care Coordination Note  02/06/2023 Name: Cindy Osborne MRN: [id number] DOB: 1966/08/01  Cindy Osborne is a 57 y.o. year old female who is a primary care patient of Reubin Milan, MD and is actively engaged with the care management team. I reached out to [name] by phone today to assist with re-scheduling an initial visit with the RN Case Manager  Follow up plan: Pt has since went to rehab (following stroke) and unclear when she will be d/c home - would appreciate a call back next month to f/u again    Burman Nieves, [facility] Care Coordination Care Guide Direct Dial: [phone]

## 2023-02-07 ENCOUNTER — Encounter: Payer: Medicare Other | Admitting: Physical Therapy

## 2023-02-07 MED ORDER — EMPTY CONTAINER
2 refills | 0 days
Start: 2023-02-07 — End: ?

## 2023-02-07 NOTE — Unmapped (Signed)
[facility]   Specialty Lite Counseling    Martha Alvarez is a 57 y.o. female with acute congestive heart failure who I am counseling today on initiation of therapy.  I am speaking to the patient.    Was a Nurse, learning disability used for this call? No    Verified patient's date of birth / HIPAA.    Specialty Lite medication(s) to be sent: General Specialty: Repatha      Non-specialty medications/supplies to be sent: Higher education careers adviser      Medications not needed at this time: n/a         An offer to provide counseling to the patient regarding their medication was made. The patient declined counseling      Current Medications (including OTC/herbals), Comorbidities and Allergies     Current Outpatient Medications   Medication Sig Dispense Refill    ALPRAZolam (XANAX) 0.25 MG tablet Take 1 tablet (0.25 mg total) by mouth nightly as needed for sleep.      apixaban (ELIQUIS) 2.5 mg Tab Take 1 tablet (2.5 mg total) by mouth two (2) times a day.      benzonatate (TESSALON) 200 MG capsule Take 1 capsule (200 mg total) by mouth Three (3) times a day as needed for cough.      cholecalciferol, vitamin D3, (VITAMIN D3) 1,000 unit capsule Take 1,000 Units by mouth daily.      clopidogrel (PLAVIX) 75 mg tablet Take 1 tablet (75 mg total) by mouth daily.      evolocumab 140 mg/mL PnIj Inject the contents of 1 pen (140 mg) under the skin every fourteen (14) days. 2 mL 0    ezetimibe (ZETIA) 10 mg tablet Take 1 tablet (10 mg total) by mouth nightly.      folic acid/vit B complex and C (RENA-VITE ORAL) Take 1 tablet by mouth at bedtime.      guaiFENesin (MUCINEX) 600 mg 12 hr tablet Take 1 tablet (600 mg total) by mouth every twelve (12) hours.      hydrOXYzine (ATARAX) 10 MG tablet Take 1 tablet (10 mg total) by mouth Three (3) times a day as needed for itching or anxiety.      insulin lispro (HUMALOG) 100 unit/mL injection Inject 0-0.2 mL (0-20 Units total) under the skin Three (3) times a day before meals. Correction insulin scale based on Insulin sensitivity factor 30:    BG 155-184 = 1 units  BG 185-214 = 2 units  BG 215-244 = 3 units  BG 245-274 = 4 units  BG 275-300 = 5 units  BG > 300 = 6 units and notify provider      melatonin 3 mg Tab Take 1 tablet (3 mg total) by mouth nightly as needed.      metoPROLOL succinate (TOPROL-XL) 50 MG 24 hr tablet Take 1 tablet (50 mg total) by mouth daily.      ondansetron (ZOFRAN) 4 mg/2 mL injection Infuse 2 mL (4 mg total) into a venous catheter every eight (8) hours as needed.      polyethylene glycol (MIRALAX) 17 gram packet Take 17 g by mouth daily.      pravastatin (PRAVACHOL) 20 MG tablet Take 1 tablet (20 mg total) by mouth daily.      sacubitril-valsartan (ENTRESTO) 24-26 mg tablet Take 1 tablet by mouth two (2) times a day.      senna (SENOKOT) 8.6 mg tablet Take 1 tablet by mouth nightly.      sucroferric oxyhydroxide (  VELPHORO) 500 mg Chew Chew 1,000 mg Three (3) times a day before meals.      torsemide 40 mg Tab Take 2 tablets (80 mg total) by mouth daily. Take only on non-dialysis days: Sunday, Monday, Wednesday, and Friday      traZODone (DESYREL) 50 MG tablet Take 1 tablet (50 mg total) by mouth nightly.       No current facility-administered medications for this visit.       Allergies   Allergen Reactions    Shellfish Containing Products Rash     shrimp    Sulfa (Sulfonamide Antibiotics) Rash       Patient Active Problem List   Diagnosis    CKD (chronic kidney disease)    HTN (hypertension)    Diabetes (CMS-HCC)    Hyperlipidemia    Acute congestive heart failure (CMS-HCC)    A-fib (CMS-HCC)    Elevated LFTs    Generalized weakness    ESRD (end stage renal disease) on dialysis (CMS-HCC)       Reviewed and up to date in Epic.    Appropriateness of Therapy     Prescription has been clinically reviewed: Yes    Financial Information     Medication Assistance provided: Prior Authorization and [employer]    Anticipated copay of $0 reviewed with patient.     Patient Specific Needs     Does the patient have any physical, cognitive, or cultural barriers? No    Does the patient have adequate living arrangements? (i.e. the ability to store and take their medication appropriately) Yes    Did you identify any home environmental safety or security hazards? No    Patient prefers to have medications discussed with  Patient     Is the patient or caregiver able to read and understand education materials at a high school level or above? Yes    Patient's primary language is  English     Is the patient high risk? No    SOCIAL DETERMINANTS OF HEALTH     At the [facility], we have learned that life circumstances - like trouble affording food, housing, utilities, or transportation can affect the health of many of our patients.   That is why we wanted to ask: are you currently experiencing any life circumstances that are negatively impacting your health and/or quality of life? Patient declined to answer    Social Determinants of Health     Financial Resource Strain: Low Risk  (11/08/2022)    Received from [facility], [facility]    Overall Financial Resource Strain (CARDIA)     Difficulty of Paying Living Expenses: Not hard at all   Internet Connectivity: Not on file   Food Insecurity: No Food Insecurity (01/17/2023)    Received from [facility], [facility]    Hunger Vital Sign     Worried About Running Out of Food in the Last Year: Never true     Ran Out of Food in the Last Year: Never true   Tobacco Use: Unknown (01/22/2023)    Patient History     Smoking Tobacco Use: Never     Smokeless Tobacco Use: Unknown     Passive Exposure: Not on file   Housing/Utilities: Not on file   Alcohol Use: Unknown (04/09/2018)    [facility]    AUDIT-C     Frequency of Alcohol Consumption: 2-4 times a month     Average Number of Drinks: Not  on file     Frequency of Binge Drinking: Not on file   Transportation Needs: No Transportation Needs (01/17/2023)    Received from [facility], [facility]    [facility] - Transportation     Lack of Transportation (Medical): No     Lack of Transportation (Non-Medical): No   Substance Use: Not on file   Health Literacy: Not on file   Physical Activity: Inactive (11/08/2022)    Received from [facility], [facility]    Exercise Vital Sign     Days of Exercise per Week: 0 days     Minutes of Exercise per Session: 0 min   Interpersonal Safety: Not on file   Stress: No Stress Concern Present (11/08/2022)    Received from [facility], [facility]    [facility] of Occupational Health - Occupational Stress Questionnaire     Feeling of Stress : Only a little   Intimate Partner Violence: Not At Risk (01/17/2023)    Received from [facility], [facility]    Humiliation, Afraid, Rape, and Kick questionnaire     Fear of Current or Ex-Partner: No     Emotionally Abused: No     Physically Abused: No     Sexually Abused: No   Depression: Not at risk (12/27/2018)    Received from [facility], [facility]    PHQ-2     (OBSOLETE) Total Score =: 0   Social Connections: Unknown (11/08/2022)    Received from [facility], [facility]    Social Connection and Isolation Panel [NHANES]     Frequency of Communication with Friends and Family: Patient declined     Frequency of Social Gatherings with Friends and Family: Patient declined     Attends Religious Services: Patient declined     Database administrator or Organizations: Not on file     Attends Banker Meetings: Patient declined     Marital Status: Patient declined       Would you be willing to receive help with any of the needs that you have identified today? Not applicable    Delivery Information     Verified delivery address.    Scheduled delivery date: 02/09/23    Expected start date: 02/09/23    Medication will be delivered via Same Day Courier to the prescription address in [facility].  This shipment will not require a signature.      Explained the services we provide at [facility] and that each month we will send text messages and/or mychart messages to set up refills. Informed patient that refills should be scheduled 7-10 days prior to when they will run out of medication. Informed patient that a welcome packet, containing information about our pharmacy and other support services, a Notice of Privacy Practices, and a drug information handout will be sent.      The patient or caregiver noted above participated in the development of this care plan and knows that they can request review of or adjustments to the care plan at any time.      Patient or caregiver verbalized understanding of the above information as well as how to contact the pharmacy at [phone] option 4 with any questions/concerns.  The pharmacy is open Monday through Friday 8:30am-4:30pm.  A pharmacist is available 24/7 via pager to answer any clinical questions they may have.      Camillo Flaming, PharmD  [facility]  Pharmacy Specialty Pharmacist

## 2023-02-09 MED FILL — EMPTY CONTAINER: 120 days supply | Qty: 1 | Fill #0

## 2023-03-07 DIAGNOSIS — I509 Heart failure, unspecified: Principal | ICD-10-CM

## 2023-03-07 MED ORDER — REPATHA SURECLICK 140 MG/ML SUBCUTANEOUS PEN INJECTOR
SUBCUTANEOUS | 0 refills | 28 days
Start: 2023-03-07 — End: 2023-04-06

## 2023-03-08 MED ORDER — REPATHA SURECLICK 140 MG/ML SUBCUTANEOUS PEN INJECTOR
SUBCUTANEOUS | 0 refills | 28 days | Status: CP
Start: 2023-03-08 — End: 2023-04-07

## 2023-03-09 ENCOUNTER — Encounter: Payer: Self-pay | Admitting: Cardiology

## 2023-03-15 ENCOUNTER — Ambulatory Visit: Payer: Federal, State, Local not specified - PPO

## 2023-03-15 NOTE — Unmapped (Signed)
[facility] has made a second and final attempt to reach this patient to refill the following medication:Repatha.      We have left voicemails on the following phone numbers: [phone], have sent a MyChart message, and have sent a text message to the following phone numbers: [phone] .    Dates contacted: 7/24, 8/1  Last scheduled delivery: shipped 6/28    The patient may be at risk of non-compliance with this medication. The patient should call the [facility] at [phone]  Option 4, then Option 5: Cardiology, Endocrinology to refill medication.    Martha Alvarez   [facility] Specialty Technician

## 2023-03-15 NOTE — Progress Notes (Signed)
Pt is still in rehab facility - doesn't have an expected timeframe of when she will come home. Contact info given to call when she returns home - has appt with pcp tomorrow - closing referral

## 2023-03-15 NOTE — Progress Notes (Signed)
  Care Coordination Note  03/15/2023 Name: Cindy Osborne MRN: [id number] DOB: February 07, 1966  Cindy Osborne is a 57 y.o. year old female who is a primary care patient of Reubin Milan, MD and is actively engaged with the care management team. I reached out to [name] by phone today to assist with re-scheduling an initial visit with the RN Case Manager  Follow up plan: Unsuccessful telephone outreach attempt made. A HIPAA compliant phone message was left for the patient providing contact information and requesting a return call.   Burman Nieves, CCMA Care Coordination Care Guide Direct Dial: [phone]

## 2023-03-16 ENCOUNTER — Ambulatory Visit (INDEPENDENT_AMBULATORY_CARE_PROVIDER_SITE_OTHER): Payer: Medicare Other

## 2023-03-16 DIAGNOSIS — Z23 Encounter for immunization: Secondary | ICD-10-CM | POA: Diagnosis not present

## 2023-04-28 ENCOUNTER — Other Ambulatory Visit: Payer: Self-pay

## 2023-04-28 ENCOUNTER — Emergency Department
Admission: EM | Admit: 2023-04-28 | Discharge: 2023-04-28 | Disposition: A | Discharge status: Home / Self Care | Payer: Medicare Other | Attending: Emergency Medicine | Admitting: Emergency Medicine

## 2023-04-28 DIAGNOSIS — R000 Tachycardia, unspecified: Secondary | ICD-10-CM | POA: Insufficient documentation

## 2023-04-28 DIAGNOSIS — R42 Dizziness and giddiness: Secondary | ICD-10-CM | POA: Diagnosis present

## 2023-04-28 LAB — CBC WITH DIFFERENTIAL/PLATELET
Abs Immature Granulocytes: 0.04 10*3/uL (ref 0.00–0.07)
Basophils Absolute: 0 10*3/uL (ref 0.0–0.1)
Basophils Relative: 0 %
Eosinophils Absolute: 0.2 10*3/uL (ref 0.0–0.5)
Eosinophils Relative: 3 %
HCT: 35.4 % — ABNORMAL LOW (ref 36.0–46.0)
Hemoglobin: 11.2 g/dL — ABNORMAL LOW (ref 12.0–15.0)
Immature Granulocytes: 1 %
Lymphocytes Relative: 24 %
Lymphs Abs: 1.4 10*3/uL (ref 0.7–4.0)
MCH: 30.5 pg (ref 26.0–34.0)
MCHC: 31.6 g/dL (ref 30.0–36.0)
MCV: 96.5 fL (ref 80.0–100.0)
Monocytes Absolute: 0.4 10*3/uL (ref 0.1–1.0)
Monocytes Relative: 8 %
Neutro Abs: 3.7 10*3/uL (ref 1.7–7.7)
Neutrophils Relative %: 64 %
Platelets: 78 10*3/uL — ABNORMAL LOW (ref 150–400)
RBC: 3.67 MIL/uL — ABNORMAL LOW (ref 3.87–5.11)
RDW: 14.9 % (ref 11.5–15.5)
WBC: 5.7 10*3/uL (ref 4.0–10.5)
nRBC: 0 % (ref 0.0–0.2)

## 2023-04-28 LAB — BASIC METABOLIC PANEL
Anion gap: 17 — ABNORMAL HIGH (ref 5–15)
BUN: 15 mg/dL (ref 6–20)
CO2: 26 mmol/L (ref 22–32)
Calcium: 9.2 mg/dL (ref 8.9–10.3)
Chloride: 94 mmol/L — ABNORMAL LOW (ref 98–111)
Creatinine, Ser: 4.09 mg/dL — ABNORMAL HIGH (ref 0.44–1.00)
GFR, Estimated: 12 mL/min — ABNORMAL LOW (ref >60)
Glucose, Bld: 86 mg/dL (ref 70–99)
Potassium: 3.4 mmol/L — ABNORMAL LOW (ref 3.5–5.1)
Sodium: 137 mmol/L (ref 135–145)

## 2023-04-28 LAB — CBG MONITORING, ED: Glucose-Capillary: 61 mg/dL — ABNORMAL LOW (ref 70–99)

## 2023-04-28 LAB — TROPONIN I (HIGH SENSITIVITY): Troponin I (High Sensitivity): 43 ng/L — ABNORMAL HIGH (ref <18)

## 2023-04-28 NOTE — ED Triage Notes (Signed)
Pt to ED via [facility] with complaints of anxiety. Pt had dialysis this morning and received majority of treatment (20 mins left). Pt states she unsure of medications she takes that [place] [facility] would know. Pt denies CP, weakness, lightheadedness. Pt dialysis schedule Tuesday, Thursday, Saturday. Has hx of AFIB.  EMS vitals 92 HR  SPO2 97  102/58

## 2023-04-28 NOTE — Discharge Instructions (Signed)
Please seek medical attention for any high fevers, chest pain, shortness of breath, change in behavior, persistent vomiting, bloody stool or any other new or concerning symptoms.  

## 2023-04-28 NOTE — Progress Notes (Signed)
Removed distal dialysis needle from LUA (proximal removed at dialysis) with no complications.  Held pressure to achieve hemostasis.  Gauze bandage applied.

## 2023-04-28 NOTE — ED Notes (Signed)
Called report to [facility] for discharge

## 2023-04-28 NOTE — ED Provider Notes (Signed)
[facility] Provider Note    Event Date/Time   First MD Initiated Contact with Patient 04/28/23 1754     (approximate)   History   Tachycardia  HPI  Cindy Osborne is a 57 y.o. female who presents to the emergency department today from dialysis.  The patient was coming towards the end of her dialysis when her blood pressure started dropping.  She then felt like her heart was racing and she states they measured it in the 120s.  During this time she developed some discomfort in her shoulders.  She says this happened to her once in the past when she went into A-fib.  At the time my exam she is feeling better.  She denies any recent illness or fevers.     Physical Exam   Triage Vital Signs: ED Triage Vitals [04/28/23 1526]  Encounter Vitals Group     BP 98/69     Systolic BP Percentile      Diastolic BP Percentile      Pulse Rate (!) 102     Resp 20     Temp 98 F (36.7 C)     Temp Source Oral     SpO2 96 %     Weight 203 lb 7.8 oz (92.3 kg)     Height 5\' 9"  (1.753 m)     Head Circumference      Peak Flow      Pain Score 0     Pain Loc      Pain Education      Exclude from Growth Chart     Most recent vital signs: Vitals:   04/28/23 1526  BP: 98/69  Pulse: (!) 102  Resp: 20  Temp: 98 F (36.7 C)  SpO2: 96%   General: Awake, alert, oriented. CV:  Good peripheral perfusion. Regular rate and rhythm. Resp:  Normal effort. Lungs clear. Abd:  No distention.     ED Results / Procedures / Treatments   Labs (all labs ordered are listed, but only abnormal results are displayed) Labs Reviewed  CBC WITH DIFFERENTIAL/PLATELET - Abnormal; Notable for the following components:      Result Value   RBC 3.67 (*)    Hemoglobin 11.2 (*)    HCT 35.4 (*)    Platelets 78 (*)    All other components within normal limits  BASIC METABOLIC PANEL - Abnormal; Notable for the following components:   Potassium 3.4 (*)    Chloride 94 (*)     Creatinine, Ser 4.09 (*)    GFR, Estimated 12 (*)    Anion gap 17 (*)    All other components within normal limits  CBG MONITORING, ED - Abnormal; Notable for the following components:   Glucose-Capillary 61 (*)    All other components within normal limits  TROPONIN I (HIGH SENSITIVITY) - Abnormal; Notable for the following components:   Troponin I (High Sensitivity) 43 (*)    All other components within normal limits  TROPONIN I (HIGH SENSITIVITY)     EKG  I, Phineas Semen, attending physician, personally viewed and interpreted this EKG  EKG Time: 1528 Rate: 91 Rhythm: sinus rhythm with PVC Axis: normal Intervals: qtc 501 QRS: IVCD ST changes: no st elevation Impression: abnormal ekg   RADIOLOGY None  PROCEDURES:  Critical Care performed: No   MEDICATIONS ORDERED IN ED: Medications - No data to display   IMPRESSION / MDM / ASSESSMENT AND PLAN / ED COURSE  I reviewed  the triage vital signs and the nursing notes.                              Differential diagnosis includes, but is not limited to, anemia, ACS, electrolyte abnormality  Patient's presentation is most consistent with acute presentation with potential threat to life or bodily function.   Patient presented to the emergency department today after an episode of her blood pressure dropping and some lightheadedness that occurred while at dialysis.  At the time my exam patient is awake and alert.  She states that she feels better.  Blood work here without significant change in patient's baseline anemia or electrolyte abnormalities.  She does have a slight elevation of her troponin however on chart review it appears she chronically has elevated troponins and this is the lowest value we have on record.  I did have low concern for ACS.  Given reassuring workup and the patient continues to feel improved I think it is reasonable for patient to be discharged. Discussed findings and plan with patient.      FINAL  CLINICAL IMPRESSION(S) / ED DIAGNOSES   Final diagnoses:  Lightheadedness       Note:  This document was prepared using Dragon voice recognition software and may include unintentional dictation errors.    Phineas Semen, MD 04/28/23 [phone]

## 2023-04-30 ENCOUNTER — Emergency Department: Payer: Medicare Other

## 2023-04-30 ENCOUNTER — Other Ambulatory Visit: Payer: Self-pay

## 2023-04-30 ENCOUNTER — Emergency Department
Admission: EM | Admit: 2023-04-30 | Discharge: 2023-04-30 | Disposition: A | Discharge status: Home / Self Care | Payer: Medicare Other | Attending: Emergency Medicine | Admitting: Emergency Medicine

## 2023-04-30 DIAGNOSIS — I5032 Chronic diastolic (congestive) heart failure: Secondary | ICD-10-CM | POA: Insufficient documentation

## 2023-04-30 DIAGNOSIS — Z7901 Long term (current) use of anticoagulants: Secondary | ICD-10-CM | POA: Diagnosis not present

## 2023-04-30 DIAGNOSIS — N186 End stage renal disease: Secondary | ICD-10-CM | POA: Insufficient documentation

## 2023-04-30 DIAGNOSIS — I132 Hypertensive heart and chronic kidney disease with heart failure and with stage 5 chronic kidney disease, or end stage renal disease: Secondary | ICD-10-CM | POA: Diagnosis not present

## 2023-04-30 DIAGNOSIS — Z992 Dependence on renal dialysis: Secondary | ICD-10-CM | POA: Diagnosis not present

## 2023-04-30 DIAGNOSIS — W19XXXA Unspecified fall, initial encounter: Secondary | ICD-10-CM | POA: Insufficient documentation

## 2023-04-30 DIAGNOSIS — Z91010 Allergy to peanuts: Secondary | ICD-10-CM | POA: Insufficient documentation

## 2023-04-30 DIAGNOSIS — Z79899 Other long term (current) drug therapy: Secondary | ICD-10-CM | POA: Diagnosis not present

## 2023-04-30 DIAGNOSIS — M545 Low back pain, unspecified: Secondary | ICD-10-CM | POA: Insufficient documentation

## 2023-04-30 DIAGNOSIS — E1122 Type 2 diabetes mellitus with diabetic chronic kidney disease: Secondary | ICD-10-CM | POA: Insufficient documentation

## 2023-04-30 MED ORDER — TRAMADOL HCL 50 MG PO TABS
50.0000 mg | ORAL_TABLET | Freq: Once | ORAL | Status: AC
  Administered 2023-04-30: 50 mg via ORAL
  Filled 2023-04-30: qty 1

## 2023-04-30 MED ORDER — TRAMADOL HCL 50 MG PO TABS
50.0000 mg | ORAL_TABLET | Freq: Four times a day (QID) | ORAL | 0 refills | Status: DC | PRN
Start: 2023-04-30 — End: 2023-05-09

## 2023-04-30 NOTE — ED Triage Notes (Addendum)
Pt to ed from [facility] via [facility] for a fall. Pt is caox4, in no acute distress and in a wheel chair in triage. Pt had an unwitnessed with no LOC. Pt fell out of her bed from lowest setting per facility staff. Pt is a bilateral below the knee amputee. Pt is also a DM and dialysis pt. Pt complaint of lower back. Pt denies hitting her head. Pt states "I asked the nurse to rub it for me and she said no". Staff did give the pt 2 extra strength tylenol before she was sent out.

## 2023-04-30 NOTE — ED Notes (Signed)
Called [facility] spoke with rep. Baley to establish transport for pt. Back to  health care center.

## 2023-04-30 NOTE — ED Provider Notes (Signed)
[facility] Provider Note    Event Date/Time   First MD Initiated Contact with Patient 04/30/23 0231     (approximate)   History   Fall   HPI  Cindy Osborne is a 57 y.o. female brought to the ED via EMS from [facility] status post mechanical fall with low back pain.  Patient is a double amputee who was sleeping and rolled out of her bed.  Struck her lower back on the nightstand.  Does take Eliquis.  Complains of lower back pain.  Denies striking head or LOC.  States she is here because she asked the facility nurse to rub her back and the nurse refused.  She took Tylenol prior to arrival.  Denies fever/chills, cough, chest pain, shortness of breath, abdominal pain, nausea or vomiting.  Makes very little urine secondary to end-stage renal disease on hemodialysis.     Past Medical History   Past Medical History:  Diagnosis Date   Acute ischemic right MCA stroke (HCC) 03/25/2022   Amputated toe (HCC) 01/31/2012   Right second toe distal phalanx Right great toe   Diabetes mellitus without complication (HCC)    ESRD (end stage renal disease) (HCC)    HFrEF (heart failure with reduced ejection fraction) (HCC)    a. 01/2022 Echo: EF 55-60%, grII DD; b. 01/2023 Echo: EF 35-40%, no rwma, mild LVH, GrII DD, mod MR.   History of diabetic ulcer of foot 04/01/2014   Hyperlipidemia    Hypertension    Moderate mitral regurgitation    a. 01/2022 Echo: mild-mod MR; b. 01/2023 Echo: Mod MR.   PAD (peripheral artery disease) (HCC)    a. s/p bilat BKAs.   Recurrent right pleural effusion    a. 05/2022 s/p thoracentesis (300 ml).   Stroke (cerebrum) (HCC) 03/21/2022   Stroke (HCC) 01/21/2022     Active Problem List   Patient Active Problem List   Diagnosis Date Noted   CHF (congestive heart failure) (HCC) 01/17/2023   Rhinovirus 01/17/2023   Dilated cardiomyopathy (HCC) 01/17/2023   Pleural effusion 01/16/2023   Acute respiratory distress 01/02/2023    Generalized weakness 01/01/2023   Pneumonia of right lung due to infectious organism 05/30/2022   Type II diabetes mellitus with renal manifestations (HCC) 05/30/2022   HLD (hyperlipidemia) 05/30/2022   Obesity with body mass index (BMI) of 30.0 to 39.9 05/30/2022   Abnormal LFTs 05/30/2022   Chronic diastolic CHF (congestive heart failure) (HCC) 05/30/2022   Thyroid nodule 05/30/2022   Pleural effusion on right 05/30/2022   PAF (paroxysmal atrial fibrillation) (HCC) 05/30/2022   Intractable nausea and vomiting 05/27/2022   Weakness of left upper extremity 05/11/2022   History of stroke with current residual effects 05/08/2022   Mood disorder (HCC) 05/08/2022   Pressure injury of skin 04/05/2022   Acute right arterial ischemic stroke, MCA (middle cerebral artery) (HCC) 01/21/2022   Combined forms of age-related cataract of both eyes 06/30/2021   Diabetic macular edema (HCC) 06/30/2021   Panic disorder 03/03/2021   Acquired absence of left leg below knee (HCC) 10/26/2020   PVD (peripheral vascular disease) (HCC) 07/28/2020   Uncontrolled type 2 diabetes mellitus with diabetic peripheral angiopathy without gangrene 02/26/2020   Acquired absence of right leg below knee (HCC) 07/28/2019   Diabetic retinopathy (HCC) 11/23/2016   Slow transit constipation 09/24/2015   Environmental and seasonal allergies 07/09/2015   Anemia in ESRD (end-stage renal disease) (HCC) 07/09/2015   ESRD (end stage renal disease) on  dialysis (HCC) 07/09/2015   Type II diabetes mellitus with complication (HCC) 07/09/2015   Herpes simplex infection 07/09/2015   Hyperlipidemia associated with type 2 diabetes mellitus (HCC) 07/09/2015   Heart & renal disease, hypertensive, with heart failure (HCC) 07/09/2015   Avitaminosis D 07/09/2015   Obstructive apnea 12/15/2014   Essential hypertension 09/11/2013     Past Surgical History   Past Surgical History:  Procedure Laterality Date   angioplasty politeal Left  07/2020   avg for dialysis     CHOLECYSTECTOMY     COLONOSCOPY  08/15/2011   cleared for 10 yrs   IR CT HEAD LTD  01/21/2022   IR CT HEAD LTD  03/21/2022   IR PERCUTANEOUS ART THROMBECTOMY/INFUSION INTRACRANIAL INC DIAG ANGIO  01/21/2022   IR PERCUTANEOUS ART THROMBECTOMY/INFUSION INTRACRANIAL INC DIAG ANGIO  03/21/2022   IR US GUIDE VASC ACCESS RIGHT  01/21/2022   IR US GUIDE VASC ACCESS RIGHT  03/21/2022   LEG AMPUTATION BELOW KNEE Right 04/2019   LEG AMPUTATION BELOW KNEE Left 09/08/2020   RADIOLOGY WITH ANESTHESIA N/A 01/21/2022   Procedure: IR WITH ANESTHESIA;  Surgeon: Radiologist, Medication, MD;  Location: [facility] OR;  Service: Radiology;  Laterality: N/A;   RADIOLOGY WITH ANESTHESIA N/A 03/21/2022   Procedure: IR WITH ANESTHESIA;  Surgeon: Radiologist, Medication, MD;  Location: [facility] OR;  Service: Radiology;  Laterality: N/A;   TOE AMPUTATION Right 10/15/2018     Home Medications   Prior to Admission medications   Medication Sig Start Date End Date Taking? Authorizing Provider  ALPRAZolam (XANAX) 0.25 MG tablet Take 1 tablet (0.25 mg total) by mouth 3 (three) times daily as needed for anxiety. 01/20/23   Arnetha Courser, MD  apixaban (ELIQUIS) 2.5 MG TABS tablet Take 1 tablet (2.5 mg total) by mouth 2 (two) times daily. 11/27/22   Penumalli, Glenford Bayley, MD  benzonatate (TESSALON) 200 MG capsule Take 1 capsule (200 mg total) by mouth 3 (three) times daily. 01/20/23   Arnetha Courser, MD  Doxercalciferol (HECTOROL IV) Doxercalciferol (Hectorol) 12/28/22 12/27/23  [provider]  guaiFENesin (MUCINEX) 600 MG 12 hr tablet Take 1 tablet (600 mg total) by mouth 2 (two) times daily as needed for to loosen phlegm. 01/20/23   Arnetha Courser, MD  lactulose (CHRONULAC) 10 GM/15ML solution Take 30 mLs (20 g total) by mouth daily as needed for mild constipation. 01/11/23   Irean Hong, MD  Methoxy PEG-Epoetin Beta (MIRCERA IJ) Mircera 12/30/22 12/29/23  [provider]  metoprolol succinate (TOPROL XL) 25  MG 24 hr tablet Take 1 tablet (25 mg total) by mouth daily. 12/18/22   Debbe Odea, MD  midodrine (PROAMATINE) 10 MG tablet Take 5 mg by mouth 3 (three) times daily. 12/30/22   [provider]  multivitamin (RENA-VIT) TABS tablet Take 1 tablet by mouth at bedtime. 01/20/23   Arnetha Courser, MD  Simethicone 500 MG CAPS Take 1 tablet by mouth every 8 (eight) hours as needed (gas). 12/28/22   Tommi Rumps, PA-C  sucroferric oxyhydroxide (VELPHORO) 500 MG chewable tablet Chew 500 mg by mouth 3 (three) times daily with meals.    [provider]  torsemide (DEMADEX) 20 MG tablet Take 1 tablet (20 mg total) by mouth every Monday, Wednesday, and Friday. 01/22/23   Arnetha Courser, MD  traZODone (DESYREL) 50 MG tablet TAKE 1 TABLET BY MOUTH AT BEDTIME AS NEEDED FOR SLEEP. 02/06/23   Reubin Milan, MD     Allergies  Augmentin [amoxicillin-pot clavulanate], Atorvastatin, Pioglitazone,  Peanut-containing drug products, Rosuvastatin, Shellfish-derived products, and Sulfa antibiotics   Family History   Family History  Problem Relation Age of Onset   Diabetes Mother    Diabetes Sister      Physical Exam  Triage Vital Signs: ED Triage Vitals  Encounter Vitals Group     BP 04/30/23 0048 (!) 101/50     Systolic BP Percentile --      Diastolic BP Percentile --      Pulse Rate 04/30/23 0046 74     Resp 04/30/23 0046 16     Temp 04/30/23 0046 98 F (36.7 C)     Temp Source 04/30/23 0046 Oral     SpO2 04/30/23 0046 96 %     Weight 04/30/23 0046 202 lb 13.2 oz (92 kg)     Height --      Head Circumference --      Peak Flow --      Pain Score 04/30/23 0046 5     Pain Loc --      Pain Education --      Exclude from Growth Chart --     Updated Vital Signs: BP (!) 101/50   Pulse 74   Temp 98 F (36.7 C) (Oral)   Resp 16   Wt 92 kg   SpO2 96%   BMI 29.95 kg/m    General: Awake, no distress.  CV:  RRR.  Good peripheral perfusion.  Resp:  Normal effort.   CTAB. Abd:  Nontender.  No distention.  Other:  Mild lumbar spine tenderness to palpation.  No ecchymosis noted.  No step-offs or deformities noted.   ED Results / Procedures / Treatments  Labs (all labs ordered are listed, but only abnormal results are displayed) Labs Reviewed - No data to display   EKG  None   RADIOLOGY I have independently visualized and interpreted patient's x-ray as well as noted the radiology interpretation:  Lumbar spine x-ray: Mild degenerative change without acute abnormality  Official radiology report(s): DG Lumbar Spine 2-3 Views  Result Date: 04/30/2023 CLINICAL DATA:  Recent fall with low back pain, initial encounter EXAM: LUMBAR SPINE - 3 VIEW COMPARISON:  None Available. FINDINGS: Five lumbar type vertebral bodies are well visualized. Vertebral body height is well maintained. Mild degenerative anterolisthesis of L4 on L5 is noted. No acute fracture is noted. No soft tissue abnormality is seen. IMPRESSION: Mild degenerative change without acute abnormality. Electronically Signed   By: Alcide Clever M.D.   On: 04/30/2023 03:28     PROCEDURES:  Critical Care performed: No  Procedures   MEDICATIONS ORDERED IN ED: Medications  traMADol (ULTRAM) tablet 50 mg (50 mg Oral Given 04/30/23 0240)     IMPRESSION / MDM / ASSESSMENT AND PLAN / ED COURSE  I reviewed the triage vital signs and the nursing notes.                             57 year old female presenting with low back pain status post mechanical fall.  States she has taken tramadol before with good effect; will order tablet.  Obtain imaging study and reassess.  Patient's presentation is most consistent with acute, uncomplicated illness.  4696 X-ray negative for acute osseous injury.  Will discharge back to facility with as needed Tramadol prescription.  Strict return precautions given.  Patient verbalizes understanding and agrees with plan of care.  FINAL CLINICAL IMPRESSION(S) / ED  DIAGNOSES  Final diagnoses:  Fall, initial encounter  Acute midline low back pain without sciatica     Rx / DC Orders   ED Discharge Orders     None        Note:  This document was prepared using Dragon voice recognition software and may include unintentional dictation errors.   Irean Hong, MD 04/30/23 0600

## 2023-04-30 NOTE — Discharge Instructions (Signed)
You may take Tramadol as needed for pain.  Return to the ER for worsening symptoms, persistent vomiting, difficulty breathing or other concerns.

## 2023-05-03 ENCOUNTER — Telehealth: Payer: Self-pay | Admitting: Internal Medicine

## 2023-05-03 NOTE — Telephone Encounter (Signed)
Called and gave verbal okay.  - Cindy Osborne

## 2023-05-03 NOTE — Telephone Encounter (Signed)
Cindy Osborne with [facility] called asking if Dr. Otho Darner would give TO an PT and nursing.  Please call Cindy Osborne back at [phone].  She wants to know if she could be called back to get this set up.

## 2023-05-04 ENCOUNTER — Ambulatory Visit: Payer: Federal, State, Local not specified - PPO | Admitting: Internal Medicine

## 2023-05-06 ENCOUNTER — Other Ambulatory Visit: Payer: Self-pay

## 2023-05-06 ENCOUNTER — Emergency Department
Admission: EM | Admit: 2023-05-06 | Discharge: 2023-05-06 | Disposition: A | Discharge status: Home / Self Care | Payer: Medicare Other | Attending: Emergency Medicine | Admitting: Emergency Medicine

## 2023-05-06 ENCOUNTER — Emergency Department: Payer: Medicare Other

## 2023-05-06 DIAGNOSIS — K5900 Constipation, unspecified: Secondary | ICD-10-CM | POA: Insufficient documentation

## 2023-05-06 NOTE — ED Triage Notes (Signed)
Pt reports constipation with last BM on Wednesday. Pt reports she has been taking stool softeners but has been out of them. Pt reports feeling like she is impacted with stool in her rectum, pt reports pain to rectum. Pt took lactulose today without any relief, denies abdominal pain or emesis.

## 2023-05-06 NOTE — Discharge Instructions (Signed)
Continue to use the lactulose, we could also try MiraLAX 1-2 capfuls per day  Drink plenty of water  Follow-up with your PCP return to the ED with any worsening symptoms such as severe pain

## 2023-05-06 NOTE — ED Provider Notes (Signed)
[facility] Provider Note    Event Date/Time   First MD Initiated Contact with Patient 05/06/23 0430     (approximate)   History   Constipation and Rectal Pain   HPI  Cindy Osborne is a 57 y.o. female who presents to the ED for evaluation of Constipation and Rectal Pain   Patient presents for evaluation of constipation.  Reports that she has not passed much stool since Wednesday, still passing gas and small amounts of stool but feels like it is stuck in her rectum.  She took some lactulose prior to arrival and reports passing small amount of stool in the toilet in the room prior to my evaluation.  No abdominal pain, emesis, hematochezia or melena   Physical Exam   Triage Vital Signs: ED Triage Vitals [05/06/23 0233]  Encounter Vitals Group     BP 99/65     Systolic BP Percentile      Diastolic BP Percentile      Pulse Rate 69     Resp 16     Temp 98.7 F (37.1 C)     Temp src      SpO2 98 %     Weight      Height      Head Circumference      Peak Flow      Pain Score 8     Pain Loc      Pain Education      Exclude from Growth Chart     Most recent vital signs: Vitals:   05/06/23 0233  BP: 99/65  Pulse: 69  Resp: 16  Temp: 98.7 F (37.1 C)  SpO2: 98%    General: Awake, no distress.  CV:  Good peripheral perfusion.  Resp:  Normal effort.  Abd:  No distention.  Soft and benign throughout MSK:  No deformity noted.  Neuro:  No focal deficits appreciated. Other:  Rectal exam, chaperoned by nursing staff, demonstrates a small amount of mobile stool in the rectum without evidence of significant impaction   ED Results / Procedures / Treatments   Labs (all labs ordered are listed, but only abnormal results are displayed) Labs Reviewed - No data to display  EKG   RADIOLOGY Acute chest series interpreted by me with a right-sided pleural effusion, constipation without signs of SBO  Official radiology report(s): No results  found.  PROCEDURES and INTERVENTIONS:  Procedures  Medications - No data to display   IMPRESSION / MDM / ASSESSMENT AND PLAN / ED COURSE  I reviewed the triage vital signs and the nursing notes.  Differential diagnosis includes, but is not limited to, constipation, SBO, impaction  {Patient presents with symptoms of an acute illness or injury that is potentially life-threatening.  Patient presents with constipation.  No signs of significant impaction on chaperoned rectal exam.  Discussed medical management and return precautions  Clinical Course as of 05/06/23 0447  Sun May 06, 2023  0447 Reassessed after he got cleaned up after the rectal exam and we discussed medications to assist with constipation at home.  Answered questions [DS]    Clinical Course User Index [DS] Delton Prairie, MD     FINAL CLINICAL IMPRESSION(S) / ED DIAGNOSES   Final diagnoses:  None     Rx / DC Orders   ED Discharge Orders     None        Note:  This document was prepared using Dragon voice recognition software and may include  unintentional dictation errors.   Delton Prairie, MD 05/06/23 [phone]

## 2023-05-07 ENCOUNTER — Other Ambulatory Visit: Payer: Self-pay | Admitting: Internal Medicine

## 2023-05-07 ENCOUNTER — Telehealth: Payer: Self-pay | Admitting: Internal Medicine

## 2023-05-07 NOTE — Telephone Encounter (Signed)
Pt is calling in because she was seen by another provider and was told to contact her PCP regarding getting her medications refilled. Pt says she has pill packets but doesn't know the names of the medications. Pt says she was told that her PCP would know the medications and would be able to send them in. Please advise.

## 2023-05-08 ENCOUNTER — Other Ambulatory Visit: Payer: Self-pay

## 2023-05-08 NOTE — Telephone Encounter (Signed)
Called patient back and left VM explaining we need to know the name of the medications she is requesting along with the pharmacy they need to be sent to. Per her list, Dr Judithann Graves, only prescribed one medication for her.  - Cindy Osborne

## 2023-05-09 ENCOUNTER — Ambulatory Visit (INDEPENDENT_AMBULATORY_CARE_PROVIDER_SITE_OTHER): Payer: Medicare Other | Admitting: Internal Medicine

## 2023-05-09 ENCOUNTER — Encounter: Payer: Self-pay | Admitting: Internal Medicine

## 2023-05-09 VITALS — BP 128/74 | HR 83 | Ht 69.0 in | Wt 215.0 lb

## 2023-05-09 DIAGNOSIS — Z23 Encounter for immunization: Secondary | ICD-10-CM

## 2023-05-09 DIAGNOSIS — F410 Panic disorder [episodic paroxysmal anxiety] without agoraphobia: Secondary | ICD-10-CM

## 2023-05-09 DIAGNOSIS — E118 Type 2 diabetes mellitus with unspecified complications: Secondary | ICD-10-CM | POA: Diagnosis not present

## 2023-05-09 DIAGNOSIS — F5101 Primary insomnia: Secondary | ICD-10-CM

## 2023-05-09 DIAGNOSIS — Z8673 Personal history of transient ischemic attack (TIA), and cerebral infarction without residual deficits: Secondary | ICD-10-CM

## 2023-05-09 DIAGNOSIS — M62830 Muscle spasm of back: Secondary | ICD-10-CM

## 2023-05-09 DIAGNOSIS — I420 Dilated cardiomyopathy: Secondary | ICD-10-CM

## 2023-05-09 DIAGNOSIS — N186 End stage renal disease: Secondary | ICD-10-CM

## 2023-05-09 DIAGNOSIS — E1169 Type 2 diabetes mellitus with other specified complication: Secondary | ICD-10-CM | POA: Diagnosis not present

## 2023-05-09 DIAGNOSIS — K5901 Slow transit constipation: Secondary | ICD-10-CM | POA: Diagnosis not present

## 2023-05-09 DIAGNOSIS — Z7984 Long term (current) use of oral hypoglycemic drugs: Secondary | ICD-10-CM

## 2023-05-09 DIAGNOSIS — E785 Hyperlipidemia, unspecified: Secondary | ICD-10-CM

## 2023-05-09 DIAGNOSIS — Z992 Dependence on renal dialysis: Secondary | ICD-10-CM

## 2023-05-09 MED ORDER — TORSEMIDE 20 MG PO TABS
20.0000 mg | ORAL_TABLET | ORAL | 0 refills | Status: DC
Start: 2023-05-09 — End: 2023-06-20

## 2023-05-09 MED ORDER — METOPROLOL SUCCINATE ER 25 MG PO TB24
25.0000 mg | ORAL_TABLET | Freq: Every day | ORAL | 0 refills | Status: DC
Start: 2023-05-09 — End: 2023-06-06

## 2023-05-09 MED ORDER — LACTULOSE 10 GM/15ML PO SOLN
20.0000 g | Freq: Every day | ORAL | 2 refills | Status: DC | PRN
Start: 2023-05-09 — End: 2023-06-06

## 2023-05-09 MED ORDER — HYDROXYZINE HCL 10 MG PO TABS
10.0000 mg | ORAL_TABLET | Freq: Three times a day (TID) | ORAL | 0 refills | Status: DC | PRN
Start: 2023-05-09 — End: 2023-06-20

## 2023-05-09 MED ORDER — LIDOCAINE 5 % EX PTCH
1.0000 | MEDICATED_PATCH | CUTANEOUS | 0 refills | Status: DC
Start: 2023-05-09 — End: 2023-06-20

## 2023-05-09 MED ORDER — ENTRESTO 24-26 MG PO TABS: 1.0000 | ORAL_TABLET | Freq: Two times a day (BID) | ORAL | 0 refills | Status: DC

## 2023-05-09 MED ORDER — APIXABAN 2.5 MG PO TABS
2.5000 mg | ORAL_TABLET | Freq: Two times a day (BID) | ORAL | 2 refills | Status: DC
Start: 2023-05-09 — End: 2023-06-20

## 2023-05-09 MED ORDER — CLOPIDOGREL BISULFATE 75 MG PO TABS
75.0000 mg | ORAL_TABLET | Freq: Every day | ORAL | 0 refills | Status: DC
Start: 2023-05-09 — End: 2023-06-20

## 2023-05-09 MED ORDER — TRAZODONE HCL 50 MG PO TABS
50.0000 mg | ORAL_TABLET | Freq: Every evening | ORAL | 0 refills | Status: DC | PRN
Start: 2023-05-09 — End: 2023-06-20

## 2023-05-09 MED ORDER — EZETIMIBE 10 MG PO TABS
10.0000 mg | ORAL_TABLET | Freq: Every day | ORAL | 0 refills | Status: DC
Start: 2023-05-09 — End: 2023-06-06

## 2023-05-09 MED ORDER — REPATHA SURECLICK 140 MG/ML ~~LOC~~ SOAJ
140.0000 mg | SUBCUTANEOUS | 0 refills | Status: DC
Start: 2023-05-09 — End: 2023-06-20

## 2023-05-09 MED ORDER — ALPRAZOLAM 0.25 MG PO TABS
0.2500 mg | ORAL_TABLET | Freq: Every evening | ORAL | 0 refills | Status: DC | PRN
Start: 2023-05-09 — End: 2023-06-13

## 2023-05-09 MED ORDER — VELPHORO 500 MG PO CHEW
500.0000 mg | CHEWABLE_TABLET | Freq: Three times a day (TID) | ORAL | 2 refills | Status: DC
Start: 2023-05-09 — End: 2023-06-20

## 2023-05-09 NOTE — Assessment & Plan Note (Deleted)
Not requiring insulin Managed with diet alone since on HD

## 2023-05-09 NOTE — Assessment & Plan Note (Signed)
Not requiring insulin Managed with diet alone since on HD

## 2023-05-09 NOTE — Assessment & Plan Note (Signed)
Continue Lactulose and prn Senna

## 2023-05-09 NOTE — Assessment & Plan Note (Signed)
On metoprolol and Entresto Seeing Cardiology in October

## 2023-05-09 NOTE — Progress Notes (Signed)
Date:  05/09/2023   Name:  Cindy Osborne   DOB:  1966/01/30   MRN:  [id number]   Chief Complaint: Hospitalization Follow-up (Last BM was yesterday and it was normal. Patient taking Senna ) ER follow up.  At [facility] on 05/06/23 with constipation.  Plain film below.  Recommended she continue lactulose and add Miralax 1-2 times per day.  She reports taking Senekot daily now with good results.  Plain film: FINDINGS: Generous heart size. Chronic pleural fluid/thickening with scarring in the right lung base. No acute opacity.   Moderate stool distributed throughout the colon. The rectum is distended to 8 cm.   Extensive arterial calcification. There is an IUD in the pelvis. Cholecystectomy clips.   IMPRESSION: 1. Moderate stool with rectal distention to 8 cm. No evidence of small-bowel obstruction. 2. Chronic pleural effusion and scarring at the right lung base.  Constipation This is a recurrent problem. The problem has been gradually improving since onset. Her stool frequency is 1 time per day. The stool is described as formed. The patient is not on a high fiber diet. She Does not exercise regularly. There has Not been adequate water intake. Associated symptoms include back pain. Pertinent negatives include no abdominal pain, diarrhea or vomiting. Treatments tried: lactulose. The treatment provided mild (improved after PRN senna) relief.  Diabetes She presents for her follow-up diabetic visit. She has type 2 diabetes mellitus. Her disease course has been stable. Hypoglycemia symptoms include nervousness/anxiousness. Pertinent negatives for hypoglycemia include no dizziness or headaches. Pertinent negatives for diabetes include no chest pain, no fatigue and no weakness.  Hyperlipidemia This is a chronic problem. The problem is controlled. Pertinent negatives include no chest pain or shortness of breath. Current antihyperlipidemic treatment includes ezetimibe (and Repatha).    Lab Results   Component Value Date   NA 137 04/28/2023   K 3.4 (L) 04/28/2023   CO2 26 04/28/2023   GLUCOSE 86 04/28/2023   BUN 15 04/28/2023   CREATININE 4.09 (H) 04/28/2023   CALCIUM 9.2 04/28/2023   GFRNONAA 12 (L) 04/28/2023   Lab Results  Component Value Date   CHOL 107 03/22/2022   HDL 35 (L) 03/22/2022   LDLCALC 55 03/22/2022   TRIG 84 03/22/2022   CHOLHDL 3.1 03/22/2022   Lab Results  Component Value Date   TSH 2.546 01/01/2023   Lab Results  Component Value Date   HGBA1C 5.3 01/01/2023   Lab Results  Component Value Date   WBC 5.7 04/28/2023   HGB 11.2 (L) 04/28/2023   HCT 35.4 (L) 04/28/2023   MCV 96.5 04/28/2023   PLT 78 (L) 04/28/2023   Lab Results  Component Value Date   ALT 154 (H) 01/17/2023   AST 85 (H) 01/17/2023   ALKPHOS 214 (H) 01/17/2023   BILITOT 3.3 (H) 01/17/2023   No results found for: "25OHVITD2", "25OHVITD3", "VD25OH"   Review of Systems  Constitutional:  Negative for chills, fatigue and unexpected weight change.  HENT:  Negative for nosebleeds and trouble swallowing.   Eyes:  Negative for visual disturbance.  Respiratory:  Negative for cough, chest tightness, shortness of breath and wheezing.   Cardiovascular:  Negative for chest pain and palpitations.  Gastrointestinal:  Positive for constipation. Negative for abdominal pain, diarrhea and vomiting.  Musculoskeletal:  Positive for back pain and gait problem (bilateral LE prostheses).  Neurological:  Negative for dizziness, weakness, light-headedness and headaches.  Psychiatric/Behavioral:  Positive for sleep disturbance. Negative for dysphoric mood. The patient is nervous/anxious.  Patient Active Problem List   Diagnosis Date Noted   Dilated cardiomyopathy (HCC) 01/17/2023   Pleural effusion 01/16/2023   Generalized weakness 01/01/2023   Other disorders of phosphorus metabolism 11/29/2022   HLD (hyperlipidemia) 05/30/2022   Obesity with body mass index (BMI) of 30.0 to 39.9 05/30/2022    Abnormal LFTs 05/30/2022   Chronic diastolic CHF (congestive heart failure) (HCC) 05/30/2022   Thyroid nodule 05/30/2022   Pleural effusion on right 05/30/2022   PAF (paroxysmal atrial fibrillation) (HCC) 05/30/2022   Weakness of left upper extremity 05/11/2022   History of stroke with current residual effects 05/08/2022   Mood disorder (HCC) 05/08/2022   Hx of completed stroke 01/21/2022   Combined forms of age-related cataract of both eyes 06/30/2021   Diabetic macular edema (HCC) 06/30/2021   Panic disorder 03/03/2021   Acquired absence of left leg below knee (HCC) 10/26/2020   PVD (peripheral vascular disease) (HCC) 07/28/2020   Acquired absence of right leg below knee (HCC) 07/28/2019   Diabetic retinopathy (HCC) 11/23/2016   Slow transit constipation 09/24/2015   Environmental and seasonal allergies 07/09/2015   Anemia in ESRD (end-stage renal disease) (HCC) 07/09/2015   ESRD (end stage renal disease) on dialysis (HCC) 07/09/2015   Type II diabetes mellitus with complication (HCC) 07/09/2015   Herpes simplex infection 07/09/2015   Hyperlipidemia associated with type 2 diabetes mellitus (HCC) 07/09/2015   Heart & renal disease, hypertensive, with heart failure (HCC) 07/09/2015   Avitaminosis D 07/09/2015   Obstructive apnea 12/15/2014   Essential hypertension 09/11/2013    Allergies  Allergen Reactions   Augmentin [Amoxicillin-Pot Clavulanate] Shortness Of Breath   Atorvastatin Other (See Comments)    Back pain   Pioglitazone Other (See Comments)    Edema   Peanut-Containing Drug Products Itching   Rosuvastatin Itching and Other (See Comments)    Back pain   Shellfish-Derived Products Itching and Rash    shrimp   Sulfa Antibiotics Rash and Other (See Comments)    Shedding of skin    Past Surgical History:  Procedure Laterality Date   angioplasty politeal Left 07/2020   avg for dialysis     CHOLECYSTECTOMY     COLONOSCOPY  08/15/2011   cleared for 10 yrs    IR CT HEAD LTD  01/21/2022   IR CT HEAD LTD  03/21/2022   IR PERCUTANEOUS ART THROMBECTOMY/INFUSION INTRACRANIAL INC DIAG ANGIO  01/21/2022   IR PERCUTANEOUS ART THROMBECTOMY/INFUSION INTRACRANIAL INC DIAG ANGIO  03/21/2022   IR US GUIDE VASC ACCESS RIGHT  01/21/2022   IR US GUIDE VASC ACCESS RIGHT  03/21/2022   LEG AMPUTATION BELOW KNEE Right 04/2019   LEG AMPUTATION BELOW KNEE Left 09/08/2020   RADIOLOGY WITH ANESTHESIA N/A 01/21/2022   Procedure: IR WITH ANESTHESIA;  Surgeon: Radiologist, Medication, MD;  Location: [facility] OR;  Service: Radiology;  Laterality: N/A;   RADIOLOGY WITH ANESTHESIA N/A 03/21/2022   Procedure: IR WITH ANESTHESIA;  Surgeon: Radiologist, Medication, MD;  Location: [facility] OR;  Service: Radiology;  Laterality: N/A;   TOE AMPUTATION Right 10/15/2018    Social History   Tobacco Use   Smoking status: Never   Smokeless tobacco: Never  Vaping Use   Vaping status: Never Used  Substance Use Topics   Alcohol use: Yes   Drug use: Never     Medication list has been reviewed and updated.  Current Meds  Medication Sig   albuterol (VENTOLIN HFA) 108 (90 Base) MCG/ACT inhaler Inhale 2 puffs into the lungs every  6 (six) hours as needed.   Cholecalciferol (VITAMIN D-1000 MAX ST) 25 MCG (1000 UT) tablet Take by mouth.   Doxercalciferol (HECTOROL IV) Doxercalciferol (Hectorol)   glucose blood test strip    lidocaine (LIDODERM) 5 % Place 1 patch onto the skin daily. Remove & Discard patch within 12 hours or as directed by MD   multivitamin (RENA-VIT) TABS tablet Take 1 tablet by mouth at bedtime.   [DISCONTINUED] ALPRAZolam (XANAX) 0.25 MG tablet Take 1 tablet (0.25 mg total) by mouth 3 (three) times daily as needed for anxiety.   [DISCONTINUED] apixaban (ELIQUIS) 2.5 MG TABS tablet Take 1 tablet (2.5 mg total) by mouth 2 (two) times daily.   [DISCONTINUED] clopidogrel (PLAVIX) 75 MG tablet Take 75 mg by mouth daily.   [DISCONTINUED] ENTRESTO 24-26 MG Take 1 tablet by mouth 2 (two) times  daily.   [DISCONTINUED] ezetimibe (ZETIA) 10 MG tablet Take 10 mg by mouth daily.   [DISCONTINUED] hydrOXYzine (ATARAX) 10 MG tablet Take by mouth.   [DISCONTINUED] melatonin 3 MG TABS tablet Take by mouth.   [DISCONTINUED] metoprolol succinate (TOPROL XL) 25 MG 24 hr tablet Take 1 tablet (25 mg total) by mouth daily.   [DISCONTINUED] midodrine (PROAMATINE) 10 MG tablet Take 5 mg by mouth 3 (three) times daily.   [DISCONTINUED] REPATHA SURECLICK 140 MG/ML SOAJ Inject into the skin.   [DISCONTINUED] sucroferric oxyhydroxide (VELPHORO) 500 MG chewable tablet Chew 500 mg by mouth 3 (three) times daily with meals.   [DISCONTINUED] torsemide (DEMADEX) 20 MG tablet Take 1 tablet (20 mg total) by mouth every Monday, Wednesday, and Friday.   [DISCONTINUED] traZODone (DESYREL) 50 MG tablet TAKE 1 TABLET BY MOUTH AT BEDTIME AS NEEDED FOR SLEEP.       05/09/2023    1:27 PM 01/15/2023    1:41 PM 12/27/2022   11:18 AM 12/13/2022    3:03 PM  GAD 7 : Generalized Anxiety Score  Nervous, Anxious, on Edge 2 3 2 1   Control/stop worrying 3 1 0 3  Worry too much - different things 3 3 0 3  Trouble relaxing 2 3 1 1   Restless 2 3 2  0  Easily annoyed or irritable 3 3 2 3   Afraid - awful might happen 1 0 0 0  Total GAD 7 Score 16 16 7 11   Anxiety Difficulty Somewhat difficult Somewhat difficult Not difficult at all Somewhat difficult       05/09/2023    1:27 PM 01/15/2023    1:40 PM 12/27/2022   11:18 AM  Depression screen PHQ 2/9  Decreased Interest 3 1 2   Down, Depressed, Hopeless 1 1 0  PHQ - 2 Score 4 2 2   Altered sleeping 1 3 0  Tired, decreased energy 1 1 3   Change in appetite 2 3 2   Feeling bad or failure about yourself  0 1 0  Trouble concentrating 2 3 0  Moving slowly or fidgety/restless  2 0  Suicidal thoughts 2 1 0  PHQ-9 Score 12 16 7   Difficult doing work/chores Very difficult Somewhat difficult Somewhat difficult    BP Readings from Last 3 Encounters:  05/09/23 128/74  05/06/23 (!)  101/49  04/30/23 (!) 101/50    Physical Exam Vitals and nursing note reviewed.  Constitutional:      General: She is not in acute distress.    Appearance: Normal appearance. She is well-developed.  HENT:     Head: Normocephalic and atraumatic.  Cardiovascular:     Rate and Rhythm:  Normal rate and regular rhythm.  Pulmonary:     Effort: Pulmonary effort is normal. No respiratory distress.     Breath sounds: No wheezing or rhonchi.  Musculoskeletal:     Cervical back: Normal range of motion.     Thoracic back: Spasms and tenderness present.       Back:  Skin:    General: Skin is warm and dry.     Findings: No rash.  Neurological:     Mental Status: She is alert and oriented to person, place, and time. Mental status is at baseline.     Comments: LUE weakness  Psychiatric:        Mood and Affect: Mood normal.        Behavior: Behavior normal.     Wt Readings from Last 3 Encounters:  05/09/23 215 lb (97.5 kg)  04/30/23 202 lb 13.2 oz (92 kg)  04/28/23 203 lb 7.8 oz (92.3 kg)    BP 128/74   Pulse 83   Ht 5\' 9"  (1.753 m)   Wt 215 lb (97.5 kg)   SpO2 93%   BMI 31.75 kg/m   Assessment and Plan:  Problem List Items Addressed This Visit       Unprioritized   Type II diabetes mellitus with complication (HCC) - Primary (Chronic)    Not requiring insulin Managed with diet alone since on HD      Slow transit constipation (Chronic)    Continue Lactulose and prn Senna      Relevant Medications   lactulose (CHRONULAC) 10 GM/15ML solution   Panic disorder   Relevant Medications   ALPRAZolam (XANAX) 0.25 MG tablet   traZODone (DESYREL) 50 MG tablet   hydrOXYzine (ATARAX) 10 MG tablet   Hyperlipidemia associated with type 2 diabetes mellitus (HCC) (Chronic)    On zetia and Repatha Could not tolerate Pravachol      Relevant Medications   metoprolol succinate (TOPROL XL) 25 MG 24 hr tablet   torsemide (DEMADEX) 20 MG tablet   apixaban (ELIQUIS) 2.5 MG TABS tablet    ENTRESTO 24-26 MG   ezetimibe (ZETIA) 10 MG tablet   REPATHA SURECLICK 140 MG/ML SOAJ   Hx of completed stroke    On Plavix and Eliquis Will discuss with Cardiology next month if both medications are needed      Relevant Medications   apixaban (ELIQUIS) 2.5 MG TABS tablet   clopidogrel (PLAVIX) 75 MG tablet   ESRD (end stage renal disease) on dialysis (HCC) (Chronic)   Relevant Medications   sucroferric oxyhydroxide (VELPHORO) 500 MG chewable tablet   Dilated cardiomyopathy (HCC)    On metoprolol and Entresto Seeing Cardiology in October      Relevant Medications   metoprolol succinate (TOPROL XL) 25 MG 24 hr tablet   torsemide (DEMADEX) 20 MG tablet   apixaban (ELIQUIS) 2.5 MG TABS tablet   ENTRESTO 24-26 MG   ezetimibe (ZETIA) 10 MG tablet   REPATHA SURECLICK 140 MG/ML SOAJ   Other Visit Diagnoses     Muscle spasm of back       continue to use heat as needed. will add OTC lidoderm patch   Relevant Medications   lidocaine (LIDODERM) 5 %   Primary insomnia       Relevant Medications   ALPRAZolam (XANAX) 0.25 MG tablet   traZODone (DESYREL) 50 MG tablet   Need for influenza vaccination       Relevant Orders   Flu vaccine trivalent PF, 6mos and older(Flulaval,Afluria,Fluarix,Fluzone) (  Completed)       No follow-ups on file.    Reubin Milan, MD [facility] [place]

## 2023-05-09 NOTE — Assessment & Plan Note (Signed)
On Plavix and Eliquis Will discuss with Cardiology next month if both medications are needed

## 2023-05-09 NOTE — Assessment & Plan Note (Signed)
On zetia and Repatha Could not tolerate Pravachol

## 2023-05-11 ENCOUNTER — Telehealth: Payer: Self-pay | Admitting: Internal Medicine

## 2023-05-11 NOTE — Telephone Encounter (Signed)
Monica with [facility] calling to review pt's medication list. Verbalizes understanding.

## 2023-05-21 ENCOUNTER — Encounter: Payer: Self-pay | Admitting: Cardiology

## 2023-05-21 ENCOUNTER — Other Ambulatory Visit (INDEPENDENT_AMBULATORY_CARE_PROVIDER_SITE_OTHER): Payer: Medicare Other | Admitting: Internal Medicine

## 2023-05-21 ENCOUNTER — Ambulatory Visit: Payer: Medicare Other | Attending: Cardiology | Admitting: Cardiology

## 2023-05-21 DIAGNOSIS — E785 Hyperlipidemia, unspecified: Secondary | ICD-10-CM

## 2023-05-21 DIAGNOSIS — Z7901 Long term (current) use of anticoagulants: Secondary | ICD-10-CM | POA: Insufficient documentation

## 2023-05-21 DIAGNOSIS — I480 Paroxysmal atrial fibrillation: Secondary | ICD-10-CM

## 2023-05-21 DIAGNOSIS — I420 Dilated cardiomyopathy: Secondary | ICD-10-CM

## 2023-05-21 DIAGNOSIS — I739 Peripheral vascular disease, unspecified: Secondary | ICD-10-CM

## 2023-05-21 DIAGNOSIS — Z794 Long term (current) use of insulin: Secondary | ICD-10-CM | POA: Insufficient documentation

## 2023-05-21 DIAGNOSIS — E041 Nontoxic single thyroid nodule: Secondary | ICD-10-CM

## 2023-05-21 DIAGNOSIS — E118 Type 2 diabetes mellitus with unspecified complications: Secondary | ICD-10-CM

## 2023-05-21 DIAGNOSIS — Z89511 Acquired absence of right leg below knee: Secondary | ICD-10-CM

## 2023-05-21 DIAGNOSIS — I6930 Unspecified sequelae of cerebral infarction: Secondary | ICD-10-CM

## 2023-05-21 DIAGNOSIS — Z7902 Long term (current) use of antithrombotics/antiplatelets: Secondary | ICD-10-CM | POA: Insufficient documentation

## 2023-05-21 DIAGNOSIS — I10 Essential (primary) hypertension: Secondary | ICD-10-CM

## 2023-05-21 DIAGNOSIS — D631 Anemia in chronic kidney disease: Secondary | ICD-10-CM

## 2023-05-21 DIAGNOSIS — F410 Panic disorder [episodic paroxysmal anxiety] without agoraphobia: Secondary | ICD-10-CM

## 2023-05-21 DIAGNOSIS — R531 Weakness: Secondary | ICD-10-CM

## 2023-05-21 DIAGNOSIS — F39 Unspecified mood [affective] disorder: Secondary | ICD-10-CM

## 2023-05-21 DIAGNOSIS — I130 Hypertensive heart and chronic kidney disease with heart failure and stage 1 through stage 4 chronic kidney disease, or unspecified chronic kidney disease: Secondary | ICD-10-CM

## 2023-05-21 DIAGNOSIS — I5032 Chronic diastolic (congestive) heart failure: Secondary | ICD-10-CM

## 2023-05-21 DIAGNOSIS — Z992 Dependence on renal dialysis: Secondary | ICD-10-CM

## 2023-05-21 DIAGNOSIS — Z89512 Acquired absence of left leg below knee: Secondary | ICD-10-CM

## 2023-05-21 NOTE — Progress Notes (Signed)
Received home health orders orders from [facility]. Start of care 05/04/23.   Certification and orders from 05/04/23 through 07/02/23 are reviewed, signed and faxed back to home health company.  Need of intermittent skilled services at home: homebound  The home health care plan has been established by me and will be reviewed and updated as needed to maximize patient recovery.  I certify that all home health services have been and will be furnished to the patient while under my care.  Face-to-face encounter in which the need for home health services was established: 05/09/23.  Patient is receiving home health services for the following diagnoses: Problem List Items Addressed This Visit       Unprioritized   Acquired absence of left leg below knee (HCC) (Chronic)   Acquired absence of right leg below knee (HCC) (Chronic)   Anemia in ESRD (end-stage renal disease) (HCC)   Chronic diastolic CHF (congestive heart failure) (HCC)   Dilated cardiomyopathy (HCC)   Encounter for long-term (current) use of insulin (HCC)   Encounter for long-term (current) use of antiplatelets/antithrombotics   ESRD (end stage renal disease) on dialysis (HCC) (Chronic)   Essential hypertension (Chronic)   Generalized weakness   Heart & renal disease, hypertensive, with heart failure (HCC) (Chronic)   History of stroke with current residual effects (Chronic)   Hyperlipidemia associated with type 2 diabetes mellitus (HCC) (Chronic)   Long term (current) use of anticoagulants   Mood disorder (HCC)   PAF (paroxysmal atrial fibrillation) (HCC)   Panic disorder   PVD (peripheral vascular disease) (HCC) - Primary (Chronic)   Thyroid nodule   Type II diabetes mellitus with complication (HCC) (Chronic)   Other Visit Diagnoses     Hyperlipidemia, unspecified hyperlipidemia type            Bari Edward, MD

## 2023-06-06 ENCOUNTER — Ambulatory Visit (INDEPENDENT_AMBULATORY_CARE_PROVIDER_SITE_OTHER): Payer: Medicare Other | Admitting: Internal Medicine

## 2023-06-06 ENCOUNTER — Encounter: Payer: Self-pay | Admitting: Internal Medicine

## 2023-06-06 ENCOUNTER — Ambulatory Visit: Payer: Self-pay

## 2023-06-06 VITALS — BP 112/76 | HR 85 | Ht 69.0 in | Wt 208.0 lb

## 2023-06-06 DIAGNOSIS — F39 Unspecified mood [affective] disorder: Secondary | ICD-10-CM | POA: Diagnosis not present

## 2023-06-06 MED ORDER — SERTRALINE HCL 50 MG PO TABS: 50.0000 mg | ORAL_TABLET | Freq: Every day | ORAL | 0 refills | Status: DC

## 2023-06-06 NOTE — Telephone Encounter (Signed)
     Chief Complaint: Shortness of breath Symptoms: Above, started yesterday. Thinks "it might be stress" Frequency: Yesterday Pertinent Negatives: Patient denies any other symptoms. Disposition: [] ED /[] Urgent Care (no appt availability in office) / [x] Appointment(In office/virtual)/ []  [facility]/ [] Home Care/ [] Refused Recommended Disposition /[]  Mobile Bus/ []  Follow-up with PCP Additional Notes: Pt. Agrees with appointment.  Reason for Disposition  [1] MILD difficulty breathing (e.g., minimal/no SOB at rest, SOB with walking, pulse <100) AND [2] NEW-onset or WORSE than normal  Answer Assessment - Initial Assessment Questions 1. RESPIRATORY STATUS: "Describe your breathing?" (e.g., wheezing, shortness of breath, unable to speak, severe coughing)      SOB 2. ONSET: "When did this breathing problem begin?"      Yesterday 3. PATTERN "Does the difficult breathing come and go, or has it been constant since it started?"      Comes and goes 4. SEVERITY: "How bad is your breathing?" (e.g., mild, moderate, severe)    - MILD: No SOB at rest, mild SOB with walking, speaks normally in sentences, can lie down, no retractions, pulse < 100.    - MODERATE: SOB at rest, SOB with minimal exertion and prefers to sit, cannot lie down flat, speaks in phrases, mild retractions, audible wheezing, pulse 100-120.    - SEVERE: Very SOB at rest, speaks in single words, struggling to breathe, sitting hunched forward, retractions, pulse > 120      Moderate 5. RECURRENT SYMPTOM: "Have you had difficulty breathing before?" If Yes, ask: "When was the last time?" and "What happened that time?"      Yes 6. CARDIAC HISTORY: "Do you have any history of heart disease?" (e.g., heart attack, angina, bypass surgery, angioplasty)      No 7. LUNG HISTORY: "Do you have any history of lung disease?"  (e.g., pulmonary embolus, asthma, emphysema)     No 8. CAUSE: "What do you think is causing the  breathing problem?"      Unsure 9. OTHER SYMPTOMS: "Do you have any other symptoms? (e.g., dizziness, runny nose, cough, chest pain, fever)     No 10. O2 SATURATION MONITOR:  "Do you use an oxygen saturation monitor (pulse oximeter) at home?" If Yes, ask: "What is your reading (oxygen level) today?" "What is your usual oxygen saturation reading?" (e.g., 95%)       no 11. PREGNANCY: "Is there any chance you are pregnant?" "When was your last menstrual period?"       No 12. TRAVEL: "Have you traveled out of the country in the last month?" (e.g., travel history, exposures)       No  Protocols used: Breathing Difficulty-A-AH

## 2023-06-06 NOTE — Progress Notes (Signed)
Date:  06/06/2023   Name:  Cindy Osborne   DOB:  1966/07/13   MRN:  [id number]   Chief Complaint: Shortness of Breath (Started yesterday morning. )  Anxiety Presents for follow-up visit. Symptoms include insomnia, nervous/anxious behavior and shortness of breath. Patient reports no chest pain, decreased concentration, depressed mood, feeling of choking or palpitations. Symptoms occur most days. The severity of symptoms is causing significant distress. The quality of sleep is poor. Nighttime awakenings: several.      Review of Systems  Constitutional:  Positive for fatigue. Negative for chills and fever.  Respiratory:  Positive for shortness of breath. Negative for chest tightness.   Cardiovascular:  Negative for chest pain and palpitations.  Psychiatric/Behavioral:  Positive for sleep disturbance. Negative for decreased concentration. The patient is nervous/anxious and has insomnia.      Lab Results  Component Value Date   NA 137 04/28/2023   K 3.4 (L) 04/28/2023   CO2 26 04/28/2023   GLUCOSE 86 04/28/2023   BUN 15 04/28/2023   CREATININE 4.09 (H) 04/28/2023   CALCIUM 9.2 04/28/2023   GFRNONAA 12 (L) 04/28/2023   Lab Results  Component Value Date   CHOL 107 03/22/2022   HDL 35 (L) 03/22/2022   LDLCALC 55 03/22/2022   TRIG 84 03/22/2022   CHOLHDL 3.1 03/22/2022   Lab Results  Component Value Date   TSH 2.546 01/01/2023   Lab Results  Component Value Date   HGBA1C 5.3 01/01/2023   Lab Results  Component Value Date   WBC 5.7 04/28/2023   HGB 11.2 (L) 04/28/2023   HCT 35.4 (L) 04/28/2023   MCV 96.5 04/28/2023   PLT 78 (L) 04/28/2023   Lab Results  Component Value Date   ALT 154 (H) 01/17/2023   AST 85 (H) 01/17/2023   ALKPHOS 214 (H) 01/17/2023   BILITOT 3.3 (H) 01/17/2023   No results found for: "25OHVITD2", "25OHVITD3", "VD25OH"   Patient Active Problem List   Diagnosis Date Noted   Encounter for long-term (current) use of insulin (HCC)  05/21/2023   Encounter for long-term (current) use of antiplatelets/antithrombotics 05/21/2023   Long term (current) use of anticoagulants 05/21/2023   Dilated cardiomyopathy (HCC) 01/17/2023   Generalized weakness 01/01/2023   Other disorders of phosphorus metabolism 11/29/2022   Obesity with body mass index (BMI) of 30.0 to 39.9 05/30/2022   Abnormal LFTs 05/30/2022   Chronic diastolic CHF (congestive heart failure) (HCC) 05/30/2022   Thyroid nodule 05/30/2022   Pleural effusion on right 05/30/2022   PAF (paroxysmal atrial fibrillation) (HCC) 05/30/2022   Dependence on hemodialysis (HCC)    Weakness of left upper extremity 05/11/2022   History of stroke with current residual effects 05/08/2022   Mood disorder (HCC) 05/08/2022   Hx of completed stroke 01/21/2022   Combined forms of age-related cataract of both eyes 06/30/2021   Diabetic macular edema (HCC) 06/30/2021   Panic disorder 03/03/2021   Acquired absence of left leg below knee (HCC) 10/26/2020   PVD (peripheral vascular disease) (HCC) 07/28/2020   Acquired absence of right leg below knee (HCC) 07/28/2019   Diabetic retinopathy (HCC) 11/23/2016   Slow transit constipation 09/24/2015   Environmental and seasonal allergies 07/09/2015   Anemia in ESRD (end-stage renal disease) (HCC) 07/09/2015   ESRD (end stage renal disease) on dialysis (HCC) 07/09/2015   Type II diabetes mellitus with complication (HCC) 07/09/2015   Herpes simplex infection 07/09/2015   Hyperlipidemia associated with type 2 diabetes mellitus (HCC) 07/09/2015  Heart & renal disease, hypertensive, with heart failure (HCC) 07/09/2015   Avitaminosis D 07/09/2015   Obstructive apnea 12/15/2014   Essential hypertension 09/11/2013    Allergies  Allergen Reactions   Augmentin [Amoxicillin-Pot Clavulanate] Shortness Of Breath   Atorvastatin Other (See Comments)    Back pain   Pioglitazone Other (See Comments)    Edema   Peanut-Containing Drug Products  Itching   Rosuvastatin Itching and Other (See Comments)    Back pain   Shellfish-Derived Products Itching and Rash    shrimp   Sulfa Antibiotics Rash and Other (See Comments)    Shedding of skin    Past Surgical History:  Procedure Laterality Date   angioplasty politeal Left 07/2020   avg for dialysis     CHOLECYSTECTOMY     COLONOSCOPY  08/15/2011   cleared for 10 yrs   IR CT HEAD LTD  01/21/2022   IR CT HEAD LTD  03/21/2022   IR PERCUTANEOUS ART THROMBECTOMY/INFUSION INTRACRANIAL INC DIAG ANGIO  01/21/2022   IR PERCUTANEOUS ART THROMBECTOMY/INFUSION INTRACRANIAL INC DIAG ANGIO  03/21/2022   IR US GUIDE VASC ACCESS RIGHT  01/21/2022   IR US GUIDE VASC ACCESS RIGHT  03/21/2022   LEG AMPUTATION BELOW KNEE Right 04/2019   LEG AMPUTATION BELOW KNEE Left 09/08/2020   RADIOLOGY WITH ANESTHESIA N/A 01/21/2022   Procedure: IR WITH ANESTHESIA;  Surgeon: Radiologist, Medication, MD;  Location: [facility] OR;  Service: Radiology;  Laterality: N/A;   RADIOLOGY WITH ANESTHESIA N/A 03/21/2022   Procedure: IR WITH ANESTHESIA;  Surgeon: Radiologist, Medication, MD;  Location: [facility] OR;  Service: Radiology;  Laterality: N/A;   TOE AMPUTATION Right 10/15/2018    Social History   Tobacco Use   Smoking status: Never   Smokeless tobacco: Never  Vaping Use   Vaping status: Never Used  Substance Use Topics   Alcohol use: Yes   Drug use: Never     Medication list has been reviewed and updated.  Current Meds  Medication Sig   albuterol (VENTOLIN HFA) 108 (90 Base) MCG/ACT inhaler Inhale 2 puffs into the lungs every 6 (six) hours as needed.   ALPRAZolam (XANAX) 0.25 MG tablet Take 1 tablet (0.25 mg total) by mouth at bedtime as needed for anxiety.   apixaban (ELIQUIS) 2.5 MG TABS tablet Take 1 tablet (2.5 mg total) by mouth 2 (two) times daily.   Cholecalciferol (VITAMIN D-1000 MAX ST) 25 MCG (1000 UT) tablet Take by mouth.   clopidogrel (PLAVIX) 75 MG tablet Take 1 tablet (75 mg total) by mouth daily.    Doxercalciferol (HECTOROL IV) Doxercalciferol (Hectorol)   ENTRESTO 24-26 MG Take 1 tablet by mouth 2 (two) times daily.   glucose blood test strip    hydrOXYzine (ATARAX) 10 MG tablet Take 1 tablet (10 mg total) by mouth every 8 (eight) hours as needed.   lidocaine (LIDODERM) 5 % Place 1 patch onto the skin daily. Remove & Discard patch within 12 hours or as directed by MD   midodrine (PROAMATINE) 10 MG tablet Take 10 mg by mouth 3 (three) times daily.   multivitamin (RENA-VIT) TABS tablet Take 1 tablet by mouth at bedtime.   REPATHA SURECLICK 140 MG/ML SOAJ Inject 140 mg into the skin every 14 (fourteen) days.   sertraline (ZOLOFT) 50 MG tablet Take 1 tablet (50 mg total) by mouth daily.   sucroferric oxyhydroxide (VELPHORO) 500 MG chewable tablet Chew 1 tablet (500 mg total) by mouth 3 (three) times daily with meals.   torsemide (  DEMADEX) 20 MG tablet Take 1 tablet (20 mg total) by mouth every Monday, Wednesday, and Friday.   traZODone (DESYREL) 50 MG tablet Take 1 tablet (50 mg total) by mouth at bedtime as needed. for sleep       06/06/2023    9:59 AM 05/09/2023    1:27 PM 01/15/2023    1:41 PM 12/27/2022   11:18 AM  GAD 7 : Generalized Anxiety Score  Nervous, Anxious, on Edge 2 2 3 2   Control/stop worrying 2 3 1  0  Worry too much - different things 2 3 3  0  Trouble relaxing 0 2 3 1   Restless 2 2 3 2   Easily annoyed or irritable 3 3 3 2   Afraid - awful might happen 1 1 0 0  Total GAD 7 Score 12 16 16 7   Anxiety Difficulty Very difficult Somewhat difficult Somewhat difficult Not difficult at all       06/06/2023    9:58 AM 05/09/2023    1:27 PM 01/15/2023    1:40 PM  Depression screen PHQ 2/9  Decreased Interest 1 3 1   Down, Depressed, Hopeless 1 1 1   PHQ - 2 Score 2 4 2   Altered sleeping 1 1 3   Tired, decreased energy 1 1 1   Change in appetite 2 2 3   Feeling bad or failure about yourself  0 0 1  Trouble concentrating 0 2 3  Moving slowly or fidgety/restless 0  2  Suicidal  thoughts 0 2 1  PHQ-9 Score 6 12 16   Difficult doing work/chores Somewhat difficult Very difficult Somewhat difficult    BP Readings from Last 3 Encounters:  06/06/23 112/76  05/09/23 128/74  05/06/23 (!) 101/49    Physical Exam Vitals and nursing note reviewed.  Constitutional:      General: She is not in acute distress.    Appearance: She is well-developed. She is obese.  HENT:     Head: Normocephalic and atraumatic.  Cardiovascular:     Rate and Rhythm: Normal rate and regular rhythm.  Pulmonary:     Effort: Pulmonary effort is normal. No respiratory distress.     Breath sounds: No decreased breath sounds, wheezing or rhonchi.  Skin:    General: Skin is warm and dry.     Findings: No rash.  Neurological:     Mental Status: She is alert and oriented to person, place, and time.  Psychiatric:        Mood and Affect: Mood normal.        Behavior: Behavior normal.     Wt Readings from Last 3 Encounters:  06/06/23 208 lb (94.3 kg)  05/09/23 215 lb (97.5 kg)  04/30/23 202 lb 13.2 oz (92 kg)    BP 112/76   Pulse 85   Ht 5\' 9"  (1.753 m)   Wt 208 lb (94.3 kg)   SpO2 96%   BMI 30.72 kg/m   Assessment and Plan:  Problem List Items Addressed This Visit       Unprioritized   Mood disorder (HCC) - Primary    Having more anxiety and irritability despite hydroxyzine. Not sleeping well despite Trazodone. Will add Sertraline which has done well in the past. Follow up in 4 weeks if not improving      Relevant Medications   sertraline (ZOLOFT) 50 MG tablet    No follow-ups on file.    Reubin Milan, MD [facility] [place]

## 2023-06-06 NOTE — Assessment & Plan Note (Addendum)
Having more anxiety and irritability despite hydroxyzine. Not sleeping well despite Trazodone. Will add Sertraline which has done well in the past. Follow up in 4 weeks if not improving

## 2023-06-06 NOTE — Patient Instructions (Addendum)
Take Midodrine in the AM every other day before dialysis.  Take Sertraline every evening.

## 2023-06-07 NOTE — Progress Notes (Deleted)
Cardiology Office Note:  .   Date:  06/07/2023  ID:  Cindy Osborne, DOB Sep 25, 1965, MRN [id number] PCP: Reubin Milan, MD  [facility] Providers Cardiologist:  Debbe Odea, MD Electrophysiologist:  Lanier Prude, MD { Click to update primary MD,subspecialty MD or APP then REFRESH:1}   History of Present Illness: .   Cindy Osborne is a 57 y.o. female with a past medical history of paroxysmal atrial fibrillation, hypertension, hyperlipidemia, end-stage renal disease on hemodialysis, diabetes, stroke, right pleural effusion, bilateral arterial disease status post bilateral BKA's who is being seen today for follow-up.  The patient suffered a right MCA stroke in June 2023 secondary to right M1 occlusion, for which she received TNK and mechanical thrombectomy.  Echo at that time revealed an EF of 55 to 60%, moderate LVH, mild MR.  In August 2023 she was admitted with recurrent right MCA stroke and subsequently developed atrial fibrillation with RVR.  She was managed with beta-blocker therapy and apixaban once cleared by neurology.  In the setting of significant bleeding from her dialysis access sites apixaban was reduced to 2.5 mg twice daily in the outpatient setting.  In early May 2024 she presented emergency department with dizziness and palpitations and was found to be relatively hypotensive and tachycardic in atrial fibrillation RVR.  She was again treated with IV diltiazem and converted back to sinus rhythm.  In May 2024 she was placed on Toprol-XL 25 mg daily with saline of the relative hypotension, midodrine was increased 10 mg 3 times daily.  She was referred to EP for consideration of antiarrhythmic therapy unfortunately she required hospitalization at [facility] on May 20 in the setting of dyspnea, fatigue, volume overload.  She was initially treated in the outpatient setting with Levaquin concern for pneumonia however following admission she did significantly better  with hemodialysis and felt the presentation was more consistent with volume overload.  She was seen and evaluated in the emergency department she reports with worsening symptoms as well as cough and clear sputum.  Presenting EKG showed sinus rhythm without acute ST-T wave changes.  Labs were notable for hypokalemia and lactic acidosis.  Respiratory panel was positive for rhinovirus and she was placed on droplet precautions and admitted for further evaluation.  Repeat echocardiogram was performed that revealed LV dysfunction with an EF of 35-40%, G2 DD, moderate mitral regurgitation.  She was evaluated at [facility] for second opinion on her HFrEF.  She underwent left heart catheterization which showed three-vessel coronary artery disease with 100% left circumflex, 90% RCA, diagnosis 90% ostial stenosis and elevated LVEDP.  She underwent successful PCI/DES to the RCA.  Repeat echocardiogram was completed that revealed an LVEF of 25%, mild mitral regurgitation, moderate aortic stenosis  She was evaluated by EP in June 2024 and was in regular rhythm.  Continued on apixaban.  She is not a candidate for invasive EP procedures due to 2 prior CVAs 1 required thrombolytics and mechanical thrombectomy.  Was recommended she follow back up with EP in 6 months to be placed on a ZIO monitor to reassess burden of atrial fibrillation and PVCs.  She has had several emergency department visits for various complaints since her hospital discharge in June 2024.  She returns to clinic today  ROS: 10 point review of systems is reviewed and considered negative with exception what is been listed in the HPI  Studies Reviewed: Marland Kitchen     TTE 6/10/[facility])   Summary   1. The left ventricle  is mildly dilated in size with mildly increased wall  thickness.   2. The left ventricular systolic function is severely decreased, LVEF is  visually estimated at 25%.    3. There is inferior/posterior akinesis.    4. Mitral annular calcification  is present (moderate).    5. The mitral valve leaflets are moderately thickened with mildly reduced  leaflet mobility.    6. There is mild mitral valve regurgitation.    7. There is probably moderate low-flow, low-gradient aortic valve stenosis.    8. The left atrium is moderately dilated in size.    9. The right ventricle is mildly dilated in size, with mildly reduced  systolic function.    10. The right atrium is mildly dilated in size.    11. Technically difficult study due to chest wall/lung interference and lack  of IV access for administration of an ultrasound enhancing agent.   [facility] 01/22/23 ([facility]) FINAL CARDIAC CATHETERIZATION REPORT   CONCLUSIONS:  - 3 vessel coronary artery disease (100% LCx, 99% RCA, diagonals with 90%  ostial stenoses)  - Elevated LVEDP  - 5-10 mm Peak LV to aortic gradient on pullback  RECOMMENDATIONS:  - PCI to RCA Andrey Farmer)   TTE 01/17/23 1. Left ventricular ejection fraction, by estimation, is 35 to 40%. The  left ventricle has moderately decreased function. The left ventricle has  no regional wall motion abnormalities. There is mild left ventricular  hypertrophy. Left ventricular  diastolic parameters are consistent with Grade II diastolic dysfunction  (pseudonormalization).   2. Right ventricular systolic function was not well visualized. The right  ventricular size is not well visualized.   3. The mitral valve is degenerative. Moderate mitral valve regurgitation.  Moderate mitral annular calcification.   4. The aortic valve is calcified. Aortic valve regurgitation is not  visualized.  Risk Assessment/Calculations:    CHA2DS2-VASc Score = 7  {Confirm score is correct.  If not, click here to update score.  REFRESH note.  :1} This indicates a 11.2% annual risk of stroke. The patient's score is based upon: CHF History: 1 HTN History: 1 Diabetes History: 1 Stroke History: 2 Vascular Disease History: 1 Age Score: 0 Gender Score: 1   {This  patient has a significant risk of stroke if diagnosed with atrial fibrillation.  Please consider VKA or DOAC agent for anticoagulation if the bleeding risk is acceptable.   You can also use the SmartPhrase .[email or web address] for documentation.   :[id number]}         Physical Exam:   VS:  There were no vitals taken for this visit.   Wt Readings from Last 3 Encounters:  06/06/23 208 lb (94.3 kg)  05/09/23 215 lb (97.5 kg)  04/30/23 202 lb 13.2 oz (92 kg)    GEN: Well nourished, well developed in no acute distress NECK: No JVD; No carotid bruits CARDIAC: ***RRR, no murmurs, rubs, gallops RESPIRATORY:  Clear to auscultation without rales, wheezing or rhonchi  ABDOMEN: Soft, non-tender, non-distended EXTREMITIES:  No edema; No deformity   ASSESSMENT AND PLAN: .   ***    {Are you ordering a CV Procedure (e.g. stress test, cath, DCCV, TEE, etc)?   Press F2        :[id number]}  Dispo: ***  Signed, Mette Southgate, NP

## 2023-06-08 ENCOUNTER — Encounter: Payer: Self-pay | Admitting: Cardiology

## 2023-06-08 ENCOUNTER — Ambulatory Visit: Payer: Medicare Other | Attending: Cardiology | Admitting: Cardiology

## 2023-06-08 ENCOUNTER — Ambulatory Visit: Payer: Medicare Other | Admitting: Cardiology

## 2023-06-08 ENCOUNTER — Ambulatory Visit: Payer: Self-pay | Admitting: *Deleted

## 2023-06-08 VITALS — BP 127/69 | HR 80 | Ht 69.0 in | Wt 218.0 lb

## 2023-06-08 DIAGNOSIS — Z992 Dependence on renal dialysis: Secondary | ICD-10-CM | POA: Insufficient documentation

## 2023-06-08 DIAGNOSIS — I5020 Unspecified systolic (congestive) heart failure: Secondary | ICD-10-CM | POA: Diagnosis present

## 2023-06-08 DIAGNOSIS — E1169 Type 2 diabetes mellitus with other specified complication: Secondary | ICD-10-CM | POA: Diagnosis present

## 2023-06-08 DIAGNOSIS — I4819 Other persistent atrial fibrillation: Secondary | ICD-10-CM | POA: Insufficient documentation

## 2023-06-08 DIAGNOSIS — I959 Hypotension, unspecified: Secondary | ICD-10-CM | POA: Insufficient documentation

## 2023-06-08 DIAGNOSIS — N186 End stage renal disease: Secondary | ICD-10-CM | POA: Insufficient documentation

## 2023-06-08 DIAGNOSIS — G4733 Obstructive sleep apnea (adult) (pediatric): Secondary | ICD-10-CM | POA: Diagnosis present

## 2023-06-08 DIAGNOSIS — I10 Essential (primary) hypertension: Secondary | ICD-10-CM | POA: Insufficient documentation

## 2023-06-08 DIAGNOSIS — E785 Hyperlipidemia, unspecified: Secondary | ICD-10-CM | POA: Diagnosis present

## 2023-06-08 DIAGNOSIS — I63311 Cerebral infarction due to thrombosis of right middle cerebral artery: Secondary | ICD-10-CM | POA: Diagnosis present

## 2023-06-08 DIAGNOSIS — E118 Type 2 diabetes mellitus with unspecified complications: Secondary | ICD-10-CM | POA: Diagnosis present

## 2023-06-08 DIAGNOSIS — I2510 Atherosclerotic heart disease of native coronary artery without angina pectoris: Secondary | ICD-10-CM | POA: Insufficient documentation

## 2023-06-08 DIAGNOSIS — I739 Peripheral vascular disease, unspecified: Secondary | ICD-10-CM | POA: Diagnosis present

## 2023-06-08 NOTE — Telephone Encounter (Signed)
Message from [place] E sent at 06/08/2023  8:10 AM EDT  Summary: Med problem, requesting an alternative to Zoloft   Pt called requesting an alternative to sertraline (ZOLOFT) 50 MG tablet because the pharmacist told her this would make her bleed, she is a dialysis patient and is very concerned about this. Please advise  [id number] - [place], [place] - [address], [place] [address] Phone: [phone]  Fax: [phone]          Call History  Contact Date/Time Type Contact Phone/Fax User  06/08/2023 08:09 AM EDT Phone (Incoming) Cindy Osborne, Cindy Osborne (Self) [phone] Judie Petit) Randol Kern   Reason for Disposition  [1] Caller has URGENT medicine question about med that PCP or specialist prescribed AND [2] triager unable to answer question    Issue with the Zoloft  Answer Assessment - Initial Assessment Questions 1. NAME of MEDICINE: "What medicine(s) are you calling about?"     Sertraline 50 mg 2. QUESTION: "What is your question?" (e.g., double dose of medicine, side effect)     The pharmacist said this could make her bleed.  She is concerned because she is a dialysis pt and already bleeds very easily. 3. PRESCRIBER: "Who prescribed the medicine?" Reason: if prescribed by specialist, call should be referred to that group.     Dr. Judithann Graves 4. SYMPTOMS: "Do you have any symptoms?" If Yes, ask: "What symptoms are you having?"  "How bad are the symptoms (e.g., mild, moderate, severe)     No 5. PREGNANCY:  "Is there any chance that you are pregnant?" "When was your last menstrual period?"     N/A due to age  Protocols used: Medication Question Call-A-AH

## 2023-06-08 NOTE — Patient Instructions (Signed)
Medication Instructions:  Your physician recommends that you continue on your current medications as directed. Please refer to the Current Medication list given to you today.  *If you need a refill on your cardiac medications before your next appointment, please call your pharmacy*  Lab Work: Your provider would like for you to return in 1 - 2 months to have the following labs drawn: fasting lipid panel.   Please go to [facility] [address] [id number], [place] [address] You do not need an appointment.  They are open from 7:30 am-4 pm.  Lunch from 1:00 pm- 2:00 pm You DO need to be fasting.   If you have labs (blood work) drawn today and your tests are completely normal, you will receive your results only by: MyChart Message (if you have MyChart) OR A paper copy in the mail If you have any lab test that is abnormal or we need to change your treatment, we will call you to review the results.  Testing/Procedures: Your physician has requested that you have an echocardiogram. Echocardiography is a painless test that uses sound waves to create images of your heart. It provides your doctor with information about the size and shape of your heart and how well your heart's chambers and valves are working. This procedure takes approximately one hour. There are no restrictions for this procedure. Please do NOT wear cologne, perfume, aftershave, or lotions (deodorant is allowed). Please arrive 15 minutes prior to your appointment time.   Follow-Up: At [facility], you and your health needs are our priority.  As part of our continuing mission to provide you with exceptional heart care, we have created designated Provider Care Teams.  These Care Teams include your primary Cardiologist (physician) and Advanced Practice Providers (APPs -  Physician Assistants and Nurse Practitioners) who all work together to provide you with the care you need, when you need  it.  Your next appointment:   2 month(s)  Provider:   Charlsie Quest, NP    Other Instructions - None ordered

## 2023-06-08 NOTE — Progress Notes (Signed)
Cardiology Office Note:  .   Date:  06/08/2023  ID:  Cindy Osborne, DOB August 25, 1965, MRN [id number] PCP: Cindy Milan, MD  [facility] Providers Cardiologist:  Cindy Odea, MD Electrophysiologist:  Cindy Prude, MD    History of Present Illness: .   Cindy Osborne is a 57 y.o. female with a past medical history of paroxysmal atrial fibrillation, hypertension, hyperlipidemia, end-stage renal disease on hemodialysis, diabetes, stroke, right pleural effusion, bilateral arterial disease status post bilateral BKA's who is being seen today for follow-up.  The patient suffered a right MCA stroke in June 2023 secondary to right M1 occlusion, for which she received TNK and mechanical thrombectomy.  Echo at that time revealed an EF of 55 to 60%, moderate LVH, mild MR.  In August 2023 she was admitted with recurrent right MCA stroke and subsequently developed atrial fibrillation with RVR.  She was managed with beta-blocker therapy and apixaban once cleared by neurology.  In the setting of significant bleeding from her dialysis access sites apixaban was reduced to 2.5 mg twice daily in the outpatient setting.  In early May 2024 she presented emergency department with dizziness and palpitations and was found to be relatively hypotensive and tachycardic in atrial fibrillation RVR.  She was again treated with IV diltiazem and converted back to sinus rhythm.  In May 2024 she was placed on Toprol-XL 25 mg daily with saline of the relative hypotension, midodrine was increased 10 mg 3 times daily.  She was referred to EP for consideration of antiarrhythmic therapy unfortunately she required hospitalization at [facility] on May 20 in the setting of dyspnea, fatigue, volume overload.  She was initially treated in the outpatient setting with Levaquin concern for pneumonia however following admission she did significantly better with hemodialysis and felt the presentation was more consistent with  volume overload.  She was seen and evaluated in the emergency department she reports with worsening symptoms as well as cough and clear sputum.  Presenting EKG showed sinus rhythm without acute ST-T wave changes.  Labs were notable for hypokalemia and lactic acidosis.  Respiratory panel was positive for rhinovirus and she was placed on droplet precautions and admitted for further evaluation.  Repeat echocardiogram was performed that revealed LV dysfunction with an EF of 35-40%, G2 DD, moderate mitral regurgitation.  She was evaluated at [facility] for second opinion on her HFrEF.  She underwent left heart catheterization which showed three-vessel coronary artery disease with 100% left circumflex, 90% RCA, diagnosis 90% ostial stenosis and elevated LVEDP.  She underwent successful PCI/DES to the RCA.  Repeat echocardiogram was completed that revealed an LVEF of 25%, mild mitral regurgitation, moderate aortic stenosis  She was evaluated by EP in June 2024 and was in regular rhythm.  Continued on apixaban.  She is not a candidate for invasive EP procedures due to 2 prior CVAs 1 required thrombolytics and mechanical thrombectomy.  Was recommended she follow back up with EP in 6 months to be placed on a ZIO monitor to reassess burden of atrial fibrillation and PVCs.  She has had several emergency department visits for various complaints since her hospital discharge in June 2024.  She returns to clinic today accompanied by family member. She denies any chest pain, shortness of breath, or edema. She has complaints of fatigue and in concerned about taking Zoloft she was recently prescribed due to the pharmacy telling her that it can increase the risk of her bleeding. She already has concerns of bleeding being  on Eliquis. She denies any blood noted in her stool or urine. She also has many questions about current medications that she is on and if they are needed. She recently underwent a LHC at [facility] and stated that she ws  started on too many different medications at that time. She had a recent emergency department visit related to constipation.   ROS: 10 point review of systems is reviewed and considered negative with exception what is been listed in the HPI  Studies Reviewed: Marland Kitchen   EKG Interpretation Date/Time:  Friday June 08 2023 13:15:25 EDT Ventricular Rate:  80 PR Interval:  172 QRS Duration:  114 QT Interval:  444 QTC Calculation: 512 R Axis:   -34  Text Interpretation: Normal sinus rhythm Left axis deviation Moderate voltage criteria for LVH, may be normal variant When compared with ECG of 28-Apr-2023 15:28, Confirmed by Charlsie Quest ([id number]) on 06/08/2023 1:20:02 PM TTE 01/22/2044 [facility])   Summary   1. The left ventricle is mildly dilated in size with mildly increased wall  thickness.   2. The left ventricular systolic function is severely decreased, LVEF is  visually estimated at 25%.    3. There is inferior/posterior akinesis.    4. Mitral annular calcification is present (moderate).    5. The mitral valve leaflets are moderately thickened with mildly reduced  leaflet mobility.    6. There is mild mitral valve regurgitation.    7. There is probably moderate low-flow, low-gradient aortic valve stenosis.    8. The left atrium is moderately dilated in size.    9. The right ventricle is mildly dilated in size, with mildly reduced  systolic function.    10. The right atrium is mildly dilated in size.    11. Technically difficult study due to chest wall/lung interference and lack  of IV access for administration of an ultrasound enhancing agent.   LHC 01/22/23 ([facility]) FINAL CARDIAC CATHETERIZATION REPORT   CONCLUSIONS:  - 3 vessel coronary artery disease (100% LCx, 99% RCA, diagonals with 90%  ostial stenoses)  - Elevated LVEDP  - 5-10 mm Peak LV to aortic gradient on pullback  RECOMMENDATIONS:  - PCI to RCA Andrey Farmer)   TTE 01/17/23 1. Left ventricular ejection fraction, by estimation, is  35 to 40%. The  left ventricle has moderately decreased function. The left ventricle has  no regional wall motion abnormalities. There is mild left ventricular  hypertrophy. Left ventricular  diastolic parameters are consistent with Grade II diastolic dysfunction  (pseudonormalization).   2. Right ventricular systolic function was not well visualized. The right  ventricular size is not well visualized.   3. The mitral valve is degenerative. Moderate mitral valve regurgitation.  Moderate mitral annular calcification.   4. The aortic valve is calcified. Aortic valve regurgitation is not  visualized.  Risk Assessment/Calculations:    CHA2DS2-VASc Score = 7   This indicates a 11.2% annual risk of stroke. The patient's score is based upon: CHF History: 1 HTN History: 1 Diabetes History: 1 Stroke History: 2 Vascular Disease History: 1 Age Score: 0 Gender Score: 1            Physical Exam:   VS:  BP 127/69 (BP Location: Right Arm, Patient Position: Sitting, Cuff Size: Large)   Pulse 80   Ht 5\' 9"  (1.753 m)   Wt 218 lb (98.9 kg)   SpO2 98%   BMI 32.19 kg/m    Wt Readings from Last 3 Encounters:  06/08/23 218 lb (98.9  kg)  06/06/23 208 lb (94.3 kg)  05/09/23 215 lb (97.5 kg)    GEN: Well nourished, well developed in no acute distress NECK: No JVD; No carotid bruits CARDIAC: RRR, I/VI systolic murmur without rubs or gallops RESPIRATORY:  Clear to auscultation without rales, wheezing or rhonchi  ABDOMEN: Soft, non-tender, non-distended EXTREMITIES:  No edema; No deformity, bilateral BKA  ASSESSMENT AND PLAN: .   Coronary artery disease of the native coronary artery without angina.  Recent left heart catheterization completed at [facility] with PCI to the RCA.  Catheterization revealed three-vessel coronary artery disease (100% left circumflex, 90% RCA, diagnosed with 90% ostial stenosis).  EKG today reveals a sinus rhythm with a rate of 80 with left axis deviation and LVH.  No acute  change.  She is continued on apixaban 2.5 mg twice daily in lieu of aspirin, clopidogrel 75 mg daily, ezetimibe 10 mg daily, and Repatha 140 mg subcutaneously every 2 weeks.  HFrEF /ICM with last LVEF 25% June 2024.  Left heart catheterization as above.  Started on GDMT at [facility] 24/26 mg twice daily, unable to add SGLT2 inhibitors due to hemodialysis or MRA.  She is also on torsemide 20 mg on her off days from dialysis.  Repeat echocardiogram has been ordered to reevaluate heart function after being on GDMT for several months.  Fluid removal is being managed by dialysis.  She was previously on metoprolol XL 25 mg daily which she is unsure where the medication was discontinued.  Last noted improvement to good cardiology stated patient we discharged on Toprol-XL.  Will restart on return.  Paroxysmal atrial fibrillation is maintaining sinus rhythm today.  She is continued on apixaban 2.5 mg twice daily for CHA2DS2-VASc score of 7 for stroke prophylaxis.  She denies thickening of blood in her urine or stool.  History of two R MCA strokes likely due to paroxysmal atrial fibrillation.  She is continued on apixaban 2.5 mg twice daily.  Hypertension with a history of hypertension with blood pressure at 127/69.  She is continued on Entresto 24/26 mg twice daily.  She is also on midodrine 10 mg 3 times a day to maintain a blood pressure which is not listed on her medication list today.  Family member that is with her today is going to determine if she starts medication at home.  She continues to have bouts of hypotension at dialysis which may likely need to take this on nondialysis days.  Hyperlipidemia she is continued on ezetimibe 10 mg and Repatha 140 mg subcu every 2 weeks.  LDL was 113 when she started on injectables.  Due for repeat lipid panel.  Type 2 diabetes with hemoglobin A1c of 6.2.  She is continued on her current therapy.  This continues to be managed by her PCP.  End-stage renal disease  on dialysis secondary to her diabetes.  She states she has not had any missed sessions.  She remains on Tuesday Thursday and Saturday.  Fluid removal has not been continued by nephrology.  PVD with bilateral BKA's.  She is continued on apixaban and lieu of aspirin due to excessive bleeding and her statin medications.  Obstructive sleep apnea where she states that she is compliant with CPAP nightly.       Dispo: Patient to return to clinic to see MD/APP in 2 months or sooner if needed for reevaluation of symptoms  Signed, Loris Winrow, NP

## 2023-06-08 NOTE — Telephone Encounter (Signed)
  Chief Complaint: Concerned about the sertraline 50 mg making her bleed per the pharmacist   She is a dialysis pt and bleeds easily anyway. Symptoms: No Frequency: N/A Pertinent Negatives: Patient denies N/A Disposition: [] ED /[] Urgent Care (no appt availability in office) / [] Appointment(In office/virtual)/ []  [facility]/ [] Home Care/ [] Refused Recommended Disposition /[] [facility]/ [x]  Follow-up with PCP Additional Notes: Message sent to Dr. Judithann Graves.   Pt. Agreeable to someone calling her back.

## 2023-06-11 ENCOUNTER — Telehealth: Payer: Self-pay | Admitting: Internal Medicine

## 2023-06-11 NOTE — Telephone Encounter (Signed)
Copied from CRM [id number]. Topic: General - Other >> Jun 11, 2023  7:57 AM Everette C wrote: Reason for CRM: The patient would like to be prescribed an alternative to sertraline (ZOLOFT) 50 MG tablet [[id number]]  The patient has read of the side effects of the medication and would like to discuss further with a member of clinical staff   Please contact further when possible

## 2023-06-11 NOTE — Telephone Encounter (Signed)
Patient informed. Patient will try medication.

## 2023-06-13 ENCOUNTER — Other Ambulatory Visit: Payer: Self-pay | Admitting: Internal Medicine

## 2023-06-13 DIAGNOSIS — F5101 Primary insomnia: Secondary | ICD-10-CM

## 2023-06-13 MED ORDER — ALPRAZOLAM 0.25 MG PO TABS: 0.2500 mg | ORAL_TABLET | Freq: Every evening | ORAL | 0 refills | Status: DC | PRN

## 2023-06-13 NOTE — Telephone Encounter (Unsigned)
Copied from CRM [id number]. Topic: General - Other >> Jun 13, 2023  8:23 AM Everette C wrote: Reason for CRM: Medication Refill - Medication: ALPRAZolam (XANAX) 0.25 MG tablet [[id number]]    Has the patient contacted their pharmacy? Yes.   (Agent: If no, request that the patient contact the pharmacy for the refill. If patient does not wish to contact the pharmacy document the reason why and proceed with request.) (Agent: If yes, when and what did the pharmacy advise?)  Preferred Pharmacy (with phone number or street name): [id number] [place],  - [address] [place] [address] Phone: [phone] Fax: [phone] Hours: Not open 24 hours   Has the patient been seen for an appointment in the last year OR does the patient have an upcoming appointment? Yes.    Agent: Please be advised that RX refills may take up to 3 business days. We ask that you follow-up with your pharmacy.

## 2023-06-13 NOTE — Telephone Encounter (Signed)
Requested medication (s) are due for refill today: Yes  Requested medication (s) are on the active medication list: Yes  Last refill:  05/09/23  Future visit scheduled: Yes  Notes to clinic:  Not delegated.    Requested Prescriptions  Pending Prescriptions Disp Refills   ALPRAZolam (XANAX) 0.25 MG tablet 30 tablet 0    Sig: Take 1 tablet (0.25 mg total) by mouth at bedtime as needed for anxiety.     Not Delegated - Psychiatry: Anxiolytics/Hypnotics 2 Failed - 06/13/2023  8:55 AM      Failed - This refill cannot be delegated      Failed - Urine Drug Screen completed in last 360 days      Passed - Patient is not pregnant      Passed - Valid encounter within last 6 months    Recent Outpatient Visits           1 week ago Mood disorder [facility])   [place] [facility] at [facility], Nyoka Cowden, MD   1 month ago Type II diabetes mellitus with complication [facility])   [place] [facility] at [facility], Nyoka Cowden, MD   4 months ago Slow transit constipation   [place] [facility] at [facility], Nyoka Cowden, MD   5 months ago SOB (shortness of breath) on exertion   [facility] at [facility], Nyoka Cowden, MD   6 months ago Heart & renal disease, hypertensive, with heart failure [facility])   [place] [facility] at [facility], Nyoka Cowden, MD       Future Appointments             In 1 month Judithann Graves, Nyoka Cowden, MD [facility] at [facility], [facility]   In 2 months Debbe Odea, MD [facility] at [facility]

## 2023-06-15 ENCOUNTER — Other Ambulatory Visit: Payer: Self-pay | Admitting: Internal Medicine

## 2023-06-15 DIAGNOSIS — I420 Dilated cardiomyopathy: Secondary | ICD-10-CM

## 2023-06-19 ENCOUNTER — Inpatient Hospital Stay
Admission: EM | Admit: 2023-06-19 | Discharge: 2023-07-15 | DRG: 308 | Disposition: E | Payer: Medicare Other | Attending: Internal Medicine | Admitting: Internal Medicine

## 2023-06-19 ENCOUNTER — Ambulatory Visit: Payer: Self-pay

## 2023-06-19 ENCOUNTER — Emergency Department: Payer: Medicare Other

## 2023-06-19 ENCOUNTER — Inpatient Hospital Stay: Payer: Medicare Other

## 2023-06-19 DIAGNOSIS — Z1152 Encounter for screening for COVID-19: Secondary | ICD-10-CM

## 2023-06-19 DIAGNOSIS — E1151 Type 2 diabetes mellitus with diabetic peripheral angiopathy without gangrene: Secondary | ICD-10-CM | POA: Diagnosis present

## 2023-06-19 DIAGNOSIS — N186 End stage renal disease: Secondary | ICD-10-CM | POA: Diagnosis present

## 2023-06-19 DIAGNOSIS — Z89511 Acquired absence of right leg below knee: Secondary | ICD-10-CM

## 2023-06-19 DIAGNOSIS — Z66 Do not resuscitate: Secondary | ICD-10-CM | POA: Diagnosis present

## 2023-06-19 DIAGNOSIS — I5023 Acute on chronic systolic (congestive) heart failure: Secondary | ICD-10-CM | POA: Diagnosis present

## 2023-06-19 DIAGNOSIS — E785 Hyperlipidemia, unspecified: Secondary | ICD-10-CM | POA: Diagnosis present

## 2023-06-19 DIAGNOSIS — E872 Acidosis, unspecified: Secondary | ICD-10-CM | POA: Diagnosis present

## 2023-06-19 DIAGNOSIS — I132 Hypertensive heart and chronic kidney disease with heart failure and with stage 5 chronic kidney disease, or end stage renal disease: Secondary | ICD-10-CM | POA: Diagnosis present

## 2023-06-19 DIAGNOSIS — R29810 Facial weakness: Secondary | ICD-10-CM | POA: Diagnosis present

## 2023-06-19 DIAGNOSIS — Z79899 Other long term (current) drug therapy: Secondary | ICD-10-CM

## 2023-06-19 DIAGNOSIS — G9341 Metabolic encephalopathy: Secondary | ICD-10-CM | POA: Insufficient documentation

## 2023-06-19 DIAGNOSIS — I340 Nonrheumatic mitral (valve) insufficiency: Secondary | ICD-10-CM | POA: Diagnosis present

## 2023-06-19 DIAGNOSIS — Z9911 Dependence on respirator [ventilator] status: Secondary | ICD-10-CM

## 2023-06-19 DIAGNOSIS — R570 Cardiogenic shock: Secondary | ICD-10-CM | POA: Insufficient documentation

## 2023-06-19 DIAGNOSIS — Z833 Family history of diabetes mellitus: Secondary | ICD-10-CM | POA: Diagnosis not present

## 2023-06-19 DIAGNOSIS — E1122 Type 2 diabetes mellitus with diabetic chronic kidney disease: Secondary | ICD-10-CM | POA: Diagnosis present

## 2023-06-19 DIAGNOSIS — Z8673 Personal history of transient ischemic attack (TIA), and cerebral infarction without residual deficits: Secondary | ICD-10-CM | POA: Diagnosis not present

## 2023-06-19 DIAGNOSIS — I462 Cardiac arrest due to underlying cardiac condition: Secondary | ICD-10-CM | POA: Diagnosis present

## 2023-06-19 DIAGNOSIS — I4891 Unspecified atrial fibrillation: Secondary | ICD-10-CM | POA: Diagnosis present

## 2023-06-19 DIAGNOSIS — Z7901 Long term (current) use of anticoagulants: Secondary | ICD-10-CM | POA: Diagnosis not present

## 2023-06-19 DIAGNOSIS — I4901 Ventricular fibrillation: Principal | ICD-10-CM | POA: Diagnosis present

## 2023-06-19 DIAGNOSIS — J9601 Acute respiratory failure with hypoxia: Secondary | ICD-10-CM | POA: Diagnosis present

## 2023-06-19 DIAGNOSIS — I469 Cardiac arrest, cause unspecified: Secondary | ICD-10-CM | POA: Diagnosis not present

## 2023-06-19 DIAGNOSIS — Z992 Dependence on renal dialysis: Secondary | ICD-10-CM | POA: Diagnosis not present

## 2023-06-19 DIAGNOSIS — G931 Anoxic brain damage, not elsewhere classified: Secondary | ICD-10-CM | POA: Diagnosis present

## 2023-06-19 DIAGNOSIS — E119 Type 2 diabetes mellitus without complications: Secondary | ICD-10-CM

## 2023-06-19 DIAGNOSIS — I5020 Unspecified systolic (congestive) heart failure: Secondary | ICD-10-CM | POA: Insufficient documentation

## 2023-06-19 DIAGNOSIS — Z7902 Long term (current) use of antithrombotics/antiplatelets: Secondary | ICD-10-CM | POA: Diagnosis not present

## 2023-06-19 DIAGNOSIS — Z89512 Acquired absence of left leg below knee: Secondary | ICD-10-CM

## 2023-06-19 LAB — BLOOD GAS, ARTERIAL
Acid-base deficit: 9.6 mmol/L — ABNORMAL HIGH (ref 0.0–2.0)
Bicarbonate: 18.7 mmol/L — ABNORMAL LOW (ref 20.0–28.0)
FIO2: 100 %
MECHVT: 400 mL
Mechanical Rate: 18
O2 Saturation: 98 %
PEEP: 10 cmH2O
Patient temperature: 37
pCO2 arterial: 49 mm[Hg] — ABNORMAL HIGH (ref 32–48)
pH, Arterial: 7.19 — CL (ref 7.35–7.45)
pO2, Arterial: 98 mm[Hg] (ref 83–108)

## 2023-06-19 LAB — CBC WITH DIFFERENTIAL/PLATELET
Abs Immature Granulocytes: 1.69 10*3/uL — ABNORMAL HIGH (ref 0.00–0.07)
Basophils Absolute: 0.1 10*3/uL (ref 0.0–0.1)
Basophils Relative: 0 %
Eosinophils Absolute: 0.2 10*3/uL (ref 0.0–0.5)
Eosinophils Relative: 1 %
HCT: 32.5 % — ABNORMAL LOW (ref 36.0–46.0)
Hemoglobin: 9.6 g/dL — ABNORMAL LOW (ref 12.0–15.0)
Immature Granulocytes: 8 %
Lymphocytes Relative: 27 %
Lymphs Abs: 6.1 10*3/uL — ABNORMAL HIGH (ref 0.7–4.0)
MCH: 30.7 pg (ref 26.0–34.0)
MCHC: 29.5 g/dL — ABNORMAL LOW (ref 30.0–36.0)
MCV: 103.8 fL — ABNORMAL HIGH (ref 80.0–100.0)
Monocytes Absolute: 0.3 10*3/uL (ref 0.1–1.0)
Monocytes Relative: 1 %
Neutro Abs: 13.9 10*3/uL — ABNORMAL HIGH (ref 1.7–7.7)
Neutrophils Relative %: 63 %
Platelets: 60 10*3/uL — ABNORMAL LOW (ref 150–400)
RBC: 3.13 MIL/uL — ABNORMAL LOW (ref 3.87–5.11)
RDW: 15.4 % (ref 11.5–15.5)
WBC: 22.3 10*3/uL — ABNORMAL HIGH (ref 4.0–10.5)
nRBC: 0.9 % — ABNORMAL HIGH (ref 0.0–0.2)

## 2023-06-19 LAB — TYPE AND SCREEN

## 2023-06-19 LAB — COMPREHENSIVE METABOLIC PANEL
ALT: 76 U/L — ABNORMAL HIGH (ref 0–44)
AST: 159 U/L — ABNORMAL HIGH (ref 15–41)
Albumin: 2.6 g/dL — ABNORMAL LOW (ref 3.5–5.0)
Alkaline Phosphatase: 89 U/L (ref 38–126)
Anion gap: 17 — ABNORMAL HIGH (ref 5–15)
BUN: 11 mg/dL (ref 6–20)
CO2: 23 mmol/L (ref 22–32)
Calcium: 9.6 mg/dL (ref 8.9–10.3)
Chloride: 100 mmol/L (ref 98–111)
Creatinine, Ser: 3.87 mg/dL — ABNORMAL HIGH (ref 0.44–1.00)
GFR, Estimated: 13 mL/min — ABNORMAL LOW (ref >60)
Glucose, Bld: 276 mg/dL — ABNORMAL HIGH (ref 70–99)
Potassium: 3.3 mmol/L — ABNORMAL LOW (ref 3.5–5.1)
Sodium: 140 mmol/L (ref 135–145)
Total Bilirubin: 1.8 mg/dL — ABNORMAL HIGH (ref <1.2)
Total Protein: 5.2 g/dL — ABNORMAL LOW (ref 6.5–8.1)

## 2023-06-19 LAB — CBG MONITORING, ED: Glucose-Capillary: 87 mg/dL (ref 70–99)

## 2023-06-19 LAB — TSH: TSH: 8.82 u[IU]/mL — ABNORMAL HIGH (ref 0.350–4.500)

## 2023-06-19 LAB — T4, FREE: Free T4: 1.04 ng/dL (ref 0.61–1.12)

## 2023-06-19 LAB — MAGNESIUM: Magnesium: 2.2 mg/dL (ref 1.7–2.4)

## 2023-06-19 LAB — TROPONIN I (HIGH SENSITIVITY): Troponin I (High Sensitivity): 496 ng/L (ref <18)

## 2023-06-19 LAB — APTT: aPTT: 44 s — ABNORMAL HIGH (ref 24–36)

## 2023-06-19 LAB — PROTIME-INR
INR: 2.2 — ABNORMAL HIGH (ref 0.8–1.2)
Prothrombin Time: 24.9 s — ABNORMAL HIGH (ref 11.4–15.2)

## 2023-06-19 LAB — LACTIC ACID, PLASMA: Lactic Acid, Venous: >9 mmol/L (ref 0.5–1.9)

## 2023-06-19 MED ORDER — FENTANYL CITRATE PF 50 MCG/ML IJ SOSY: 50.0000 ug | PREFILLED_SYRINGE | INTRAMUSCULAR | Status: DC | PRN

## 2023-06-19 MED ORDER — SODIUM CHLORIDE 0.9 % IV SOLN: 250.0000 mL | INTRAVENOUS | Status: DC

## 2023-06-19 MED ORDER — NOREPINEPHRINE 4 MG/250ML-% IV SOLN: 2.0000 ug/min | INTRAVENOUS | Status: DC

## 2023-06-19 MED ORDER — POLYETHYLENE GLYCOL 3350 17 G PO PACK: 17.0000 g | PACK | Freq: Every day | ORAL | Status: DC | PRN

## 2023-06-19 MED ORDER — DOCUSATE SODIUM 50 MG/5ML PO LIQD: 100.0000 mg | Freq: Two times a day (BID) | ORAL | Status: DC

## 2023-06-19 MED ORDER — PANTOPRAZOLE SODIUM 40 MG IV SOLR: 40.0000 mg | Freq: Every day | INTRAVENOUS | Status: DC

## 2023-06-19 MED ORDER — IOHEXOL 350 MG/ML SOLN
100.0000 mL | Freq: Once | INTRAVENOUS | Status: AC | PRN
  Administered 2023-06-19: 100 mL via INTRAVENOUS

## 2023-06-19 MED ORDER — POLYETHYLENE GLYCOL 3350 17 G PO PACK: 17.0000 g | PACK | Freq: Every day | ORAL | Status: DC

## 2023-06-19 MED ORDER — FENTANYL CITRATE PF 50 MCG/ML IJ SOSY
50.0000 ug | PREFILLED_SYRINGE | Freq: Once | INTRAMUSCULAR | Status: AC
  Administered 2023-06-19: 50 ug via INTRAVENOUS

## 2023-06-19 MED ORDER — NOREPINEPHRINE 4 MG/250ML-% IV SOLN
0.0000 ug/min | INTRAVENOUS | Status: DC
  Administered 2023-06-19: 10 ug/min via INTRAVENOUS

## 2023-06-19 MED ORDER — INSULIN ASPART 100 UNIT/ML IJ SOLN: 0.0000 [IU] | INTRAMUSCULAR | Status: DC

## 2023-06-19 MED ORDER — SODIUM BICARBONATE 8.4 % IV SOLN: 100.0000 meq | Freq: Once | INTRAVENOUS | Status: DC

## 2023-06-19 MED ORDER — PROPOFOL 1000 MG/100ML IV EMUL
5.0000 ug/kg/min | INTRAVENOUS | Status: DC
  Filled 2023-06-19: qty 100

## 2023-06-19 MED ORDER — DOCUSATE SODIUM 50 MG/5ML PO LIQD: 100.0000 mg | Freq: Two times a day (BID) | ORAL | Status: DC | PRN

## 2023-06-19 MED ORDER — FENTANYL CITRATE PF 50 MCG/ML IJ SOSY
PREFILLED_SYRINGE | INTRAMUSCULAR | Status: AC
  Filled 2023-06-19: qty 1

## 2023-06-19 NOTE — Unmapped (Signed)
Specialty Medication(s): Repatha    Ms.Beazer has been dis-enrolled from the [employer] and [facility] specialty pharmacy services due to multiple unsuccessful outreach attempts by the pharmacy.    Additional information provided to the patient: n/a    Camillo Flaming, PharmD  [facility] and [facility] Specialty Pharmacist

## 2023-06-27 ENCOUNTER — Other Ambulatory Visit: Payer: Medicare Other

## 2023-07-15 NOTE — Progress Notes (Signed)
Patient from CT, noted Vfib on Zoll as patient was rolling into room. CPR started, code called, ICU attendings to bedside.

## 2023-07-15 NOTE — ED Notes (Addendum)
CT complete, heading to ICU 10. Afib 124-160.

## 2023-07-15 NOTE — ED Notes (Signed)
EPI given (6)

## 2023-07-15 NOTE — ED Triage Notes (Signed)
BIB [facility] from [facility], had just completed dialysis, enroute with EMS pt became agonal, unresponsive, pulseless and apneic. CPR initiated. 3 amps EPI, 1 amp bicarb, and 1 amp Calcium given. LMA/ I-gel airway placed. Arrives with lucas compressions in PEA. Arrives after of CPR. H/o CVA with L sided residual. Recent R sided droop reported. Bloody emesis difficult to bag. EDP Dr. Fuller Plan present on arrival. R tibia I/O present. BLE BKA.

## 2023-07-15 NOTE — ED Notes (Signed)
CPR continues.  

## 2023-07-15 NOTE — ED Notes (Signed)
Agonal pulse present, CPR continues

## 2023-07-15 NOTE — ED Notes (Signed)
Pulse check, pulseless, lucas continues

## 2023-07-15 NOTE — ED Notes (Signed)
Shocked at 200 j

## 2023-07-15 NOTE — ED Notes (Signed)
Rolling off elevator, Vfib noted on monitor. Into ICU 10 code called, remains V-vib on monitor. PCCM and ICU resumed care and code.

## 2023-07-15 NOTE — ED Notes (Signed)
EDP out to speak with family, CPR continues

## 2023-07-15 NOTE — ED Notes (Signed)
Pulses remain, breathing a little over vent. Afib 128-138.

## 2023-07-15 NOTE — ED Notes (Signed)
CPR contiues, copious bloody secretions suctioned

## 2023-07-15 NOTE — ED Notes (Signed)
Repositioned, HOB raised to 30 degrees.

## 2023-07-15 NOTE — ED Notes (Signed)
Pulses present, ROSC obtained, EPI given (8)

## 2023-07-15 NOTE — ED Notes (Signed)
Levophed started

## 2023-07-15 NOTE — ED Notes (Signed)
EPI given (4), POCUS at [facility] by Dr. Fuller Plan, copius bloody secretions continue, difficult to bag. No pulses. No cardiac motility, no signs of life.

## 2023-07-15 NOTE — ED Notes (Signed)
OG placed ?

## 2023-07-15 NOTE — ED Provider Notes (Signed)
[facility] Provider Note    Event Date/Time   First MD Initiated Contact with Patient 06/24/2023 1702     (approximate)   History   Cardiac Arrest   HPI  Cindy Osborne is a 57 y.o. female with ESRD, diabetes who comes in with concerns for cardiac arrest.  Patient reportedly had a full session of dialysis when she had a cardiac arrest with EMS.  They state that it was initially PEA.  They got 20 minutes of CPR and was transported to the emergency room for further evaluation.  On evaluation in the ER patient had a significant bloody secretions coming out of a eye gel noted otherwise unresponsive therefore unable to get full HPI.  There was concern for maybe some right-sided facial droop but has had prior stroke.   Physical Exam   Triage Vital Signs: ED Triage Vitals  Encounter Vitals Group     BP      Systolic BP Percentile      Diastolic BP Percentile      Pulse      Resp      Temp      Temp src      SpO2      Weight      Height      Head Circumference      Peak Flow      Pain Score      Pain Loc      Pain Education      Exclude from Growth Chart     Most recent vital signs: Vitals:   06/30/2023 1715 07/05/2023 1720  BP:  (!) 140/104  Resp: 11 (!) 21  SpO2: 90%      General: Obtunded  CV:  Chest compressions Resp:  I-gel in place with bloody secretions noted Abd:  No distention.  Other:     ED Results / Procedures / Treatments   Labs (all labs ordered are listed, but only abnormal results are displayed) Labs Reviewed  RESP PANEL BY RT-PCR (RSV, FLU A&B, COVID)  RVPGX2  CULTURE, BLOOD (ROUTINE X 2)  CULTURE, BLOOD (ROUTINE X 2)  CBC WITH DIFFERENTIAL/PLATELET  COMPREHENSIVE METABOLIC PANEL  BLOOD GAS, ARTERIAL  MAGNESIUM  PROTIME-INR  APTT  LACTIC ACID, PLASMA  LACTIC ACID, PLASMA  TSH  T4, FREE  CBG MONITORING, ED  CBG MONITORING, ED  TYPE AND SCREEN  TROPONIN I (HIGH SENSITIVITY)     EKG  My interpretation of  EKG:  Atrial flutter with a rate of 131 without any ST elevation or some diffuse ST depressions, normal intervals  RADIOLOGY I have reviewed the xray personally and interpreted ET tube in place.  PROCEDURES:  Critical Care performed: Yes, see critical care procedure note(s)  .1-3 Lead EKG Interpretation  Performed by: Concha Se, MD Authorized by: Concha Se, MD     Interpretation: abnormal     ECG rate:  130   ECG rate assessment: tachycardic     Rhythm: atrial fibrillation     Ectopy: none     Conduction: normal   Comments:     Initially PEA on the monitor had 1 episode of V-fib and then went into an atrial flutter .Critical Care  Performed by: Concha Se, MD Authorized by: Concha Se, MD   Critical care provider statement:    Critical care time (minutes):  75   Critical care was time spent personally by me on the following activities:  Development of  treatment plan with patient or surrogate, discussions with consultants, evaluation of patient's response to treatment, examination of patient, ordering and review of laboratory studies, ordering and review of radiographic studies, ordering and performing treatments and interventions, pulse oximetry, re-evaluation of patient's condition and review of old charts    MEDICATIONS ORDERED IN ED: Medications  propofol (DIPRIVAN) 1000 MG/100ML infusion (has no administration in time range)     IMPRESSION / MDM / ASSESSMENT AND PLAN / ED COURSE  I reviewed the triage vital signs and the nursing notes.   Patient's presentation is most consistent with acute presentation with potential threat to life or bodily function.   Patient comes in with cardiac arrest since patient had substantial bleeding noted secretions noted from her i gel so was converted to an ET tube.  She was initially PEA and I discussed with family about given prolonged time of CPR of 40 minutes when she then went into Vfib.Marland Kitchen  She has had glucose given,  calcium, epi, bicarbonate.  There is no known trauma to suggest pneumothorax, bedside ultrasound without signs of large effusion.  On recheck patient had 1 episode of V-fib that she was defibrillated and given a dose of Amio for and then on repeat check she was in a sinus bradycardia.  She was given Levophed and atropine and heart rate did go up looking more like an  A-fib a flutter.  She was on 10 of PEEP to help with oxygenation and 100% FiO2 and still with sats in the low 90s.  She was suctioned multiple times with significant bloody secretions coming out therefore patient was not a candidate for any type of tPA if this was a PE or heart attack.  There is also potential this could have been a hemorrhagic stroke given some report of facial droop? Although she had try to see urgent care for SOB so I wonder if could be PE.  CT imaging was ordered to evaluate for potential causes of cardiac arrest.  I discussed the case with the ICU team for admission although CTs were pending at time of this discussion patient will require ICU for further workup and care.  ABG shows low pH.  Thyroid was normal.  CMP shows normal potassium.   6:15 PM given patient is a dialysis patient over a year we had will hold off on waiting for labs for CT imaging.  The patient is on the cardiac monitor to evaluate for evidence of arrhythmia and/or significant heart rate changes.      FINAL CLINICAL IMPRESSION(S) / ED DIAGNOSES   Final diagnoses:  Cardiac arrest (HCC)  ESRD (end stage renal disease) on dialysis [facility])     Rx / DC Orders   ED Discharge Orders     None        Note:  This document was prepared using Dragon voice recognition software and may include unintentional dictation errors.   Concha Se, MD 06/26/2023 [phone]

## 2023-07-15 NOTE — ED Notes (Signed)
Bicarb given

## 2023-07-15 NOTE — Progress Notes (Signed)
Patient's belongings sent home with family including patient's prosthetic legs and jewelry.

## 2023-07-15 NOTE — Telephone Encounter (Signed)
Patient informed. Said she is taking sertraline and will continue to take this daily.  - Cindy Osborne

## 2023-07-15 NOTE — Progress Notes (Signed)
Initially met with sister of pt in ED Family room -offered compassionate presence and listening ear. Offered prayer over her and pt-pt had slight pulse but when moved to CT and then ICU pt coded again- Family was in ICU waiting room and was informed by PA on, Decision was made to not move forward with Compressions should her heart stop again and let her pass. Pt passed a little after 7:30 pm with sister, mom and nephew by bedside. Offered continued prayer and support during this time. Family appreciative of staff and chaplain care.

## 2023-07-15 NOTE — Progress Notes (Signed)
Patient passed away at 19:35.

## 2023-07-15 NOTE — ED Notes (Signed)
Onto CT table w/o change, breathing above vent and biting tube occasionally, mildly restless

## 2023-07-15 NOTE — ED Notes (Signed)
EPI given(1)

## 2023-07-15 NOTE — ED Notes (Signed)
ETT placed, color change noted, 25cm at the lip

## 2023-07-15 NOTE — ED Notes (Signed)
POCUS: no pulses, no cardiac motility, no signs of life, v-fib noted on monitor.

## 2023-07-15 NOTE — ED Notes (Signed)
EPI given (7)

## 2023-07-15 NOTE — ED Notes (Signed)
Pulse check, no pulses, PEA, EPI given (5)

## 2023-07-15 NOTE — ED Notes (Signed)
Calcium given

## 2023-07-15 NOTE — ED Notes (Signed)
No pulse, PEA, amiodarone 150mg  given

## 2023-07-15 NOTE — ED Notes (Signed)
Xray at [facility]. PCCM arrives to [facility].

## 2023-07-15 NOTE — ED Notes (Signed)
EKG completed

## 2023-07-15 NOTE — ED Notes (Signed)
PEA, no pulses, ETT attempted. CPR LUCAS applied/ continues. IV attempted, unsuccessful.

## 2023-07-15 NOTE — ED Notes (Signed)
EPI given (3)

## 2023-07-15 NOTE — ED Notes (Signed)
LUCAS CPR continues, bloody secretions.

## 2023-07-15 NOTE — ED Notes (Signed)
Atropine given 

## 2023-07-15 NOTE — H&P (Signed)
NAME:  Cindy Osborne, MRN:  [id number], DOB:  [birth year], LOS: 0 ADMISSION DATE:  07/11/2023, CONSULTATION DATE: 06/29/2023 REFERRING MD: Dr. Fuller Plan, CHIEF COMPLAINT: Cardiac Arrest    History of Present Illness:  This is a 57 yo female who presented to [facility] ER via EMS on 11/5 from [facility] following a cardiac arrest.  It was reported following completion of hemodialysis pt developed right-sided facial droop prompting EMS notification.  During EMS transport pt became unresponsive and pulseless.  ACLS protocol initiated per EMS lucas device placed and pt received: 3 amps of epi, 1 amp of bicarb and 1 amp of calcium.  An LMA/I-gel airway and left tibial I/O placed per EMS.    ED Course En route to the ER pt had a total of 20 minutes of CPR without ROSC.  When she arrived in the ER compressions were in progress utilizing lucas device, however pt remained in PEA.  Pt noted to have a large amount of bloody secretions from the oropharynx.  She remained pulseless Lucas device removed and manual CPR initiated.  She received multiple rounds of epinephrine/ 1 amp of calcium/1 amp of bicarb requiring mechanical intubation.  Estimated total downtime 40 to 60 minutes prior to ROSC.  Post arrest cardiac rhythm atrial fibrillation with rvr hr 128-138.  Pt required levophed gtt @10  mcg/min due to hypotension.  PCCM team contacted for ICU admission.    Pertinent  Medical History  Acute Ischemic Right MCA Stroke secondary to Right M1 Occlusion for which she received TNK and Mechanical Thrombectomy (01/2022) Recurrent Right MCA Stroke (03/2022) Atrial Fibrillation with RVR  Type II Diabetes Mellitus  ESRD on HD (T-TH-Sat) Bilateral BKA PAD  HTN HLD Moderate Mitral Regurgitation  Recurrent Right Pleural Effusion  HFrEF (Echo 01/22/2023 EF 25%; inferior/posterior akinesis; mild mitral valve regurgitation)  Significant Hospital Events: Including procedures, antibiotic start and stop dates in addition  to other pertinent events   11/5: Pt admitted post cardiac arrest mechanically intubated.  Upon arrival to ICU pt cardiac arrested (V.fib/V.tach) requiring defibrillation at 200 J's, 1 amp of epinephrine, and 1 amp of sodium bicarb prior to ROSC  Interim History / Subjective:  Pt requiring levophed gtt @15  mcg/min to maintain map 65 or higher.  She is currently breathing over the ventilator   Objective   Blood pressure 98/79, resp. rate 18, weight 95.4 kg, SpO2 90%.    Vent Mode: PRVC FiO2 (%):  [100 %] 100 % Set Rate:  [18 bmp] 18 bmp Vt Set:  [40 mL] 40 mL PEEP:  [10 cmH20] 10 cmH20  No intake or output data in the 24 hours ending 06/28/2023 1808 Filed Weights   06/20/2023 1737  Weight: 95.4 kg    Examination: General: Acute on chronically-ill appearing female, mild respiratory distress mechanically intubated HENT: Supple, no JVD  Lungs: Faint rhonchi throughout, mild respiratory distress  Cardiovascular: Irregular irregular, no r/g, 2+ radial/1+ popliteal pulses, no edema  Abdomen: +BS x4, obese, soft, non distended  Extremities: Bilateral BKA's Neuro: Unresponsive, not withdrawing from stimulation, gag/corneal reflexes present, bilateral pupils 2 mm very sluggish  GU: Deferred   Resolved Hospital Problem list     Assessment & Plan:   #Acute metabolic encephalopathy although concerning for possible anoxic injury post cardiac arrest  #Mechanical intubation pain/discomfort  Hx: Acute ischemic right MCA stroke x2 - Correct metabolic derangements  - Avoid sedating medications as able  - Maintain RASS goal 0  - PAD protocol to maintain RASS goal: prn fentanyl  -  Stat CT Head pending  - EEG pending - Urine drug screen pending   #Cardiac arrest of unknown etiology  #HFrEF #Cardiogenic shock  #Atrial fibrillation with rvr  Hx: HLD and HTN - Continuous telemetry monitoring  - Trend troponin's  - CTA Chest to r/o PE  - Levophed gtt to maintain map 55 or higher  - Hold  outpatient torsemide   #Acute hypoxic respiratory failure  #Possible aspiration  #Possible pulmonary hemorrhage  #Mechanical intubation  - Full vent support for now: vent settings reviewed and established  - Continue lung protective strategies  - Maintain plateau pressures less than 30 cm H2O - VAP bundle implemented  - Intermittent CXR's and ABG's   #ESRD on HD (T-Th-Sat) #Metabolic acidosis  - Trend BMP - Replace electrolytes as indicated  - Strict I's and O's - Will consult nephrology   #Type II Diabetes Mellitus  - CBG's q4hrs  - SSI  - Follow hypo/hyperglycemic protocol    Pt High Risk FOR Cardiac Arrest and Sudden Death.  Discussed poor prognosis with family and goals of care.  Pts POA Susy Frizzle changed pts code status to DO NOT RESUSCITATE.   Best Practice (right click and "Reselect all SmartList Selections" daily)   Diet/type: NPO DVT prophylaxis: Contraindicated due to active bleeding  GI prophylaxis: PPI Lines: N/A Foley:  N/A Code Status:  DNR Last date of multidisciplinary goals of care discussion [N/A]  Pts mother, sister, and POA Susy Frizzle updated extensively regarding pts condition and poor prognosis  Labs   CBC: No results for input(s): "WBC", "NEUTROABS", "HGB", "HCT", "MCV", "PLT" in the last 168 hours.  Basic Metabolic Panel: No results for input(s): "NA", "K", "CL", "CO2", "GLUCOSE", "BUN", "CREATININE", "CALCIUM", "MG", "PHOS" in the last 168 hours. GFR: CrCl cannot be calculated (Patient's most recent lab result is older than the maximum 21 days allowed.). No results for input(s): "PROCALCITON", "WBC", "LATICACIDVEN" in the last 168 hours.  Liver Function Tests: No results for input(s): "AST", "ALT", "ALKPHOS", "BILITOT", "PROT", "ALBUMIN" in the last 168 hours. No results for input(s): "LIPASE", "AMYLASE" in the last 168 hours. No results for input(s): "AMMONIA" in the last 168 hours.  ABG    Component Value Date/Time   PHART  7.19 (LL) 06/30/2023 1753   PCO2ART 49 (H) 07/04/2023 1753   PO2ART 98 06/23/2023 1753   HCO3 18.7 (L) 06/30/2023 1753   TCO2 28 05/27/2022 1657   ACIDBASEDEF 9.6 (H) 06/17/2023 1753   O2SAT 98 06/22/2023 1753     Coagulation Profile: No results for input(s): "INR", "PROTIME" in the last 168 hours.  Cardiac Enzymes: No results for input(s): "CKTOTAL", "CKMB", "CKMBINDEX", "TROPONINI" in the last 168 hours.  HbA1C: Hemoglobin A1C  Date/Time Value Ref Range Status  08/30/2021 12:00 AM 6.0  Final  12/30/2020 12:00 AM 6.3  Final   Hgb A1c MFr Bld  Date/Time Value Ref Range Status  01/01/2023 02:13 PM 5.3 4.8 - 5.6 % Final    Comment:    (NOTE) Pre diabetes:          5.7%-6.4%  Diabetes:              >6.4%  Glycemic control for   <7.0% adults with diabetes   06/01/2022 05:17 AM 4.9 4.8 - 5.6 % Final    Comment:    (NOTE) Pre diabetes:          5.7%-6.4%  Diabetes:              >6.4%  Glycemic control for   <7.0% adults with diabetes     CBG: Recent Labs  Lab 06/16/2023 1630  GLUCAP 87    Review of Systems:   Unable to assess pt mechanically intubated   Past Medical History:  She,  has a past medical history of Acute ischemic right MCA stroke (HCC) (03/25/2022), Amputated toe (HCC) (01/31/2012), Diabetes mellitus without complication (HCC), ESRD (end stage renal disease) (HCC), HFrEF (heart failure with reduced ejection fraction) (HCC), History of diabetic ulcer of foot (04/01/2014), Hyperlipidemia, Hypertension, Moderate mitral regurgitation, PAD (peripheral artery disease) (HCC), Recurrent right pleural effusion, Stroke (cerebrum) (HCC) (03/21/2022), and Stroke (HCC) (01/21/2022).   Surgical History:   Past Surgical History:  Procedure Laterality Date   angioplasty politeal Left 07/2020   avg for dialysis     CHOLECYSTECTOMY     COLONOSCOPY  08/15/2011   cleared for 10 yrs   IR CT HEAD LTD  01/21/2022   IR CT HEAD LTD  03/21/2022   IR PERCUTANEOUS ART  THROMBECTOMY/INFUSION INTRACRANIAL INC DIAG ANGIO  01/21/2022   IR PERCUTANEOUS ART THROMBECTOMY/INFUSION INTRACRANIAL INC DIAG ANGIO  03/21/2022   IR US GUIDE VASC ACCESS RIGHT  01/21/2022   IR US GUIDE VASC ACCESS RIGHT  03/21/2022   LEG AMPUTATION BELOW KNEE Right 04/2019   LEG AMPUTATION BELOW KNEE Left 09/08/2020   RADIOLOGY WITH ANESTHESIA N/A 01/21/2022   Procedure: IR WITH ANESTHESIA;  Surgeon: Radiologist, Medication, MD;  Location: [facility] OR;  Service: Radiology;  Laterality: N/A;   RADIOLOGY WITH ANESTHESIA N/A 03/21/2022   Procedure: IR WITH ANESTHESIA;  Surgeon: Radiologist, Medication, MD;  Location: [facility] OR;  Service: Radiology;  Laterality: N/A;   TOE AMPUTATION Right 10/15/2018     Social History:   reports that she has never smoked. She has never used smokeless tobacco. She reports current alcohol use. She reports that she does not use drugs.   Family History:  Her family history includes Diabetes in her mother and sister.   Allergies Allergies  Allergen Reactions   Augmentin [Amoxicillin-Pot Clavulanate] Shortness Of Breath   Atorvastatin Other (See Comments)    Back pain   Pioglitazone Other (See Comments)    Edema   Peanut-Containing Drug Products Itching   Rosuvastatin Itching and Other (See Comments)    Back pain   Shellfish-Derived Products Itching and Rash    shrimp   Sulfa Antibiotics Rash and Other (See Comments)    Shedding of skin     Home Medications  Prior to Admission medications   Medication Sig Start Date End Date Taking? Authorizing Provider  albuterol (VENTOLIN HFA) 108 (90 Base) MCG/ACT inhaler Inhale 2 puffs into the lungs every 6 (six) hours as needed.    [provider]  ALPRAZolam Prudy Feeler) 0.25 MG tablet Take 1 tablet (0.25 mg total) by mouth at bedtime as needed for anxiety. 06/13/23   Reubin Milan, MD  apixaban (ELIQUIS) 2.5 MG TABS tablet Take 1 tablet (2.5 mg total) by mouth 2 (two) times daily. 05/09/23   Reubin Milan, MD   Cholecalciferol (VITAMIN D-1000 MAX ST) 25 MCG (1000 UT) tablet Take by mouth.    [provider]  clopidogrel (PLAVIX) 75 MG tablet Take 1 tablet (75 mg total) by mouth daily. 05/09/23   Reubin Milan, MD  Doxercalciferol (HECTOROL IV) Doxercalciferol (Hectorol) 12/28/22 12/27/23  [provider]  ENTRESTO 24-26 MG Take 1 tablet by mouth 2 (two) times daily. 05/09/23   Reubin Milan, MD  glucose blood  test strip     [provider]  hydrOXYzine (ATARAX) 10 MG tablet Take 1 tablet (10 mg total) by mouth every 8 (eight) hours as needed. 05/09/23   Reubin Milan, MD  lidocaine (LIDODERM) 5 % Place 1 patch onto the skin daily. Remove & Discard patch within 12 hours or as directed by MD 05/09/23   Reubin Milan, MD  midodrine (PROAMATINE) 10 MG tablet Take 10 mg by mouth 3 (three) times daily.    [provider]  multivitamin (RENA-VIT) TABS tablet Take 1 tablet by mouth at bedtime. 01/20/23   Arnetha Courser, MD  REPATHA SURECLICK 140 MG/ML SOAJ Inject 140 mg into the skin every 14 (fourteen) days. 05/09/23   Reubin Milan, MD  sertraline (ZOLOFT) 50 MG tablet Take 1 tablet (50 mg total) by mouth daily. 06/06/23   Reubin Milan, MD  sucroferric oxyhydroxide (VELPHORO) 500 MG chewable tablet Chew 1 tablet (500 mg total) by mouth 3 (three) times daily with meals. 05/09/23   Reubin Milan, MD  torsemide [facility]) 20 MG tablet Take 1 tablet (20 mg total) by mouth every Monday, Wednesday, and Friday. 05/09/23   Reubin Milan, MD  traZODone (DESYREL) 50 MG tablet Take 1 tablet (50 mg total) by mouth at bedtime as needed. for sleep 05/09/23   Reubin Milan, MD     Critical care time: 41 minutes     Zada Girt, [facility]  Pulmonary/Critical Care Pager [phone] (please enter 7 digits) PCCM Consult Pager [phone] (please enter 7 digits)

## 2023-07-15 NOTE — Death Summary Note (Signed)
DEATH SUMMARY   Patient Details  Name: Cindy Osborne MRN: [id number] DOB: Jun 05, 1966  Admission/Discharge Information   Admit Date:  07/18/2023  Date of Death:  2023/07/18  Time of Death:  19:35  Length of Stay: 0  Referring Physician: Reubin Milan, MD   Reason(s) for Hospitalization  Out of hospital cardiac arrest  Diagnoses  Preliminary cause of death: Acute HFrEF & massive MI Secondary Diagnoses (including complications and co-morbidities):  Active Problems:   End stage renal failure on dialysis (HCC)   T2DM (type 2 diabetes mellitus) (HCC)   Metabolic acidosis   Acute hypoxic respiratory failure (HCC)   Cardiac arrest, cause unspecified (HCC)   Acute metabolic encephalopathy   Cardiogenic shock (HCC)   HFrEF (heart failure with reduced ejection fraction) (HCC)   Atrial fibrillation with RVR (HCC)   On mechanically assisted ventilation [facility])   Brief Hospital Course (including significant findings, care, treatment, and services provided and events leading to death)  Eadie Repetto is a 57 y.o. year old female who who presented to [facility] ER via EMS on Jul 18, 2023 from [facility] following a cardiac arrest.  It was reported following completion of hemodialysis pt developed right-sided facial droop prompting EMS notification.  During EMS transport pt became unresponsive and pulseless.  ACLS protocol initiated per EMS lucas device placed and pt received: 3 amps of epi, 1 amp of bicarb and 1 amp of calcium.  An LMA/I-gel airway and left tibial I/O placed per EMS.     ED Course En route to the ER pt had a total of 20 minutes of CPR without ROSC.  When she arrived in the ER compressions were in progress utilizing lucas device, however pt remained in PEA.  Pt noted to have a large amount of bloody secretions from the oropharynx.  She remained pulseless Lucas device removed and manual CPR initiated.  She received multiple rounds of epinephrine/ 1 amp of calcium/1 amp of bicarb  requiring mechanical intubation.  Estimated total downtime 40 to 60 minutes prior to ROSC.  Post arrest cardiac rhythm atrial fibrillation with rvr hr 128-138.  Pt required levophed gtt @10  mcg/min due to hypotension.  PCCM team contacted for ICU admission.   18-Jul-2023: Pt admitted post cardiac arrest mechanically intubated.  Upon arrival to ICU pt cardiac arrested (V.fib/V.tach) requiring defibrillation at 200 J's, 1 amp of epinephrine, and 1 amp of sodium bicarb prior to ROSC. During transport to ICU patient V-fib arrested for a total of 6 minutes prior to ROSC. Discussion with family and decision made to change CODE status to DNR/DNI and stop any further ACLS. Family arrived bedside and the patient passed within the next 30 minutes.   Pertinent Labs and Studies  Significant Diagnostic Studies No results found.  Microbiology No results found for this or any previous visit (from the past 240 hour(s)).  Lab Basic Metabolic Panel: Recent Labs  Lab 18-Jul-2023 1748  NA 140  K 3.3*  CL 100  CO2 23  GLUCOSE 276*  BUN 11  CREATININE 3.87*  CALCIUM 9.6  MG 2.2   Liver Function Tests: Recent Labs  Lab 07-18-2023 1748  AST 159*  ALT 76*  ALKPHOS 89  BILITOT 1.8*  PROT 5.2*  ALBUMIN 2.6*   No results for input(s): "LIPASE", "AMYLASE" in the last 168 hours. No results for input(s): "AMMONIA" in the last 168 hours. CBC: Recent Labs  Lab 07-18-23 1748  WBC 22.3*  NEUTROABS 13.9*  HGB 9.6*  HCT 32.5*  MCV 103.8*  PLT 60*   Cardiac Enzymes: No results for input(s): "CKTOTAL", "CKMB", "CKMBINDEX", "TROPONINI" in the last 168 hours. Sepsis Labs: Recent Labs  Lab 07/09/2023 1748  WBC 22.3*  LATICACIDVEN >9.0*    Procedures/Operations  07/08/2023: ETT placement 07/02/2023: IO placement   Haja Crego L Rust-Chester 06/25/2023, 7:47 PM  Cheryll Cockayne Rust-Chester, AGACNP-BC Acute Care Nurse Practitioner [facility]   [phone] / [phone] Please see Amion for  details.

## 2023-07-15 NOTE — ED Notes (Signed)
Pt rolled, pulse check, no pulses, CPR re-started.

## 2023-07-15 NOTE — ED Notes (Signed)
EPI given (2)

## 2023-07-15 NOTE — ED Notes (Signed)
Cindy Osborne CPR continues

## 2023-07-15 NOTE — ED Notes (Signed)
RT at BS for ABG ?

## 2023-07-15 NOTE — Telephone Encounter (Signed)
  Chief Complaint: SOB with activity Symptoms: no SOB at rest Frequency: 4 weeks Pertinent Negatives: Patient denies chest pain or dizziness Disposition: [] ED /[] Urgent Care (no appt availability in office) / [x] Appointment(In office/virtual)/ []  [place] [facility]/ [] Home Care/ [] Refused Recommended Disposition /[] [place] [facility]/ []  Follow-up with PCP Additional Notes: routing to office to schedule pt wants appt Wednesday Reason for Disposition  [1] MILD difficulty breathing (e.g., minimal/no SOB at rest, SOB with walking, pulse <100) AND [2] NEW-onset or WORSE than normal  Answer Assessment - Initial Assessment Questions 1. RESPIRATORY STATUS: "Describe your breathing?" (e.g., wheezing, shortness of breath, unable to speak, severe coughing)      SOB with walking 2. ONSET: "When did this breathing problem begin?"      4 weeks ago 3. PATTERN "Does the difficult breathing come and go, or has it been constant since it started?"      Only with activity 4. SEVERITY: "How bad is your breathing?" (e.g., mild, moderate, severe)    - MILD: No SOB at rest, mild SOB with walking, speaks normally in sentences, can lie down, no retractions, pulse < 100.    - MODERATE: SOB at rest, SOB with minimal exertion and prefers to sit, cannot lie down flat, speaks in phrases, mild retractions, audible wheezing, pulse 100-120.    - SEVERE: Very SOB at rest, speaks in single words, struggling to breathe, sitting hunched forward, retractions, pulse > 120      mild 6. CARDIAC HISTORY: "Do you have any history of heart disease?" (e.g., heart attack, angina, bypass surgery, angioplasty)      yes 8. CAUSE: "What do you think is causing the breathing problem?"      Unsure is taking all her meds  9. OTHER SYMPTOMS: "Do you have any other symptoms? (e.g., dizziness, runny nose, cough, chest pain, fever)     fatigue 10. O2 SATURATION MONITOR:  "Do you use an oxygen saturation monitor (pulse oximeter) at  home?" If Yes, ask: "What is your reading (oxygen level) today?" "What is your usual oxygen saturation reading?" (e.g., 95%)       94 when last checked  Protocols used: Breathing Difficulty-A-AH

## 2023-07-15 NOTE — ED Notes (Signed)
Pulseless, no carotid pulse, LUCAS removed, manual CPR begun

## 2023-07-15 NOTE — ED Notes (Signed)
RT at [facility], VSS, no changes, to CT on monitor and vent.

## 2023-07-15 NOTE — ED Notes (Signed)
No pulse, carotid or femoral, CPR continues

## 2023-07-15 NOTE — Progress Notes (Signed)
Attempted to get report from ED 

## 2023-07-15 NOTE — Progress Notes (Signed)
Report received from ED. Patient being transported to CT then to ICU.

## 2023-07-15 NOTE — ED Notes (Signed)
R AC IV established

## 2023-07-15 NOTE — ED Notes (Signed)
CBG 87, Dextrose D50, given

## 2023-07-15 NOTE — Progress Notes (Signed)
See code blue sheet for full code record, but ICU attending ended code per discussion with patient's family. Patient did have pulse at that time. Per ICU attending, plan to continue measures currently in place like ventilator and levophed infusion but no further resuscitation or escalation. Family brought back to bedside with chaplain to be with patient.

## 2023-07-15 NOTE — ED Notes (Signed)
Obtaining labs at this time.

## 2023-07-15 DEATH — deceased

## 2023-07-18 ENCOUNTER — Ambulatory Visit: Payer: Federal, State, Local not specified - PPO | Admitting: Internal Medicine

## 2023-07-30 ENCOUNTER — Telehealth: Payer: Self-pay | Admitting: Internal Medicine

## 2023-07-30 ENCOUNTER — Ambulatory Visit: Payer: Medicare Other | Admitting: Cardiology

## 2023-07-30 NOTE — Telephone Encounter (Signed)
Copied from CRM [id number]. Topic: General - Other >> Jul 30, 2023 12:29 PM Marlow Baars wrote: Reason for CRM: The patients niece, Susy Frizzle called in because she says the [facility] has gotten the wrong date of death on the death certificate and it needs to be corrected and signed by the provider. The correct date is 23-Jun-2023 and the [facility] has 06/22/23 which is not correct. Lanier Prude will be calling back after 1 to talk with someone as she needs this as soon as possible. >> Jul 30, 2023  1:11 PM De Blanch wrote: Pt niece is calling back to f/u. Stated Dr. Judithann Graves signed the patient's death certificate; however, the date is incorrect, signed as 06-21-2024; the patient passed away on the 5th.  Please advise.

## 2023-07-30 NOTE — Telephone Encounter (Signed)
Myer Peer informed death certificate has been amended.   - Tamyah Cutbirth

## 2023-08-03 ENCOUNTER — Other Ambulatory Visit: Payer: Self-pay | Admitting: Internal Medicine

## 2023-08-03 DIAGNOSIS — E1169 Type 2 diabetes mellitus with other specified complication: Secondary | ICD-10-CM

## 2023-08-09 ENCOUNTER — Other Ambulatory Visit: Payer: Self-pay | Admitting: Internal Medicine

## 2023-08-09 DIAGNOSIS — Z8673 Personal history of transient ischemic attack (TIA), and cerebral infarction without residual deficits: Secondary | ICD-10-CM

## 2023-08-09 DIAGNOSIS — E1169 Type 2 diabetes mellitus with other specified complication: Secondary | ICD-10-CM

## 2023-08-09 DIAGNOSIS — I420 Dilated cardiomyopathy: Secondary | ICD-10-CM

## 2023-08-17 ENCOUNTER — Ambulatory Visit: Payer: Medicare Other | Admitting: Cardiology

## 2024-01-27 IMAGING — MR MR MRA HEAD W/O CM
1 series · 19 of 48 positions shown · non-contrast
Comparison: Head CT and CTA from yesterday.

CLINICAL DATA: Stroke follow-up. Acute onset left-sided weakness
and sensory deficit at dialysis appointment

EXAM:
MRI HEAD WITHOUT CONTRAST
MRA HEAD WITHOUT CONTRAST
TECHNIQUE: Multiplanar, multi-echo pulse sequences of the brain and surrounding
structures were acquired without intravenous contrast. Angiographic
images of the Circle of Willis were acquired using MRA technique
without intravenous contrast.

[Series 5: 3d cow · axial · 0.5mm · 0.41mm/px · z∈[-82,-2]mm · 19 of 172 slices shown]
[im 1/172]
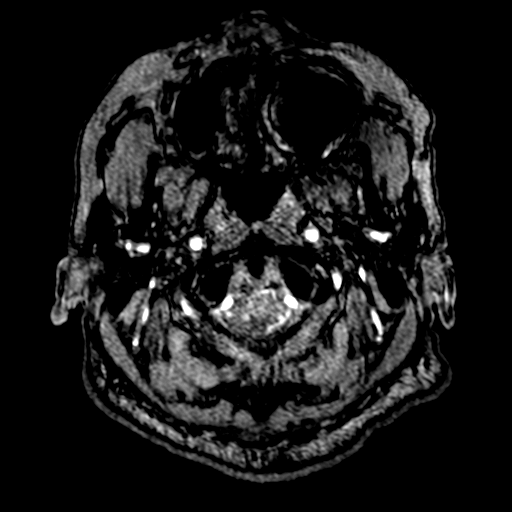
[im 4/172]
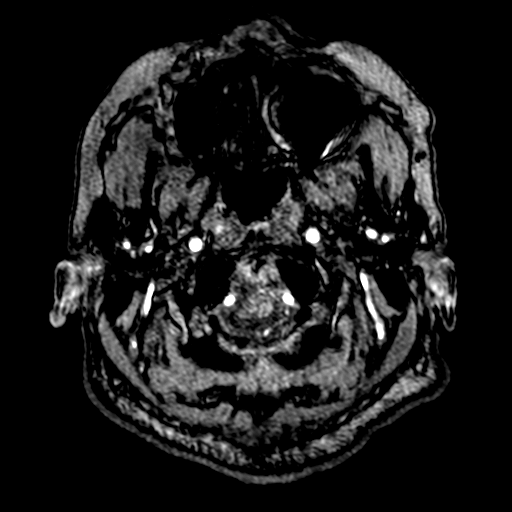
[im 8/172]
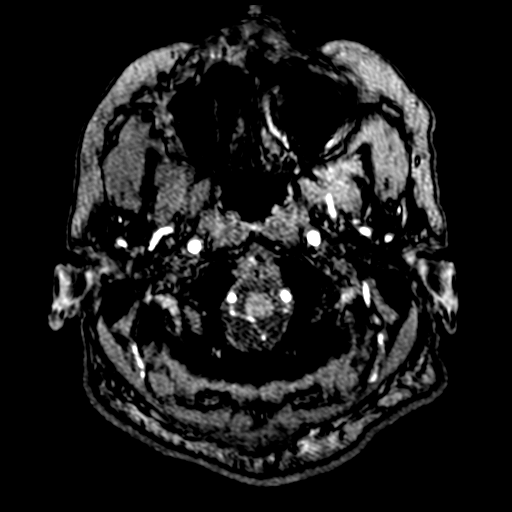
[im 11/172]
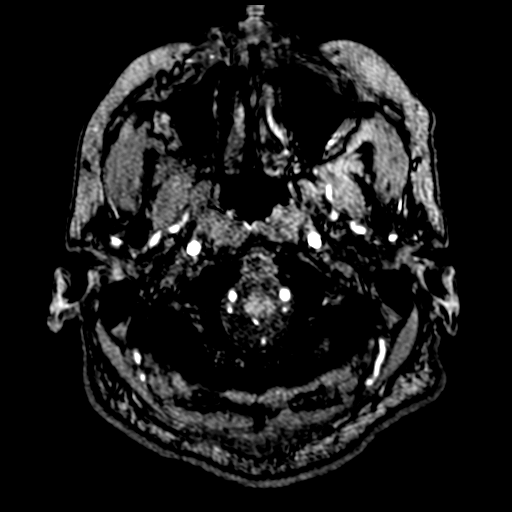
[im 15/172]
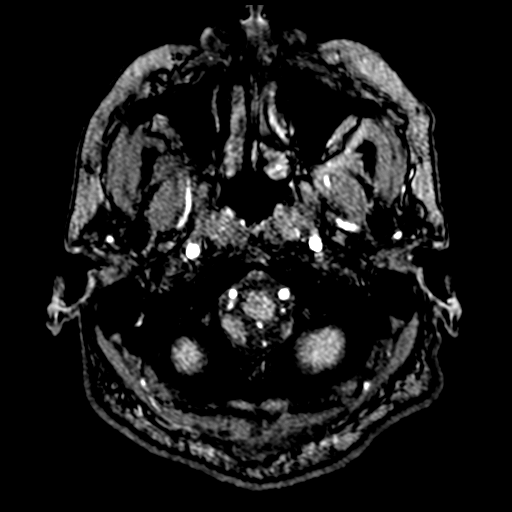
[im 19/172]
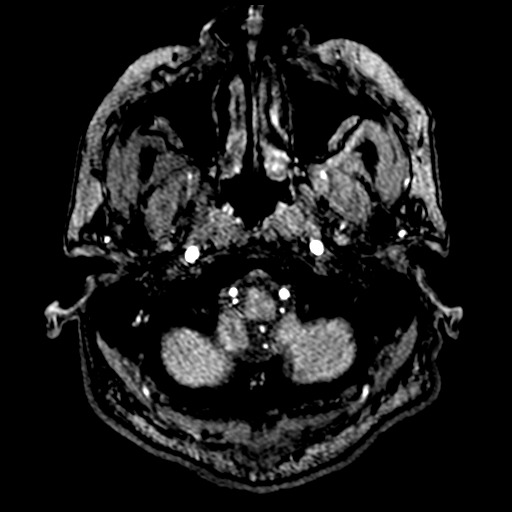
[im 22/172]
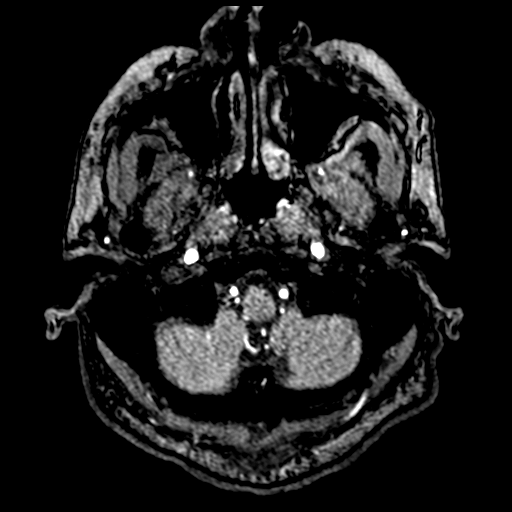
[im 26/172]
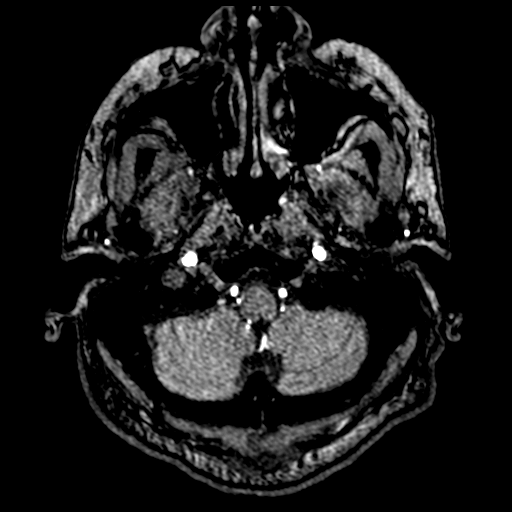
[im 30/172]
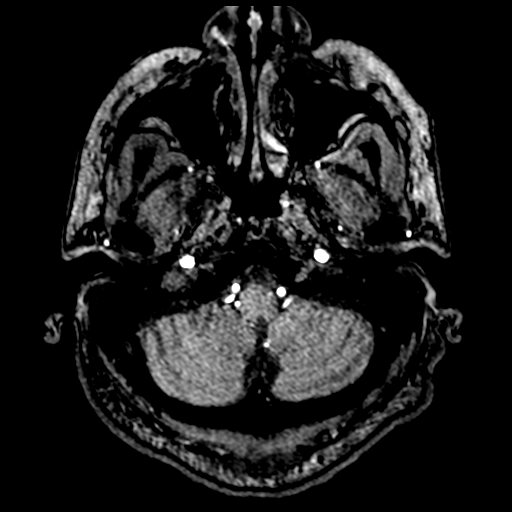
[im 33/172]
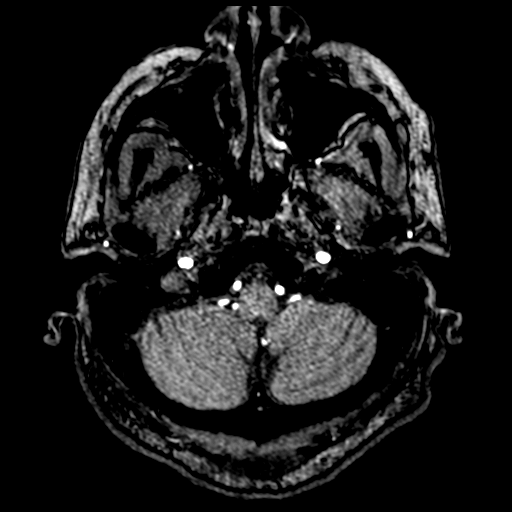
[im 37/172]
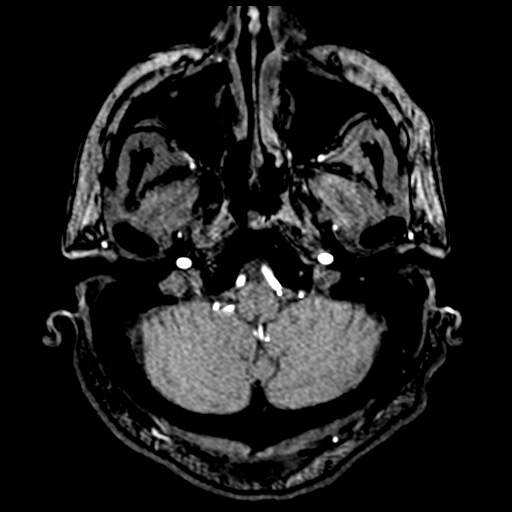
[im 55/172]
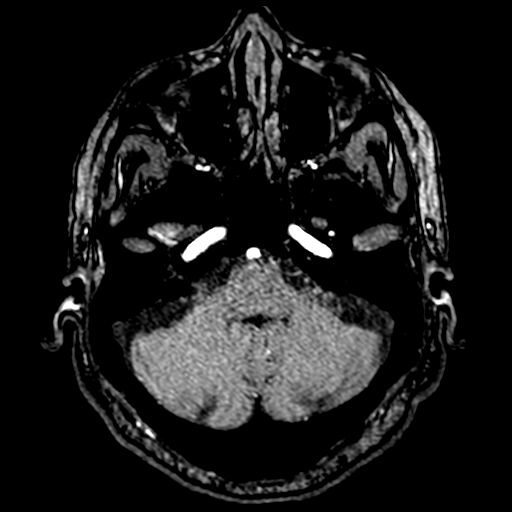
[im 77/172]
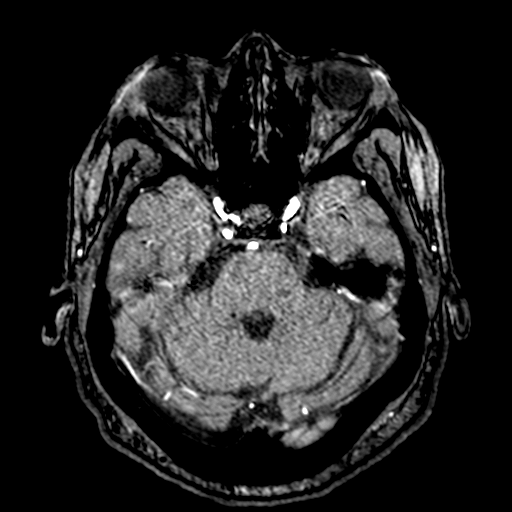
[im 88/172]
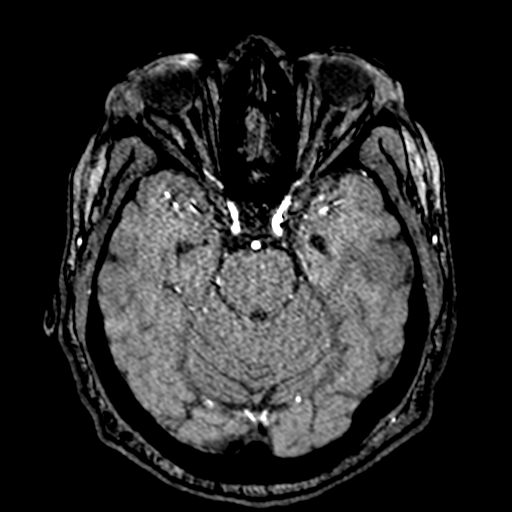
[im 99/172]
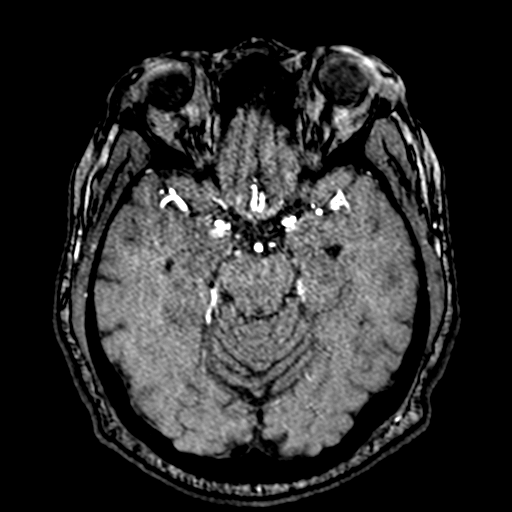
[im 121/172]
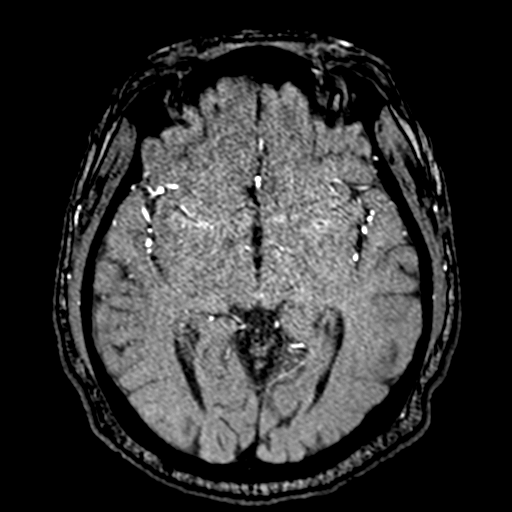
[im 142/172]
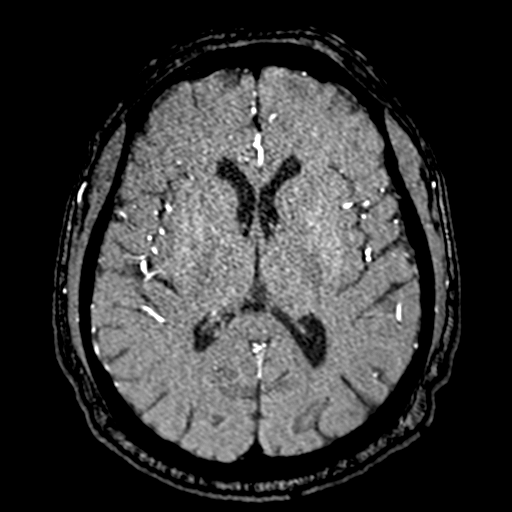
[im 146/172]
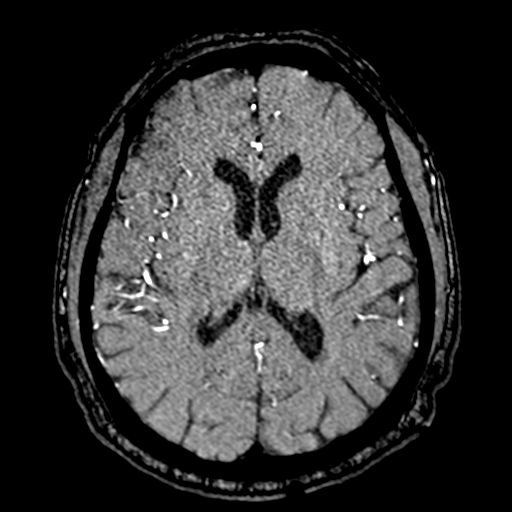
[im 164/172]
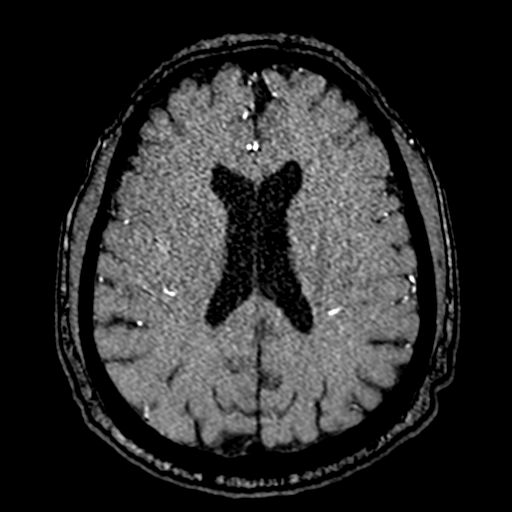

[19 of 48 positions shown; findings below may reference images not displayed]

FINDINGS: MRI HEAD FINDINGS

Brain: Scattered areas of acute cortical infarction in the right
insula, posterior temporal, and frontal cortex, overall small
volume. Mild white matter infarction in the right occipital parietal
region. No acute hemorrhage, hydrocephalus, or collection. Left
parietal white matter FLAIR hyperintensity likely from old ischemic
insult.

Vascular: Arterial findings below.

Skull and upper cervical spine: Normal marrow signal

Sinuses/Orbits: Negative

MRA HEAD FINDINGS

Anterior circulation: Atheromatous irregularity of the carotid
siphons. Re-established flow in the right MCA at site of previously
seen embolism. No downstream or contralateral embolic disease noted.
Negative for aneurysm.

Posterior circulation: Codominant vertebral arteries. The vertebral
and basilar arteries are smoothly contoured and widely patent. No
branch occlusion, beading, or aneurysm
IMPRESSION: Brain MRI:

Small areas of acute infarction in the right MCA distribution,
primarily cortical and limited in extent when compared to the level
of prior occlusion.

MRA:

1. Normalized appearance of the right MCA bifurcation after
embolectomy. No evidence of interval embolic disease.
2. Atheromatous irregularity of the carotid siphons.
